# Patient Record
Sex: Female | Born: 1945
Health system: Southern US, Community
[De-identification: ages and names within clinical notes are randomized; demographics above are authoritative.]

## PROBLEM LIST (undated history)

## (undated) DIAGNOSIS — F32A Depression, unspecified: Secondary | ICD-10-CM

## (undated) DIAGNOSIS — H5440 Blindness, one eye, unspecified eye: Secondary | ICD-10-CM

## (undated) DIAGNOSIS — I10 Essential (primary) hypertension: Secondary | ICD-10-CM

## (undated) DIAGNOSIS — D649 Anemia, unspecified: Secondary | ICD-10-CM

## (undated) DIAGNOSIS — I6529 Occlusion and stenosis of unspecified carotid artery: Secondary | ICD-10-CM

## (undated) DIAGNOSIS — H269 Unspecified cataract: Secondary | ICD-10-CM

## (undated) DIAGNOSIS — I639 Cerebral infarction, unspecified: Secondary | ICD-10-CM

## (undated) DIAGNOSIS — I469 Cardiac arrest, cause unspecified: Secondary | ICD-10-CM

## (undated) DIAGNOSIS — E119 Type 2 diabetes mellitus without complications: Secondary | ICD-10-CM

## (undated) DIAGNOSIS — R0902 Hypoxemia: Secondary | ICD-10-CM

## (undated) DIAGNOSIS — E785 Hyperlipidemia, unspecified: Secondary | ICD-10-CM

## (undated) DIAGNOSIS — T7840XA Allergy, unspecified, initial encounter: Secondary | ICD-10-CM

## (undated) HISTORY — DX: Depression, unspecified: F32.A

## (undated) HISTORY — DX: Unspecified cataract: H26.9

## (undated) HISTORY — PX: ABDOMINAL HYSTERECTOMY: SHX81

## (undated) HISTORY — DX: Occlusion and stenosis of unspecified carotid artery: I65.29

## (undated) HISTORY — DX: Allergy, unspecified, initial encounter: T78.40XA

## (undated) HISTORY — DX: Hypoxemia: R09.02

## (undated) HISTORY — DX: Hyperlipidemia, unspecified: E78.5

## (undated) HISTORY — DX: Anemia, unspecified: D64.9

## (undated) NOTE — *Deleted (*Deleted)
Patient alert, oriented, denies pain or discomfort, daughter at bedside,,patient anticipates surgical procedure on 12/02/19, per chart Left carotid Endarectomy

## (undated) NOTE — *Deleted (*Deleted)
Prior to transfer to OR report given to [facility], CRNA

## (undated) NOTE — *Deleted (*Deleted)
Notified by Pricilla Handler, pre update information prior to surgical tech arrival

---

## 2002-03-14 ENCOUNTER — Emergency Department (HOSPITAL_COMMUNITY): Admission: EM | Admit: 2002-03-14 | Discharge: 2002-03-14 | Payer: Self-pay | Admitting: Emergency Medicine

## 2002-03-15 ENCOUNTER — Emergency Department (HOSPITAL_COMMUNITY): Admission: EM | Admit: 2002-03-15 | Discharge: 2002-03-15 | Payer: Self-pay | Admitting: Emergency Medicine

## 2005-05-06 ENCOUNTER — Ambulatory Visit: Payer: Self-pay | Admitting: Cardiovascular Disease

## 2005-06-04 ENCOUNTER — Other Ambulatory Visit: Admission: RE | Admit: 2005-06-04 | Discharge: 2005-06-04 | Payer: Self-pay | Admitting: Family Medicine

## 2005-09-24 ENCOUNTER — Emergency Department (HOSPITAL_COMMUNITY): Admission: EM | Admit: 2005-09-24 | Discharge: 2005-09-24 | Payer: Self-pay | Admitting: Emergency Medicine

## 2008-09-17 ENCOUNTER — Emergency Department (HOSPITAL_COMMUNITY): Admission: EM | Admit: 2008-09-17 | Discharge: 2008-09-17 | Payer: Self-pay | Admitting: Emergency Medicine

## 2010-04-17 LAB — URINALYSIS, ROUTINE W REFLEX MICROSCOPIC
Bilirubin Urine: NEGATIVE
Bilirubin Urine: NEGATIVE
Glucose, UA: 250 mg/dL — AB
Glucose, UA: 250 mg/dL — AB
Hgb urine dipstick: NEGATIVE
Hgb urine dipstick: NEGATIVE
Ketones, ur: NEGATIVE mg/dL
Ketones, ur: NEGATIVE mg/dL
Nitrite: NEGATIVE
Nitrite: NEGATIVE
Protein, ur: NEGATIVE mg/dL
Protein, ur: NEGATIVE mg/dL
Specific Gravity, Urine: 1.01 (ref 1.005–1.030)
Specific Gravity, Urine: 1.012 (ref 1.005–1.030)
Urobilinogen, UA: 0.2 mg/dL (ref 0.0–1.0)
Urobilinogen, UA: 0.2 mg/dL (ref 0.0–1.0)
pH: 5.5 (ref 5.0–8.0)
pH: 6 (ref 5.0–8.0)

## 2010-04-17 LAB — CBC
HCT: 39.8 % (ref 36.0–46.0)
Hemoglobin: 13.8 g/dL (ref 12.0–15.0)
MCHC: 34.8 g/dL (ref 30.0–36.0)
MCV: 95.6 fL (ref 78.0–100.0)
Platelets: 274 10*3/uL (ref 150–400)
RBC: 4.16 MIL/uL (ref 3.87–5.11)
RDW: 12.8 % (ref 11.5–15.5)
WBC: 16.9 10*3/uL — ABNORMAL HIGH (ref 4.0–10.5)

## 2010-04-17 LAB — BASIC METABOLIC PANEL
BUN: 9 mg/dL (ref 6–23)
CO2: 27 mEq/L (ref 19–32)
Calcium: 9.2 mg/dL (ref 8.4–10.5)
Chloride: 105 mEq/L (ref 96–112)
Creatinine, Ser: 0.73 mg/dL (ref 0.4–1.2)
GFR calc Af Amer: >60 mL/min (ref >60)
GFR calc non Af Amer: >60 mL/min (ref >60)
Glucose, Bld: 227 mg/dL — ABNORMAL HIGH (ref 70–99)
Potassium: 4.3 mEq/L (ref 3.5–5.1)
Sodium: 138 mEq/L (ref 135–145)

## 2010-04-17 LAB — URINE CULTURE
Colony Count: NO GROWTH
Culture: NO GROWTH

## 2010-04-17 LAB — URINE MICROSCOPIC-ADD ON

## 2010-04-17 LAB — DIFFERENTIAL
Basophils Absolute: 0 10*3/uL (ref 0.0–0.1)
Basophils Relative: 0 % (ref 0–1)
Eosinophils Absolute: 0.1 10*3/uL (ref 0.0–0.7)
Eosinophils Relative: 1 % (ref 0–5)
Lymphocytes Relative: 13 % (ref 12–46)
Lymphs Abs: 2.1 10*3/uL (ref 0.7–4.0)
Monocytes Absolute: 0.4 10*3/uL (ref 0.1–1.0)
Monocytes Relative: 3 % (ref 3–12)
Neutro Abs: 14.2 10*3/uL — ABNORMAL HIGH (ref 1.7–7.7)
Neutrophils Relative %: 84 % — ABNORMAL HIGH (ref 43–77)

## 2018-08-11 ENCOUNTER — Other Ambulatory Visit: Payer: Self-pay

## 2018-11-13 DIAGNOSIS — M9902 Segmental and somatic dysfunction of thoracic region: Secondary | ICD-10-CM | POA: Diagnosis not present

## 2018-11-13 DIAGNOSIS — M62830 Muscle spasm of back: Secondary | ICD-10-CM | POA: Diagnosis not present

## 2018-11-14 DIAGNOSIS — M9902 Segmental and somatic dysfunction of thoracic region: Secondary | ICD-10-CM | POA: Diagnosis not present

## 2018-11-14 DIAGNOSIS — M62830 Muscle spasm of back: Secondary | ICD-10-CM | POA: Diagnosis not present

## 2018-11-15 DIAGNOSIS — M62830 Muscle spasm of back: Secondary | ICD-10-CM | POA: Diagnosis not present

## 2018-11-15 DIAGNOSIS — M9902 Segmental and somatic dysfunction of thoracic region: Secondary | ICD-10-CM | POA: Diagnosis not present

## 2018-11-16 DIAGNOSIS — M9902 Segmental and somatic dysfunction of thoracic region: Secondary | ICD-10-CM | POA: Diagnosis not present

## 2018-11-16 DIAGNOSIS — M62830 Muscle spasm of back: Secondary | ICD-10-CM | POA: Diagnosis not present

## 2018-11-20 DIAGNOSIS — M62830 Muscle spasm of back: Secondary | ICD-10-CM | POA: Diagnosis not present

## 2018-11-20 DIAGNOSIS — M9902 Segmental and somatic dysfunction of thoracic region: Secondary | ICD-10-CM | POA: Diagnosis not present

## 2018-11-22 DIAGNOSIS — M9902 Segmental and somatic dysfunction of thoracic region: Secondary | ICD-10-CM | POA: Diagnosis not present

## 2018-11-22 DIAGNOSIS — M62830 Muscle spasm of back: Secondary | ICD-10-CM | POA: Diagnosis not present

## 2018-11-23 DIAGNOSIS — M62830 Muscle spasm of back: Secondary | ICD-10-CM | POA: Diagnosis not present

## 2018-11-23 DIAGNOSIS — M9902 Segmental and somatic dysfunction of thoracic region: Secondary | ICD-10-CM | POA: Diagnosis not present

## 2018-11-27 DIAGNOSIS — M9902 Segmental and somatic dysfunction of thoracic region: Secondary | ICD-10-CM | POA: Diagnosis not present

## 2018-11-27 DIAGNOSIS — M62830 Muscle spasm of back: Secondary | ICD-10-CM | POA: Diagnosis not present

## 2018-11-29 DIAGNOSIS — M9902 Segmental and somatic dysfunction of thoracic region: Secondary | ICD-10-CM | POA: Diagnosis not present

## 2018-11-29 DIAGNOSIS — M62830 Muscle spasm of back: Secondary | ICD-10-CM | POA: Diagnosis not present

## 2018-11-30 DIAGNOSIS — M9902 Segmental and somatic dysfunction of thoracic region: Secondary | ICD-10-CM | POA: Diagnosis not present

## 2018-11-30 DIAGNOSIS — M62830 Muscle spasm of back: Secondary | ICD-10-CM | POA: Diagnosis not present

## 2018-12-04 DIAGNOSIS — M9902 Segmental and somatic dysfunction of thoracic region: Secondary | ICD-10-CM | POA: Diagnosis not present

## 2018-12-04 DIAGNOSIS — M62830 Muscle spasm of back: Secondary | ICD-10-CM | POA: Diagnosis not present

## 2018-12-06 DIAGNOSIS — M9902 Segmental and somatic dysfunction of thoracic region: Secondary | ICD-10-CM | POA: Diagnosis not present

## 2018-12-06 DIAGNOSIS — M62830 Muscle spasm of back: Secondary | ICD-10-CM | POA: Diagnosis not present

## 2018-12-11 DIAGNOSIS — M62830 Muscle spasm of back: Secondary | ICD-10-CM | POA: Diagnosis not present

## 2018-12-11 DIAGNOSIS — M9902 Segmental and somatic dysfunction of thoracic region: Secondary | ICD-10-CM | POA: Diagnosis not present

## 2018-12-14 DIAGNOSIS — M62830 Muscle spasm of back: Secondary | ICD-10-CM | POA: Diagnosis not present

## 2018-12-14 DIAGNOSIS — M9902 Segmental and somatic dysfunction of thoracic region: Secondary | ICD-10-CM | POA: Diagnosis not present

## 2018-12-19 DIAGNOSIS — M62830 Muscle spasm of back: Secondary | ICD-10-CM | POA: Diagnosis not present

## 2018-12-19 DIAGNOSIS — M9902 Segmental and somatic dysfunction of thoracic region: Secondary | ICD-10-CM | POA: Diagnosis not present

## 2018-12-25 DIAGNOSIS — M9902 Segmental and somatic dysfunction of thoracic region: Secondary | ICD-10-CM | POA: Diagnosis not present

## 2018-12-25 DIAGNOSIS — M62830 Muscle spasm of back: Secondary | ICD-10-CM | POA: Diagnosis not present

## 2019-01-01 DIAGNOSIS — M9902 Segmental and somatic dysfunction of thoracic region: Secondary | ICD-10-CM | POA: Diagnosis not present

## 2019-01-01 DIAGNOSIS — M62830 Muscle spasm of back: Secondary | ICD-10-CM | POA: Diagnosis not present

## 2019-01-09 DIAGNOSIS — M62830 Muscle spasm of back: Secondary | ICD-10-CM | POA: Diagnosis not present

## 2019-01-09 DIAGNOSIS — M9902 Segmental and somatic dysfunction of thoracic region: Secondary | ICD-10-CM | POA: Diagnosis not present

## 2019-01-16 DIAGNOSIS — M9902 Segmental and somatic dysfunction of thoracic region: Secondary | ICD-10-CM | POA: Diagnosis not present

## 2019-01-16 DIAGNOSIS — M62830 Muscle spasm of back: Secondary | ICD-10-CM | POA: Diagnosis not present

## 2019-01-23 DIAGNOSIS — M9902 Segmental and somatic dysfunction of thoracic region: Secondary | ICD-10-CM | POA: Diagnosis not present

## 2019-01-23 DIAGNOSIS — M62830 Muscle spasm of back: Secondary | ICD-10-CM | POA: Diagnosis not present

## 2019-03-22 DIAGNOSIS — M25561 Pain in right knee: Secondary | ICD-10-CM | POA: Diagnosis not present

## 2019-03-22 DIAGNOSIS — M545 Low back pain: Secondary | ICD-10-CM | POA: Diagnosis not present

## 2019-03-22 DIAGNOSIS — M25551 Pain in right hip: Secondary | ICD-10-CM | POA: Diagnosis not present

## 2019-04-23 DIAGNOSIS — M25561 Pain in right knee: Secondary | ICD-10-CM | POA: Diagnosis not present

## 2019-05-02 ENCOUNTER — Encounter: Payer: Self-pay | Admitting: Physical Therapy

## 2019-05-02 ENCOUNTER — Other Ambulatory Visit: Payer: Self-pay

## 2019-05-02 ENCOUNTER — Ambulatory Visit: Payer: PPO | Attending: Orthopedic Surgery | Admitting: Physical Therapy

## 2019-05-02 DIAGNOSIS — R2681 Unsteadiness on feet: Secondary | ICD-10-CM | POA: Diagnosis not present

## 2019-05-02 DIAGNOSIS — M6281 Muscle weakness (generalized): Secondary | ICD-10-CM

## 2019-05-02 NOTE — Therapy (Signed)
[facility] [place], [place], [address] Phone: [phone]   Fax:  [phone]  Physical Therapy Evaluation  Patient Details  Name: Chelsea [name] MRN: [id number] Date of Birth: Jul 27, 1945 Referring Provider (PT): Tania Ade, MD   Encounter Date: 05/02/2019  PT End of Session - 05/02/19 1159    Visit Number  1    Number of Visits  12    Date for PT Re-Evaluation  06/20/19    PT Start Time  0900    PT Stop Time  0946    PT Time Calculation (min)  46 min    Activity Tolerance  Patient tolerated treatment well    Behavior During Therapy  [facility] for tasks assessed/performed       History reviewed. No pertinent past medical history.  History reviewed. No pertinent surgical history.  There were no vitals filed for this visit.   Subjective Assessment - 05/02/19 1156    Subjective  COVID-19 screening performed upon arrival.Patient arrives to physical therapy with reports of right leg weakness and instability where she feels like she may fall that began in March 2021. Patient reports the ability to perform ADLs independently but takes rest breaks as needed throughout the day. Patient states her knee starts to turn inward when she feels like her leg becomes weak. Patient denies pain. Patient's goals are to improve strength, improve motion, improve ability to perform ADLs and return to driving.    Pertinent History  n/a    Limitations  Standing;Walking;House hold activities    Diagnostic tests  x-ray: normal    Currently in Pain?  No/denies         [facility] PT Assessment - 05/02/19 0001      Assessment   Medical Diagnosis  Right leg weakness    Referring Provider (PT)  Tania Ade, MD    Onset Date/Surgical Date  --   March 2021q   Next MD Visit  none    Prior Therapy  no      Precautions   Precautions  None      Restrictions   Weight Bearing Restrictions  No      Balance Screen   Has the patient fallen in  the past 6 months  No    Has the patient had a decrease in activity level because of a fear of falling?   Yes    Is the patient reluctant to leave their home because of a fear of falling?   Yes      [place] residence    Living Arrangements  Children      Prior Function   Level of Independence  Independent      Observation/Other Assessments   Observations  right knee with dyamic valgus in standing      ROM / Strength   AROM / PROM / Strength  Strength      Strength   Strength Assessment Site  Hip;Knee    Right/Left Hip  Right;Left    Right Hip Flexion  2+/5    Right Hip Extension  2+/5    Right Hip External Rotation   3-/5    Right Hip Internal Rotation  3-/5    Right Hip ABduction  2+/5    Left Hip Flexion  3+/5    Left Hip Extension  3+/5    Left Hip ABduction  3+/5    Right/Left Knee  Right;Left    Right Knee  Flexion  3-/5    Right Knee Extension  3+/5    Left Knee Flexion  4/5    Left Knee Extension  4+/5      Transfers   Comments  able to perform transfers independently and no pain      Ambulation/Gait   Gait Pattern  Step-through pattern;Decreased step length - left;Decreased stance time - right;Decreased arm swing - left;Decreased stride length;Decreased weight shift to right;Lateral hip instability;Trendelenburg;Trunk flexed      Balance   Balance Assessed  Yes      Standardized Balance Assessment   Standardized Balance Assessment  Five Times Sit to Stand    Five times sit to stand comments   17.31 seconds with UE support on thighs                Objective measurements completed on examination: See above findings.              PT Education - 05/02/19 1158    Education Details  clam shells, marching, bridges, standing marching, pacing and control of movement    Person(s) Educated  Patient    Methods  Explanation;Demonstration;Handout    Comprehension  Verbalized understanding;Returned demonstration           PT Long Term Goals - 05/02/19 1221      PT LONG TERM GOAL #1   Title  Patient will be independent with HEP    Time  6    Period  Weeks    Status  New      PT LONG TERM GOAL #2   Title  Patient will demonstrate 4/5 or greater right hip and right knee MMT in all planes to improve stability during functional tasks.    Time  6    Period  Weeks    Status  New      PT LONG TERM GOAL #3   Title  Patient will improve functional LE strength as noted by the ability to perform modified 5x sit to stand in 14 seconds or less.    Time  6    Period  Weeks    Status  New             Plan - 05/02/19 1217    Clinical Impression Statement  Patient is a 74 year old female who presents to physical therapy with her daughter with right LE weakness. Patient's 5x sit to stand with UE support on the thighs of 17.31 seconds categorizes her as a fall risk with decreased functional LE strength. Patient denied any tenderness upon palpation to the right hip or right knee. Patient ambulates with decreased right stance time, decreased right weight shifting, and Trendelenburg gait. Patient and PT discussed plan of care and discussed HEP to address deficits. Patient reported understanding. Patient would benefit from skilled physical therapy to address deficits and address patient's goals.    Personal Factors and Comorbidities  Age;Time since onset of injury/illness/exacerbation    Examination-Activity Limitations  Stand;Transfers;Carry    Examination-Participation Restrictions  Driving    Stability/Clinical Decision Making  Stable/Uncomplicated    Clinical Decision Making  Low    Rehab Potential  Good    PT Frequency  2x / week    PT Duration  6 weeks    PT Treatment/Interventions  ADLs/Self Care Home Management;Moist Heat;Cryotherapy;Gait training;Stair training;Functional mobility training;Therapeutic activities;Therapeutic exercise;Balance training;Neuromuscular re-education;Manual  techniques;Passive range of motion;Patient/family education    PT Next Visit Plan  nustep, bilateral LE strengthening, core stability,  balance activities in sitting and standing.    PT Home Exercise Plan  see patient education section    Consulted and Agree with Plan of Care  Patient       Patient will benefit from skilled therapeutic intervention in order to improve the following deficits and impairments:  Decreased activity tolerance, Difficulty walking, Abnormal gait, Decreased balance, Decreased strength  Visit Diagnosis: Muscle weakness (generalized)  Unsteadiness on feet     Problem List There are no problems to display for this patient.   Guss Bunde, PT, DPT 05/02/2019, 12:25 PM  [facility] [address] [place], [place], [address] Phone: [phone]   Fax:  [phone]  Name: Chelsea [name] MRN: [id number] Date of Birth: August 12, 1945

## 2019-05-04 ENCOUNTER — Other Ambulatory Visit: Payer: Self-pay

## 2019-05-04 ENCOUNTER — Ambulatory Visit: Payer: PPO | Admitting: *Deleted

## 2019-05-04 DIAGNOSIS — M6281 Muscle weakness (generalized): Secondary | ICD-10-CM | POA: Diagnosis not present

## 2019-05-04 DIAGNOSIS — R2681 Unsteadiness on feet: Secondary | ICD-10-CM

## 2019-05-04 NOTE — Therapy (Signed)
[facility] [address] [place], [place], [address] Phone: [phone]   Fax:  [phone]  Physical Therapy Treatment  Patient Details  Name: LAQUILLA DAULT MRN: [id number] Date of Birth: 1945-02-14 Referring Provider (PT): Jones Broom, MD   Encounter Date: 05/04/2019  PT End of Session - 05/04/19 0952    Visit Number  2    Number of Visits  12    Date for PT Re-Evaluation  06/20/19    PT Start Time  0945    PT Stop Time  1035    PT Time Calculation (min)  50 min       No past medical history on file.  No past surgical history on file.  There were no vitals filed for this visit.  Subjective Assessment - 05/04/19 0950    Subjective  COVID-19 screening performed upon arrival. Doing ok today. RT leg is weak    Pertinent History  n/a    Limitations  Standing;Walking;House hold activities    Diagnostic tests  x-ray: normal    Currently in Pain?  No/denies                       [facility] Adult PT Treatment/Exercise - 05/04/19 0001      Exercises   Exercises  Knee/Hip      Knee/Hip Exercises: Aerobic   Nustep  x 15 mins L3 UE/LE       Knee/Hip Exercises: Standing   Terminal Knee Extension  Strengthening;Right;2 sets;10 reps;Theraband   Red cues for control   Rocker Board  3 minutes    Other Standing Knee Exercises  6in step toe taps for balance/wt shift 3x10 with CGA      Knee/Hip Exercises: Seated   Long Arc Quad  Strengthening;Both;2 sets;10 reps    Long Arc Quad Weight  2 lbs.    Sit to Sand  2 sets;10 reps;with UE support   cues for technique and controlled descent                 PT Long Term Goals - 05/02/19 1221      PT LONG TERM GOAL #1   Title  Patient will be independent with HEP    Time  6    Period  Weeks    Status  New      PT LONG TERM GOAL #2   Title  Patient will demonstrate 4/5 or greater right hip and right knee MMT in all planes to improve stability during  functional tasks.    Time  6    Period  Weeks    Status  New      PT LONG TERM GOAL #3   Title  Patient will improve functional LE strength as noted by the ability to perform modified 5x sit to stand in 14 seconds or less.    Time  6    Period  Weeks    Status  New            Plan - 05/04/19 1253    Clinical Impression Statement  Pt arrived today in good spirits and was able to perform LE  strengthening exs in sitting and standing without complaints. She has notable genu-recurvatum on RT knee during some act.'s. She did well with TKR using red tband with verbal and tactile cues.    Personal Factors and Comorbidities  Age;Time since onset of injury/illness/exacerbation    Examination-Activity Limitations  Stand;Transfers;Carry  Examination-Participation Restrictions  Driving    Stability/Clinical Decision Making  Stable/Uncomplicated    Rehab Potential  Good    PT Frequency  2x / week    PT Duration  6 weeks    PT Treatment/Interventions  ADLs/Self Care Home Management;Moist Heat;Cryotherapy;Gait training;Stair training;Functional mobility training;Therapeutic activities;Therapeutic exercise;Balance training;Neuromuscular re-education;Manual techniques;Passive range of motion;Patient/family education    PT Next Visit Plan  nustep, bilateral LE strengthening, core stability, balance activities in sitting and standing.       Patient will benefit from skilled therapeutic intervention in order to improve the following deficits and impairments:  Decreased activity tolerance, Difficulty walking, Abnormal gait, Decreased balance, Decreased strength  Visit Diagnosis: Muscle weakness (generalized)  Unsteadiness on feet     Problem List There are no problems to display for this patient.   Avagrace Botelho,CHRIS, PTA 05/04/2019, 12:58 PM  [facility] [address] [place], [place], [address] Phone: [phone]   Fax:  [phone]  Name:  SUHEY RADFORD MRN: [id number] Date of Birth: [birth year]

## 2019-05-07 ENCOUNTER — Ambulatory Visit: Payer: PPO | Admitting: Physical Therapy

## 2019-05-07 ENCOUNTER — Other Ambulatory Visit: Payer: Self-pay

## 2019-05-07 ENCOUNTER — Encounter: Payer: Self-pay | Admitting: Physical Therapy

## 2019-05-07 DIAGNOSIS — R2681 Unsteadiness on feet: Secondary | ICD-10-CM

## 2019-05-07 DIAGNOSIS — M6281 Muscle weakness (generalized): Secondary | ICD-10-CM | POA: Diagnosis not present

## 2019-05-07 NOTE — Therapy (Signed)
[facility] [address] [place], [place], [address] Phone: [phone]   Fax:  [phone]  Physical Therapy Treatment  Patient Details  Name: Chelsea [name] MRN: [id number] Date of Birth: 1945-06-30 Referring Provider (PT): Jones Broom, MD   Encounter Date: 05/07/2019  PT End of Session - 05/07/19 1212    Visit Number  3    Number of Visits  12    Date for PT Re-Evaluation  06/20/19    PT Start Time  1200    PT Stop Time  1244    PT Time Calculation (min)  44 min    Activity Tolerance  Patient tolerated treatment well    Behavior During Therapy  [facility] for tasks assessed/performed       History reviewed. No pertinent past medical history.  History reviewed. No pertinent surgical history.  There were no vitals filed for this visit.  Subjective Assessment - 05/07/19 1211    Subjective  COVID-19 screening performed upon arrival. Patient reported feeling sore after last session but overall is doing well.    Pertinent History  n/a    Limitations  Standing;Walking;House hold activities    Diagnostic tests  x-ray: normal    Currently in Pain?  No/denies         [facility] PT Assessment - 05/07/19 0001      Assessment   Medical Diagnosis  Right leg weakness    Referring Provider (PT)  Jones Broom, MD    Next MD Visit  none    Prior Therapy  no      Precautions   Precautions  None                   [facility] Adult PT Treatment/Exercise - 05/07/19 0001      Exercises   Exercises  Knee/Hip      Knee/Hip Exercises: Aerobic   Nustep  x 15 mins L3 UE/LE       Knee/Hip Exercises: Standing   Functional Squat  2 sets;10 reps    Rocker Board  3 minutes    Other Standing Knee Exercises  6in step toe taps for balance/wt shift 3x10 with CGA      Knee/Hip Exercises: Seated   Long Arc Quad  Strengthening;Both;2 sets;10 reps    Long Arc Quad Weight  2 lbs.      Knee/Hip Exercises: Supine   Short Arc Quad Sets   Strengthening;Both;2 sets;10 reps    Bridges with [employer]  Strengthening;Both;10 reps    Bridges with Clamshell  Strengthening;1 set;10 reps   yellow theraband   Straight Leg Raises  Both;2 sets;10 reps                  PT Long Term Goals - 05/02/19 1221      PT LONG TERM GOAL #1   Title  Patient will be independent with HEP    Time  6    Period  Weeks    Status  New      PT LONG TERM GOAL #2   Title  Patient will demonstrate 4/5 or greater right hip and right knee MMT in all planes to improve stability during functional tasks.    Time  6    Period  Weeks    Status  New      PT LONG TERM GOAL #3   Title  Patient will improve functional LE strength as noted by the ability to perform modified 5x sit to  stand in 14 seconds or less.    Time  6    Period  Weeks    Status  New            Plan - 05/07/19 1235    Clinical Impression Statement  Patient responded well to therapy session with minimal complaints of pian. Patient required verbal cuing for form at start of exercise with strong carryover for remaining reps. Patient noted with hesitancy with 6" step taps with notable trunk extension during right hip flexion when performing without UE support. Patient reported a fear of falling; no falls reported.    Personal Factors and Comorbidities  Age;Time since onset of injury/illness/exacerbation    Examination-Activity Limitations  Stand;Transfers;Carry    Examination-Participation Restrictions  Driving    Stability/Clinical Decision Making  Stable/Uncomplicated    Clinical Decision Making  Low    PT Next Visit Plan  nustep, bilateral LE strengthening, core stability, balance activities in sitting and standing.    PT Home Exercise Plan  see patient education section    Consulted and Agree with Plan of Care  Patient       Patient will benefit from skilled therapeutic intervention in order to improve the following deficits and impairments:  Decreased activity  tolerance, Difficulty walking, Abnormal gait, Decreased balance, Decreased strength  Visit Diagnosis: Muscle weakness (generalized)  Unsteadiness on feet     Problem List There are no problems to display for this patient.   Gabriela Eves, PT, DPT 05/07/2019, 12:59 PM  [facility] [place], [place], [address] Phone: [phone]   Fax:  [phone]  Name: Chelsea [name] MRN: [id number] Date of Birth: 02-25-1945

## 2019-05-10 ENCOUNTER — Encounter: Payer: PPO | Admitting: Physical Therapy

## 2019-05-14 ENCOUNTER — Other Ambulatory Visit: Payer: Self-pay

## 2019-05-14 ENCOUNTER — Ambulatory Visit: Payer: PPO | Attending: Orthopedic Surgery

## 2019-05-14 DIAGNOSIS — M6281 Muscle weakness (generalized): Secondary | ICD-10-CM | POA: Insufficient documentation

## 2019-05-14 DIAGNOSIS — R2681 Unsteadiness on feet: Secondary | ICD-10-CM | POA: Diagnosis not present

## 2019-05-14 NOTE — Therapy (Signed)
[facility] [place], [place], [address] Phone: [phone]   Fax:  [phone]  Physical Therapy Treatment  Patient Details  Name: LASHE OLIVEIRA MRN: [id number] Date of Birth: [birth year] Referring Provider (PT): Tania Ade, MD   Encounter Date: 05/14/2019  PT End of Session - 05/14/19 1128    Visit Number  4    Number of Visits  12    Date for PT Re-Evaluation  06/20/19    PT Start Time  1124   pt late for apt   PT Stop Time  1210    PT Time Calculation (min)  46 min    Activity Tolerance  Patient tolerated treatment well    Behavior During Therapy  [facility] for tasks assessed/performed       History reviewed. No pertinent past medical history.  History reviewed. No pertinent surgical history.  There were no vitals filed for this visit.  Subjective Assessment - 05/14/19 1126    Subjective  COVID-19 screening performed upon arrival. Pt arrived wiht daughter, reports she was pretty sore following last session.  No reports of pain currently.  Reports she has been compliant with HEP daily.    Currently in Pain?  No/denies                       [facility] Adult PT Treatment/Exercise - 05/14/19 0001      Exercises   Exercises  Knee/Hip      Knee/Hip Exercises: Aerobic   Nustep  8 min L4 UE/LE      Knee/Hip Exercises: Standing   Heel Raises  15 reps    Knee Flexion  Both;10 reps    Knee Flexion Limitations  2#, cueing for form/mechanics    Hip Abduction  Both;10 reps;Knee straight    Abduction Limitations  theraband around knees, cueing to reduce ER    Forward Step Up  Both;10 reps;Hand Hold: 2;Step Height: 6"    Functional Squat  15 reps    Functional Squat Limitations  front of chair for mechanics    Other Standing Knee Exercises  6in step toe taps for balance/wt shift 3x10 with CGA    Other Standing Knee Exercises  sidestep theraband above knees; tandem stance      Knee/Hip Exercises: Seated   Long Arc Quad  Strengthening;Both;2 sets;10 reps    Long Arc Quad Weight  3 lbs.    Sit to Sand  2 sets;10 reps;without UE support   elevated 23 in, eccenctric control                 PT Long Term Goals - 05/02/19 1221      PT LONG TERM GOAL #1   Title  Patient will be independent with HEP    Time  6    Period  Weeks    Status  New      PT LONG TERM GOAL #2   Title  Patient will demonstrate 4/5 or greater right hip and right knee MMT in all planes to improve stability during functional tasks.    Time  6    Period  Weeks    Status  New      PT LONG TERM GOAL #3   Title  Patient will improve functional LE strength as noted by the ability to perform modified 5x sit to stand in 14 seconds or less.    Time  6    Period  Weeks  Status  New            Plan - 05/14/19 1254    Clinical Impression Statement  Pt progressing well towards POC.  Additional exercises added for LE strengthening and balance activities.  Most difficulty with tandem stance required HHA due to LOB in // bars.  Added weight with standing hamstring curls, step ups and balance training with theraband for hip strengthening.  No reports of pain through session, was limited by fatigue.    Personal Factors and Comorbidities  Age;Time since onset of injury/illness/exacerbation    Examination-Activity Limitations  Stand;Transfers;Carry    Examination-Participation Restrictions  Driving    Stability/Clinical Decision Making  Stable/Uncomplicated    Clinical Decision Making  Low    Rehab Potential  Good    PT Frequency  2x / week    PT Duration  6 weeks    PT Treatment/Interventions  ADLs/Self Care Home Management;Moist Heat;Cryotherapy;Gait training;Stair training;Functional mobility training;Therapeutic activities;Therapeutic exercise;Balance training;Neuromuscular re-education;Manual techniques;Passive range of motion;Patient/family education    PT Next Visit Plan  nustep, bilateral LE strengthening,  core stability, balance activities in sitting and standing.    PT Home Exercise Plan  see patient education section       Patient will benefit from skilled therapeutic intervention in order to improve the following deficits and impairments:  Decreased activity tolerance, Difficulty walking, Abnormal gait, Decreased balance, Decreased strength  Visit Diagnosis: Unsteadiness on feet  Muscle weakness (generalized)     Problem List There are no problems to display for this patient. Becky Sax, LPTA/CLT; CBIS [phone]  Juel Burrow 05/14/2019, 3:04 PM  [facility] [address] [place], [place], [address] Phone: [phone]   Fax:  [phone]  Name: IVANIA TEAGARDEN MRN: [id number] Date of Birth: [birth year]

## 2019-05-16 ENCOUNTER — Other Ambulatory Visit: Payer: Self-pay

## 2019-05-16 ENCOUNTER — Ambulatory Visit: Payer: PPO | Admitting: Physical Therapy

## 2019-05-16 ENCOUNTER — Encounter: Payer: Self-pay | Admitting: Physical Therapy

## 2019-05-16 DIAGNOSIS — R2681 Unsteadiness on feet: Secondary | ICD-10-CM | POA: Diagnosis not present

## 2019-05-16 DIAGNOSIS — M6281 Muscle weakness (generalized): Secondary | ICD-10-CM

## 2019-05-16 NOTE — Therapy (Signed)
[facility] [place], [place], [address] Phone: [phone]   Fax:  [phone]  Physical Therapy Treatment  Patient Details  Name: Chelsea [name] MRN: [id number] Date of Birth: 1945-08-14 Referring Provider (PT): Tania Ade, MD   Encounter Date: 05/16/2019  PT End of Session - 05/16/19 0856    Visit Number  5    Number of Visits  12    Date for PT Re-Evaluation  06/20/19    PT Start Time  0815    PT Stop Time  0856    PT Time Calculation (min)  41 min    Activity Tolerance  Patient tolerated treatment well    Behavior During Therapy  [facility] for tasks assessed/performed       History reviewed. No pertinent past medical history.  History reviewed. No pertinent surgical history.  There were no vitals filed for this visit.  Subjective Assessment - 05/16/19 0826    Subjective  COVID-19 screening performed upon arrival. Pt arrived with no new complaints    Pertinent History  n/a    Limitations  Standing;Walking;House hold activities    Diagnostic tests  x-ray: normal    Currently in Pain?  No/denies                       [facility] Adult PT Treatment/Exercise - 05/16/19 0001      Knee/Hip Exercises: Aerobic   Nustep  35min L4 UE/LE      Knee/Hip Exercises: Seated   Long Arc Quad  Strengthening;Both;10 reps;3 sets    [employer] Weight  3 lbs.    Sit to Sand  2 sets;10 reps;without UE support   elevated surface with Right leg slight back     Knee/Hip Exercises: Supine   Short Arc Quad Sets  Strengthening;Right;20 reps;3 sets    Short Arc Quad Sets Limitations  3# with ball squeeze for VMO activation    Bridges with Clamshell  Strengthening;Both;2 sets;10 reps   red t-band   Straight Leg Raise with External Rotation  Strengthening;Right;10 reps;1 set      Knee/Hip Exercises: Sidelying   Hip ABduction  Strengthening;Right;2 sets;10 reps    Hip ABduction Limitations       Clams  x30                   PT Long Term Goals - 05/16/19 5732      PT LONG TERM GOAL #1   Title  Patient will be independent with HEP    Time  6    Period  Weeks    Status  On-going      PT LONG TERM GOAL #2   Title  Patient will demonstrate 4/5 or greater right hip and right knee MMT in all planes to improve stability during functional tasks.    Time  6    Period  Weeks    Status  On-going      PT LONG TERM GOAL #3   Title  Patient will improve functional LE strength as noted by the ability to perform modified 5x sit to stand in 14 seconds or less.    Time  6    Period  Weeks    Status  On-going            Plan - 05/16/19 0857    Clinical Impression Statement  Patient tolerated treatment well today. Patient able to progress with exercises today to improve strength in  right LE. Patient able to perform sidelying hip abd yet very weak and required educational cues for technique. Patient able to perform sit to stand at a good pace yet only at eleveted surface. Patient goals are progressing.    Personal Factors and Comorbidities  Age;Time since onset of injury/illness/exacerbation    Examination-Activity Limitations  Stand;Transfers;Carry    Examination-Participation Restrictions  Driving    Stability/Clinical Decision Making  Stable/Uncomplicated    Rehab Potential  Good    PT Frequency  2x / week    PT Duration  6 weeks    PT Treatment/Interventions  ADLs/Self Care Home Management;Moist Heat;Cryotherapy;Gait training;Stair training;Functional mobility training;Therapeutic activities;Therapeutic exercise;Balance training;Neuromuscular re-education;Manual techniques;Passive range of motion;Patient/family education    PT Next Visit Plan  cont with POC for bilateral LE strengthening, core stability, balance activities in sitting and standing.    Consulted and Agree with Plan of Care  Patient       Patient will benefit from skilled therapeutic intervention in order to improve the  following deficits and impairments:  Decreased activity tolerance, Difficulty walking, Abnormal gait, Decreased balance, Decreased strength  Visit Diagnosis: Unsteadiness on feet  Muscle weakness (generalized)     Problem List There are no problems to display for this patient.   Hermelinda Dellen, PTA 05/16/2019, 9:03 AM  [facility] [address] [place], [place], [address] Phone: [phone]   Fax:  [phone]  Name: JILENE SPOHR MRN: [id number] Date of Birth: November 01, 1945

## 2019-05-21 ENCOUNTER — Other Ambulatory Visit: Payer: Self-pay

## 2019-05-21 ENCOUNTER — Ambulatory Visit: Payer: PPO | Admitting: Physical Therapy

## 2019-05-21 ENCOUNTER — Encounter: Payer: Self-pay | Admitting: Physical Therapy

## 2019-05-21 DIAGNOSIS — R2681 Unsteadiness on feet: Secondary | ICD-10-CM | POA: Diagnosis not present

## 2019-05-21 DIAGNOSIS — M6281 Muscle weakness (generalized): Secondary | ICD-10-CM

## 2019-05-21 NOTE — Therapy (Signed)
[facility] [address] [place], [place], [address] Phone: [phone]   Fax:  [phone]  Physical Therapy Treatment  Patient Details  Name: Chelsea [name] MRN: [id number] Date of Birth: 1945-05-13 Referring Provider (PT): Jones Broom, MD   Encounter Date: 05/21/2019  PT End of Session - 05/21/19 0933    Visit Number  6    Number of Visits  12    Date for PT Re-Evaluation  06/20/19    PT Start Time  0901    PT Stop Time  0943    PT Time Calculation (min)  42 min       History reviewed. No pertinent past medical history.  History reviewed. No pertinent surgical history.  There were no vitals filed for this visit.  Subjective Assessment - 05/21/19 0912    Subjective  COVID-19 screening performed upon arrival. Patient arrived and reported doing well and no complaints after last treatment    Pertinent History  n/a    Limitations  Standing;Walking;House hold activities    Diagnostic tests  x-ray: normal    Currently in Pain?  No/denies         [facility] PT Assessment - 05/21/19 0001      Strength   Strength Assessment Site  Hip;Knee    Right/Left Hip  Right    Right Hip Flexion  3/5    Right Hip External Rotation   4/5    Right Hip Internal Rotation  4-/5    Right Hip ABduction  3-/5    Left Hip Flexion  4/5    Left Hip Extension  4+/5                   [facility] Adult PT Treatment/Exercise - 05/21/19 0001      Knee/Hip Exercises: Aerobic   Nustep  L4 UE/LE      Knee/Hip Exercises: Standing   Hip Abduction  Stengthening;Right;2 sets;10 reps;Knee straight    Abduction Limitations  yellow t-band    Forward Step Up  Both;10 reps;Hand Hold: 2;Step Height: 6";2 sets      Knee/Hip Exercises: Seated   Long Arc Quad  Strengthening;Both;10 reps;3 sets    [employer] Weight  3 lbs.    Hamstring Curl  Strengthening;Right;20 reps;10 reps    Hamstring Limitations  green t band    Sit to Sand  20  reps;without UE support      Knee/Hip Exercises: Supine   Short Arc Quad Sets  Strengthening;Right;20 reps;3 sets    Short Arc Quad Sets Limitations  3# with grey ball squeeze    Bridges with Clamshell  Strengthening;Both;2 sets;10 reps   red t-band   Straight Leg Raise with External Rotation  Strengthening;Right;10 reps;2 sets      Knee/Hip Exercises: Sidelying   Hip ABduction  Strengthening;Right;2 sets;10 reps    Clams  x30                  PT Long Term Goals - 05/21/19 9892      PT LONG TERM GOAL #1   Title  Patient will be independent with HEP    Time  6    Period  Weeks    Status  On-going      PT LONG TERM GOAL #2   Title  Patient will demonstrate 4/5 or greater right hip and right knee MMT in all planes to improve stability during functional tasks.    Time  6  Period  Weeks    Status  On-going      PT LONG TERM GOAL #3   Title  Patient will improve functional LE strength as noted by the ability to perform modified 5x sit to stand in 14 seconds or less.    Time  6    Period  Weeks    Status  On-going            Plan - 05/21/19 0941    Clinical Impression Statement  Patient tolerated treatment well today. Patient able to progress with all exercises with good technique. Patient did require verbal and visual education with hip exercises due to compensation due to weakness. Today patient has reported 75% improvement overall. Patient has improved strength on right LE yet not able to meet any further goals.    Personal Factors and Comorbidities  Age;Time since onset of injury/illness/exacerbation    Examination-Activity Limitations  Stand;Transfers;Carry    Examination-Participation Restrictions  Driving    Stability/Clinical Decision Making  Stable/Uncomplicated    Rehab Potential  Good    PT Frequency  2x / week    PT Duration  6 weeks    PT Treatment/Interventions  ADLs/Self Care Home Management;Moist Heat;Cryotherapy;Gait training;Stair  training;Functional mobility training;Therapeutic activities;Therapeutic exercise;Balance training;Neuromuscular re-education;Manual techniques;Passive range of motion;Patient/family education    PT Next Visit Plan  cont with POC for bilateral LE strengthening, core stability, balance activities in sitting and standing.    Consulted and Agree with Plan of Care  Patient       Patient will benefit from skilled therapeutic intervention in order to improve the following deficits and impairments:  Decreased activity tolerance, Difficulty walking, Abnormal gait, Decreased balance, Decreased strength  Visit Diagnosis: Unsteadiness on feet  Muscle weakness (generalized)     Problem List There are no problems to display for this patient.   Phillips Climes, PTA 05/21/2019, 9:43 AM  [facility] [place], [place], [address] Phone: [phone]   Fax:  [phone]  Name: Chelsea [name] MRN: [id number] Date of Birth: March 14, 1945

## 2019-05-23 ENCOUNTER — Other Ambulatory Visit: Payer: Self-pay

## 2019-05-23 ENCOUNTER — Ambulatory Visit: Payer: PPO | Admitting: Physical Therapy

## 2019-05-23 ENCOUNTER — Encounter: Payer: Self-pay | Admitting: Physical Therapy

## 2019-05-23 DIAGNOSIS — M6281 Muscle weakness (generalized): Secondary | ICD-10-CM

## 2019-05-23 DIAGNOSIS — R2681 Unsteadiness on feet: Secondary | ICD-10-CM | POA: Diagnosis not present

## 2019-05-23 NOTE — Therapy (Signed)
[facility] [place], [place], [address] Phone: [phone]   Fax:  [phone]  Physical Therapy Treatment  Patient Details  Name: Chelsea [name] MRN: [id number] Date of Birth: 04/10/1945 Referring Provider (PT): Tania Ade, MD   Encounter Date: 05/23/2019  PT End of Session - 05/23/19 0840    Visit Number  7    Number of Visits  12    Date for PT Re-Evaluation  06/20/19    PT Start Time  0815    PT Stop Time  0856    PT Time Calculation (min)  41 min    Activity Tolerance  Patient tolerated treatment well    Behavior During Therapy  [facility] for tasks assessed/performed       History reviewed. No pertinent past medical history.  History reviewed. No pertinent surgical history.  There were no vitals filed for this visit.  Subjective Assessment - 05/23/19 0828    Subjective  COVID-19 screening performed upon arrival. Patient arrived and reported no pain today, although a little sore after last visit    Pertinent History  n/a    Limitations  Standing;Walking;House hold activities    Diagnostic tests  x-ray: normal    Currently in Pain?  No/denies                       [facility] Adult PT Treatment/Exercise - 05/23/19 0001      Knee/Hip Exercises: Aerobic   Nustep  26mn L4 UE/LE      Knee/Hip Exercises: Seated   Long Arc Quad  Strengthening;Both;10 reps;3 sets    LIll[employer] Tool WorksWeight  3 lbs.    Other Seated Knee/Hip Exercises  seated IR w yellow band 2x10    Marching  Strengthening;Both;2 sets;10 reps    Marching Weights  3 lbs.    Hamstring Curl  Strengthening;Right;20 reps;10 reps    Hamstring Limitations  red t band    Sit to Sand  20 reps;without UE support      Knee/Hip Exercises: Supine   Short Arc Quad Sets  Strengthening;Right;20 reps;3 sets    Short Arc Quad Sets Limitations  3# w grey ball for VMO activation    Bridges with Clamshell  Strengthening;Both;20 reps   red t-band   Straight Leg Raise with External Rotation  Strengthening;Right;10 reps;2 sets                  PT Long Term Goals - 05/23/19 0851      PT LONG TERM GOAL #1   Title  Patient will be independent with HEP    Time  6    Period  Weeks    Status  On-going      PT LONG TERM GOAL #2   Title  Patient will demonstrate 4/5 or greater right hip and right knee MMT in all planes to improve stability during functional tasks.    Time  6    Period  Weeks    Status  On-going      PT LONG TERM GOAL #3   Title  Patient will improve functional LE strength as noted by the ability to perform modified 5x sit to stand in 14 seconds or less.    Baseline  Met 05/23/19    Time  6    Period  Weeks    Status  Achieved   05/23/19           Plan - 05/23/19 08185  Clinical Impression Statement  Patient tolerated treatment well today. Patient able to progress with Right LE exercises to focus on all muscles. Today avoided any pressure on hips due to soreness with any pressure. Patient Met LTG today and able to perform sit to stands x5 in 13 seconds. Remaining goals ongoing.    Personal Factors and Comorbidities  Age;Time since onset of injury/illness/exacerbation    Examination-Activity Limitations  Stand;Transfers;Carry    Examination-Participation Restrictions  Driving    Stability/Clinical Decision Making  Stable/Uncomplicated    Rehab Potential  Good    PT Frequency  2x / week    PT Duration  6 weeks    PT Treatment/Interventions  ADLs/Self Care Home Management;Moist Heat;Cryotherapy;Gait training;Stair training;Functional mobility training;Therapeutic activities;Therapeutic exercise;Balance training;Neuromuscular re-education;Manual techniques;Passive range of motion;Patient/family education    PT Next Visit Plan  cont with POC for bilateral LE strengthening, core stability, balance activities in sitting and standing. issue HEP progression next week    Consulted and Agree with Plan of Care   Patient       Patient will benefit from skilled therapeutic intervention in order to improve the following deficits and impairments:  Decreased activity tolerance, Difficulty walking, Abnormal gait, Decreased balance, Decreased strength  Visit Diagnosis: Unsteadiness on feet  Muscle weakness (generalized)     Problem List There are no problems to display for this patient.   Phillips Climes, PTA 05/23/2019, 9:04 AM  [facility] [place], [place], [address] Phone: [phone]   Fax:  [phone]  Name: Chelsea [name] MRN: [id number] Date of Birth: 01/20/1945

## 2019-05-29 ENCOUNTER — Other Ambulatory Visit: Payer: Self-pay

## 2019-05-29 ENCOUNTER — Ambulatory Visit: Payer: PPO | Admitting: *Deleted

## 2019-05-29 DIAGNOSIS — R2681 Unsteadiness on feet: Secondary | ICD-10-CM

## 2019-05-29 DIAGNOSIS — M6281 Muscle weakness (generalized): Secondary | ICD-10-CM

## 2019-05-29 NOTE — Therapy (Signed)
[facility] [place], [place], [address] Phone: [phone]   Fax:  [phone]  Physical Therapy Treatment  Patient Details  Name: Chelsea [name] MRN: [id number] Date of Birth: February 20, 1945 Referring Provider (PT): Tania Ade, MD   Encounter Date: 05/29/2019  PT End of Session - 05/29/19 0823    Visit Number  8    Number of Visits  12    Date for PT Re-Evaluation  06/20/19    PT Start Time  0815    PT Stop Time  0900    PT Time Calculation (min)  45 min       No past medical history on file.  No past surgical history on file.  There were no vitals filed for this visit.  Subjective Assessment - 05/29/19 0822    Subjective  COVID-19 screening performed upon arrival. Patient arrived and reported no pain today.    Limitations  Standing;Walking;House hold activities    Diagnostic tests  x-ray: normal    Currently in Pain?  No/denies                        [facility] Adult PT Treatment/Exercise - 05/29/19 0001      Knee/Hip Exercises: Aerobic   Nustep  30mn L4 UE/LE   seat 9      Knee/Hip Exercises: Standing   Forward Step Up  Right;2 sets;Step Height: 6";10 reps    Rocker Board  5 minutes   PF/DF and balance with CGA     Knee/Hip Exercises: Seated   Long Arc Quad  Strengthening;Both;10 reps;3 sets    LIllinois Tool WorksWeight  3 lbs.    Sit to Sand  20 reps;without UE support      Knee/Hip Exercises: Supine   Bridges with BDiona FoleySqueeze  Strengthening;Both;2 sets;10 reps    Bridges with Clamshell  Strengthening;Both;20 reps   red t-band   Straight Leg Raise with External Rotation  Strengthening;Right;10 reps;2 sets                  PT Long Term Goals - 05/23/19 0851      PT LONG TERM GOAL #1   Title  Patient will be independent with HEP    Time  6    Period  Weeks    Status  On-going      PT LONG TERM GOAL #2   Title  Patient will demonstrate 4/5 or greater right hip and right  knee MMT in all planes to improve stability during functional tasks.    Time  6    Period  Weeks    Status  On-going      PT LONG TERM GOAL #3   Title  Patient will improve functional LE strength as noted by the ability to perform modified 5x sit to stand in 14 seconds or less.    Baseline  Met 05/23/19    Time  6    Period  Weeks    Status  Achieved   05/23/19           Plan - 05/29/19 0823    Clinical Impression Statement  Pt arrived today doing fairly well and reports doing better at home with ADL's. She was able to progress with standing act's with decrease use of UE's for assist and improved balance as well. No complaints of hip pain after session.    Personal Factors and Comorbidities  Age;Time since onset of injury/illness/exacerbation  Examination-Activity Limitations  Stand;Transfers;Carry    Stability/Clinical Decision Making  Stable/Uncomplicated    Rehab Potential  Good    PT Frequency  2x / week    PT Duration  6 weeks    PT Treatment/Interventions  ADLs/Self Care Home Management;Moist Heat;Cryotherapy;Gait training;Stair training;Functional mobility training;Therapeutic activities;Therapeutic exercise;Balance training;Neuromuscular re-education;Manual techniques;Passive range of motion;Patient/family education    PT Next Visit Plan  cont with POC for bilateral LE strengthening, core stability, balance activities in sitting and standing. issue HEP progression next week       Patient will benefit from skilled therapeutic intervention in order to improve the following deficits and impairments:  Decreased activity tolerance, Difficulty walking, Abnormal gait, Decreased balance, Decreased strength  Visit Diagnosis: Muscle weakness (generalized)  Unsteadiness on feet     Problem List There are no problems to display for this patient.   RAMSEUR,CHRIS, PTA 05/29/2019, 9:12 AM  [facility] [place], [place], [address] Phone: [phone]   Fax:  [phone]  Name: Chelsea [name] MRN: [id number] Date of Birth: 1945/03/28

## 2019-05-31 ENCOUNTER — Encounter: Payer: Self-pay | Admitting: Physical Therapy

## 2019-05-31 ENCOUNTER — Other Ambulatory Visit: Payer: Self-pay

## 2019-05-31 ENCOUNTER — Ambulatory Visit: Payer: PPO | Admitting: Physical Therapy

## 2019-05-31 DIAGNOSIS — M6281 Muscle weakness (generalized): Secondary | ICD-10-CM

## 2019-05-31 DIAGNOSIS — R2681 Unsteadiness on feet: Secondary | ICD-10-CM | POA: Diagnosis not present

## 2019-05-31 NOTE — Therapy (Signed)
[facility] [place], [place], [address] Phone: [phone]   Fax:  [phone]  Physical Therapy Treatment  Patient Details  Name: Chelsea [name] MRN: [id number] Date of Birth: [birth year] Referring Provider (PT): Tania Ade, MD   Encounter Date: 05/31/2019  PT End of Session - 05/31/19 0819    Visit Number  9    Number of Visits  12    Date for PT Re-Evaluation  06/20/19    PT Start Time  0815    PT Stop Time  0858    PT Time Calculation (min)  43 min    Activity Tolerance  Patient tolerated treatment well    Behavior During Therapy  [facility] for tasks assessed/performed       History reviewed. No pertinent past medical history.  History reviewed. No pertinent surgical history.  There were no vitals filed for this visit.  Subjective Assessment - 05/31/19 0819    Subjective  COVID-19 screening performed upon arrival. Patient arrived and reported no pain today.    Pertinent History  n/a    Limitations  Standing;Walking;House hold activities    Diagnostic tests  x-ray: normal    Currently in Pain?  No/denies                        [facility] Adult PT Treatment/Exercise - 05/31/19 0001      Knee/Hip Exercises: Aerobic   Nustep  10 minutes L4 UE/LE      Knee/Hip Exercises: Standing   Hip ADduction  AROM;Both;15 reps    Forward Step Up  Right;2 sets;Step Height: 6";10 reps    Rocker Board  3 minutes    Other Standing Knee Exercises  tapping on balance pods      Knee/Hip Exercises: Seated   Long Arc Quad  Strengthening;Both;10 reps;3 sets    Long Arc Quad Weight  3 lbs.    Ball Squeeze  x 20 holding 5 seconds    Other Seated Knee/Hip Exercises  clam shells with red theraband x 20 reps    Sit to Sand  20 reps;without UE support      Knee/Hip Exercises: Supine   Bridges with Diona Foley Squeeze  --    Constance Haw with Clamshell  --    Straight Leg Raise with External Rotation  --                   PT Long Term Goals - 05/31/19 0820      PT LONG TERM GOAL #1   Title  Patient will be independent with HEP    Time  6    Period  Weeks    Status  On-going      PT LONG TERM GOAL #2   Title  Patient will demonstrate 4/5 or greater right hip and right knee MMT in all planes to improve stability during functional tasks.    Period  Weeks      PT LONG TERM GOAL #3   Title  Patient will improve functional LE strength as noted by the ability to perform modified 5x sit to stand in 14 seconds or less.    Baseline  Met 05/23/19    Status  Achieved            Plan - 05/31/19 0834    Clinical Impression Statement  Pt tolerating all exericses well today. No pain before or during therapy. Pt still with guarded gait and increased fear of  falling. Pt requiring UE support and supervision for dynamic balance activities. Continue skilled PT to progress toward pt's LTG's.    Personal Factors and Comorbidities  Age;Time since onset of injury/illness/exacerbation    Examination-Activity Limitations  Stand;Transfers;Carry    Examination-Participation Restrictions  Driving    Stability/Clinical Decision Making  Stable/Uncomplicated    Rehab Potential  Good    PT Frequency  2x / week    PT Duration  6 weeks    PT Treatment/Interventions  ADLs/Self Care Home Management;Moist Heat;Cryotherapy;Gait training;Stair training;Functional mobility training;Therapeutic activities;Therapeutic exercise;Balance training;Neuromuscular re-education;Manual techniques;Passive range of motion;Patient/family education    PT Next Visit Plan  cont with POC for bilateral LE strengthening, core stability, balance activities in sitting and standing. issue HEP progression next week    PT Home Exercise Plan  see patient education section    Consulted and Agree with Plan of Care  Patient       Patient will benefit from skilled therapeutic intervention in order to improve the following deficits and  impairments:  Decreased activity tolerance, Difficulty walking, Abnormal gait, Decreased balance, Decreased strength  Visit Diagnosis: Muscle weakness (generalized)  Unsteadiness on feet     Problem List There are no problems to display for this patient.   Oretha Caprice, PT, MPT 05/31/2019, 8:55 AM  [facility] [address] [place], [place], [address] Phone: [phone]   Fax:  [phone]  Name: Chelsea [name] MRN: [id number] Date of Birth: 05-11-1945

## 2019-06-05 ENCOUNTER — Ambulatory Visit: Payer: PPO | Admitting: Physical Therapy

## 2019-06-05 ENCOUNTER — Other Ambulatory Visit: Payer: Self-pay

## 2019-06-05 ENCOUNTER — Encounter: Payer: Self-pay | Admitting: Physical Therapy

## 2019-06-05 DIAGNOSIS — R2681 Unsteadiness on feet: Secondary | ICD-10-CM

## 2019-06-05 DIAGNOSIS — M6281 Muscle weakness (generalized): Secondary | ICD-10-CM

## 2019-06-05 NOTE — Therapy (Signed)
[facility] [place], [place], [address] Phone: [phone]   Fax:  [phone]  Physical Therapy Treatment  Progress Note Reporting Period 05/02/2019 to 06/05/2019  See note below for Objective Data and Assessment of Progress/Goals. Patient has made progress with functional activities at home and feels more confident with LE strength. Gabriela Eves, PT, DPT     Patient Details  Name: Chelsea [name] MRN: [id number] Date of Birth: 20-Aug-1945 Referring Provider (PT): Tania Ade, MD   Encounter Date: 06/05/2019  PT End of Session - 06/05/19 0916    Visit Number  10    Number of Visits  12    Date for PT Re-Evaluation  06/20/19    PT Start Time  0815    PT Stop Time  0902    PT Time Calculation (min)  47 min    Activity Tolerance  Patient tolerated treatment well    Behavior During Therapy  [facility] for tasks assessed/performed       History reviewed. No pertinent past medical history.  History reviewed. No pertinent surgical history.  There were no vitals filed for this visit.  Subjective Assessment - 06/05/19 0846    Subjective  COVID-19 screening performed upon arrival. Patient reported feeling good with no falls and more confidence with her leg strength    Pertinent History  n/a    Limitations  Standing;Walking;House hold activities    Diagnostic tests  x-ray: normal    Currently in Pain?  No/denies         [facility] PT Assessment - 06/05/19 0001      Assessment   Medical Diagnosis  Right leg weakness    Referring Provider (PT)  Tania Ade, MD    Next MD Visit  none    Prior Therapy  no      Precautions   Precautions  None      Strength   Strength Assessment Site  Hip;Knee    Right/Left Hip  Right    Right Hip Flexion  3/5    Right Hip ABduction  4-/5    Left Hip Flexion  3+/5    Left Hip ABduction  4-/5    Right Knee Flexion  4-/5    Right Knee Extension  4-/5    Left Knee Flexion  4+/5     Left Knee Extension  4+/5                    [facility] Adult PT Treatment/Exercise - 06/05/19 0001      Transfers   Five time sit to stand comments   14.9 seconds modified with UEs on thighs      Knee/Hip Exercises: Aerobic   Nustep  10 minutes L3 UE/LE      Knee/Hip Exercises: Standing   Forward Step Up  Right;2 sets;Step Height: 6";10 reps    Rocker Board  3 minutes    Other Standing Knee Exercises  balance beam lateral stepping and tandem walking x3 mins each      Knee/Hip Exercises: Seated   Long Arc Quad  Strengthening;Both;10 reps;3 sets    Long Arc Quad Weight  3 lbs.    Other Seated Knee/Hip Exercises  clam shells with red theraband x 30 reps    Sit to Sand  2 sets;5 reps;with UE support      Knee/Hip Exercises: Supine   Bridges with Clamshell  Strengthening;Both;20 reps   red theraband  PT Long Term Goals - 06/05/19 6734      PT LONG TERM GOAL #1   Title  Patient will be independent with HEP    Time  6    Period  Weeks    Status  Achieved      PT LONG TERM GOAL #2   Title  Patient will demonstrate 4/5 or greater right hip and right knee MMT in all planes to improve stability during functional tasks.    Time  6    Period  Weeks    Status  Partially Met      PT LONG TERM GOAL #3   Title  Patient will improve functional LE strength as noted by the ability to perform modified 5x sit to stand in 14 seconds or less.    Baseline  Met 05/23/19    Time  6    Period  Weeks    Status  Achieved            Plan - 06/05/19 1937    Clinical Impression Statement  Patient responded well to therapy session with no reports of pain. Patient demonstrated good form and technique for all TEs. Strength goal was partially met but has noted significant improvements at home. Patient to be discharged next visit. Please provide with advanced HEP.    Personal Factors and Comorbidities  Age;Time since onset of injury/illness/exacerbation     Examination-Activity Limitations  Stand;Transfers;Carry    Examination-Participation Restrictions  Driving    Stability/Clinical Decision Making  Stable/Uncomplicated    Clinical Decision Making  Low    Rehab Potential  Good    PT Frequency  2x / week    PT Duration  6 weeks    PT Treatment/Interventions  ADLs/Self Care Home Management;Moist Heat;Cryotherapy;Gait training;Stair training;Functional mobility training;Therapeutic activities;Therapeutic exercise;Balance training;Neuromuscular re-education;Manual techniques;Passive range of motion;Patient/family education    PT Next Visit Plan  Advanced HEP for LE strength, DC.    PT Home Exercise Plan  see patient education section    Consulted and Agree with Plan of Care  Patient       Patient will benefit from skilled therapeutic intervention in order to improve the following deficits and impairments:  Decreased activity tolerance, Difficulty walking, Abnormal gait, Decreased balance, Decreased strength  Visit Diagnosis: Muscle weakness (generalized)  Unsteadiness on feet     Problem List There are no problems to display for this patient.   Gabriela Eves, PT, DPT 06/05/2019, 9:59 AM  [facility] [address] [place], [place], [address] Phone: [phone]   Fax:  [phone]  Name: KHARI LETT MRN: [id number] Date of Birth: [birth year]

## 2019-06-07 ENCOUNTER — Ambulatory Visit: Payer: PPO | Admitting: Physical Therapy

## 2019-06-07 ENCOUNTER — Encounter: Payer: Self-pay | Admitting: Physical Therapy

## 2019-06-07 ENCOUNTER — Other Ambulatory Visit: Payer: Self-pay

## 2019-06-07 DIAGNOSIS — R2681 Unsteadiness on feet: Secondary | ICD-10-CM | POA: Diagnosis not present

## 2019-06-07 DIAGNOSIS — M6281 Muscle weakness (generalized): Secondary | ICD-10-CM

## 2019-06-07 NOTE — Patient Instructions (Addendum)
Knee Extension: Resisted (Sitting)    With band looped around right ankle and under other foot, straighten leg with ankle loop. Keep other leg bent to increase resistance. Repeat __10-20__ times per set. Do __2__ sets per session. Do _1-2___ sessions per day.     Knee flexion with band  While sitting place the band around your ankle. Extend your knee until your leg is straight, then bend it to 90 degrees. Make sure the band always has the same resistance.     Seated Hip ABDUCTION with TheraBand   SEATED BILATERAL HIP ABDUCTION WITH THERABAND  Starting in a sitting position with good posture with back upright, away from back of chair. Bring knees and feet together and wrap THERABAND around both knees.   Keeping feet together, slowly move knees out to either side to separate your legs. SLOWLY return knees back to starting position, with knees together.   Complete 10-12 repetitions (increasing to 15 and then 20 reps if exercise is too easy). If exercise is still too easy, use DARKER colored theraband (least resistance-Red>Green>Blue>Black-most resistance).   SEATED MARCHING - ELASTIC BAND  Start by sitting in a chair with an elastic band wrapped around your lower thighs.  Next, move a knee upward, set it back down and then alternate to the other side.   SEATED MARCHING  While seated, tie red band around foot and hold with band,  lift your foot off the ground as your bend your knee.  Lower back down and repeat on the opposite leg. Repeat this alternating movement.       Lower Body: Toe Rise   Standing, place feet apart. Hold arms out for balance or use support. Rise up on toes. Hold _3___ seconds, then lower. Repeat immediately. Repeat __10__ times. Do __2__ sessions per day.     Stand with feet shoulder width apart. Push buttocks backward and lower slowly, sitting in chair lightly and returning to standing position. Complete _2_ sets of 10_ repetitions. Perform __2-3_  sessions per day.

## 2019-06-07 NOTE — Therapy (Signed)
[facility] [place], [place], [address] Phone: [phone]   Fax:  [phone]  Physical Therapy Treatment  PHYSICAL THERAPY DISCHARGE SUMMARY  Visits from Start of Care: 11  Current functional level related to goals / functional outcomes: See below   Remaining deficits: See goals   Education / Equipment: HEP Plan: Patient agrees to discharge.  Patient goals were partially met. Patient is being discharged due to being pleased with the current functional level.  ?????    Gabriela Eves, PT, DPT  Patient Details  Name: Chelsea [name] MRN: [id number] Date of Birth: 1945-11-12 Referring Provider (PT): Tania Ade, MD   Encounter Date: 06/07/2019  PT End of Session - 06/07/19 0829    Visit Number  11    Number of Visits  12    Date for PT Re-Evaluation  06/20/19    PT Start Time  0815    PT Stop Time  0858    PT Time Calculation (min)  43 min    Activity Tolerance  Patient tolerated treatment well    Behavior During Therapy  [facility] for tasks assessed/performed       History reviewed. No pertinent past medical history.  History reviewed. No pertinent surgical history.  There were no vitals filed for this visit.  Subjective Assessment - 06/07/19 0817    Subjective  COVID-19 screening performed upon arrival. Patient reported no new complaints    Pertinent History  n/a    Limitations  Standing;Walking;House hold activities    Diagnostic tests  x-ray: normal    Currently in Pain?  No/denies                        [facility] Adult PT Treatment/Exercise - 06/07/19 0001      Knee/Hip Exercises: Aerobic   Nustep  x12 min L3 UE/LE      Knee/Hip Exercises: Standing   Heel Raises  Both;20 reps   for HEP progression   Forward Step Up  Right;2 sets;Step Height: 6";10 reps    Rocker Board  3 minutes      Knee/Hip Exercises: Seated   Other Seated Knee/Hip Exercises  seated clam x30, hip flexion  x30, hip ext x30, knee flexion x30, knee ext x30 all with red tband and HEP provided for all exercises    Sit to Sand  20 reps;without UE support   for HEP progression     Knee/Hip Exercises: Supine   Bridges with Clamshell  20 reps   red band   Straight Leg Raise with External Rotation  Strengthening;Right;20 reps             PT Education - 06/07/19 0852    Education Details  HEP progression    Person(s) Educated  Patient    Methods  Explanation;Demonstration;Handout    Comprehension  Verbalized understanding;Returned demonstration          PT Long Term Goals - 06/05/19 0921      PT LONG TERM GOAL #1   Title  Patient will be independent with HEP    Time  6    Period  Weeks    Status  Achieved      PT LONG TERM GOAL #2   Title  Patient will demonstrate 4/5 or greater right hip and right knee MMT in all planes to improve stability during functional tasks.    Time  6    Period  Weeks    Status  Partially Met      PT LONG TERM GOAL #3   Title  Patient will improve functional LE strength as noted by the ability to perform modified 5x sit to stand in 14 seconds or less.    Baseline  Met 05/23/19    Time  6    Period  Weeks    Status  Achieved            Plan - 06/07/19 0857    Clinical Impression Statement  Patient tolerated treatment well today. Patient has reported overall improvement, no falls reported and able to perform all ADL's with greater ease. Patient given HEP for progression with all bands to progress when needed. Patient progressed with all goals.    Personal Factors and Comorbidities  Age;Time since onset of injury/illness/exacerbation    Examination-Activity Limitations  Stand;Transfers;Carry    Examination-Participation Restrictions  Driving    Stability/Clinical Decision Making  Stable/Uncomplicated    Rehab Potential  Good    PT Frequency  2x / week    PT Duration  6 weeks    PT Treatment/Interventions  ADLs/Self Care Home Management;Moist  Heat;Cryotherapy;Gait training;Stair training;Functional mobility training;Therapeutic activities;Therapeutic exercise;Balance training;Neuromuscular re-education;Manual techniques;Passive range of motion;Patient/family education    PT Next Visit Plan  DC    Consulted and Agree with Plan of Care  Patient       Patient will benefit from skilled therapeutic intervention in order to improve the following deficits and impairments:  Decreased activity tolerance, Difficulty walking, Abnormal gait, Decreased balance, Decreased strength  Visit Diagnosis: Muscle weakness (generalized)  Unsteadiness on feet     Problem List There are no problems to display for this patient.   Ladean Raya, PTA 06/07/19 9:03 AM  [facility] [address] [place], [place], [address] Phone: [phone]   Fax:  [phone]  Name: Chelsea [name] MRN: [id number] Date of Birth: 1945/09/28

## 2019-10-30 ENCOUNTER — Emergency Department (HOSPITAL_COMMUNITY)
Admission: EM | Admit: 2019-10-30 | Discharge: 2019-10-30 | Disposition: A | Discharge status: Home / Self Care | Payer: PPO | Attending: Emergency Medicine | Admitting: Emergency Medicine

## 2019-10-30 ENCOUNTER — Encounter (HOSPITAL_COMMUNITY): Payer: Self-pay | Admitting: Emergency Medicine

## 2019-10-30 ENCOUNTER — Other Ambulatory Visit: Payer: Self-pay

## 2019-10-30 ENCOUNTER — Emergency Department (HOSPITAL_COMMUNITY): Payer: PPO

## 2019-10-30 DIAGNOSIS — R739 Hyperglycemia, unspecified: Secondary | ICD-10-CM | POA: Diagnosis not present

## 2019-10-30 DIAGNOSIS — I10 Essential (primary) hypertension: Secondary | ICD-10-CM | POA: Diagnosis not present

## 2019-10-30 DIAGNOSIS — I6381 Other cerebral infarction due to occlusion or stenosis of small artery: Secondary | ICD-10-CM | POA: Diagnosis not present

## 2019-10-30 DIAGNOSIS — R930 Abnormal findings on diagnostic imaging of skull and head, not elsewhere classified: Secondary | ICD-10-CM | POA: Insufficient documentation

## 2019-10-30 DIAGNOSIS — G319 Degenerative disease of nervous system, unspecified: Secondary | ICD-10-CM | POA: Diagnosis not present

## 2019-10-30 DIAGNOSIS — I6529 Occlusion and stenosis of unspecified carotid artery: Secondary | ICD-10-CM | POA: Diagnosis not present

## 2019-10-30 DIAGNOSIS — W108XXA Fall (on) (from) other stairs and steps, initial encounter: Secondary | ICD-10-CM | POA: Insufficient documentation

## 2019-10-30 DIAGNOSIS — S0990XA Unspecified injury of head, initial encounter: Secondary | ICD-10-CM | POA: Diagnosis not present

## 2019-10-30 LAB — CBC
HCT: 41.1 % (ref 36.0–46.0)
Hemoglobin: 13.4 g/dL (ref 12.0–15.0)
MCH: 30.7 pg (ref 26.0–34.0)
MCHC: 32.6 g/dL (ref 30.0–36.0)
MCV: 94.3 fL (ref 80.0–100.0)
Platelets: 313 10*3/uL (ref 150–400)
RBC: 4.36 MIL/uL (ref 3.87–5.11)
RDW: 12.2 % (ref 11.5–15.5)
WBC: 13.3 10*3/uL — ABNORMAL HIGH (ref 4.0–10.5)
nRBC: 0 % (ref 0.0–0.2)

## 2019-10-30 LAB — BASIC METABOLIC PANEL
Anion gap: 9 (ref 5–15)
BUN: 17 mg/dL (ref 8–23)
CO2: 28 mmol/L (ref 22–32)
Calcium: 9.3 mg/dL (ref 8.9–10.3)
Chloride: 98 mmol/L (ref 98–111)
Creatinine, Ser: 0.95 mg/dL (ref 0.44–1.00)
GFR, Estimated: 59 mL/min — ABNORMAL LOW (ref >60)
Glucose, Bld: 378 mg/dL — ABNORMAL HIGH (ref 70–99)
Potassium: 4.2 mmol/L (ref 3.5–5.1)
Sodium: 135 mmol/L (ref 135–145)

## 2019-10-30 LAB — URINALYSIS, ROUTINE W REFLEX MICROSCOPIC
Bacteria, UA: NONE SEEN
Bilirubin Urine: NEGATIVE
Glucose, UA: >=500 mg/dL — AB
Ketones, ur: NEGATIVE mg/dL
Nitrite: NEGATIVE
Protein, ur: 100 mg/dL — AB
Specific Gravity, Urine: 1.01 (ref 1.005–1.030)
pH: 5 (ref 5.0–8.0)

## 2019-10-30 LAB — CBG MONITORING, ED: Glucose-Capillary: 190 mg/dL — ABNORMAL HIGH (ref 70–99)

## 2019-10-30 MED ORDER — METFORMIN HCL 500 MG PO TABS: 500.0000 mg | ORAL_TABLET | Freq: Two times a day (BID) | ORAL | 1 refills | Status: DC

## 2019-10-30 MED ORDER — HYDROCHLOROTHIAZIDE 25 MG PO TABS
25.0000 mg | ORAL_TABLET | Freq: Once | ORAL | Status: AC
  Administered 2019-10-30: 25 mg via ORAL
  Filled 2019-10-30: qty 1

## 2019-10-30 MED ORDER — HYDROCHLOROTHIAZIDE 25 MG PO TABS: 25.0000 mg | ORAL_TABLET | Freq: Every day | ORAL | 1 refills | Status: DC

## 2019-10-30 NOTE — ED Provider Notes (Signed)
[facility]EMERGENCY DEPT [facility] Emergency Department Provider Note MRN:  [id number]  Arrival date & time: 10/30/19     Chief Complaint   Fall and Hypertension   History of Present Illness   Chelsea [name] is a 74 y.o. year-old female with no pertinent past medical history presenting to the ED with chief complaint of fall and hypertension.  Patient has not seen a doctor in 20 years.  She has chronic pain to her right leg and sometimes it "gives out".  No changes recently.  Over the years her daughter has checked her blood pressure and it has always been high.  Had a trip and fall today down 2 steps due to the leg issues.  Leg is not weak, leg is not numb, no numbness or weakness to the other extremities.  No headache or vision change, denies any head trauma or loss of consciousness, no chest pain or shortness of breath, no abdominal pain.  Daughter does note that she was confused for about 20 minutes after the fall.  Review of Systems  A complete 10 system review of systems was obtained and all systems are negative except as noted in the HPI and PMH.   Patient's Health History   History reviewed. No pertinent past medical history.  History reviewed. No pertinent surgical history.  History reviewed. No pertinent family history.  Social History   Socioeconomic History  . Marital status: Married    Spouse name: Not on file  . Number of children: Not on file  . Years of education: Not on file  . Highest education level: Not on file  Occupational History  . Not on file  Tobacco Use  . Smoking status: Not on file  Substance and Sexual Activity  . Alcohol use: Not on file  . Drug use: Not on file  . Sexual activity: Not on file  Other Topics Concern  . Not on file  Social History Narrative  . Not on file   Social Determinants of Health   Financial Resource Strain:   . Difficulty of Paying Living Expenses: Not on file  Food Insecurity:   . Worried About Programme researcher, broadcasting/film/video  in the Last Year: Not on file  . Ran Out of Food in the Last Year: Not on file  Transportation Needs:   . Lack of Transportation (Medical): Not on file  . Lack of Transportation (Non-Medical): Not on file  Physical Activity:   . Days of Exercise per Week: Not on file  . Minutes of Exercise per Session: Not on file  Stress:   . Feeling of Stress : Not on file  Social Connections:   . Frequency of Communication with Friends and Family: Not on file  . Frequency of Social Gatherings with Friends and Family: Not on file  . Attends Religious Services: Not on file  . Active Member of Clubs or Organizations: Not on file  . Attends Banker Meetings: Not on file  . Marital Status: Not on file  Intimate Partner Violence:   . Fear of Current or Ex-Partner: Not on file  . Emotionally Abused: Not on file  . Physically Abused: Not on file  . Sexually Abused: Not on file     Physical Exam   Vitals:   10/30/19 2130 10/30/19 2200  BP: (!) 218/95 (!) 207/86  Pulse: 69 72  Resp: 15 14  Temp:    SpO2:      CONSTITUTIONAL: Well-appearing, NAD NEURO:  Alert and  oriented x 3, no focal deficits EYES:  eyes equal and reactive ENT/NECK:  no LAD, no JVD CARDIO: Rate, well-perfused, normal S1 and S2 PULM:  CTAB no wheezing or rhonchi GI/GU:  normal bowel sounds, non-distended, non-tender MSK/SPINE:  No gross deformities, no edema SKIN:  no rash, atraumatic PSYCH:  Appropriate speech and behavior  *Additional and/or pertinent findings included in MDM below  Diagnostic and Interventional Summary    EKG Interpretation  Date/Time:  Tuesday October 30 2019 20:50:17 EDT Ventricular Rate:  67 PR Interval:    QRS Duration: 100 QT Interval:  425 QTC Calculation: 449 R Axis:   -54 Text Interpretation: Sinus rhythm Probable left atrial enlargement Left anterior fascicular block Abnormal R-wave progression, late transition LVH with secondary repolarization abnormality Confirmed by  Kennis Carina [phone]) on 10/30/2019 9:33:04 PM      Labs Reviewed  BASIC METABOLIC PANEL - Abnormal; Notable for the following components:      Result Value   Glucose, Bld 378 (*)    GFR, Estimated 59 (*)    All other components within normal limits  CBC - Abnormal; Notable for the following components:   WBC 13.3 (*)    All other components within normal limits  URINALYSIS, ROUTINE W REFLEX MICROSCOPIC - Abnormal; Notable for the following components:   Color, Urine STRAW (*)    Glucose, UA >=500 (*)    Hgb urine dipstick SMALL (*)    Protein, ur 100 (*)    Leukocytes,Ua TRACE (*)    All other components within normal limits  CBG MONITORING, ED - Abnormal; Notable for the following components:   Glucose-Capillary 190 (*)    All other components within normal limits  HEMOGLOBIN A1C    CT HEAD WO CONTRAST  Final Result      Medications  hydrochlorothiazide (HYDRODIURIL) tablet 25 mg (25 mg Oral Given 10/30/19 2145)     Procedures  /  Critical Care Procedures  ED Course and Medical Decision Making  I have reviewed the triage vital signs, the nursing notes, and pertinent available records from the EMR.  Listed above are laboratory and imaging tests that I personally ordered, reviewed, and interpreted and then considered in my medical decision making (see below for details).  Confusion after ground-level fall today, fall seems mechanical, there was some question of weakness in the nursing documentation however on my exam there is completely normal and symmetric strength, sensation, and a stroke scale of 0.  Patient is significantly hypertensive but this appears to be chronic, labs are reassuring with no signs of endorgan damage.  Given the brief episode of confusion after the fall will obtain screening CT head, otherwise patient is appropriate for discharge with PCP follow-up.     CT is without acute process, some evidence of remote infarcts in the past.  Labs reveal blood  sugar of 378.  Overall I suspect patient has underlying primary hypertension, type 2 diabetes.  She also is a daily cigarette smoker.  She has numerous cardiovascular risk factors and would highly benefit from primary care follow-up.  I explained all of this to the patient with daughter present and they agreed to follow-up with the primary care doctor soon as possible.  Until then will start on HCTZ, Metformin and advise cutting down on cigarette smoking.  Elmer Sow. Pilar Plate, MD [facility] Emergency Medicine [facility] [email or web address]  Final Clinical Impressions(s) / ED Diagnoses     ICD-10-CM   1. Hyperglycemia  R73.9  2. Hypertension, unspecified type  I10     ED Discharge Orders         Ordered    hydrochlorothiazide (HYDRODIURIL) 25 MG tablet  Daily        10/30/19 2247    metFORMIN (GLUCOPHAGE) 500 MG tablet  2 times daily with meals        10/30/19 2247           Discharge Instructions Discussed with and Provided to Patient:     Discharge Instructions     You were evaluated in the Emergency Department and after careful evaluation, we did not find any emergent condition requiring admission or further testing in the hospital.  Your exam/testing today is overall reassuring.  Today we have discovered that you have high blood pressure and you also likely have diabetes.  We found on your CT scan that you likely have had some small strokes in the past but no new strokes today.  It is very important that you follow-up with your primary care doctor to work on these risk factors.  Please take the medications provided as directed.  Please return to the Emergency Department if you experience any worsening of your condition.   Thank you for allowing [place] to be a part of your care.       Sabas Sous, MD 10/30/19 2249

## 2019-10-30 NOTE — ED Triage Notes (Signed)
Patient arrives to ED with complaints of ongoing right leg pain/weakness for the past couple months. Per pt she's been in PTrecently but today she fell down two steps due to the leg weakness. Denies head or neck injury because patient caught herself. Per pt she has not been to a doctor in 15 years and knows she has HTN and diabetes. Pt states that her BP usually runs mid 200s.

## 2019-10-30 NOTE — Discharge Instructions (Addendum)
You were evaluated in the Emergency Department and after careful evaluation, we did not find any emergent condition requiring admission or further testing in the hospital.  Your exam/testing today is overall reassuring.  Today we have discovered that you have high blood pressure and you also likely have diabetes.  We found on your CT scan that you likely have had some small strokes in the past but no new strokes today.  It is very important that you follow-up with your primary care doctor to work on these risk factors.  Please take the medications provided as directed.  Please return to the Emergency Department if you experience any worsening of your condition.   Thank you for allowing [place] to be a part of your care.

## 2019-11-01 DIAGNOSIS — Z7689 Persons encountering health services in other specified circumstances: Secondary | ICD-10-CM | POA: Diagnosis not present

## 2019-11-01 DIAGNOSIS — R0989 Other specified symptoms and signs involving the circulatory and respiratory systems: Secondary | ICD-10-CM | POA: Diagnosis not present

## 2019-11-01 DIAGNOSIS — I639 Cerebral infarction, unspecified: Secondary | ICD-10-CM | POA: Diagnosis not present

## 2019-11-01 DIAGNOSIS — B0229 Other postherpetic nervous system involvement: Secondary | ICD-10-CM | POA: Diagnosis not present

## 2019-11-01 DIAGNOSIS — G629 Polyneuropathy, unspecified: Secondary | ICD-10-CM | POA: Diagnosis not present

## 2019-11-01 DIAGNOSIS — H538 Other visual disturbances: Secondary | ICD-10-CM | POA: Diagnosis not present

## 2019-11-01 DIAGNOSIS — R42 Dizziness and giddiness: Secondary | ICD-10-CM | POA: Diagnosis not present

## 2019-11-01 DIAGNOSIS — E119 Type 2 diabetes mellitus without complications: Secondary | ICD-10-CM | POA: Diagnosis not present

## 2019-11-01 DIAGNOSIS — I10 Essential (primary) hypertension: Secondary | ICD-10-CM | POA: Diagnosis not present

## 2019-11-02 ENCOUNTER — Other Ambulatory Visit: Payer: Self-pay

## 2019-11-02 ENCOUNTER — Ambulatory Visit (HOSPITAL_COMMUNITY)
Admission: RE | Admit: 2019-11-02 | Discharge: 2019-11-02 | Disposition: A | Discharge status: Home / Self Care | Payer: PPO | Source: Ambulatory Visit | Attending: Vascular Surgery | Admitting: Vascular Surgery

## 2019-11-02 DIAGNOSIS — I6529 Occlusion and stenosis of unspecified carotid artery: Secondary | ICD-10-CM | POA: Diagnosis not present

## 2019-11-05 ENCOUNTER — Other Ambulatory Visit: Payer: Self-pay

## 2019-11-05 ENCOUNTER — Encounter: Payer: Self-pay | Admitting: Vascular Surgery

## 2019-11-05 ENCOUNTER — Ambulatory Visit: Payer: PPO | Admitting: Vascular Surgery

## 2019-11-05 VITALS — BP 189/85 | HR 68 | Temp 97.6°F | Resp 16 | Ht 67.0 in | Wt 141.0 lb

## 2019-11-05 DIAGNOSIS — I6529 Occlusion and stenosis of unspecified carotid artery: Secondary | ICD-10-CM | POA: Diagnosis not present

## 2019-11-05 NOTE — Progress Notes (Signed)
[facility] [place]  Patient name: Chelsea [name] MRN: [id number] DOB: 1945/09/02 Sex: female  REASON FOR CONSULT: Evaluation for left carotid stenosis  HPI: Chelsea [name] is a 74 y.o. female, here today with her daughters for evaluation of left carotid stenosis.  She had not seen physicians for approximately 15 years.  She presented to the emergency room with hypertension and hyperglycemia.  History of diabetes on Metformin and glipizide.  She did not take medication.  She presented to the emergency room on 10/30/2019 after high blood pressure and feeling as though her legs were giving out and feeling dizzy.  She denied any focal neurologic issues.  CT of her head at that time showed atrophy and mild chronic small vessel disease.  Small chronic infarcts in the left parietal lobe and right caudate lobe.  She currently denies any focal deficits.  She did have some visual changes in the same ophthalmology for this as well.  She still is having hypertension with blood pressure in the 200-100 range and her medications are being adjusted.  History reviewed. No pertinent past medical history.  History reviewed. No pertinent family history.  SOCIAL HISTORY: Social History   Socioeconomic History  . Marital status: Married    Spouse name: Not on file  . Number of children: Not on file  . Years of education: Not on file  . Highest education level: Not on file  Occupational History  . Not on file  Tobacco Use  . Smoking status: Current Every Day Smoker  . Smokeless tobacco: Never Used  Substance and Sexual Activity  . Alcohol use: Not Currently  . Drug use: Never  . Sexual activity: Not on file  Other Topics Concern  . Not on file  Social History Narrative  . Not on file   Social Determinants of Health   Financial Resource Strain:   . Difficulty of Paying Living Expenses: Not on file  Food Insecurity:   . Worried About Programme researcher, broadcasting/film/video in the Last Year:  Not on file  . Ran Out of Food in the Last Year: Not on file  Transportation Needs:   . Lack of Transportation (Medical): Not on file  . Lack of Transportation (Non-Medical): Not on file  Physical Activity:   . Days of Exercise per Week: Not on file  . Minutes of Exercise per Session: Not on file  Stress:   . Feeling of Stress : Not on file  Social Connections:   . Frequency of Communication with Friends and Family: Not on file  . Frequency of Social Gatherings with Friends and Family: Not on file  . Attends Religious Services: Not on file  . Active Member of Clubs or Organizations: Not on file  . Attends Banker Meetings: Not on file  . Marital Status: Not on file  Intimate Partner Violence:   . Fear of Current or Ex-Partner: Not on file  . Emotionally Abused: Not on file  . Physically Abused: Not on file  . Sexually Abused: Not on file    Allergies  Allergen Reactions  . Codeine Anaphylaxis and Swelling    Throat swells    . Penicillins Anaphylaxis and Swelling    Throat closes    . Sulfa Antibiotics Anaphylaxis, Swelling and Other (See Comments)    Lips and eye swell   . Benzodiazepines Hives  . Lidocaine Hives    Current Outpatient Medications  Medication Sig Dispense Refill  .  CALCIUM PO Take by mouth.    . gabapentin (NEURONTIN) 100 MG capsule Take by mouth.    . hydrochlorothiazide (HYDRODIURIL) 25 MG tablet Take 1 tablet (25 mg total) by mouth daily. 30 tablet 1  . MAGNESIUM PO Take by mouth.    . Multiple Vitamin (MULTI-VITAMIN DAILY PO) Take by mouth.    . Multiple Vitamins-Minerals (ZINC PO) Take by mouth.    . irbesartan (AVAPRO) 75 MG tablet Take 75 mg by mouth daily. (Patient not taking: Reported on 11/05/2019)    . metFORMIN (GLUCOPHAGE) 500 MG tablet Take 1 tablet (500 mg total) by mouth 2 (two) times daily with a meal. (Patient not taking: Reported on 11/05/2019) 60 tablet 1   No current facility-administered medications for this visit.      REVIEW OF SYSTEMS:  [X]  denotes positive finding, [ ]  denotes negative finding Cardiac  Comments:  Chest pain or chest pressure:    Shortness of breath upon exertion:    Short of breath when lying flat:    Irregular heart rhythm:        Vascular    Pain in calf, thigh, or hip brought on by ambulation:    Pain in feet at night that wakes you up from your sleep:     Blood clot in your veins:    Leg swelling:         Pulmonary    Oxygen at home:    Productive cough:     Wheezing:         Neurologic    Sudden weakness in arms or legs:  x   Sudden numbness in arms or legs:     Sudden onset of difficulty speaking or slurred speech:    Temporary loss of vision in one eye:  x   Problems with dizziness:         Gastrointestinal    Blood in stool:     Vomited blood:         Genitourinary    Burning when urinating:     Blood in urine:        Psychiatric    Major depression:         Hematologic    Bleeding problems:    Problems with blood clotting too easily:        Skin    Rashes or ulcers:        Constitutional    Fever or chills:      PHYSICAL EXAM: Vitals:   11/05/19 1521  BP: (!) 189/85  Pulse: 68  Resp: 16  Temp: 97.6 F (36.4 C)  TempSrc: Other (Comment)  SpO2: 98%  Weight: 141 lb (64 kg)  Height: 5\' 7"  (1.702 m)    GENERAL: The patient is a well-nourished female, in no acute distress. The vital signs are documented above. CARDIAC: There is a regular rate and rhythm.  VASCULAR: Palpable radial pulses bilaterally.  She does have a soft left carotid bruit and no bruit on the right. PULMONARY: There is good air exchange bilaterally without wheezing or rales. ABDOMEN: Soft and non-tender with normal pitched bowel sounds.  MUSCULOSKELETAL: There are no major deformities or cyanosis. NEUROLOGIC: No focal weakness or paresthesias are detected. SKIN: There are no ulcers or rashes noted. PSYCHIATRIC: The patient has a normal affect.  DATA:   Carotid  duplex in our [place] office from 11/02/2019 was reviewed.  This shows critical stenosis in the left internal carotid greater than 80%.  Her end-diastolic's are  markedly elevated at 193 cm/s.  She has normal internal carotid distal to the bifurcation Tatian plaque.  She has minimal narrowing on her right carotid  MEDICAL ISSUES:  Had long discussion with the patient and her daughters present.  Is unclear as to whether she has had any symptoms.  She does have old sign of stroke on her CT scan and some vision changes on her left eye.  I have recommended left carotid endarterectomy for reduction of stroke risk.  I explained that even if this is asymptomatic we would still recommend surgery.  I discussed alternates including stenting but would not recommend this in this patient.  I explained the procedure for surgery including the expected recovery.  Also discussed the 1-1 and half percent risk of stroke with surgery and also very slight risk of cranial nerve injury.  I feel is appropriate for her to wait approximately 1 month to get her blood pressure under better control than the 200/100 she is currently running.  We have tentatively scheduled her surgery for the last week in November.  She will notify should she develop any focal deficits.  She will begin one 81 mg aspirin per day   Gretta Began [facility] [phone]

## 2019-11-06 ENCOUNTER — Emergency Department (HOSPITAL_COMMUNITY)
Admission: EM | Admit: 2019-11-06 | Discharge: 2019-11-06 | Disposition: A | Discharge status: Home / Self Care | Payer: PPO | Source: Home / Self Care | Attending: Emergency Medicine | Admitting: Emergency Medicine

## 2019-11-06 ENCOUNTER — Encounter (HOSPITAL_COMMUNITY): Payer: Self-pay | Admitting: Emergency Medicine

## 2019-11-06 DIAGNOSIS — F17200 Nicotine dependence, unspecified, uncomplicated: Secondary | ICD-10-CM | POA: Insufficient documentation

## 2019-11-06 DIAGNOSIS — I10 Essential (primary) hypertension: Secondary | ICD-10-CM | POA: Insufficient documentation

## 2019-11-06 DIAGNOSIS — R112 Nausea with vomiting, unspecified: Secondary | ICD-10-CM | POA: Diagnosis not present

## 2019-11-06 DIAGNOSIS — E1165 Type 2 diabetes mellitus with hyperglycemia: Secondary | ICD-10-CM | POA: Insufficient documentation

## 2019-11-06 DIAGNOSIS — Z7984 Long term (current) use of oral hypoglycemic drugs: Secondary | ICD-10-CM | POA: Insufficient documentation

## 2019-11-06 DIAGNOSIS — R609 Edema, unspecified: Secondary | ICD-10-CM | POA: Diagnosis not present

## 2019-11-06 DIAGNOSIS — Z79899 Other long term (current) drug therapy: Secondary | ICD-10-CM | POA: Insufficient documentation

## 2019-11-06 DIAGNOSIS — R1111 Vomiting without nausea: Secondary | ICD-10-CM | POA: Diagnosis not present

## 2019-11-06 DIAGNOSIS — R52 Pain, unspecified: Secondary | ICD-10-CM | POA: Diagnosis not present

## 2019-11-06 DIAGNOSIS — R739 Hyperglycemia, unspecified: Secondary | ICD-10-CM

## 2019-11-06 HISTORY — DX: Type 2 diabetes mellitus without complications: E11.9

## 2019-11-06 HISTORY — DX: Essential (primary) hypertension: I10

## 2019-11-06 HISTORY — DX: Blindness, one eye, unspecified eye: H54.40

## 2019-11-06 HISTORY — DX: Cerebral infarction, unspecified: I63.9

## 2019-11-06 HISTORY — DX: Cardiac arrest, cause unspecified: I46.9

## 2019-11-06 LAB — URINALYSIS, ROUTINE W REFLEX MICROSCOPIC
Bilirubin Urine: NEGATIVE
Glucose, UA: >=500 mg/dL — AB
Ketones, ur: NEGATIVE mg/dL
Nitrite: NEGATIVE
Protein, ur: 100 mg/dL — AB
Specific Gravity, Urine: 1.012 (ref 1.005–1.030)
WBC, UA: >50 WBC/hpf — ABNORMAL HIGH (ref 0–5)
pH: 7 (ref 5.0–8.0)

## 2019-11-06 LAB — COMPREHENSIVE METABOLIC PANEL
ALT: 13 U/L (ref 0–44)
AST: 14 U/L — ABNORMAL LOW (ref 15–41)
Albumin: 3.4 g/dL — ABNORMAL LOW (ref 3.5–5.0)
Alkaline Phosphatase: 81 U/L (ref 38–126)
Anion gap: 10 (ref 5–15)
BUN: 26 mg/dL — ABNORMAL HIGH (ref 8–23)
CO2: 27 mmol/L (ref 22–32)
Calcium: 8.7 mg/dL — ABNORMAL LOW (ref 8.9–10.3)
Chloride: 98 mmol/L (ref 98–111)
Creatinine, Ser: 0.86 mg/dL (ref 0.44–1.00)
GFR, Estimated: >60 mL/min (ref >60)
Glucose, Bld: 345 mg/dL — ABNORMAL HIGH (ref 70–99)
Potassium: 4.1 mmol/L (ref 3.5–5.1)
Sodium: 135 mmol/L (ref 135–145)
Total Bilirubin: 0.7 mg/dL (ref 0.3–1.2)
Total Protein: 6 g/dL — ABNORMAL LOW (ref 6.5–8.1)

## 2019-11-06 LAB — CBC WITH DIFFERENTIAL/PLATELET
Abs Immature Granulocytes: 0.03 10*3/uL (ref 0.00–0.07)
Basophils Absolute: 0.1 10*3/uL (ref 0.0–0.1)
Basophils Relative: 1 %
Eosinophils Absolute: 0.1 10*3/uL (ref 0.0–0.5)
Eosinophils Relative: 1 %
HCT: 36.7 % (ref 36.0–46.0)
Hemoglobin: 12.4 g/dL (ref 12.0–15.0)
Immature Granulocytes: 0 %
Lymphocytes Relative: 18 %
Lymphs Abs: 1.6 10*3/uL (ref 0.7–4.0)
MCH: 31.3 pg (ref 26.0–34.0)
MCHC: 33.8 g/dL (ref 30.0–36.0)
MCV: 92.7 fL (ref 80.0–100.0)
Monocytes Absolute: 0.4 10*3/uL (ref 0.1–1.0)
Monocytes Relative: 5 %
Neutro Abs: 6.6 10*3/uL (ref 1.7–7.7)
Neutrophils Relative %: 75 %
Platelets: 295 10*3/uL (ref 150–400)
RBC: 3.96 MIL/uL (ref 3.87–5.11)
RDW: 12.2 % (ref 11.5–15.5)
WBC: 8.9 10*3/uL (ref 4.0–10.5)
nRBC: 0 % (ref 0.0–0.2)

## 2019-11-06 LAB — LIPASE, BLOOD: Lipase: 30 U/L (ref 11–51)

## 2019-11-06 MED ORDER — SODIUM CHLORIDE 0.9 % IV BOLUS
500.0000 mL | Freq: Once | INTRAVENOUS | Status: AC
  Administered 2019-11-06: 500 mL via INTRAVENOUS

## 2019-11-06 NOTE — ED Provider Notes (Signed)
[facility] EMERGENCY DEPARTMENT Provider Note   CSN: [id number] Arrival date & time: 11/06/19  0134     History Chief Complaint  Patient presents with  . Emesis    Chelsea [name] is a 74 y.o. female.  HPI     This is a 74 year old female with a history of diabetes, hypertension, shingles who presents with nausea vomiting.  Patient presents by EMS.  Patient reports that she started taking gabapentin on Friday for chronic neuropathic pain related to her shingles.  She states that since starting this medication she has had recurrent nausea and vomiting.   Symptoms started only after starting Neurontin.  She recently established primary care and was to start Neurontin, blood pressure medication, and diabetes medication.  However, she has only started the Neurontin and is due to start her blood pressure medication today.  It sounds as if the plan was to gradually add medications to make sure she tolerates the medications.  Patient denies any abdominal pain or diarrhea.  No change in bowel habits.  Denies chest pain, shortness of breath, cough.  Denies any speech difficulty, weakness, numbness, strokelike symptoms.  I have reviewed her primary care note which does indicate she was provided prescriptions for Metformin, blood pressure medication, and gabapentin.  Past Medical History:  Diagnosis Date  . Blind left eye   . Cardiac arrest (HCC)   . Diabetes mellitus without complication (HCC)   . Hypertension   . Stroke [facility])     There are no problems to display for this patient.   No past surgical history on file.   OB History   No obstetric history on file.     No family history on file.  Social History   Tobacco Use  . Smoking status: Current Every Day Smoker  . Smokeless tobacco: Never Used  Substance Use Topics  . Alcohol use: Not Currently  . Drug use: Never    Home Medications Prior to Admission medications   Medication Sig Start Date End Date Taking?  Authorizing Provider  CALCIUM PO Take by mouth.    [provider]  gabapentin (NEURONTIN) 100 MG capsule Take by mouth. 11/01/19   [provider]  hydrochlorothiazide (HYDRODIURIL) 25 MG tablet Take 1 tablet (25 mg total) by mouth daily. 10/30/19   Sabas Sous, MD  irbesartan (AVAPRO) 75 MG tablet Take 75 mg by mouth daily. Patient not taking: Reported on 11/05/2019 11/01/19   [provider]  MAGNESIUM PO Take by mouth.    [provider]  metFORMIN (GLUCOPHAGE) 500 MG tablet Take 1 tablet (500 mg total) by mouth 2 (two) times daily with a meal. Patient not taking: Reported on 11/05/2019 10/30/19 12/29/19  Sabas Sous, MD  Multiple Vitamin (MULTI-VITAMIN DAILY PO) Take by mouth.    [provider]  Multiple Vitamins-Minerals (ZINC PO) Take by mouth.    [provider]    Allergies    Codeine, Penicillins, Sulfa antibiotics, Benzodiazepines, and Lidocaine  Review of Systems   Review of Systems  Constitutional: Negative for fever.  Respiratory: Negative for cough and shortness of breath.   Cardiovascular: Negative for chest pain.  Gastrointestinal: Positive for nausea and vomiting. Negative for abdominal pain, constipation and diarrhea.  Genitourinary: Negative for dysuria.  Neurological: Negative for dizziness, weakness, light-headedness and numbness.  All other systems reviewed and are negative.   Physical Exam Updated Vital Signs BP (!) 189/74   Pulse 60   Temp 98.1 F (36.7 C) (  Oral)   Resp 14   Ht 1.702 m (5\' 7" )   Wt 64 kg   SpO2 98%   BMI 22.10 kg/m   Physical Exam Vitals and nursing note reviewed.  Constitutional:      Appearance: She is well-developed.     Comments: Elderly, non-ill-appearing  HENT:     Head: Normocephalic and atraumatic.     Nose: Nose normal.     Mouth/Throat:     Mouth: Mucous membranes are dry.  Eyes:     Pupils: Pupils are equal, round, and reactive to light.   Cardiovascular:     Rate and Rhythm: Normal rate and regular rhythm.     Heart sounds: Normal heart sounds.  Pulmonary:     Effort: Pulmonary effort is normal. No respiratory distress.     Breath sounds: No wheezing.  Abdominal:     General: Bowel sounds are normal.     Palpations: Abdomen is soft.     Tenderness: There is no abdominal tenderness. There is no guarding or rebound.  Musculoskeletal:     Cervical back: Neck supple.     Right lower leg: No edema.     Left lower leg: No edema.  Skin:    General: Skin is warm and dry.  Neurological:     Mental Status: She is alert and oriented to person, place, and time.     Comments: Cranial nerves II through XII intact, 5 out of 5 strength in all 4 extremities, no dysmetria to finger-nose-finger, patient ambulates independently with minimal assistance  Psychiatric:        Mood and Affect: Mood normal.     ED Results / Procedures / Treatments   Labs (all labs ordered are listed, but only abnormal results are displayed) Labs Reviewed  COMPREHENSIVE METABOLIC PANEL - Abnormal; Notable for the following components:      Result Value   Glucose, Bld 345 (*)    BUN 26 (*)    Calcium 8.7 (*)    Total Protein 6.0 (*)    Albumin 3.4 (*)    AST 14 (*)    All other components within normal limits  URINALYSIS, ROUTINE W REFLEX MICROSCOPIC - Abnormal; Notable for the following components:   APPearance HAZY (*)    Glucose, UA >=500 (*)    Hgb urine dipstick SMALL (*)    Protein, ur 100 (*)    Leukocytes,Ua LARGE (*)    WBC, UA >50 (*)    Bacteria, UA RARE (*)    Non Squamous Epithelial 0-5 (*)    All other components within normal limits  URINE CULTURE  CBC WITH DIFFERENTIAL/PLATELET  LIPASE, BLOOD    EKG EKG Interpretation  Date/Time:  Tuesday November 06 2019 01:47:18 EDT Ventricular Rate:  61 PR Interval:    QRS Duration: 103 QT Interval:  511 QTC Calculation: 515 R Axis:   -44 Text Interpretation: Sinus rhythm Left  atrial enlargement Abnormal R-wave progression, late transition LVH with secondary repolarization abnormality Prolonged QT interval Baseline wander in lead(s) I II aVR aVF No significant change since last tracing Confirmed by [id number] (Ross Marcus) on 11/06/2019 4:10:37 AM   Radiology No results found.  Procedures Procedures (including critical care time)  Medications Ordered in ED Medications  sodium chloride 0.9 % bolus 500 mL (0 mLs Intravenous Stopped 11/06/19 0356)    ED Course  I have reviewed the triage vital signs and the nursing notes.  Pertinent labs & imaging results that were available  during my care of the patient were reviewed by me and considered in my medical decision making (see chart for details).  Clinical Course as of Nov 06 502  Tue Nov 06, 2019  0501 Patient tolerating p.o.  She states that she feels better and only complains of some right shoulder pain.  Urinalysis reviewed and shows greater than 50 white cells and rare bacteria but it does appear to be a dirty specimen.  Patient is not having any urinary symptoms.  We will send culture but will hold treatment at this time.   [CH]  0501 Patient just establish primary care.  Per the daughter, they are attempting to add medications one at a time to see how the patient tolerates it.  She has not yet started her blood pressure medication and is due to start it today.  She has also not started her Metformin.  Encouraged her to call her primary office for further instructions.  However, would start her blood pressure medication today as planned.  Discontinue gabapentin.   [CH]    Clinical Course User Index [CH] Florence Yeung, Mayer Masker, MD   MDM Rules/Calculators/A&P                          Patient presents with isolated nausea and vomiting.  She does clinically appear dry.  She is nontoxic-appearing.  Vital signs notable for elevated blood pressure 193/66.  She has not started her blood pressure medication.  She  reports that her symptoms correlate in time to taking gabapentin.  She denies any abdominal pain or change in bowel habits to suggest intra-abdominal pathology such as obstruction, reflux, gastritis, cholecystitis.  She denies chest pain or shortness of breath.  EKG shows no significant changes from prior and no evidence of acute ischemia.  There are some changes related to hypertension.  Patient received fluids and Zofran by EMS.  She was given additional fluids IV.  Lab work reviewed.  No significant leukocytosis.  She does have elevated blood sugar at 345 but no anion gap to suggest DKA.  Lipase and empties are reassuring.  Urinalysis with greater than 50 white cells and bacteria; however, patient is otherwise asymptomatic and specimen does not appear clean.  We will culture and hold treatment at this time.  Per the daughter and the patient, she has had longstanding high blood sugars and blood pressure for which she has a plan with her primary physician to address.  She feels much better after fluids and Zofran.  Given correlation of symptoms with gabapentin, would recommend that she discontinue use of gabapentin follow-up with primary physician for alternatives.  Additionally, would start other medications as planned.  Patient is ambulatory and able to tolerate fluids prior to discharge.  After history, exam, and medical workup I feel the patient has been appropriately medically screened and is safe for discharge home. Pertinent diagnoses were discussed with the patient. Patient was given return precautions.    Final Clinical Impression(s) / ED Diagnoses Final diagnoses:  Non-intractable vomiting with nausea, unspecified vomiting type  Hyperglycemia  Essential hypertension    Rx / DC Orders ED Discharge Orders    None       Shon Baton, MD 11/06/19 [phone]

## 2019-11-06 NOTE — Discharge Instructions (Addendum)
You were seen today for nausea and vomiting.  Given that you have worsened symptoms after taking your gabapentin, this may be related.  Would discontinue using gabapentin and follow-up with your primary doctor regarding alternatives for pain management.  Additionally, you should follow-up closely regarding adding your additional meds.  Would start blood pressure medications as planned by your primary.

## 2019-11-06 NOTE — ED Triage Notes (Addendum)
C/o emesis x 4hrs. Pt recently started on Neurontin. Pt given IVF and 4mg  Zofran en route.

## 2019-11-07 ENCOUNTER — Other Ambulatory Visit: Payer: Self-pay

## 2019-11-07 ENCOUNTER — Encounter (HOSPITAL_COMMUNITY): Payer: Self-pay

## 2019-11-07 ENCOUNTER — Observation Stay (HOSPITAL_COMMUNITY): Payer: PPO

## 2019-11-07 ENCOUNTER — Inpatient Hospital Stay (HOSPITAL_COMMUNITY)
Admission: EM | Admit: 2019-11-07 | Discharge: 2019-11-09 | DRG: 065 | Disposition: A | Discharge status: Rehabilitation Facility | Payer: PPO | Attending: Internal Medicine | Admitting: Internal Medicine

## 2019-11-07 ENCOUNTER — Inpatient Hospital Stay (HOSPITAL_COMMUNITY): Payer: PPO

## 2019-11-07 DIAGNOSIS — R55 Syncope and collapse: Secondary | ICD-10-CM

## 2019-11-07 DIAGNOSIS — Z888 Allergy status to other drugs, medicaments and biological substances status: Secondary | ICD-10-CM | POA: Diagnosis not present

## 2019-11-07 DIAGNOSIS — Z7982 Long term (current) use of aspirin: Secondary | ICD-10-CM | POA: Diagnosis not present

## 2019-11-07 DIAGNOSIS — Z885 Allergy status to narcotic agent status: Secondary | ICD-10-CM

## 2019-11-07 DIAGNOSIS — Z20822 Contact with and (suspected) exposure to covid-19: Secondary | ICD-10-CM | POA: Diagnosis not present

## 2019-11-07 DIAGNOSIS — R32 Unspecified urinary incontinence: Secondary | ICD-10-CM | POA: Diagnosis present

## 2019-11-07 DIAGNOSIS — H5462 Unqualified visual loss, left eye, normal vision right eye: Secondary | ICD-10-CM | POA: Diagnosis not present

## 2019-11-07 DIAGNOSIS — H538 Other visual disturbances: Secondary | ICD-10-CM | POA: Diagnosis not present

## 2019-11-07 DIAGNOSIS — I63531 Cerebral infarction due to unspecified occlusion or stenosis of right posterior cerebral artery: Principal | ICD-10-CM | POA: Diagnosis present

## 2019-11-07 DIAGNOSIS — Z8673 Personal history of transient ischemic attack (TIA), and cerebral infarction without residual deficits: Secondary | ICD-10-CM | POA: Diagnosis not present

## 2019-11-07 DIAGNOSIS — Z9181 History of falling: Secondary | ICD-10-CM

## 2019-11-07 DIAGNOSIS — F17210 Nicotine dependence, cigarettes, uncomplicated: Secondary | ICD-10-CM | POA: Diagnosis not present

## 2019-11-07 DIAGNOSIS — I6522 Occlusion and stenosis of left carotid artery: Secondary | ICD-10-CM | POA: Diagnosis present

## 2019-11-07 DIAGNOSIS — Z823 Family history of stroke: Secondary | ICD-10-CM | POA: Diagnosis not present

## 2019-11-07 DIAGNOSIS — R414 Neurologic neglect syndrome: Secondary | ICD-10-CM | POA: Diagnosis not present

## 2019-11-07 DIAGNOSIS — Z880 Allergy status to penicillin: Secondary | ICD-10-CM

## 2019-11-07 DIAGNOSIS — I951 Orthostatic hypotension: Secondary | ICD-10-CM | POA: Diagnosis not present

## 2019-11-07 DIAGNOSIS — Z9114 Patient's other noncompliance with medication regimen: Secondary | ICD-10-CM

## 2019-11-07 DIAGNOSIS — B0229 Other postherpetic nervous system involvement: Secondary | ICD-10-CM | POA: Diagnosis present

## 2019-11-07 DIAGNOSIS — R29818 Other symptoms and signs involving the nervous system: Secondary | ICD-10-CM | POA: Diagnosis not present

## 2019-11-07 DIAGNOSIS — R42 Dizziness and giddiness: Secondary | ICD-10-CM | POA: Diagnosis not present

## 2019-11-07 DIAGNOSIS — I639 Cerebral infarction, unspecified: Secondary | ICD-10-CM | POA: Diagnosis not present

## 2019-11-07 DIAGNOSIS — R1110 Vomiting, unspecified: Secondary | ICD-10-CM | POA: Diagnosis not present

## 2019-11-07 DIAGNOSIS — E785 Hyperlipidemia, unspecified: Secondary | ICD-10-CM | POA: Diagnosis present

## 2019-11-07 DIAGNOSIS — R339 Retention of urine, unspecified: Secondary | ICD-10-CM | POA: Diagnosis present

## 2019-11-07 DIAGNOSIS — R739 Hyperglycemia, unspecified: Secondary | ICD-10-CM

## 2019-11-07 DIAGNOSIS — R296 Repeated falls: Secondary | ICD-10-CM | POA: Diagnosis not present

## 2019-11-07 DIAGNOSIS — F419 Anxiety disorder, unspecified: Secondary | ICD-10-CM | POA: Diagnosis present

## 2019-11-07 DIAGNOSIS — F17200 Nicotine dependence, unspecified, uncomplicated: Secondary | ICD-10-CM | POA: Diagnosis not present

## 2019-11-07 DIAGNOSIS — I6389 Other cerebral infarction: Secondary | ICD-10-CM | POA: Diagnosis not present

## 2019-11-07 DIAGNOSIS — I6523 Occlusion and stenosis of bilateral carotid arteries: Secondary | ICD-10-CM | POA: Diagnosis present

## 2019-11-07 DIAGNOSIS — I10 Essential (primary) hypertension: Secondary | ICD-10-CM

## 2019-11-07 DIAGNOSIS — E1142 Type 2 diabetes mellitus with diabetic polyneuropathy: Secondary | ICD-10-CM | POA: Diagnosis not present

## 2019-11-07 DIAGNOSIS — N390 Urinary tract infection, site not specified: Secondary | ICD-10-CM | POA: Diagnosis not present

## 2019-11-07 DIAGNOSIS — I6381 Other cerebral infarction due to occlusion or stenosis of small artery: Secondary | ICD-10-CM | POA: Diagnosis present

## 2019-11-07 DIAGNOSIS — G253 Myoclonus: Secondary | ICD-10-CM | POA: Diagnosis present

## 2019-11-07 DIAGNOSIS — Z8249 Family history of ischemic heart disease and other diseases of the circulatory system: Secondary | ICD-10-CM

## 2019-11-07 DIAGNOSIS — I160 Hypertensive urgency: Secondary | ICD-10-CM | POA: Diagnosis not present

## 2019-11-07 DIAGNOSIS — R29898 Other symptoms and signs involving the musculoskeletal system: Secondary | ICD-10-CM | POA: Diagnosis not present

## 2019-11-07 DIAGNOSIS — Z9119 Patient's noncompliance with other medical treatment and regimen: Secondary | ICD-10-CM

## 2019-11-07 DIAGNOSIS — R52 Pain, unspecified: Secondary | ICD-10-CM | POA: Diagnosis not present

## 2019-11-07 DIAGNOSIS — G629 Polyneuropathy, unspecified: Secondary | ICD-10-CM | POA: Diagnosis present

## 2019-11-07 DIAGNOSIS — Z882 Allergy status to sulfonamides status: Secondary | ICD-10-CM

## 2019-11-07 DIAGNOSIS — R471 Dysarthria and anarthria: Secondary | ICD-10-CM | POA: Diagnosis present

## 2019-11-07 DIAGNOSIS — Z79899 Other long term (current) drug therapy: Secondary | ICD-10-CM

## 2019-11-07 DIAGNOSIS — B9620 Unspecified Escherichia coli [E. coli] as the cause of diseases classified elsewhere: Secondary | ICD-10-CM | POA: Diagnosis not present

## 2019-11-07 DIAGNOSIS — I6502 Occlusion and stenosis of left vertebral artery: Secondary | ICD-10-CM | POA: Diagnosis present

## 2019-11-07 DIAGNOSIS — E1165 Type 2 diabetes mellitus with hyperglycemia: Secondary | ICD-10-CM | POA: Diagnosis not present

## 2019-11-07 DIAGNOSIS — E860 Dehydration: Secondary | ICD-10-CM | POA: Diagnosis not present

## 2019-11-07 DIAGNOSIS — Z7984 Long term (current) use of oral hypoglycemic drugs: Secondary | ICD-10-CM | POA: Diagnosis not present

## 2019-11-07 DIAGNOSIS — R0689 Other abnormalities of breathing: Secondary | ICD-10-CM | POA: Diagnosis not present

## 2019-11-07 DIAGNOSIS — E1169 Type 2 diabetes mellitus with other specified complication: Secondary | ICD-10-CM | POA: Diagnosis not present

## 2019-11-07 DIAGNOSIS — I672 Cerebral atherosclerosis: Secondary | ICD-10-CM | POA: Diagnosis present

## 2019-11-07 DIAGNOSIS — R112 Nausea with vomiting, unspecified: Secondary | ICD-10-CM

## 2019-11-07 DIAGNOSIS — R338 Other retention of urine: Secondary | ICD-10-CM | POA: Diagnosis not present

## 2019-11-07 DIAGNOSIS — K5900 Constipation, unspecified: Secondary | ICD-10-CM | POA: Diagnosis not present

## 2019-11-07 DIAGNOSIS — E669 Obesity, unspecified: Secondary | ICD-10-CM | POA: Diagnosis not present

## 2019-11-07 DIAGNOSIS — Z466 Encounter for fitting and adjustment of urinary device: Secondary | ICD-10-CM | POA: Diagnosis not present

## 2019-11-07 DIAGNOSIS — I69354 Hemiplegia and hemiparesis following cerebral infarction affecting left non-dominant side: Secondary | ICD-10-CM | POA: Diagnosis not present

## 2019-11-07 LAB — RESPIRATORY PANEL BY RT PCR (FLU A&B, COVID)
Influenza A by PCR: NEGATIVE
Influenza B by PCR: NEGATIVE
SARS Coronavirus 2 by RT PCR: NEGATIVE

## 2019-11-07 LAB — URINALYSIS, ROUTINE W REFLEX MICROSCOPIC
Bacteria, UA: NONE SEEN
Bilirubin Urine: NEGATIVE
Glucose, UA: >=500 mg/dL — AB
Hgb urine dipstick: NEGATIVE
Ketones, ur: NEGATIVE mg/dL
Leukocytes,Ua: NEGATIVE
Nitrite: NEGATIVE
Protein, ur: 100 mg/dL — AB
Specific Gravity, Urine: 1.01 (ref 1.005–1.030)
pH: 7 (ref 5.0–8.0)

## 2019-11-07 LAB — CBC
HCT: 36.3 % (ref 36.0–46.0)
Hemoglobin: 12.2 g/dL (ref 12.0–15.0)
MCH: 31.3 pg (ref 26.0–34.0)
MCHC: 33.6 g/dL (ref 30.0–36.0)
MCV: 93.1 fL (ref 80.0–100.0)
Platelets: 299 10*3/uL (ref 150–400)
RBC: 3.9 MIL/uL (ref 3.87–5.11)
RDW: 12 % (ref 11.5–15.5)
WBC: 11.1 10*3/uL — ABNORMAL HIGH (ref 4.0–10.5)
nRBC: 0 % (ref 0.0–0.2)

## 2019-11-07 LAB — GLUCOSE, CAPILLARY
Glucose-Capillary: 215 mg/dL — ABNORMAL HIGH (ref 70–99)
Glucose-Capillary: 137 mg/dL — ABNORMAL HIGH (ref 70–99)

## 2019-11-07 LAB — COMPREHENSIVE METABOLIC PANEL
ALT: 14 U/L (ref 0–44)
AST: 17 U/L (ref 15–41)
Albumin: 3.3 g/dL — ABNORMAL LOW (ref 3.5–5.0)
Alkaline Phosphatase: 78 U/L (ref 38–126)
Anion gap: 7 (ref 5–15)
BUN: 15 mg/dL (ref 8–23)
CO2: 27 mmol/L (ref 22–32)
Calcium: 8.9 mg/dL (ref 8.9–10.3)
Chloride: 98 mmol/L (ref 98–111)
Creatinine, Ser: 0.82 mg/dL (ref 0.44–1.00)
GFR, Estimated: >60 mL/min (ref >60)
Glucose, Bld: 260 mg/dL — ABNORMAL HIGH (ref 70–99)
Potassium: 3.9 mmol/L (ref 3.5–5.1)
Sodium: 132 mmol/L — ABNORMAL LOW (ref 135–145)
Total Bilirubin: 0.5 mg/dL (ref 0.3–1.2)
Total Protein: 5.8 g/dL — ABNORMAL LOW (ref 6.5–8.1)

## 2019-11-07 LAB — DIFFERENTIAL
Abs Immature Granulocytes: 0.04 10*3/uL (ref 0.00–0.07)
Basophils Absolute: 0.1 10*3/uL (ref 0.0–0.1)
Basophils Relative: 1 %
Eosinophils Absolute: 0.1 10*3/uL (ref 0.0–0.5)
Eosinophils Relative: 1 %
Immature Granulocytes: 0 %
Lymphocytes Relative: 16 %
Lymphs Abs: 1.8 10*3/uL (ref 0.7–4.0)
Monocytes Absolute: 0.6 10*3/uL (ref 0.1–1.0)
Monocytes Relative: 5 %
Neutro Abs: 8.6 10*3/uL — ABNORMAL HIGH (ref 1.7–7.7)
Neutrophils Relative %: 77 %

## 2019-11-07 LAB — CBG MONITORING, ED
Glucose-Capillary: 240 mg/dL — ABNORMAL HIGH (ref 70–99)
Glucose-Capillary: 188 mg/dL — ABNORMAL HIGH (ref 70–99)
Glucose-Capillary: 88 mg/dL (ref 70–99)
Glucose-Capillary: 163 mg/dL — ABNORMAL HIGH (ref 70–99)

## 2019-11-07 LAB — ECHOCARDIOGRAM COMPLETE BUBBLE STUDY
Area-P 1/2: 1.87 cm2
S' Lateral: 3.36 cm

## 2019-11-07 LAB — TROPONIN I (HIGH SENSITIVITY)
Troponin I (High Sensitivity): 8 ng/L (ref <18)
Troponin I (High Sensitivity): 8 ng/L (ref <18)

## 2019-11-07 MED ORDER — DIPHENHYDRAMINE HCL 50 MG/ML IJ SOLN
12.5000 mg | Freq: Three times a day (TID) | INTRAMUSCULAR | Status: DC | PRN
  Administered 2019-11-07: 12.5 mg via INTRAVENOUS
  Filled 2019-11-07 (×2): qty 1

## 2019-11-07 MED ORDER — ONDANSETRON HCL 4 MG PO TABS: 4.0000 mg | ORAL_TABLET | Freq: Four times a day (QID) | ORAL | Status: DC | PRN

## 2019-11-07 MED ORDER — ASPIRIN EC 81 MG PO TBEC
81.0000 mg | DELAYED_RELEASE_TABLET | Freq: Every day | ORAL | Status: DC
  Administered 2019-11-07: 81 mg via ORAL
  Filled 2019-11-07 (×2): qty 1

## 2019-11-07 MED ORDER — ATORVASTATIN CALCIUM 80 MG PO TABS
80.0000 mg | ORAL_TABLET | Freq: Every day | ORAL | Status: DC
  Administered 2019-11-07 – 2019-11-09 (×3): 80 mg via ORAL
  Filled 2019-11-07 (×3): qty 1

## 2019-11-07 MED ORDER — LACTATED RINGERS IV BOLUS
500.0000 mL | Freq: Once | INTRAVENOUS | Status: AC
  Administered 2019-11-07: 500 mL via INTRAVENOUS

## 2019-11-07 MED ORDER — LACTATED RINGERS IV SOLN: INTRAVENOUS | Status: AC

## 2019-11-07 MED ORDER — STROKE: EARLY STAGES OF RECOVERY BOOK
Freq: Once | Status: AC
  Filled 2019-11-07: qty 1

## 2019-11-07 MED ORDER — ONDANSETRON HCL 4 MG/2ML IJ SOLN
4.0000 mg | Freq: Once | INTRAMUSCULAR | Status: AC
  Administered 2019-11-07: 4 mg via INTRAVENOUS
  Filled 2019-11-07: qty 2

## 2019-11-07 MED ORDER — ACETAMINOPHEN 325 MG PO TABS: 650.0000 mg | ORAL_TABLET | Freq: Four times a day (QID) | ORAL | Status: DC | PRN

## 2019-11-07 MED ORDER — INSULIN ASPART 100 UNIT/ML ~~LOC~~ SOLN
0.0000 [IU] | Freq: Three times a day (TID) | SUBCUTANEOUS | Status: DC
  Administered 2019-11-07: 3 [IU] via SUBCUTANEOUS
  Administered 2019-11-07: 5 [IU] via SUBCUTANEOUS
  Administered 2019-11-08: 3 [IU] via SUBCUTANEOUS
  Administered 2019-11-08: 2 [IU] via SUBCUTANEOUS
  Administered 2019-11-09 (×2): 3 [IU] via SUBCUTANEOUS
  Administered 2019-11-09: 2 [IU] via SUBCUTANEOUS

## 2019-11-07 MED ORDER — POLYETHYLENE GLYCOL 3350 17 G PO PACK: 17.0000 g | PACK | Freq: Every day | ORAL | Status: DC | PRN

## 2019-11-07 MED ORDER — ONDANSETRON HCL 4 MG/2ML IJ SOLN
4.0000 mg | Freq: Four times a day (QID) | INTRAMUSCULAR | Status: DC | PRN
  Administered 2019-11-07 – 2019-11-09 (×4): 4 mg via INTRAVENOUS
  Filled 2019-11-07 (×4): qty 2

## 2019-11-07 MED ORDER — PROMETHAZINE HCL 25 MG/ML IJ SOLN
12.5000 mg | Freq: Four times a day (QID) | INTRAMUSCULAR | Status: DC | PRN
  Administered 2019-11-07 – 2019-11-08 (×3): 12.5 mg via INTRAVENOUS
  Filled 2019-11-07 (×3): qty 1

## 2019-11-07 MED ORDER — ENOXAPARIN SODIUM 40 MG/0.4ML ~~LOC~~ SOLN
40.0000 mg | Freq: Every day | SUBCUTANEOUS | Status: DC
  Administered 2019-11-07 – 2019-11-09 (×3): 40 mg via SUBCUTANEOUS
  Filled 2019-11-07 (×3): qty 0.4

## 2019-11-07 MED ORDER — ACETAMINOPHEN 650 MG RE SUPP: 650.0000 mg | Freq: Four times a day (QID) | RECTAL | Status: DC | PRN

## 2019-11-07 MED ORDER — HYDRALAZINE HCL 20 MG/ML IJ SOLN
10.0000 mg | Freq: Four times a day (QID) | INTRAMUSCULAR | Status: DC | PRN
  Administered 2019-11-07 – 2019-11-08 (×3): 10 mg via INTRAVENOUS
  Filled 2019-11-07 (×3): qty 1

## 2019-11-07 MED ORDER — IOHEXOL 350 MG/ML SOLN
75.0000 mL | Freq: Once | INTRAVENOUS | Status: AC | PRN
  Administered 2019-11-07: 75 mL via INTRAVENOUS

## 2019-11-07 NOTE — ED Provider Notes (Signed)
[facility] EMERGENCY DEPARTMENT Provider Note   CSN: [id number] Arrival date & time: 11/07/19  0301     History Chief Complaint  Patient presents with  . Hypertension  . Hyperglycemia    Chelsea [name] is a 74 y.o. female.  The history is provided by the patient and a relative.  Weakness Severity:  Moderate Onset quality:  Gradual Timing:  Intermittent Progression:  Worsening Chronicity:  New Relieved by:  Nothing Worsened by:  Nothing Associated symptoms: loss of consciousness, nausea, syncope and vomiting   Associated symptoms: no chest pain, no fever and no seizures   Patient with history of diabetes, hypertension presents with hyperglycemia and generalized weakness.  Patient reports his symptoms been ongoing for several days and are worsening.  She reports with any movement she feels dizzy and has dry heaving She is also been having headaches.  She also think she has been having brief syncopal episodes.  No seizures.  She has difficulty controlling her blood glucose. No focal arm or leg weakness.    Past Medical History:  Diagnosis Date  . Blind left eye   . Cardiac arrest (HCC)   . Diabetes mellitus without complication (HCC)   . Hypertension   . Stroke [facility])     There are no problems to display for this patient.   History reviewed. No pertinent surgical history.   OB History   No obstetric history on file.     History reviewed. No pertinent family history.  Social History   Tobacco Use  . Smoking status: Current Every Day Smoker  . Smokeless tobacco: Never Used  Substance Use Topics  . Alcohol use: Not Currently  . Drug use: Never    Home Medications Prior to Admission medications   Medication Sig Start Date End Date Taking? Authorizing Provider  ascorbic acid (VITAMIN C) 500 MG tablet Take 500 mg by mouth daily.   Yes [provider]  aspirin EC 81 MG tablet Take 81 mg by mouth daily. Swallow whole.   Yes  [provider]  Calcium-Magnesium-Zinc (CAL-MAG-ZINC PO) Take 1 tablet by mouth daily.   Yes [provider]  Multiple Vitamin (MULTI-VITAMIN DAILY PO) Take by mouth.   Yes [provider]  hydrochlorothiazide (HYDRODIURIL) 25 MG tablet Take 1 tablet (25 mg total) by mouth daily. 10/30/19   Sabas Sous, MD  metFORMIN (GLUCOPHAGE) 500 MG tablet Take 1 tablet (500 mg total) by mouth 2 (two) times daily with a meal. Patient not taking: Reported on 11/05/2019 10/30/19 12/29/19  Sabas Sous, MD    Allergies    Codeine, Gabapentin, Penicillins, Sulfa antibiotics, Benzodiazepines, and Lidocaine  Review of Systems   Review of Systems  Constitutional: Negative for fever.  Cardiovascular: Positive for syncope. Negative for chest pain.  Gastrointestinal: Positive for nausea and vomiting.  Neurological: Positive for loss of consciousness, syncope and weakness. Negative for seizures.  All other systems reviewed and are negative.   Physical Exam Updated Vital Signs BP (!) 216/79 (BP Location: Right Arm)   Pulse (!) 58   Temp 98 F (36.7 C) (Oral)   Resp 14   SpO2 97%   Physical Exam CONSTITUTIONAL: Elderly, ill-appearing, patient actively dry heaving in the room HEAD: Normocephalic/atraumatic EYES: EOMI/PERRL, no nystagmus, no ptosis ENMT: Mucous membranes moist NECK: supple no meningeal signs, no bruits CV: S1/S2 noted, no murmurs/rubs/gallops noted LUNGS: Lungs are clear to auscultation bilaterally, no apparent distress ABDOMEN: soft, nontender, no rebound or guarding GU:no  cva tenderness NEURO:Awake/alert, face symmetric, no arm or leg drift is noted Equal 5/5 strength with shoulder abduction, elbow flex/extension, wrist flex/extension in upper extremities and equal hand grips bilaterally Equal 5/5 strength with hip flexion,knee flex/extension, foot dorsi/plantar flexion Cranial nerves 3/4/5/6/07/19/08/11/12 tested and intact Sensation to light touch  intact in all extremities EXTREMITIES: pulses normal, full ROM SKIN: warm, color normal PSYCH: no abnormalities of mood noted   ED Results / Procedures / Treatments   Labs (all labs ordered are listed, but only abnormal results are displayed) Labs Reviewed  CBC - Abnormal; Notable for the following components:      Result Value   WBC 11.1 (*)    All other components within normal limits  DIFFERENTIAL - Abnormal; Notable for the following components:   Neutro Abs 8.6 (*)    All other components within normal limits  COMPREHENSIVE METABOLIC PANEL - Abnormal; Notable for the following components:   Sodium 132 (*)    Glucose, Bld 260 (*)    Total Protein 5.8 (*)    Albumin 3.3 (*)    All other components within normal limits  CBG MONITORING, ED - Abnormal; Notable for the following components:   Glucose-Capillary 240 (*)    All other components within normal limits  RESPIRATORY PANEL BY RT PCR (FLU A&B, COVID)  URINALYSIS, ROUTINE W REFLEX MICROSCOPIC  TROPONIN I (HIGH SENSITIVITY)  TROPONIN I (HIGH SENSITIVITY)    EKG EKG Interpretation  Date/Time:  Wednesday November 07 2019 03:05:20 EDT Ventricular Rate:  58 PR Interval:    QRS Duration: 102 QT Interval:  490 QTC Calculation: 482 R Axis:   -39 Text Interpretation: Sinus rhythm Left atrial enlargement LVH with secondary repolarization abnormality No significant change since last tracing Confirmed by Zadie Rhine ([id number]) on 11/07/2019 3:10:18 AM   Radiology No results found.  Procedures Procedures   Medications Ordered in ED Medications  ondansetron (ZOFRAN) injection 4 mg (4 mg Intravenous Given 11/07/19 0340)    ED Course  I have reviewed the triage vital signs and the nursing notes.  Pertinent labs  results that were available during my care of the patient were reviewed by me and considered in my medical decision making (see chart for details).    MDM Rules/Calculators/A&P                           4:04 AM Patient presents with frequent episodes of nausea and dry heaving as well as elevated blood pressure and difficulty control glucose.  Patient has had 2 recent ER visits, but reports she is worsening at home.  She was also seen recently as an outpatient by vascular surgery and found to have critical stenosis of the left carotid artery. She is currently awake alert but ill-appearing.  There is no gross neuro deficits to suggest acute stroke.  She does report nausea with any movement Patient had recent CT head was without acute findings Patient also reports she is having frequent syncopal episodes. Patient would likely benefit from a medical admission. 5:23 AM Patient reports improvement with Zofran. However this is patient's third ER visit in the past 8 days.  She is having difficulty managing her symptoms at home.  Discussed with Dr. Leafy Half for admission to the medical service.  Patient does report what sounds to be vertiginous symptoms with nausea.  She may  need an MRI brain as she had a recent negative CT head   Final Clinical Impression(s) /  ED Diagnoses Final diagnoses:  Hyperglycemia  Syncope and collapse  Primary hypertension    Rx / DC Orders ED Discharge Orders    None       Zadie Rhine, MD 11/07/19 [phone]

## 2019-11-07 NOTE — Consult Note (Addendum)
Neurology Consultation  Reason for Consult: Stroke Referring Physician: Jonetta Osgood, MD  CC: Loss of vision in the left peripheral field  History is obtained from: Majority from notes some from patient  HPI: Chelsea [name] is a 74 y.o. female with history of stroke, hypertension, diabetes, cardiac arrest.  Patient initially came to the ED secondary to hypertension hyperglycemia.  As noted in the chart patient and daughter are poor historians.  While in the ED it was found that patient had hypertensive urgency with blood pressures at 216/79.  Talking to daughter and patient it appears that she has not been taking her medications as prescribed however it is confusing as they change their story multiple times.  Patient has been already diagnosed with left carotid stenosis of 80% and Dr. Donnetta Hutching from vascular clinic has already schedule patient for a carotid endarterectomy in late November, given if her blood pressure is under control.  Per patient 6 days ago on 11/01/2019 she felt as though she lost vision in her left eye.  While in the ED patient did have MRI of brain which showed acute or subacute infarction in the right hippocampus without hemorrhage.  CTA confirmed 80% focal noncalcified stenosis in the proximal left internal carotid artery.  The right internal carotid artery was shown to have no significant stenosis.  Neurology was asked see patient secondary to acute/subacute infarct in the right hippocampus.  Currently patient remains hypertensive with blood pressure 216/79.  LKW: 22 tpa given?: no, out of window Premorbid modified Rankin scale (mRS): 1 NIHSS 1a Level of Conscious.: 0 1b LOC Questions: 0 1c LOC Commands: 0 2 Best Gaze: 0 3 Visual: 2 4 Facial Palsy: 0 5a Motor Arm - left: 0 5b Motor Arm - Right: 0 6a Motor Leg - Left: 0 6b Motor Leg - Right: 1 7 Limb Ataxia: 2 8 Sensory: 0 9 Best Language: 0 10 Dysarthria: 0 11 Extinct. and Inatten.: 0 TOTAL: 2  Past  Medical History:  Diagnosis Date  . Blind left eye   . Cardiac arrest ([place])   . Diabetes mellitus without complication ([place])   . Hypertension   . Stroke [facility])     Family History  Problem Relation Age of Onset  . Heart attack Mother   . Stroke Father    Social History:   reports that she has been smoking. She has never used smokeless tobacco. She reports previous alcohol use. She reports that she does not use drugs.  Medications  Current Facility-Administered Medications:  .   stroke: mapping our early stages of recovery book, , Does not apply, Once, Shalhoub, Sherryll Burger, MD .  acetaminophen (TYLENOL) tablet 650 mg, 650 mg, Oral, Q6H PRN **OR** acetaminophen (TYLENOL) suppository 650 mg, 650 mg, Rectal, Q6H PRN, Shalhoub, Sherryll Burger, MD .  aspirin EC tablet 81 mg, 81 mg, Oral, Daily, Shalhoub, Sherryll Burger, MD .  atorvastatin (LIPITOR) tablet 80 mg, 80 mg, Oral, Daily, Ghimire, Shanker M, MD .  enoxaparin (LOVENOX) injection 40 mg, 40 mg, Subcutaneous, Daily, Shalhoub, Sherryll Burger, MD .  hydrALAZINE (APRESOLINE) injection 10 mg, 10 mg, Intravenous, Q6H PRN, Shalhoub, Sherryll Burger, MD .  insulin aspart (novoLOG) injection 0-15 Units, 0-15 Units, Subcutaneous, TID AC & HS, Shalhoub, Sherryll Burger, MD, 3 Units at 11/07/19 1029 .  [COMPLETED] lactated ringers bolus 500 mL, 500 mL, Intravenous, Once, Last Rate: 500 mL/hr at 11/07/19 1027, 500 mL at 11/07/19 1027 **FOLLOWED BY** lactated ringers infusion, , Intravenous, Continuous, Ghimire, Shanker M,  MD, Last Rate: 10 mL/hr at 11/07/19 1030, New Bag at 11/07/19 1030 .  ondansetron (ZOFRAN) tablet 4 mg, 4 mg, Oral, Q6H PRN **OR** ondansetron (ZOFRAN) injection 4 mg, 4 mg, Intravenous, Q6H PRN, Shalhoub, Sherryll Burger, MD .  polyethylene glycol (MIRALAX / GLYCOLAX) packet 17 g, 17 g, Oral, Daily PRN, Shalhoub, Sherryll Burger, MD  Current Outpatient Medications:  .  ascorbic acid (VITAMIN C) 500 MG tablet, Take 500 mg by mouth daily., Disp: , Rfl:  .  aspirin EC 81 MG  tablet, Take 81 mg by mouth daily. Swallow whole., Disp: , Rfl:  .  Calcium-Magnesium-Zinc (CAL-MAG-ZINC PO), Take 1 tablet by mouth daily., Disp: , Rfl:  .  Multiple Vitamin (MULTI-VITAMIN DAILY PO), Take by mouth., Disp: , Rfl:  .  hydrochlorothiazide (HYDRODIURIL) 25 MG tablet, Take 1 tablet (25 mg total) by mouth daily., Disp: 30 tablet, Rfl: 1 .  metFORMIN (GLUCOPHAGE) 500 MG tablet, Take 1 tablet (500 mg total) by mouth 2 (two) times daily with a meal. (Patient not taking: Reported on 11/05/2019), Disp: 60 tablet, Rfl: 1  ROS:    General ROS: negative for - chills, fatigue, fever, night sweats, weight gain or weight loss Ophthalmic ROS: Positive for -  loss of vision ENT ROS: negative for - epistaxis, nasal discharge, oral lesions, sore throat, tinnitus or vertigo Endocrine ROS: negative for - galactorrhea, hair pattern changes, polydipsia/polyuria or temperature intolerance Respiratory ROS: negative for - cough, hemoptysis, shortness of breath or wheezing Cardiovascular ROS: negative for - chest pain, dyspnea on exertion, edema or irregular heartbeat Gastrointestinal ROS: Positive for - nausea/vomiting  Genito-Urinary ROS: negative for - dysuria, hematuria, incontinence or urinary frequency/urgency Musculoskeletal ROS: Positive for - muscular weakness Neurological ROS: as noted in HPI Dermatological ROS: negative for rash and skin lesion changes  Exam: Current vital signs: BP (!) 185/76   Pulse (!) 56   Temp 98 F (36.7 C) (Oral)   Resp 18   SpO2 99%  Vital signs in last 24 hours: Temp:  [98 F (36.7 C)] 98 F (36.7 C) (10/27 0304) Pulse Rate:  [56-61] 56 (10/27 1024) Resp:  [13-22] 18 (10/27 1024) BP: (162-216)/(66-79) 185/76 (10/27 1024) SpO2:  [94 %-99 %] 99 % (10/27 1024)   Constitutional: Appears well-developed and well-nourished.  Eyes: No scleral injection HENT: No OP obstrucion Head: Normocephalic.  Cardiovascular: Palpable Respiratory: Effort normal,  non-labored breathing GI: Soft.  No distension. There is no tenderness.  Skin: WDI  Neuro: Mental Status: Patient is awake, alert, oriented to person, place, month.  Patient shows no dysarthria or aphasia.  Patient is able to follow simple commands. Cranial Nerves: II: Left hemianopsia III,IV, VI: EOMI with saccadic movements but without ptosis or diploplia. Pupils equal, round and reactive to light V: Facial sensation is symmetric to temperature VII: Facial movement is symmetric.  VIII: hearing is intact to voice X: Palat elevates symmetrically XI: Shoulder shrug is symmetric. XII: tongue is midline without atrophy or fasciculations.  Motor: Tone is normal. Bulk is normal. 5/5 strength was present in all four extremities.  Drift-bilateral lower extremities Sensory: Sensation is symmetric to light touch and temperature in the arms and legs. DSS intact Deep Tendon Reflexes: 2+ and symmetric in the biceps and patellae.  Plantars: Right toe downgoing left toe upgoing Cerebellar: FNF with mild tremulousness bilaterally without passpointing.   Labs I have reviewed labs in epic and the results pertinent to this consultation are:  CBC    Component Value Date/Time  WBC 11.1 (H) 11/07/2019 0339   RBC 3.90 11/07/2019 0339   HGB 12.2 11/07/2019 0339   HCT 36.3 11/07/2019 0339   PLT 299 11/07/2019 0339   MCV 93.1 11/07/2019 0339   MCH 31.3 11/07/2019 0339   MCHC 33.6 11/07/2019 0339   RDW 12.0 11/07/2019 0339   LYMPHSABS 1.8 11/07/2019 0339   MONOABS 0.6 11/07/2019 0339   EOSABS 0.1 11/07/2019 0339   BASOSABS 0.1 11/07/2019 0339    CMP     Component Value Date/Time   NA 132 (L) 11/07/2019 0339   K 3.9 11/07/2019 0339   CL 98 11/07/2019 0339   CO2 27 11/07/2019 0339   GLUCOSE 260 (H) 11/07/2019 0339   BUN 15 11/07/2019 0339   CREATININE 0.82 11/07/2019 0339   CALCIUM 8.9 11/07/2019 0339   PROT 5.8 (L) 11/07/2019 0339   ALBUMIN 3.3 (L) 11/07/2019 0339   AST 17  11/07/2019 0339   ALT 14 11/07/2019 0339   ALKPHOS 78 11/07/2019 0339   BILITOT 0.5 11/07/2019 0339   GFRNONAA >60 11/07/2019 0339   GFRAA  09/17/2008 1612    >60        The eGFR has been calculated using the MDRD equation. This calculation has not been validated in all clinical situations. eGFR's persistently <60 mL/min signify possible Chronic Kidney Disease.    Imaging I have reviewed the images obtained:  CTA head and neck-occluded left vertebral artery at origin.  Right vertebral artery widely patent to the basilar.  80% focal noncalcified stenosis of proximal left internal carotid artery.  Right carotid artery without significant stenosis.  Diffuse intracranial atherosclerotic disease most severe in the posterior cerebral arteries bilaterally.  MRI examination of the brain-acute or subacute infarction in the right hippocampus without hemorrhage.  Mild chronic ischemic changes.  Areas of chronic microhemorrhage including left parietal infarct which is small.  Etta Quill PA-C [facility] [phone]  M-F  (9:00 am- 5:00 PM)  11/07/2019, 12:04 PM     Assessment:  This is a 74 year old female with multiple stroke risk factors with right medial temporal infarct.  She is being admited for therapy evaluations and secondary risk factor modification.  Impression: -Right hippocampal acute/subacute infarct  Recommend -Transthoracic Echo  -Start patient on ASA 321m daily  -Start Atorvastatin 80 mg/other high intensity statin -BP goal: Systolic less than 1834-HBAIC and Lipid profile -Telemetry monitoring -Frequent neuro checks -NPO until passes stroke swallow screen -PT/OT # please page stroke NP  Or  PA  Or MD from 8am -4 pm  as this patient from this time will be  followed by the stroke.   You can look them up on [email or web address]  Password [email or web address]   MRoland Rack MD [facility] [phone] If 7pm- 7am, please page neurology [place] call [place]  [place]

## 2019-11-07 NOTE — Progress Notes (Signed)
  Echocardiogram 2D Echocardiogram has been performed.  Chelsea [name] Chelsea [name] 11/07/2019, 11:52 AM

## 2019-11-07 NOTE — Progress Notes (Signed)
Informed by RN-patient again vomiting-patient reporting "whole head" swollen-feels tongue is swollen. Reports numerous allergies in the past-claims she has "flatlined"   On exam: In some discomfort due to vomiting.Speech clear Answering questions appropriately No tongue swelling-or post pharyngeal wall edema  Plan Continue observation-do not see any evidence of angioedema currently Add promethazine for intractable vomiting Will follow

## 2019-11-07 NOTE — Progress Notes (Addendum)
PROGRESS NOTE        PATIENT DETAILS Name: Chelsea [name] Age: 74 y.o. Sex: female Date of Birth: 03-21-1945 Admit Date: 11/07/2019 Admitting Physician Marinda Elk, MD ZOX:WRUEAV, Jovita Gamma, DO  Brief Narrative: Patient is a 74 y.o. female who has not seen a MD for almost 15 years-recently diagnosed with HTN, DM-2, left carotid artery stenosis (apparently scheduled for carotid endarterectomy late November)-presenting with 1 week history of frequent falls, subtle right lower extremity weakness, nausea and vomiting.   Significant events: None  Significant studies: 10/27>> MRI brain: Acute/subacute infarct in the right hippocampus without hemorrhage. 10/27>> CTA head/neck: Left vertebral artery occlusion, 80% stenosis at the left internal carotid artery, diffuse intracranial atherosclerotic disease. 10/27>> x-ray abdomen: Nonobstructive bowel gas pattern  Antimicrobial therapy: None  Microbiology data: None  Procedures : None  Consults: Neurology  DVT Prophylaxis : enoxaparin (LOVENOX) injection 40 mg Start: 11/07/19 1000   Subjective: Continues to have vomiting-dizziness-and intermittent headaches.  Appears comfortable at rest.  Dizziness appears to be positional at times.  Assessment/Plan: Acute CVA: Subtle right leg weakness-but no otherwise evident deficits-continue aspirin-add high intensity statin-await echo/LDL/A1c and rehab services evaluation.  Has significant intracranial-and left carotid artery stenosis on CT imaging-we will await further recommendations from neurology/stroke team.  Uncontrolled HTN: Allow permissive hypertension-recently diagnosed with hypertension and started on antihypertensives which she has not taken due to onset of nausea/vomiting.  Will gradually lower BP-stop IV fluids.  Nausea/Vomiting: not sure if related to CVA/Uncontrolled HTN-belly is benign-continue supportive care with prn anti-emetics-check lipase  with am labs.   DM-2: A1c pending-CBGs relatively stable with SSI.  Recently diagnosed with DM-2-started on Metformin which she has not yet taken due to onset of nausea/vomiting.  Recent Labs    11/07/19 0303 11/07/19 0752  GLUCAP 240* 188*   History of postherpetic neuralgia: Hold Neurontin  Tobacco abuse: Counseled   Diet: Diet Order            Diet clear liquid Room service appropriate? Yes; Fluid consistency: Thin  Diet effective now                  Code Status: Full code   Family Communication: Daughter at bedside  Disposition Plan: Home vs Home with Home health vs SNF when ready for discharge Status is: Observation  The patient will require care spanning > 2 midnights and should be moved to inpatient because: Inpatient level of care appropriate due to severity of illness  Dispo: The patient is from: Home              Anticipated d/c is to: TBD              Anticipated d/c date is: 2 days              Patient currently is not medically stable to d/c.  Barriers to Discharge:  Antimicrobial agents: Anti-infectives (From admission, onward)   None       Time spent: 25-minutes-Greater than 50% of this time was spent in counseling, explanation of diagnosis, planning of further management, and coordination of care.  MEDICATIONS: Scheduled Meds: .  stroke: mapping our early stages of recovery book   Does not apply Once  . aspirin EC  81 mg Oral Daily  . enoxaparin (LOVENOX) injection  40 mg Subcutaneous Daily  . insulin aspart  0-15 Units Subcutaneous TID AC & HS   Continuous Infusions: . lactated ringers     Followed by  . lactated ringers     PRN Meds:.acetaminophen **OR** acetaminophen, hydrALAZINE, ondansetron **OR** ondansetron (ZOFRAN) IV, polyethylene glycol   PHYSICAL EXAM: Vital signs: Vitals:   11/07/19 0304 11/07/19 0445 11/07/19 0530  BP: (!) 216/79 (!) 162/66 (!) 175/68  Pulse: (!) 58 (!) 57 (!) 57  Resp: 14 14 13   Temp: 98 F  (36.7 C)    TempSrc: Oral    SpO2: 97% 94% 97%   There were no vitals filed for this visit. There is no height or weight on file to calculate BMI.   Gen Exam:Alert awake-not in any distress HEENT:atraumatic, normocephalic Chest: B/L clear to auscultation anteriorly CVS:S1S2 regular Abdomen:soft non tender, non distended Extremities:no edema Neurology: Very mild/subtle right leg weakness Skin: no rash  I have personally reviewed following labs and imaging studies  LABORATORY DATA: CBC: Recent Labs  Lab 11/06/19 0204 11/07/19 0339  WBC 8.9 11.1*  NEUTROABS 6.6 8.6*  HGB 12.4 12.2  HCT 36.7 36.3  MCV 92.7 93.1  PLT 295 299    Basic Metabolic Panel: Recent Labs  Lab 11/06/19 0204 11/07/19 0339  NA 135 132*  K 4.1 3.9  CL 98 98  CO2 27 27  GLUCOSE 345* 260*  BUN 26* 15  CREATININE 0.86 0.82  CALCIUM 8.7* 8.9    GFR: Estimated Creatinine Clearance: 57.6 mL/min (by C-G formula based on SCr of 0.82 mg/dL).  Liver Function Tests: Recent Labs  Lab 11/06/19 0204 11/07/19 0339  AST 14* 17  ALT 13 14  ALKPHOS 81 78  BILITOT 0.7 0.5  PROT 6.0* 5.8*  ALBUMIN 3.4* 3.3*   Recent Labs  Lab 11/06/19 0204  LIPASE 30   No results for input(s): AMMONIA in the last 168 hours.  Coagulation Profile: No results for input(s): INR, PROTIME in the last 168 hours.  Cardiac Enzymes: No results for input(s): CKTOTAL, CKMB, CKMBINDEX, TROPONINI in the last 168 hours.  BNP (last 3 results) No results for input(s): PROBNP in the last 8760 hours.  Lipid Profile: No results for input(s): CHOL, HDL, LDLCALC, TRIG, CHOLHDL, LDLDIRECT in the last 72 hours.  Thyroid Function Tests: No results for input(s): TSH, T4TOTAL, FREET4, T3FREE, THYROIDAB in the last 72 hours.  Anemia Panel: No results for input(s): VITAMINB12, FOLATE, FERRITIN, TIBC, IRON, RETICCTPCT in the last 72 hours.  Urine analysis:    Component Value Date/Time   COLORURINE YELLOW 11/06/2019 0356    APPEARANCEUR HAZY (A) 11/06/2019 0356   LABSPEC 1.012 11/06/2019 0356   PHURINE 7.0 11/06/2019 0356   GLUCOSEU >=500 (A) 11/06/2019 0356   HGBUR SMALL (A) 11/06/2019 0356   BILIRUBINUR NEGATIVE 11/06/2019 0356   KETONESUR NEGATIVE 11/06/2019 0356   PROTEINUR 100 (A) 11/06/2019 0356   UROBILINOGEN 0.2 09/17/2008 1929   NITRITE NEGATIVE 11/06/2019 0356   LEUKOCYTESUR LARGE (A) 11/06/2019 0356    Sepsis Labs: Lactic Acid, Venous No results found for: LATICACIDVEN  MICROBIOLOGY: Recent Results (from the past 240 hour(s))  Respiratory Panel by RT PCR (Flu A&B, Covid) - Nasopharyngeal Swab     Status: None   Collection Time: 11/07/19  3:43 AM   Specimen: Nasopharyngeal Swab  Result Value Ref Range Status   SARS Coronavirus 2 by RT PCR NEGATIVE NEGATIVE Final    Comment: (NOTE) SARS-CoV-2 target nucleic acids are NOT DETECTED.  The SARS-CoV-2 RNA is generally detectable in upper respiratoy specimens during the  acute phase of infection. The lowest concentration of SARS-CoV-2 viral copies this assay can detect is 131 copies/mL. A negative result does not preclude SARS-Cov-2 infection and should not be used as the sole basis for treatment or other patient management decisions. A negative result may occur with  improper specimen collection/handling, submission of specimen other than nasopharyngeal swab, presence of viral mutation(s) within the areas targeted by this assay, and inadequate number of viral copies (<131 copies/mL). A negative result must be combined with clinical observations, patient history, and epidemiological information. The expected result is Negative.  Fact Sheet for Patients:  https://www.moore.com/  Fact Sheet for Healthcare Providers:  https://www.young.biz/  This test is no t yet approved or cleared by the [place] FDA and  has been authorized for detection and/or diagnosis of SARS-CoV-2 by FDA under an Emergency  Use Authorization (EUA). This EUA will remain  in effect (meaning this test can be used) for the duration of the COVID-19 declaration under Section 564(b)(1) of the Act, 21 U.S.C. section 360bbb-3(b)(1), unless the authorization is terminated or revoked sooner.     Influenza A by PCR NEGATIVE NEGATIVE Final   Influenza B by PCR NEGATIVE NEGATIVE Final    Comment: (NOTE) The Xpert Xpress SARS-CoV-2/FLU/RSV assay is intended as an aid in  the diagnosis of influenza from Nasopharyngeal swab specimens and  should not be used as a sole basis for treatment. Nasal washings and  aspirates are unacceptable for Xpert Xpress SARS-CoV-2/FLU/RSV  testing.  Fact Sheet for Patients: https://www.moore.com/  Fact Sheet for Healthcare Providers: https://www.young.biz/  This test is not yet approved or cleared by the [place] FDA and  has been authorized for detection and/or diagnosis of SARS-CoV-2 by  FDA under an Emergency Use Authorization (EUA). This EUA will remain  in effect (meaning this test can be used) for the duration of the  Covid-19 declaration under Section 564(b)(1) of the Act, 21  U.S.C. section 360bbb-3(b)(1), unless the authorization is  terminated or revoked. Performed at [facility], [address] [place], [place] [address]     RADIOLOGY STUDIES/RESULTS: CT ANGIO HEAD W OR WO CONTRAST  Addendum Date: 11/07/2019   ADDENDUM REPORT: 11/07/2019 09:10 ADDENDUM: These results were called by telephone at the time of interpretation on 11/07/2019 at 9:10 am to provider Tamara Monteith, who verbally acknowledged these results. Electronically Signed   By: Marlan Palau M.D.   On: 11/07/2019 09:10   Result Date: 11/07/2019 CLINICAL DATA:  Dizziness.  Intractable vomiting.  Hypertension. EXAM: CT ANGIOGRAPHY HEAD AND NECK TECHNIQUE: Multidetector CT imaging of the head and neck was performed using the standard protocol during bolus  administration of intravenous contrast. Multiplanar CT image reconstructions and MIPs were obtained to evaluate the vascular anatomy. Carotid stenosis measurements (when applicable) are obtained utilizing NASCET criteria, using the distal internal carotid diameter as the denominator. CONTRAST:  43mL OMNIPAQUE IOHEXOL 350 MG/ML SOLN COMPARISON:  CT head 10/30/2019 FINDINGS: CT HEAD FINDINGS Brain: Ill-defined hypodensity in the right medial temporal lobe lateral to the temporal horn has progressed since the CT of 10/30/2019. This is most consistent with subacute infarct. Mild atrophy. Negative for hydrocephalus. Patchy will chronic microvascular ischemic change in the white matter. Chronic lacunar infarction right caudate unchanged. Negative for intracranial hemorrhage or mass. Vascular: Negative for hyperdense vessel Skull: Negative Sinuses: Paranasal sinuses clear. Orbits: Negative Review of the MIP images confirms the above findings CTA NECK FINDINGS Aortic arch: Advanced atherosclerotic disease in the aortic arch and proximal great  vessels. 50% diameter stenosis proximal left subclavian artery. Right carotid system: Atherosclerotic disease throughout the right common carotid artery extending to the carotid bifurcation and internal carotid artery. No significant stenosis. Left carotid system: Atherosclerotic disease left carotid bifurcation. Noncalcified focal stenosis proximal left internal carotid artery estimated 80% diameter stenosis. Vertebral arteries: Right vertebral artery is patent to the basilar without significant stenosis. Left vertebral artery is occluded at the origin with reconstitution at the skull base. Skeleton: No acute skeletal abnormality. Other neck: Negative Upper chest: Lung apices clear bilaterally. Review of the MIP images confirms the above findings CTA HEAD FINDINGS Anterior circulation: Atherosclerotic disease throughout the cavernous carotid bilaterally with mild to moderate stenosis.  Anterior and middle cerebral arteries are patent without large vessel occlusion. There is diffuse irregularity and atherosclerotic disease in the anterior and middle cerebral arteries without focal flow limiting stenosis. Posterior circulation: Right vertebral artery supplies the basilar with atherosclerotic irregularity distally but no significant stenosis. There is opacification distal left vertebral artery which is diffusely diseased. This is likely retrograde flow. PICA patent bilaterally. Diffuse atherosclerotic irregularity in the basilar with mild stenosis in the midportion. Superior cerebellar arteries are patent bilaterally. Both posterior cerebral artery patent with diffuse atherosclerotic irregularity and moderate stenosis. Venous sinuses: Normal venous enhancement Anatomic variants: None Review of the MIP images confirms the above findings IMPRESSION: 1. Ill-defined hypodensity right medial temporal lobe has progressed since the recent CT of 10/30/2019 and most consistent with subacute infarct. Recommend MRI head for further evaluation. 2. Occlusion left vertebral artery at the origin. Chronicity of this occlusion is uncertain. Right vertebral artery widely patent to the basilar. 3. 80% focal noncalcified stenosis proximal left internal carotid artery. 4. Atherosclerotic disease throughout the right carotid artery without significant stenosis. 5. Diffuse intracranial atherosclerotic disease most severe in the posterior cerebral arteries bilaterally. Electronically Signed: By: Marlan Palau M.D. On: 11/07/2019 08:27   CT ANGIO NECK W OR WO CONTRAST  Addendum Date: 11/07/2019   ADDENDUM REPORT: 11/07/2019 09:10 ADDENDUM: These results were called by telephone at the time of interpretation on 11/07/2019 at 9:10 am to provider Kyrianna Barletta, who verbally acknowledged these results. Electronically Signed   By: Marlan Palau M.D.   On: 11/07/2019 09:10   Result Date: 11/07/2019 CLINICAL DATA:  Dizziness.   Intractable vomiting.  Hypertension. EXAM: CT ANGIOGRAPHY HEAD AND NECK TECHNIQUE: Multidetector CT imaging of the head and neck was performed using the standard protocol during bolus administration of intravenous contrast. Multiplanar CT image reconstructions and MIPs were obtained to evaluate the vascular anatomy. Carotid stenosis measurements (when applicable) are obtained utilizing NASCET criteria, using the distal internal carotid diameter as the denominator. CONTRAST:  75mL OMNIPAQUE IOHEXOL 350 MG/ML SOLN COMPARISON:  CT head 10/30/2019 FINDINGS: CT HEAD FINDINGS Brain: Ill-defined hypodensity in the right medial temporal lobe lateral to the temporal horn has progressed since the CT of 10/30/2019. This is most consistent with subacute infarct. Mild atrophy. Negative for hydrocephalus. Patchy will chronic microvascular ischemic change in the white matter. Chronic lacunar infarction right caudate unchanged. Negative for intracranial hemorrhage or mass. Vascular: Negative for hyperdense vessel Skull: Negative Sinuses: Paranasal sinuses clear. Orbits: Negative Review of the MIP images confirms the above findings CTA NECK FINDINGS Aortic arch: Advanced atherosclerotic disease in the aortic arch and proximal great vessels. 50% diameter stenosis proximal left subclavian artery. Right carotid system: Atherosclerotic disease throughout the right common carotid artery extending to the carotid bifurcation and internal carotid artery. No significant stenosis. Left carotid system:  Atherosclerotic disease left carotid bifurcation. Noncalcified focal stenosis proximal left internal carotid artery estimated 80% diameter stenosis. Vertebral arteries: Right vertebral artery is patent to the basilar without significant stenosis. Left vertebral artery is occluded at the origin with reconstitution at the skull base. Skeleton: No acute skeletal abnormality. Other neck: Negative Upper chest: Lung apices clear bilaterally. Review of  the MIP images confirms the above findings CTA HEAD FINDINGS Anterior circulation: Atherosclerotic disease throughout the cavernous carotid bilaterally with mild to moderate stenosis. Anterior and middle cerebral arteries are patent without large vessel occlusion. There is diffuse irregularity and atherosclerotic disease in the anterior and middle cerebral arteries without focal flow limiting stenosis. Posterior circulation: Right vertebral artery supplies the basilar with atherosclerotic irregularity distally but no significant stenosis. There is opacification distal left vertebral artery which is diffusely diseased. This is likely retrograde flow. PICA patent bilaterally. Diffuse atherosclerotic irregularity in the basilar with mild stenosis in the midportion. Superior cerebellar arteries are patent bilaterally. Both posterior cerebral artery patent with diffuse atherosclerotic irregularity and moderate stenosis. Venous sinuses: Normal venous enhancement Anatomic variants: None Review of the MIP images confirms the above findings IMPRESSION: 1. Ill-defined hypodensity right medial temporal lobe has progressed since the recent CT of 10/30/2019 and most consistent with subacute infarct. Recommend MRI head for further evaluation. 2. Occlusion left vertebral artery at the origin. Chronicity of this occlusion is uncertain. Right vertebral artery widely patent to the basilar. 3. 80% focal noncalcified stenosis proximal left internal carotid artery. 4. Atherosclerotic disease throughout the right carotid artery without significant stenosis. 5. Diffuse intracranial atherosclerotic disease most severe in the posterior cerebral arteries bilaterally. Electronically Signed: By: Marlan Palau M.D. On: 11/07/2019 08:27   MR BRAIN WO CONTRAST  Addendum Date: 11/07/2019   ADDENDUM REPORT: 11/07/2019 09:10 ADDENDUM: These results were called by telephone at the time of interpretation on 11/07/2019 at 9:09 am to provider  Jakhari Space, who verbally acknowledged these results. Electronically Signed   By: Marlan Palau M.D.   On: 11/07/2019 09:10   Result Date: 11/07/2019 CLINICAL DATA:  Acute neuro deficit. Stroke. Hypertension and diabetes. EXAM: MRI HEAD WITHOUT CONTRAST TECHNIQUE: Multiplanar, multiecho pulse sequences of the brain and surrounding structures were obtained without intravenous contrast. COMPARISON:  CT angio head and neck 11/07/2019 FINDINGS: Brain: Restricted diffusion in the right hippocampus compatible with acute/subacute infarct. This corresponds to the CT hypodensity. No associated hemorrhage. No other areas of restricted diffusion. Patchy white matter chronic ischemic changes are noted. There is facilitated diffusion in the left parietal infarct which may be late subacute or chronic. Ventricle size normal. Cerebral volume normal for age. Negative for mass lesion. Chronic microhemorrhage right cerebellum. Mild chronic hemorrhage in the left parietal infarct. Vascular: Normal arterial flow voids. Skull and upper cervical spine: Negative Sinuses/Orbits: Paranasal sinuses clear.  Negative orbit. Other: None IMPRESSION: Acute or subacute infarct in the right hippocampus without hemorrhage Mild chronic ischemic changes. Areas of chronic microhemorrhage including in the left parietal infarct which is small. Electronically Signed: By: Marlan Palau M.D. On: 11/07/2019 08:52   DG Abd 2 Views  Result Date: 11/07/2019 CLINICAL DATA:  3 day history of nausea and vomiting. EXAM: ABDOMEN - 2 VIEW COMPARISON:  No comparison studies available. FINDINGS: Gas-filled central small bowel loops are nondilated. Air in stool are seen scattered along the length of a nondilated colon. Atelectasis or scarring noted at the right base. Telemetry leads overlie the abdomen. IMPRESSION: 1. Nonobstructive bowel gas pattern. 2. Atelectasis or scarring at  the right lung base. Electronically Signed   By: Kennith CenterEric  Mansell M.D.   On: 11/07/2019  07:35     LOS: 0 days   Jeoffrey MassedShanker Kayveon Lennartz, MD  [facility]    To contact the attending provider between 7A-7P or the covering provider during after hours 7P-7A, please log into the web site [email or web address] and access using universal [place] password for that web site. If you do not have the password, please call the hospital operator.  11/07/2019, 9:47 AM

## 2019-11-07 NOTE — H&P (Signed)
History and Physical    Chelsea [name] SWN:[id number] DOB: Oct 09, 1945 DOA: 11/07/2019  PCP: Mattie Marlin, DO  Patient coming from: Home   Chief Complaint:  Chief Complaint  Patient presents with  . Hypertension  . Hyperglycemia     HPI:    74 year old female with past medical history of recently diagnosed hypertension, diabetes mellitus type 2, medical noncompliance, nicotine dependence who presents to [facility] emergency department with intractable nausea and vomiting.  Patient is somewhat of a poor historian.  History has been obtained from multiple sources including emergency department notes, outpatient clinic notes, patient as well as discussion with the patient's daughter.  In April of 2021 patient had suffered a fall secondary to right lower extremity weakness.  Instead of seeking the attention of a medical provider patient saw an outpatient orthopedist at that time had performed multiple x-rays revealing no evidence of bony injury.  Patient was additionally referred to physical therapy in the weeks that followed with some improvement in her strength.  According to the daughter, patient's right lower extremity weakness persisted.  Patient additionally would exhibit bouts of transient slurred speech, blurry vision of the left eye and other episodes of transient weakness in the following months.  On October 19, the patient was again suffered a fall.  Patient states that her legs "gave out" however the daughter places down on this claim.  Patient presented to [facility] emergency department at that time received imaging the head revealed no acute disease.  During that emergency department evaluation, patient was noted to have substantial uncontrolled blood pressure and hyperglycemia consistent with new diagnoses of hypertension and diabetes.  Patient was discharged home with a new regimen of Metformin and hydrochlorothiazide.  Patient proceeded to not take medications  prescribed to her over the next several days.  Patient did follow-up with a new primary care provider on 10/21 to establish care.  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  During that visit a carotid ultrasound was obtained which revealed left critical internal carotid stenosis of 80%.  The patient was referred to vascular surgery at that time.  Patient followed up with Dr. Arbie Cookey with vascular surgery on 10/25 who recommended surgical intervention via carotid endarterectomy but scheduled it towards the end of November to give time to address the uncontrolled blood pressure the patient was suffering from.  In the past 2 days patient has been suffering from frequent bouts of nausea and vomiting with associated weakness.  Patient initially presented to [facility] emergency department on 10/25.  Patient was managed with intravenous fluids and antiemetics.  It was felt the patient's symptoms may be secondary to gabapentin intolerance.  Patient was instructed to stop taking this medication and was discharged home.  In the 24 hours that followed, patient continued to experience nausea and frequent bouts of nonbilious nonbloody vomiting.  Patient denies any associated abdominal pain.  Patient does complain of intermittent bouts of lightheadedness during and after her episodes of vomiting.  She is also complaining that her right lower extremity weakness seems to be worsening and continues to complain of transient left eye blurry vision.  The daughter continues to state that the patient is also exhibiting bouts of slurred speech.  Patient presents again to [facility] emergency department for evaluation.  On this presentation due to  patient's intractable nausea and vomiting, hypertensive urgency and neurologic changes the hospitalist group has been called to assess the patient for admission the hospital.  Patient additionally Review of Systems:   Review of Systems  Gastrointestinal: Positive for nausea and vomiting.  Musculoskeletal: Positive for back pain and falls.  Neurological: Positive for dizziness, sensory change and weakness.  All other systems reviewed and are negative.   Past Medical History:  Diagnosis Date  . Blind left eye   . Cardiac arrest (HCC)   . Diabetes mellitus without complication (HCC)   . Hypertension   . Stroke [facility])     History reviewed. No pertinent surgical history.   reports that she has been smoking. She has never used smokeless tobacco. She reports previous alcohol use. She reports that she does not use drugs.  Allergies  Allergen Reactions  . Codeine Anaphylaxis and Swelling    Throat swells    . Gabapentin Anaphylaxis  . Penicillins Anaphylaxis and Swelling    Throat closes    . Sulfa Antibiotics Anaphylaxis, Swelling and Other (See Comments)    Lips and eye swell   . Benzodiazepines Hives  . Lidocaine Hives    Family History  Problem Relation Age of Onset  . Heart attack Mother   . Stroke Father      Prior to Admission medications   Medication Sig Start Date End Date Taking? Authorizing Provider  ascorbic acid (VITAMIN C) 500 MG tablet Take 500 mg by mouth daily.   Yes [provider]  aspirin EC 81 MG tablet Take 81 mg by mouth daily. Swallow whole.   Yes [provider]  Calcium-Magnesium-Zinc (CAL-MAG-ZINC PO) Take 1 tablet by mouth daily.   Yes [provider]  Multiple Vitamin (MULTI-VITAMIN DAILY PO) Take by mouth.   Yes [provider]  hydrochlorothiazide (HYDRODIURIL) 25 MG tablet Take 1 tablet (25 mg total) by mouth daily. 10/30/19   Sabas Sous, MD  metFORMIN (GLUCOPHAGE) 500 MG tablet Take 1 tablet (500 mg  total) by mouth 2 (two) times daily with a meal. Patient not taking: Reported on 11/05/2019 10/30/19 12/29/19  Sabas Sous, MD    Physical Exam: Vitals:   11/07/19 0304 11/07/19 0445 11/07/19 0530  BP: (!) 216/79 (!) 162/66 (!) 175/68  Pulse: (!) 58 (!) 57 (!) 57  Resp: Temp: 98 F (36.7 C)    TempSrc: Oral    SpO2: 97% 94% 97%    Constitutional: Acute alert and oriented x3, no associated distress.   Skin: no rashes, no lesions, extremely poor skin turgor noted. Eyes: Pupils are equally reactive to light.  No evidence of scleral icterus or conjunctival pallor.  ENMT: Dry mucous membranes noted.  Posterior pharynx clear of any exudate or lesions.   Neck: normal, supple, no masses, no thyromegaly.  No evidence of jugular venous distension.  Left carotid bruit noted. Respiratory: clear to auscultation bilaterally, no wheezing, no crackles. Normal respiratory effort. No accessory muscle use.  Cardiovascular: Regular rate and rhythm, no murmurs / rubs / gallops. No extremity edema. 2+ pedal pulses. No carotid bruits.  Chest:   Nontender without crepitus or deformity.   Back:   Nontender without crepitus or deformity. Abdomen: Abdomen is soft and nontender.  No evidence of intra-abdominal masses.  Positive bowel sounds noted in all quadrants.   Musculoskeletal: No joint deformity upper and lower extremities. Good ROM,  no contractures. Normal muscle tone.  Neurologic: Abnormal finger-to-nose test of the right arm.  Patient exhibits mild weakness of the right lower extremity in both proximal distal muscle groups.  Extraocular movements intact.  CN 2-12 grossly intact. Sensation intact.  Patient is following all commands.  Patient is responsive to verbal stimuli.   Psychiatric: Patient exhibits normal mood with flat affect.  Patient seems to possess insight as to their current situation.     Labs on Admission: I have personally reviewed following labs and imaging studies -    CBC: Recent Labs  Lab 11/06/19 0204 11/07/19 0339  WBC 8.9 11.1*  NEUTROABS 6.6 8.6*  HGB 12.4 12.2  HCT 36.7 36.3  MCV 92.7 93.1  PLT 295 299   Basic Metabolic Panel: Recent Labs  Lab 11/06/19 0204 11/07/19 0339  NA 135 132*  K 4.1 3.9  CL 98 98  CO2 27 27  GLUCOSE 345* 260*  BUN 26* 15  CREATININE 0.86 0.82  CALCIUM 8.7* 8.9   GFR: Estimated Creatinine Clearance: 57.6 mL/min (by C-G formula based on SCr of 0.82 mg/dL). Liver Function Tests: Recent Labs  Lab 11/06/19 0204 11/07/19 0339  AST 14* 17  ALT 13 14  ALKPHOS 81 78  BILITOT 0.7 0.5  PROT 6.0* 5.8*  ALBUMIN 3.4* 3.3*   Recent Labs  Lab 11/06/19 0204  LIPASE 30   No results for input(s): AMMONIA in the last 168 hours. Coagulation Profile: No results for input(s): INR, PROTIME in the last 168 hours. Cardiac Enzymes: No results for input(s): CKTOTAL, CKMB, CKMBINDEX, TROPONINI in the last 168 hours. BNP (last 3 results) No results for input(s): PROBNP in the last 8760 hours. HbA1C: No results for input(s): HGBA1C in the last 72 hours. CBG: Recent Labs  Lab 11/07/19 0303  GLUCAP 240*   Lipid Profile: No results for input(s): CHOL, HDL, LDLCALC, TRIG, CHOLHDL, LDLDIRECT in the last 72 hours. Thyroid Function Tests: No results for input(s): TSH, T4TOTAL, FREET4, T3FREE, THYROIDAB in the last 72 hours. Anemia Panel: No results for input(s): VITAMINB12, FOLATE, FERRITIN, TIBC, IRON, RETICCTPCT in the last 72 hours. Urine analysis:    Component Value Date/Time   COLORURINE YELLOW 11/06/2019 0356   APPEARANCEUR HAZY (A) 11/06/2019 0356   LABSPEC 1.012 11/06/2019 0356   PHURINE 7.0 11/06/2019 0356   GLUCOSEU >=500 (A) 11/06/2019 0356   HGBUR SMALL (A) 11/06/2019 0356   BILIRUBINUR NEGATIVE 11/06/2019 0356   KETONESUR NEGATIVE 11/06/2019 0356   PROTEINUR 100 (A) 11/06/2019 0356   UROBILINOGEN 0.2 09/17/2008 1929   NITRITE NEGATIVE 11/06/2019 0356   LEUKOCYTESUR LARGE (A) 11/06/2019 0356     Radiological Exams on Admission - Personally Reviewed: No results found.  EKG: Personally reviewed.  Rhythm is sinus bradycardia with heart rate of 58 bpm.  T wave inversions noted in the lateral and inferior leads.  Evidence of LVH.   Assessment/Plan Principal Problem:   Intractable nausea and vomiting   Patient presenting with nearly 48 hours of persisting nausea and vomiting.  Etiology is not completely clear at this time.  Possibly secondary to medication side effect, possibly secondary to urinary tract infection, possibly secondary to intracranial hemorrhage, possibly secondary to ileus or bowel obstruction.  Obtaining noncontrast CT of the head considering markedly elevated blood pressures.  Obtain urinalysis to rule out urinary tract infection.  Obtaining abdominal x-ray to rule out obstruction.  Holding home regimen of gabapentin.  Hydrating patient gently with intravenous fluids  Providing patient with intravenous antiemetics  Clear liquid diet for now, advance as tolerated.  Adjust treatment plan based on results of above diagnostics.  Active Problems:  Right lower extremity weakness   Patient is a poor historian however upon review of notes of previous presentations we will discussion with the family it seems patient has been exhibiting right lower extremity weakness, intermittent blurry vision and intermittent slurred speech since at least April.  Considering presence of critical left carotid stenosis, I am concerned the patient may be suffering from 1 or multiple strokes.  Case discussed with Dr. Thomasena Edisollins who agrees that this is quite possible and recommends full work-up for possible stroke.  Obtaining CT head, MRI brain, CT angiogram of the head and neck, echocardiogram  Monitoring patient on telemetry  Obtaining physical therapy and Occupational Therapy evaluations  Obtaining hemoglobin A1c and lipid panel  Aspirin has been initiated  Permissive  hypertension for now as mentioned below.  Neurology agreed to consult on the patient, the recommendations are appreciated.    Hypertensive urgency   Patient presented with hypertensive urgency and blood pressures as high as 216/79  Case discussed with Dr. Thomasena Edisollins with neurology.  Considering concerns for transient slurred speech, right lower extremity weakness, intermittent blurry vision and multiple falls will allow for permissive hypertension for now until a stroke is ruled out.  We will only provide patient with.  Intravenous antihypertensives are markedly related blood pressures.  Holding all recently prescribed oral antihypertensives which seems the patient has yet to take anyway.  If stroke is ruled out we will begin the process of titrating antihypertensives to achieve 25% blood pressure reduction in the first 24 hours.    Stenosis of left internal carotid artery   Recent diagnosis of critical internal carotid stenosis of 80% several days ago via carotid ultrasound ordered by patient's primary care provider  Patient is already been evaluated by Dr. Arbie CookeyEarly in vascular clinic with the original intention to proceed with carotid endarterectomy in late November to give time to get blood pressure better controlled.  With patient's waxing and waning neurologic symptoms and frequent presentations for medical care, it is felt the patient must be worked up for the possibility of stroke secondary to this critical stenosis.  Working patient up, detailed above.  If patient indeed does have 1 or multiple acute or subacute strokes then will obtain vascular consultation during this hospitalization for consideration of expediting intervention.    Uncontrolled type 2 diabetes mellitus with hyperglycemia, without long-term current use of insulin (HCC)   Patient has yet to start recently prescribed Metformin  Obtaining hemoglobin A1c  Accu-Cheks before every meal and nightly with sliding scale  insulin  Once diet is advanced will likely place patient on basal bolus insulin therapy while hospitalized.    Nicotine dependence, cigarettes, uncomplicated   Counseling patient daily on cessation    HZV (herpes zoster virus) post herpetic neuralgia    Patient suffered from herpes zoster of the left shoulder in April 2020  Patient suffered from postherpetic neuralgia since  Patient has recently been prescribed gabapentin for management of this but it is possible that patient is intolerant to this medication.  Holding gabapentin for now.  Will consider other treatment options at time of discharge.   Code Status:  Full code Family Communication: Daughter is at bedside who has been updated on plan of care.  Status is: Observation  The patient remains OBS appropriate and will d/c before 2 midnights.  Dispo: The patient is from: Home  Anticipated d/c is to: Home              Anticipated d/c date is: 2 days              Patient currently not medically stable for discharge.        Marinda Elk MD [facility] Pager [phone]  If 7PM-7AM, please contact night-coverage [email or web address] Use universal [place] password for that web site. If you do not have the password, please call the hospital operator.  11/07/2019, 7:36 AM

## 2019-11-07 NOTE — Evaluation (Signed)
Occupational Therapy Evaluation Patient Details Name: MAVEN ROSANDER MRN: [id number] DOB: 13-Sep-1945 Today's Date: 11/07/2019    History of Present Illness 74 y/o female presenting with 1 week history of frequent falls, subtle right lower extremity weakness, L visual deficits, nausea and vomiting. MRI shows R hippocampus infarct. PMH includes HTN (uncontrolled, has not seen an MD in 15 years), DM-2, left carotid artery stenosis   Clinical Impression   PTA pt living with daughter, functioning at independent level up until last ~2 weeks. Since then, pt has had frequent falls, visual changes and needed to use RW for mobility. At time of eval, pt able to complete bed mobility with mod A and sit <> stands with min A for steadying support. Pt with nausea (no emesis) and dizziness with position change (see BP below). Noted L visual field deficits with possible inattention component with visual field testing. Pt with mild deficits in awareness of safety and current condition. Given current status, recommend CIR vs OP neuro pending progress. Will continue to follow per POC listed below.   Supine: 231/83 Sitting: 223/91 Supine: 185/76    Follow Up Recommendations  Other (comment) (CIR pending progress, may progress to OP neuro)    Equipment Recommendations  None recommended by OT    Recommendations for Other Services       Precautions / Restrictions Precautions Precautions: Fall Restrictions Weight Bearing Restrictions: No      Mobility Bed Mobility Overal bed mobility: Needs Assistance Bed Mobility: Supine to Sit     Supine to sit: Mod assist     General bed mobility comments: assist to pull up at trunk into upright sitting.    Transfers Overall transfer level: Needs assistance Equipment used: 1 person hand held assist Transfers: Sit to/from Stand Sit to Stand: Min assist         General transfer comment: assist to rise hips off of stretcher and steady self in  standing    Balance Overall balance assessment: Needs assistance Sitting-balance support: Bilateral upper extremity supported;Feet supported Sitting balance-Leahy Scale: Fair     Standing balance support: Bilateral upper extremity supported;During functional activity Standing balance-Leahy Scale: Poor Standing balance comment: reliant on external support                           ADL either performed or assessed with clinical judgement   ADL Overall ADL's : Needs assistance/impaired Eating/Feeding: Set up;Sitting   Grooming: Set up;Sitting   Upper Body Bathing: Minimal assistance;Sitting   Lower Body Bathing: Minimal assistance;Sit to/from stand;Sitting/lateral leans   Upper Body Dressing : Set up;Sitting   Lower Body Dressing: Minimal assistance;Sit to/from stand;Sitting/lateral leans   Toilet Transfer: Minimal assistance;Stand-pivot;BSC;RW   Toileting- Clothing Manipulation and Hygiene: Minimal assistance;Sit to/from stand       Functional mobility during ADLs: Minimal assistance;Moderate assistance;Rolling walker;Cueing for sequencing;Cueing for safety       Vision Baseline Vision/History: No visual deficits Patient Visual Report: Diplopia;Peripheral vision impairment (reports these symptoms as occaisonal) Vision Assessment?: Yes Tracking/Visual Pursuits: Decreased smoothness of eye movement to LEFT inferior field;Decreased smoothness of eye movement to LEFT superior field;Impaired - to be further tested in functional context;Requires cues, head turns, or add eye shifts to track Visual Fields: Left visual field deficit;Impaired-to be further tested in functional context Diplopia Assessment: Other (comment) (not present on assessment, but reports it has been present with L eye) Additional Comments: Pt reports 1 week history of L eye feeling "cut  off" and occainal diplopia (not present on assessment). With visual field testing, pt has notable L visual field  cut but also has very little awareness of this. Suspect possible inattention component as well?     Perception     Praxis      Pertinent Vitals/Pain Pain Assessment: No/denies pain     Hand Dominance     Extremity/Trunk Assessment Upper Extremity Assessment Upper Extremity Assessment: Generalized weakness   Lower Extremity Assessment Lower Extremity Assessment: Defer to PT evaluation       Communication Communication Communication: No difficulties   Cognition Arousal/Alertness: Awake/alert Behavior During Therapy: WFL for tasks assessed/performed Overall Cognitive Status: Impaired/Different from baseline Area of Impairment: Safety/judgement                         Safety/Judgement: Decreased awareness of safety;Decreased awareness of deficits     General Comments: Decreased awareness of current deficits and how it may impact her safety going home. will benefit from further assessment given nature of CVA   General Comments       Exercises     Shoulder Instructions      Home Living Family/patient expects to be discharged to:: Private residence Living Arrangements: Children Available Help at Discharge: Family;Available 24 hours/day Type of Home: House Home Access: Stairs to enter [employer] of Steps: 4 Entrance Stairs-Rails: Can reach both Home Layout: One level     Bathroom Shower/Tub: Chief Strategy Officer: Handicapped height (had hand rails over toilet)     Home Equipment: Emergency planning/management officer - 2 wheels;Other (comment)   Additional Comments: has a friend installing rails throughout bathroom; has adjustable bed; lift chair      Prior Functioning/Environment Level of Independence: Independent with assistive device(s)        Comments: was independent without AD until last week- then began using RW        OT Problem List: Decreased strength;Impaired vision/perception;Decreased knowledge of use of DME or AE;Decreased  knowledge of precautions;Decreased activity tolerance;Decreased cognition;Impaired balance (sitting and/or standing);Decreased safety awareness      OT Treatment/Interventions: Self-care/ADL training;Therapeutic exercise;Patient/family education;Visual/perceptual remediation/compensation;Balance training;Neuromuscular education;Energy conservation;Therapeutic activities;DME and/or AE instruction    OT Goals(Current goals can be found in the care plan section) Acute Rehab OT Goals Patient Stated Goal: return to independence OT Goal Formulation: With patient Time For Goal Achievement: 11/21/19 Potential to Achieve Goals: Good  OT Frequency: Min 2X/week   Barriers to D/C:            Co-evaluation PT/OT/SLP Co-Evaluation/Treatment: Yes Reason for Co-Treatment: To address functional/ADL transfers   OT goals addressed during session: ADL's and self-care;Strengthening/ROM      AM-PAC OT "6 Clicks" Daily Activity     Outcome Measure Help from another person eating meals?: A Little Help from another person taking care of personal grooming?: A Little Help from another person toileting, which includes using toliet, bedpan, or urinal?: A Lot Help from another person bathing (including washing, rinsing, drying)?: A Lot Help from another person to put on and taking off regular upper body clothing?: A Little Help from another person to put on and taking off regular lower body clothing?: A Lot 6 Click Score: 15   End of Session Equipment Utilized During Treatment: Gait belt Nurse Communication: Mobility status  Activity Tolerance: Patient tolerated treatment well Patient left: in bed;with call bell/phone within reach  OT Visit Diagnosis: Unsteadiness on feet (R26.81);Other abnormalities of gait and mobility (R26.89);Low  vision, both eyes (H54.2);Other symptoms and signs involving cognitive function;Muscle weakness (generalized) (M62.81);History of falling (Z91.81)                Time:  1497-0263 OT Time Calculation (min): 22 min Charges:  OT General Charges $OT Visit: 1 Visit OT Evaluation $OT Eval Moderate Complexity: 1 Mod  Dalphine Handing, MSOT, OTR/L Acute Rehabilitation Services [facility] Office Number: [phone] Pager: [phone]  Dalphine Handing 11/07/2019, 10:57 AM

## 2019-11-07 NOTE — Evaluation (Signed)
Physical Therapy Evaluation Patient Details Name: Chelsea [name] MRN: [id number] DOB: 1945-06-22 Today's Date: 11/07/2019   History of Present Illness  74 y/o female presenting with 1 week history of frequent falls, subtle right lower extremity weakness, L visual deficits, nausea and vomiting. MRI shows R hippocampus infarct. PMH includes HTN (uncontrolled, has not seen an MD in 15 years), DM-2, left carotid artery stenosis  Clinical Impression  Pt admitted secondary to problem above with deficits below. Session limited secondary to dizziness. Pt requiring mod A for bed mobility and min A to stand at EOB. BP elevated as well; RN notified. L visual field deficits noted as well. Given limited mobility this session, will need to progress further prior to determining appropriate d/c recommendations. May need to consider CIR if notable deficits noted during mobility progress. Will continue to follow acutely.   Supine: 231/83 Sitting: 223/91 Supine: 185/76    Follow Up Recommendations Other (comment);Supervision/Assistance - 24 hour (TBD pending mobility progression )    Equipment Recommendations  Other (comment) (TBD)    Recommendations for Other Services       Precautions / Restrictions Precautions Precautions: Fall Restrictions Weight Bearing Restrictions: No      Mobility  Bed Mobility Overal bed mobility: Needs Assistance Bed Mobility: Supine to Sit;Sit to Supine     Supine to sit: Mod assist Sit to supine: Supervision   General bed mobility comments: assist to pull up at trunk into upright sitting.    Transfers Overall transfer level: Needs assistance Equipment used: 1 person hand held assist Transfers: Sit to/from Stand Sit to Stand: Min assist         General transfer comment: assist to rise hips off of stretcher and steady self in standing. Further mobility limited secondary to dizziness   Ambulation/Gait                Stairs             Wheelchair Mobility    Modified Rankin (Stroke Patients Only) Modified Rankin (Stroke Patients Only) Pre-Morbid Rankin Score: No significant disability Modified Rankin: Moderately severe disability     Balance Overall balance assessment: Needs assistance Sitting-balance support: Bilateral upper extremity supported;Feet supported Sitting balance-Leahy Scale: Fair     Standing balance support: Bilateral upper extremity supported;During functional activity Standing balance-Leahy Scale: Poor Standing balance comment: reliant on external support                             Pertinent Vitals/Pain Pain Assessment: No/denies pain    Home Living Family/patient expects to be discharged to:: Private residence Living Arrangements: Children Available Help at Discharge: Family;Available 24 hours/day Type of Home: House Home Access: Stairs to enter Entrance Stairs-Rails: Can reach both Entrance Stairs-Number of Steps: 4 Home Layout: One level Home Equipment: Emergency planning/management officer - 2 wheels;Other (comment) Additional Comments: has a friend installing rails throughout bathroom; has adjustable bed; lift chair    Prior Function Level of Independence: Independent with assistive device(s)         Comments: was independent without AD until last week- then began using RW     Hand Dominance        Extremity/Trunk Assessment   Upper Extremity Assessment Upper Extremity Assessment: Defer to OT evaluation    Lower Extremity Assessment Lower Extremity Assessment: RLE deficits/detail RLE Deficits / Details: hip flexors at 3/5. was not able to hold against resistance. other muscle groups at grossly 4/5  throughout     Cervical / Trunk Assessment Cervical / Trunk Assessment: Normal  Communication   Communication: No difficulties  Cognition Arousal/Alertness: Awake/alert Behavior During Therapy: WFL for tasks assessed/performed Overall Cognitive Status: Impaired/Different  from baseline Area of Impairment: Safety/judgement                         Safety/Judgement: Decreased awareness of safety;Decreased awareness of deficits     General Comments: Decreased awareness of current deficits and how it may impact her safety going home. will benefit from further assessment given nature of CVA      General Comments      Exercises     Assessment/Plan    PT Assessment Patient needs continued PT services  PT Problem List Decreased strength;Decreased balance;Decreased mobility;Decreased knowledge of use of DME;Decreased knowledge of precautions       PT Treatment Interventions Gait training;DME instruction;Functional mobility training;Stair training;Therapeutic activities;Therapeutic exercise;Balance training;Patient/family education    PT Goals (Current goals can be found in the Care Plan section)  Acute Rehab PT Goals Patient Stated Goal: return to independence PT Goal Formulation: With patient Time For Goal Achievement: 11/21/19 Potential to Achieve Goals: Good    Frequency Min 4X/week   Barriers to discharge        Co-evaluation PT/OT/SLP Co-Evaluation/Treatment: Yes Reason for Co-Treatment: To address functional/ADL transfers PT goals addressed during session: Mobility/safety with mobility         AM-PAC PT "6 Clicks" Mobility  Outcome Measure Help needed turning from your back to your side while in a flat bed without using bedrails?: A Little Help needed moving from lying on your back to sitting on the side of a flat bed without using bedrails?: A Lot Help needed moving to and from a bed to a chair (including a wheelchair)?: A Little Help needed standing up from a chair using your arms (e.g., wheelchair or bedside chair)?: A Little Help needed to walk in hospital room?: A Lot Help needed climbing 3-5 steps with a railing? : A Lot 6 Click Score: 15    End of Session Equipment Utilized During Treatment: Gait belt Activity  Tolerance: Treatment limited secondary to medical complications (Comment) (dizziness) Patient left: in bed;with call bell/phone within reach (on stretcher in ED ) Nurse Communication: Mobility status PT Visit Diagnosis: Unsteadiness on feet (R26.81);Muscle weakness (generalized) (M62.81)    Time: 8099-8338 PT Time Calculation (min) (ACUTE ONLY): 27 min   Charges:   PT Evaluation $PT Eval Moderate Complexity: 1 Mod          Farley Ly, PT, DPT  Acute Rehabilitation Services  Pager: [phone] Office: [phone]   Lehman Prom 11/07/2019, 4:28 PM

## 2019-11-07 NOTE — ED Triage Notes (Signed)
EMS arrival from home co HTN and hyperglycemia. Per EMS pt seen 11/05/19 att [facility] for similar complaint. 312 CBG, 229/94 in route. 20 G L AC NS.

## 2019-11-08 LAB — COMPREHENSIVE METABOLIC PANEL
ALT: 14 U/L (ref 0–44)
AST: 17 U/L (ref 15–41)
Albumin: 3.3 g/dL — ABNORMAL LOW (ref 3.5–5.0)
Alkaline Phosphatase: 73 U/L (ref 38–126)
Anion gap: 9 (ref 5–15)
BUN: 14 mg/dL (ref 8–23)
CO2: 25 mmol/L (ref 22–32)
Calcium: 9.2 mg/dL (ref 8.9–10.3)
Chloride: 103 mmol/L (ref 98–111)
Creatinine, Ser: 1.04 mg/dL — ABNORMAL HIGH (ref 0.44–1.00)
GFR, Estimated: 56 mL/min — ABNORMAL LOW (ref >60)
Glucose, Bld: 165 mg/dL — ABNORMAL HIGH (ref 70–99)
Potassium: 3.5 mmol/L (ref 3.5–5.1)
Sodium: 137 mmol/L (ref 135–145)
Total Bilirubin: 0.8 mg/dL (ref 0.3–1.2)
Total Protein: 5.8 g/dL — ABNORMAL LOW (ref 6.5–8.1)

## 2019-11-08 LAB — CBC WITH DIFFERENTIAL/PLATELET
Abs Immature Granulocytes: 0.03 10*3/uL (ref 0.00–0.07)
Basophils Absolute: 0.1 10*3/uL (ref 0.0–0.1)
Basophils Relative: 1 %
Eosinophils Absolute: 0.1 10*3/uL (ref 0.0–0.5)
Eosinophils Relative: 0 %
HCT: 38.6 % (ref 36.0–46.0)
Hemoglobin: 13 g/dL (ref 12.0–15.0)
Immature Granulocytes: 0 %
Lymphocytes Relative: 23 %
Lymphs Abs: 3 10*3/uL (ref 0.7–4.0)
MCH: 31.2 pg (ref 26.0–34.0)
MCHC: 33.7 g/dL (ref 30.0–36.0)
MCV: 92.6 fL (ref 80.0–100.0)
Monocytes Absolute: 0.8 10*3/uL (ref 0.1–1.0)
Monocytes Relative: 6 %
Neutro Abs: 8.9 10*3/uL — ABNORMAL HIGH (ref 1.7–7.7)
Neutrophils Relative %: 70 %
Platelets: 311 10*3/uL (ref 150–400)
RBC: 4.17 MIL/uL (ref 3.87–5.11)
RDW: 12.3 % (ref 11.5–15.5)
WBC: 12.7 10*3/uL — ABNORMAL HIGH (ref 4.0–10.5)
nRBC: 0 % (ref 0.0–0.2)

## 2019-11-08 LAB — LIPID PANEL
Cholesterol: 173 mg/dL (ref 0–200)
HDL: 36 mg/dL — ABNORMAL LOW (ref >40)
LDL Cholesterol: 121 mg/dL — ABNORMAL HIGH (ref 0–99)
Total CHOL/HDL Ratio: 4.8 RATIO
Triglycerides: 82 mg/dL (ref <150)
VLDL: 16 mg/dL (ref 0–40)

## 2019-11-08 LAB — GLUCOSE, CAPILLARY
Glucose-Capillary: 124 mg/dL — ABNORMAL HIGH (ref 70–99)
Glucose-Capillary: 158 mg/dL — ABNORMAL HIGH (ref 70–99)
Glucose-Capillary: 157 mg/dL — ABNORMAL HIGH (ref 70–99)
Glucose-Capillary: 132 mg/dL — ABNORMAL HIGH (ref 70–99)

## 2019-11-08 LAB — HEMOGLOBIN A1C
Hgb A1c MFr Bld: 10.1 % — ABNORMAL HIGH (ref 4.8–5.6)
Mean Plasma Glucose: 243 mg/dL

## 2019-11-08 LAB — URINE CULTURE

## 2019-11-08 LAB — APTT: aPTT: 32 seconds (ref 24–36)

## 2019-11-08 LAB — PROTIME-INR
INR: 1 (ref 0.8–1.2)
Prothrombin Time: 13.1 seconds (ref 11.4–15.2)

## 2019-11-08 MED ORDER — ASPIRIN EC 325 MG PO TBEC
325.0000 mg | DELAYED_RELEASE_TABLET | Freq: Every day | ORAL | Status: DC
  Administered 2019-11-08: 325 mg via ORAL
  Filled 2019-11-08: qty 1

## 2019-11-08 MED ORDER — LACTATED RINGERS IV BOLUS
500.0000 mL | Freq: Once | INTRAVENOUS | Status: AC
  Administered 2019-11-08: 500 mL via INTRAVENOUS

## 2019-11-08 MED ORDER — LACTATED RINGERS IV SOLN: INTRAVENOUS | Status: DC

## 2019-11-08 MED ORDER — ASPIRIN EC 81 MG PO TBEC
81.0000 mg | DELAYED_RELEASE_TABLET | Freq: Every day | ORAL | Status: DC
  Administered 2019-11-09: 81 mg via ORAL
  Filled 2019-11-08: qty 1

## 2019-11-08 MED ORDER — PREGABALIN 25 MG PO CAPS
25.0000 mg | ORAL_CAPSULE | Freq: Every evening | ORAL | Status: DC
  Administered 2019-11-08 – 2019-11-09 (×2): 25 mg via ORAL
  Filled 2019-11-08 (×2): qty 1

## 2019-11-08 MED ORDER — CARVEDILOL 6.25 MG PO TABS
6.2500 mg | ORAL_TABLET | Freq: Two times a day (BID) | ORAL | Status: DC
  Administered 2019-11-08 (×2): 6.25 mg via ORAL
  Filled 2019-11-08 (×2): qty 1

## 2019-11-08 MED ORDER — CLOPIDOGREL BISULFATE 75 MG PO TABS
75.0000 mg | ORAL_TABLET | Freq: Every day | ORAL | Status: DC
  Administered 2019-11-08 – 2019-11-09 (×2): 75 mg via ORAL
  Filled 2019-11-08 (×2): qty 1

## 2019-11-08 MED ORDER — DIPHENHYDRAMINE HCL 50 MG/ML IJ SOLN
25.0000 mg | Freq: Once | INTRAMUSCULAR | Status: AC
  Administered 2019-11-08: 25 mg via INTRAVENOUS

## 2019-11-08 NOTE — Progress Notes (Signed)
Occupational Therapy Treatment Patient Details Name: Chelsea [name] MRN: [id number] DOB: [birth year] Today's Date: 11/08/2019    History of present illness 74 y/o female presenting with 1 week history of frequent falls, subtle right lower extremity weakness, L visual deficits, nausea and vomiting. MRI shows R hippocampus infarct. PMH includes HTN (uncontrolled, has not seen an MD in 15 years), DM-2, left carotid artery stenosis   OT comments  Pt progressing towards acute OT goals. Session limited by lethargy/fatigue, headache, and onset of n/v sitting EOB. Pt sat EOB several minutes at close min guard level. L visual field cut and possible L side neglect noted. Right downward gaze preference sitting EOB. Pt with good family support and independent at baseline. Feel once her n/v is more under control her activity tolerance will improve. Updated d/c recommendation to CIR.    Follow Up Recommendations  CIR    Equipment Recommendations  None recommended by OT    Recommendations for Other Services      Precautions / Restrictions Precautions Precautions: Fall Restrictions Weight Bearing Restrictions: No       Mobility Bed Mobility Overal bed mobility: Needs Assistance Bed Mobility: Sit to Supine;Rolling;Sidelying to Sit Rolling: Mod assist Sidelying to sit: Mod assist   Sit to supine: Mod assist   General bed mobility comments: assist to advance BLE off EOB and powerup trunk. Assist to control descent and advance BLE back onto bed at end of session  Transfers                 General transfer comment: unable to attempt 2/2 n/v and fatigue/lethargy    Balance Overall balance assessment: Needs assistance Sitting-balance support: Bilateral upper extremity supported;Feet supported Sitting balance-Leahy Scale: Fair Sitting balance - Comments: close min guard to sit EOB several minutes                                   ADL either performed or assessed with  clinical judgement   ADL Overall ADL's : Needs assistance/impaired                                       General ADL Comments: Bed mobility and sat EOB several minutes close min guard level-light steadying A. Onset of n/v sitting EOB.      Vision   Additional Comments: L visual field cut and possible L side neglext as well. Right downward gaze preference sitting EOB   Perception     Praxis      Cognition Arousal/Alertness: Lethargic Behavior During Therapy: Anxious;WFL for tasks assessed/performed Overall Cognitive Status: Impaired/Different from baseline Area of Impairment: Safety/judgement                         Safety/Judgement: Decreased awareness of safety;Decreased awareness of deficits     General Comments: more alert sitting EOB        Exercises     Shoulder Instructions       General Comments Daughter present throughout session.    Pertinent Vitals/ Pain       Pain Assessment: Faces Faces Pain Scale: Hurts little more Pain Location: headache Pain Descriptors / Indicators: Aching Pain Intervention(s): Monitored during session  Home Living  Prior Functioning/Environment              Frequency  Min 2X/week        Progress Toward Goals  OT Goals(current goals can now be found in the care plan section)  Progress towards OT goals: Progressing toward goals  Acute Rehab OT Goals Patient Stated Goal: return to independence OT Goal Formulation: With patient Time For Goal Achievement: 11/21/19 Potential to Achieve Goals: Good ADL Goals Pt Will Perform Grooming: with min guard assist;standing Pt Will Perform Lower Body Bathing: sit to/from stand;sitting/lateral leans;with min guard assist Pt Will Perform Lower Body Dressing: with min guard assist;sitting/lateral leans;sit to/from stand Pt Will Transfer to Toilet: with min guard assist;ambulating;regular height  toilet;grab bars Additional ADL Goal #1: Pt will identify 3/5 ADL items in L visual field with min VC's Additional ADL Goal #2: Pt will safely navigate environment implementing visual field cut compensatory strategies at supervision level with min VC's  Plan Discharge plan needs to be updated    Co-evaluation    PT/OT/SLP Co-Evaluation/Treatment: Yes Reason for Co-Treatment: For patient/therapist safety;To address functional/ADL transfers   OT goals addressed during session: ADL's and self-care      AM-PAC OT "6 Clicks" Daily Activity     Outcome Measure   Help from another person eating meals?: A Little Help from another person taking care of personal grooming?: A Little Help from another person toileting, which includes using toliet, bedpan, or urinal?: A Lot Help from another person bathing (including washing, rinsing, drying)?: A Lot Help from another person to put on and taking off regular upper body clothing?: A Little Help from another person to put on and taking off regular lower body clothing?: A Lot 6 Click Score: 15    End of Session    OT Visit Diagnosis: Unsteadiness on feet (R26.81);Other abnormalities of gait and mobility (R26.89);Low vision, both eyes (H54.2);Other symptoms and signs involving cognitive function;Muscle weakness (generalized) (M62.81);History of falling (Z91.81)   Activity Tolerance Patient limited by lethargy;Other (comment) (n/v, headache)   Patient Left in bed;with call bell/phone within reach;with bed alarm set;with family/visitor present   Nurse Communication          Time: 2951-8841 OT Time Calculation (min): 33 min  Charges: OT General Charges $OT Visit: 1 Visit OT Treatments $Self Care/Home Management : 8-22 mins  Chelsea [name], OT Acute Rehabilitation Services Pager: [phone] Office: [phone]    Chelsea [name] 11/08/2019, 1:55 PM

## 2019-11-08 NOTE — Progress Notes (Signed)
Pt with no significant urine output >8hrs. Bladder scan reveals . Paged Dr. Rachael Darby who orders straight cath. Did discuss the daughter's wishes of the pt to have a foley, however myself and Dr Rachael Darby agreed that for infection risk sake the pt should follow protocol and receive an in and out this time. Daughter also voices concerns while I am bladder scanning the patient that "she [the patient] isn't drinking anything and is aspirating. She coughed when I gave her a drink earlier, you were in here, you heard it." The patient did cough at that time, however the daughter had been assisting the pt to drink while the patient was lying almost completely flat in bed. Did provide education at the time this occurred to sit the patient up to 45-60 degrees for safe swallowing, and did reinforce this again with the daughters concerned statement about aspiration. Also let the daughter know to please call us on the call light to assist with this as needed, for added safety. Did mention this to the MD as well, as the patient has had very poor PO fluid intake. "It's hard to justify replacing fluids on someone who is apparently retaining them, but I will order an LR bolus and maybe some continuous fluids." Appreciate Dr. Lezlie Octave assistance at this time.

## 2019-11-08 NOTE — Progress Notes (Signed)
Physical Therapy Treatment Patient Details Name: Chelsea [name] MRN: [id number] DOB: 1945/03/22 Today's Date: 11/08/2019    History of Present Illness 74 y/o female presenting with 1 week history of frequent falls, subtle right lower extremity weakness, L visual deficits, nausea and vomiting. MRI shows R hippocampus infarct. PMH includes HTN (uncontrolled, has not seen an MD in 15 years), DM-2, left carotid artery stenosis    PT Comments    Pt drowsy upon arrival to room, wakes with PT/OT entrance. Pt requiring mod-max +2 assist today for bed mobility tasks, at light assist to maintain upright sitting balance. Pt demonstrating L inattention with L lateral visual field cut, attends to L with significant cuing and head positional adjustment as pt holding head in L lateral flexion/R rotation. Pt with n/v at EOB, returned to supine for cessation of symptoms and BP stable with positional changes. PT recommending CIR to address mobility deficits, will continue to follow acutely.    Follow Up Recommendations  CIR     Equipment Recommendations  Other (comment) (TBD)    Recommendations for Other Services       Precautions / Restrictions Precautions Precautions: Fall Restrictions Weight Bearing Restrictions: No    Mobility  Bed Mobility Overal bed mobility: Needs Assistance Bed Mobility: Rolling;Sidelying to Sit;Sit to Supine Rolling: Mod assist Sidelying to sit: Mod assist;+2 for safety/equipment   Sit to supine: Max assist;+2 for physical assistance;+2 for safety/equipment;HOB elevated   General bed mobility comments: rolling bilaterally requiring mod assist for trunk and LE translation, increased time to perform secondary to pt dizziness. Mod-max+2 for supine<>sit for trunk and LE management, scooting to and from EOB, and boost up in bed with use of bed pads. Step-by-step sequencing required.  Transfers                 General transfer comment: unable to attempt 2/2 n/v  and fatigue/lethargy  Ambulation/Gait                 Stairs             Wheelchair Mobility    Modified Rankin (Stroke Patients Only) Modified Rankin (Stroke Patients Only) Pre-Morbid Rankin Score: Slight disability Modified Rankin: Moderately severe disability     Balance Overall balance assessment: Needs assistance Sitting-balance support: Bilateral upper extremity supported;Feet supported Sitting balance-Leahy Scale: Fair Sitting balance - Comments: fair to poor, with intermittent min assist required to maintain upright sitting. Head held in L lateral flexion and R rotation, minimally improved with cuing. EOB sitting x10 minutes                                    Cognition Arousal/Alertness: Lethargic Behavior During Therapy: Anxious;WFL for tasks assessed/performed Overall Cognitive Status: Impaired/Different from baseline Area of Impairment: Safety/judgement                         Safety/Judgement: Decreased awareness of safety;Decreased awareness of deficits     General Comments: more alert sitting EOB, initially drowsy and stating "I feel too weak (to get up)". Pt with L inattention sitting EOB, able to track towards L when cued.      Exercises      General Comments General comments (skin integrity, edema, etc.): nausea and dry heaving at EOB, L lateral visual field cut. Supine BP 166/66, sitting BP 166/72      Pertinent Vitals/Pain Pain  Assessment: Faces Faces Pain Scale: Hurts little more Pain Location: head, bilat shoulders during mobility Pain Descriptors / Indicators: Aching;Sore Pain Intervention(s): Limited activity within patient's tolerance;Monitored during session;Repositioned    Home Living                      Prior Function            PT Goals (current goals can now be found in the care plan section) Acute Rehab PT Goals Patient Stated Goal: return to independence PT Goal Formulation: With  patient Time For Goal Achievement: 11/21/19 Potential to Achieve Goals: Good Progress towards PT goals: Progressing toward goals    Frequency    Min 4X/week      PT Plan Current plan remains appropriate    Co-evaluation PT/OT/SLP Co-Evaluation/Treatment: Yes Reason for Co-Treatment: For patient/therapist safety;To address functional/ADL transfers   OT goals addressed during session: ADL's and self-care      AM-PAC PT "6 Clicks" Mobility   Outcome Measure  Help needed turning from your back to your side while in a flat bed without using bedrails?: A Little Help needed moving from lying on your back to sitting on the side of a flat bed without using bedrails?: A Lot Help needed moving to and from a bed to a chair (including a wheelchair)?: A Lot Help needed standing up from a chair using your arms (e.g., wheelchair or bedside chair)?: A Lot Help needed to walk in hospital room?: A Lot Help needed climbing 3-5 steps with a railing? : A Lot 6 Click Score: 13    End of Session Equipment Utilized During Treatment: Gait belt Activity Tolerance: Treatment limited secondary to medical complications (Comment) (dizziness, N/V) Patient left: in bed;with call bell/phone within reach;with family/visitor present;with bed alarm set (on stretcher in ED ) Nurse Communication: Mobility status PT Visit Diagnosis: Unsteadiness on feet (R26.81);Muscle weakness (generalized) (M62.81)     Time: 9622-2979 PT Time Calculation (min) (ACUTE ONLY): 28 min  Charges:  $Therapeutic Activity: 8-22 mins                     Chelsea [name] E, PT Acute Rehabilitation Services Pager [phone]  Office [phone]    Chelsea [name] 11/08/2019, 3:14 PM

## 2019-11-08 NOTE — Evaluation (Signed)
Clinical/Bedside Swallow Evaluation Patient Details  Name: Chelsea [name] MRN: [id number] Date of Birth: [birth year]  Today's Date: 11/08/2019 Time: SLP Start Time (ACUTE ONLY): 0902 SLP Stop Time (ACUTE ONLY): 0918 SLP Time Calculation (min) (ACUTE ONLY): 16 min  Past Medical History:  Past Medical History:  Diagnosis Date  . Blind left eye   . Cardiac arrest (HCC)   . Diabetes mellitus without complication (HCC)   . Hypertension   . Stroke [facility])    Past Surgical History: History reviewed. No pertinent surgical history. HPI:  74 y.o. female who has not seen a MD for almost 66 years-recently diagnosed with HTN, DM-2, left carotid artery stenosis (apparently scheduled for carotid endarterectomy late November)-presenting with 1 week history of frequent falls, subtle right lower extremity weakness, nausea and vomiting. MRI brain: Acute/subacute infarct in the right hippocampus without hemorrhage   Assessment / Plan / Recommendation Clinical Impression  Pts swallow appeared functional without overt s/sx of aspiration with any PO. 3 oz water challenge unremarkable,  vocal quality remained clear throughout PO trials. Mild generalized weakness noted with oral musculature however pt able to efficiently masticate solids with extended time. Recommend regular thin liquid diet as tolerated. No further ST needs identified.  SLP Visit Diagnosis: Dysphagia, unspecified (R13.10)    Aspiration Risk  Mild aspiration risk    Diet Recommendation   Regular, thin liquids   Medication Administration: Whole meds with liquid    Other  Recommendations Oral Care Recommendations: Oral care BID   Follow up Recommendations 24 hour supervision/assistance      Frequency and Duration            Prognosis        Swallow Study   General Date of Onset: 11/07/19 HPI: 74 y.o. female who has not seen a MD for almost 38 years-recently diagnosed with HTN, DM-2, left carotid artery stenosis (apparently  scheduled for carotid endarterectomy late November)-presenting with 1 week history of frequent falls, subtle right lower extremity weakness, nausea and vomiting. MRI brain: Acute/subacute infarct in the right hippocampus without hemorrhage Type of Study: Bedside Swallow Evaluation Previous Swallow Assessment: none on file Diet Prior to this Study: Thin liquids (clear liquids) Temperature Spikes Noted: No Respiratory Status: Room air History of Recent Intubation: No Behavior/Cognition: Alert;Cooperative Oral Cavity Assessment: Within Functional Limits Oral Care Completed by SLP: No Oral Cavity - Dentition: Adequate natural dentition Vision: Functional for self-feeding Self-Feeding Abilities: Needs set up Patient Positioning: Upright in bed Baseline Vocal Quality: Normal Volitional Cough: Strong Volitional Swallow: Able to elicit    Oral/Motor/Sensory Function Overall Oral Motor/Sensory Function: Generalized oral weakness   Ice Chips Ice chips: Impaired Presentation: Spoon Oral Phase Functional Implications: Prolonged oral transit Pharyngeal Phase Impairments: Suspected delayed Swallow;Multiple swallows   Thin Liquid Thin Liquid: Impaired Presentation: Cup;Straw Pharyngeal  Phase Impairments: Suspected delayed Swallow;Multiple swallows    Nectar Thick Nectar Thick Liquid: Not tested   Honey Thick Honey Thick Liquid: Not tested Presentation: Cup;Straw   Puree Puree: Within functional limits   Solid     Solid: Impaired Presentation: Self Fed Oral Phase Functional Implications: Prolonged oral transit Pharyngeal Phase Impairments: Suspected delayed Swallow;Cough - Delayed      Sarabi Sockwell E Javanna Patin MA, CCC-SLP  Acute Rehabilitation Services  11/08/2019,9:29 AM

## 2019-11-08 NOTE — Progress Notes (Signed)
STROKE TEAM PROGRESS NOTE   INTERVAL HISTORY Her daughter is at the bedside.  She presented with 1 week history of left-sided peripheral vision dysfunction did not seek immediate help for evaluation.  She denies any prior history of left hemispheric stroke but MRI shows what looks like a subacute left MCA branch infarct with CT angios showing 80% extracranial left carotid stenosis and left vertebral artery occlusion.  MRI also shows right PCA infarct.  Echocardiogram is unremarkable.  LDL cholesterol 121 mg percent.  Hemoglobin A1c is 10.1. Vitals:   11/07/19 2333 11/08/19 0025 11/08/19 0334 11/08/19 0744  BP: (!) 222/90 (!) 158/88 (!) 150/60 (!) 183/67  Pulse: 79  76 71  Resp: 19  18 18   Temp: 98.6 F (37 C)  98.1 F (36.7 C) 98.9 F (37.2 C)  TempSrc: Oral  Oral Oral  SpO2: 97%  94% 96%   CBC:  Recent Labs  Lab 11/07/19 0339 11/08/19 0406  WBC 11.1* 12.7*  NEUTROABS 8.6* 8.9*  HGB 12.2 13.0  HCT 36.3 38.6  MCV 93.1 92.6  PLT 299 311   Basic Metabolic Panel:  Recent Labs  Lab 11/07/19 0339 11/08/19 0406  NA 132* 137  K 3.9 3.5  CL 98 103  CO2 27 25  GLUCOSE 260* 165*  BUN 15 14  CREATININE 0.82 1.04*  CALCIUM 8.9 9.2   Lipid Panel:  Recent Labs  Lab 11/08/19 0406  CHOL 173  TRIG 82  HDL 36*  CHOLHDL 4.8  VLDL 16  LDLCALC 11/10/19*   HgbA1c:  Recent Labs  Lab 11/07/19 0339  HGBA1C 10.1*   Urine Drug Screen: No results for input(s): LABOPIA, COCAINSCRNUR, LABBENZ, AMPHETMU, THCU, LABBARB in the last 168 hours.  Alcohol Level No results for input(s): ETH in the last 168 hours.  IMAGING past 24 hours No results found.  PHYSICAL EXAM Pleasant elderly lady not in distress. . Afebrile. Head is nontraumatic. Neck is supple without bruit.    Cardiac exam no murmur or gallop. Lungs are clear to auscultation. Distal pulses are well felt. Neurological Exam :  Awake alert oriented to time place and person.  Extraocular movements are full range except slight saccadic  dysmetria to the left.  Left partial homonymous hemianopsia.  Mild left lower facial asymmetry.  Tongue midline.  Motor system exam no upper or lower extremity drift but diminished fine finger movements on the left and orbits right over left upper extremity.  Mild left grip weakness.  Slight left finger-to-nose and knee to heel incoordination.  Sensation slightly diminished on the left compared to the right.  Gait not tested. ASSESSMENT/PLAN [name] is a 74 y.o. female with history of of stroke, hypertension, diabetes, cardiac arrest presenting with hypertensive urgency and hyperglycemia. Found to have L eye field cut. Known L ICA stenosis w/ planned CEA late Nov once BP under control..    Stroke:   R PCA infarct in setting of left vertebral artery occlusion along with severe L ICA stenosis with previously silent subacute left parietal infarct secondary to large vessel disease  CT head No acute abnormality. Small vessel disease. Atrophy. Small chronic L parietal and R caudate infarcts.   CTA head & neck R medial temporal lobe hypodensity. L VA origin occlusion. Proximal L ICA 80% stenosis. R ICA atherosclerosis. B PCA diffuse intracranial atherosclerosis.  MRI  R hippocampus infarct. Mild small vessel disease. L parietal chronic microhemorrhages.  Carotid Doppler  11/02/2019 L ICA 80-99% stenosis   2D Echo  bubble  EF 60-65%. No source of embolus. LA mildly dilated. Neg bubble  LDL 121  HgbA1c 10.1  VTE prophylaxis - Lovenox 40 mg sq daily   aspirin 81 mg daily prior to admission, now on aspirin 81 mg and Plavix 75 daily into 3 months.  Followed by Plavix alone  Therapy recommendations:  CIR  Disposition:  pending   Carotid Stenosis   Carotid Doppler  11/02/2019 L ICA 80-99% stenosis   CTA neck Proximal L ICA 80% stenosis.   Planned L CEA by Early late Nov once BP controlled  Hypertensive Urgency  BP as high as 222/90 on admission  . Permissive hypertension (OK if <  220/120) but gradually normalize in 5-7 days . SBP goal 130-150 given ICA stenosis   Hyperlipidemia  Home meds:  No statin  LDL 121, goal < 70  Now on lipitor 80  Continue statin at discharge  Diabetes type II Uncontrolled  HgbA1c 10.1, goal < 7.0  CBGs Recent Labs    11/07/19 1656 11/07/19 2125 11/08/19 0643  GLUCAP 215* 137* 124*      SSI  Other Stroke Risk Factors  Advanced age  Cigarette smoker, advised to stop smoking  Hx stroke/TIA - etiology unclear. Resultant R hemiparesis  Family hx stroke (father)  Other Active Problems  Hx cardiac arrest per pt   Nausea/vomiting, ongoing heaves w/ position change  Hx postherpetic neuralgia on neurontin  anxiety  Hospital day # 1  She presented with 1 week history of left-sided vision difficulties secondary to right PCA infarct as well as MRI also shows subacute left MCA infarct likely symptomatic from proximal high-grade carotid stenosis for which she may need interval revascularization after recovering from the stroke.  Recommend aspirin 81 mg and Plavix 75 daily into 3 months.  Followed by Plavix alone.  Aggressive risk factor modification.  Elective left carotid revascularization after recovering from the stroke.  Patient advised not to drive till peripheral vision improves.  Greater than 50% time during this 35-minute visit was spent on counseling and coordination of care about her stroke and vision loss and discussion about stroke risk from carotid stenosis and need for carotid revascularization and answered questions.  Discussed with Dr.Ghimire Delia Heady, MD To contact Stroke Continuity provider, please refer to [email or web address]. After hours, contact General Neurology

## 2019-11-08 NOTE — Progress Notes (Signed)
Discussed no void this shift with attending MD.  Patient is nauseous and isn't eating or drinking.  She wasn't able to get OOB to chair today due to Nausea.  MD recommended to wait another hour and bladder scan.  Will likely need to have prn order for I/O cath.  She is not able to get up to [facility] to attempt void.  Discussed with oncoming RN.  MD is aware that patient's last void was via I/O cath at 0500 on this date and a liter of urine was retrieved.  Highest bladder scan at this time remains at .  Will continue to monitor.

## 2019-11-08 NOTE — Progress Notes (Signed)
PROGRESS NOTE        PATIENT DETAILS Name: Chelsea JanskySandra M [name] Age: 74 y.o. Sex: female Date of Birth: [birth year] Admit Date: 11/07/2019 Admitting Physician Dewayne ShorterShanker Levora DredgeM Markeshia Giebel, MD ZOX:[id number]:Lazoff, Jovita GammaShawn P, DO  Brief Narrative: Patient is a 74 y.o. female who has not seen a MD for almost 15 years-recently diagnosed with HTN, DM-2, left carotid artery stenosis (apparently scheduled for carotid endarterectomy late November)-presenting with 1 week history of frequent falls, subtle right lower extremity weakness, nausea and vomiting.   Significant events: None  Significant studies: 10/27>> MRI brain: Acute/subacute infarct in the right hippocampus without hemorrhage. 10/27>> CTA head/neck: Left vertebral artery occlusion, 80% stenosis at the left internal carotid artery, diffuse intracranial atherosclerotic disease. 10/27>> x-ray abdomen: Nonobstructive bowel gas pattern 10/27>> A1c: 10.1 10/28>> LDL 121 10/28>> Echo: EF 60-65%, grade 1 diastolic dysfunction.  Negative bubble study.  Antimicrobial therapy: None  Microbiology data: None  Procedures : None  Consults: Neurology  DVT Prophylaxis : enoxaparin (LOVENOX) injection 40 mg Start: 11/07/19 1000  Subjective: Continues to have significant anxiety issues-apparently did not sleep well.  Apparently has a history of postherpetic neuralgia involving the posterior aspect of her shoulder-and has neuropathy that bother her and prevented her from sleeping.  Also complains of occasional myoclonic jerks.  No nausea vomiting since yesterday.  Assessment/Plan: Acute CVA: No change in focal neurological deficits-slight right leg weakness-speech clear-work-up as above.  Continue aspirin/statin-await further recommendations from neurology.   Uncontrolled HTN: Initially some amount of permissive hypertension was allowed-we will start Coreg and slowly start decreasing BP.  Restarted on IV fluids last night-we will go ahead  and stop today.  Follow and optimize.   Nausea/Vomiting: not sure if related to CVA/Uncontrolled HTN-or has developed gastroparesis due to longstanding diabetes (A1c 10.1)-abdominal exam is benign-apparently no further vomiting since yesterday afternoon-continue with antiemetics-and other supportive care.  Lipase levels were normal.  Advance diet as tolerated.  DM-2: CBG stable-continue with SSI.  Recent Labs    11/07/19 1656 11/07/19 2125 11/08/19 0643  GLUCAP 215* 137* 124*   History of postherpetic neuralgia: Apparently takes Neurontin (not listed on the home medication list)-however this is listed as an allergy.  Have spoken with pharmacy-they will clarify with patient  Anxiety: Significant anxiety component-patient scared of side effect of medications (has had numerous anaphylaxis in the past per history)-daughter at bedside is also very anxious.  Have provided reassurance-follow-may need as needed benzos if this much of anxiety continues.  ?  Myoclonic jerks: Apparently started developing yesterday-did not witness any while I was in the room-we will await neurology eval.  Not sure if related to her not taking Neurontin.  Electrolytes/renal function within normal limits.  Acute urinary retention: Developed last night-required in and out cath x1-follow-up today with frequent bladder scans.  Encourage ambulation-out of bed to chair.  Tobacco abuse: Counseled   Diet: Diet Order            Diet clear liquid Room service appropriate? Yes; Fluid consistency: Thin  Diet effective now                  Code Status: Full code   Family Communication: Daughter at bedside  Disposition Plan: Status is: Inpatient  The patient will require care spanning > 2 midnights and should be moved to inpatient because: Inpatient level of care appropriate due  to severity of illness  Dispo: The patient is from: Home              Anticipated d/c is to: TBD              Anticipated d/c date is:  2 days              Patient currently is not medically stable to d/c.  Barriers to Discharge: Stroke work-up ongoing-uncontrolled hypertension-nausea/vomiting-diet being advanced.  May need CIR on discharge.  Antimicrobial agents: Anti-infectives (From admission, onward)   None       Time spent: 25-minutes-Greater than 50% of this time was spent in counseling, explanation of diagnosis, planning of further management, and coordination of care.  MEDICATIONS: Scheduled Meds: .  stroke: mapping our early stages of recovery book   Does not apply Once  . aspirin EC  325 mg Oral Daily  . atorvastatin  80 mg Oral Daily  . carvedilol  6.25 mg Oral BID WC  . enoxaparin (LOVENOX) injection  40 mg Subcutaneous Daily  . insulin aspart  0-15 Units Subcutaneous TID AC & HS   Continuous Infusions: . lactated ringers 75 mL/hr at 11/08/19 0621   PRN Meds:.acetaminophen **OR** acetaminophen, diphenhydrAMINE, hydrALAZINE, ondansetron **OR** ondansetron (ZOFRAN) IV, polyethylene glycol, promethazine   PHYSICAL EXAM: Vital signs: Vitals:   11/07/19 2333 11/08/19 0025 11/08/19 0334 11/08/19 0744  BP: (!) 222/90 (!) 158/88 (!) 150/60 (!) 183/67  Pulse: 79  76 71  Resp: 19  18 18   Temp: 98.6 F (37 C)  98.1 F (36.7 C) 98.9 F (37.2 C)  TempSrc: Oral  Oral Oral  SpO2: 97%  94% 96%   There were no vitals filed for this visit. There is no height or weight on file to calculate BMI.   Gen Exam:Alert awake-not in any distress HEENT:atraumatic, normocephalic Chest: B/L clear to auscultation anteriorly CVS:S1S2 regular Abdomen:soft non tender, non distended Extremities:no edema Neurology: Minimal/mild right leg weakness. Skin: no rash  I have personally reviewed following labs and imaging studies  LABORATORY DATA: CBC: Recent Labs  Lab 11/06/19 0204 11/07/19 0339 11/08/19 0406  WBC 8.9 11.1* 12.7*  NEUTROABS 6.6 8.6* 8.9*  HGB 12.4 12.2 13.0  HCT 36.7 36.3 38.6  MCV 92.7 93.1  92.6  PLT 295 299 311    Basic Metabolic Panel: Recent Labs  Lab 11/06/19 0204 11/07/19 0339 11/08/19 0406  NA 135 132* 137  K 4.1 3.9 3.5  CL 98 98 103  CO2 27 27 25   GLUCOSE 345* 260* 165*  BUN 26* 15 14  CREATININE 0.86 0.82 1.04*  CALCIUM 8.7* 8.9 9.2    GFR: Estimated Creatinine Clearance: 45.5 mL/min (A) (by C-G formula based on SCr of 1.04 mg/dL (H)).  Liver Function Tests: Recent Labs  Lab 11/06/19 0204 11/07/19 0339 11/08/19 0406  AST 14* 17 17  ALT 13 14 14   ALKPHOS 81 78 73  BILITOT 0.7 0.5 0.8  PROT 6.0* 5.8* 5.8*  ALBUMIN 3.4* 3.3* 3.3*   Recent Labs  Lab 11/06/19 0204  LIPASE 30   No results for input(s): AMMONIA in the last 168 hours.  Coagulation Profile: Recent Labs  Lab 11/08/19 0406  INR 1.0    Cardiac Enzymes: No results for input(s): CKTOTAL, CKMB, CKMBINDEX, TROPONINI in the last 168 hours.  BNP (last 3 results) No results for input(s): PROBNP in the last 8760 hours.  Lipid Profile: Recent Labs    11/08/19 0406  CHOL 173  HDL 36*  LDLCALC 121*  TRIG 82  CHOLHDL 4.8    Thyroid Function Tests: No results for input(s): TSH, T4TOTAL, FREET4, T3FREE, THYROIDAB in the last 72 hours.  Anemia Panel: No results for input(s): VITAMINB12, FOLATE, FERRITIN, TIBC, IRON, RETICCTPCT in the last 72 hours.  Urine analysis:    Component Value Date/Time   COLORURINE STRAW (A) 11/07/2019 1019   APPEARANCEUR CLEAR 11/07/2019 1019   LABSPEC 1.010 11/07/2019 1019   PHURINE 7.0 11/07/2019 1019   GLUCOSEU >=500 (A) 11/07/2019 1019   HGBUR NEGATIVE 11/07/2019 1019   BILIRUBINUR NEGATIVE 11/07/2019 1019   KETONESUR NEGATIVE 11/07/2019 1019   PROTEINUR 100 (A) 11/07/2019 1019   UROBILINOGEN 0.2 09/17/2008 1929   NITRITE NEGATIVE 11/07/2019 1019   LEUKOCYTESUR NEGATIVE 11/07/2019 1019    Sepsis Labs: Lactic Acid, Venous No results found for: LATICACIDVEN  MICROBIOLOGY: Recent Results (from the past 240 hour(s))  Urine culture      Status: Abnormal   Collection Time: 11/06/19  4:55 AM   Specimen: Urine, Clean Catch  Result Value Ref Range Status   Specimen Description   Final    URINE, CLEAN CATCH Performed at [facility], [address], [place], [place] [address]    Special Requests   Final    NONE Performed at [facility], [address] [place], [place] [address]    Culture MULTIPLE SPECIES PRESENT, SUGGEST RECOLLECTION (A)  Final   Report Status 11/08/2019 FINAL  Final  Respiratory Panel by RT PCR (Flu A&B, Covid) - Nasopharyngeal Swab     Status: None   Collection Time: 11/07/19  3:43 AM   Specimen: Nasopharyngeal Swab  Result Value Ref Range Status   SARS Coronavirus 2 by RT PCR NEGATIVE NEGATIVE Final    Comment: (NOTE) SARS-CoV-2 target nucleic acids are NOT DETECTED.  The SARS-CoV-2 RNA is generally detectable in upper respiratoy specimens during the acute phase of infection. The lowest concentration of SARS-CoV-2 viral copies this assay can detect is 131 copies/mL. A negative result does not preclude SARS-Cov-2 infection and should not be used as the sole basis for treatment or other patient management decisions. A negative result may occur with  improper specimen collection/handling, submission of specimen other than nasopharyngeal swab, presence of viral mutation(s) within the areas targeted by this assay, and inadequate number of viral copies (<131 copies/mL). A negative result must be combined with clinical observations, patient history, and epidemiological information. The expected result is Negative.  Fact Sheet for Patients:  https://www.moore.com/  Fact Sheet for Healthcare Providers:  https://www.young.biz/  This test is no t yet approved or cleared by the [place] FDA and  has been authorized for detection and/or diagnosis of SARS-CoV-2 by FDA under an Emergency Use Authorization (EUA). This EUA will remain  in effect (meaning this  test can be used) for the duration of the COVID-19 declaration under Section 564(b)(1) of the Act, 21 U.S.C. section 360bbb-3(b)(1), unless the authorization is terminated or revoked sooner.     Influenza A by PCR NEGATIVE NEGATIVE Final   Influenza B by PCR NEGATIVE NEGATIVE Final    Comment: (NOTE) The Xpert Xpress SARS-CoV-2/FLU/RSV assay is intended as an aid in  the diagnosis of influenza from Nasopharyngeal swab specimens and  should not be used as a sole basis for treatment. Nasal washings and  aspirates are unacceptable for Xpert Xpress SARS-CoV-2/FLU/RSV  testing.  Fact Sheet for Patients: https://www.moore.com/  Fact Sheet for Healthcare Providers: https://www.young.biz/  This test is not yet approved or cleared by  the [employer] and  has been authorized for detection and/or diagnosis of SARS-CoV-2 by  FDA under an Emergency Use Authorization (EUA). This EUA will remain  in effect (meaning this test can be used) for the duration of the  Covid-19 declaration under Section 564(b)(1) of the Act, 21  U.S.C. section 360bbb-3(b)(1), unless the authorization is  terminated or revoked. Performed at [facility], [address], [place], [place] [address]     RADIOLOGY STUDIES/RESULTS: CT ANGIO HEAD W OR WO CONTRAST  Addendum Date: 11/07/2019   ADDENDUM REPORT: 11/07/2019 09:10 ADDENDUM: These results were called by telephone at the time of interpretation on 11/07/2019 at 9:10 am to provider Hennessy Bartel, who verbally acknowledged these results. Electronically Signed   By: Marlan Palau M.D.   On: 11/07/2019 09:10   Result Date: 11/07/2019 CLINICAL DATA:  Dizziness.  Intractable vomiting.  Hypertension. EXAM: CT ANGIOGRAPHY HEAD AND NECK TECHNIQUE: Multidetector CT imaging of the head and neck was performed using the standard protocol during bolus administration of intravenous contrast. Multiplanar CT image reconstructions and  MIPs were obtained to evaluate the vascular anatomy. Carotid stenosis measurements (when applicable) are obtained utilizing NASCET criteria, using the distal internal carotid diameter as the denominator. CONTRAST:  40mL OMNIPAQUE IOHEXOL 350 MG/ML SOLN COMPARISON:  CT head 10/30/2019 FINDINGS: CT HEAD FINDINGS Brain: Ill-defined hypodensity in the right medial temporal lobe lateral to the temporal horn has progressed since the CT of 10/30/2019. This is most consistent with subacute infarct. Mild atrophy. Negative for hydrocephalus. Patchy will chronic microvascular ischemic change in the white matter. Chronic lacunar infarction right caudate unchanged. Negative for intracranial hemorrhage or mass. Vascular: Negative for hyperdense vessel Skull: Negative Sinuses: Paranasal sinuses clear. Orbits: Negative Review of the MIP images confirms the above findings CTA NECK FINDINGS Aortic arch: Advanced atherosclerotic disease in the aortic arch and proximal great vessels. 50% diameter stenosis proximal left subclavian artery. Right carotid system: Atherosclerotic disease throughout the right common carotid artery extending to the carotid bifurcation and internal carotid artery. No significant stenosis. Left carotid system: Atherosclerotic disease left carotid bifurcation. Noncalcified focal stenosis proximal left internal carotid artery estimated 80% diameter stenosis. Vertebral arteries: Right vertebral artery is patent to the basilar without significant stenosis. Left vertebral artery is occluded at the origin with reconstitution at the skull base. Skeleton: No acute skeletal abnormality. Other neck: Negative Upper chest: Lung apices clear bilaterally. Review of the MIP images confirms the above findings CTA HEAD FINDINGS Anterior circulation: Atherosclerotic disease throughout the cavernous carotid bilaterally with mild to moderate stenosis. Anterior and middle cerebral arteries are patent without large vessel occlusion.  There is diffuse irregularity and atherosclerotic disease in the anterior and middle cerebral arteries without focal flow limiting stenosis. Posterior circulation: Right vertebral artery supplies the basilar with atherosclerotic irregularity distally but no significant stenosis. There is opacification distal left vertebral artery which is diffusely diseased. This is likely retrograde flow. PICA patent bilaterally. Diffuse atherosclerotic irregularity in the basilar with mild stenosis in the midportion. Superior cerebellar arteries are patent bilaterally. Both posterior cerebral artery patent with diffuse atherosclerotic irregularity and moderate stenosis. Venous sinuses: Normal venous enhancement Anatomic variants: None Review of the MIP images confirms the above findings IMPRESSION: 1. Ill-defined hypodensity right medial temporal lobe has progressed since the recent CT of 10/30/2019 and most consistent with subacute infarct. Recommend MRI head for further evaluation. 2. Occlusion left vertebral artery at the origin. Chronicity of this occlusion is uncertain. Right vertebral artery widely patent  to the basilar. 3. 80% focal noncalcified stenosis proximal left internal carotid artery. 4. Atherosclerotic disease throughout the right carotid artery without significant stenosis. 5. Diffuse intracranial atherosclerotic disease most severe in the posterior cerebral arteries bilaterally. Electronically Signed: By: Marlan Palau M.D. On: 11/07/2019 08:27   CT ANGIO NECK W OR WO CONTRAST  Addendum Date: 11/07/2019   ADDENDUM REPORT: 11/07/2019 09:10 ADDENDUM: These results were called by telephone at the time of interpretation on 11/07/2019 at 9:10 am to provider Reiner Loewen, who verbally acknowledged these results. Electronically Signed   By: Marlan Palau M.D.   On: 11/07/2019 09:10   Result Date: 11/07/2019 CLINICAL DATA:  Dizziness.  Intractable vomiting.  Hypertension. EXAM: CT ANGIOGRAPHY HEAD AND NECK  TECHNIQUE: Multidetector CT imaging of the head and neck was performed using the standard protocol during bolus administration of intravenous contrast. Multiplanar CT image reconstructions and MIPs were obtained to evaluate the vascular anatomy. Carotid stenosis measurements (when applicable) are obtained utilizing NASCET criteria, using the distal internal carotid diameter as the denominator. CONTRAST:  69mL OMNIPAQUE IOHEXOL 350 MG/ML SOLN COMPARISON:  CT head 10/30/2019 FINDINGS: CT HEAD FINDINGS Brain: Ill-defined hypodensity in the right medial temporal lobe lateral to the temporal horn has progressed since the CT of 10/30/2019. This is most consistent with subacute infarct. Mild atrophy. Negative for hydrocephalus. Patchy will chronic microvascular ischemic change in the white matter. Chronic lacunar infarction right caudate unchanged. Negative for intracranial hemorrhage or mass. Vascular: Negative for hyperdense vessel Skull: Negative Sinuses: Paranasal sinuses clear. Orbits: Negative Review of the MIP images confirms the above findings CTA NECK FINDINGS Aortic arch: Advanced atherosclerotic disease in the aortic arch and proximal great vessels. 50% diameter stenosis proximal left subclavian artery. Right carotid system: Atherosclerotic disease throughout the right common carotid artery extending to the carotid bifurcation and internal carotid artery. No significant stenosis. Left carotid system: Atherosclerotic disease left carotid bifurcation. Noncalcified focal stenosis proximal left internal carotid artery estimated 80% diameter stenosis. Vertebral arteries: Right vertebral artery is patent to the basilar without significant stenosis. Left vertebral artery is occluded at the origin with reconstitution at the skull base. Skeleton: No acute skeletal abnormality. Other neck: Negative Upper chest: Lung apices clear bilaterally. Review of the MIP images confirms the above findings CTA HEAD FINDINGS Anterior  circulation: Atherosclerotic disease throughout the cavernous carotid bilaterally with mild to moderate stenosis. Anterior and middle cerebral arteries are patent without large vessel occlusion. There is diffuse irregularity and atherosclerotic disease in the anterior and middle cerebral arteries without focal flow limiting stenosis. Posterior circulation: Right vertebral artery supplies the basilar with atherosclerotic irregularity distally but no significant stenosis. There is opacification distal left vertebral artery which is diffusely diseased. This is likely retrograde flow. PICA patent bilaterally. Diffuse atherosclerotic irregularity in the basilar with mild stenosis in the midportion. Superior cerebellar arteries are patent bilaterally. Both posterior cerebral artery patent with diffuse atherosclerotic irregularity and moderate stenosis. Venous sinuses: Normal venous enhancement Anatomic variants: None Review of the MIP images confirms the above findings IMPRESSION: 1. Ill-defined hypodensity right medial temporal lobe has progressed since the recent CT of 10/30/2019 and most consistent with subacute infarct. Recommend MRI head for further evaluation. 2. Occlusion left vertebral artery at the origin. Chronicity of this occlusion is uncertain. Right vertebral artery widely patent to the basilar. 3. 80% focal noncalcified stenosis proximal left internal carotid artery. 4. Atherosclerotic disease throughout the right carotid artery without significant stenosis. 5. Diffuse intracranial atherosclerotic disease most severe in the posterior  cerebral arteries bilaterally. Electronically Signed: By: Marlan Palau M.D. On: 11/07/2019 08:27   MR BRAIN WO CONTRAST  Addendum Date: 11/07/2019   ADDENDUM REPORT: 11/07/2019 09:10 ADDENDUM: These results were called by telephone at the time of interpretation on 11/07/2019 at 9:09 am to provider Netta Fodge, who verbally acknowledged these results. Electronically Signed    By: Marlan Palau M.D.   On: 11/07/2019 09:10   Result Date: 11/07/2019 CLINICAL DATA:  Acute neuro deficit. Stroke. Hypertension and diabetes. EXAM: MRI HEAD WITHOUT CONTRAST TECHNIQUE: Multiplanar, multiecho pulse sequences of the brain and surrounding structures were obtained without intravenous contrast. COMPARISON:  CT angio head and neck 11/07/2019 FINDINGS: Brain: Restricted diffusion in the right hippocampus compatible with acute/subacute infarct. This corresponds to the CT hypodensity. No associated hemorrhage. No other areas of restricted diffusion. Patchy white matter chronic ischemic changes are noted. There is facilitated diffusion in the left parietal infarct which may be late subacute or chronic. Ventricle size normal. Cerebral volume normal for age. Negative for mass lesion. Chronic microhemorrhage right cerebellum. Mild chronic hemorrhage in the left parietal infarct. Vascular: Normal arterial flow voids. Skull and upper cervical spine: Negative Sinuses/Orbits: Paranasal sinuses clear.  Negative orbit. Other: None IMPRESSION: Acute or subacute infarct in the right hippocampus without hemorrhage Mild chronic ischemic changes. Areas of chronic microhemorrhage including in the left parietal infarct which is small. Electronically Signed: By: Marlan Palau M.D. On: 11/07/2019 08:52   DG Abd 2 Views  Result Date: 11/07/2019 CLINICAL DATA:  3 day history of nausea and vomiting. EXAM: ABDOMEN - 2 VIEW COMPARISON:  No comparison studies available. FINDINGS: Gas-filled central small bowel loops are nondilated. Air in stool are seen scattered along the length of a nondilated colon. Atelectasis or scarring noted at the right base. Telemetry leads overlie the abdomen. IMPRESSION: 1. Nonobstructive bowel gas pattern. 2. Atelectasis or scarring at the right lung base. Electronically Signed   By: Kennith Center M.D.   On: 11/07/2019 07:35   ECHOCARDIOGRAM COMPLETE BUBBLE STUDY  Result Date:  11/07/2019    ECHOCARDIOGRAM REPORT   Patient Name:   SHALISA MCQUADE Symonette Date of Exam: 11/07/2019 Medical Rec #:  [id number]      Height:       67.0 in Accession #:    [id number]     Weight:       141.1 lb Date of Birth:  [birth year]      BSA:          1.744 m Patient Age:    74 years       BP:           199/78 mmHg Patient Gender: F              HR:           59 bpm. Exam Location:  Inpatient Procedure: 2D Echo, Cardiac Doppler, Color Doppler and Saline Contrast Bubble            Study Indications:    Stroke 434.91 / I163.9  History:        Patient has no prior history of Echocardiogram examinations.                 Risk Factors:Hypertension and Diabetes. Cardiac Arrest.  Sonographer:    Tiffany Dance Referring Phys: [id number] Deno Lunger SHALHOUB IMPRESSIONS  1. Left ventricular ejection fraction, by estimation, is 60 to 65%. The left ventricle has normal function. The left ventricle has no regional wall motion abnormalities. There  is mild asymmetric left ventricular hypertrophy of the basal-septal segment. Left ventricular diastolic parameters are consistent with Grade I diastolic dysfunction (impaired relaxation). Elevated left ventricular end-diastolic pressure.  2. Right ventricular systolic function is normal. The right ventricular size is normal.  3. Left atrial size was moderately dilated.  4. The mitral valve is normal in structure. Trivial mitral valve regurgitation. No evidence of mitral stenosis. Moderate mitral annular calcification.  5. The aortic valve is tricuspid. There is mild calcification of the aortic valve. There is mild thickening of the aortic valve. Aortic valve regurgitation is not visualized. Mild aortic valve sclerosis is present, with no evidence of aortic valve stenosis.  6. The inferior vena cava is normal in size with greater than 50% respiratory variability, suggesting right atrial pressure of 3 mmHg.  7. Agitated saline contrast bubble study was negative, with no evidence of any interatrial  shunt. Comparison(s): No prior Echocardiogram. Conclusion(s)/Recommendation(s): Normal biventricular function without evidence of hemodynamically significant valvular heart disease. No intracardiac source of embolism detected on this transthoracic study. A transesophageal echocardiogram is recommended to exclude cardiac source of embolism if clinically indicated. FINDINGS  Left Ventricle: Left ventricular ejection fraction, by estimation, is 60 to 65%. The left ventricle has normal function. The left ventricle has no regional wall motion abnormalities. The left ventricular internal cavity size was normal in size. There is  mild asymmetric left ventricular hypertrophy of the basal-septal segment. Left ventricular diastolic parameters are consistent with Grade I diastolic dysfunction (impaired relaxation). Elevated left ventricular end-diastolic pressure. Right Ventricle: The right ventricular size is normal. No increase in right ventricular wall thickness. Right ventricular systolic function is normal. Left Atrium: Left atrial size was moderately dilated. Right Atrium: Right atrial size was normal in size. Pericardium: There is no evidence of pericardial effusion. Mitral Valve: The mitral valve is normal in structure. Moderate mitral annular calcification. Trivial mitral valve regurgitation. No evidence of mitral valve stenosis. Tricuspid Valve: The tricuspid valve is normal in structure. Tricuspid valve regurgitation is trivial. No evidence of tricuspid stenosis. Aortic Valve: The aortic valve is tricuspid. There is mild calcification of the aortic valve. There is mild thickening of the aortic valve. Aortic valve regurgitation is not visualized. Mild aortic valve sclerosis is present, with no evidence of aortic valve stenosis. Pulmonic Valve: The pulmonic valve was not well visualized. Pulmonic valve regurgitation is mild. No evidence of pulmonic stenosis. Aorta: The aortic root, ascending aorta and aortic arch are  all structurally normal, with no evidence of dilitation or obstruction. Venous: The inferior vena cava is normal in size with greater than 50% respiratory variability, suggesting right atrial pressure of 3 mmHg. IAS/Shunts: No atrial level shunt detected by color flow Doppler. Agitated saline contrast was given intravenously to evaluate for intracardiac shunting. Agitated saline contrast bubble study was negative, with no evidence of any interatrial shunt.  LEFT VENTRICLE PLAX 2D LVIDd:         4.49 cm  Diastology LVIDs:         3.36 cm  LV e' medial:    3.92 cm/s LV PW:         0.97 cm  LV E/e' medial:  17.0 LV IVS:        1.41 cm  LV e' lateral:   4.90 cm/s LVOT diam:     2.30 cm  LV E/e' lateral: 13.6 LV SV:         107 LV SV Index:   61 LVOT Area:  4.15 cm  RIGHT VENTRICLE             IVC RV Basal diam:  2.89 cm     IVC diam: 1.60 cm RV S prime:     12.50 cm/s TAPSE (M-mode): 2.6 cm LEFT ATRIUM             Index       RIGHT ATRIUM           Index LA diam:        3.90 cm 2.24 cm/m  RA Area:     14.80 cm LA Vol (A2C):   81.5 ml 46.74 ml/m RA Volume:   38.20 ml  21.91 ml/m LA Vol (A4C):   60.2 ml 34.53 ml/m LA Biplane Vol: 70.3 ml 40.32 ml/m  AORTIC VALVE LVOT Vmax:   94.45 cm/s LVOT Vmean:  64.600 cm/s LVOT VTI:    0.258 m  AORTA Ao Root diam: 3.70 cm Ao Asc diam:  3.40 cm MITRAL VALVE MV Area (PHT): 1.87 cm     SHUNTS MV Decel Time: 405 msec     Systemic VTI:  0.26 m MV E velocity: 66.50 cm/s   Systemic Diam: 2.30 cm MV A velocity: 114.00 cm/s MV E/A ratio:  0.58 Jodelle Red MD Electronically signed by Jodelle Red MD Signature Date/Time: 11/07/2019/3:25:37 PM    Final      LOS: 1 day   Jeoffrey Massed, MD  [facility]    To contact the attending provider between 7A-7P or the covering provider during after hours 7P-7A, please log into the web site [email or web address] and access using universal [place] password for that web site. If you do not have the password, please  call the hospital operator.  11/08/2019, 9:07 AM

## 2019-11-08 NOTE — Progress Notes (Signed)
Rehab Admissions Coordinator Note:  Patient was screened by Clois Dupes for appropriateness for an Inpatient Acute Rehab Consult per therapy recs. .  At this time, we are recommending Inpatient Rehab consult. I will place order per protocol.  Clois Dupes RN MSN 11/08/2019, 4:25 PM  I can be reached at [phone].

## 2019-11-08 NOTE — Progress Notes (Signed)
Pt daughter complaining of "jerking" in pts arms - pt appears to shiver or have some sort of muscle twitch in R arm very periodically. Grips are even, sensation present, NIH unchanged from previous, pt remains talking and alert throughout the intermittent episodes. Vital signs are stable now after receiving hydralazine IV for SBP 222. Paged on call MD and relayed all of the above, MD agreeable to put in Benedryl IV as he believes this is likely an anxiety response. "If she is talking throughout the event, we can rest assured that she is not seizing. Hopefully she can get some rest and that will help this issue. Call back if further concerns."

## 2019-11-09 ENCOUNTER — Other Ambulatory Visit: Payer: Self-pay

## 2019-11-09 ENCOUNTER — Encounter (HOSPITAL_COMMUNITY): Payer: Self-pay | Admitting: Physical Medicine & Rehabilitation

## 2019-11-09 ENCOUNTER — Inpatient Hospital Stay (HOSPITAL_COMMUNITY)
Admission: RE | Admit: 2019-11-09 | Discharge: 2019-12-03 | DRG: 057 | Disposition: A | Discharge status: Other Acute Inpatient Hospital | Payer: PPO | Source: Intra-hospital | Attending: Physical Medicine & Rehabilitation | Admitting: Physical Medicine & Rehabilitation

## 2019-11-09 DIAGNOSIS — I10 Essential (primary) hypertension: Secondary | ICD-10-CM

## 2019-11-09 DIAGNOSIS — I6522 Occlusion and stenosis of left carotid artery: Secondary | ICD-10-CM | POA: Diagnosis not present

## 2019-11-09 DIAGNOSIS — K5900 Constipation, unspecified: Secondary | ICD-10-CM | POA: Diagnosis present

## 2019-11-09 DIAGNOSIS — E860 Dehydration: Secondary | ICD-10-CM | POA: Diagnosis present

## 2019-11-09 DIAGNOSIS — I69354 Hemiplegia and hemiparesis following cerebral infarction affecting left non-dominant side: Secondary | ICD-10-CM | POA: Diagnosis not present

## 2019-11-09 DIAGNOSIS — R414 Neurologic neglect syndrome: Secondary | ICD-10-CM | POA: Diagnosis not present

## 2019-11-09 DIAGNOSIS — E785 Hyperlipidemia, unspecified: Secondary | ICD-10-CM | POA: Diagnosis present

## 2019-11-09 DIAGNOSIS — B9620 Unspecified Escherichia coli [E. coli] as the cause of diseases classified elsewhere: Secondary | ICD-10-CM | POA: Diagnosis present

## 2019-11-09 DIAGNOSIS — F17210 Nicotine dependence, cigarettes, uncomplicated: Secondary | ICD-10-CM

## 2019-11-09 DIAGNOSIS — B0229 Other postherpetic nervous system involvement: Secondary | ICD-10-CM

## 2019-11-09 DIAGNOSIS — F17200 Nicotine dependence, unspecified, uncomplicated: Secondary | ICD-10-CM | POA: Diagnosis not present

## 2019-11-09 DIAGNOSIS — N390 Urinary tract infection, site not specified: Secondary | ICD-10-CM | POA: Diagnosis present

## 2019-11-09 DIAGNOSIS — E1169 Type 2 diabetes mellitus with other specified complication: Secondary | ICD-10-CM | POA: Diagnosis not present

## 2019-11-09 DIAGNOSIS — Z7984 Long term (current) use of oral hypoglycemic drugs: Secondary | ICD-10-CM

## 2019-11-09 DIAGNOSIS — E1142 Type 2 diabetes mellitus with diabetic polyneuropathy: Secondary | ICD-10-CM | POA: Diagnosis present

## 2019-11-09 DIAGNOSIS — E1165 Type 2 diabetes mellitus with hyperglycemia: Secondary | ICD-10-CM

## 2019-11-09 DIAGNOSIS — R112 Nausea with vomiting, unspecified: Secondary | ICD-10-CM

## 2019-11-09 DIAGNOSIS — Z7982 Long term (current) use of aspirin: Secondary | ICD-10-CM | POA: Diagnosis not present

## 2019-11-09 DIAGNOSIS — G629 Polyneuropathy, unspecified: Secondary | ICD-10-CM | POA: Diagnosis not present

## 2019-11-09 DIAGNOSIS — Z9119 Patient's noncompliance with other medical treatment and regimen: Secondary | ICD-10-CM

## 2019-11-09 DIAGNOSIS — R52 Pain, unspecified: Secondary | ICD-10-CM | POA: Diagnosis not present

## 2019-11-09 DIAGNOSIS — I639 Cerebral infarction, unspecified: Secondary | ICD-10-CM | POA: Diagnosis present

## 2019-11-09 DIAGNOSIS — I951 Orthostatic hypotension: Secondary | ICD-10-CM | POA: Diagnosis not present

## 2019-11-09 DIAGNOSIS — I63531 Cerebral infarction due to unspecified occlusion or stenosis of right posterior cerebral artery: Secondary | ICD-10-CM | POA: Diagnosis present

## 2019-11-09 DIAGNOSIS — M25512 Pain in left shoulder: Secondary | ICD-10-CM | POA: Diagnosis not present

## 2019-11-09 DIAGNOSIS — E669 Obesity, unspecified: Secondary | ICD-10-CM | POA: Diagnosis not present

## 2019-11-09 LAB — GLUCOSE, CAPILLARY
Glucose-Capillary: 169 mg/dL — ABNORMAL HIGH (ref 70–99)
Glucose-Capillary: 162 mg/dL — ABNORMAL HIGH (ref 70–99)
Glucose-Capillary: 138 mg/dL — ABNORMAL HIGH (ref 70–99)
Glucose-Capillary: 226 mg/dL — ABNORMAL HIGH (ref 70–99)

## 2019-11-09 MED ORDER — ENOXAPARIN SODIUM 40 MG/0.4ML ~~LOC~~ SOLN
40.0000 mg | Freq: Every day | SUBCUTANEOUS | Status: AC
  Administered 2019-11-10 – 2019-12-02 (×23): 40 mg via SUBCUTANEOUS
  Filled 2019-11-09 (×23): qty 0.4

## 2019-11-09 MED ORDER — PREGABALIN 25 MG PO CAPS
25.0000 mg | ORAL_CAPSULE | Freq: Every evening | ORAL | Status: DC
Start: 2019-11-09 — End: 2019-12-03

## 2019-11-09 MED ORDER — ATORVASTATIN CALCIUM 80 MG PO TABS
80.0000 mg | ORAL_TABLET | Freq: Every day | ORAL | Status: DC
  Administered 2019-11-10 – 2019-12-02 (×23): 80 mg via ORAL
  Filled 2019-11-09 (×23): qty 1

## 2019-11-09 MED ORDER — ONDANSETRON HCL 4 MG/2ML IJ SOLN
4.0000 mg | Freq: Four times a day (QID) | INTRAMUSCULAR | Status: DC | PRN
  Administered 2019-11-10: 4 mg via INTRAVENOUS
  Filled 2019-11-09 (×2): qty 2

## 2019-11-09 MED ORDER — CARVEDILOL 6.25 MG PO TABS
6.2500 mg | ORAL_TABLET | Freq: Two times a day (BID) | ORAL | Status: DC
  Administered 2019-11-09: 6.25 mg via ORAL
  Filled 2019-11-09: qty 1

## 2019-11-09 MED ORDER — CHLORHEXIDINE GLUCONATE CLOTH 2 % EX PADS
6.0000 | MEDICATED_PAD | Freq: Every day | CUTANEOUS | Status: DC
  Administered 2019-11-09: 6 via TOPICAL

## 2019-11-09 MED ORDER — CLOPIDOGREL BISULFATE 75 MG PO TABS
75.0000 mg | ORAL_TABLET | Freq: Every day | ORAL | Status: DC
  Administered 2019-11-10 – 2019-12-01 (×22): 75 mg via ORAL
  Filled 2019-11-09 (×23): qty 1

## 2019-11-09 MED ORDER — INSULIN ASPART 100 UNIT/ML ~~LOC~~ SOLN
0.0000 [IU] | Freq: Three times a day (TID) | SUBCUTANEOUS | Status: DC
  Administered 2019-11-09: 5 [IU] via SUBCUTANEOUS
  Administered 2019-11-10: 3 [IU] via SUBCUTANEOUS
  Administered 2019-11-10: 5 [IU] via SUBCUTANEOUS
  Administered 2019-11-10: 2 [IU] via SUBCUTANEOUS
  Administered 2019-11-10: 3 [IU] via SUBCUTANEOUS
  Administered 2019-11-11 (×2): 2 [IU] via SUBCUTANEOUS
  Administered 2019-11-11 (×2): 3 [IU] via SUBCUTANEOUS
  Administered 2019-11-12: 5 [IU] via SUBCUTANEOUS
  Administered 2019-11-12: 2 [IU] via SUBCUTANEOUS
  Administered 2019-11-12: 3 [IU] via SUBCUTANEOUS
  Administered 2019-11-13: 1 [IU] via SUBCUTANEOUS
  Administered 2019-11-13 (×4): 3 [IU] via SUBCUTANEOUS
  Administered 2019-11-14: 5 [IU] via SUBCUTANEOUS
  Administered 2019-11-14: 8 [IU] via SUBCUTANEOUS
  Administered 2019-11-14: 3 [IU] via SUBCUTANEOUS
  Administered 2019-11-14: 2 [IU] via SUBCUTANEOUS
  Administered 2019-11-15: 5 [IU] via SUBCUTANEOUS
  Administered 2019-11-15: 2 [IU] via SUBCUTANEOUS
  Administered 2019-11-15: 5 [IU] via SUBCUTANEOUS
  Administered 2019-11-15 – 2019-11-16 (×4): 3 [IU] via SUBCUTANEOUS
  Administered 2019-11-16: 5 [IU] via SUBCUTANEOUS
  Administered 2019-11-17: 2 [IU] via SUBCUTANEOUS
  Administered 2019-11-17 (×2): 5 [IU] via SUBCUTANEOUS
  Administered 2019-11-17: 3 [IU] via SUBCUTANEOUS
  Administered 2019-11-18 (×3): 2 [IU] via SUBCUTANEOUS
  Administered 2019-11-19 (×2): 3 [IU] via SUBCUTANEOUS
  Administered 2019-11-19 – 2019-11-20 (×3): 2 [IU] via SUBCUTANEOUS
  Administered 2019-11-21 (×2): 3 [IU] via SUBCUTANEOUS
  Administered 2019-11-22: 2 [IU] via SUBCUTANEOUS
  Administered 2019-11-22 (×3): 3 [IU] via SUBCUTANEOUS
  Administered 2019-11-23 (×4): 2 [IU] via SUBCUTANEOUS
  Administered 2019-11-24: 3 [IU] via SUBCUTANEOUS
  Administered 2019-11-24: 2 [IU] via SUBCUTANEOUS
  Administered 2019-11-24: 5 [IU] via SUBCUTANEOUS
  Administered 2019-11-25: 2 [IU] via SUBCUTANEOUS
  Administered 2019-11-25: 5 [IU] via SUBCUTANEOUS
  Administered 2019-11-25 – 2019-11-26 (×4): 2 [IU] via SUBCUTANEOUS
  Administered 2019-11-26: 3 [IU] via SUBCUTANEOUS
  Administered 2019-11-27 (×2): 2 [IU] via SUBCUTANEOUS
  Administered 2019-11-27: 3 [IU] via SUBCUTANEOUS
  Administered 2019-11-28: 5 [IU] via SUBCUTANEOUS
  Administered 2019-11-28 – 2019-11-29 (×2): 2 [IU] via SUBCUTANEOUS
  Administered 2019-11-29: 3 [IU] via SUBCUTANEOUS
  Administered 2019-11-29 (×2): 2 [IU] via SUBCUTANEOUS
  Administered 2019-12-01 (×2): 3 [IU] via SUBCUTANEOUS
  Administered 2019-12-01: 2 [IU] via SUBCUTANEOUS
  Administered 2019-12-01: 3 [IU] via SUBCUTANEOUS
  Administered 2019-12-02 (×2): 2 [IU] via SUBCUTANEOUS

## 2019-11-09 MED ORDER — ACETAMINOPHEN 650 MG RE SUPP: 650.0000 mg | Freq: Four times a day (QID) | RECTAL | Status: DC | PRN

## 2019-11-09 MED ORDER — POLYETHYLENE GLYCOL 3350 17 G PO PACK
17.0000 g | PACK | Freq: Every day | ORAL | Status: DC | PRN
  Administered 2019-11-10 – 2019-11-18 (×3): 17 g via ORAL
  Filled 2019-11-09 (×3): qty 1

## 2019-11-09 MED ORDER — LIDOCAINE HCL (PF) 1 % IJ SOLN
INTRAMUSCULAR | Status: AC
  Filled 2019-11-09: qty 5

## 2019-11-09 MED ORDER — INSULIN ASPART 100 UNIT/ML ~~LOC~~ SOLN
SUBCUTANEOUS | 11 refills | Status: DC
Start: 2019-11-09 — End: 2019-12-05

## 2019-11-09 MED ORDER — PREGABALIN 25 MG PO CAPS
25.0000 mg | ORAL_CAPSULE | Freq: Every evening | ORAL | Status: DC
  Administered 2019-11-10 – 2019-11-13 (×4): 25 mg via ORAL
  Filled 2019-11-09 (×4): qty 1

## 2019-11-09 MED ORDER — ATORVASTATIN CALCIUM 80 MG PO TABS
80.0000 mg | ORAL_TABLET | Freq: Every day | ORAL | Status: DC
Start: 2019-11-10 — End: 2019-12-05

## 2019-11-09 MED ORDER — ENOXAPARIN SODIUM 40 MG/0.4ML ~~LOC~~ SOLN: 40.0000 mg | SUBCUTANEOUS | Status: DC

## 2019-11-09 MED ORDER — ONDANSETRON HCL 4 MG PO TABS
4.0000 mg | ORAL_TABLET | Freq: Four times a day (QID) | ORAL | Status: DC | PRN
  Administered 2019-11-11 – 2019-11-13 (×4): 4 mg via ORAL
  Filled 2019-11-09 (×4): qty 1

## 2019-11-09 MED ORDER — CARVEDILOL 12.5 MG PO TABS
12.5000 mg | ORAL_TABLET | Freq: Two times a day (BID) | ORAL | Status: DC
  Administered 2019-11-09: 12.5 mg via ORAL
  Filled 2019-11-09: qty 1

## 2019-11-09 MED ORDER — CLOPIDOGREL BISULFATE 75 MG PO TABS
75.0000 mg | ORAL_TABLET | Freq: Every day | ORAL | Status: DC
Start: 2019-11-10 — End: 2019-12-05

## 2019-11-09 MED ORDER — CARVEDILOL 6.25 MG PO TABS
6.2500 mg | ORAL_TABLET | Freq: Two times a day (BID) | ORAL | Status: DC
Start: 2019-11-10 — End: 2019-12-03

## 2019-11-09 MED ORDER — ASPIRIN EC 81 MG PO TBEC
81.0000 mg | DELAYED_RELEASE_TABLET | Freq: Every day | ORAL | Status: DC
  Administered 2019-11-10 – 2019-12-02 (×23): 81 mg via ORAL
  Filled 2019-11-09 (×23): qty 1

## 2019-11-09 MED ORDER — ACETAMINOPHEN 325 MG PO TABS
650.0000 mg | ORAL_TABLET | Freq: Four times a day (QID) | ORAL | Status: DC | PRN
  Administered 2019-11-13 – 2019-11-29 (×18): 650 mg via ORAL
  Filled 2019-11-09 (×21): qty 2

## 2019-11-09 MED ORDER — ENSURE ENLIVE PO LIQD: 237.0000 mL | Freq: Two times a day (BID) | ORAL | Status: DC

## 2019-11-09 NOTE — Progress Notes (Signed)
STROKE TEAM PROGRESS NOTE   INTERVAL HISTORY Her left-sided peripheral vision loss persists.  No new complaints.  Neurological exam is unchanged.  She has elective left carotid endarterectomy scheduled with vascular surgery at the end of November.  Vital signs are stable.  Vitals:   11/08/19 2100 11/09/19 0013 11/09/19 0430 11/09/19 0734  BP: (!) 157/57 (!) 187/63 (!) 179/60 (!) 135/53  Pulse: 64 65 69 73  Resp: 18 20 20 18   Temp: 98.5 F (36.9 C) 98.5 F (36.9 C) 98.3 F (36.8 C) 98.4 F (36.9 C)  TempSrc: Oral Oral Oral Oral  SpO2: 96%   94%   CBC:  Recent Labs  Lab 11/07/19 0339 11/08/19 0406  WBC 11.1* 12.7*  NEUTROABS 8.6* 8.9*  HGB 12.2 13.0  HCT 36.3 38.6  MCV 93.1 92.6  PLT 299 311   Basic Metabolic Panel:  Recent Labs  Lab 11/07/19 0339 11/08/19 0406  NA 132* 137  K 3.9 3.5  CL 98 103  CO2 27 25  GLUCOSE 260* 165*  BUN 15 14  CREATININE 0.82 1.04*  CALCIUM 8.9 9.2   Lipid Panel:  Recent Labs  Lab 11/08/19 0406  CHOL 173  TRIG 82  HDL 36*  CHOLHDL 4.8  VLDL 16  LDLCALC 11/10/19*   HgbA1c:  Recent Labs  Lab 11/07/19 0339  HGBA1C 10.1*   Urine Drug Screen: No results for input(s): LABOPIA, COCAINSCRNUR, LABBENZ, AMPHETMU, THCU, LABBARB in the last 168 hours.  Alcohol Level No results for input(s): ETH in the last 168 hours.  IMAGING past 24 hours No results found.  PHYSICAL EXAM   Pleasant elderly lady not in distress. . Afebrile. Head is nontraumatic. Neck is supple without bruit.    Cardiac exam no murmur or gallop. Lungs are clear to auscultation. Distal pulses are well felt. Neurological Exam :  Awake alert oriented to time place and person.  Extraocular movements are full range except slight saccadic dysmetria to the left.  Left partial homonymous hemianopsia.  Mild left lower facial asymmetry.  Tongue midline.  Motor system exam no upper or lower extremity drift but diminished fine finger movements on the left and orbits right over left  upper extremity.  Mild left grip weakness.  Slight left finger-to-nose and knee to heel incoordination.  Sensation slightly diminished on the left compared to the right.  Gait not tested.   ASSESSMENT/PLAN [name] is a 74 y.o. female with history of of stroke, hypertension, diabetes, cardiac arrest presenting with hypertensive urgency and hyperglycemia. Found to have L eye field cut. Known L ICA stenosis w/ planned CEA late Nov once BP under control..   Stroke:   R PCA infarct in setting of left vertebral artery occlusion along with severe L ICA stenosis with previously silent subacute left parietal infarct secondary to large vessel disease    CT head No acute abnormality. Small vessel disease. Atrophy. Small chronic L parietal and R caudate infarcts.   CTA head & neck R medial temporal lobe hypodensity. L VA origin occlusion. Proximal L ICA 80% stenosis. R ICA atherosclerosis. B PCA diffuse intracranial atherosclerosis.  MRI  R hippocampus infarct. Mild small vessel disease. L parietal chronic microhemorrhages.  Carotid Doppler  11/02/2019 L ICA 80-99% stenosis   2D Echo bubble  EF 60-65%. No source of embolus. LA mildly dilated. Neg bubble  LDL 121  HgbA1c 10.1  VTE prophylaxis - Lovenox 40 mg sq daily   aspirin 81 mg daily prior to admission, now  on aspirin 81 mg and Plavix 75 daily into 3 months.  Followed by Plavix alone  Therapy recommendations:  CIR  Disposition:  pending   Patient advised she cannot drive  Carotid Stenosis   Carotid Doppler  11/02/2019 L ICA 80-99% stenosis   CTA neck Proximal L ICA 80% stenosis.   Planned L CEA by Early late Nov once BP controlled  Ok to continue surgery time frame  Hypertensive Urgency  BP as high as 222/90 on admission  . Permissive hypertension (OK if < 220/120) but gradually normalize in 5-7 days . SBP goal 130-150 given ICA stenosis   Hyperlipidemia  Home meds:  No statin  LDL 121, goal < 70  Now on  lipitor 80  Continue statin at discharge  Diabetes type II Uncontrolled  HgbA1c 10.1, goal < 7.0  CBGs Recent Labs    11/08/19 1633 11/08/19 2112 11/09/19 0713  GLUCAP 157* 132* 169*      SSI  Other Stroke Risk Factors  Advanced age  Cigarette smoker, advised to stop smoking  Hx stroke/TIA - etiology unclear. Resultant R hemiparesis  Family hx stroke (father)  Other Active Problems  Hx cardiac arrest per pt   Nausea/vomiting, ongoing heaves w/ position change  Hx postherpetic neuralgia on neurontin  Anxiety  Acute Urinary retention. Foley placed by GU 10/29.   Hospital day # 2 Recommend aspirin and Plavix for 3 months followed by Plavix alone.  Elective left carotid endarterectomy at the end of November as previously scheduled with vascular surgery.  Aggressive risk factor modification.  Long discussion with patient and family member at the bedside and answered questions.  Greater than 50% time during this 25-minute visit was spent on counseling and coordination of care and answered questions.  Discussed with Dr.Ghimire. Stroke team will sign off. Delia Heady, MD  To contact Stroke Continuity provider, please refer to [email or web address]. After hours, contact General Neurology

## 2019-11-09 NOTE — H&P (Signed)
Physical Medicine and Rehabilitation Admission H&P    Chief Complaint  Patient presents with  . Hypertension  . Hyperglycemia  : HPI: Chelsea [name]. Deal is a 74 year old right-handed female with history of hypertension, diabetes mellitus, left carotid stenosis 80% was scheduled for carotid enterectomy in late November with Dr. Arbie Cookey, tobacco abuse,  medical noncompliance.  Per chart review independent with assistive device prior to admission.  1 level home 4 steps to entry.  She lives with her children and assistance as needed.  Presented 11/07/2019 with intractable nausea and vomiting as well as loss of vision left peripheral field.  Noted blood pressure 216/79.  Patient did have a CT of the head 10/30/2019 after a fall showing no evidence of acute changes.  MRI showed acute versus subacute infarct in the right hyper campus without hemorrhage.  Mild chronic ischemic changes.  CT angiogram of the head and neck ill-defined hypodensity right medial temporal lobe progressed since recent CT of 10/30/2019 most consistent with subacute infarction.  Occlusion left vertebral artery at the origin 80% focal noncalcified stenosis proximal left ICA artery.  Admission chemistries unremarkable except glucose 345 BUN 26 urine culture multiple species troponin negative hemoglobin A1c 10.1.  Echocardiogram with ejection fraction of 60 to 65% no wall motion abnormalities grade 1 diastolic dysfunction.  Maintained on aspirin and Plavix for CVA prophylaxis x3 months then Plavix alone.  Subcutaneous Lovenox for DVT prophylaxis.  Hospital course urinary retention urology consulted Foley catheter tube was placed 11/09/2019 with plans for a voiding trial as patient's mobility improved.  Tolerating a regular diet.  Therapy evaluations completed and patient was admitted for a comprehensive rehab program. Please see preadmission and consult from earlier today as well.    Review of Systems  Constitutional: Positive for  malaise/fatigue. Negative for chills and fever.  HENT: Negative for hearing loss.   Eyes: Positive for blurred vision.  Respiratory: Negative for cough and shortness of breath.   Cardiovascular: Negative for chest pain, palpitations and leg swelling.  Gastrointestinal: Positive for nausea and vomiting.  Genitourinary: Negative for dysuria, flank pain and hematuria.  Musculoskeletal: Positive for falls and myalgias.  Skin: Negative for rash.  Neurological: Positive for focal weakness and weakness.  All other systems reviewed and are negative.  Past Medical History:  Diagnosis Date  . Blind left eye   . Cardiac arrest (HCC)   . Diabetes mellitus without complication (HCC)   . Hypertension   . Stroke [facility])    History reviewed. No pertinent surgical history. Family History  Problem Relation Age of Onset  . Heart attack Mother   . Stroke Father    Social History:  reports that she has been smoking. She has never used smokeless tobacco. She reports previous alcohol use. She reports that she does not use drugs. Allergies:  Allergies  Allergen Reactions  . Codeine Anaphylaxis and Swelling    Throat swells    . Penicillins Anaphylaxis and Swelling    Throat closes    . Sulfa Antibiotics Anaphylaxis, Swelling and Other (See Comments)    Lips and eye swell   . Benzodiazepines Hives  . Lidocaine Hives  . Gabapentin Nausea And Vomiting   Medications Prior to Admission  Medication Sig Dispense Refill  . ascorbic acid (VITAMIN C) 500 MG tablet Take 500 mg by mouth daily.    Marland Kitchen aspirin EC 81 MG tablet Take 81 mg by mouth daily. Swallow whole.    . Calcium-Magnesium-Zinc (CAL-MAG-ZINC PO) Take 1 tablet  by mouth daily.    . Multiple Vitamin (MULTI-VITAMIN DAILY PO) Take by mouth.    . hydrochlorothiazide (HYDRODIURIL) 25 MG tablet Take 1 tablet (25 mg total) by mouth daily. 30 tablet 1  . metFORMIN (GLUCOPHAGE) 500 MG tablet Take 1 tablet (500 mg total) by mouth 2 (two) times daily  with a meal. (Patient not taking: Reported on 11/05/2019) 60 tablet 1    Drug Regimen Review Drug regimen was reviewed and remains appropriate with no significant issues identified  Home: Home Living Family/patient expects to be discharged to:: Private residence Living Arrangements: Children Available Help at Discharge: Family, Available 24 hours/day Type of Home: House Home Access: Stairs to enter Entergy CorporationEntrance Stairs-Number of Steps: 4 Entrance Stairs-Rails: Can reach both Home Layout: One level Bathroom Shower/Tub: Engineer, manufacturing systemsTub/shower unit Bathroom Toilet: Handicapped height Home Equipment: Information systems managerhower seat, Environmental consultantWalker - 2 wheels, Other (comment) Additional Comments: has a friend installing rails throughout bathroom; has adjustable bed; lift chair   Functional History: Prior Function Level of Independence: Independent with assistive device(s) Comments: was independent without AD until last week- then began using RW  Functional Status:  Mobility: Bed Mobility Overal bed mobility: Needs Assistance Bed Mobility: Rolling, Sidelying to Sit, Sit to Supine Rolling: Mod assist Sidelying to sit: Mod assist, +2 for safety/equipment Supine to sit: Mod assist Sit to supine: Max assist, +2 for physical assistance, +2 for safety/equipment, HOB elevated General bed mobility comments: rolling bilaterally requiring mod assist for trunk and LE translation, increased time to perform secondary to pt dizziness. Mod-max+2 for supine<>sit for trunk and LE management, scooting to and from EOB, and boost up in bed with use of bed pads. Step-by-step sequencing required. Transfers Overall transfer level: Needs assistance Equipment used: 1 person hand held assist Transfers: Sit to/from Stand Sit to Stand: Min assist General transfer comment: unable to attempt 2/2 n/v and fatigue/lethargy      ADL: ADL Overall ADL's : Needs assistance/impaired Eating/Feeding: Set up, Sitting Grooming: Set up, Sitting Upper Body  Bathing: Minimal assistance, Sitting Lower Body Bathing: Minimal assistance, Sit to/from stand, Sitting/lateral leans Upper Body Dressing : Set up, Sitting Lower Body Dressing: Minimal assistance, Sit to/from stand, Sitting/lateral leans Toilet Transfer: Minimal assistance, Stand-pivot, BSC, RW Toileting- Clothing Manipulation and Hygiene: Minimal assistance, Sit to/from stand Functional mobility during ADLs: Minimal assistance, Moderate assistance, Rolling walker, Cueing for sequencing, Cueing for safety General ADL Comments: Bed mobility and sat EOB several minutes close min guard level-light steadying A. Onset of n/v sitting EOB.   Cognition: Cognition Overall Cognitive Status: Impaired/Different from baseline Orientation Level: Oriented X4 Cognition Arousal/Alertness: Lethargic Behavior During Therapy: Anxious, WFL for tasks assessed/performed Overall Cognitive Status: Impaired/Different from baseline Area of Impairment: Safety/judgement Safety/Judgement: Decreased awareness of safety, Decreased awareness of deficits General Comments: more alert sitting EOB, initially drowsy and stating "I feel too weak (to get up)". Pt with L inattention sitting EOB, able to track towards L when cued.  Physical Exam: Blood pressure (!) 135/53, pulse 73, temperature 98.4 F (36.9 C), temperature source Oral, resp. rate 18, SpO2 94 %. Constitutional:      General: She is not in acute distress.    Appearance: She is normal weight.  HENT:     Head: Normocephalic and atraumatic.     Right Ear: External ear normal.     Left Ear: External ear normal.     Nose: Nose normal.  Eyes:     General:        Right eye: No discharge.  Left eye: No discharge.     Extraocular Movements: Extraocular movements intact.  Cardiovascular:     Rate and Rhythm: Normal rate and regular rhythm.  Pulmonary:     Effort: Pulmonary effort is normal. No respiratory distress.     Breath sounds: No stridor.    Abdominal:     General: Abdomen is flat. Bowel sounds are normal. There is no distension.  Musculoskeletal:     Cervical back: Normal range of motion and neck supple.     Comments: No edema or tenderness in extremities  Skin:    General: Skin is warm and dry.  Neurological:     Mental Status: She is alert.     Comments: Alert Provides name and age.   Follows simple commands. Sensation diminished light touch bilateral feet Hyperalgesia left shoulder Motor: Bilateral upper extremities: 5/5 proximal distal Bilateral lower extremities: Hip flexion 4 -/5, knee extension 4/5, ankle dorsiflexion 5/5 (right weaker than left) Left neglect  Psychiatric:        Mood and Affect: Affect is blunt.        Speech: Speech is delayed.   Results for orders placed or performed during the hospital encounter of 11/07/19 (from the past 48 hour(s))  CBG monitoring, ED     Status: None   Collection Time: 11/07/19 12:36 PM  Result Value Ref Range   Glucose-Capillary 88 70 - 99 mg/dL    Comment: Glucose reference range applies only to samples taken after fasting for at least 8 hours.  CBG monitoring, ED     Status: Abnormal   Collection Time: 11/07/19  3:28 PM  Result Value Ref Range   Glucose-Capillary 163 (H) 70 - 99 mg/dL    Comment: Glucose reference range applies only to samples taken after fasting for at least 8 hours.  Glucose, capillary     Status: Abnormal   Collection Time: 11/07/19  4:56 PM  Result Value Ref Range   Glucose-Capillary 215 (H) 70 - 99 mg/dL    Comment: Glucose reference range applies only to samples taken after fasting for at least 8 hours.  Troponin I (High Sensitivity)     Status: None   Collection Time: 11/07/19  5:23 PM  Result Value Ref Range   Troponin I (High Sensitivity) 8 <18 ng/L    Comment: (NOTE) Elevated high sensitivity troponin I (hsTnI) values and significant  changes across serial measurements may suggest ACS but many other  chronic and acute conditions  are known to elevate hsTnI results.  Refer to the "Links" section for chest pain algorithms and additional  guidance. Performed at [facility], [address] [place], [place] [address]   Glucose, capillary     Status: Abnormal   Collection Time: 11/07/19  9:25 PM  Result Value Ref Range   Glucose-Capillary 137 (H) 70 - 99 mg/dL    Comment: Glucose reference range applies only to samples taken after fasting for at least 8 hours.   Comment 1 Notify RN    Comment 2 Document in Chart   Comprehensive metabolic panel     Status: Abnormal   Collection Time: 11/08/19  4:06 AM  Result Value Ref Range   Sodium 137 135 - 145 mmol/L   Potassium 3.5 3.5 - 5.1 mmol/L   Chloride 103 98 - 111 mmol/L   CO2 25 22 - 32 mmol/L   Glucose, Bld 165 (H) 70 - 99 mg/dL    Comment: Glucose reference range applies only to  samples taken after fasting for at least 8 hours.   BUN 14 8 - 23 mg/dL   Creatinine, Ser 4.16 (H) 0.44 - 1.00 mg/dL   Calcium 9.2 8.9 - 60.6 mg/dL   Total Protein 5.8 (L) 6.5 - 8.1 g/dL   Albumin 3.3 (L) 3.5 - 5.0 g/dL   AST 17 15 - 41 U/L   ALT 14 0 - 44 U/L   Alkaline Phosphatase 73 38 - 126 U/L   Total Bilirubin 0.8 0.3 - 1.2 mg/dL   GFR, Estimated 56 (L) >60 mL/min    Comment: (NOTE) Calculated using the CKD-EPI Creatinine Equation (2021)    Anion gap 9 5 - 15    Comment: Performed at [facility], [address], [place], [place] [address]  Protime-INR     Status: None   Collection Time: 11/08/19  4:06 AM  Result Value Ref Range   Prothrombin Time 13.1 11.4 - 15.2 seconds   INR 1.0 0.8 - 1.2    Comment: (NOTE) INR goal varies based on device and disease states. Performed at [facility], [address] [place], [place] [address]   APTT     Status: None   Collection Time: 11/08/19  4:06 AM  Result Value Ref Range   aPTT 32 24 - 36 seconds    Comment: Performed at [facility], [address], [place], [place] [address]  CBC WITH DIFFERENTIAL      Status: Abnormal   Collection Time: 11/08/19  4:06 AM  Result Value Ref Range   WBC 12.7 (H) 4.0 - 10.5 K/uL   RBC 4.17 3.87 - 5.11 MIL/uL   Hemoglobin 13.0 12.0 - 15.0 g/dL   HCT 22.0 36 - 46 %   MCV 92.6 80.0 - 100.0 fL   MCH 31.2 26.0 - 34.0 pg   MCHC 33.7 30.0 - 36.0 g/dL   RDW 25.4 27.0 - 62.3 %   Platelets 311 150 - 400 K/uL   nRBC 0.0 0.0 - 0.2 %   Neutrophils Relative % 70 %   Neutro Abs 8.9 (H) 1.7 - 7.7 K/uL   Lymphocytes Relative 23 %   Lymphs Abs 3.0 0.7 - 4.0 K/uL   Monocytes Relative 6 %   Monocytes Absolute 0.8 0.1 - 1.0 K/uL   Eosinophils Relative 0 %   Eosinophils Absolute 0.1 0.0 - 0.5 K/uL   Basophils Relative 1 %   Basophils Absolute 0.1 0.0 - 0.1 K/uL   Immature Granulocytes 0 %   Abs Immature Granulocytes 0.03 0.00 - 0.07 K/uL    Comment: Performed at [facility], [address], [place], [place] [address]  Lipid panel     Status: Abnormal   Collection Time: 11/08/19  4:06 AM  Result Value Ref Range   Cholesterol 173 0 - 200 mg/dL   Triglycerides 82 <151 mg/dL   HDL 36 (L) >76 mg/dL   Total CHOL/HDL Ratio 4.8 RATIO   VLDL 16 0 - 40 mg/dL   LDL Cholesterol 160 (H) 0 - 99 mg/dL    Comment:        Total Cholesterol/HDL:CHD Risk Coronary Heart Disease Risk Table                     Men   Women  1/2 Average Risk   3.4   3.3  Average Risk       5.0   4.4  2 X Average Risk  9.6   7.1  3 X Average Risk  23.4   11.0        Use the calculated Patient Ratio above and the CHD Risk Table to determine the patient's CHD Risk.        ATP III CLASSIFICATION (LDL):  <100     mg/dL   Optimal  350-093  mg/dL   Near or Above                    Optimal  130-159  mg/dL   Borderline  818-299  mg/dL   High  >371     mg/dL   Very High Performed at [facility], [address] [place], [place] [address]   Glucose, capillary     Status: Abnormal   Collection Time: 11/08/19  6:43 AM  Result Value Ref Range   Glucose-Capillary 124 (H) 70 - 99  mg/dL    Comment: Glucose reference range applies only to samples taken after fasting for at least 8 hours.   Comment 1 Notify RN    Comment 2 Document in Chart   Glucose, capillary     Status: Abnormal   Collection Time: 11/08/19 12:39 PM  Result Value Ref Range   Glucose-Capillary 158 (H) 70 - 99 mg/dL    Comment: Glucose reference range applies only to samples taken after fasting for at least 8 hours.  Glucose, capillary     Status: Abnormal   Collection Time: 11/08/19  4:33 PM  Result Value Ref Range   Glucose-Capillary 157 (H) 70 - 99 mg/dL    Comment: Glucose reference range applies only to samples taken after fasting for at least 8 hours.  Glucose, capillary     Status: Abnormal   Collection Time: 11/08/19  9:12 PM  Result Value Ref Range   Glucose-Capillary 132 (H) 70 - 99 mg/dL    Comment: Glucose reference range applies only to samples taken after fasting for at least 8 hours.  Glucose, capillary     Status: Abnormal   Collection Time: 11/09/19  7:13 AM  Result Value Ref Range   Glucose-Capillary 169 (H) 70 - 99 mg/dL    Comment: Glucose reference range applies only to samples taken after fasting for at least 8 hours.   ECHOCARDIOGRAM COMPLETE BUBBLE STUDY  Result Date: 11/07/2019    ECHOCARDIOGRAM REPORT   Patient Name:   Chelsea [name] Antillon Date of Exam: 11/07/2019 Medical Rec #:  [id number]      Height:       67.0 in Accession #:    [id number]     Weight:       141.1 lb Date of Birth:  1945-03-15      BSA:          1.744 m Patient Age:    74 years       BP:           199/78 mmHg Patient Gender: F              HR:           59 bpm. Exam Location:  Inpatient Procedure: 2D Echo, Cardiac Doppler, Color Doppler and Saline Contrast Bubble            Study Indications:    Stroke 434.91 / I163.9  History:        Patient has no prior history of Echocardiogram examinations.  Risk Factors:Hypertension and Diabetes. Cardiac Arrest.  Sonographer:    Tiffany Dance Referring Phys:  [id number] Deno Lunger [place] IMPRESSIONS  1. Left ventricular ejection fraction, by estimation, is 60 to 65%. The left ventricle has normal function. The left ventricle has no regional wall motion abnormalities. There is mild asymmetric left ventricular hypertrophy of the basal-septal segment. Left ventricular diastolic parameters are consistent with Grade I diastolic dysfunction (impaired relaxation). Elevated left ventricular end-diastolic pressure.  2. Right ventricular systolic function is normal. The right ventricular size is normal.  3. Left atrial size was moderately dilated.  4. The mitral valve is normal in structure. Trivial mitral valve regurgitation. No evidence of mitral stenosis. Moderate mitral annular calcification.  5. The aortic valve is tricuspid. There is mild calcification of the aortic valve. There is mild thickening of the aortic valve. Aortic valve regurgitation is not visualized. Mild aortic valve sclerosis is present, with no evidence of aortic valve stenosis.  6. The inferior vena cava is normal in size with greater than 50% respiratory variability, suggesting right atrial pressure of 3 mmHg.  7. Agitated saline contrast bubble study was negative, with no evidence of any interatrial shunt. Comparison(s): No prior Echocardiogram. Conclusion(s)/Recommendation(s): Normal biventricular function without evidence of hemodynamically significant valvular heart disease. No intracardiac source of embolism detected on this transthoracic study. A transesophageal echocardiogram is recommended to exclude cardiac source of embolism if clinically indicated. FINDINGS  Left Ventricle: Left ventricular ejection fraction, by estimation, is 60 to 65%. The left ventricle has normal function. The left ventricle has no regional wall motion abnormalities. The left ventricular internal cavity size was normal in size. There is  mild asymmetric left ventricular hypertrophy of the basal-septal segment. Left ventricular  diastolic parameters are consistent with Grade I diastolic dysfunction (impaired relaxation). Elevated left ventricular end-diastolic pressure. Right Ventricle: The right ventricular size is normal. No increase in right ventricular wall thickness. Right ventricular systolic function is normal. Left Atrium: Left atrial size was moderately dilated. Right Atrium: Right atrial size was normal in size. Pericardium: There is no evidence of pericardial effusion. Mitral Valve: The mitral valve is normal in structure. Moderate mitral annular calcification. Trivial mitral valve regurgitation. No evidence of mitral valve stenosis. Tricuspid Valve: The tricuspid valve is normal in structure. Tricuspid valve regurgitation is trivial. No evidence of tricuspid stenosis. Aortic Valve: The aortic valve is tricuspid. There is mild calcification of the aortic valve. There is mild thickening of the aortic valve. Aortic valve regurgitation is not visualized. Mild aortic valve sclerosis is present, with no evidence of aortic valve stenosis. Pulmonic Valve: The pulmonic valve was not well visualized. Pulmonic valve regurgitation is mild. No evidence of pulmonic stenosis. Aorta: The aortic root, ascending aorta and aortic arch are all structurally normal, with no evidence of dilitation or obstruction. Venous: The inferior vena cava is normal in size with greater than 50% respiratory variability, suggesting right atrial pressure of 3 mmHg. IAS/Shunts: No atrial level shunt detected by color flow Doppler. Agitated saline contrast was given intravenously to evaluate for intracardiac shunting. Agitated saline contrast bubble study was negative, with no evidence of any interatrial shunt.  LEFT VENTRICLE PLAX 2D LVIDd:         4.49 cm  Diastology LVIDs:         3.36 cm  LV e' medial:    3.92 cm/s LV PW:         0.97 cm  LV E/e' medial:  17.0 LV IVS:  1.41 cm  LV e' lateral:   4.90 cm/s LVOT diam:     2.30 cm  LV E/e' lateral: 13.6 LV SV:          107 LV SV Index:   61 LVOT Area:     4.15 cm  RIGHT VENTRICLE             IVC RV Basal diam:  2.89 cm     IVC diam: 1.60 cm RV S prime:     12.50 cm/s TAPSE (M-mode): 2.6 cm LEFT ATRIUM             Index       RIGHT ATRIUM           Index LA diam:        3.90 cm 2.24 cm/m  RA Area:     14.80 cm LA Vol (A2C):   81.5 ml 46.74 ml/m RA Volume:   38.20 ml  21.91 ml/m LA Vol (A4C):   60.2 ml 34.53 ml/m LA Biplane Vol: 70.3 ml 40.32 ml/m  AORTIC VALVE LVOT Vmax:   94.45 cm/s LVOT Vmean:  64.600 cm/s LVOT VTI:    0.258 m  AORTA Ao Root diam: 3.70 cm Ao Asc diam:  3.40 cm MITRAL VALVE MV Area (PHT): 1.87 cm     SHUNTS MV Decel Time: 405 msec     Systemic VTI:  0.26 m MV E velocity: 66.50 cm/s   Systemic Diam: 2.30 cm MV A velocity: 114.00 cm/s MV E/A ratio:  0.58 Jodelle Red MD Electronically signed by Jodelle Red MD Signature Date/Time: 11/07/2019/3:25:37 PM    Final        Medical Problem List and Plan: 1.  Decreased functional mobility with falls loss of vision left peripheral field secondary to right PCA infarction in the setting of left vertebral artery occlusion along with severe left ICA stenosis with previously silent subacute left parietal infarct secondary to large vessel disease.  -patient may shower  -ELOS/Goals: 12-16 days/Min/Mod A  Admit to CIR 2.  Antithrombotics: -DVT/anticoagulation: Lovenox  -antiplatelet therapy: Aspirin 81 mg daily and Plavix 25 mg daily x3 months then Plavix alone 3. Pain Management: Lyrica 25 mg daily 4. Mood: Provide emotional support  -antipsychotic agents: N/A 5. Neuropsych: This patient is capable of making decisions on her own behalf. 6. Skin/Wound Care: Routine skin checks 7. Fluids/Electrolytes/Nutrition: Routine in and outs. CMP ordered. 8.  Hypertension.  Coreg 12.5 mg twice daily.  Monitor with increased mobility.  9.  Diabetes mellitus peripheral neuropathy.  Hemoglobin A1c 10.1.  SSI.  Patient on Glucophage 500 mg twice  daily prior to admission.  Resume as needed  Monitor with increased mobulity 10.  Hyperlipidemia.  Lipitor 12.  Tobacco abuse.  Counseling 13.  Medical noncompliance.  Cousnel  Mcarthur Rossetti Angiulli, PA-C 11/09/2019  I have personally performed a face to face diagnostic evaluation, including, but not limited to relevant history and physical exam findings, of this patient and developed relevant assessment and plan.  Additionally, I have reviewed and concur with the physician assistant's documentation above.  Maryla Morrow, MD, ABPMR  The patient's status has not changed. Any changes from the pre-admission screening or documentation from the acute chart are noted above.   Maryla Morrow, MD, ABPMR

## 2019-11-09 NOTE — Discharge Summary (Signed)
PATIENT DETAILS Name: Chelsea [name] Age: 74 y.o. Sex: female Date of Birth: 03/18/1945 MRN: [id number]. Admitting Physician: Jonetta Osgood, MD ERD:[id number], Waldemar Dickens, DO  Admit Date: 11/07/2019 Discharge date: 11/09/2019  Recommendations for Outpatient Follow-up:  1. Follow up with PCP in 1-2 weeks 2. Please obtain CMP/CBC in one week 3. Please perform voiding trial early next week,if not may require urology referral 4. Please ensure follow up with neurology once discharged from CIR 5. ASA/Plavix for 3 months followed by Plavix alone  Admitted From:  Home  Disposition: [place]: No  Equipment/Devices:  None  Discharge Condition: Stable  CODE STATUS: FULL CODE  Diet recommendation:  Diet Order            Diet - low sodium heart healthy           Diet Carb Modified           Diet regular Room service appropriate? Yes; Fluid consistency: Thin  Diet effective now                  Brief Hospital Course: Acute CVA: No new focal deficits-has stabilized over the past few days-evaluated by neurology with recommendations to continue aspirin/Plavix for 3 months-followed by Plavix alone.  Remains on high intensity statin.  Evaluated by rehab services-recommendations are for CIR.  Needs outpatient follow up at the stroke clinic  Uncontrolled HTN: Initially some amount of permissive hypertension was allowed-BP still fluctuating on the higher side-but better controlled-continue Coreg-follow and optimize  Nausea/Vomiting: Probably related to CVA and uncontrolled hypertension-could have gastroparesis due to longstanding diabetes (A1c 10.1)-abdominal exam remains benign-this morning vomiting seems to have resolved-some mild nausea continues-continue supportive care and as needed antiepileptics. Tolerating diet well.  Known left carotid artery stenosis: Has been evaluated by vascular surgery in the outpatient setting with tentative plans for carotid  endarterectomy later this month.  Currently on antiplatelet/statin-plan is to resume follow-up with vascular surgery as previous post discharge.  DM-2: CBG stable-continue with SSI.Resume metformin-in a few days once nausea improves further  History of postherpetic neuralgia: Apparently takes Neurontin (not listed on the home medication list)-however this is listed as an allergy (as she started having nausea/vomiting-but was probably from CVA/uncontrolled hypertension rather than Neurontin).  After discussion with pharmacy-and with patient-we have started Lyrica which she seems to have tolerated well.  Continue to follow and adjust medication dosage accordingly.  Anxiety: Significant anxiety component-patient scared of side effect of medications (has had numerous anaphylaxis in the past per history)-daughter at bedside is also very anxious.  Have provided reassurance-follow-may need as needed benzos if this much of anxiety continues.  However anxiety seems to be better controlled today given improvement in symptoms.  ?  Myoclonic jerks: Apparently this occurred a few days back-none overnight-could be related to CVA-if it involves the thalamic area.  Supportive care continues-electrolytes within normal limits.  Acute urinary retention: Required Foley catheter insertion on 10/28-evaluated by urology-with recommendations to continue Foley catheter and proceed with a voiding trial next week.  Per patient-she has had incontinence for the past several months-reports that she just cannot hold it-and starts dribbling. Please perform a voiding trial at CIR-if unsuccessful-please reconsult Urology  Tobacco abuse: Counseled  Discharge Diagnoses:  Principal Problem:   Intractable nausea and vomiting Active Problems:   Uncontrolled type 2 diabetes mellitus with hyperglycemia, without long-term current use of insulin (HCC)   Hypertensive urgency   Stenosis of left internal carotid artery  Nicotine  dependence, cigarettes, uncomplicated   HZV (herpes zoster virus) post herpetic neuralgia   Weakness of right lower extremity   CVA (cerebral vascular accident) ([facility])   Primary hypertension   Controlled type 2 diabetes mellitus with hyperglycemia ([place])   Postherpetic neuralgia   Discharge Instructions:  Activity:  As tolerated with Full fall precautions use walker/cane & assistance as needed Discharge Instructions    Ambulatory referral to Neurology   Complete by: As directed    An appointment is requested in approximately: 8 weeks   Diet - low sodium heart healthy   Complete by: As directed    Diet Carb Modified   Complete by: As directed    Increase activity slowly   Complete by: As directed      Allergies as of 11/09/2019      Reactions   Codeine Anaphylaxis, Swelling   Throat swells    Penicillins Anaphylaxis, Swelling   Throat closes    Sulfa Antibiotics Anaphylaxis, Swelling, Other (See Comments)   Lips and eye swell   Benzodiazepines Hives   Lidocaine Hives   Gabapentin Nausea And Vomiting      Medication List    STOP taking these medications   hydrochlorothiazide 25 MG tablet Commonly known as: HYDRODIURIL   metFORMIN 500 MG tablet Commonly known as: GLUCOPHAGE     TAKE these medications   ascorbic acid 500 MG tablet Commonly known as: VITAMIN C Take 500 mg by mouth daily.   aspirin EC 81 MG tablet Take 81 mg by mouth daily. Swallow whole.   atorvastatin 80 MG tablet Commonly known as: LIPITOR Take 1 tablet (80 mg total) by mouth daily. Start taking on: November 10, 2019   CAL-MAG-ZINC PO Take 1 tablet by mouth daily.   carvedilol 6.25 MG tablet Commonly known as: COREG Take 1 tablet (6.25 mg total) by mouth 2 (two) times daily with a meal. Start taking on: November 10, 2019   clopidogrel 75 MG tablet Commonly known as: PLAVIX Take 1 tablet (75 mg total) by mouth daily. Start taking on: November 10, 2019   insulin aspart 100 UNIT/ML  injection Commonly known as: novoLOG 0-15 Units, Subcutaneous, 3 times daily before meals & bedtime CBG < 70: Implement Hypoglycemia Standing Orders and refer to Hypoglycemia Standing Orders sidebar report CBG 70 - 120: 0 units CBG 121 - 150: 2 units CBG 151 - 200: 3 units CBG 201 - 250: 5 units CBG 251 - 300: 8 units CBG 301 - 350: 11 units CBG 351 - 400: 15 units CBG > 400: call MD   MULTI-VITAMIN DAILY PO Take by mouth.   pregabalin 25 MG capsule Commonly known as: LYRICA Take 1 capsule (25 mg total) by mouth every evening.       Follow-up Information    Lazoff, Shawn P, DO. Schedule an appointment as soon as possible for a visit in 1 week(s).   Specialty: Family Medicine Contact information: [address] [place] [address] [phone]        Garvin Fila, MD. Schedule an appointment as soon as possible for a visit in 1 month(s).   Specialties: Neurology, Radiology Contact information: [address] [place] [address] [phone]              Allergies  Allergen Reactions  . Codeine Anaphylaxis and Swelling    Throat swells    . Penicillins Anaphylaxis and Swelling    Throat closes    . Sulfa  Antibiotics Anaphylaxis, Swelling and Other (See Comments)    Lips and eye swell   . Benzodiazepines Hives  . Lidocaine Hives  . Gabapentin Nausea And Vomiting     Other Procedures/Studies: CT ANGIO HEAD W OR WO CONTRAST  Addendum Date: 11/07/2019   ADDENDUM REPORT: 11/07/2019 09:10 ADDENDUM: These results were called by telephone at the time of interpretation on 11/07/2019 at 9:10 am to provider , who verbally acknowledged these results. Electronically Signed   By: Franchot Gallo M.D.   On: 11/07/2019 09:10   Result Date: 11/07/2019 CLINICAL DATA:  Dizziness.  Intractable vomiting.  Hypertension. EXAM: CT ANGIOGRAPHY HEAD AND NECK TECHNIQUE: Multidetector CT imaging of the head and neck was performed using the standard  protocol during bolus administration of intravenous contrast. Multiplanar CT image reconstructions and MIPs were obtained to evaluate the vascular anatomy. Carotid stenosis measurements (when applicable) are obtained utilizing NASCET criteria, using the distal internal carotid diameter as the denominator. CONTRAST:  106m OMNIPAQUE IOHEXOL 350 MG/ML SOLN COMPARISON:  CT head 10/30/2019 FINDINGS: CT HEAD FINDINGS Brain: Ill-defined hypodensity in the right medial temporal lobe lateral to the temporal horn has progressed since the CT of 10/30/2019. This is most consistent with subacute infarct. Mild atrophy. Negative for hydrocephalus. Patchy will chronic microvascular ischemic change in the white matter. Chronic lacunar infarction right caudate unchanged. Negative for intracranial hemorrhage or mass. Vascular: Negative for hyperdense vessel Skull: Negative Sinuses: Paranasal sinuses clear. Orbits: Negative Review of the MIP images confirms the above findings CTA NECK FINDINGS Aortic arch: Advanced atherosclerotic disease in the aortic arch and proximal great vessels. 50% diameter stenosis proximal left subclavian artery. Right carotid system: Atherosclerotic disease throughout the right common carotid artery extending to the carotid bifurcation and internal carotid artery. No significant stenosis. Left carotid system: Atherosclerotic disease left carotid bifurcation. Noncalcified focal stenosis proximal left internal carotid artery estimated 80% diameter stenosis. Vertebral arteries: Right vertebral artery is patent to the basilar without significant stenosis. Left vertebral artery is occluded at the origin with reconstitution at the skull base. Skeleton: No acute skeletal abnormality. Other neck: Negative Upper chest: Lung apices clear bilaterally. Review of the MIP images confirms the above findings CTA HEAD FINDINGS Anterior circulation: Atherosclerotic disease throughout the cavernous carotid bilaterally with mild  to moderate stenosis. Anterior and middle cerebral arteries are patent without large vessel occlusion. There is diffuse irregularity and atherosclerotic disease in the anterior and middle cerebral arteries without focal flow limiting stenosis. Posterior circulation: Right vertebral artery supplies the basilar with atherosclerotic irregularity distally but no significant stenosis. There is opacification distal left vertebral artery which is diffusely diseased. This is likely retrograde flow. PICA patent bilaterally. Diffuse atherosclerotic irregularity in the basilar with mild stenosis in the midportion. Superior cerebellar arteries are patent bilaterally. Both posterior cerebral artery patent with diffuse atherosclerotic irregularity and moderate stenosis. Venous sinuses: Normal venous enhancement Anatomic variants: None Review of the MIP images confirms the above findings IMPRESSION: 1. Ill-defined hypodensity right medial temporal lobe has progressed since the recent CT of 10/30/2019 and most consistent with subacute infarct. Recommend MRI head for further evaluation. 2. Occlusion left vertebral artery at the origin. Chronicity of this occlusion is uncertain. Right vertebral artery widely patent to the basilar. 3. 80% focal noncalcified stenosis proximal left internal carotid artery. 4. Atherosclerotic disease throughout the right carotid artery without significant stenosis. 5. Diffuse intracranial atherosclerotic disease most severe in the posterior cerebral arteries bilaterally. Electronically Signed: By: CFranchot GalloM.D. On: 11/07/2019 08:27  CT HEAD WO CONTRAST  Result Date: 10/30/2019 CLINICAL DATA:  Head trauma EXAM: CT HEAD WITHOUT CONTRAST TECHNIQUE: Contiguous axial images were obtained from the base of the skull through the vertex without intravenous contrast. COMPARISON:  None. FINDINGS: Brain: No acute territorial infarction, hemorrhage or intracranial mass. Mild atrophy. Mild hypodensity in  the white matter consistent with chronic small vessel ischemic change. Small chronic left parietal infarct. Chronic appearing lacunar infarct in the right caudate. Nonenlarged ventricles. Vascular: No hyperdense vessels.  Carotid vascular calcification Skull: Normal. Negative for fracture or focal lesion. Sinuses/Orbits: No acute finding. Other: None IMPRESSION: 1. No CT evidence for acute intracranial abnormality. 2. Atrophy and mild chronic small vessel ischemic changes of the white matter. Small chronic infarcts involving left parietal lobe and right caudate. Electronically Signed   By: Donavan Foil M.D.   On: 10/30/2019 22:27   CT ANGIO NECK W OR WO CONTRAST  Addendum Date: 11/07/2019   ADDENDUM REPORT: 11/07/2019 09:10 ADDENDUM: These results were called by telephone at the time of interpretation on 11/07/2019 at 9:10 am to provider , who verbally acknowledged these results. Electronically Signed   By: Franchot Gallo M.D.   On: 11/07/2019 09:10   Result Date: 11/07/2019 CLINICAL DATA:  Dizziness.  Intractable vomiting.  Hypertension. EXAM: CT ANGIOGRAPHY HEAD AND NECK TECHNIQUE: Multidetector CT imaging of the head and neck was performed using the standard protocol during bolus administration of intravenous contrast. Multiplanar CT image reconstructions and MIPs were obtained to evaluate the vascular anatomy. Carotid stenosis measurements (when applicable) are obtained utilizing NASCET criteria, using the distal internal carotid diameter as the denominator. CONTRAST:  55m OMNIPAQUE IOHEXOL 350 MG/ML SOLN COMPARISON:  CT head 10/30/2019 FINDINGS: CT HEAD FINDINGS Brain: Ill-defined hypodensity in the right medial temporal lobe lateral to the temporal horn has progressed since the CT of 10/30/2019. This is most consistent with subacute infarct. Mild atrophy. Negative for hydrocephalus. Patchy will chronic microvascular ischemic change in the white matter. Chronic lacunar infarction right caudate  unchanged. Negative for intracranial hemorrhage or mass. Vascular: Negative for hyperdense vessel Skull: Negative Sinuses: Paranasal sinuses clear. Orbits: Negative Review of the MIP images confirms the above findings CTA NECK FINDINGS Aortic arch: Advanced atherosclerotic disease in the aortic arch and proximal great vessels. 50% diameter stenosis proximal left subclavian artery. Right carotid system: Atherosclerotic disease throughout the right common carotid artery extending to the carotid bifurcation and internal carotid artery. No significant stenosis. Left carotid system: Atherosclerotic disease left carotid bifurcation. Noncalcified focal stenosis proximal left internal carotid artery estimated 80% diameter stenosis. Vertebral arteries: Right vertebral artery is patent to the basilar without significant stenosis. Left vertebral artery is occluded at the origin with reconstitution at the skull base. Skeleton: No acute skeletal abnormality. Other neck: Negative Upper chest: Lung apices clear bilaterally. Review of the MIP images confirms the above findings CTA HEAD FINDINGS Anterior circulation: Atherosclerotic disease throughout the cavernous carotid bilaterally with mild to moderate stenosis. Anterior and middle cerebral arteries are patent without large vessel occlusion. There is diffuse irregularity and atherosclerotic disease in the anterior and middle cerebral arteries without focal flow limiting stenosis. Posterior circulation: Right vertebral artery supplies the basilar with atherosclerotic irregularity distally but no significant stenosis. There is opacification distal left vertebral artery which is diffusely diseased. This is likely retrograde flow. PICA patent bilaterally. Diffuse atherosclerotic irregularity in the basilar with mild stenosis in the midportion. Superior cerebellar arteries are patent bilaterally. Both posterior cerebral artery patent with diffuse atherosclerotic irregularity and  moderate stenosis. Venous sinuses: Normal venous enhancement Anatomic variants: None Review of the MIP images confirms the above findings IMPRESSION: 1. Ill-defined hypodensity right medial temporal lobe has progressed since the recent CT of 10/30/2019 and most consistent with subacute infarct. Recommend MRI head for further evaluation. 2. Occlusion left vertebral artery at the origin. Chronicity of this occlusion is uncertain. Right vertebral artery widely patent to the basilar. 3. 80% focal noncalcified stenosis proximal left internal carotid artery. 4. Atherosclerotic disease throughout the right carotid artery without significant stenosis. 5. Diffuse intracranial atherosclerotic disease most severe in the posterior cerebral arteries bilaterally. Electronically Signed: By: Franchot Gallo M.D. On: 11/07/2019 08:27   MR BRAIN WO CONTRAST  Addendum Date: 11/07/2019   ADDENDUM REPORT: 11/07/2019 09:10 ADDENDUM: These results were called by telephone at the time of interpretation on 11/07/2019 at 9:09 am to provider Gissella Niblack, who verbally acknowledged these results. Electronically Signed   By: Franchot Gallo M.D.   On: 11/07/2019 09:10   Result Date: 11/07/2019 CLINICAL DATA:  Acute neuro deficit. Stroke. Hypertension and diabetes. EXAM: MRI HEAD WITHOUT CONTRAST TECHNIQUE: Multiplanar, multiecho pulse sequences of the brain and surrounding structures were obtained without intravenous contrast. COMPARISON:  CT angio head and neck 11/07/2019 FINDINGS: Brain: Restricted diffusion in the right hippocampus compatible with acute/subacute infarct. This corresponds to the CT hypodensity. No associated hemorrhage. No other areas of restricted diffusion. Patchy white matter chronic ischemic changes are noted. There is facilitated diffusion in the left parietal infarct which may be late subacute or chronic. Ventricle size normal. Cerebral volume normal for age. Negative for mass lesion. Chronic microhemorrhage right  cerebellum. Mild chronic hemorrhage in the left parietal infarct. Vascular: Normal arterial flow voids. Skull and upper cervical spine: Negative Sinuses/Orbits: Paranasal sinuses clear.  Negative orbit. Other: None IMPRESSION: Acute or subacute infarct in the right hippocampus without hemorrhage Mild chronic ischemic changes. Areas of chronic microhemorrhage including in the left parietal infarct which is small. Electronically Signed: By: Franchot Gallo M.D. On: 11/07/2019 08:52   DG Abd 2 Views  Result Date: 11/07/2019 CLINICAL DATA:  3 day history of nausea and vomiting. EXAM: ABDOMEN - 2 VIEW COMPARISON:  No comparison studies available. FINDINGS: Gas-filled central small bowel loops are nondilated. Air in stool are seen scattered along the length of a nondilated colon. Atelectasis or scarring noted at the right base. Telemetry leads overlie the abdomen. IMPRESSION: 1. Nonobstructive bowel gas pattern. 2. Atelectasis or scarring at the right lung base. Electronically Signed   By: [name] M.D.   On: 11/07/2019 07:35   ECHOCARDIOGRAM COMPLETE BUBBLE STUDY  Result Date: 11/07/2019    ECHOCARDIOGRAM REPORT   Patient Name:   RANETTE LUCKADOO Mccullars Date of Exam: 11/07/2019 Medical Rec #:  [id number]      Height:       67.0 in Accession #:    [id number]     Weight:       141.1 lb Date of Birth:  30-Sep-1945      BSA:          1.744 m Patient Age:    13 years       BP:           199/78 mmHg Patient Gender: F              HR:           59 bpm. Exam Location:  Inpatient Procedure: 2D Echo, Cardiac Doppler, Color Doppler and Saline Contrast Bubble  Study Indications:    Stroke 434.91 / I163.9  History:        Patient has no prior history of Echocardiogram examinations.                 Risk Factors:Hypertension and Diabetes. Cardiac Arrest.  Sonographer:    Tiffany Dance Referring Phys: [id number] [place]  1. Left ventricular ejection fraction, by estimation, is 60 to 65%. The left  ventricle has normal function. The left ventricle has no regional wall motion abnormalities. There is mild asymmetric left ventricular hypertrophy of the basal-septal segment. Left ventricular diastolic parameters are consistent with Grade I diastolic dysfunction (impaired relaxation). Elevated left ventricular end-diastolic pressure.  2. Right ventricular systolic function is normal. The right ventricular size is normal.  3. Left atrial size was moderately dilated.  4. The mitral valve is normal in structure. Trivial mitral valve regurgitation. No evidence of mitral stenosis. Moderate mitral annular calcification.  5. The aortic valve is tricuspid. There is mild calcification of the aortic valve. There is mild thickening of the aortic valve. Aortic valve regurgitation is not visualized. Mild aortic valve sclerosis is present, with no evidence of aortic valve stenosis.  6. The inferior vena cava is normal in size with greater than 50% respiratory variability, suggesting right atrial pressure of 3 mmHg.  7. Agitated saline contrast bubble study was negative, with no evidence of any interatrial shunt. Comparison(s): No prior Echocardiogram. Conclusion(s)/Recommendation(s): Normal biventricular function without evidence of hemodynamically significant valvular heart disease. No intracardiac source of embolism detected on this transthoracic study. A transesophageal echocardiogram is recommended to exclude cardiac source of embolism if clinically indicated. FINDINGS  Left Ventricle: Left ventricular ejection fraction, by estimation, is 60 to 65%. The left ventricle has normal function. The left ventricle has no regional wall motion abnormalities. The left ventricular internal cavity size was normal in size. There is  mild asymmetric left ventricular hypertrophy of the basal-septal segment. Left ventricular diastolic parameters are consistent with Grade I diastolic dysfunction (impaired relaxation). Elevated left  ventricular end-diastolic pressure. Right Ventricle: The right ventricular size is normal. No increase in right ventricular wall thickness. Right ventricular systolic function is normal. Left Atrium: Left atrial size was moderately dilated. Right Atrium: Right atrial size was normal in size. Pericardium: There is no evidence of pericardial effusion. Mitral Valve: The mitral valve is normal in structure. Moderate mitral annular calcification. Trivial mitral valve regurgitation. No evidence of mitral valve stenosis. Tricuspid Valve: The tricuspid valve is normal in structure. Tricuspid valve regurgitation is trivial. No evidence of tricuspid stenosis. Aortic Valve: The aortic valve is tricuspid. There is mild calcification of the aortic valve. There is mild thickening of the aortic valve. Aortic valve regurgitation is not visualized. Mild aortic valve sclerosis is present, with no evidence of aortic valve stenosis. Pulmonic Valve: The pulmonic valve was not well visualized. Pulmonic valve regurgitation is mild. No evidence of pulmonic stenosis. Aorta: The aortic root, ascending aorta and aortic arch are all structurally normal, with no evidence of dilitation or obstruction. Venous: The inferior vena cava is normal in size with greater than 50% respiratory variability, suggesting right atrial pressure of 3 mmHg. IAS/Shunts: No atrial level shunt detected by color flow Doppler. Agitated saline contrast was given intravenously to evaluate for intracardiac shunting. Agitated saline contrast bubble study was negative, with no evidence of any interatrial shunt.  LEFT VENTRICLE PLAX 2D LVIDd:         4.49 cm  Diastology LVIDs:  3.36 cm  LV e' medial:    3.92 cm/s LV PW:         0.97 cm  LV E/e' medial:  17.0 LV IVS:        1.41 cm  LV e' lateral:   4.90 cm/s LVOT diam:     2.30 cm  LV E/e' lateral: 13.6 LV SV:         107 LV SV Index:   61 LVOT Area:     4.15 cm  RIGHT VENTRICLE             IVC RV Basal diam:  2.89  cm     IVC diam: 1.60 cm RV S prime:     12.50 cm/s TAPSE (M-mode): 2.6 cm LEFT ATRIUM             Index       RIGHT ATRIUM           Index LA diam:        3.90 cm 2.24 cm/m  RA Area:     14.80 cm LA Vol (A2C):   81.5 ml 46.74 ml/m RA Volume:   38.20 ml  21.91 ml/m LA Vol (A4C):   60.2 ml 34.53 ml/m LA Biplane Vol: 70.3 ml 40.32 ml/m  AORTIC VALVE LVOT Vmax:   94.45 cm/s LVOT Vmean:  64.600 cm/s LVOT VTI:    0.258 m  AORTA Ao Root diam: 3.70 cm Ao Asc diam:  3.40 cm MITRAL VALVE MV Area (PHT): 1.87 cm     SHUNTS MV Decel Time: 405 msec     Systemic VTI:  0.26 m MV E velocity: 66.50 cm/s   Systemic Diam: 2.30 cm MV A velocity: 114.00 cm/s MV E/A ratio:  0.58 Buford Dresser MD Electronically signed by Buford Dresser MD Signature Date/Time: 11/07/2019/3:25:37 PM    Final    VAS US CAROTID  Result Date: 11/05/2019 Carotid Arterial Duplex Study Indications:                             Left bruit and Carotid artery disease. Pre-Surgical Evaluation & Surgical       Stenosis at left ICA only. ICA is not Correlation                              normal past stenosis. Anatomy on the                                          left is within normal limits.Left                                          bifurcation is located near the Hyoid                                          Notch. Performing Technologist: Ralene Cork RVT  Examination Guidelines: A complete evaluation includes B-mode imaging, spectral Doppler, color Doppler, and power Doppler as needed of all accessible portions of each vessel. Bilateral testing is considered an integral part of a complete examination. Limited examinations for  reoccurring indications may be performed as noted.  Right Carotid Findings: +----------+--------+--------+--------+-------------------------+--------+           PSV cm/sEDV cm/sStenosisPlaque Description       Comments +----------+--------+--------+--------+-------------------------+--------+ CCA Prox   134     14              heterogenous                      +----------+--------+--------+--------+-------------------------+--------+ CCA Mid   64      11              heterogenous                      +----------+--------+--------+--------+-------------------------+--------+ CCA Distal63      14              heterogenous                      +----------+--------+--------+--------+-------------------------+--------+ ICA Prox  56      15      1-39%   heterogenous and calcific         +----------+--------+--------+--------+-------------------------+--------+ ICA Mid   82      25                                                +----------+--------+--------+--------+-------------------------+--------+ ICA Distal75      27                                                +----------+--------+--------+--------+-------------------------+--------+ ECA       105     13              heterogenous                      +----------+--------+--------+--------+-------------------------+--------+ +----------+--------+-------+----------------+-------------------+           PSV cm/sEDV cmsDescribe        Arm Pressure (mmHG) +----------+--------+-------+----------------+-------------------+ Subclavian129            Multiphasic, WNL                    +----------+--------+-------+----------------+-------------------+ +---------+--------+---+--------+--+---------+ VertebralPSV cm/s114EDV cm/s31Antegrade +---------+--------+---+--------+--+---------+  Left Carotid Findings: +----------+--------+--------+--------+------------------+--------+           PSV cm/sEDV cm/sStenosisPlaque DescriptionComments +----------+--------+--------+--------+------------------+--------+ CCA Prox  67      12              heterogenous               +----------+--------+--------+--------+------------------+--------+ CCA Mid   80      15              heterogenous                +----------+--------+--------+--------+------------------+--------+ CCA Distal72      18              heterogenous               +----------+--------+--------+--------+------------------+--------+ ICA Prox  573     193             heterogenous               +----------+--------+--------+--------+------------------+--------+  ICA Mid   217     44                                         +----------+--------+--------+--------+------------------+--------+ ICA Distal61      25                                         +----------+--------+--------+--------+------------------+--------+ ECA       147                                                +----------+--------+--------+--------+------------------+--------+ +----------+--------+--------+----------------+-------------------+           PSV cm/sEDV cm/sDescribe        Arm Pressure (mmHG) +----------+--------+--------+----------------+-------------------+ [id number]             Multiphasic, WNL                    +----------+--------+--------+----------------+-------------------+ +---------+--------+--+--------+---------+ VertebralPSV cm/s43EDV cm/sAntegrade +---------+--------+--+--------+---------+   Summary: Right Carotid: Velocities in the right ICA are consistent with a 1-39% stenosis. Left Carotid: Velocities in the left ICA are consistent with a 80-99% stenosis. Vertebrals:  Bilateral vertebral arteries demonstrate antegrade flow. Subclavians: Normal flow hemodynamics were seen in bilateral subclavian              arteries. *See table(s) above for measurements and observations.  Electronically signed by Harold Barban MD on 11/05/2019 at 4:56:58 PM.    Final      TODAY-DAY OF DISCHARGE:  Subjective:   Monica Becton today has no headache,no chest abdominal pain,no new weakness tingling or numbness, feels much better   Objective:   Blood pressure (!) 159/66, pulse (!) 57, temperature 98.8 F (37.1 C),  temperature source Oral, resp. rate 16, SpO2 96 %.  Intake/Output Summary (Last 24 hours) at 11/09/2019 1648 Last data filed at 11/09/2019 6979 Gross per 24 hour  Intake 550.54 ml  Output 1200 ml  Net -649.46 ml   There were no vitals filed for this visit.  Exam: Awake Alert, Oriented *3, No new F.N deficits, Normal affect [place].AT,PERRAL Supple Neck,No JVD, No cervical lymphadenopathy appriciated.  Symmetrical Chest wall movement, Good air movement bilaterally, CTAB RRR,No Gallops,Rubs or new Murmurs, No Parasternal Heave +ve B.Sounds, Abd Soft, Non tender, No organomegaly appriciated, No rebound -guarding or rigidity. No Cyanosis, Clubbing or edema, No new Rash or bruise   PERTINENT RADIOLOGIC STUDIES: No results found.   PERTINENT LAB RESULTS: CBC: Recent Labs    11/07/19 0339 11/08/19 0406  WBC 11.1* 12.7*  HGB 12.2 13.0  HCT 36.3 38.6  PLT 299 311   CMET CMP     Component Value Date/Time   NA 137 11/08/2019 0406   K 3.5 11/08/2019 0406   CL 103 11/08/2019 0406   CO2 25 11/08/2019 0406   GLUCOSE 165 (H) 11/08/2019 0406   BUN 14 11/08/2019 0406   CREATININE 1.04 (H) 11/08/2019 0406   CALCIUM 9.2 11/08/2019 0406   PROT 5.8 (L) 11/08/2019 0406   ALBUMIN 3.3 (L) 11/08/2019 0406   AST 17 11/08/2019 0406   ALT 14 11/08/2019 0406   ALKPHOS 73 11/08/2019 0406   BILITOT 0.8 11/08/2019  0406   GFRNONAA 56 (L) 11/08/2019 0406   GFRAA  09/17/2008 1612    >60        The eGFR has been calculated using the MDRD equation. This calculation has not been validated in all clinical situations. eGFR's persistently <60 mL/min signify possible Chronic Kidney Disease.    GFR Estimated Creatinine Clearance: 45.5 mL/min (A) (by C-G formula based on SCr of 1.04 mg/dL (H)). No results for input(s): LIPASE, AMYLASE in the last 72 hours. No results for input(s): CKTOTAL, CKMB, CKMBINDEX, TROPONINI in the last 72 hours. Invalid input(s): POCBNP No results for input(s): DDIMER  in the last 72 hours. Recent Labs    11/07/19 0339  HGBA1C 10.1*   Recent Labs    11/08/19 0406  CHOL 173  HDL 36*  LDLCALC 121*  TRIG 82  CHOLHDL 4.8   No results for input(s): TSH, T4TOTAL, T3FREE, THYROIDAB in the last 72 hours.  Invalid input(s): FREET3 No results for input(s): VITAMINB12, FOLATE, FERRITIN, TIBC, IRON, RETICCTPCT in the last 72 hours. Coags: Recent Labs    11/08/19 0406  INR 1.0   Microbiology: Recent Results (from the past 240 hour(s))  Urine culture     Status: Abnormal   Collection Time: 11/06/19  4:55 AM   Specimen: Urine, Clean Catch  Result Value Ref Range Status   Specimen Description   Final    URINE, CLEAN CATCH Performed at [facility], [address] [place], [place] [address]    Special Requests   Final    NONE Performed at [facility], [address], [place], [place] [address]    Culture MULTIPLE SPECIES PRESENT, SUGGEST RECOLLECTION (A)  Final   Report Status 11/08/2019 FINAL  Final  Respiratory Panel by RT PCR (Flu A&B, Covid) - Nasopharyngeal Swab     Status: None   Collection Time: 11/07/19  3:43 AM   Specimen: Nasopharyngeal Swab  Result Value Ref Range Status   SARS Coronavirus 2 by RT PCR NEGATIVE NEGATIVE Final    Comment: (NOTE) SARS-CoV-2 target nucleic acids are NOT DETECTED.  The SARS-CoV-2 RNA is generally detectable in upper respiratoy specimens during the acute phase of infection. The lowest concentration of SARS-CoV-2 viral copies this assay can detect is 131 copies/mL. A negative result does not preclude SARS-Cov-2 infection and should not be used as the sole basis for treatment or other patient management decisions. A negative result may occur with  improper specimen collection/handling, submission of specimen other than nasopharyngeal swab, presence of viral mutation(s) within the areas targeted by this assay, and inadequate number of viral copies (<131 copies/mL). A negative result must be combined with  clinical observations, patient history, and epidemiological information. The expected result is Negative.  Fact Sheet for Patients:  PinkCheek.be  Fact Sheet for Healthcare Providers:  GravelBags.it  This test is no t yet approved or cleared by the [place] FDA and  has been authorized for detection and/or diagnosis of SARS-CoV-2 by FDA under an Emergency Use Authorization (EUA). This EUA will remain  in effect (meaning this test can be used) for the duration of the COVID-19 declaration under Section 564(b)(1) of the Act, 21 U.S.C. section 360bbb-3(b)(1), unless the authorization is terminated or revoked sooner.     Influenza A by PCR NEGATIVE NEGATIVE Final   Influenza B by PCR NEGATIVE NEGATIVE Final    Comment: (NOTE) The Xpert Xpress SARS-CoV-2/FLU/RSV assay is intended as an aid in  the diagnosis of influenza from Nasopharyngeal swab specimens and  should not be used as a sole basis for treatment. Nasal washings and  aspirates are unacceptable for Xpert Xpress SARS-CoV-2/FLU/RSV  testing.  Fact Sheet for Patients: PinkCheek.be  Fact Sheet for Healthcare Providers: GravelBags.it  This test is not yet approved or cleared by the [place] FDA and  has been authorized for detection and/or diagnosis of SARS-CoV-2 by  FDA under an Emergency Use Authorization (EUA). This EUA will remain  in effect (meaning this test can be used) for the duration of the  Covid-19 declaration under Section 564(b)(1) of the Act, 21  U.S.C. section 360bbb-3(b)(1), unless the authorization is  terminated or revoked. Performed at [facility], [place] [address], [place], [place] [address]     FURTHER DISCHARGE INSTRUCTIONS:  Get Medicines reviewed and adjusted: Please take all your medications with you for your next visit with your Primary MD  Laboratory/radiological  data: Please request your Primary MD to go over all hospital tests and procedure/radiological results at the follow up, please ask your Primary MD to get all Hospital records sent to his/her office.  In some cases, they will be blood work, cultures and biopsy results pending at the time of your discharge. Please request that your primary care M.D. goes through all the records of your hospital data and follows up on these results.  Also Note the following: If you experience worsening of your admission symptoms, develop shortness of breath, life threatening emergency, suicidal or homicidal thoughts you must seek medical attention immediately by calling 911 or calling your MD immediately  if symptoms less severe.  You must read complete instructions/literature along with all the possible adverse reactions/side effects for all the Medicines you take and that have been prescribed to you. Take any new Medicines after you have completely understood and accpet all the possible adverse reactions/side effects.   Do not drive when taking Pain medications or sleeping medications (Benzodaizepines)  Do not take more than prescribed Pain, Sleep and Anxiety Medications. It is not advisable to combine anxiety,sleep and pain medications without talking with your primary care practitioner  Special Instructions: If you have smoked or chewed Tobacco  in the last 2 yrs please stop smoking, stop any regular Alcohol  and or any Recreational drug use.  Wear Seat belts while driving.  Please note: You were cared for by a hospitalist during your hospital stay. Once you are discharged, your primary care physician will handle any further medical issues. Please note that NO REFILLS for any discharge medications will be authorized once you are discharged, as it is imperative that you return to your primary care physician (or establish a relationship with a primary care physician if you do not have one) for your post hospital  discharge needs so that they can reassess your need for medications and monitor your lab values.  Total Time spent coordinating discharge including counseling, education and face to face time equals 35 minutes.  SignedOren Binet 11/09/2019 4:48 PM

## 2019-11-09 NOTE — Progress Notes (Signed)
Inpatient Rehabilitation-Admissions Coordinator   I have received insurance approval for admit to CIR today. I received medical clearance from Dr. Jerral Ralph for DC to CIR today. Notified pt and family of insurance approval and bed offer; they have accepted. Reviewed all paperwork and consent forms. All questions answered. RN and TOC aware of plan.   Please call if questions.   Cheri Rous, OTR/L  Rehab Admissions Coordinator  [phone] 11/09/2019 5:08 PM

## 2019-11-09 NOTE — Progress Notes (Signed)
Marcello Fennel, MD  Physician  Physical Medicine and Rehabilitation  Consult Note      Signed  Date of Service:  11/09/2019  5:25 AM      Related encounter: ED to Hosp-Admission (Current) from 11/07/2019 in [place] [facility]      Signed      Expand All Collapse All           Physical Medicine and Rehabilitation Consult Reason for Consult: Decreased functional mobility with frequent falls Referring Physician: [facility]     HPI: Chelsea [name] is a 74 y.o. right-handed female with history of hypertension, diabetes mellitus, left carotid stenosis 80% and was scheduled for carotid enterectomy in late November with Dr. Arbie Cookey tobacco abuse, blindness left eye, medical noncompliance.  History taken from chart review, daughters, and patient due to cognition.  Prior to admission, patient required assistance with all ADLs, which was provided by her daughter.  1 level home 4 steps to entry.  She lives with her children and assistance is needed.  She presented on 11/07/2019 with nausea/vomiting as well as loss of vision left peripheral field.  Noted blood pressure 216/79. Patient did have a CT scan of the head 10/30/2019 after a fall unremarkable for acute intracranial process.  MRI showed acute or subacute infarct in the right hippocampus without hemorrhage.  Mild chronic ischemic changes.  CT angiogram of head and neck ill-defined hypodensity right medial temporal lobe progressed since recent CT of 10/30/2019 most consistent with subacute infarct.  Occlusion left vertebral artery at the origin.  80% focal noncalcified stenosis proximal left ICA artery.  Admission chemistries unremarkable aside glucose 245, BUN 26, urine culture multiple species, troponin negative, hemoglobin A1c 10.1.  Echocardiogram with ejection fraction of 60 to 65%, no wall motion abnormalities grade 1 diastolic dysfunction.  Currently maintained on aspirin and Plavix for CVA prophylaxis x3 months then Plavix alone.   Subcutaneous Lovenox for DVT prophylaxis.  Hospital course further complicated by urinary retention, urology consulted Foley catheter tube was placed 11/09/2019 with plan for voiding trial.  Tolerating a regular diet.  Therapy evaluations completed with recommendations of physical medicine rehab consult.   Review of Systems  Constitutional: Positive for malaise/fatigue. Negative for chills and fever.  HENT: Negative for hearing loss.   Eyes: Positive for blurred vision.  Respiratory: Negative for cough and shortness of breath.   Cardiovascular: Negative for chest pain, palpitations and leg swelling.  Gastrointestinal: Positive for nausea and vomiting. Negative for abdominal pain and constipation.  Genitourinary: Negative for dysuria, flank pain and hematuria.  Musculoskeletal: Positive for falls, joint pain and myalgias.  Skin: Negative for rash.  Neurological: Positive for focal weakness and weakness. Negative for sensory change.  All other systems reviewed and are negative.   Past Medical History:  Diagnosis Date  . Blind left eye    . Cardiac arrest (HCC)    . Diabetes mellitus without complication (HCC)    . Hypertension    . Stroke [facility])      History reviewed. No pertinent surgical history.      Family History  Problem Relation Age of Onset  . Heart attack Mother    . Stroke Father      Social History:  reports that she has been smoking. She has never used smokeless tobacco. She reports previous alcohol use. She reports that she does not use drugs. Allergies:       Allergies  Allergen Reactions  . Codeine Anaphylaxis and Swelling  Throat swells     . Penicillins Anaphylaxis and Swelling      Throat closes     . Sulfa Antibiotics Anaphylaxis, Swelling and Other (See Comments)      Lips and eye swell    . Benzodiazepines Hives  . Lidocaine Hives  . Gabapentin Nausea And Vomiting    Medications Prior to Admission  Medication Sig Dispense Refill  . ascorbic  acid (VITAMIN C) 500 MG tablet Take 500 mg by mouth daily.      Marland Kitchen aspirin EC 81 MG tablet Take 81 mg by mouth daily. Swallow whole.      . Calcium-Magnesium-Zinc (CAL-MAG-ZINC PO) Take 1 tablet by mouth daily.      . Multiple Vitamin (MULTI-VITAMIN DAILY PO) Take by mouth.      . hydrochlorothiazide (HYDRODIURIL) 25 MG tablet Take 1 tablet (25 mg total) by mouth daily. 30 tablet 1  . metFORMIN (GLUCOPHAGE) 500 MG tablet Take 1 tablet (500 mg total) by mouth 2 (two) times daily with a meal. (Patient not taking: Reported on 11/05/2019) 60 tablet 1      Home: Home Living Family/patient expects to be discharged to:: Private residence Living Arrangements: Children Available Help at Discharge: Family, Available 24 hours/day Type of Home: House Home Access: Stairs to enter [employer] of Steps: 4 Entrance Stairs-Rails: Can reach both Home Layout: One level Bathroom Shower/Tub: Engineer, manufacturing systems: Handicapped height Home Equipment: Information systems manager, Environmental consultant - 2 wheels, Other (comment) Additional Comments: has a friend installing rails throughout bathroom; has adjustable bed; lift chair  Functional History: Prior Function Level of Independence: Independent with assistive device(s) Comments: was independent without AD until last week- then began using RW Functional Status:  Mobility: Bed Mobility Overal bed mobility: Needs Assistance Bed Mobility: Rolling, Sidelying to Sit, Sit to Supine Rolling: Mod assist Sidelying to sit: Mod assist, +2 for safety/equipment Supine to sit: Mod assist Sit to supine: Max assist, +2 for physical assistance, +2 for safety/equipment, HOB elevated General bed mobility comments: rolling bilaterally requiring mod assist for trunk and LE translation, increased time to perform secondary to pt dizziness. Mod-max+2 for supine<>sit for trunk and LE management, scooting to and from EOB, and boost up in bed with use of bed pads. Step-by-step sequencing  required. Transfers Overall transfer level: Needs assistance Equipment used: 1 person hand held assist Transfers: Sit to/from Stand Sit to Stand: Min assist General transfer comment: unable to attempt 2/2 n/v and fatigue/lethargy   ADL: ADL Overall ADL's : Needs assistance/impaired Eating/Feeding: Set up, Sitting Grooming: Set up, Sitting Upper Body Bathing: Minimal assistance, Sitting Lower Body Bathing: Minimal assistance, Sit to/from stand, Sitting/lateral leans Upper Body Dressing : Set up, Sitting Lower Body Dressing: Minimal assistance, Sit to/from stand, Sitting/lateral leans Toilet Transfer: Minimal assistance, Stand-pivot, BSC, RW Toileting- Clothing Manipulation and Hygiene: Minimal assistance, Sit to/from stand Functional mobility during ADLs: Minimal assistance, Moderate assistance, Rolling walker, Cueing for sequencing, Cueing for safety General ADL Comments: Bed mobility and sat EOB several minutes close min guard level-light steadying A. Onset of n/v sitting EOB.    Cognition: Cognition Overall Cognitive Status: Impaired/Different from baseline Orientation Level: Oriented to person, Oriented to place Cognition Arousal/Alertness: Lethargic Behavior During Therapy: Anxious, WFL for tasks assessed/performed Overall Cognitive Status: Impaired/Different from baseline Area of Impairment: Safety/judgement Safety/Judgement: Decreased awareness of safety, Decreased awareness of deficits General Comments: more alert sitting EOB, initially drowsy and stating "I feel too weak (to get up)". Pt with L inattention sitting EOB, able  to track towards L when cued.   Blood pressure (!) 187/63, pulse 65, temperature 98.5 F (36.9 C), temperature source Oral, resp. rate 20, SpO2 96 %. Physical Exam Vitals reviewed.  Constitutional:      General: She is not in acute distress.    Appearance: She is normal weight.  HENT:     Head: Normocephalic and atraumatic.     Right Ear:  External ear normal.     Left Ear: External ear normal.     Nose: Nose normal.  Eyes:     General:        Right eye: No discharge.        Left eye: No discharge.     Extraocular Movements: Extraocular movements intact.  Cardiovascular:     Rate and Rhythm: Normal rate and regular rhythm.  Pulmonary:     Effort: Pulmonary effort is normal. No respiratory distress.     Breath sounds: No stridor.  Abdominal:     General: Abdomen is flat. Bowel sounds are normal. There is no distension.  Musculoskeletal:     Cervical back: Normal range of motion and neck supple.     Comments: No edema or tenderness in extremities  Skin:    General: Skin is warm and dry.  Neurological:     Mental Status: She is alert.     Comments: Alert Provides name and age.   Follows simple commands. Sensation diminished light touch bilateral feet Hyperalgesia left shoulder Motor: Bilateral upper extremities: 5/5 proximal distal Bilateral lower extremities: Hip flexion 4 -/5, knee extension 4/5, ankle dorsiflexion 5/5 (right weaker than left) Left neglect  Psychiatric:        Mood and Affect: Affect is blunt.        Speech: Speech is delayed.        Lab Results Last 24 Hours       Results for orders placed or performed during the hospital encounter of 11/07/19 (from the past 24 hour(s))  Glucose, capillary     Status: Abnormal    Collection Time: 11/08/19  6:43 AM  Result Value Ref Range    Glucose-Capillary 124 (H) 70 - 99 mg/dL    Comment 1 Notify RN      Comment 2 Document in Chart    Glucose, capillary     Status: Abnormal    Collection Time: 11/08/19 12:39 PM  Result Value Ref Range    Glucose-Capillary 158 (H) 70 - 99 mg/dL  Glucose, capillary     Status: Abnormal    Collection Time: 11/08/19  4:33 PM  Result Value Ref Range    Glucose-Capillary 157 (H) 70 - 99 mg/dL  Glucose, capillary     Status: Abnormal    Collection Time: 11/08/19  9:12 PM  Result Value Ref Range    Glucose-Capillary  132 (H) 70 - 99 mg/dL       Imaging Results (Last 48 hours)  CT ANGIO HEAD W OR WO CONTRAST   Addendum Date: 11/07/2019   ADDENDUM REPORT: 11/07/2019 09:10 ADDENDUM: These results were called by telephone at the time of interpretation on 11/07/2019 at 9:10 am to provider Shanker, who verbally acknowledged these results. Electronically Signed   By: Marlan Palau M.D.   On: 11/07/2019 09:10    Result Date: 11/07/2019 CLINICAL DATA:  Dizziness.  Intractable vomiting.  Hypertension. EXAM: CT ANGIOGRAPHY HEAD AND NECK TECHNIQUE: Multidetector CT imaging of the head and neck was performed using the standard protocol during bolus administration  of intravenous contrast. Multiplanar CT image reconstructions and MIPs were obtained to evaluate the vascular anatomy. Carotid stenosis measurements (when applicable) are obtained utilizing NASCET criteria, using the distal internal carotid diameter as the denominator. CONTRAST:  59mL OMNIPAQUE IOHEXOL 350 MG/ML SOLN COMPARISON:  CT head 10/30/2019 FINDINGS: CT HEAD FINDINGS Brain: Ill-defined hypodensity in the right medial temporal lobe lateral to the temporal horn has progressed since the CT of 10/30/2019. This is most consistent with subacute infarct. Mild atrophy. Negative for hydrocephalus. Patchy will chronic microvascular ischemic change in the white matter. Chronic lacunar infarction right caudate unchanged. Negative for intracranial hemorrhage or mass. Vascular: Negative for hyperdense vessel Skull: Negative Sinuses: Paranasal sinuses clear. Orbits: Negative Review of the MIP images confirms the above findings CTA NECK FINDINGS Aortic arch: Advanced atherosclerotic disease in the aortic arch and proximal great vessels. 50% diameter stenosis proximal left subclavian artery. Right carotid system: Atherosclerotic disease throughout the right common carotid artery extending to the carotid bifurcation and internal carotid artery. No significant stenosis. Left  carotid system: Atherosclerotic disease left carotid bifurcation. Noncalcified focal stenosis proximal left internal carotid artery estimated 80% diameter stenosis. Vertebral arteries: Right vertebral artery is patent to the basilar without significant stenosis. Left vertebral artery is occluded at the origin with reconstitution at the skull base. Skeleton: No acute skeletal abnormality. Other neck: Negative Upper chest: Lung apices clear bilaterally. Review of the MIP images confirms the above findings CTA HEAD FINDINGS Anterior circulation: Atherosclerotic disease throughout the cavernous carotid bilaterally with mild to moderate stenosis. Anterior and middle cerebral arteries are patent without large vessel occlusion. There is diffuse irregularity and atherosclerotic disease in the anterior and middle cerebral arteries without focal flow limiting stenosis. Posterior circulation: Right vertebral artery supplies the basilar with atherosclerotic irregularity distally but no significant stenosis. There is opacification distal left vertebral artery which is diffusely diseased. This is likely retrograde flow. PICA patent bilaterally. Diffuse atherosclerotic irregularity in the basilar with mild stenosis in the midportion. Superior cerebellar arteries are patent bilaterally. Both posterior cerebral artery patent with diffuse atherosclerotic irregularity and moderate stenosis. Venous sinuses: Normal venous enhancement Anatomic variants: None Review of the MIP images confirms the above findings IMPRESSION: 1. Ill-defined hypodensity right medial temporal lobe has progressed since the recent CT of 10/30/2019 and most consistent with subacute infarct. Recommend MRI head for further evaluation. 2. Occlusion left vertebral artery at the origin. Chronicity of this occlusion is uncertain. Right vertebral artery widely patent to the basilar. 3. 80% focal noncalcified stenosis proximal left internal carotid artery. 4.  Atherosclerotic disease throughout the right carotid artery without significant stenosis. 5. Diffuse intracranial atherosclerotic disease most severe in the posterior cerebral arteries bilaterally. Electronically Signed: By: Marlan Palau M.D. On: 11/07/2019 08:27    CT ANGIO NECK W OR WO CONTRAST   Addendum Date: 11/07/2019   ADDENDUM REPORT: 11/07/2019 09:10 ADDENDUM: These results were called by telephone at the time of interpretation on 11/07/2019 at 9:10 am to provider Shanker, who verbally acknowledged these results. Electronically Signed   By: Marlan Palau M.D.   On: 11/07/2019 09:10    Result Date: 11/07/2019 CLINICAL DATA:  Dizziness.  Intractable vomiting.  Hypertension. EXAM: CT ANGIOGRAPHY HEAD AND NECK TECHNIQUE: Multidetector CT imaging of the head and neck was performed using the standard protocol during bolus administration of intravenous contrast. Multiplanar CT image reconstructions and MIPs were obtained to evaluate the vascular anatomy. Carotid stenosis measurements (when applicable) are obtained utilizing NASCET criteria, using the distal internal carotid  diameter as the denominator. CONTRAST:  66mL OMNIPAQUE IOHEXOL 350 MG/ML SOLN COMPARISON:  CT head 10/30/2019 FINDINGS: CT HEAD FINDINGS Brain: Ill-defined hypodensity in the right medial temporal lobe lateral to the temporal horn has progressed since the CT of 10/30/2019. This is most consistent with subacute infarct. Mild atrophy. Negative for hydrocephalus. Patchy will chronic microvascular ischemic change in the white matter. Chronic lacunar infarction right caudate unchanged. Negative for intracranial hemorrhage or mass. Vascular: Negative for hyperdense vessel Skull: Negative Sinuses: Paranasal sinuses clear. Orbits: Negative Review of the MIP images confirms the above findings CTA NECK FINDINGS Aortic arch: Advanced atherosclerotic disease in the aortic arch and proximal great vessels. 50% diameter stenosis proximal left  subclavian artery. Right carotid system: Atherosclerotic disease throughout the right common carotid artery extending to the carotid bifurcation and internal carotid artery. No significant stenosis. Left carotid system: Atherosclerotic disease left carotid bifurcation. Noncalcified focal stenosis proximal left internal carotid artery estimated 80% diameter stenosis. Vertebral arteries: Right vertebral artery is patent to the basilar without significant stenosis. Left vertebral artery is occluded at the origin with reconstitution at the skull base. Skeleton: No acute skeletal abnormality. Other neck: Negative Upper chest: Lung apices clear bilaterally. Review of the MIP images confirms the above findings CTA HEAD FINDINGS Anterior circulation: Atherosclerotic disease throughout the cavernous carotid bilaterally with mild to moderate stenosis. Anterior and middle cerebral arteries are patent without large vessel occlusion. There is diffuse irregularity and atherosclerotic disease in the anterior and middle cerebral arteries without focal flow limiting stenosis. Posterior circulation: Right vertebral artery supplies the basilar with atherosclerotic irregularity distally but no significant stenosis. There is opacification distal left vertebral artery which is diffusely diseased. This is likely retrograde flow. PICA patent bilaterally. Diffuse atherosclerotic irregularity in the basilar with mild stenosis in the midportion. Superior cerebellar arteries are patent bilaterally. Both posterior cerebral artery patent with diffuse atherosclerotic irregularity and moderate stenosis. Venous sinuses: Normal venous enhancement Anatomic variants: None Review of the MIP images confirms the above findings IMPRESSION: 1. Ill-defined hypodensity right medial temporal lobe has progressed since the recent CT of 10/30/2019 and most consistent with subacute infarct. Recommend MRI head for further evaluation. 2. Occlusion left vertebral  artery at the origin. Chronicity of this occlusion is uncertain. Right vertebral artery widely patent to the basilar. 3. 80% focal noncalcified stenosis proximal left internal carotid artery. 4. Atherosclerotic disease throughout the right carotid artery without significant stenosis. 5. Diffuse intracranial atherosclerotic disease most severe in the posterior cerebral arteries bilaterally. Electronically Signed: By: Marlan Palau M.D. On: 11/07/2019 08:27    MR BRAIN WO CONTRAST   Addendum Date: 11/07/2019   ADDENDUM REPORT: 11/07/2019 09:10 ADDENDUM: These results were called by telephone at the time of interpretation on 11/07/2019 at 9:09 am to provider Shanker, who verbally acknowledged these results. Electronically Signed   By: Marlan Palau M.D.   On: 11/07/2019 09:10    Result Date: 11/07/2019 CLINICAL DATA:  Acute neuro deficit. Stroke. Hypertension and diabetes. EXAM: MRI HEAD WITHOUT CONTRAST TECHNIQUE: Multiplanar, multiecho pulse sequences of the brain and surrounding structures were obtained without intravenous contrast. COMPARISON:  CT angio head and neck 11/07/2019 FINDINGS: Brain: Restricted diffusion in the right hippocampus compatible with acute/subacute infarct. This corresponds to the CT hypodensity. No associated hemorrhage. No other areas of restricted diffusion. Patchy white matter chronic ischemic changes are noted. There is facilitated diffusion in the left parietal infarct which may be late subacute or chronic. Ventricle size normal. Cerebral volume normal for age.  Negative for mass lesion. Chronic microhemorrhage right cerebellum. Mild chronic hemorrhage in the left parietal infarct. Vascular: Normal arterial flow voids. Skull and upper cervical spine: Negative Sinuses/Orbits: Paranasal sinuses clear.  Negative orbit. Other: None IMPRESSION: Acute or subacute infarct in the right hippocampus without hemorrhage Mild chronic ischemic changes. Areas of chronic microhemorrhage  including in the left parietal infarct which is small. Electronically Signed: By: Marlan Palau M.D. On: 11/07/2019 08:52    DG Abd 2 Views   Result Date: 11/07/2019 CLINICAL DATA:  3 day history of nausea and vomiting. EXAM: ABDOMEN - 2 VIEW COMPARISON:  No comparison studies available. FINDINGS: Gas-filled central small bowel loops are nondilated. Air in stool are seen scattered along the length of a nondilated colon. Atelectasis or scarring noted at the right base. Telemetry leads overlie the abdomen. IMPRESSION: 1. Nonobstructive bowel gas pattern. 2. Atelectasis or scarring at the right lung base. Electronically Signed   By: Kennith Center M.D.   On: 11/07/2019 07:35    ECHOCARDIOGRAM COMPLETE BUBBLE STUDY   Result Date: 11/07/2019    ECHOCARDIOGRAM REPORT   Patient Name:   ALEENE SWANNER [place] Date of Exam: 11/07/2019 Medical Rec #:  [id number]      Height:       67.0 in Accession #:    [id number]     Weight:       141.1 lb Date of Birth:  1945-07-06      BSA:          1.744 m Patient Age:    74 years       BP:           199/78 mmHg Patient Gender: F              HR:           59 bpm. Exam Location:  Inpatient Procedure: 2D Echo, Cardiac Doppler, Color Doppler and Saline Contrast Bubble            Study Indications:    Stroke 434.91 / I163.9  History:        Patient has no prior history of Echocardiogram examinations.                 Risk Factors:Hypertension and Diabetes. Cardiac Arrest.  Sonographer:    Tiffany Dance Referring Phys: [id number] Deno Lunger [place] IMPRESSIONS  1. Left ventricular ejection fraction, by estimation, is 60 to 65%. The left ventricle has normal function. The left ventricle has no regional wall motion abnormalities. There is mild asymmetric left ventricular hypertrophy of the basal-septal segment. Left ventricular diastolic parameters are consistent with Grade I diastolic dysfunction (impaired relaxation). Elevated left ventricular end-diastolic pressure.  2. Right ventricular  systolic function is normal. The right ventricular size is normal.  3. Left atrial size was moderately dilated.  4. The mitral valve is normal in structure. Trivial mitral valve regurgitation. No evidence of mitral stenosis. Moderate mitral annular calcification.  5. The aortic valve is tricuspid. There is mild calcification of the aortic valve. There is mild thickening of the aortic valve. Aortic valve regurgitation is not visualized. Mild aortic valve sclerosis is present, with no evidence of aortic valve stenosis.  6. The inferior vena cava is normal in size with greater than 50% respiratory variability, suggesting right atrial pressure of 3 mmHg.  7. Agitated saline contrast bubble study was negative, with no evidence of any interatrial shunt. Comparison(s): No prior Echocardiogram. Conclusion(s)/Recommendation(s): Normal biventricular function without evidence of hemodynamically significant valvular  heart disease. No intracardiac source of embolism detected on this transthoracic study. A transesophageal echocardiogram is recommended to exclude cardiac source of embolism if clinically indicated. FINDINGS  Left Ventricle: Left ventricular ejection fraction, by estimation, is 60 to 65%. The left ventricle has normal function. The left ventricle has no regional wall motion abnormalities. The left ventricular internal cavity size was normal in size. There is  mild asymmetric left ventricular hypertrophy of the basal-septal segment. Left ventricular diastolic parameters are consistent with Grade I diastolic dysfunction (impaired relaxation). Elevated left ventricular end-diastolic pressure. Right Ventricle: The right ventricular size is normal. No increase in right ventricular wall thickness. Right ventricular systolic function is normal. Left Atrium: Left atrial size was moderately dilated. Right Atrium: Right atrial size was normal in size. Pericardium: There is no evidence of pericardial effusion. Mitral Valve:  The mitral valve is normal in structure. Moderate mitral annular calcification. Trivial mitral valve regurgitation. No evidence of mitral valve stenosis. Tricuspid Valve: The tricuspid valve is normal in structure. Tricuspid valve regurgitation is trivial. No evidence of tricuspid stenosis. Aortic Valve: The aortic valve is tricuspid. There is mild calcification of the aortic valve. There is mild thickening of the aortic valve. Aortic valve regurgitation is not visualized. Mild aortic valve sclerosis is present, with no evidence of aortic valve stenosis. Pulmonic Valve: The pulmonic valve was not well visualized. Pulmonic valve regurgitation is mild. No evidence of pulmonic stenosis. Aorta: The aortic root, ascending aorta and aortic arch are all structurally normal, with no evidence of dilitation or obstruction. Venous: The inferior vena cava is normal in size with greater than 50% respiratory variability, suggesting right atrial pressure of 3 mmHg. IAS/Shunts: No atrial level shunt detected by color flow Doppler. Agitated saline contrast was given intravenously to evaluate for intracardiac shunting. Agitated saline contrast bubble study was negative, with no evidence of any interatrial shunt.  LEFT VENTRICLE PLAX 2D LVIDd:         4.49 cm  Diastology LVIDs:         3.36 cm  LV e' medial:    3.92 cm/s LV PW:         0.97 cm  LV E/e' medial:  17.0 LV IVS:        1.41 cm  LV e' lateral:   4.90 cm/s LVOT diam:     2.30 cm  LV E/e' lateral: 13.6 LV SV:         107 LV SV Index:   61 LVOT Area:     4.15 cm  RIGHT VENTRICLE             IVC RV Basal diam:  2.89 cm     IVC diam: 1.60 cm RV S prime:     12.50 cm/s TAPSE (M-mode): 2.6 cm LEFT ATRIUM             Index       RIGHT ATRIUM           Index LA diam:        3.90 cm 2.24 cm/m  RA Area:     14.80 cm LA Vol (A2C):   81.5 ml 46.74 ml/m RA Volume:   38.20 ml  21.91 ml/m LA Vol (A4C):   60.2 ml 34.53 ml/m LA Biplane Vol: 70.3 ml 40.32 ml/m  AORTIC VALVE LVOT Vmax:    94.45 cm/s LVOT Vmean:  64.600 cm/s LVOT VTI:    0.258 m  AORTA Ao Root diam: 3.70 cm Ao Asc diam:  3.40 cm MITRAL VALVE MV Area (PHT): 1.87 cm     SHUNTS MV Decel Time: 405 msec     Systemic VTI:  0.26 m MV E velocity: 66.50 cm/s   Systemic Diam: 2.30 cm MV A velocity: 114.00 cm/s MV E/A ratio:  0.58 Jodelle RedBridgette Christopher MD Electronically signed by Jodelle RedBridgette Christopher MD Signature Date/Time: 11/07/2019/3:25:37 PM    Final        Assessment/Plan: Diagnosis: Right hippocampus infarct Stroke: Continue secondary stroke prophylaxis and Risk Factor Modification listed below:   Antiplatelet therapy:   Blood Pressure Management:  Continue current medication with prn's with permisive HTN per primary team Statin Agent:   Diabetes management:   Tobacco abuse:   PT/OT for mobility, ADL training  Motor recovery: Zoloft Labs independently reviewed.  Records reviewed and summated above.   1. Does the need for close, 24 hr/day medical supervision in concert with the patient's rehab needs make it unreasonable for this patient to be served in a less intensive setting? Yes  2. Co-Morbidities requiring supervision/potential complications: HTN (monitor and provide prns in accordance with increased physical exertion and pain), DM with hyperglycemia (Monitor in accordance with exercise and adjust meds as necessary), left carotid stenosis (was scheduled for carotid enterectomy),  tobacco abuse (counsel), blindness left eye, medical noncompliance, leukocytosis (repeat labs, cont to monitor for signs and symptoms of infection, further workup if indicated), AKI (avoid nephrotoxic meds.  Encourage fluids, repeat labs), postherpetic neuralgia (optimize neuropathic pain medications).   3. Due to safety, disease management, medication administration and patient education, does the patient require 24 hr/day rehab nursing? Yes 4. Does the patient require coordinated care of a physician, rehab nurse, therapy disciplines of  PT/OT to address physical and functional deficits in the context of the above medical diagnosis(es)? Yes Addressing deficits in the following areas: balance, endurance, locomotion, strength, transferring, bathing, dressing, toileting and psychosocial support 5. Can the patient actively participate in an intensive therapy program of at least 3 hrs of therapy per day at least 5 days per week? Yes 6. The potential for patient to make measurable gains while on inpatient rehab is excellent 7. Anticipated functional outcomes upon discharge from inpatient rehab are min assist and mod assist  with PT, min assist and mod assist with OT, n/a with SLP. 8. Estimated rehab length of stay to reach the above functional goals is: 10-14 days. 9. Anticipated discharge destination: Home 10. Overall Rehab/Functional Prognosis: good and fair   RECOMMENDATIONS: This patient's condition is appropriate for continued rehabilitative care in the following setting: CIR Patient has agreed to participate in recommended program. Yes Note that insurance prior authorization may be required for reimbursement for recommended care.   Comment: Rehab Admissions Coordinator to follow up.   I have personally performed a face to face diagnostic evaluation, including, but not limited to relevant history and physical exam findings, of this patient and developed relevant assessment and plan.  Additionally, I have reviewed and concur with the physician assistant's documentation above.    Maryla MorrowAnkit Patel, MD, ABPMR Charlton Amoraniel J Angiulli, PA-C 11/09/2019        Revision History                     Routing History           Note Details  Author Allena KatzPatel, Maryln GottronAnkit Anil, MD File Time 11/09/2019 12:03 PM  Author Type Physician Status Signed  Last Editor Marcello FennelPatel, Ankit Anil, MD Service Physical Medicine and Rehabilitation

## 2019-11-09 NOTE — Progress Notes (Signed)
Pt admitted to unit from 3w via bed and escorted by RN x2. She is alert and oriented to person and place and time and situation. She does not appear in distress. No complaints of pain. VSS stable at this time. Pt daughter bedside.

## 2019-11-09 NOTE — TOC Transition Note (Signed)
Transition of Care [facility]) - CM/SW Discharge Note   Patient Details  Name: Chelsea [name] MRN: [id number] Date of Birth: 14-Apr-1945  [facility] of [facility] CM/SW Contact:  Kermit Balo, RN Phone Number: 11/09/2019, 4:31 PM   Clinical Narrative:    Pt is discharging to CIR today. CM signing off.   Final next level of care: IP Rehab Facility Barriers to Discharge: No Barriers Identified   Patient Goals and CMS Choice        Discharge Placement                       Discharge Plan and Services                                     Social Determinants of Health (SDOH) Interventions     Readmission Risk Interventions No flowsheet data found.

## 2019-11-09 NOTE — Progress Notes (Signed)
Inpatient Rehabilitation-Admissions Coordinator   Met with pt bedside to discuss recommended rehab program (please see PM&R consult note completed by Dr. Posey Pronto for further details). Pt and her daughter expressed interest in our program and would like to proceed. I will begin insurance auth process for possible admit. Will update once there has been a determination.   Please call if questions.   Raechel Ache, OTR/L  Rehab Admissions Coordinator  [phone] 11/09/2019 2:00 PM

## 2019-11-09 NOTE — Consult Note (Addendum)
CONSULT NOTE Referring MD: Dr Rachael Darbyhotiner Subjective: Patient reports patient who was admitted to the neurology floor with weakness.  Inability to void yesterday required in and out catheterization.  Patient distended and unable to void tonight and nurses could not place Foley asked me to assist in Foley catheter placement.  Objective: Vital signs in last 24 hours: Temp:  [98.1 F (36.7 C)-98.9 F (37.2 C)] 98.5 F (36.9 C) (10/29 0013) Pulse Rate:  [64-76] 65 (10/29 0013) Resp:  [16-20] 20 (10/29 0013) BP: (150-213)/(57-84) 187/63 (10/29 0013) SpO2:  [94 %-96 %] 96 % (10/28 2100)  Intake/Output from previous day: 10/28 0701 - 10/29 0700 In: 550.5 [P.O.:240; I.V.:310.5] Out: -  Intake/Output this shift: No intake/output data recorded.  Physical Exam:  General:cooperative, appears stated age, mild distress and moderate distress GI: not done Female genitalia: not done exam limited by vaginismus  Lab Results: Recent Labs    11/06/19 0204 11/07/19 0339 11/08/19 0406  HGB 12.4 12.2 13.0  HCT 36.7 36.3 38.6   BMET Recent Labs    11/07/19 0339 11/08/19 0406  NA 132* 137  K 3.9 3.5  CL 98 103  CO2 27 25  GLUCOSE 260* 165*  BUN 15 14  CREATININE 0.82 1.04*  CALCIUM 8.9 9.2   Recent Labs    11/08/19 0406  INR 1.0   No results for input(s): LABURIN in the last 72 hours. Results for orders placed or performed during the hospital encounter of 11/07/19  Respiratory Panel by RT PCR (Flu A&B, Covid) - Nasopharyngeal Swab     Status: None   Collection Time: 11/07/19  3:43 AM   Specimen: Nasopharyngeal Swab  Result Value Ref Range Status   SARS Coronavirus 2 by RT PCR NEGATIVE NEGATIVE Final    Comment: (NOTE) SARS-CoV-2 target nucleic acids are NOT DETECTED.  The SARS-CoV-2 RNA is generally detectable in upper respiratoy specimens during the acute phase of infection. The lowest concentration of SARS-CoV-2 viral copies this assay can detect is 131 copies/mL. A  negative result does not preclude SARS-Cov-2 infection and should not be used as the sole basis for treatment or other patient management decisions. A negative result may occur with  improper specimen collection/handling, submission of specimen other than nasopharyngeal swab, presence of viral mutation(s) within the areas targeted by this assay, and inadequate number of viral copies (<131 copies/mL). A negative result must be combined with clinical observations, patient history, and epidemiological information. The expected result is Negative.  Fact Sheet for Patients:  https://www.moore.com/https://www.fda.gov/media/142436/download  Fact Sheet for Healthcare Providers:  https://www.young.biz/https://www.fda.gov/media/142435/download  This test is no t yet approved or cleared by the [place] FDA and  has been authorized for detection and/or diagnosis of SARS-CoV-2 by FDA under an Emergency Use Authorization (EUA). This EUA will remain  in effect (meaning this test can be used) for the duration of the COVID-19 declaration under Section 564(b)(1) of the Act, 21 U.S.C. section 360bbb-3(b)(1), unless the authorization is terminated or revoked sooner.     Influenza A by PCR NEGATIVE NEGATIVE Final   Influenza B by PCR NEGATIVE NEGATIVE Final    Comment: (NOTE) The Xpert Xpress SARS-CoV-2/FLU/RSV assay is intended as an aid in  the diagnosis of influenza from Nasopharyngeal swab specimens and  should not be used as a sole basis for treatment. Nasal washings and  aspirates are unacceptable for Xpert Xpress SARS-CoV-2/FLU/RSV  testing.  Fact Sheet for Patients: https://www.moore.com/https://www.fda.gov/media/142436/download  Fact Sheet for Healthcare Providers: https://www.young.biz/https://www.fda.gov/media/142435/download  This test is not yet  approved or cleared by the [place] and  has been authorized for detection and/or diagnosis of SARS-CoV-2 by  FDA under an Emergency Use Authorization (EUA). This EUA will remain  in effect (meaning this test can  be used) for the duration of the  Covid-19 declaration under Section 564(b)(1) of the Act, 21  U.S.C. section 360bbb-3(b)(1), unless the authorization is  terminated or revoked. Performed at [facility], [address] [place], [place] [address]     Studies/Results: CT ANGIO HEAD W OR WO CONTRAST  Addendum Date: 11/07/2019   ADDENDUM REPORT: 11/07/2019 09:10 ADDENDUM: These results were called by telephone at the time of interpretation on 11/07/2019 at 9:10 am to provider Shanker, who verbally acknowledged these results. Electronically Signed   By: Marlan Palau M.D.   On: 11/07/2019 09:10   Result Date: 11/07/2019 CLINICAL DATA:  Dizziness.  Intractable vomiting.  Hypertension. EXAM: CT ANGIOGRAPHY HEAD AND NECK TECHNIQUE: Multidetector CT imaging of the head and neck was performed using the standard protocol during bolus administration of intravenous contrast. Multiplanar CT image reconstructions and MIPs were obtained to evaluate the vascular anatomy. Carotid stenosis measurements (when applicable) are obtained utilizing NASCET criteria, using the distal internal carotid diameter as the denominator. CONTRAST:  75mL OMNIPAQUE IOHEXOL 350 MG/ML SOLN COMPARISON:  CT head 10/30/2019 FINDINGS: CT HEAD FINDINGS Brain: Ill-defined hypodensity in the right medial temporal lobe lateral to the temporal horn has progressed since the CT of 10/30/2019. This is most consistent with subacute infarct. Mild atrophy. Negative for hydrocephalus. Patchy will chronic microvascular ischemic change in the white matter. Chronic lacunar infarction right caudate unchanged. Negative for intracranial hemorrhage or mass. Vascular: Negative for hyperdense vessel Skull: Negative Sinuses: Paranasal sinuses clear. Orbits: Negative Review of the MIP images confirms the above findings CTA NECK FINDINGS Aortic arch: Advanced atherosclerotic disease in the aortic arch and proximal great vessels. 50% diameter stenosis  proximal left subclavian artery. Right carotid system: Atherosclerotic disease throughout the right common carotid artery extending to the carotid bifurcation and internal carotid artery. No significant stenosis. Left carotid system: Atherosclerotic disease left carotid bifurcation. Noncalcified focal stenosis proximal left internal carotid artery estimated 80% diameter stenosis. Vertebral arteries: Right vertebral artery is patent to the basilar without significant stenosis. Left vertebral artery is occluded at the origin with reconstitution at the skull base. Skeleton: No acute skeletal abnormality. Other neck: Negative Upper chest: Lung apices clear bilaterally. Review of the MIP images confirms the above findings CTA HEAD FINDINGS Anterior circulation: Atherosclerotic disease throughout the cavernous carotid bilaterally with mild to moderate stenosis. Anterior and middle cerebral arteries are patent without large vessel occlusion. There is diffuse irregularity and atherosclerotic disease in the anterior and middle cerebral arteries without focal flow limiting stenosis. Posterior circulation: Right vertebral artery supplies the basilar with atherosclerotic irregularity distally but no significant stenosis. There is opacification distal left vertebral artery which is diffusely diseased. This is likely retrograde flow. PICA patent bilaterally. Diffuse atherosclerotic irregularity in the basilar with mild stenosis in the midportion. Superior cerebellar arteries are patent bilaterally. Both posterior cerebral artery patent with diffuse atherosclerotic irregularity and moderate stenosis. Venous sinuses: Normal venous enhancement Anatomic variants: None Review of the MIP images confirms the above findings IMPRESSION: 1. Ill-defined hypodensity right medial temporal lobe has progressed since the recent CT of 10/30/2019 and most consistent with subacute infarct. Recommend MRI head for further evaluation. 2. Occlusion  left vertebral artery at the origin. Chronicity of this occlusion is uncertain. Right  vertebral artery widely patent to the basilar. 3. 80% focal noncalcified stenosis proximal left internal carotid artery. 4. Atherosclerotic disease throughout the right carotid artery without significant stenosis. 5. Diffuse intracranial atherosclerotic disease most severe in the posterior cerebral arteries bilaterally. Electronically Signed: By: Marlan Palau M.D. On: 11/07/2019 08:27   CT ANGIO NECK W OR WO CONTRAST  Addendum Date: 11/07/2019   ADDENDUM REPORT: 11/07/2019 09:10 ADDENDUM: These results were called by telephone at the time of interpretation on 11/07/2019 at 9:10 am to provider Shanker, who verbally acknowledged these results. Electronically Signed   By: Marlan Palau M.D.   On: 11/07/2019 09:10   Result Date: 11/07/2019 CLINICAL DATA:  Dizziness.  Intractable vomiting.  Hypertension. EXAM: CT ANGIOGRAPHY HEAD AND NECK TECHNIQUE: Multidetector CT imaging of the head and neck was performed using the standard protocol during bolus administration of intravenous contrast. Multiplanar CT image reconstructions and MIPs were obtained to evaluate the vascular anatomy. Carotid stenosis measurements (when applicable) are obtained utilizing NASCET criteria, using the distal internal carotid diameter as the denominator. CONTRAST:  74mL OMNIPAQUE IOHEXOL 350 MG/ML SOLN COMPARISON:  CT head 10/30/2019 FINDINGS: CT HEAD FINDINGS Brain: Ill-defined hypodensity in the right medial temporal lobe lateral to the temporal horn has progressed since the CT of 10/30/2019. This is most consistent with subacute infarct. Mild atrophy. Negative for hydrocephalus. Patchy will chronic microvascular ischemic change in the white matter. Chronic lacunar infarction right caudate unchanged. Negative for intracranial hemorrhage or mass. Vascular: Negative for hyperdense vessel Skull: Negative Sinuses: Paranasal sinuses clear. Orbits:  Negative Review of the MIP images confirms the above findings CTA NECK FINDINGS Aortic arch: Advanced atherosclerotic disease in the aortic arch and proximal great vessels. 50% diameter stenosis proximal left subclavian artery. Right carotid system: Atherosclerotic disease throughout the right common carotid artery extending to the carotid bifurcation and internal carotid artery. No significant stenosis. Left carotid system: Atherosclerotic disease left carotid bifurcation. Noncalcified focal stenosis proximal left internal carotid artery estimated 80% diameter stenosis. Vertebral arteries: Right vertebral artery is patent to the basilar without significant stenosis. Left vertebral artery is occluded at the origin with reconstitution at the skull base. Skeleton: No acute skeletal abnormality. Other neck: Negative Upper chest: Lung apices clear bilaterally. Review of the MIP images confirms the above findings CTA HEAD FINDINGS Anterior circulation: Atherosclerotic disease throughout the cavernous carotid bilaterally with mild to moderate stenosis. Anterior and middle cerebral arteries are patent without large vessel occlusion. There is diffuse irregularity and atherosclerotic disease in the anterior and middle cerebral arteries without focal flow limiting stenosis. Posterior circulation: Right vertebral artery supplies the basilar with atherosclerotic irregularity distally but no significant stenosis. There is opacification distal left vertebral artery which is diffusely diseased. This is likely retrograde flow. PICA patent bilaterally. Diffuse atherosclerotic irregularity in the basilar with mild stenosis in the midportion. Superior cerebellar arteries are patent bilaterally. Both posterior cerebral artery patent with diffuse atherosclerotic irregularity and moderate stenosis. Venous sinuses: Normal venous enhancement Anatomic variants: None Review of the MIP images confirms the above findings IMPRESSION: 1.  Ill-defined hypodensity right medial temporal lobe has progressed since the recent CT of 10/30/2019 and most consistent with subacute infarct. Recommend MRI head for further evaluation. 2. Occlusion left vertebral artery at the origin. Chronicity of this occlusion is uncertain. Right vertebral artery widely patent to the basilar. 3. 80% focal noncalcified stenosis proximal left internal carotid artery. 4. Atherosclerotic disease throughout the right carotid artery without significant stenosis. 5. Diffuse intracranial atherosclerotic disease most  severe in the posterior cerebral arteries bilaterally. Electronically Signed: By: Marlan Palau M.D. On: 11/07/2019 08:27   MR BRAIN WO CONTRAST  Addendum Date: 11/07/2019   ADDENDUM REPORT: 11/07/2019 09:10 ADDENDUM: These results were called by telephone at the time of interpretation on 11/07/2019 at 9:09 am to provider Shanker, who verbally acknowledged these results. Electronically Signed   By: Marlan Palau M.D.   On: 11/07/2019 09:10   Result Date: 11/07/2019 CLINICAL DATA:  Acute neuro deficit. Stroke. Hypertension and diabetes. EXAM: MRI HEAD WITHOUT CONTRAST TECHNIQUE: Multiplanar, multiecho pulse sequences of the brain and surrounding structures were obtained without intravenous contrast. COMPARISON:  CT angio head and neck 11/07/2019 FINDINGS: Brain: Restricted diffusion in the right hippocampus compatible with acute/subacute infarct. This corresponds to the CT hypodensity. No associated hemorrhage. No other areas of restricted diffusion. Patchy white matter chronic ischemic changes are noted. There is facilitated diffusion in the left parietal infarct which may be late subacute or chronic. Ventricle size normal. Cerebral volume normal for age. Negative for mass lesion. Chronic microhemorrhage right cerebellum. Mild chronic hemorrhage in the left parietal infarct. Vascular: Normal arterial flow voids. Skull and upper cervical spine: Negative  Sinuses/Orbits: Paranasal sinuses clear.  Negative orbit. Other: None IMPRESSION: Acute or subacute infarct in the right hippocampus without hemorrhage Mild chronic ischemic changes. Areas of chronic microhemorrhage including in the left parietal infarct which is small. Electronically Signed: By: Marlan Palau M.D. On: 11/07/2019 08:52   DG Abd 2 Views  Result Date: 11/07/2019 CLINICAL DATA:  3 day history of nausea and vomiting. EXAM: ABDOMEN - 2 VIEW COMPARISON:  No comparison studies available. FINDINGS: Gas-filled central small bowel loops are nondilated. Air in stool are seen scattered along the length of a nondilated colon. Atelectasis or scarring noted at the right base. Telemetry leads overlie the abdomen. IMPRESSION: 1. Nonobstructive bowel gas pattern. 2. Atelectasis or scarring at the right lung base. Electronically Signed   By: Kennith Center M.D.   On: 11/07/2019 07:35   ECHOCARDIOGRAM COMPLETE BUBBLE STUDY  Result Date: 11/07/2019    ECHOCARDIOGRAM REPORT   Patient Name:   Chelsea [name] Date of Exam: 11/07/2019 Medical Rec #:  [id number]      Height:       67.0 in Accession #:    [id number]     Weight:       141.1 lb Date of Birth:  01-29-1945      BSA:          1.744 m Patient Age:    74 years       BP:           199/78 mmHg Patient Gender: F              HR:           59 bpm. Exam Location:  Inpatient Procedure: 2D Echo, Cardiac Doppler, Color Doppler and Saline Contrast Bubble            Study Indications:    Stroke 434.91 / I163.9  History:        Patient has no prior history of Echocardiogram examinations.                 Risk Factors:Hypertension and Diabetes. Cardiac Arrest.  Sonographer:    Tiffany Dance Referring Phys: [id number] Deno Lunger [place] IMPRESSIONS  1. Left ventricular ejection fraction, by estimation, is 60 to 65%. The left ventricle has normal function. The left ventricle has no regional wall  motion abnormalities. There is mild asymmetric left ventricular hypertrophy of the  basal-septal segment. Left ventricular diastolic parameters are consistent with Grade I diastolic dysfunction (impaired relaxation). Elevated left ventricular end-diastolic pressure.  2. Right ventricular systolic function is normal. The right ventricular size is normal.  3. Left atrial size was moderately dilated.  4. The mitral valve is normal in structure. Trivial mitral valve regurgitation. No evidence of mitral stenosis. Moderate mitral annular calcification.  5. The aortic valve is tricuspid. There is mild calcification of the aortic valve. There is mild thickening of the aortic valve. Aortic valve regurgitation is not visualized. Mild aortic valve sclerosis is present, with no evidence of aortic valve stenosis.  6. The inferior vena cava is normal in size with greater than 50% respiratory variability, suggesting right atrial pressure of 3 mmHg.  7. Agitated saline contrast bubble study was negative, with no evidence of any interatrial shunt. Comparison(s): No prior Echocardiogram. Conclusion(s)/Recommendation(s): Normal biventricular function without evidence of hemodynamically significant valvular heart disease. No intracardiac source of embolism detected on this transthoracic study. A transesophageal echocardiogram is recommended to exclude cardiac source of embolism if clinically indicated. FINDINGS  Left Ventricle: Left ventricular ejection fraction, by estimation, is 60 to 65%. The left ventricle has normal function. The left ventricle has no regional wall motion abnormalities. The left ventricular internal cavity size was normal in size. There is  mild asymmetric left ventricular hypertrophy of the basal-septal segment. Left ventricular diastolic parameters are consistent with Grade I diastolic dysfunction (impaired relaxation). Elevated left ventricular end-diastolic pressure. Right Ventricle: The right ventricular size is normal. No increase in right ventricular wall thickness. Right ventricular  systolic function is normal. Left Atrium: Left atrial size was moderately dilated. Right Atrium: Right atrial size was normal in size. Pericardium: There is no evidence of pericardial effusion. Mitral Valve: The mitral valve is normal in structure. Moderate mitral annular calcification. Trivial mitral valve regurgitation. No evidence of mitral valve stenosis. Tricuspid Valve: The tricuspid valve is normal in structure. Tricuspid valve regurgitation is trivial. No evidence of tricuspid stenosis. Aortic Valve: The aortic valve is tricuspid. There is mild calcification of the aortic valve. There is mild thickening of the aortic valve. Aortic valve regurgitation is not visualized. Mild aortic valve sclerosis is present, with no evidence of aortic valve stenosis. Pulmonic Valve: The pulmonic valve was not well visualized. Pulmonic valve regurgitation is mild. No evidence of pulmonic stenosis. Aorta: The aortic root, ascending aorta and aortic arch are all structurally normal, with no evidence of dilitation or obstruction. Venous: The inferior vena cava is normal in size with greater than 50% respiratory variability, suggesting right atrial pressure of 3 mmHg. IAS/Shunts: No atrial level shunt detected by color flow Doppler. Agitated saline contrast was given intravenously to evaluate for intracardiac shunting. Agitated saline contrast bubble study was negative, with no evidence of any interatrial shunt.  LEFT VENTRICLE PLAX 2D LVIDd:         4.49 cm  Diastology LVIDs:         3.36 cm  LV e' medial:    3.92 cm/s LV PW:         0.97 cm  LV E/e' medial:  17.0 LV IVS:        1.41 cm  LV e' lateral:   4.90 cm/s LVOT diam:     2.30 cm  LV E/e' lateral: 13.6 LV SV:         107 LV SV Index:   61 LVOT Area:  4.15 cm  RIGHT VENTRICLE             IVC RV Basal diam:  2.89 cm     IVC diam: 1.60 cm RV S prime:     12.50 cm/s TAPSE (M-mode): 2.6 cm LEFT ATRIUM             Index       RIGHT ATRIUM           Index LA diam:         3.90 cm 2.24 cm/m  RA Area:     14.80 cm LA Vol (A2C):   81.5 ml 46.74 ml/m RA Volume:   38.20 ml  21.91 ml/m LA Vol (A4C):   60.2 ml 34.53 ml/m LA Biplane Vol: 70.3 ml 40.32 ml/m  AORTIC VALVE LVOT Vmax:   94.45 cm/s LVOT Vmean:  64.600 cm/s LVOT VTI:    0.258 m  AORTA Ao Root diam: 3.70 cm Ao Asc diam:  3.40 cm MITRAL VALVE MV Area (PHT): 1.87 cm     SHUNTS MV Decel Time: 405 msec     Systemic VTI:  0.26 m MV E velocity: 66.50 cm/s   Systemic Diam: 2.30 cm MV A velocity: 114.00 cm/s MV E/A ratio:  0.58 Jodelle Red MD Electronically signed by Jodelle Red MD Signature Date/Time: 11/07/2019/3:25:37 PM    Final   Procedure: 28 French Foley catheter was placed without difficulty, no resistance noted.  Proximate 600 cc clear urine obtained  Assessment/Plan: Urinary retention inability void-successful Foley placement  Recommendation: Would leave the catheter through the weekend and then give trial of voiding early Monday morning.   LOS: 2 days   Belva Agee 11/09/2019, 12:26 AM

## 2019-11-09 NOTE — Progress Notes (Signed)
Inpatient Rehabilitation Medication Review by a Pharmacist  A complete drug regimen review was completed for this patient to identify any potential clinically significant medication issues.  Clinically significant medication issues were identified:  yes   Type of Medication Issue Identified Description of Issue Urgent (address now) Non-Urgent (address on AM team rounds) Plan Plan Accepted by Provider? (Yes / No / Pending AM Rounds)  Drug Interaction(s) (clinically significant)       Duplicate Therapy       Allergy       No Medication Administration End Date       Incorrect Dose       Additional Drug Therapy Needed   Coreg has not been resumed.  Non-urgent   (received dose this PM prior to CIR admission. Ok to address 10/30 AM. Please address:  consider resume Coreg or indicate plan.    Other         Name of provider notified for urgent issues identified:  Dr. Riley Kill  Provider Method of Notification:  [facility] Secure Chat message sent to Dr. Riley Kill for tomorrow morning.   Pharmacist comments:   Dr. Jerral Ralph indicated on discharge summary to continue Coreg 6.25 mg BID.   Coreg was not rdered on Inpatient Rehab admission.   Consider resuming Coreg or indicate if plan is to hold.  Time spent performing this drug regimen review (minutes): 10   Noah Delaine, [place] Clinical Pharmacist 11/09/2019 8:01 PM

## 2019-11-09 NOTE — Consult Note (Signed)
Physical Medicine and Rehabilitation Consult Reason for Consult: Decreased functional mobility with frequent falls Referring Physician: [facility]   HPI: Chelsea [name] is a 74 y.o. right-handed female with history of hypertension, diabetes mellitus, left carotid stenosis 80% and was scheduled for carotid enterectomy in late November with Dr. Arbie Cookey tobacco abuse, blindness left eye, medical noncompliance.  History taken from chart review, daughters, and patient due to cognition.  Prior to admission, patient required assistance with all ADLs, which was provided by her daughter.  1 level home 4 steps to entry.  She lives with her children and assistance is needed.  She presented on 11/07/2019 with nausea/vomiting as well as loss of vision left peripheral field.  Noted blood pressure 216/79. Patient did have a CT scan of the head 10/30/2019 after a fall unremarkable for acute intracranial process.  MRI showed acute or subacute infarct in the right hippocampus without hemorrhage.  Mild chronic ischemic changes.  CT angiogram of head and neck ill-defined hypodensity right medial temporal lobe progressed since recent CT of 10/30/2019 most consistent with subacute infarct.  Occlusion left vertebral artery at the origin.  80% focal noncalcified stenosis proximal left ICA artery.  Admission chemistries unremarkable aside glucose 245, BUN 26, urine culture multiple species, troponin negative, hemoglobin A1c 10.1.  Echocardiogram with ejection fraction of 60 to 65%, no wall motion abnormalities grade 1 diastolic dysfunction.  Currently maintained on aspirin and Plavix for CVA prophylaxis x3 months then Plavix alone.  Subcutaneous Lovenox for DVT prophylaxis.  Hospital course further complicated by urinary retention, urology consulted Foley catheter tube was placed 11/09/2019 with plan for voiding trial.  Tolerating a regular diet.  Therapy evaluations completed with recommendations of physical medicine rehab  consult.  Review of Systems  Constitutional: Positive for malaise/fatigue. Negative for chills and fever.  HENT: Negative for hearing loss.   Eyes: Positive for blurred vision.  Respiratory: Negative for cough and shortness of breath.   Cardiovascular: Negative for chest pain, palpitations and leg swelling.  Gastrointestinal: Positive for nausea and vomiting. Negative for abdominal pain and constipation.  Genitourinary: Negative for dysuria, flank pain and hematuria.  Musculoskeletal: Positive for falls, joint pain and myalgias.  Skin: Negative for rash.  Neurological: Positive for focal weakness and weakness. Negative for sensory change.  All other systems reviewed and are negative.  Past Medical History:  Diagnosis Date  . Blind left eye   . Cardiac arrest (HCC)   . Diabetes mellitus without complication (HCC)   . Hypertension   . Stroke [facility])    History reviewed. No pertinent surgical history. Family History  Problem Relation Age of Onset  . Heart attack Mother   . Stroke Father    Social History:  reports that she has been smoking. She has never used smokeless tobacco. She reports previous alcohol use. She reports that she does not use drugs. Allergies:  Allergies  Allergen Reactions  . Codeine Anaphylaxis and Swelling    Throat swells    . Penicillins Anaphylaxis and Swelling    Throat closes    . Sulfa Antibiotics Anaphylaxis, Swelling and Other (See Comments)    Lips and eye swell   . Benzodiazepines Hives  . Lidocaine Hives  . Gabapentin Nausea And Vomiting   Medications Prior to Admission  Medication Sig Dispense Refill  . ascorbic acid (VITAMIN C) 500 MG tablet Take 500 mg by mouth daily.    Marland Kitchen aspirin EC 81 MG tablet Take 81 mg by mouth  daily. Swallow whole.    . Calcium-Magnesium-Zinc (CAL-MAG-ZINC PO) Take 1 tablet by mouth daily.    . Multiple Vitamin (MULTI-VITAMIN DAILY PO) Take by mouth.    . hydrochlorothiazide (HYDRODIURIL) 25 MG tablet Take 1  tablet (25 mg total) by mouth daily. 30 tablet 1  . metFORMIN (GLUCOPHAGE) 500 MG tablet Take 1 tablet (500 mg total) by mouth 2 (two) times daily with a meal. (Patient not taking: Reported on 11/05/2019) 60 tablet 1    Home: Home Living Family/patient expects to be discharged to:: Private residence Living Arrangements: Children Available Help at Discharge: Family, Available 24 hours/day Type of Home: House Home Access: Stairs to enter [employer] of Steps: 4 Entrance Stairs-Rails: Can reach both Home Layout: One level Bathroom Shower/Tub: Engineer, manufacturing systems: Handicapped height Home Equipment: Information systems manager, Environmental consultant - 2 wheels, Other (comment) Additional Comments: has a friend installing rails throughout bathroom; has adjustable bed; lift chair  Functional History: Prior Function Level of Independence: Independent with assistive device(s) Comments: was independent without AD until last week- then began using RW Functional Status:  Mobility: Bed Mobility Overal bed mobility: Needs Assistance Bed Mobility: Rolling, Sidelying to Sit, Sit to Supine Rolling: Mod assist Sidelying to sit: Mod assist, +2 for safety/equipment Supine to sit: Mod assist Sit to supine: Max assist, +2 for physical assistance, +2 for safety/equipment, HOB elevated General bed mobility comments: rolling bilaterally requiring mod assist for trunk and LE translation, increased time to perform secondary to pt dizziness. Mod-max+2 for supine<>sit for trunk and LE management, scooting to and from EOB, and boost up in bed with use of bed pads. Step-by-step sequencing required. Transfers Overall transfer level: Needs assistance Equipment used: 1 person hand held assist Transfers: Sit to/from Stand Sit to Stand: Min assist General transfer comment: unable to attempt 2/2 n/v and fatigue/lethargy      ADL: ADL Overall ADL's : Needs assistance/impaired Eating/Feeding: Set up, Sitting Grooming:  Set up, Sitting Upper Body Bathing: Minimal assistance, Sitting Lower Body Bathing: Minimal assistance, Sit to/from stand, Sitting/lateral leans Upper Body Dressing : Set up, Sitting Lower Body Dressing: Minimal assistance, Sit to/from stand, Sitting/lateral leans Toilet Transfer: Minimal assistance, Stand-pivot, BSC, RW Toileting- Clothing Manipulation and Hygiene: Minimal assistance, Sit to/from stand Functional mobility during ADLs: Minimal assistance, Moderate assistance, Rolling walker, Cueing for sequencing, Cueing for safety General ADL Comments: Bed mobility and sat EOB several minutes close min guard level-light steadying A. Onset of n/v sitting EOB.   Cognition: Cognition Overall Cognitive Status: Impaired/Different from baseline Orientation Level: Oriented to person, Oriented to place Cognition Arousal/Alertness: Lethargic Behavior During Therapy: Anxious, WFL for tasks assessed/performed Overall Cognitive Status: Impaired/Different from baseline Area of Impairment: Safety/judgement Safety/Judgement: Decreased awareness of safety, Decreased awareness of deficits General Comments: more alert sitting EOB, initially drowsy and stating "I feel too weak (to get up)". Pt with L inattention sitting EOB, able to track towards L when cued.  Blood pressure (!) 187/63, pulse 65, temperature 98.5 F (36.9 C), temperature source Oral, resp. rate 20, SpO2 96 %. Physical Exam Vitals reviewed.  Constitutional:      General: She is not in acute distress.    Appearance: She is normal weight.  HENT:     Head: Normocephalic and atraumatic.     Right Ear: External ear normal.     Left Ear: External ear normal.     Nose: Nose normal.  Eyes:     General:        Right eye: No  discharge.        Left eye: No discharge.     Extraocular Movements: Extraocular movements intact.  Cardiovascular:     Rate and Rhythm: Normal rate and regular rhythm.  Pulmonary:     Effort: Pulmonary effort is  normal. No respiratory distress.     Breath sounds: No stridor.  Abdominal:     General: Abdomen is flat. Bowel sounds are normal. There is no distension.  Musculoskeletal:     Cervical back: Normal range of motion and neck supple.     Comments: No edema or tenderness in extremities  Skin:    General: Skin is warm and dry.  Neurological:     Mental Status: She is alert.     Comments: Alert Provides name and age.   Follows simple commands. Sensation diminished light touch bilateral feet Hyperalgesia left shoulder Motor: Bilateral upper extremities: 5/5 proximal distal Bilateral lower extremities: Hip flexion 4 -/5, knee extension 4/5, ankle dorsiflexion 5/5 (right weaker than left) Left neglect  Psychiatric:        Mood and Affect: Affect is blunt.        Speech: Speech is delayed.     Results for orders placed or performed during the hospital encounter of 11/07/19 (from the past 24 hour(s))  Glucose, capillary     Status: Abnormal   Collection Time: 11/08/19  6:43 AM  Result Value Ref Range   Glucose-Capillary 124 (H) 70 - 99 mg/dL   Comment 1 Notify RN    Comment 2 Document in Chart   Glucose, capillary     Status: Abnormal   Collection Time: 11/08/19 12:39 PM  Result Value Ref Range   Glucose-Capillary 158 (H) 70 - 99 mg/dL  Glucose, capillary     Status: Abnormal   Collection Time: 11/08/19  4:33 PM  Result Value Ref Range   Glucose-Capillary 157 (H) 70 - 99 mg/dL  Glucose, capillary     Status: Abnormal   Collection Time: 11/08/19  9:12 PM  Result Value Ref Range   Glucose-Capillary 132 (H) 70 - 99 mg/dL   CT ANGIO HEAD W OR WO CONTRAST  Addendum Date: 11/07/2019   ADDENDUM REPORT: 11/07/2019 09:10 ADDENDUM: These results were called by telephone at the time of interpretation on 11/07/2019 at 9:10 am to provider Shanker, who verbally acknowledged these results. Electronically Signed   By: Marlan Palau M.D.   On: 11/07/2019 09:10   Result Date:  11/07/2019 CLINICAL DATA:  Dizziness.  Intractable vomiting.  Hypertension. EXAM: CT ANGIOGRAPHY HEAD AND NECK TECHNIQUE: Multidetector CT imaging of the head and neck was performed using the standard protocol during bolus administration of intravenous contrast. Multiplanar CT image reconstructions and MIPs were obtained to evaluate the vascular anatomy. Carotid stenosis measurements (when applicable) are obtained utilizing NASCET criteria, using the distal internal carotid diameter as the denominator. CONTRAST:  17mL OMNIPAQUE IOHEXOL 350 MG/ML SOLN COMPARISON:  CT head 10/30/2019 FINDINGS: CT HEAD FINDINGS Brain: Ill-defined hypodensity in the right medial temporal lobe lateral to the temporal horn has progressed since the CT of 10/30/2019. This is most consistent with subacute infarct. Mild atrophy. Negative for hydrocephalus. Patchy will chronic microvascular ischemic change in the white matter. Chronic lacunar infarction right caudate unchanged. Negative for intracranial hemorrhage or mass. Vascular: Negative for hyperdense vessel Skull: Negative Sinuses: Paranasal sinuses clear. Orbits: Negative Review of the MIP images confirms the above findings CTA NECK FINDINGS Aortic arch: Advanced atherosclerotic disease in the aortic arch and proximal great  vessels. 50% diameter stenosis proximal left subclavian artery. Right carotid system: Atherosclerotic disease throughout the right common carotid artery extending to the carotid bifurcation and internal carotid artery. No significant stenosis. Left carotid system: Atherosclerotic disease left carotid bifurcation. Noncalcified focal stenosis proximal left internal carotid artery estimated 80% diameter stenosis. Vertebral arteries: Right vertebral artery is patent to the basilar without significant stenosis. Left vertebral artery is occluded at the origin with reconstitution at the skull base. Skeleton: No acute skeletal abnormality. Other neck: Negative Upper chest:  Lung apices clear bilaterally. Review of the MIP images confirms the above findings CTA HEAD FINDINGS Anterior circulation: Atherosclerotic disease throughout the cavernous carotid bilaterally with mild to moderate stenosis. Anterior and middle cerebral arteries are patent without large vessel occlusion. There is diffuse irregularity and atherosclerotic disease in the anterior and middle cerebral arteries without focal flow limiting stenosis. Posterior circulation: Right vertebral artery supplies the basilar with atherosclerotic irregularity distally but no significant stenosis. There is opacification distal left vertebral artery which is diffusely diseased. This is likely retrograde flow. PICA patent bilaterally. Diffuse atherosclerotic irregularity in the basilar with mild stenosis in the midportion. Superior cerebellar arteries are patent bilaterally. Both posterior cerebral artery patent with diffuse atherosclerotic irregularity and moderate stenosis. Venous sinuses: Normal venous enhancement Anatomic variants: None Review of the MIP images confirms the above findings IMPRESSION: 1. Ill-defined hypodensity right medial temporal lobe has progressed since the recent CT of 10/30/2019 and most consistent with subacute infarct. Recommend MRI head for further evaluation. 2. Occlusion left vertebral artery at the origin. Chronicity of this occlusion is uncertain. Right vertebral artery widely patent to the basilar. 3. 80% focal noncalcified stenosis proximal left internal carotid artery. 4. Atherosclerotic disease throughout the right carotid artery without significant stenosis. 5. Diffuse intracranial atherosclerotic disease most severe in the posterior cerebral arteries bilaterally. Electronically Signed: By: Marlan Palau M.D. On: 11/07/2019 08:27   CT ANGIO NECK W OR WO CONTRAST  Addendum Date: 11/07/2019   ADDENDUM REPORT: 11/07/2019 09:10 ADDENDUM: These results were called by telephone at the time of  interpretation on 11/07/2019 at 9:10 am to provider Shanker, who verbally acknowledged these results. Electronically Signed   By: Marlan Palau M.D.   On: 11/07/2019 09:10   Result Date: 11/07/2019 CLINICAL DATA:  Dizziness.  Intractable vomiting.  Hypertension. EXAM: CT ANGIOGRAPHY HEAD AND NECK TECHNIQUE: Multidetector CT imaging of the head and neck was performed using the standard protocol during bolus administration of intravenous contrast. Multiplanar CT image reconstructions and MIPs were obtained to evaluate the vascular anatomy. Carotid stenosis measurements (when applicable) are obtained utilizing NASCET criteria, using the distal internal carotid diameter as the denominator. CONTRAST:  75mL OMNIPAQUE IOHEXOL 350 MG/ML SOLN COMPARISON:  CT head 10/30/2019 FINDINGS: CT HEAD FINDINGS Brain: Ill-defined hypodensity in the right medial temporal lobe lateral to the temporal horn has progressed since the CT of 10/30/2019. This is most consistent with subacute infarct. Mild atrophy. Negative for hydrocephalus. Patchy will chronic microvascular ischemic change in the white matter. Chronic lacunar infarction right caudate unchanged. Negative for intracranial hemorrhage or mass. Vascular: Negative for hyperdense vessel Skull: Negative Sinuses: Paranasal sinuses clear. Orbits: Negative Review of the MIP images confirms the above findings CTA NECK FINDINGS Aortic arch: Advanced atherosclerotic disease in the aortic arch and proximal great vessels. 50% diameter stenosis proximal left subclavian artery. Right carotid system: Atherosclerotic disease throughout the right common carotid artery extending to the carotid bifurcation and internal carotid artery. No significant stenosis. Left carotid system:  Atherosclerotic disease left carotid bifurcation. Noncalcified focal stenosis proximal left internal carotid artery estimated 80% diameter stenosis. Vertebral arteries: Right vertebral artery is patent to the basilar  without significant stenosis. Left vertebral artery is occluded at the origin with reconstitution at the skull base. Skeleton: No acute skeletal abnormality. Other neck: Negative Upper chest: Lung apices clear bilaterally. Review of the MIP images confirms the above findings CTA HEAD FINDINGS Anterior circulation: Atherosclerotic disease throughout the cavernous carotid bilaterally with mild to moderate stenosis. Anterior and middle cerebral arteries are patent without large vessel occlusion. There is diffuse irregularity and atherosclerotic disease in the anterior and middle cerebral arteries without focal flow limiting stenosis. Posterior circulation: Right vertebral artery supplies the basilar with atherosclerotic irregularity distally but no significant stenosis. There is opacification distal left vertebral artery which is diffusely diseased. This is likely retrograde flow. PICA patent bilaterally. Diffuse atherosclerotic irregularity in the basilar with mild stenosis in the midportion. Superior cerebellar arteries are patent bilaterally. Both posterior cerebral artery patent with diffuse atherosclerotic irregularity and moderate stenosis. Venous sinuses: Normal venous enhancement Anatomic variants: None Review of the MIP images confirms the above findings IMPRESSION: 1. Ill-defined hypodensity right medial temporal lobe has progressed since the recent CT of 10/30/2019 and most consistent with subacute infarct. Recommend MRI head for further evaluation. 2. Occlusion left vertebral artery at the origin. Chronicity of this occlusion is uncertain. Right vertebral artery widely patent to the basilar. 3. 80% focal noncalcified stenosis proximal left internal carotid artery. 4. Atherosclerotic disease throughout the right carotid artery without significant stenosis. 5. Diffuse intracranial atherosclerotic disease most severe in the posterior cerebral arteries bilaterally. Electronically Signed: By: Marlan Palau M.D.  On: 11/07/2019 08:27   MR BRAIN WO CONTRAST  Addendum Date: 11/07/2019   ADDENDUM REPORT: 11/07/2019 09:10 ADDENDUM: These results were called by telephone at the time of interpretation on 11/07/2019 at 9:09 am to provider Shanker, who verbally acknowledged these results. Electronically Signed   By: Marlan Palau M.D.   On: 11/07/2019 09:10   Result Date: 11/07/2019 CLINICAL DATA:  Acute neuro deficit. Stroke. Hypertension and diabetes. EXAM: MRI HEAD WITHOUT CONTRAST TECHNIQUE: Multiplanar, multiecho pulse sequences of the brain and surrounding structures were obtained without intravenous contrast. COMPARISON:  CT angio head and neck 11/07/2019 FINDINGS: Brain: Restricted diffusion in the right hippocampus compatible with acute/subacute infarct. This corresponds to the CT hypodensity. No associated hemorrhage. No other areas of restricted diffusion. Patchy white matter chronic ischemic changes are noted. There is facilitated diffusion in the left parietal infarct which may be late subacute or chronic. Ventricle size normal. Cerebral volume normal for age. Negative for mass lesion. Chronic microhemorrhage right cerebellum. Mild chronic hemorrhage in the left parietal infarct. Vascular: Normal arterial flow voids. Skull and upper cervical spine: Negative Sinuses/Orbits: Paranasal sinuses clear.  Negative orbit. Other: None IMPRESSION: Acute or subacute infarct in the right hippocampus without hemorrhage Mild chronic ischemic changes. Areas of chronic microhemorrhage including in the left parietal infarct which is small. Electronically Signed: By: Marlan Palau M.D. On: 11/07/2019 08:52   DG Abd 2 Views  Result Date: 11/07/2019 CLINICAL DATA:  3 day history of nausea and vomiting. EXAM: ABDOMEN - 2 VIEW COMPARISON:  No comparison studies available. FINDINGS: Gas-filled central small bowel loops are nondilated. Air in stool are seen scattered along the length of a nondilated colon. Atelectasis or  scarring noted at the right base. Telemetry leads overlie the abdomen. IMPRESSION: 1. Nonobstructive bowel gas pattern. 2. Atelectasis or scarring  at the right lung base. Electronically Signed   By: Kennith Center M.D.   On: 11/07/2019 07:35   ECHOCARDIOGRAM COMPLETE BUBBLE STUDY  Result Date: 11/07/2019    ECHOCARDIOGRAM REPORT   Patient Name:   Chelsea [name] Date of Exam: 11/07/2019 Medical Rec #:  [id number]      Height:       67.0 in Accession #:    [id number]     Weight:       141.1 lb Date of Birth:  07-12-1945      BSA:          1.744 m Patient Age:    74 years       BP:           199/78 mmHg Patient Gender: F              HR:           59 bpm. Exam Location:  Inpatient Procedure: 2D Echo, Cardiac Doppler, Color Doppler and Saline Contrast Bubble            Study Indications:    Stroke 434.91 / I163.9  History:        Patient has no prior history of Echocardiogram examinations.                 Risk Factors:Hypertension and Diabetes. Cardiac Arrest.  Sonographer:    Tiffany Dance Referring Phys: [id number] Deno Lunger [place] IMPRESSIONS  1. Left ventricular ejection fraction, by estimation, is 60 to 65%. The left ventricle has normal function. The left ventricle has no regional wall motion abnormalities. There is mild asymmetric left ventricular hypertrophy of the basal-septal segment. Left ventricular diastolic parameters are consistent with Grade I diastolic dysfunction (impaired relaxation). Elevated left ventricular end-diastolic pressure.  2. Right ventricular systolic function is normal. The right ventricular size is normal.  3. Left atrial size was moderately dilated.  4. The mitral valve is normal in structure. Trivial mitral valve regurgitation. No evidence of mitral stenosis. Moderate mitral annular calcification.  5. The aortic valve is tricuspid. There is mild calcification of the aortic valve. There is mild thickening of the aortic valve. Aortic valve regurgitation is not visualized. Mild aortic  valve sclerosis is present, with no evidence of aortic valve stenosis.  6. The inferior vena cava is normal in size with greater than 50% respiratory variability, suggesting right atrial pressure of 3 mmHg.  7. Agitated saline contrast bubble study was negative, with no evidence of any interatrial shunt. Comparison(s): No prior Echocardiogram. Conclusion(s)/Recommendation(s): Normal biventricular function without evidence of hemodynamically significant valvular heart disease. No intracardiac source of embolism detected on this transthoracic study. A transesophageal echocardiogram is recommended to exclude cardiac source of embolism if clinically indicated. FINDINGS  Left Ventricle: Left ventricular ejection fraction, by estimation, is 60 to 65%. The left ventricle has normal function. The left ventricle has no regional wall motion abnormalities. The left ventricular internal cavity size was normal in size. There is  mild asymmetric left ventricular hypertrophy of the basal-septal segment. Left ventricular diastolic parameters are consistent with Grade I diastolic dysfunction (impaired relaxation). Elevated left ventricular end-diastolic pressure. Right Ventricle: The right ventricular size is normal. No increase in right ventricular wall thickness. Right ventricular systolic function is normal. Left Atrium: Left atrial size was moderately dilated. Right Atrium: Right atrial size was normal in size. Pericardium: There is no evidence of pericardial effusion. Mitral Valve: The mitral valve is normal in structure. Moderate mitral annular calcification.  Trivial mitral valve regurgitation. No evidence of mitral valve stenosis. Tricuspid Valve: The tricuspid valve is normal in structure. Tricuspid valve regurgitation is trivial. No evidence of tricuspid stenosis. Aortic Valve: The aortic valve is tricuspid. There is mild calcification of the aortic valve. There is mild thickening of the aortic valve. Aortic valve  regurgitation is not visualized. Mild aortic valve sclerosis is present, with no evidence of aortic valve stenosis. Pulmonic Valve: The pulmonic valve was not well visualized. Pulmonic valve regurgitation is mild. No evidence of pulmonic stenosis. Aorta: The aortic root, ascending aorta and aortic arch are all structurally normal, with no evidence of dilitation or obstruction. Venous: The inferior vena cava is normal in size with greater than 50% respiratory variability, suggesting right atrial pressure of 3 mmHg. IAS/Shunts: No atrial level shunt detected by color flow Doppler. Agitated saline contrast was given intravenously to evaluate for intracardiac shunting. Agitated saline contrast bubble study was negative, with no evidence of any interatrial shunt.  LEFT VENTRICLE PLAX 2D LVIDd:         4.49 cm  Diastology LVIDs:         3.36 cm  LV e' medial:    3.92 cm/s LV PW:         0.97 cm  LV E/e' medial:  17.0 LV IVS:        1.41 cm  LV e' lateral:   4.90 cm/s LVOT diam:     2.30 cm  LV E/e' lateral: 13.6 LV SV:         107 LV SV Index:   61 LVOT Area:     4.15 cm  RIGHT VENTRICLE             IVC RV Basal diam:  2.89 cm     IVC diam: 1.60 cm RV S prime:     12.50 cm/s TAPSE (M-mode): 2.6 cm LEFT ATRIUM             Index       RIGHT ATRIUM           Index LA diam:        3.90 cm 2.24 cm/m  RA Area:     14.80 cm LA Vol (A2C):   81.5 ml 46.74 ml/m RA Volume:   38.20 ml  21.91 ml/m LA Vol (A4C):   60.2 ml 34.53 ml/m LA Biplane Vol: 70.3 ml 40.32 ml/m  AORTIC VALVE LVOT Vmax:   94.45 cm/s LVOT Vmean:  64.600 cm/s LVOT VTI:    0.258 m  AORTA Ao Root diam: 3.70 cm Ao Asc diam:  3.40 cm MITRAL VALVE MV Area (PHT): 1.87 cm     SHUNTS MV Decel Time: 405 msec     Systemic VTI:  0.26 m MV E velocity: 66.50 cm/s   Systemic Diam: 2.30 cm MV A velocity: 114.00 cm/s MV E/A ratio:  0.58 Jodelle RedBridgette Christopher MD Electronically signed by Jodelle RedBridgette Christopher MD Signature Date/Time: 11/07/2019/3:25:37 PM    Final      Assessment/Plan: Diagnosis: Right hippocampus infarct Stroke: Continue secondary stroke prophylaxis and Risk Factor Modification listed below:   Antiplatelet therapy:   Blood Pressure Management:  Continue current medication with prn's with permisive HTN per primary team Statin Agent:   Diabetes management:   Tobacco abuse:   PT/OT for mobility, ADL training  Motor recovery: Zoloft Labs independently reviewed.  Records reviewed and summated above.  1. Does the need for close, 24 hr/day medical supervision in concert with the patient's  rehab needs make it unreasonable for this patient to be served in a less intensive setting? Yes  2. Co-Morbidities requiring supervision/potential complications: HTN (monitor and provide prns in accordance with increased physical exertion and pain), DM with hyperglycemia (Monitor in accordance with exercise and adjust meds as necessary), left carotid stenosis (was scheduled for carotid enterectomy),  tobacco abuse (counsel), blindness left eye, medical noncompliance, leukocytosis (repeat labs, cont to monitor for signs and symptoms of infection, further workup if indicated), AKI (avoid nephrotoxic meds.  Encourage fluids, repeat labs), postherpetic neuralgia (optimize neuropathic pain medications).   3. Due to safety, disease management, medication administration and patient education, does the patient require 24 hr/day rehab nursing? Yes 4. Does the patient require coordinated care of a physician, rehab nurse, therapy disciplines of PT/OT to address physical and functional deficits in the context of the above medical diagnosis(es)? Yes Addressing deficits in the following areas: balance, endurance, locomotion, strength, transferring, bathing, dressing, toileting and psychosocial support 5. Can the patient actively participate in an intensive therapy program of at least 3 hrs of therapy per day at least 5 days per week? Yes 6. The potential for patient to make  measurable gains while on inpatient rehab is excellent 7. Anticipated functional outcomes upon discharge from inpatient rehab are min assist and mod assist  with PT, min assist and mod assist with OT, n/a with SLP. 8. Estimated rehab length of stay to reach the above functional goals is: 10-14 days. 9. Anticipated discharge destination: Home 10. Overall Rehab/Functional Prognosis: good and fair  RECOMMENDATIONS: This patient's condition is appropriate for continued rehabilitative care in the following setting: CIR Patient has agreed to participate in recommended program. Yes Note that insurance prior authorization may be required for reimbursement for recommended care.  Comment: Rehab Admissions Coordinator to follow up.  I have personally performed a face to face diagnostic evaluation, including, but not limited to relevant history and physical exam findings, of this patient and developed relevant assessment and plan.  Additionally, I have reviewed and concur with the physician assistant's documentation above.   Maryla Morrow, MD, ABPMR Mcarthur Rossetti Angiulli, PA-C 11/09/2019

## 2019-11-09 NOTE — Progress Notes (Signed)
Physical Therapy Treatment Patient Details Name: Chelsea [name] MRN: [id number] DOB: 1945-12-08 Today's Date: 11/09/2019    History of Present Illness 74 y/o female presenting with 1 week history of frequent falls, subtle right lower extremity weakness, L visual deficits, nausea and vomiting. MRI shows R hippocampus infarct. PMH includes HTN (uncontrolled, has not seen an MD in 15 years), DM-2, left carotid artery stenosis    PT Comments    Pt motivated to participate in PT. Pt overall requiring min-mod +2 assist this day, most notably during gait as pt with posterior LOB and R knee buckling intermittently. Pt limited in gait distance by nausea and dry heaving, no vomit observed. PT encouraged pt to be OOB in recliner ~2 hours to promote BP regulation with positional changes (BP 120s/60s while sitting EOB), to increase activity tolerance/waking hours, and for pulmonary health, pt agreeable. Will continue to follow acutely.     Follow Up Recommendations  CIR     Equipment Recommendations  Other (comment) (TBD)    Recommendations for Other Services       Precautions / Restrictions Precautions Precautions: Fall Restrictions Weight Bearing Restrictions: No    Mobility  Bed Mobility Overal bed mobility: Needs Assistance Bed Mobility: Supine to Sit     Supine to sit: Min guard     General bed mobility comments: Min guard for safety, no physical assist needed.  Transfers Overall transfer level: Needs assistance Equipment used: Rolling walker (2 wheeled) Transfers: Sit to/from Stand Sit to Stand: Min assist;+2 physical assistance         General transfer comment: min +2 for power up, correcting posterior bias.  Ambulation/Gait Ambulation/Gait assistance: Mod assist;+2 safety/equipment Gait Distance (Feet): 10 Feet Assistive device: Rolling walker (2 wheeled) Gait Pattern/deviations: Step-through pattern;Decreased stride length;Decreased weight shift to right;Decreased  stance time - right;Trunk flexed Gait velocity: decr   General Gait Details: Mod assist to steady, navigate RW, correct posterior LOB x1. Very increased time, + R knee buckling observed.   Stairs             Wheelchair Mobility    Modified Rankin (Stroke Patients Only) Modified Rankin (Stroke Patients Only) Pre-Morbid Rankin Score: Slight disability Modified Rankin: Moderately severe disability     Balance Overall balance assessment: Needs assistance Sitting-balance support: Bilateral upper extremity supported;Feet supported Sitting balance-Leahy Scale: Fair       Standing balance-Leahy Scale: Poor Standing balance comment: reliant on external support                            Cognition Arousal/Alertness: Awake/alert Behavior During Therapy: Anxious Overall Cognitive Status: Impaired/Different from baseline Area of Impairment: Following commands;Safety/judgement;Problem solving                       Following Commands: Follows one step commands with increased time Safety/Judgement: Decreased awareness of safety;Decreased awareness of deficits   Problem Solving: Decreased initiation;Difficulty sequencing;Requires tactile cues;Requires verbal cues General Comments: Difficulty following commands during vision assessment, requires repeated cuing for "eyes on my nose" during field cut testing. Continued L inattention, frequent cues for attending to L. Requires max verbal and tactile cuing during mobility.      Exercises      General Comments General comments (skin integrity, edema, etc.): L lateral visual field cut, L inattention, nausea and dry heaving post-gait      Pertinent Vitals/Pain Pain Assessment: Faces Faces Pain Scale: Hurts little more  Pain Location: shoulders, abdomen Pain Descriptors / Indicators: Sore;Discomfort;Other (Comment) (nausea) Pain Intervention(s): Limited activity within patient's tolerance;Monitored during  session;Repositioned;Premedicated before session (requested pre-med with anti-nausea med)    Home Living                      Prior Function            PT Goals (current goals can now be found in the care plan section) Acute Rehab PT Goals Patient Stated Goal: return to independence PT Goal Formulation: With patient Time For Goal Achievement: 11/21/19 Potential to Achieve Goals: Good Progress towards PT goals: Progressing toward goals    Frequency    Min 4X/week      PT Plan Current plan remains appropriate    Co-evaluation              AM-PAC PT "6 Clicks" Mobility   Outcome Measure  Help needed turning from your back to your side while in a flat bed without using bedrails?: A Little Help needed moving from lying on your back to sitting on the side of a flat bed without using bedrails?: A Little Help needed moving to and from a bed to a chair (including a wheelchair)?: A Lot Help needed standing up from a chair using your arms (e.g., wheelchair or bedside chair)?: A Lot Help needed to walk in hospital room?: A Lot Help needed climbing 3-5 steps with a railing? : A Lot 6 Click Score: 14    End of Session Equipment Utilized During Treatment: Gait belt Activity Tolerance: Patient limited by fatigue;Treatment limited secondary to medical complications (Comment) (dizziness, N/V) Patient left: with call bell/phone within reach;with family/visitor present;in chair;with chair alarm set;with nursing/sitter in room (on stretcher in ED ) Nurse Communication: Mobility status PT Visit Diagnosis: Unsteadiness on feet (R26.81);Muscle weakness (generalized) (M62.81)     Time: 6270-3500 PT Time Calculation (min) (ACUTE ONLY): 18 min  Charges:  $Therapeutic Activity: 8-22 mins                     Hunner Garcon E, PT Acute Rehabilitation Services Pager [phone]  Office [phone]    Tyrone Apple D Despina Hidden 11/09/2019, 4:25 PM

## 2019-11-09 NOTE — Progress Notes (Signed)
Called by RN. Pt with lower abdominal pressure and distension. Bladder scan shows over 600 cc in bladder.  RN attempted to place foley or in and out cath. Unable to pass catheter. Multiple attempts by different nurses attempted but not successful.  Urology called, Dr. Benancio Deeds, who is coming in to assess patient and place foley.

## 2019-11-09 NOTE — Progress Notes (Signed)
Raechel Ache, OT  Rehab Admission Coordinator  Physical Medicine and Rehabilitation  PMR Pre-admission      Signed  Date of Service:  11/09/2019  2:12 PM      Related encounter: ED to Hosp-Admission (Current) from 11/07/2019 in [facility]      Signed       PMR Admission Coordinator Pre-Admission Assessment   Patient: Chelsea [name] is an 74 y.o., female MRN: [id number] DOB: 15-Feb-1945 Height:   Weight:                                                                                                                                                    Insurance Information HMO:     PPO: yes     PCP:      IPA:      80/20:      OTHER: PRIMARY: HTA    Policy#: [id number]      Subscriber: patient CM Name: Lynelle Smoke      Phone#: [phone]     Fax#: [phone] Pre-Cert#: [id number]      Employer:  [employer]auf[employer] provided by Lynelle Smoke for admit to CIR. Pt is approved for 7 days. Auth [id number]. (Has epic access).  Benefits:  Phone #: [phone]     Name: [name]. Date: 01/12/2016-01/11/2020     Deduct: no deductible ($0)      Out of Pocket Max: $3,400 ($255 met)      Life Max: NA  CIR: $295/day co-pay for days 1-6, $0/day co-pay for days 7-90      SNF: $0/day co-pay for days 1-20; $178/day co-pay for days 21-100 Outpatient: $15/visit co-pay; limited by medical necessity    Home Health: 100% coverage, 0% co-insurance, $0 co-pay; limited by medical necessity       DME: 80% coverage,      Co-Pay: 20% co-insurance Providers:  SECONDARY: NA      Policy#:       Phone#:    Development worker, community:       Phone#:    The "Data Collection Information Summary" for patients in Inpatient Rehabilitation Facilities with attached "Privacy Act [place] Records" was provided and verbally reviewed with: Patient and Family   Emergency Contact Information         Contact Information     Name Relation Home Work Mobile    [facility] Daughter [phone]        Lear Ng  Daughter     [phone]       Current Medical History  Patient Admitting Diagnosis: Right hippocampus infarct   History of Present Illness: Diamonds Lippard. Dillow is a 74 year old right-handed female with history of hypertension, diabetes mellitus, left carotid stenosis 80% was scheduled for carotid enterectomy in late November with Dr. Donnetta Hutching, tobacco abuse,  medical noncompliance.  Per chart review independent with  assistive device prior to admission.  1 level home 4 steps to entry.  She lives with her children and assistance as needed.  Presented 11/07/2019 with intractable nausea and vomiting as well as loss of vision left peripheral field.  Noted blood pressure 216/79.  Patient did have a CT of the head 10/30/2019 after a fall showing no evidence of acute changes.  MRI showed acute versus subacute infarct in the right hyper campus without hemorrhage.  Mild chronic ischemic changes.  CT angiogram of the head and neck ill-defined hypodensity right medial temporal lobe progressed since recent CT of 10/30/2019 most consistent with subacute infarction.  Occlusion left vertebral artery at the origin 80% focal noncalcified stenosis proximal left ICA artery.  Admission chemistries unremarkable except glucose 345 BUN 26 urine culture multiple species troponin negative hemoglobin A1c 10.1.  Echocardiogram with ejection fraction of 60 to 65% no wall motion abnormalities grade 1 diastolic dysfunction.  Maintained on aspirin and Plavix for CVA prophylaxis x3 months then Plavix alone.  Subcutaneous Lovenox for DVT prophylaxis.  Hospital course urinary retention urology consulted Foley catheter tube was placed 11/09/2019 with plans for a voiding trial as patient's mobility improved.  Tolerating a regular diet.  Therapy evaluations completed and patient is to be admitted for a comprehensive rehab program on 11/09/19.   Complete NIHSS TOTAL: 0 Glasgow Coma Scale Score: 15   Past Medical History      Past Medical History:   Diagnosis Date  . Blind left eye    . Cardiac arrest ([place])    . Diabetes mellitus without complication ([place])    . Hypertension    . Stroke [facility])        Family History  family history includes Heart attack in her mother; Stroke in her father.   Prior Rehab/Hospitalizations:  Has the patient had prior rehab or hospitalizations prior to admission? Yes   Has the patient had major surgery during 100 days prior to admission? No   Current Medications    Current Facility-Administered Medications:  .  acetaminophen (TYLENOL) tablet 650 mg, 650 mg, Oral, Q6H PRN **OR** acetaminophen (TYLENOL) suppository 650 mg, 650 mg, Rectal, Q6H PRN, Shalhoub, Sherryll Burger, MD .  aspirin EC tablet 81 mg, 81 mg, Oral, Daily, Garvin Fila, MD, 81 mg at 11/09/19 0809 .  atorvastatin (LIPITOR) tablet 80 mg, 80 mg, Oral, Daily, Ghimire, Henreitta Leber, MD, 80 mg at 11/09/19 0810 .  carvedilol (COREG) tablet 6.25 mg, 6.25 mg, Oral, BID WC, Ghimire, Henreitta Leber, MD, 6.25 mg at 11/09/19 1617 .  Chlorhexidine Gluconate Cloth 2 % PADS 6 each, 6 each, Topical, Daily, Chotiner, Yevonne Aline, MD, 6 each at 11/09/19 0810 .  clopidogrel (PLAVIX) tablet 75 mg, 75 mg, Oral, Daily, Garvin Fila, MD, 75 mg at 11/09/19 0809 .  diphenhydrAMINE (BENADRYL) injection 12.5 mg, 12.5 mg, Intravenous, Q8H PRN, Jonetta Osgood, MD, 12.5 mg at 11/07/19 1630 .  enoxaparin (LOVENOX) injection 40 mg, 40 mg, Subcutaneous, Daily, Shalhoub, Sherryll Burger, MD, 40 mg at 11/09/19 0808 .  hydrALAZINE (APRESOLINE) injection 10 mg, 10 mg, Intravenous, Q6H PRN, Shalhoub, Sherryll Burger, MD, 10 mg at 11/08/19 1641 .  insulin aspart (novoLOG) injection 0-15 Units, 0-15 Units, Subcutaneous, TID AC & HS, Shalhoub, Sherryll Burger, MD, 2 Units at 11/09/19 1611 .  lactated ringers infusion, , Intravenous, Continuous, Ghimire, Henreitta Leber, MD, Stopped at 11/08/19 1230 .  ondansetron (ZOFRAN) tablet 4 mg, 4 mg, Oral, Q6H PRN **OR** ondansetron (ZOFRAN) injection 4 mg,  4 mg,  Intravenous, Q6H PRN, Shalhoub, Sherryll Burger, MD, 4 mg at 11/09/19 1137 .  polyethylene glycol (MIRALAX / GLYCOLAX) packet 17 g, 17 g, Oral, Daily PRN, Shalhoub, Sherryll Burger, MD .  pregabalin (LYRICA) capsule 25 mg, 25 mg, Oral, QPM, Ghimire, Henreitta Leber, MD, 25 mg at 11/08/19 1827 .  promethazine (PHENERGAN) injection 12.5 mg, 12.5 mg, Intravenous, Q6H PRN, Ghimire, Henreitta Leber, MD, 12.5 mg at 11/08/19 1604   Patients Current Diet:     Diet Order                      Diet - low sodium heart healthy              Diet Carb Modified              Diet regular Room service appropriate? Yes; Fluid consistency: Thin  Diet effective now                      Precautions / Restrictions Precautions Precautions: Fall Restrictions Weight Bearing Restrictions: No    Has the patient had 2 or more falls or a fall with injury in the past year?Yes   Prior Activity Level Household: stayed in mostly due to instability from RLE pain and COVID pandemic   Prior Functional Level Prior Function Level of Independence: Independent with assistive device(s) Comments: was independent without AD until last week- then began using RW   Self Care: Did the patient need help bathing, dressing, using the toilet or eating?  Needed some help   Indoor Mobility: Did the patient need assistance with walking from room to room (with or without device)? Needed some help   Stairs: Did the patient need assistance with internal or external stairs (with or without device)? Needed some help   Functional Cognition: Did the patient need help planning regular tasks such as shopping or remembering to take medications? Needed some help   Home Assistive Devices / Equipment Home Assistive Devices/Equipment: None Home Equipment: Shower seat, [place] 2 wheels, Other (comment)   Prior Device Use: Indicate devices/aids used by the patient prior to current illness, exacerbation or injury? Walker   Current Functional Level Cognition    Overall Cognitive Status: Impaired/Different from baseline Orientation Level: Oriented X4 Following Commands: Follows one step commands with increased time Safety/Judgement: Decreased awareness of safety, Decreased awareness of deficits General Comments: Difficulty following commands during vision assessment, requires repeated cuing for "eyes on my nose" during field cut testing. Continued L inattention, frequent cues for attending to L. Requires max verbal and tactile cuing during mobility.    Extremity Assessment (includes Sensation/Coordination)   Upper Extremity Assessment: Generalized weakness, LUE deficits/detail LUE Deficits / Details: baseline L posterior shoulder pain  LUE: Unable to fully assess due to pain  Lower Extremity Assessment: Defer to PT evaluation RLE Deficits / Details: hip flexors at 3/5. was not able to hold against resistance. other muscle groups at grossly 4/5 throughout      ADLs   Overall ADL's : Needs assistance/impaired Eating/Feeding: Set up, Sitting Grooming: Set up, Sitting Upper Body Bathing: Minimal assistance, Sitting Lower Body Bathing: Minimal assistance, Sit to/from stand, Sitting/lateral leans Upper Body Dressing : Set up, Sitting Lower Body Dressing: Minimal assistance, Sit to/from stand, Sitting/lateral leans Toilet Transfer: Minimal assistance, Stand-pivot, BSC, RW Toileting- Clothing Manipulation and Hygiene: Minimal assistance, Sit to/from stand Functional mobility during ADLs: Minimal assistance, Moderate assistance, Rolling walker, Cueing for sequencing, Cueing for  safety General ADL Comments: Bed mobility and sat EOB several minutes close min guard level-light steadying A. Onset of n/v sitting EOB.      Mobility   Overal bed mobility: Needs Assistance Bed Mobility: Supine to Sit Rolling: Mod assist Sidelying to sit: Mod assist, +2 for safety/equipment Supine to sit: Min guard Sit to supine: Max assist, +2 for physical assistance, +2  for safety/equipment, HOB elevated General bed mobility comments: Min guard for safety, no physical assist needed.     Transfers   Overall transfer level: Needs assistance Equipment used: Rolling walker (2 wheeled) Transfers: Sit to/from Stand Sit to Stand: Min assist, +2 physical assistance General transfer comment: min +2 for power up, correcting posterior bias.     Ambulation / Gait / Stairs / Wheelchair Mobility   Ambulation/Gait Ambulation/Gait assistance: Mod assist, +2 safety/equipment Gait Distance (Feet): 10 Feet Assistive device: Rolling walker (2 wheeled) Gait Pattern/deviations: Step-through pattern, Decreased stride length, Decreased weight shift to right, Decreased stance time - right, Trunk flexed General Gait Details: Mod assist to steady, navigate RW, correct posterior LOB x1. Very increased time, + R knee buckling observed. Gait velocity: decr     Posture / Balance Dynamic Sitting Balance Sitting balance - Comments: fair to poor, with intermittent min assist required to maintain upright sitting. Head held in L lateral flexion and R rotation, minimally improved with cuing. EOB sitting x10 minutes Balance Overall balance assessment: Needs assistance Sitting-balance support: Bilateral upper extremity supported, Feet supported Sitting balance-Leahy Scale: Fair Sitting balance - Comments: fair to poor, with intermittent min assist required to maintain upright sitting. Head held in L lateral flexion and R rotation, minimally improved with cuing. EOB sitting x10 minutes Standing balance support: Bilateral upper extremity supported, During functional activity Standing balance-Leahy Scale: Poor Standing balance comment: reliant on external support     Special needs/care consideration Continuous Drip IV  lactated ringers infusion   Diabetic management : new DM diagnosis according to the patient.        Previous Home Environment (from acute therapy documentation) Living  Arrangements: Children Available Help at Discharge: Family, Available 24 hours/day Type of Home: House Home Layout: One level Home Access: Stairs to enter Entrance Stairs-Rails: Can reach both Entrance Stairs-Number of Steps: 4 Bathroom Shower/Tub: Chiropodist: Handicapped height Home Care Services: No Additional Comments: has a friend installing rails throughout bathroom; has adjustable bed; lift chair   Discharge Living Setting Plans for Discharge Living Setting: Patient's home, Lives with (comment) (lives with daughter Bing Quarry ) Type of Home at Discharge: House Discharge Home Layout: One level Discharge Home Access: Stairs to enter (open to ramp placement) Entrance Stairs-Rails: None Entrance Stairs-Number of Steps: 4 Discharge Bathroom Shower/Tub: Tub/shower unit Discharge Bathroom Toilet: Handicapped height Discharge Bathroom Accessibility: Yes How Accessible: Accessible via walker Does the patient have any problems obtaining your medications?: No   Social/Family/Support Systems Patient Roles: Other (Comment) (daughter lives with her and helps care for her) Contact Information: daughter: Koren Bound: [phone] Anticipated Caregiver: Koren Bound + other family members as needed Anticipated Caregiver's Contact Information: see above Ability/Limitations of Caregiver: Max A per Koren Bound Caregiver Availability: 24/7 Discharge Plan Discussed with Primary Caregiver: Yes Is Caregiver In Agreement with Plan?: Yes Does Caregiver/Family have Issues with Lodging/Transportation while Pt is in Rehab?: No     Goals Patient/Family Goal for Rehab: PT/OT: Min/Mod A; SLP: NA Expected length of stay: 10-14 days Cultural Considerations: NA Pt/Family Agrees to Admission and willing to participate: Yes Program Orientation  Provided & Reviewed with Pt/Caregiver Including Roles  & Responsibilities: Yes (with pt and her daughter )  Barriers to Discharge: Home environment  access/layout, New diabetic  Barriers to Discharge Comments: stairs to enter; will need education on new DM diagnosis     Decrease burden of Care through IP rehab admission: NA     Possible need for SNF placement upon discharge:Not anticipated. Pt has great social support from her daughter who lives with her and can provide for her 24/7.      Patient Condition: This patient's condition remains as documented in the consult dated 11/09/19, in which the Rehabilitation Physician determined and documented that the patient's condition is appropriate for intensive rehabilitative care in an inpatient rehabilitation facility. Will admit to inpatient rehab today.   Preadmission Screen Completed By:  Raechel Ache, OT, 11/09/2019 4:54 PM ______________________________________________________________________   Discussed status with Dr. Posey Pronto on 11/09/19 at 4:54PM and received approval for admission today.   Admission Coordinator:  Raechel Ache, time 4:54PM/Date 11/09/19             Cosigned by: Jamse Arn, MD at 11/09/2019  4:59 PM  Revision History                Note Details  Author Raechel Ache, OT File Time 11/09/2019  4:54 PM  Author Type Rehab Admission Coordinator Status Signed  Last Editor Raechel Ache, OT Service Physical Medicine and Rehabilitation

## 2019-11-09 NOTE — Progress Notes (Signed)
10/28 2300 - paged Dr. Rachael Darby due to pt unable to void "all day". Bladder scan shows >657ml. Dr Rachael Darby agreeable to start foley due to frequent I/O's and continuous urinary retention since admission.  10/29 0000 - Myself and several other RN's attempted foley 5 different times, also attempted I/O with no success. Paged Dr. Rachael Darby who is agreeable to speak with urology for further advice.  10/29 0010 - Dr. Rachael Darby calls back, "Dr. Benancio Deeds is on the way to assess the patient and attempt the foley.  Please have supplies ready."  10/29 0025 - Dr Benancio Deeds successfully placed 10fr standard foley. Pt tolerating well. >1231ml urine returned initially. Pt reports relief and bladder is now soft, nontender and non distended.

## 2019-11-09 NOTE — PMR Pre-admission (Signed)
PMR Admission Coordinator Pre-Admission Assessment  Patient: Chelsea [name] is an 74 y.o., female MRN: [id number] DOB: [birth year] Height:   Weight:                Insurance Information HMO:     PPO: yes     PCP:      IPA:      80/20:      OTHER: PRIMARY: HTA    Policy#: [id number]      Subscriber: patient CM Name: Chelsea [name]      Phone#: [phone]     Fax#: [phone] Pre-Cert#: [id number]      Employer:  Chelsea [name] provided by Chelsea [name] for admit to CIR. Pt is approved for 7 days. Auth [id number]. (Has epic access).  Benefits:  Phone #: [phone]     Name: Chelsea [name]. Date: 01/12/2016-01/11/2020     Deduct: no deductible ($0)      Out of Pocket Max: $3,400 ($255 met)      Life Max: NA  CIR: $295/day co-pay for days 1-6, $0/day co-pay for days 7-90      SNF: $0/day co-pay for days 1-20; $178/day co-pay for days 21-100 Outpatient: $15/visit co-pay; limited by medical necessity    Home Health: 100% coverage, 0% co-insurance, $0 co-pay; limited by medical necessity       DME: 80% coverage,      Co-Pay: 20% co-insurance Providers:  SECONDARY: NA      Policy#:       Phone#:   Development worker, community:       Phone#:   [facility] [employer] for patients in Inpatient Rehabilitation Facilities with attached Privacy Act [place] Records was provided and verbally reviewed with: Patient and Family  Emergency Contact Information Contact Information    Name Relation Home Work Mobile   Chelsea [name] Daughter [phone]     Chelsea [name] Daughter   [phone]     Current Medical History  Patient Admitting Diagnosis: Right hippocampus infarct  History of Present Illness: Chelsea [name]. [name] is a 74 year old right-handed female with history of hypertension, diabetes mellitus, left carotid stenosis 80% was scheduled for carotid enterectomy in late November with Dr. Donnetta Hutching, tobacco abuse,  medical noncompliance.  Per chart review independent with assistive device prior to  admission.  1 level home 4 steps to entry.  She lives with her children and assistance as needed.  Presented 11/07/2019 with intractable nausea and vomiting as well as loss of vision left peripheral field.  Noted blood pressure 216/79.  Patient did have a CT of the head 10/30/2019 after a fall showing no evidence of acute changes.  MRI showed acute versus subacute infarct in the right hyper campus without hemorrhage.  Mild chronic ischemic changes.  CT angiogram of the head and neck ill-defined hypodensity right medial temporal lobe progressed since recent CT of 10/30/2019 most consistent with subacute infarction.  Occlusion left vertebral artery at the origin 80% focal noncalcified stenosis proximal left ICA artery.  Admission chemistries unremarkable except glucose 345 BUN 26 urine culture multiple species troponin negative hemoglobin A1c 10.1.  Echocardiogram with ejection fraction of 60 to 65% no wall motion abnormalities grade 1 diastolic dysfunction.  Maintained on aspirin and Plavix for CVA prophylaxis x3 months then Plavix alone.  Subcutaneous Lovenox for DVT prophylaxis.  Hospital course urinary retention urology consulted Foley catheter tube was placed 11/09/2019 with plans for a voiding trial as patient's mobility improved.  Tolerating a regular diet.  Therapy evaluations completed and patient is to  be admitted for a comprehensive rehab program on 11/09/19.  Complete NIHSS TOTAL: 0 Glasgow Coma Scale Score: 15  Past Medical History  Past Medical History:  Diagnosis Date   Blind left eye    Cardiac arrest ([place])    Diabetes mellitus without complication ([place])    Hypertension    Stroke ([place])     Family History  family history includes Heart attack in her mother; Stroke in her father.  Prior Rehab/Hospitalizations:  Has the patient had prior rehab or hospitalizations prior to admission? Yes  Has the patient had major surgery during 100 days prior to admission? No  Current  Medications   Current Facility-Administered Medications:    acetaminophen (TYLENOL) tablet 650 mg, 650 mg, Oral, Q6H PRN **OR** acetaminophen (TYLENOL) suppository 650 mg, 650 mg, Rectal, Q6H PRN, Shalhoub, Sherryll Burger, MD   aspirin EC tablet 81 mg, 81 mg, Oral, Daily, [name], Pramod S, MD, 81 mg at 11/09/19 0809   atorvastatin (LIPITOR) tablet 80 mg, 80 mg, Oral, Daily, Ghimire, Shanker M, MD, 80 mg at 11/09/19 0810   carvedilol (COREG) tablet 6.25 mg, 6.25 mg, Oral, BID WC, Ghimire, Shanker M, MD, 6.25 mg at 11/09/19 1617   Chlorhexidine Gluconate Cloth 2 % PADS 6 each, 6 each, Topical, Daily, Chotiner, Yevonne Aline, MD, 6 each at 11/09/19 0810   clopidogrel (PLAVIX) tablet 75 mg, 75 mg, Oral, Daily, [name], Pramod S, MD, 75 mg at 11/09/19 0809   diphenhydrAMINE (BENADRYL) injection 12.5 mg, 12.5 mg, Intravenous, Q8H PRN, Ghimire, Shanker M, MD, 12.5 mg at 11/07/19 1630   enoxaparin (LOVENOX) injection 40 mg, 40 mg, Subcutaneous, Daily, Shalhoub, Sherryll Burger, MD, 40 mg at 11/09/19 8921   hydrALAZINE (APRESOLINE) injection 10 mg, 10 mg, Intravenous, Q6H PRN, Shalhoub, Sherryll Burger, MD, 10 mg at 11/08/19 1641   insulin aspart (novoLOG) injection 0-15 Units, 0-15 Units, Subcutaneous, TID AC & HS, Shalhoub, Sherryll Burger, MD, 2 Units at 11/09/19 1611   lactated ringers infusion, , Intravenous, Continuous, Ghimire, Henreitta Leber, MD, Stopped at 11/08/19 1230   ondansetron (ZOFRAN) tablet 4 mg, 4 mg, Oral, Q6H PRN **OR** ondansetron (ZOFRAN) injection 4 mg, 4 mg, Intravenous, Q6H PRN, Shalhoub, Sherryll Burger, MD, 4 mg at 11/09/19 1137   polyethylene glycol (MIRALAX / GLYCOLAX) packet 17 g, 17 g, Oral, Daily PRN, Shalhoub, Sherryll Burger, MD   pregabalin (LYRICA) capsule 25 mg, 25 mg, Oral, QPM, Ghimire, Shanker M, MD, 25 mg at 11/08/19 1827   promethazine (PHENERGAN) injection 12.5 mg, 12.5 mg, Intravenous, Q6H PRN, Jonetta Osgood, MD, 12.5 mg at 11/08/19 1604  Patients Current Diet:  Diet Order            Diet -  low sodium heart healthy           Diet Carb Modified           Diet regular Room service appropriate? Yes; Fluid consistency: Thin  Diet effective now                 Precautions / Restrictions Precautions Precautions: Fall Restrictions Weight Bearing Restrictions: No   Has the patient had 2 or more falls or a fall with injury in the past year?Yes  Prior Activity Level Household: stayed in mostly due to instability from RLE pain and COVID pandemic  Prior Functional Level Prior Function Level of Independence: Independent with assistive device(s) Comments: was independent without AD until last week- then began using RW  Self Care: Did the patient need help bathing,  dressing, using the toilet or eating?  Needed some help  Indoor Mobility: Did the patient need assistance with walking from room to room (with or without device)? Needed some help  Stairs: Did the patient need assistance with internal or external stairs (with or without device)? Needed some help  Functional Cognition: Did the patient need help planning regular tasks such as shopping or remembering to take medications? Needed some help  Home Assistive Devices / Equipment Home Assistive Devices/Equipment: None Home Equipment: Shower seat, [place] 2 wheels, Other (comment)  Prior Device Use: Indicate devices/aids used by the patient prior to current illness, exacerbation or injury? Walker  Current Functional Level Cognition  Overall Cognitive Status: Impaired/Different from baseline Orientation Level: Oriented X4 Following Commands: Follows one step commands with increased time Safety/Judgement: Decreased awareness of safety, Decreased awareness of deficits General Comments: Difficulty following commands during vision assessment, requires repeated cuing for "eyes on my nose" during field cut testing. Continued L inattention, frequent cues for attending to L. Requires max verbal and tactile cuing during  mobility.    Extremity Assessment (includes Sensation/Coordination)  Upper Extremity Assessment: Generalized weakness, LUE deficits/detail LUE Deficits / Details: baseline L posterior shoulder pain  LUE: Unable to fully assess due to pain  Lower Extremity Assessment: Defer to PT evaluation RLE Deficits / Details: hip flexors at 3/5. was not able to hold against resistance. other muscle groups at grossly 4/5 throughout     ADLs  Overall ADL's : Needs assistance/impaired Eating/Feeding: Set up, Sitting Grooming: Set up, Sitting Upper Body Bathing: Minimal assistance, Sitting Lower Body Bathing: Minimal assistance, Sit to/from stand, Sitting/lateral leans Upper Body Dressing : Set up, Sitting Lower Body Dressing: Minimal assistance, Sit to/from stand, Sitting/lateral leans Toilet Transfer: Minimal assistance, Stand-pivot, BSC, RW Toileting- Clothing Manipulation and Hygiene: Minimal assistance, Sit to/from stand Functional mobility during ADLs: Minimal assistance, Moderate assistance, Rolling walker, Cueing for sequencing, Cueing for safety General ADL Comments: Bed mobility and sat EOB several minutes close min guard level-light steadying A. Onset of n/v sitting EOB.     Mobility  Overal bed mobility: Needs Assistance Bed Mobility: Supine to Sit Rolling: Mod assist Sidelying to sit: Mod assist, +2 for safety/equipment Supine to sit: Min guard Sit to supine: Max assist, +2 for physical assistance, +2 for safety/equipment, HOB elevated General bed mobility comments: Min guard for safety, no physical assist needed.    Transfers  Overall transfer level: Needs assistance Equipment used: Rolling walker (2 wheeled) Transfers: Sit to/from Stand Sit to Stand: Min assist, +2 physical assistance General transfer comment: min +2 for power up, correcting posterior bias.    Ambulation / Gait / Stairs / Wheelchair Mobility  Ambulation/Gait Ambulation/Gait assistance: Mod assist, +2  safety/equipment Gait Distance (Feet): 10 Feet Assistive device: Rolling walker (2 wheeled) Gait Pattern/deviations: Step-through pattern, Decreased stride length, Decreased weight shift to right, Decreased stance time - right, Trunk flexed General Gait Details: Mod assist to steady, navigate RW, correct posterior LOB x1. Very increased time, + R knee buckling observed. Gait velocity: decr    Posture / Balance Dynamic Sitting Balance Sitting balance - Comments: fair to poor, with intermittent min assist required to maintain upright sitting. Head held in L lateral flexion and R rotation, minimally improved with cuing. EOB sitting x10 minutes Balance Overall balance assessment: Needs assistance Sitting-balance support: Bilateral upper extremity supported, Feet supported Sitting balance-Leahy Scale: Fair Sitting balance - Comments: fair to poor, with intermittent min assist required to maintain upright sitting. Head held  in L lateral flexion and R rotation, minimally improved with cuing. EOB sitting x10 minutes Standing balance support: Bilateral upper extremity supported, During functional activity Standing balance-Leahy Scale: Poor Standing balance comment: reliant on external support    Special needs/care consideration Continuous Drip IV  lactated ringers infusion  Diabetic management : new DM diagnosis according to the patient.     Previous Home Environment (from acute therapy documentation) Living Arrangements: Children Available Help at Discharge: Family, Available 24 hours/day Type of Home: House Home Layout: One level Home Access: Stairs to enter Entrance Stairs-Rails: Can reach both Entrance Stairs-Number of Steps: 4 Bathroom Shower/Tub: Chiropodist: Handicapped height Home Care Services: No Additional Comments: has a friend installing rails throughout bathroom; has adjustable bed; lift chair  Discharge Living Setting Plans for Discharge Living Setting:  Patient's home, Lives with (comment) (lives with daughter Bing Quarry ) Type of Home at Discharge: House Discharge Home Layout: One level Discharge Home Access: Stairs to enter (open to ramp placement) Entrance Stairs-Rails: None Entrance Stairs-Number of Steps: 4 Discharge Bathroom Shower/Tub: Tub/shower unit Discharge Bathroom Toilet: Handicapped height Discharge Bathroom Accessibility: Yes How Accessible: Accessible via walker Does the patient have any problems obtaining your medications?: No  Social/Family/Support Systems Patient Roles: Other (Comment) (daughter lives with her and helps care for her) Contact Information: daughter: Koren Bound: [phone] Anticipated Caregiver: Koren Bound + other family members as needed Anticipated Caregiver's Contact Information: see above Ability/Limitations of Caregiver: Max A per Koren Bound Caregiver Availability: 24/7 Discharge Plan Discussed with Primary Caregiver: Yes Is Caregiver In Agreement with Plan?: Yes Does Caregiver/Family have Issues with Lodging/Transportation while Pt is in Rehab?: No   Goals Patient/Family Goal for Rehab: PT/OT: Min/Mod A; SLP: NA Expected length of stay: 10-14 days Cultural Considerations: NA Pt/Family Agrees to Admission and willing to participate: Yes Program Orientation Provided & Reviewed with Pt/Caregiver Including Roles  & Responsibilities: Yes (with pt and her daughter )  Barriers to Discharge: Home environment access/layout, New diabetic  Barriers to Discharge Comments: stairs to enter; will need education on new DM diagnosis   Decrease burden of Care through IP rehab admission: NA   Possible need for SNF placement upon discharge:Not anticipated. Pt has great social support from her daughter who lives with her and can provide for her 24/7.    Patient Condition: This patient's condition remains as documented in the consult dated 11/09/19, in which the Rehabilitation Physician determined and documented  that the patient's condition is appropriate for intensive rehabilitative care in an inpatient rehabilitation facility. Will admit to inpatient rehab today.  Preadmission Screen Completed By:  Raechel Ache, OT, 11/09/2019 4:54 PM ______________________________________________________________________   Discussed status with Dr. Posey Pronto on 11/09/19 at 4:54PM and received approval for admission today.  Admission Coordinator:  Raechel Ache, time 4:54PM/Date 11/09/19

## 2019-11-09 NOTE — H&P (Signed)
Physical Medicine and Rehabilitation Admission H&P    Chief Complaint  Patient presents with  . Hypertension  . Hyperglycemia  : HPI: Chelsea [name] is a 74 year old right-handed female with history of hypertension, diabetes mellitus, left carotid stenosis 80% was scheduled for carotid enterectomy in late November with Dr. Arbie Cookey, tobacco abuse,  medical noncompliance.  Per chart review independent with assistive device prior to admission.  1 level home 4 steps to entry.  She lives with her children and assistance as needed.  Presented 11/07/2019 with intractable nausea and vomiting as well as loss of vision left peripheral field.  Noted blood pressure 216/79.  Patient did have a CT of the head 10/30/2019 after a fall showing no evidence of acute changes.  MRI showed acute versus subacute infarct in the right hyper campus without hemorrhage.  Mild chronic ischemic changes.  CT angiogram of the head and neck ill-defined hypodensity right medial temporal lobe progressed since recent CT of 10/30/2019 most consistent with subacute infarction.  Occlusion left vertebral artery at the origin 80% focal noncalcified stenosis proximal left ICA artery.  Admission chemistries unremarkable except glucose 345 BUN 26 urine culture multiple species troponin negative hemoglobin A1c 10.1.  Echocardiogram with ejection fraction of 60 to 65% no wall motion abnormalities grade 1 diastolic dysfunction.  Maintained on aspirin and Plavix for CVA prophylaxis x3 months then Plavix alone.  Subcutaneous Lovenox for DVT prophylaxis.  Hospital course urinary retention urology consulted Foley catheter tube was placed 11/09/2019 with plans for a voiding trial as patient's mobility improved.  Tolerating a regular diet.  Therapy evaluations completed and patient was admitted for a comprehensive rehab program. Please see preadmission and consult from earlier today as well.    Review of Systems  Constitutional: Positive for  malaise/fatigue. Negative for chills and fever.  HENT: Negative for hearing loss.   Eyes: Positive for blurred vision.  Respiratory: Negative for cough and shortness of breath.   Cardiovascular: Negative for chest pain, palpitations and leg swelling.  Gastrointestinal: Positive for nausea and vomiting.  Genitourinary: Negative for dysuria, flank pain and hematuria.  Musculoskeletal: Positive for falls and myalgias.  Skin: Negative for rash.  Neurological: Positive for focal weakness and weakness.  All other systems reviewed and are negative.  Past Medical History:  Diagnosis Date  . Blind left eye   . Cardiac arrest (HCC)   . Diabetes mellitus without complication (HCC)   . Hypertension   . Stroke [facility])    History reviewed. No pertinent surgical history. Family History  Problem Relation Age of Onset  . Heart attack Mother   . Stroke Father    Social History:  reports that she has been smoking. She has never used smokeless tobacco. She reports previous alcohol use. She reports that she does not use drugs. Allergies:  Allergies  Allergen Reactions  . Codeine Anaphylaxis and Swelling    Throat swells    . Penicillins Anaphylaxis and Swelling    Throat closes    . Sulfa Antibiotics Anaphylaxis, Swelling and Other (See Comments)    Lips and eye swell   . Benzodiazepines Hives  . Lidocaine Hives  . Gabapentin Nausea And Vomiting   Medications Prior to Admission  Medication Sig Dispense Refill  . ascorbic acid (VITAMIN C) 500 MG tablet Take 500 mg by mouth daily.    Marland Kitchen aspirin EC 81 MG tablet Take 81 mg by mouth daily. Swallow whole.    . Calcium-Magnesium-Zinc (CAL-MAG-ZINC PO) Take 1 tablet  by mouth daily.    . Multiple Vitamin (MULTI-VITAMIN DAILY PO) Take by mouth.    . hydrochlorothiazide (HYDRODIURIL) 25 MG tablet Take 1 tablet (25 mg total) by mouth daily. 30 tablet 1  . metFORMIN (GLUCOPHAGE) 500 MG tablet Take 1 tablet (500 mg total) by mouth 2 (two) times daily  with a meal. (Patient not taking: Reported on 11/05/2019) 60 tablet 1    Drug Regimen Review Drug regimen was reviewed and remains appropriate with no significant issues identified  Home: Home Living Family/patient expects to be discharged to:: Private residence Living Arrangements: Children Available Help at Discharge: Family, Available 24 hours/day Type of Home: House Home Access: Stairs to enter Entergy CorporationEntrance Stairs-Number of Steps: 4 Entrance Stairs-Rails: Can reach both Home Layout: One level Bathroom Shower/Tub: Engineer, manufacturing systemsTub/shower unit Bathroom Toilet: Handicapped height Home Equipment: Information systems managerhower seat, Environmental consultantWalker - 2 wheels, Other (comment) Additional Comments: has a friend installing rails throughout bathroom; has adjustable bed; lift chair   Functional History: Prior Function Level of Independence: Independent with assistive device(s) Comments: was independent without AD until last week- then began using RW  Functional Status:  Mobility: Bed Mobility Overal bed mobility: Needs Assistance Bed Mobility: Rolling, Sidelying to Sit, Sit to Supine Rolling: Mod assist Sidelying to sit: Mod assist, +2 for safety/equipment Supine to sit: Mod assist Sit to supine: Max assist, +2 for physical assistance, +2 for safety/equipment, HOB elevated General bed mobility comments: rolling bilaterally requiring mod assist for trunk and LE translation, increased time to perform secondary to pt dizziness. Mod-max+2 for supine<>sit for trunk and LE management, scooting to and from EOB, and boost up in bed with use of bed pads. Step-by-step sequencing required. Transfers Overall transfer level: Needs assistance Equipment used: 1 person hand held assist Transfers: Sit to/from Stand Sit to Stand: Min assist General transfer comment: unable to attempt 2/2 n/v and fatigue/lethargy      ADL: ADL Overall ADL's : Needs assistance/impaired Eating/Feeding: Set up, Sitting Grooming: Set up, Sitting Upper Body  Bathing: Minimal assistance, Sitting Lower Body Bathing: Minimal assistance, Sit to/from stand, Sitting/lateral leans Upper Body Dressing : Set up, Sitting Lower Body Dressing: Minimal assistance, Sit to/from stand, Sitting/lateral leans Toilet Transfer: Minimal assistance, Stand-pivot, BSC, RW Toileting- Clothing Manipulation and Hygiene: Minimal assistance, Sit to/from stand Functional mobility during ADLs: Minimal assistance, Moderate assistance, Rolling walker, Cueing for sequencing, Cueing for safety General ADL Comments: Bed mobility and sat EOB several minutes close min guard level-light steadying A. Onset of n/v sitting EOB.   Cognition: Cognition Overall Cognitive Status: Impaired/Different from baseline Orientation Level: Oriented X4 Cognition Arousal/Alertness: Lethargic Behavior During Therapy: Anxious, WFL for tasks assessed/performed Overall Cognitive Status: Impaired/Different from baseline Area of Impairment: Safety/judgement Safety/Judgement: Decreased awareness of safety, Decreased awareness of deficits General Comments: more alert sitting EOB, initially drowsy and stating "I feel too weak (to get up)". Pt with L inattention sitting EOB, able to track towards L when cued.  Physical Exam: Blood pressure (!) 135/53, pulse 73, temperature 98.4 F (36.9 C), temperature source Oral, resp. rate 18, SpO2 94 %. Constitutional:      General: She is not in acute distress.    Appearance: She is normal weight.  HENT:     Head: Normocephalic and atraumatic.     Right Ear: External ear normal.     Left Ear: External ear normal.     Nose: Nose normal.  Eyes:     General:        Right eye: No discharge.  Left eye: No discharge.     Extraocular Movements: Extraocular movements intact.  Cardiovascular:     Rate and Rhythm: Normal rate and regular rhythm.  Pulmonary:     Effort: Pulmonary effort is normal. No respiratory distress.     Breath sounds: No stridor.    Abdominal:     General: Abdomen is flat. Bowel sounds are normal. There is no distension.  Musculoskeletal:     Cervical back: Normal range of motion and neck supple.     Comments: No edema or tenderness in extremities  Skin:    General: Skin is warm and dry.  Neurological:     Mental Status: She is alert.     Comments: Alert Provides name and age.   Follows simple commands. Sensation diminished light touch bilateral feet Hyperalgesia left shoulder Motor: Bilateral upper extremities: 5/5 proximal distal Bilateral lower extremities: Hip flexion 4 -/5, knee extension 4/5, ankle dorsiflexion 5/5 (right weaker than left) Left neglect  Psychiatric:        Mood and Affect: Affect is blunt.        Speech: Speech is delayed.   Results for orders placed or performed during the hospital encounter of 11/07/19 (from the past 48 hour(s))  CBG monitoring, ED     Status: None   Collection Time: 11/07/19 12:36 PM  Result Value Ref Range   Glucose-Capillary 88 70 - 99 mg/dL    Comment: Glucose reference range applies only to samples taken after fasting for at least 8 hours.  CBG monitoring, ED     Status: Abnormal   Collection Time: 11/07/19  3:28 PM  Result Value Ref Range   Glucose-Capillary 163 (H) 70 - 99 mg/dL    Comment: Glucose reference range applies only to samples taken after fasting for at least 8 hours.  Glucose, capillary     Status: Abnormal   Collection Time: 11/07/19  4:56 PM  Result Value Ref Range   Glucose-Capillary 215 (H) 70 - 99 mg/dL    Comment: Glucose reference range applies only to samples taken after fasting for at least 8 hours.  Troponin I (High Sensitivity)     Status: None   Collection Time: 11/07/19  5:23 PM  Result Value Ref Range   Troponin I (High Sensitivity) 8 <18 ng/L    Comment: (NOTE) Elevated high sensitivity troponin I (hsTnI) values and significant  changes across serial measurements may suggest ACS but many other  chronic and acute conditions  are known to elevate hsTnI results.  Refer to the "Links" section for chest pain algorithms and additional  guidance. Performed at [facility], [address] [place], [place] [address]   Glucose, capillary     Status: Abnormal   Collection Time: 11/07/19  9:25 PM  Result Value Ref Range   Glucose-Capillary 137 (H) 70 - 99 mg/dL    Comment: Glucose reference range applies only to samples taken after fasting for at least 8 hours.   Comment 1 Notify RN    Comment 2 Document in Chart   Comprehensive metabolic panel     Status: Abnormal   Collection Time: 11/08/19  4:06 AM  Result Value Ref Range   Sodium 137 135 - 145 mmol/L   Potassium 3.5 3.5 - 5.1 mmol/L   Chloride 103 98 - 111 mmol/L   CO2 25 22 - 32 mmol/L   Glucose, Bld 165 (H) 70 - 99 mg/dL    Comment: Glucose reference range applies only to  samples taken after fasting for at least 8 hours.   BUN 14 8 - 23 mg/dL   Creatinine, Ser 4.16 (H) 0.44 - 1.00 mg/dL   Calcium 9.2 8.9 - 60.6 mg/dL   Total Protein 5.8 (L) 6.5 - 8.1 g/dL   Albumin 3.3 (L) 3.5 - 5.0 g/dL   AST 17 15 - 41 U/L   ALT 14 0 - 44 U/L   Alkaline Phosphatase 73 38 - 126 U/L   Total Bilirubin 0.8 0.3 - 1.2 mg/dL   GFR, Estimated 56 (L) >60 mL/min    Comment: (NOTE) Calculated using the CKD-EPI Creatinine Equation (2021)    Anion gap 9 5 - 15    Comment: Performed at [facility], [address], [place], [place] [address]  Protime-INR     Status: None   Collection Time: 11/08/19  4:06 AM  Result Value Ref Range   Prothrombin Time 13.1 11.4 - 15.2 seconds   INR 1.0 0.8 - 1.2    Comment: (NOTE) INR goal varies based on device and disease states. Performed at [facility], [address] [place], [place] [address]   APTT     Status: None   Collection Time: 11/08/19  4:06 AM  Result Value Ref Range   aPTT 32 24 - 36 seconds    Comment: Performed at [facility], [address], [place], [place] [address]  CBC WITH DIFFERENTIAL      Status: Abnormal   Collection Time: 11/08/19  4:06 AM  Result Value Ref Range   WBC 12.7 (H) 4.0 - 10.5 K/uL   RBC 4.17 3.87 - 5.11 MIL/uL   Hemoglobin 13.0 12.0 - 15.0 g/dL   HCT 22.0 36 - 46 %   MCV 92.6 80.0 - 100.0 fL   MCH 31.2 26.0 - 34.0 pg   MCHC 33.7 30.0 - 36.0 g/dL   RDW 25.4 27.0 - 62.3 %   Platelets 311 150 - 400 K/uL   nRBC 0.0 0.0 - 0.2 %   Neutrophils Relative % 70 %   Neutro Abs 8.9 (H) 1.7 - 7.7 K/uL   Lymphocytes Relative 23 %   Lymphs Abs 3.0 0.7 - 4.0 K/uL   Monocytes Relative 6 %   Monocytes Absolute 0.8 0.1 - 1.0 K/uL   Eosinophils Relative 0 %   Eosinophils Absolute 0.1 0.0 - 0.5 K/uL   Basophils Relative 1 %   Basophils Absolute 0.1 0.0 - 0.1 K/uL   Immature Granulocytes 0 %   Abs Immature Granulocytes 0.03 0.00 - 0.07 K/uL    Comment: Performed at [facility], [address], [place], [place] [address]  Lipid panel     Status: Abnormal   Collection Time: 11/08/19  4:06 AM  Result Value Ref Range   Cholesterol 173 0 - 200 mg/dL   Triglycerides 82 <151 mg/dL   HDL 36 (L) >76 mg/dL   Total CHOL/HDL Ratio 4.8 RATIO   VLDL 16 0 - 40 mg/dL   LDL Cholesterol 160 (H) 0 - 99 mg/dL    Comment:        Total Cholesterol/HDL:CHD Risk Coronary Heart Disease Risk Table                     Men   Women  1/2 Average Risk   3.4   3.3  Average Risk       5.0   4.4  2 X Average Risk  9.6   7.1  3 X Average Risk  23.4   11.0        Use the calculated Patient Ratio above and the CHD Risk Table to determine the patient's CHD Risk.        ATP III CLASSIFICATION (LDL):  <100     mg/dL   Optimal  350-093  mg/dL   Near or Above                    Optimal  130-159  mg/dL   Borderline  818-299  mg/dL   High  >371     mg/dL   Very High Performed at [facility], [address] [place], [place] [address]   Glucose, capillary     Status: Abnormal   Collection Time: 11/08/19  6:43 AM  Result Value Ref Range   Glucose-Capillary 124 (H) 70 - 99  mg/dL    Comment: Glucose reference range applies only to samples taken after fasting for at least 8 hours.   Comment 1 Notify RN    Comment 2 Document in Chart   Glucose, capillary     Status: Abnormal   Collection Time: 11/08/19 12:39 PM  Result Value Ref Range   Glucose-Capillary 158 (H) 70 - 99 mg/dL    Comment: Glucose reference range applies only to samples taken after fasting for at least 8 hours.  Glucose, capillary     Status: Abnormal   Collection Time: 11/08/19  4:33 PM  Result Value Ref Range   Glucose-Capillary 157 (H) 70 - 99 mg/dL    Comment: Glucose reference range applies only to samples taken after fasting for at least 8 hours.  Glucose, capillary     Status: Abnormal   Collection Time: 11/08/19  9:12 PM  Result Value Ref Range   Glucose-Capillary 132 (H) 70 - 99 mg/dL    Comment: Glucose reference range applies only to samples taken after fasting for at least 8 hours.  Glucose, capillary     Status: Abnormal   Collection Time: 11/09/19  7:13 AM  Result Value Ref Range   Glucose-Capillary 169 (H) 70 - 99 mg/dL    Comment: Glucose reference range applies only to samples taken after fasting for at least 8 hours.   ECHOCARDIOGRAM COMPLETE BUBBLE STUDY  Result Date: 11/07/2019    ECHOCARDIOGRAM REPORT   Patient Name:   Chelsea [name] Date of Exam: 11/07/2019 Medical Rec #:  [id number]      Height:       67.0 in Accession #:    [id number]     Weight:       141.1 lb Date of Birth:  1945-03-15      BSA:          1.744 m Patient Age:    74 years       BP:           199/78 mmHg Patient Gender: F              HR:           59 bpm. Exam Location:  Inpatient Procedure: 2D Echo, Cardiac Doppler, Color Doppler and Saline Contrast Bubble            Study Indications:    Stroke 434.91 / I163.9  History:        Patient has no prior history of Echocardiogram examinations.  Risk Factors:Hypertension and Diabetes. Cardiac Arrest.  Sonographer:    Tiffany Dance Referring Phys:  [id number] Deno Lunger [place] IMPRESSIONS  1. Left ventricular ejection fraction, by estimation, is 60 to 65%. The left ventricle has normal function. The left ventricle has no regional wall motion abnormalities. There is mild asymmetric left ventricular hypertrophy of the basal-septal segment. Left ventricular diastolic parameters are consistent with Grade I diastolic dysfunction (impaired relaxation). Elevated left ventricular end-diastolic pressure.  2. Right ventricular systolic function is normal. The right ventricular size is normal.  3. Left atrial size was moderately dilated.  4. The mitral valve is normal in structure. Trivial mitral valve regurgitation. No evidence of mitral stenosis. Moderate mitral annular calcification.  5. The aortic valve is tricuspid. There is mild calcification of the aortic valve. There is mild thickening of the aortic valve. Aortic valve regurgitation is not visualized. Mild aortic valve sclerosis is present, with no evidence of aortic valve stenosis.  6. The inferior vena cava is normal in size with greater than 50% respiratory variability, suggesting right atrial pressure of 3 mmHg.  7. Agitated saline contrast bubble study was negative, with no evidence of any interatrial shunt. Comparison(s): No prior Echocardiogram. Conclusion(s)/Recommendation(s): Normal biventricular function without evidence of hemodynamically significant valvular heart disease. No intracardiac source of embolism detected on this transthoracic study. A transesophageal echocardiogram is recommended to exclude cardiac source of embolism if clinically indicated. FINDINGS  Left Ventricle: Left ventricular ejection fraction, by estimation, is 60 to 65%. The left ventricle has normal function. The left ventricle has no regional wall motion abnormalities. The left ventricular internal cavity size was normal in size. There is  mild asymmetric left ventricular hypertrophy of the basal-septal segment. Left ventricular  diastolic parameters are consistent with Grade I diastolic dysfunction (impaired relaxation). Elevated left ventricular end-diastolic pressure. Right Ventricle: The right ventricular size is normal. No increase in right ventricular wall thickness. Right ventricular systolic function is normal. Left Atrium: Left atrial size was moderately dilated. Right Atrium: Right atrial size was normal in size. Pericardium: There is no evidence of pericardial effusion. Mitral Valve: The mitral valve is normal in structure. Moderate mitral annular calcification. Trivial mitral valve regurgitation. No evidence of mitral valve stenosis. Tricuspid Valve: The tricuspid valve is normal in structure. Tricuspid valve regurgitation is trivial. No evidence of tricuspid stenosis. Aortic Valve: The aortic valve is tricuspid. There is mild calcification of the aortic valve. There is mild thickening of the aortic valve. Aortic valve regurgitation is not visualized. Mild aortic valve sclerosis is present, with no evidence of aortic valve stenosis. Pulmonic Valve: The pulmonic valve was not well visualized. Pulmonic valve regurgitation is mild. No evidence of pulmonic stenosis. Aorta: The aortic root, ascending aorta and aortic arch are all structurally normal, with no evidence of dilitation or obstruction. Venous: The inferior vena cava is normal in size with greater than 50% respiratory variability, suggesting right atrial pressure of 3 mmHg. IAS/Shunts: No atrial level shunt detected by color flow Doppler. Agitated saline contrast was given intravenously to evaluate for intracardiac shunting. Agitated saline contrast bubble study was negative, with no evidence of any interatrial shunt.  LEFT VENTRICLE PLAX 2D LVIDd:         4.49 cm  Diastology LVIDs:         3.36 cm  LV e' medial:    3.92 cm/s LV PW:         0.97 cm  LV E/e' medial:  17.0 LV IVS:  1.41 cm  LV e' lateral:   4.90 cm/s LVOT diam:     2.30 cm  LV E/e' lateral: 13.6 LV SV:          107 LV SV Index:   61 LVOT Area:     4.15 cm  RIGHT VENTRICLE             IVC RV Basal diam:  2.89 cm     IVC diam: 1.60 cm RV S prime:     12.50 cm/s TAPSE (M-mode): 2.6 cm LEFT ATRIUM             Index       RIGHT ATRIUM           Index LA diam:        3.90 cm 2.24 cm/m  RA Area:     14.80 cm LA Vol (A2C):   81.5 ml 46.74 ml/m RA Volume:   38.20 ml  21.91 ml/m LA Vol (A4C):   60.2 ml 34.53 ml/m LA Biplane Vol: 70.3 ml 40.32 ml/m  AORTIC VALVE LVOT Vmax:   94.45 cm/s LVOT Vmean:  64.600 cm/s LVOT VTI:    0.258 m  AORTA Ao Root diam: 3.70 cm Ao Asc diam:  3.40 cm MITRAL VALVE MV Area (PHT): 1.87 cm     SHUNTS MV Decel Time: 405 msec     Systemic VTI:  0.26 m MV E velocity: 66.50 cm/s   Systemic Diam: 2.30 cm MV A velocity: 114.00 cm/s MV E/A ratio:  0.58 Jodelle Red MD Electronically signed by Jodelle Red MD Signature Date/Time: 11/07/2019/3:25:37 PM    Final        Medical Problem List and Plan: 1.  Decreased functional mobility with falls loss of vision left peripheral field secondary to right PCA infarction in the setting of left vertebral artery occlusion along with severe left ICA stenosis with previously silent subacute left parietal infarct secondary to large vessel disease.  -patient may shower  -ELOS/Goals: 12-16 days/Min/Mod A  Admit to CIR 2.  Antithrombotics: -DVT/anticoagulation: Lovenox  -antiplatelet therapy: Aspirin 81 mg daily and Plavix 25 mg daily x3 months then Plavix alone 3. Pain Management: Lyrica 25 mg daily 4. Mood: Provide emotional support  -antipsychotic agents: N/A 5. Neuropsych: This patient is capable of making decisions on her own behalf. 6. Skin/Wound Care: Routine skin checks 7. Fluids/Electrolytes/Nutrition: Routine in and outs. CMP ordered. 8.  Hypertension.  Coreg 12.5 mg twice daily.  Monitor with increased mobility.  9.  Diabetes mellitus peripheral neuropathy.  Hemoglobin A1c 10.1.  SSI.  Patient on Glucophage 500 mg twice  daily prior to admission.  Resume as needed  Monitor with increased mobulity 10.  Hyperlipidemia.  Lipitor 12.  Tobacco abuse.  Counseling 13.  Medical noncompliance.  Cousnel  Mcarthur Rossetti Angiulli, PA-C 11/09/2019  I have personally performed a face to face diagnostic evaluation, including, but not limited to relevant history and physical exam findings, of this patient and developed relevant assessment and plan.  Additionally, I have reviewed and concur with the physician assistant's documentation above.  Maryla Morrow, MD, ABPMR

## 2019-11-09 NOTE — Progress Notes (Addendum)
PROGRESS NOTE        PATIENT DETAILS Name: Chelsea [name] Age: 74 y.o. Sex: female Date of Birth: 05-09-1945 Admit Date: 11/07/2019 Admitting Physician Dewayne Shorter Levora Dredge, MD AVW:[id number], Jovita Gamma, DO  Brief Narrative: Patient is a 74 y.o. female who has not seen a MD for almost 15 years-recently diagnosed with HTN, DM-2, left carotid artery stenosis (apparently scheduled for carotid endarterectomy late November)-presenting with 1 week history of frequent falls, subtle right lower extremity weakness, nausea and vomiting.   Significant events: 10/29>> acute urinary retention-difficult Foley insertion-subsequently inserted by urology  Significant studies: 10/27>> MRI brain: Acute/subacute infarct in the right hippocampus without hemorrhage. 10/27>> CTA head/neck: Left vertebral artery occlusion, 80% stenosis at the left internal carotid artery, diffuse intracranial atherosclerotic disease. 10/27>> x-ray abdomen: Nonobstructive bowel gas pattern 10/27>> A1c: 10.1 10/28>> LDL 121 10/28>> Echo: EF 60-65%, grade 1 diastolic dysfunction.  Negative bubble study.  Antimicrobial therapy: None  Microbiology data: None  Procedures : None  Consults: Neurology, urology  DVT Prophylaxis : enoxaparin (LOVENOX) injection 40 mg Start: 11/07/19 1000  Subjective: Feels better-denies headache-vertigo.  No vomiting since yesterday-some mild nausea but claims that she feels much better today compared to the past few days.  Assessment/Plan: Acute CVA: No new focal deficits-has stabilized over the past few days-evaluated by neurology with recommendations to continue aspirin/Plavix for 3 months-followed by Plavix alone.  Remains on high intensity statin.  Evaluated by rehab services-recommendations are for CIR.    Uncontrolled HTN: Initially some amount of permissive hypertension was allowed-BP still fluctuating on the higher side-we will increase Coreg to 12.5 mg twice  daily.  Continue to follow and optimize.    Nausea/Vomiting: Probably related to CVA and uncontrolled hypertension-could have gastroparesis due to longstanding diabetes (A1c 10.1)-abdominal exam remains benign-the vomiting seems to have improved-continue supportive care and as needed antiepileptics.   Known left carotid artery stenosis: Has been evaluated by vascular surgery in the outpatient setting with tentative plans for carotid endarterectomy later this month.  Currently on antiplatelet/statin-plan is to resume follow-up with vascular surgery as previous post discharge.  DM-2: CBG stable-continue with SSI.  Recent Labs    11/08/19 1633 11/08/19 2112 11/09/19 0713  GLUCAP 157* 132* 169*   History of postherpetic neuralgia: Apparently takes Neurontin (not listed on the home medication list)-however this is listed as an allergy (as she started having nausea/vomiting-but was probably from CVA/uncontrolled hypertension rather than Neurontin).  After discussion with pharmacy-and with patient-we have started Lyrica which she seems to have tolerated well.  Continue to follow and adjust medication dosage accordingly.  Anxiety: Significant anxiety component-patient scared of side effect of medications (has had numerous anaphylaxis in the past per history)-daughter at bedside is also very anxious.  Have provided reassurance-follow-may need as needed benzos if this much of anxiety continues.  However anxiety seems to be better controlled today given improvement in symptoms.  ?  Myoclonic jerks: Apparently this occurred a few days back-none overnight-could be related to CVA-if it involves the thalamic area.  Supportive care continues-electrolytes within normal limits.  Acute urinary retention: Required Foley catheter insertion on 10/28-evaluated by urology-with recommendations to continue Foley catheter and proceed with a voiding trial next week.  Per patient-she has had incontinence for the past  several months-reports that she just cannot hold it-and starts dribbling.  Tobacco abuse: Counseled  Diet: Diet Order            Diet regular Room service appropriate? Yes; Fluid consistency: Thin  Diet effective now                  Code Status: Full code   Family Communication: Daughter at bedside  Disposition Plan: Status is: Inpatient  The patient will require care spanning > 2 midnights and should be moved to inpatient because: Inpatient level of care appropriate due to severity of illness  Dispo: The patient is from: Home              Anticipated d/c is to: CIR vs SNF              Anticipated d/c date is: 2 days              Patient currently is not medically stable to d/c.  Barriers to Discharge: Resolving nausea/vomiting-improving uncontrolled hypertension-awaiting CIR evaluation.  Antimicrobial agents: Anti-infectives (From admission, onward)   None       Time spent: 25-minutes-Greater than 50% of this time was spent in counseling, explanation of diagnosis, planning of further management, and coordination of care.  MEDICATIONS: Scheduled Meds: . aspirin EC  81 mg Oral Daily  . atorvastatin  80 mg Oral Daily  . carvedilol  12.5 mg Oral BID WC  . Chlorhexidine Gluconate Cloth  6 each Topical Daily  . clopidogrel  75 mg Oral Daily  . enoxaparin (LOVENOX) injection  40 mg Subcutaneous Daily  . insulin aspart  0-15 Units Subcutaneous TID AC & HS  . pregabalin  25 mg Oral QPM   Continuous Infusions: . lactated ringers Stopped (11/08/19 1230)   PRN Meds:.acetaminophen **OR** acetaminophen, diphenhydrAMINE, hydrALAZINE, ondansetron **OR** ondansetron (ZOFRAN) IV, polyethylene glycol, promethazine   PHYSICAL EXAM: Vital signs: Vitals:   11/08/19 2100 11/09/19 0013 11/09/19 0430 11/09/19 0734  BP: (!) 157/57 (!) 187/63 (!) 179/60 (!) 135/53  Pulse: 64 65 69 73  Resp: 18 20 20 18   Temp: 98.5 F (36.9 C) 98.5 F (36.9 C) 98.3 F (36.8 C) 98.4 F  (36.9 C)  TempSrc: Oral Oral Oral Oral  SpO2: 96%   94%   There were no vitals filed for this visit. There is no height or weight on file to calculate BMI.   Gen Exam:Alert awake-not in any distress HEENT:atraumatic, normocephalic Chest: B/L clear to auscultation anteriorly CVS:S1S2 regular Abdomen:soft non tender, non distended Extremities:no edema Neurology: Non focal Skin: no rash  I have personally reviewed following labs and imaging studies  LABORATORY DATA: CBC: Recent Labs  Lab 11/06/19 0204 11/07/19 0339 11/08/19 0406  WBC 8.9 11.1* 12.7*  NEUTROABS 6.6 8.6* 8.9*  HGB 12.4 12.2 13.0  HCT 36.7 36.3 38.6  MCV 92.7 93.1 92.6  PLT 295 299 311    Basic Metabolic Panel: Recent Labs  Lab 11/06/19 0204 11/07/19 0339 11/08/19 0406  NA 135 132* 137  K 4.1 3.9 3.5  CL 98 98 103  CO2 27 27 25   GLUCOSE 345* 260* 165*  BUN 26* 15 14  CREATININE 0.86 0.82 1.04*  CALCIUM 8.7* 8.9 9.2    GFR: Estimated Creatinine Clearance: 45.5 mL/min (A) (by C-G formula based on SCr of 1.04 mg/dL (H)).  Liver Function Tests: Recent Labs  Lab 11/06/19 0204 11/07/19 0339 11/08/19 0406  AST 14* 17 17  ALT 13 14 14   ALKPHOS 81 78 73  BILITOT 0.7 0.5 0.8  PROT 6.0* 5.8* 5.8*  ALBUMIN  3.4* 3.3* 3.3*   Recent Labs  Lab 11/06/19 0204  LIPASE 30   No results for input(s): AMMONIA in the last 168 hours.  Coagulation Profile: Recent Labs  Lab 11/08/19 0406  INR 1.0    Cardiac Enzymes: No results for input(s): CKTOTAL, CKMB, CKMBINDEX, TROPONINI in the last 168 hours.  BNP (last 3 results) No results for input(s): PROBNP in the last 8760 hours.  Lipid Profile: Recent Labs    11/08/19 0406  CHOL 173  HDL 36*  LDLCALC 121*  TRIG 82  CHOLHDL 4.8    Thyroid Function Tests: No results for input(s): TSH, T4TOTAL, FREET4, T3FREE, THYROIDAB in the last 72 hours.  Anemia Panel: No results for input(s): VITAMINB12, FOLATE, FERRITIN, TIBC, IRON, RETICCTPCT in the  last 72 hours.  Urine analysis:    Component Value Date/Time   COLORURINE STRAW (A) 11/07/2019 1019   APPEARANCEUR CLEAR 11/07/2019 1019   LABSPEC 1.010 11/07/2019 1019   PHURINE 7.0 11/07/2019 1019   GLUCOSEU >=500 (A) 11/07/2019 1019   HGBUR NEGATIVE 11/07/2019 1019   BILIRUBINUR NEGATIVE 11/07/2019 1019   KETONESUR NEGATIVE 11/07/2019 1019   PROTEINUR 100 (A) 11/07/2019 1019   UROBILINOGEN 0.2 09/17/2008 1929   NITRITE NEGATIVE 11/07/2019 1019   LEUKOCYTESUR NEGATIVE 11/07/2019 1019    Sepsis Labs: Lactic Acid, Venous No results found for: LATICACIDVEN  MICROBIOLOGY: Recent Results (from the past 240 hour(s))  Urine culture     Status: Abnormal   Collection Time: 11/06/19  4:55 AM   Specimen: Urine, Clean Catch  Result Value Ref Range Status   Specimen Description   Final    URINE, CLEAN CATCH Performed at [facility], [address], [place], [place] [address]    Special Requests   Final    NONE Performed at [facility], [address] [place], [place] [address]    Culture MULTIPLE SPECIES PRESENT, SUGGEST RECOLLECTION (A)  Final   Report Status 11/08/2019 FINAL  Final  Respiratory Panel by RT PCR (Flu A&B, Covid) - Nasopharyngeal Swab     Status: None   Collection Time: 11/07/19  3:43 AM   Specimen: Nasopharyngeal Swab  Result Value Ref Range Status   SARS Coronavirus 2 by RT PCR NEGATIVE NEGATIVE Final    Comment: (NOTE) SARS-CoV-2 target nucleic acids are NOT DETECTED.  The SARS-CoV-2 RNA is generally detectable in upper respiratoy specimens during the acute phase of infection. The lowest concentration of SARS-CoV-2 viral copies this assay can detect is 131 copies/mL. A negative result does not preclude SARS-Cov-2 infection and should not be used as the sole basis for treatment or other patient management decisions. A negative result may occur with  improper specimen collection/handling, submission of specimen other than nasopharyngeal swab, presence of  viral mutation(s) within the areas targeted by this assay, and inadequate number of viral copies (<131 copies/mL). A negative result must be combined with clinical observations, patient history, and epidemiological information. The expected result is Negative.  Fact Sheet for Patients:  https://www.moore.com/  Fact Sheet for Healthcare Providers:  https://www.young.biz/  This test is no t yet approved or cleared by the [place] FDA and  has been authorized for detection and/or diagnosis of SARS-CoV-2 by FDA under an Emergency Use Authorization (EUA). This EUA will remain  in effect (meaning this test can be used) for the duration of the COVID-19 declaration under Section 564(b)(1) of the Act, 21 U.S.C. section 360bbb-3(b)(1), unless the authorization is terminated or revoked sooner.     Influenza  A by PCR NEGATIVE NEGATIVE Final   Influenza B by PCR NEGATIVE NEGATIVE Final    Comment: (NOTE) The Xpert Xpress SARS-CoV-2/FLU/RSV assay is intended as an aid in  the diagnosis of influenza from Nasopharyngeal swab specimens and  should not be used as a sole basis for treatment. Nasal washings and  aspirates are unacceptable for Xpert Xpress SARS-CoV-2/FLU/RSV  testing.  Fact Sheet for Patients: https://www.moore.com/  Fact Sheet for Healthcare Providers: https://www.young.biz/  This test is not yet approved or cleared by the [place] FDA and  has been authorized for detection and/or diagnosis of SARS-CoV-2 by  FDA under an Emergency Use Authorization (EUA). This EUA will remain  in effect (meaning this test can be used) for the duration of the  Covid-19 declaration under Section 564(b)(1) of the Act, 21  U.S.C. section 360bbb-3(b)(1), unless the authorization is  terminated or revoked. Performed at [facility], [address] [place], [place] [address]     RADIOLOGY  STUDIES/RESULTS: ECHOCARDIOGRAM COMPLETE BUBBLE STUDY  Result Date: 11/07/2019    ECHOCARDIOGRAM REPORT   Patient Name:   Chelsea [name] Date of Exam: 11/07/2019 Medical Rec #:  [id number]      Height:       67.0 in Accession #:    [id number]     Weight:       141.1 lb Date of Birth:  September 07, 1945      BSA:          1.744 m Patient Age:    74 years       BP:           199/78 mmHg Patient Gender: F              HR:           59 bpm. Exam Location:  Inpatient Procedure: 2D Echo, Cardiac Doppler, Color Doppler and Saline Contrast Bubble            Study Indications:    Stroke 434.91 / I163.9  History:        Patient has no prior history of Echocardiogram examinations.                 Risk Factors:Hypertension and Diabetes. Cardiac Arrest.  Sonographer:    Tiffany Dance Referring Phys: [id number] Deno Lunger [place]AL[place] IMPRESSIONS  1. Left ventricular ejection fraction, by estimation, is 60 to 65%. The left ventricle has normal function. The left ventricle has no regional wall motion abnormalities. There is mild asymmetric left ventricular hypertrophy of the basal-septal segment. Left ventricular diastolic parameters are consistent with Grade I diastolic dysfunction (impaired relaxation). Elevated left ventricular end-diastolic pressure.  2. Right ventricular systolic function is normal. The right ventricular size is normal.  3. Left atrial size was moderately dilated.  4. The mitral valve is normal in structure. Trivial mitral valve regurgitation. No evidence of mitral stenosis. Moderate mitral annular calcification.  5. The aortic valve is tricuspid. There is mild calcification of the aortic valve. There is mild thickening of the aortic valve. Aortic valve regurgitation is not visualized. Mild aortic valve sclerosis is present, with no evidence of aortic valve stenosis.  6. The inferior vena cava is normal in size with greater than 50% respiratory variability, suggesting right atrial pressure of 3 mmHg.  7. Agitated saline  contrast bubble study was negative, with no evidence of any interatrial shunt. Comparison(s): No prior Echocardiogram. Conclusion(s)/Recommendation(s): Normal biventricular function without evidence of hemodynamically significant valvular heart disease. No  intracardiac source of embolism detected on this transthoracic study. A transesophageal echocardiogram is recommended to exclude cardiac source of embolism if clinically indicated. FINDINGS  Left Ventricle: Left ventricular ejection fraction, by estimation, is 60 to 65%. The left ventricle has normal function. The left ventricle has no regional wall motion abnormalities. The left ventricular internal cavity size was normal in size. There is  mild asymmetric left ventricular hypertrophy of the basal-septal segment. Left ventricular diastolic parameters are consistent with Grade I diastolic dysfunction (impaired relaxation). Elevated left ventricular end-diastolic pressure. Right Ventricle: The right ventricular size is normal. No increase in right ventricular wall thickness. Right ventricular systolic function is normal. Left Atrium: Left atrial size was moderately dilated. Right Atrium: Right atrial size was normal in size. Pericardium: There is no evidence of pericardial effusion. Mitral Valve: The mitral valve is normal in structure. Moderate mitral annular calcification. Trivial mitral valve regurgitation. No evidence of mitral valve stenosis. Tricuspid Valve: The tricuspid valve is normal in structure. Tricuspid valve regurgitation is trivial. No evidence of tricuspid stenosis. Aortic Valve: The aortic valve is tricuspid. There is mild calcification of the aortic valve. There is mild thickening of the aortic valve. Aortic valve regurgitation is not visualized. Mild aortic valve sclerosis is present, with no evidence of aortic valve stenosis. Pulmonic Valve: The pulmonic valve was not well visualized. Pulmonic valve regurgitation is mild. No evidence of  pulmonic stenosis. Aorta: The aortic root, ascending aorta and aortic arch are all structurally normal, with no evidence of dilitation or obstruction. Venous: The inferior vena cava is normal in size with greater than 50% respiratory variability, suggesting right atrial pressure of 3 mmHg. IAS/Shunts: No atrial level shunt detected by color flow Doppler. Agitated saline contrast was given intravenously to evaluate for intracardiac shunting. Agitated saline contrast bubble study was negative, with no evidence of any interatrial shunt.  LEFT VENTRICLE PLAX 2D LVIDd:         4.49 cm  Diastology LVIDs:         3.36 cm  LV e' medial:    3.92 cm/s LV PW:         0.97 cm  LV E/e' medial:  17.0 LV IVS:        1.41 cm  LV e' lateral:   4.90 cm/s LVOT diam:     2.30 cm  LV E/e' lateral: 13.6 LV SV:         107 LV SV Index:   61 LVOT Area:     4.15 cm  RIGHT VENTRICLE             IVC RV Basal diam:  2.89 cm     IVC diam: 1.60 cm RV S prime:     12.50 cm/s TAPSE (M-mode): 2.6 cm LEFT ATRIUM             Index       RIGHT ATRIUM           Index LA diam:        3.90 cm 2.24 cm/m  RA Area:     14.80 cm LA Vol (A2C):   81.5 ml 46.74 ml/m RA Volume:   38.20 ml  21.91 ml/m LA Vol (A4C):   60.2 ml 34.53 ml/m LA Biplane Vol: 70.3 ml 40.32 ml/m  AORTIC VALVE LVOT Vmax:   94.45 cm/s LVOT Vmean:  64.600 cm/s LVOT VTI:    0.258 m  AORTA Ao Root diam: 3.70 cm Ao Asc diam:  3.40 cm MITRAL  VALVE MV Area (PHT): 1.87 cm     SHUNTS MV Decel Time: 405 msec     Systemic VTI:  0.26 m MV E velocity: 66.50 cm/s   Systemic Diam: 2.30 cm MV A velocity: 114.00 cm/s MV E/A ratio:  0.58 Jodelle Red MD Electronically signed by Jodelle Red MD Signature Date/Time: 11/07/2019/3:25:37 PM    Final      LOS: 2 days   Jeoffrey Massed, MD  [facility]    To contact the attending provider between 7A-7P or the covering provider during after hours 7P-7A, please log into the web site [email or web address] and access using universal  [place] password for that web site. If you do not have the password, please call the hospital operator.  11/09/2019, 10:05 AM

## 2019-11-10 ENCOUNTER — Inpatient Hospital Stay (HOSPITAL_COMMUNITY): Payer: PPO

## 2019-11-10 ENCOUNTER — Inpatient Hospital Stay (HOSPITAL_COMMUNITY): Payer: PPO | Admitting: Speech Pathology

## 2019-11-10 ENCOUNTER — Inpatient Hospital Stay (HOSPITAL_COMMUNITY): Payer: PPO | Admitting: Physical Therapy

## 2019-11-10 DIAGNOSIS — E669 Obesity, unspecified: Secondary | ICD-10-CM

## 2019-11-10 DIAGNOSIS — E1169 Type 2 diabetes mellitus with other specified complication: Secondary | ICD-10-CM

## 2019-11-10 DIAGNOSIS — I63531 Cerebral infarction due to unspecified occlusion or stenosis of right posterior cerebral artery: Secondary | ICD-10-CM

## 2019-11-10 DIAGNOSIS — I10 Essential (primary) hypertension: Secondary | ICD-10-CM

## 2019-11-10 LAB — CREATININE, SERUM
Creatinine, Ser: 1.1 mg/dL — ABNORMAL HIGH (ref 0.44–1.00)
GFR, Estimated: 52 mL/min — ABNORMAL LOW

## 2019-11-10 LAB — CBC
HCT: 37.4 % (ref 36.0–46.0)
Hemoglobin: 12.6 g/dL (ref 12.0–15.0)
MCH: 31.5 pg (ref 26.0–34.0)
MCHC: 33.7 g/dL (ref 30.0–36.0)
MCV: 93.5 fL (ref 80.0–100.0)
Platelets: 301 10*3/uL (ref 150–400)
RBC: 4 MIL/uL (ref 3.87–5.11)
RDW: 12.2 % (ref 11.5–15.5)
WBC: 11 10*3/uL — ABNORMAL HIGH (ref 4.0–10.5)
nRBC: 0 % (ref 0.0–0.2)

## 2019-11-10 LAB — GLUCOSE, CAPILLARY
Glucose-Capillary: 141 mg/dL — ABNORMAL HIGH (ref 70–99)
Glucose-Capillary: 191 mg/dL — ABNORMAL HIGH (ref 70–99)
Glucose-Capillary: 178 mg/dL — ABNORMAL HIGH (ref 70–99)
Glucose-Capillary: 217 mg/dL — ABNORMAL HIGH (ref 70–99)

## 2019-11-10 MED ORDER — LIVING WELL WITH DIABETES BOOK
Freq: Once | Status: AC
  Administered 2019-11-10: 1
  Filled 2019-11-10: qty 1

## 2019-11-10 MED ORDER — ADULT MULTIVITAMIN W/MINERALS CH
1.0000 | ORAL_TABLET | Freq: Every day | ORAL | Status: DC
  Administered 2019-11-10 – 2019-12-02 (×23): 1 via ORAL
  Filled 2019-11-10 (×23): qty 1

## 2019-11-10 MED ORDER — CHLORHEXIDINE GLUCONATE CLOTH 2 % EX PADS
6.0000 | MEDICATED_PAD | Freq: Two times a day (BID) | CUTANEOUS | Status: DC
  Administered 2019-11-11 – 2019-11-21 (×19): 6 via TOPICAL

## 2019-11-10 MED ORDER — SCOPOLAMINE 1 MG/3DAYS TD PT72
1.0000 | MEDICATED_PATCH | TRANSDERMAL | Status: DC
  Administered 2019-11-10 – 2019-12-01 (×8): 1.5 mg via TRANSDERMAL
  Filled 2019-11-10 (×8): qty 1

## 2019-11-10 MED ORDER — BOOST / RESOURCE BREEZE PO LIQD CUSTOM
1.0000 | Freq: Three times a day (TID) | ORAL | Status: DC
  Administered 2019-11-10 – 2019-11-24 (×32): 1 via ORAL

## 2019-11-10 MED ORDER — CARVEDILOL 12.5 MG PO TABS
12.5000 mg | ORAL_TABLET | Freq: Two times a day (BID) | ORAL | Status: DC
  Administered 2019-11-10 – 2019-11-18 (×17): 12.5 mg via ORAL
  Filled 2019-11-10 (×17): qty 1

## 2019-11-10 MED ORDER — PROCHLORPERAZINE MALEATE 5 MG PO TABS
5.0000 mg | ORAL_TABLET | Freq: Two times a day (BID) | ORAL | Status: DC
  Administered 2019-11-10 – 2019-11-13 (×6): 5 mg via ORAL
  Filled 2019-11-10 (×6): qty 1

## 2019-11-10 MED ORDER — PROSOURCE PLUS PO LIQD
30.0000 mL | Freq: Two times a day (BID) | ORAL | Status: DC
  Administered 2019-11-10 – 2019-11-13 (×5): 30 mL via ORAL
  Filled 2019-11-10 (×6): qty 30

## 2019-11-10 NOTE — Progress Notes (Signed)
Initial Nutrition Assessment  RD working remotely.  DOCUMENTATION CODES:   Not applicable  INTERVENTION:   -D/c Ensure Enlive po BID, each supplement provides 350 kcal and 20 grams of protein -Boost Breeze po TID, each supplement provides 250 kcal and 9 grams of protein -MVI with minerals daily -30 ml Prosource Plus BID, each supplement provides 100 kcals and 15 grams protein  NUTRITION DIAGNOSIS:   Inadequate oral intake related to nausea, vomiting as evidenced by meal completion < 50%.  GOAL:   Patient will meet greater than or equal to 90% of their needs  MONITOR:   PO intake, Supplement acceptance, Labs, Weight trends, Skin, I & O's  REASON FOR ASSESSMENT:   Malnutrition Screening Tool    ASSESSMENT:   Chelsea [name]. Cavallero is a 74 year old right-handed female with history of hypertension, diabetes mellitus, left carotid stenosis 80% was scheduled for carotid enterectomy in late November with Dr. Arbie Cookey, tobacco abuse,  medical noncompliance.  Per chart review independent with assistive device prior to admission.  Pt admitted with decreased functional mobility with falls loss of vision left peripheral field secondary to right PCA infarction in the setting of left vertebral artery occlusion along with severe left ICA stenosis with previously silent subacute left parietal infarct secondary to large vessel disease.   Reviewed I/O's: -400 ml x 24 hours  UOP: 400 ml x 24 hours   Attempted to speak with pt via call to hospital room phone, however, unable to reach.   Per MD notes, pt with ongoing nausea and vomiting this morning. Noted meal completion 50%.   Reviewed wt hx; pt has experienced a 8.8% wt loss over the past 10 days, which is significant for time frame.   Lab Results  Component Value Date   HGBA1C 10.1 (H) 11/07/2019   PTA DM medications are 0-15 units insulin aspart TID with meals.   Labs reviewed: CBGS: 141-226 (inpatient orders for glycemic control are 0-15  units insulin aspart TID with meals and at bedtime).   Diet Order:   Diet Order            Diet regular Room service appropriate? Yes; Fluid consistency: Thin  Diet effective now                 EDUCATION NEEDS:   No education needs have been identified at this time  Skin:  Skin Assessment: Reviewed RN Assessment  Last BM:  11/07/19  Height:   Ht Readings from Last 1 Encounters:  11/06/19 5\' 7"  (1.702 m)    Weight:   Wt Readings from Last 1 Encounters:  11/09/19 62 kg    Ideal Body Weight:  61.4 kg  BMI:  Body mass index is 21.41 kg/m.  Estimated Nutritional Needs:   Kcal:  1850-2050  Protein:  95-110 grams  Fluid:  > 1.8 L    11/11/19, RD, LDN, CDCES Registered Dietitian II Certified Diabetes Care and Education Specialist Please refer to [facility] for RD and/or RD on-call/weekend/after hours pager

## 2019-11-10 NOTE — Evaluation (Addendum)
Physical Therapy Assessment and Plan  Patient Details  Name: Chelsea [name] MRN: [id number] Date of Birth: June 11, 1945  PT Diagnosis: Abnormal posture, Abnormality of gait, Cognitive deficits, Coordination disorder, Difficulty walking, Dizziness and giddiness, Hemiplegia non-dominant, Impaired sensation, Muscle spasms and Muscle weakness Rehab Potential: Fair ELOS: 3 weeks   Today's Date: 11/10/2019 PT Individual Time: 5400-8676 PT Individual Time Calculation (min): 70 min    Hospital Problem: Active Problems:   Acute ischemic right posterior cerebral artery (PCA) stroke [facility])   Past Medical History:  Past Medical History:  Diagnosis Date  . Blind left eye   . Cardiac arrest ([place])   . Diabetes mellitus without complication ([place])   . Hypertension   . Stroke [facility])    Past Surgical History: History reviewed. No pertinent surgical history.  Assessment & Plan Clinical Impression: Patient is a 74 y.o. year old female history of hypertension, diabetes mellitus, left carotid stenosis 80% was scheduled for carotid enterectomy in late November with Dr. Donnetta Hutching, tobacco abuse,  medical noncompliance.  Per chart review independent with assistive device prior to admission.  1 level home 4 steps to entry.  She lives with her children and assistance as needed.  Presented 11/07/2019 with intractable nausea and vomiting as well as loss of vision left peripheral field.  Noted blood pressure 216/79.  Patient did have a CT of the head 10/30/2019 after a fall showing no evidence of acute changes.  MRI showed acute versus subacute infarct in the right hyper campus without hemorrhage.  Mild chronic ischemic changes.  CT angiogram of the head and neck ill-defined hypodensity right medial temporal lobe progressed since recent CT of 10/30/2019 most consistent with subacute infarction.  Occlusion left vertebral artery at the origin 80% focal noncalcified stenosis proximal left ICA artery.  Admission chemistries  unremarkable except glucose 345 BUN 26 urine culture multiple species troponin negative hemoglobin A1c 10.1.  Echocardiogram with ejection fraction of 60 to 65% no wall motion abnormalities grade 1 diastolic dysfunction.  Maintained on aspirin and Plavix for CVA prophylaxis x3 months then Plavix alone.  Subcutaneous Lovenox for DVT prophylaxis.  Hospital course urinary retention urology consulted Foley catheter tube was placed 11/09/2019 with plans for a voiding trial as patient's mobility improved.  Tolerating a regular diet.  Therapy evaluations completed and patient was admitted for a comprehensive rehab program. Please see preadmission and consult from earlier today as well.   Patient transferred to CIR on 11/09/2019 .   Patient currently requires max with mobility secondary to muscle weakness, decreased cardiorespiratoy endurance, impaired timing and sequencing, unbalanced muscle activation, motor apraxia, decreased coordination and decreased motor planning, decreased visual perceptual skills and field cut, decreased midline orientation, decreased attention to left, left side neglect and decreased motor planning, and decreased sitting balance, decreased standing balance, decreased postural control, hemiplegia and decreased balance strategies.  Prior to hospitalization, patient was min with mobility and lived with Family in a House home.  Home access is 4Stairs to enter.  Patient will benefit from skilled PT intervention to maximize safe functional mobility, minimize fall risk and decrease caregiver burden for planned discharge home with 24 hour assist.  Anticipate patient will benefit from follow up [facility] at discharge.  PT - End of Session Activity Tolerance: Tolerates 30+ min activity with multiple rests Endurance Deficit: Yes PT Assessment Rehab Potential (ACUTE/IP ONLY): Fair PT Barriers to Discharge: [place] home environment;Home environment access/layout;Incontinence;Medication  compliance;Behavior PT Barriers to Discharge Comments: pt has 4 steps to enter however family  plans to install a ramp PT Patient demonstrates impairments in the following area(s): Balance;Behavior;Endurance;Motor;Sensory;Safety;Perception;Pain;Skin Integrity;Other (comment) (visual) PT Transfers Functional Problem(s): Bed Mobility;Bed to Chair;Car PT Locomotion Functional Problem(s): Ambulation;Wheelchair Mobility;Stairs PT Plan PT Intensity: Minimum of 1-2 x/day ,45 to 90 minutes PT Frequency: 5 out of 7 days PT Duration Estimated Length of Stay: 3 weeks PT Treatment/Interventions: Ambulation/gait training;Cognitive remediation/compensation;Balance/vestibular training;Community reintegration;Discharge planning;Disease management/prevention;DME/adaptive equipment instruction;Functional electrical stimulation;Neuromuscular re-education;Patient/family education;Functional mobility training;Pain management;Psychosocial support;Skin care/wound management;Stair training;Splinting/orthotics;Therapeutic Activities;Therapeutic Exercise;UE/LE Coordination activities;UE/LE Strength taining/ROM;Wheelchair propulsion/positioning;Visual/perceptual remediation/compensation PT Transfers Anticipated Outcome(s): CGA PT Locomotion Anticipated Outcome(s): min A PT Recommendation Recommendations for Other Services: Neuropsych consult;Vestibular eval;Therapeutic Recreation consult Therapeutic Recreation Interventions: Stress management Follow Up Recommendations: Home health PT;24 hour supervision/assistance Patient destination: Home Equipment Recommended: To be determined   PT Evaluation Precautions/Restrictions Precautions Precautions: Fall Restrictions Weight Bearing Restrictions: No General   Vital Signs Pain Pain Assessment Pain Scale: 0-10 Pain Score: 0-No pain Home Living/Prior Functioning Home Living Available Help at Discharge: Family;Available 24 hours/day Type of Home: House Home Access:  Stairs to enter [employer] of Steps: 4 Entrance Stairs-Rails: Can reach both Home Layout: One level Bathroom Shower/Tub: Chiropodist: Handicapped height Additional Comments: has a friend installing rails throughout bathroom; has adjustable bed; lift chair  Lives With: Family Prior Function Level of Independence: Needs assistance with gait;Needs assistance with tranfers;Needs assistance with ADLs;Needs assistance with homemaking  Able to Take Stairs?: Yes Driving: No Comments: pt's daughter reports pt requires assistance for all ADLs and was walking at home while family worked during day however had many falls and usually stayed [place] during the days Vision/Perception  Vision - Assessment Ocular Range of Motion: Within Functional Limits Alignment/Gaze Preference: Gaze right Tracking/Visual Pursuits: Decreased smoothness of eye movement to LEFT inferior field;Decreased smoothness of eye movement to LEFT superior field;Impaired - to be further tested in functional context;Requires cues, head turns, or add eye shifts to track Diplopia Assessment: Disappears with one eye closed (per pt) Additional Comments: L visual field cut and possible L side neglext as well. Right downward gaze preference sitting EOB Perception Perception: Impaired Praxis Praxis: Impaired  Cognition Overall Cognitive Status: Impaired/Different from baseline Arousal/Alertness: Lethargic Orientation Level: Oriented X4 Awareness: Appears intact Problem Solving: Impaired Problem Solving Impairment: Verbal complex;Verbal basic Executive Function: Sequencing;Decision Making;Initiating;Reasoning Reasoning: Impaired Sequencing: Impaired Decision Making: Impaired Initiating: Impaired Safety/Judgment: Impaired Sensation Sensation Light Touch: Impaired by gross assessment Light Touch Impaired Details: Impaired RLE;Impaired LLE (pt unable 5 attempts in upper leg, lower leg, feet in  BLE) Proprioception: Impaired by gross assessment Stereognosis: Impaired by gross assessment Coordination Gross Motor Movements are Fluid and Coordinated: No Fine Motor Movements are Fluid and Coordinated: No Motor  Motor Motor: Hemiplegia (L LE)   Trunk/Postural Assessment  Cervical Assessment Cervical Assessment: Exceptions to [facility] Thoracic Assessment Thoracic Assessment: Exceptions to [facility] Lumbar Assessment Lumbar Assessment: Exceptions to [facility] Postural Control Postural Control: Deficits on evaluation (forward head carriage, kyphotic, posterior pelvic tilt)  Balance Balance Balance Assessed: Yes Dynamic Sitting Balance Sitting balance - Comments: pt sat EOB for 10 mins, limited by nausea and double vision, mod A, noted R gaze pref, trunk flexion, head held to L with R rotation, mod A to attempt to correct with cues however unable to maintain, lateral lean to R. Extremity Assessment      RLE Assessment RLE Assessment: Exceptions to [facility] Passive Range of Motion (PROM) Comments: WFL assessed in supine position Active Range of Motion (AROM) Comments: limited 2/2 weakness however unable to  achieve full ROM actively with verbal cueing General Strength Comments: pt very limited during eval with nausea and demonstrated decreased ability to participate fully RLE Strength Right Hip Flexion: 3-/5 Right Hip ABduction: 2+/5 Right Hip ADduction: 3-/5 Right Knee Extension: 3-/5 Right Ankle Dorsiflexion: 2+/5 Right Ankle Plantar Flexion: 2+/5 LLE Assessment LLE Assessment: Exceptions to [facility] Passive Range of Motion (PROM) Comments: WFL assessed in supine Active Range of Motion (AROM) Comments: limited 2/2 nausea and decreased participate with severe nausea LLE Strength Left Hip Flexion: 2/5 Left Hip ABduction: 2/5 Left Hip ADduction: 2/5 Left Knee Extension: 2/5 Left Ankle Dorsiflexion: 1/5 Left Ankle Plantar Flexion: 1/5  Care Tool Care Tool Bed Mobility Roll left and right  activity   Roll left and right assist level: Maximal Assistance - Patient 25 - 49%    Sit to lying activity   Sit to lying assist level: Maximal Assistance - Patient 25 - 49%    Lying to sitting edge of bed activity   Lying to sitting edge of bed assist level: Maximal Assistance - Patient 25 - 49%     Care Tool Transfers Sit to stand transfer   Sit to stand assist level: 2 Helpers    Chair/bed transfer Chair/bed transfer activity did not occur: Safety/medical concerns       Materials engineer transfer activity did not occur: Safety/medical concerns      Scientist, product/process development transfer activity did not occur: Safety/medical concerns        Care Tool Locomotion Ambulation Ambulation activity did not occur: Safety/medical concerns        Walk 10 feet activity Walk 10 feet activity did not occur: Safety/medical concerns       Walk 50 feet with 2 turns activity Walk 50 feet with 2 turns activity did not occur: Safety/medical concerns      Walk 150 feet activity Walk 150 feet activity did not occur: Safety/medical concerns      Walk 10 feet on uneven surfaces activity Walk 10 feet on uneven surfaces activity did not occur: Safety/medical concerns      Stairs Stair activity did not occur: Safety/medical concerns        Walk up/down 1 step activity Walk up/down 1 step or curb (drop down) activity did not occur: Safety/medical concerns     Walk up/down 4 steps activity did not occuR: Safety/medical concerns  Walk up/down 4 steps activity      Walk up/down 12 steps activity Walk up/down 12 steps activity did not occur: Safety/medical concerns      Pick up small objects from floor Pick up small object from the floor (from standing position) activity did not occur: Safety/medical concerns      Wheelchair Will patient use wheelchair at discharge?: Yes Type of Wheelchair: Manual Wheelchair activity did not occur: Safety/medical concerns (pt unable to participate in [facility]  assessment this date 2/2 severe nausea and vomiting and unable to transfer OOB at this time.)      Wheel 50 feet with 2 turns activity Wheelchair 50 feet with 2 turns activity did not occur: Safety/medical concerns Assist Level: Maximal Assistance - Patient 25 - 49%  Wheel 150 feet activity Wheelchair 150 feet activity did not occur: Safety/medical concerns      Refer to Care Plan for Long Term Goals  SHORT TERM GOAL WEEK 1 PT Short Term Goal 1 (Week 1): pt to tolerate OOB upright position for 2 hours PT Short Term Goal 2 (Week 1): pt to demonstrate  supine<>sit mod A PT Short Term Goal 3 (Week 1): pt to demonstrate sitting balance CGA PT Short Term Goal 4 (Week 1): pt to demonstrate sit<>stand transfers with LRAD at min A PT Short Term Goal 5 (Week 1): initiate gait training  Recommendations for other services: Neuropsych and Therapeutic Recreation  Stress management  Skilled Therapeutic Intervention  Evaluation completed (see details above and below) with education on PT POC and goals and individual treatment initiated with focus on bed mobility, sitting and standing balance, transfer training, gait training, WC mobility training, stair training, strengthening. pt received in bed and agreeable to therapy with daughter present however reported being exteremly nauseous this AM. Pt also reported double vision, reported improvement when PT covered one eye at a time. Pt agreeable to attempt sit>supine to EOB however with attempting to mobilize, noted decreased awareness of left upper extremity with pt asking where her left arm was, with max VC and tactile stimulation pt able to mobilize left upper extremity with min A; Left lower extremity similar process noted. On right upper and lower extremity noted poor motor control with alien arm noted and paired pattern movement with Right lower extremity as well. Pt also unable to feel painful stimuli on Bilateral lower extremity during examination. Pt  directed in rolling to R, max A, sit>supine max A and sat EOB for at least 10 mins mod A grossly with verbal cues for improved posture, midline position. Pt required manual facilitation for trunk extension, midline position, weight bearing on left side, noted R lean and gaze preference with limited improved with cues. Attempted to direct pt in Sit to stand however pt unable to clear bed and overall dependent for this. Pt requested to return to bed with increased dizziness and nausea with prolonged sitting EOB. Pt directed in sit>supine max A for trunk and Bilateral lower extremity management. Pt left in supine, daughter present, alarm set, All needs in reach and in good condition. Call light in hand.   Pt and daughter educated on increasing stimulation to pt's left side and PT POC.    Mobility Bed Mobility Bed Mobility: Rolling Right;Rolling Left;Sit to Supine;Supine to Sit;Scooting to [facility];Sitting - Scoot to Edge of Bed Rolling Right: Maximal Assistance - Patient 25-49% Rolling Left: Maximal Assistance - Patient 25-49% Supine to Sit: Maximal Assistance - Patient - Patient 25-49% Sitting - Scoot to Edge of Bed: Maximal Assistance - Patient 25-49% Sit to Supine: Maximal Assistance - Patient 25-49% Scooting to [facility]: Dependent - Patient equal 0% Transfers Transfers: Sit to Stand;Lateral/Scoot Transfers Sit to Stand: Dependent - Patient 0%;2 Helpers Lateral/Scoot Transfers: Dependent - Patient 0% Transfer (Assistive device): 1 person hand held assist Locomotion  Gait Ambulation: No Gait Gait: No Stairs / Additional Locomotion Stairs: No Wheelchair Mobility Wheelchair Mobility: No (WC mobility would be beneficial for pt however limited 2/2 nasuea)   Discharge Criteria: Patient will be discharged from PT if patient refuses treatment 3 consecutive times without medical reason, if treatment goals not met, if there is a change in medical status, if patient makes no progress towards goals or if  patient is discharged from hospital.  The above assessment, treatment plan, treatment alternatives and goals were discussed and mutually agreed upon: by patient and by family  Junie Panning 11/10/2019, 9:02 AM

## 2019-11-10 NOTE — Evaluation (Signed)
Occupational Therapy Assessment and Plan  Patient Details  Name: Chelsea [name] MRN: [id number] Date of Birth: 02-28-1945  OT Diagnosis: abnormal posture, acute pain, cognitive deficits, disturbance of vision, hemiplegia affecting dominant side, muscle weakness (generalized) and pain in joint Rehab Potential:   ELOS: 3-4 weeks   Today's Date: 11/10/2019 OT Individual Time: 8144-8185 OT Individual Time Calculation (min): 70 min     Hospital Problem: Active Problems:   Acute ischemic right posterior cerebral artery (PCA) stroke [facility])   Past Medical History:  Past Medical History:  Diagnosis Date  . Blind left eye   . Cardiac arrest ([place])   . Diabetes mellitus without complication ([place])   . Hypertension   . Stroke [facility])    Past Surgical History: History reviewed. No pertinent surgical history.  Assessment & Plan Clinical Impression: 74 y/o female presenting with 1 week history of frequent falls, subtle right lower extremity weakness, L visual deficits, nausea and vomiting. MRI shows R hippocampus infarct. PMH includes HTN (uncontrolled, has not seen an MD in 15 years), DM-2, left carotid artery stenosis  Patient currently requires total with basic self-care skills secondary to muscle weakness, decreased cardiorespiratoy endurance, impaired timing and sequencing, unbalanced muscle activation, motor apraxia, decreased coordination and decreased motor planning, decreased visual acuity and decreased visual perceptual skills, decreased motor planning, decreased attention, decreased awareness, decreased problem solving, decreased safety awareness, decreased memory and delayed processing and decreased sitting balance, decreased standing balance, decreased postural control, hemiplegia and decreased balance strategies.  Prior to hospitalization, patient could complete BADL with mod.  Patient will benefit from skilled intervention to decrease level of assist with basic self-care skills and  increase independence with basic self-care skills prior to discharge home with care partner.  Anticipate patient will require 24 hour supervision and moderate physical assestance and follow up home health.  OT - End of Session Endurance Deficit: Yes OT Assessment OT Barriers to Discharge: Incontinence OT Patient demonstrates impairments in the following area(s): Balance;Cognition;Endurance;Motor;Nutrition;Pain;Perception;Safety;Sensory;Vision OT Basic ADL's Functional Problem(s): Grooming;Bathing;Dressing;Toileting;Eating OT Transfers Functional Problem(s): Toilet;Tub/Shower OT Additional Impairment(s): Fuctional Use of Upper Extremity OT Plan OT Intensity: Minimum of 1-2 x/day, 45 to 90 minutes OT Frequency: 5 out of 7 days OT Duration/Estimated Length of Stay: 3-4 weeks OT Treatment/Interventions: Balance/vestibular training;Discharge planning;Pain management;Self Care/advanced ADL retraining;Therapeutic Activities;UE/LE Coordination activities;Visual/perceptual remediation/compensation;Therapeutic Exercise;Skin care/wound managment;Patient/family education;Functional mobility training;Disease mangement/prevention;Cognitive remediation/compensation;Community reintegration;DME/adaptive equipment instruction;Neuromuscular re-education;Psychosocial support;Splinting/orthotics;UE/LE Strength taining/ROM;Wheelchair propulsion/positioning OT Self Feeding Anticipated Outcome(s): MIN A OT Basic Self-Care Anticipated Outcome(s): MOD A bathing/dressing OT Toileting Anticipated Outcome(s): MAX A toileting; MOD A shower OT Bathroom Transfers Anticipated Outcome(s): MIN A OT Recommendation Recommendations for Other Services: Neuropsych consult Patient destination: Home Follow Up Recommendations: Home health OT Equipment Recommended: Tub/shower bench;3 in 1 bedside comode;To be determined Equipment Details: Drop arm commode   OT Evaluation Precautions/Restrictions  Precautions Precautions:  Fall Restrictions Weight Bearing Restrictions: No General Chart Reviewed: Yes Family/Caregiver Present: Yes Vital Signs  Pain Pain Assessment Pain Scale: 0-10 Pain Score: 0-No pain Home Living/Prior Functioning Home Living Family/patient expects to be discharged to:: Private residence Living Arrangements: Children Available Help at Discharge: Family, Available 24 hours/day Type of Home: House Home Access: Stairs to enter [employer] of Steps: 4 Entrance Stairs-Rails: Can reach both Home Layout: One level Bathroom Shower/Tub: Tub/shower unit, Architectural technologist: Handicapped height Additional Comments: has a friend installing rails throughout bathroom; has adjustable bed; lift chair  Lives With: Family Prior Function Level of Independence: Needs assistance with gait, Needs assistance with  tranfers, Needs assistance with ADLs, Needs assistance with homemaking  Able to Take Stairs?: Yes Driving: No Comments: pt's daughter reports pt requires assistance for all ADLs and was walking at home while family worked during day however had many falls and usually stayed [place] during the days Vision Baseline Vision/History: No visual deficits Patient Visual Report: Diplopia;Peripheral vision impairment Vision Assessment?: Yes Ocular Range of Motion: Within Functional Limits Alignment/Gaze Preference: Gaze right Tracking/Visual Pursuits: Decreased smoothness of eye movement to LEFT inferior field;Decreased smoothness of eye movement to LEFT superior field;Impaired - to be further tested in functional context;Requires cues, head turns, or add eye shifts to track Visual Fields: Left visual field deficit;Impaired-to be further tested in functional context Diplopia Assessment: Disappears with one eye closed (per pt) Additional Comments: L visual field cut and possible L side neglext as well. Right downward gaze preference sitting EOB Perception  Perception:  Impaired Praxis Praxis: Impaired Cognition Overall Cognitive Status: Impaired/Different from baseline Arousal/Alertness: Lethargic Orientation Level: Person;Place;Situation Person: Oriented Place: Oriented Situation: Oriented Year: 2021 Month: October Day of Week: Incorrect Memory: Impaired Immediate Memory Recall: Sock;Blue;Bed Memory Recall Sock: Not able to recall Memory Recall Blue: Not able to recall Memory Recall Bed: Not able to recall Awareness: Appears intact Problem Solving: Impaired Problem Solving Impairment: Verbal complex;Verbal basic Executive Function: Sequencing;Decision Making;Initiating;Reasoning Reasoning: Impaired Sequencing: Impaired Decision Making: Impaired Initiating: Impaired Safety/Judgment: Impaired Sensation Sensation Light Touch: Impaired by gross assessment Light Touch Impaired Details: Impaired RLE;Impaired LLE (pt unable 5 attempts in upper leg, lower leg, feet in BLE) Proprioception: Impaired by gross assessment Stereognosis: Impaired by gross assessment Coordination Gross Motor Movements are Fluid and Coordinated: No Fine Motor Movements are Fluid and Coordinated: No Motor  Motor Motor: Hemiplegia (L LE)  Trunk/Postural Assessment  Cervical Assessment Cervical Assessment: Exceptions to [facility] Thoracic Assessment Thoracic Assessment: Exceptions to [facility] Lumbar Assessment Lumbar Assessment: Exceptions to [facility] Postural Control Postural Control: Deficits on evaluation (forward head carriage, kyphotic, posterior pelvic tilt)  Balance Balance Balance Assessed: Yes Dynamic Sitting Balance Sitting balance - Comments: pt sat EOB for 10 mins, limited by nausea and double vision, mod A, noted R gaze pref, trunk flexion, head held to L with R rotation, mod A to attempt to correct with cues however unable to maintain, lateral lean to R. Extremity/Trunk Assessment RUE Assessment RUE Assessment: Exceptions to [facility] General Strength Comments:  generalized weakness, dysmetria/decreased coordiantion LUE Assessment LUE Assessment: Exceptions to [facility] General Strength Comments: generalized weakness/discoordination/dysmetria LUE Body System: Neuro Brunstrum levels for arm and hand: Arm;Hand Brunstrum level for arm: Stage IV Movement is deviating from synergy Brunstrum level for hand: Stage V Independence from basic synergies  Care Tool Care Tool Self Care Eating        Oral Care    Oral Care Assist Level: Dependent - Patient 0%) (per daughter)    Bathing   Body parts bathed by patient: Chest;Abdomen;Face Body parts bathed by helper: Right arm;Left arm;Front perineal area;Right upper leg;Buttocks;Left upper leg;Right lower leg;Left lower leg   Assist Level: 2 Helpers    Upper Body Dressing(including orthotics)       Assist Level: Total Assistance - Patient < 25%    Lower Body Dressing (excluding footwear)   What is the patient wearing?: Incontinence brief Assist for lower body dressing: 2 Helpers    Putting on/Taking off footwear   What is the patient wearing?: Non-skid slipper socks Assist for footwear: Dependent - Patient 0%       Care Tool Toileting Toileting  activity         Care Tool Bed Mobility Roll left and right activity   Roll left and right assist level: Maximal Assistance - Patient 25 - 49%    Sit to lying activity   Sit to lying assist level: Maximal Assistance - Patient 25 - 49%    Lying to sitting edge of bed activity   Lying to sitting edge of bed assist level: Maximal Assistance - Patient 25 - 49%     Care Tool Transfers Sit to stand transfer   Sit to stand assist level: 2 Helpers    Chair/bed transfer Chair/bed transfer activity did not occur: Safety/medical concerns       Toilet transfer Toilet transfer activity did not occur: Safety/medical concerns       Care Tool Cognition Expression of Ideas and Wants Expression of Ideas and Wants: Some difficulty - exhibits some difficulty  with expressing needs and ideas (e.g, some words or finishing thoughts) or speech is not clear   Understanding Verbal and Non-Verbal Content Understanding Verbal and Non-Verbal Content: Usually understands - understands most conversations, but misses some part/intent of message. Requires cues at times to understand   Memory/Recall Ability *first 3 days only      Refer to Care Plan for Long Term Goals  SHORT TERM GOAL WEEK 1 OT Short Term Goal 1 (Week 1): Pt will sit EOB/EOM for 5 min with MOD A for sitting balance OT Short Term Goal 2 (Week 1): Pt will transfer to w/c in prep for BSC/toilet transfer wiht MAX A of 1 OT Short Term Goal 3 (Week 1): Pt will complete 1/4 steps of UB dressing OT Short Term Goal 4 (Week 1): Pt will groom seated at sink with MOD A  Recommendations for other services: None    Skilled Therapeutic Intervention 1:1. Pt and daughter presnt for evaluation. OT edu re OT role/purpose, CIR, ELOS, POC and pt/caregiver centered goal setting. Pt participates in BADL at bed level washing in supported chiar position and donning new hopsital gown with significantly increased time and 1 episode of dry heaves. Pt limited significantly by nausea which increases when eyes are open/people walking around the room. OT spends great amount of time discussing with daughter PLOF, home set up, DME and DC plan. Daughter is a retired Scientist, physiological and [employer] transfers. Goal setting completed with pt and family for realistic expectations for home since steady decline since April. Provided wide DAC for toileting when appropriate. Exited session with tp seated in bed, exit alarmon and call light tin reach  ADL ADL Grooming: Dependent Where Assessed-Grooming: Bed level Upper Body Bathing: Maximal assistance Where Assessed-Upper Body Bathing: Bed level Lower Body Bathing: Dependent Where Assessed-Lower Body Bathing: Bed level Upper Body Dressing: Dependent Where  Assessed-Upper Body Dressing: Bed level Lower Body Dressing: Dependent Where Assessed-Lower Body Dressing: Bed level Toileting: Unable to assess Toilet Transfer: Unable to assess Mobility  Bed Mobility Bed Mobility: Rolling Right;Rolling Left;Sit to Supine;Supine to Sit;Scooting to [facility];Sitting - Scoot to Edge of Bed Rolling Right: Maximal Assistance - Patient 25-49% Rolling Left: Maximal Assistance - Patient 25-49% Supine to Sit: Maximal Assistance - Patient - Patient 25-49% Sitting - Scoot to Edge of Bed: Maximal Assistance - Patient 25-49% Sit to Supine: Maximal Assistance - Patient 25-49% Scooting to [facility]: Dependent - Patient equal 0% Transfers Sit to Stand: Dependent - Patient 0%;2 Helpers   Discharge Criteria: Patient will be discharged from OT if patient refuses  treatment 3 consecutive times without medical reason, if treatment goals not met, if there is a change in medical status, if patient makes no progress towards goals or if patient is discharged from hospital.  The above assessment, treatment plan, treatment alternatives and goals were discussed and mutually agreed upon: by patient  Tonny Branch 11/10/2019, 11:00 AM

## 2019-11-10 NOTE — Plan of Care (Signed)
°  Problem: Consults Goal: RH STROKE PATIENT EDUCATION Description: See Patient Education module for education specifics  Outcome: Progressing Goal: Nutrition Consult-if indicated Outcome: Progressing Goal: Diabetes Guidelines if Diabetic/Glucose > 140 Description: If diabetic or lab glucose is > 140 mg/dl - Initiate Diabetes/Hyperglycemia Guidelines & Document Interventions  Outcome: Progressing   Problem: RH BOWEL ELIMINATION Goal: RH STG MANAGE BOWEL WITH ASSISTANCE Description: STG Manage Bowel with Assistance. Outcome: Progressing   Problem: RH BLADDER ELIMINATION Goal: RH STG MANAGE BLADDER WITH ASSISTANCE Description: STG Manage Bladder With Assistance Outcome: Progressing   Problem: RH SKIN INTEGRITY Goal: RH STG SKIN FREE OF INFECTION/BREAKDOWN Outcome: Progressing Goal: RH STG MAINTAIN SKIN INTEGRITY WITH ASSISTANCE Description: STG Maintain Skin Integrity With min Assistance. Outcome: Progressing   Problem: RH SAFETY Goal: RH STG ADHERE TO SAFETY PRECAUTIONS W/ASSISTANCE/DEVICE Description: STG Adhere to Safety Precautions With Assistance/Device. Outcome: Progressing   Problem: RH PAIN MANAGEMENT Goal: RH STG PAIN MANAGED AT OR BELOW PT'S PAIN GOAL Outcome: Progressing   Problem: RH KNOWLEDGE DEFICIT Goal: RH STG INCREASE KNOWLEDGE OF DIABETES Outcome: Progressing Goal: RH STG INCREASE KNOWLEDGE OF HYPERTENSION Outcome: Progressing Goal: RH STG INCREASE KNOWLEGDE OF HYPERLIPIDEMIA Outcome: Progressing Goal: RH STG INCREASE KNOWLEDGE OF STROKE PROPHYLAXIS Outcome: Progressing

## 2019-11-10 NOTE — Progress Notes (Signed)
[facility] PHYSICAL MEDICINE & REHABILITATION PROGRESS NOTE   Subjective/Complaints: Pt with ongoing nausea/vomiting even with minimal movement. Daughter at bedside  ROS: Limited due to cognitive/behavioral   Objective:   No results found. Recent Labs    11/08/19 0406 11/10/19 0500  WBC 12.7* 11.0*  HGB 13.0 12.6  HCT 38.6 37.4  PLT 311 301   Recent Labs    11/08/19 0406 11/10/19 0500  NA 137  --   K 3.5  --   CL 103  --   CO2 25  --   GLUCOSE 165*  --   BUN 14  --   CREATININE 1.04* 1.10*  CALCIUM 9.2  --     Intake/Output Summary (Last 24 hours) at 11/10/2019 1057 Last data filed at 11/10/2019 0900 Gross per 24 hour  Intake 120 ml  Output 400 ml  Net -280 ml        Physical Exam: Vital Signs Blood pressure (!) 158/52, pulse (!) 59, temperature 98.4 F (36.9 C), temperature source Oral, resp. rate 18, weight 62 kg, SpO2 95 %. Gen: no distress, normal appearing HEENT: oral mucosa pink and moist, NCAT Cardio: Reg rate Chest: normal effort, normal rate of breathing Abd: soft, non-distended Ext: no edema Skin: intact Neuro: pt in bed with eyes closed. Does follow commands. Poor visual acuity/depth perception when eyes are open. Moved all 4 limbs inconsistently. Senses pain in all 4's.  Musculoskeletal: Psych: flat, subdued    Assessment/Plan: 1. Functional deficits secondary to right PICA infarct which require 3+ hours per day of interdisciplinary therapy in a comprehensive inpatient rehab setting.  Physiatrist is providing close team supervision and 24 hour management of active medical problems listed below.  Physiatrist and rehab team continue to assess barriers to discharge/monitor patient progress toward functional and medical goals  Care Tool:  Bathing              Bathing assist       Upper Body Dressing/Undressing Upper body dressing        Upper body assist      Lower Body Dressing/Undressing Lower body dressing             Lower body assist       Toileting Toileting    Toileting assist       Transfers Chair/bed transfer  Transfers assist  Chair/bed transfer activity did not occur: Safety/medical concerns        Locomotion Ambulation   Ambulation assist   Ambulation activity did not occur: Safety/medical concerns          Walk 10 feet activity   Assist  Walk 10 feet activity did not occur: Safety/medical concerns        Walk 50 feet activity   Assist Walk 50 feet with 2 turns activity did not occur: Safety/medical concerns         Walk 150 feet activity   Assist Walk 150 feet activity did not occur: Safety/medical concerns         Walk 10 feet on uneven surface  activity   Assist Walk 10 feet on uneven surfaces activity did not occur: Safety/medical concerns         Wheelchair     Assist Will patient use wheelchair at discharge?: Yes Type of Wheelchair: Manual Wheelchair activity did not occur: Safety/medical concerns (pt unable to participate in [facility] assessment this date 2/2 severe nausea and vomiting and unable to transfer OOB at this time.)  Wheelchair 50 feet with 2 turns activity    Assist    Wheelchair 50 feet with 2 turns activity did not occur: Safety/medical concerns   Assist Level: Maximal Assistance - Patient 25 - 49%   Wheelchair 150 feet activity     Assist  Wheelchair 150 feet activity did not occur: Safety/medical concerns       Blood pressure (!) 158/52, pulse (!) 59, temperature 98.4 F (36.9 C), temperature source Oral, resp. rate 18, weight 62 kg, SpO2 95 %.  Medical Problem List and Plan: 1.  Decreased functional mobility with falls loss of vision left peripheral field secondary to right PCA infarction in the setting of left vertebral artery occlusion along with severe left ICA stenosis with previously silent subacute left parietal infarct secondary to large vessel disease.             -patient may  shower             -ELOS/Goals: 12-16 days/Min/Mod A             beginning therapies today 2.  Antithrombotics: -DVT/anticoagulation: Lovenox             -antiplatelet therapy: Aspirin 81 mg daily and Plavix 25 mg daily x3 months then Plavix alone 3. Pain Management: Lyrica 25 mg daily 4. Mood: Provide emotional support, family supportive             -antipsychotic agents: N/A 5. Neuropsych: This patient is capable of making decisions on her own behalf. 6. Skin/Wound Care: Routine skin checks 7. Fluids/Electrolytes/Nutrition: Routine in and outs. CMP ordered. 8.  Hypertension.  Coreg 12.5 mg twice daily.  Monitor with increased mobility.   -controlled 10/30 9.  Diabetes mellitus peripheral neuropathy.  Hemoglobin A1c 10.1.  SSI.  Patient on Glucophage 500 mg twice daily prior to admission.  Resume as needed             labile readings so far.   10/30 hesitate to add glucophage given nausea levels currently  -cover with SSI for now 10.  Hyperlipidemia.  Lipitor 12.  Tobacco abuse.  Counseling 13.  Vestibular sx: add scopolamine patch.   -scheduled compazine  -prn zofran  -acclimation, OOB  -spoke to daughter about this    LOS: 1 days A FACE TO FACE EVALUATION WAS PERFORMED  Ranelle Oyster 11/10/2019, 10:57 AM

## 2019-11-10 NOTE — Evaluation (Addendum)
Speech Language Pathology Assessment and Plan  Patient Details  Name: Chelsea [name] MRN: [id number] Date of Birth: [birth year]  SLP Diagnosis: Cognitive Impairments  Rehab Potential: Fair ELOS: 21-28 days    Today's Date: 11/10/2019 SLP Individual Time: 1430-1510 SLP Individual Time Calculation (min): 40 min   Hospital Problem: Active Problems:   Acute ischemic right posterior cerebral artery (PCA) stroke [facility])  Past Medical History:  Past Medical History:  Diagnosis Date  . Blind left eye   . Cardiac arrest ([place])   . Diabetes mellitus without complication ([place])   . Hypertension   . Stroke [facility])    Past Surgical History: History reviewed. No pertinent surgical history.  Assessment / Plan / Recommendation Clinical Impression   Chelsea [name]. [name] is a 74 year old right-handed female with history of hypertension, diabetes mellitus, left carotid stenosis 80% was scheduled for carotid enterectomy in late November with Dr. Donnetta Hutching, tobacco abuse,  medical noncompliance.  Per chart review independent with assistive device prior to admission.  1 level home 4 steps to entry.  She lives with her children and assistance as needed.  Presented 11/07/2019 with intractable nausea and vomiting as well as loss of vision left peripheral field.  Noted blood pressure 216/79.  Patient did have a CT of the head 10/30/2019 after a fall showing no evidence of acute changes.  MRI showed acute versus subacute infarct in the right hyper campus without hemorrhage.  Mild chronic ischemic changes.  CT angiogram of the head and neck ill-defined hypodensity right medial temporal lobe progressed since recent CT of 10/30/2019 most consistent with subacute infarction.  Occlusion left vertebral artery at the origin 80% focal noncalcified stenosis proximal left ICA artery.  Admission chemistries unremarkable except glucose 345 BUN 26 urine culture multiple species troponin negative hemoglobin A1c 10.1.  Echocardiogram with  ejection fraction of 60 to 65% no wall motion abnormalities grade 1 diastolic dysfunction.  Maintained on aspirin and Plavix for CVA prophylaxis x3 months then Plavix alone.  Subcutaneous Lovenox for DVT prophylaxis.  Hospital course urinary retention urology consulted Foley catheter tube was placed 11/09/2019 with plans for a voiding trial as patient's mobility improved.  Tolerating a regular diet.  Therapy evaluations completed and patient was admitted for a comprehensive rehab program. Please see preadmission and consult from earlier today as well.  SLP evaluation was completed on 11/10/2019 with results as follows: Today's evaluation was limited secondary to nausea, vomiting, and sensitivity to light and motion which resulted in pt needing to keep her eyes closed for the duration of today's appointment.  Pt's daughter mentioned noticing changes in memory and pt was noted to repeat herself during conversations and forgot that her nurse had left the room briefly to get her medication.  Pt also appeared to have decreased sustained attention to conversations and tasks; however, both of these deficits could also be attributed to pt feeling poorly secondary to significant vestibular versus visual versus other symptoms.  As a result, pt would benefit from skilled ST while inpatient for ongoing diagnostic treatment of cognition in order to maximize functional independence and reduce burden of care prior to discharge.  Anticipate that pt will need 24/7 supervision at discharge in addition to [place] follow up at next level of care.      Skilled Therapeutic Interventions          Cognitive-linguistic evaluation completed with results and recommendations reviewed with patient and family.  SLP Assessment  Patient will need skilled [facility] during  CIR admission    Recommendations  Patient destination: Home Follow up Recommendations: 24 hour supervision/assistance;Home Health SLP Equipment  Recommended: None recommended by SLP    SLP Frequency 3 to 5 out of 7 days   SLP Duration  SLP Intensity  SLP Treatment/Interventions 21-28 days  Minumum of 1-2 x/day, 30 to 90 minutes  Cognitive remediation/compensation;Cueing hierarchy;Internal/external aids;Patient/family education;Functional tasks    Pain Pain Assessment Pain Scale: 0-10 Pain Score: 0-No pain  Prior Functioning Type of Home: House  Lives With: Family Available Help at Discharge: Family;Available 24 hours/day  SLP Evaluation Cognition Overall Cognitive Status: Impaired/Different from baseline Arousal/Alertness: Awake/alert Orientation Level: Oriented X4 Attention: Sustained Sustained Attention: Impaired Sustained Attention Impairment: Functional basic;Verbal basic Memory: Impaired Memory Impairment: Retrieval deficit;Decreased recall of new information Awareness: Appears intact  Comprehension Auditory Comprehension Overall Auditory Comprehension: Appears within functional limits for tasks assessed Expression Expression Primary Mode of Expression: Verbal Verbal Expression Overall Verbal Expression: Appears within functional limits for tasks assessed Oral Motor Oral Motor/Sensory Function Overall Oral Motor/Sensory Function: Within functional limits Motor Speech Overall Motor Speech: Appears within functional limits for tasks assessed  Care Tool Care Tool Cognition Expression of Ideas and Wants Expression of Ideas and Wants: Some difficulty - exhibits some difficulty with expressing needs and ideas (e.g, some words or finishing thoughts) or speech is not clear   Understanding Verbal and Non-Verbal Content Understanding Verbal and Non-Verbal Content: Usually understands - understands most conversations, but misses some part/intent of message. Requires cues at times to understand   Memory/Recall Ability *first 3 days only Memory/Recall Ability *first 3 days only: That he or she is in a  hospital/hospital unit    Short Term Goals: Week 1: SLP Short Term Goal 1 (Week 1): Pt will utilize external aids to facilitate recall of daily information with min assist multimodal cues. SLP Short Term Goal 2 (Week 1): Pt will sustain her attention to basic, famiiar tasks for ~5 minute intervals with min assist verbal cues for redirection to task.  Refer to Care Plan for Long Term Goals  Recommendations for other services: None   Discharge Criteria: Patient will be discharged from SLP if patient refuses treatment 3 consecutive times without medical reason, if treatment goals not met, if there is a change in medical status, if patient makes no progress towards goals or if patient is discharged from hospital.  The above assessment, treatment plan, treatment alternatives and goals were discussed and mutually agreed upon: by patient and by family  Dilara Navarrete, Selinda Orion 11/10/2019, 3:22 PM

## 2019-11-11 LAB — GLUCOSE, CAPILLARY
Glucose-Capillary: 159 mg/dL — ABNORMAL HIGH (ref 70–99)
Glucose-Capillary: 159 mg/dL — ABNORMAL HIGH (ref 70–99)
Glucose-Capillary: 132 mg/dL — ABNORMAL HIGH (ref 70–99)
Glucose-Capillary: 147 mg/dL — ABNORMAL HIGH (ref 70–99)

## 2019-11-11 MED ORDER — LIVING WELL WITH DIABETES BOOK
Freq: Once | Status: AC
  Filled 2019-11-11: qty 1

## 2019-11-11 MED ORDER — BISACODYL 10 MG RE SUPP
10.0000 mg | Freq: Every day | RECTAL | Status: DC | PRN
  Administered 2019-11-11 – 2019-11-28 (×4): 10 mg via RECTAL
  Filled 2019-11-11 (×4): qty 1

## 2019-11-11 MED ORDER — FLEET ENEMA 7-19 GM/118ML RE ENEM: 1.0000 | ENEMA | Freq: Every day | RECTAL | Status: DC | PRN

## 2019-11-11 MED ORDER — BLOOD PRESSURE CONTROL BOOK
Freq: Once | Status: AC
  Filled 2019-11-11: qty 1

## 2019-11-11 NOTE — Progress Notes (Signed)
Pt reported feeling uncomfortable and bloated. Dr. Riley Kill made aware and order obtained for dulcolax suppository. Assessed for impaction at 1335 and administered suppository. Pt then required disimpaction at 1415 with moderate amount of hard lumps of stool. Assisted pt up to [facility] using stedy+2 at 1520 and able to move hard lumps of stool again. Pt was anxious and c/o of nausea and dizziness throughout. Scheduled compazine and PRN Zofran given. Pt states feeling better at this time and resting in bed. Daughter is at bedside comforting pt. Continue plan of care.   Marylu Lund, RN

## 2019-11-11 NOTE — Progress Notes (Signed)
[facility] PHYSICAL MEDICINE & REHABILITATION PROGRESS NOTE   Subjective/Complaints: Still a lot of dizziness and nausea but feeling better today. Does better in supine when head's turned sl to left.   ROS: Patient denies fever, rash, sore throat,  diarrhea, cough, shortness of breath or chest pain, joint or back pain, headache, or mood change.    Objective:   No results found. Recent Labs    11/10/19 0500  WBC 11.0*  HGB 12.6  HCT 37.4  PLT 301   Recent Labs    11/10/19 0500  CREATININE 1.10*    Intake/Output Summary (Last 24 hours) at 11/11/2019 1005 Last data filed at 11/10/2019 1859 Gross per 24 hour  Intake 100 ml  Output --  Net 100 ml        Physical Exam: Vital Signs Blood pressure 134/62, pulse (!) 58, temperature 98.1 F (36.7 C), resp. rate 18, weight 63.7 kg, SpO2 94 %. Constitutional: appears more comfortable today. Vital signs reviewed. HEENT: EOMI, oral membranes moist Neck: supple Cardiovascular: RRR without murmur. No JVD    Respiratory/Chest: CTA Bilaterally without wheezes or rales. Normal effort    GI/Abdomen: BS +, non-tender, non-distended Ext: no clubbing, cyanosis, or edema Psych: flat but much more engaging Neuro: eyes opened. Follows commands. Initiates conversation and demonstrates fair insight and awareness. Poor visual acuity/depth perception when eyes are open. Moved all 4 limbs inconsistently. Senses pain in all 4's.  Musculoskeletal:     Assessment/Plan: 1. Functional deficits secondary to right PICA infarct which require 3+ hours per day of interdisciplinary therapy in a comprehensive inpatient rehab setting.  Physiatrist is providing close team supervision and 24 hour management of active medical problems listed below.  Physiatrist and rehab team continue to assess barriers to discharge/monitor patient progress toward functional and medical goals  Care Tool:  Bathing    Body parts bathed by patient: Chest, Abdomen,  Face   Body parts bathed by helper: Right arm, Left arm, Front perineal area, Right upper leg, Buttocks, Left upper leg, Right lower leg, Left lower leg     Bathing assist Assist Level: 2 Helpers     Upper Body Dressing/Undressing Upper body dressing        Upper body assist Assist Level: Total Assistance - Patient < 25%    Lower Body Dressing/Undressing Lower body dressing      What is the patient wearing?: Incontinence brief     Lower body assist Assist for lower body dressing: 2 Helpers     Toileting Toileting    Toileting assist       Transfers Chair/bed transfer  Transfers assist  Chair/bed transfer activity did not occur: Safety/medical concerns        Locomotion Ambulation   Ambulation assist   Ambulation activity did not occur: Safety/medical concerns          Walk 10 feet activity   Assist  Walk 10 feet activity did not occur: Safety/medical concerns        Walk 50 feet activity   Assist Walk 50 feet with 2 turns activity did not occur: Safety/medical concerns         Walk 150 feet activity   Assist Walk 150 feet activity did not occur: Safety/medical concerns         Walk 10 feet on uneven surface  activity   Assist Walk 10 feet on uneven surfaces activity did not occur: Safety/medical concerns         Wheelchair  Assist Will patient use wheelchair at discharge?: Yes Type of Wheelchair: Manual Wheelchair activity did not occur: Safety/medical concerns (pt unable to participate in [facility] assessment this date 2/2 severe nausea and vomiting and unable to transfer OOB at this time.)         Wheelchair 50 feet with 2 turns activity    Assist    Wheelchair 50 feet with 2 turns activity did not occur: Safety/medical concerns   Assist Level: Maximal Assistance - Patient 25 - 49%   Wheelchair 150 feet activity     Assist  Wheelchair 150 feet activity did not occur: Safety/medical concerns        Blood pressure 134/62, pulse (!) 58, temperature 98.1 F (36.7 C), resp. rate 18, weight 63.7 kg, SpO2 94 %.  Medical Problem List and Plan: 1.  Decreased functional mobility with falls loss of vision left peripheral field secondary to right PCA infarction in the setting of left vertebral artery occlusion along with severe left ICA stenosis with previously silent subacute left parietal infarct secondary to large vessel disease.             -patient may shower             -ELOS/Goals: 12-16 days/Min/Mod A            -Continue CIR therapies including PT, OT, and SLP  2.  Antithrombotics: -DVT/anticoagulation: Lovenox             -antiplatelet therapy: Aspirin 81 mg daily and Plavix 25 mg daily x3 months then Plavix alone 3. Pain Management: Lyrica 25 mg daily 4. Mood: Provide emotional support, family supportive             -antipsychotic agents: N/A 5. Neuropsych: This patient is capable of making decisions on her own behalf. 6. Skin/Wound Care: Routine skin checks 7. Fluids/Electrolytes/Nutrition: intake poor to inconsistent d/t nausea  -encouraged her to push po as much as she can. 8.  Hypertension.  Coreg 12.5 mg twice daily.  Monitor with increased mobility.   -controlled 10/31 9.  Diabetes mellitus peripheral neuropathy.  Hemoglobin A1c 10.1.  SSI.  Patient on Glucophage 500 mg twice daily prior to admission.  Resume as needed             labile readings so far.   10/30 won't add glucophage given nausea levels currently  10/31 continue to cover with SSI for now until PO intake more consistent 10.  Hyperlipidemia.  Lipitor 12.  Tobacco abuse.  Counseling 13.  Vestibular sx: added scopolamine patch with some improvement.   -scheduled compazine  -prn zofran  -acclimation, OOB  -10/31 spoke to pt/daughter. Encouraged her to work through symptoms and acclimate as much as she can.    LOS: 2 days A FACE TO FACE EVALUATION WAS PERFORMED  Ranelle Oyster 11/11/2019, 10:05 AM

## 2019-11-12 ENCOUNTER — Inpatient Hospital Stay (HOSPITAL_COMMUNITY): Payer: PPO

## 2019-11-12 ENCOUNTER — Inpatient Hospital Stay (HOSPITAL_COMMUNITY): Payer: PPO | Admitting: Physical Therapy

## 2019-11-12 LAB — CBC WITH DIFFERENTIAL/PLATELET
Abs Immature Granulocytes: 0.04 10*3/uL (ref 0.00–0.07)
Basophils Absolute: 0.1 10*3/uL (ref 0.0–0.1)
Basophils Relative: 1 %
Eosinophils Absolute: 0.2 10*3/uL (ref 0.0–0.5)
Eosinophils Relative: 1 %
HCT: 36.8 % (ref 36.0–46.0)
Hemoglobin: 12.3 g/dL (ref 12.0–15.0)
Immature Granulocytes: 0 %
Lymphocytes Relative: 23 %
Lymphs Abs: 2.4 10*3/uL (ref 0.7–4.0)
MCH: 31.7 pg (ref 26.0–34.0)
MCHC: 33.4 g/dL (ref 30.0–36.0)
MCV: 94.8 fL (ref 80.0–100.0)
Monocytes Absolute: 0.6 10*3/uL (ref 0.1–1.0)
Monocytes Relative: 6 %
Neutro Abs: 7.2 10*3/uL (ref 1.7–7.7)
Neutrophils Relative %: 69 %
Platelets: 322 10*3/uL (ref 150–400)
RBC: 3.88 MIL/uL (ref 3.87–5.11)
RDW: 12.2 % (ref 11.5–15.5)
WBC: 10.5 10*3/uL (ref 4.0–10.5)
nRBC: 0 % (ref 0.0–0.2)

## 2019-11-12 LAB — GLUCOSE, CAPILLARY
Glucose-Capillary: 136 mg/dL — ABNORMAL HIGH (ref 70–99)
Glucose-Capillary: 214 mg/dL — ABNORMAL HIGH (ref 70–99)
Glucose-Capillary: 193 mg/dL — ABNORMAL HIGH (ref 70–99)
Glucose-Capillary: 180 mg/dL — ABNORMAL HIGH (ref 70–99)

## 2019-11-12 LAB — COMPREHENSIVE METABOLIC PANEL
ALT: 38 U/L (ref 0–44)
AST: 37 U/L (ref 15–41)
Albumin: 2.9 g/dL — ABNORMAL LOW (ref 3.5–5.0)
Alkaline Phosphatase: 80 U/L (ref 38–126)
Anion gap: 11 (ref 5–15)
BUN: 19 mg/dL (ref 8–23)
CO2: 26 mmol/L (ref 22–32)
Calcium: 8.9 mg/dL (ref 8.9–10.3)
Chloride: 98 mmol/L (ref 98–111)
Creatinine, Ser: 1.11 mg/dL — ABNORMAL HIGH (ref 0.44–1.00)
GFR, Estimated: 52 mL/min — ABNORMAL LOW (ref >60)
Glucose, Bld: 225 mg/dL — ABNORMAL HIGH (ref 70–99)
Potassium: 4.2 mmol/L (ref 3.5–5.1)
Sodium: 135 mmol/L (ref 135–145)
Total Bilirubin: 0.7 mg/dL (ref 0.3–1.2)
Total Protein: 5.1 g/dL — ABNORMAL LOW (ref 6.5–8.1)

## 2019-11-12 NOTE — Progress Notes (Signed)
Patient ID: Chelsea [name], female   DOB: 1945/06/21, 74 y.o.   MRN: [id number] Met with the patient and her daughter to review the role of the nurse CM and discuss collaboration with the SW to address educational needs and prep for discharge. Reviewed risk factor includind DM and HTN and given handouts on dyslipidemia to go along with the handouts and educational materials provided on living well with DM, HTN and CMM diet. Reviewed questions and discussed concerns. Daughter noted 4 step entry with need for a ramp and given handouts for ramp building and resources for a ramp installation for discharge. No other questions or concerns noted at present. Continue to follow along to discharge for educational needs. Margarito Liner

## 2019-11-12 NOTE — Discharge Instructions (Signed)
Inpatient Rehab Discharge Instructions  Chelsea [name] Discharge date and time: No discharge date for patient encounter.   Activities/Precautions/ Functional Status: Activity: activity as tolerated Diet: regular diet Wound Care: Routine skin checks Functional status:  ___ No restrictions     ___ Walk up steps independently ___ 24/7 supervision/assistance   ___ Walk up steps with assistance ___ Intermittent supervision/assistance  ___ Bathe/dress independently ___ Walk with walker     _x__ Bathe/dress with assistance ___ Walk Independently    ___ Shower independently ___ Walk with assistance    ___ Shower with assistance ___ No alcohol     ___ Return to work/school ________  Special Instructions: No driving smoking or alcohol  Continue aspirin 81 mg daily and Plavix 75 mg daily x3 months then Plavix alone STROKE/TIA DISCHARGE INSTRUCTIONS SMOKING Cigarette smoking nearly doubles your risk of having a stroke & is the single most alterable risk factor  If you smoke or have smoked in the last 12 months, you are advised to quit smoking for your health.  Most of the excess cardiovascular risk related to smoking disappears within a year of stopping.  Ask you doctor about anti-smoking medications  [place] Quit Line: 1-800-QUIT NOW  Free Smoking Cessation Classes [phone]  CHOLESTEROL Know your levels; limit fat & cholesterol in your diet  Lipid Panel     Component Value Date/Time   CHOL 173 11/08/2019 0406   TRIG 82 11/08/2019 0406   HDL 36 (L) 11/08/2019 0406   CHOLHDL 4.8 11/08/2019 0406   VLDL 16 11/08/2019 0406   LDLCALC 121 (H) 11/08/2019 0406      Many patients benefit from treatment even if their cholesterol is at goal.  Goal: Total Cholesterol (CHOL) less than 160  Goal:  Triglycerides (TRIG) less than 150  Goal:  HDL greater than 40  Goal:  LDL (LDLCALC) less than 100   BLOOD PRESSURE American Stroke Association blood pressure target is less that 120/80 mm/Hg   Your discharge blood pressure is:  BP: (!) 130/55  Monitor your blood pressure  Limit your salt and alcohol intake  Many individuals will require more than one medication for high blood pressure  DIABETES (A1c is a blood sugar average for last 3 months) Goal HGBA1c is under 7% (HBGA1c is blood sugar average for last 3 months)  Diabetes: No known diagnosis of diabetes    Lab Results  Component Value Date   HGBA1C 10.1 (H) 11/07/2019     Your HGBA1c can be lowered with medications, healthy diet, and exercise.  Check your blood sugar as directed by your physician  Call your physician if you experience unexplained or low blood sugars.  PHYSICAL ACTIVITY/REHABILITATION Goal is 30 minutes at least 4 days per week  Activity: Increase activity slowly, Therapies: Physical Therapy: Home Health Return to work:   Activity decreases your risk of heart attack and stroke and makes your heart stronger.  It helps control your weight and blood pressure; helps you relax and can improve your mood.  Participate in a regular exercise program.  Talk with your doctor about the best form of exercise for you (dancing, walking, swimming, cycling).  DIET/WEIGHT Goal is to maintain a healthy weight  Your discharge diet is:  Diet Order            Diet regular Room service appropriate? Yes; Fluid consistency: Thin  Diet effective now                 liquids  Your height is:    Your current weight is: Weight: 63.7 kg Your Body Mass Index (BMI) is:  BMI (Calculated): 21.99  Following the type of diet specifically designed for you will help prevent another stroke.  Your goal weight range is:    Your goal Body Mass Index (BMI) is 19-24.  Healthy food habits can help reduce 3 risk factors for stroke:  High cholesterol, hypertension, and excess weight.  RESOURCES Stroke/Support Group:  Call [phone]   STROKE EDUCATION PROVIDED/REVIEWED AND GIVEN TO PATIENT Stroke warning signs and symptoms How to  activate emergency medical system (call 911). Medications prescribed at discharge. Need for follow-up after discharge. Personal risk factors for stroke. Pneumonia vaccine given:  Flu vaccine given:  My questions have been answered, the writing is legible, and I understand these instructions.  I will adhere to these goals & educational materials that have been provided to me after my discharge from the hospital.      My questions have been answered and I understand these instructions. I will adhere to these goals and the provided educational materials after my discharge from the hospital.  Patient/Caregiver Signature _______________________________ Date __________  Clinician Signature _______________________________________ Date __________  Please bring this form and your medication list with you to all your follow-up doctor's appointments.

## 2019-11-12 NOTE — Progress Notes (Signed)
Occupational Therapy Session Note  Patient Details  Name: Chelsea [name] MRN: [id number] Date of Birth: 08-20-1945  Today's Date: 11/12/2019 OT Individual Time: 1000-1054 OT Individual Time Calculation (min): 54 min    Short Term Goals: Week 1:  OT Short Term Goal 1 (Week 1): Pt will sit EOB/EOM for 5 min with MOD A for sitting balance OT Short Term Goal 2 (Week 1): Pt will transfer to w/c in prep for BSC/toilet transfer wiht MAX A of 1 OT Short Term Goal 3 (Week 1): Pt will complete 1/4 steps of UB dressing OT Short Term Goal 4 (Week 1): Pt will groom seated at sink with MOD A  Skilled Therapeutic Interventions/Progress Updates:    Pt received supine with her daughter present and involved throughout session. Supine BP 161/53. Pt c/o nausea, stating she thinks it is related to visual deficits in the L eye and some diplopia. An eye patch was provided and edu was provided re use. Pt was instructed in bed mobility strategies and was able to transition into sidelying L and then to sitting EOB with mod A. EOB her BP reduced to 157/ 62. Pt sat EOB with no support needed to maintain static sitting balance- good improvement from Saturday's evaluation. Pt completed UB bathing with min A for thoroughness. She was able to don nightgown with mod A. She requested to return back to supine 2/2 fatigue and increased dizziness/nausea. Pt scooted toward [facility] with CGA- two great scoots with considerable success. Pt required mod A to transition back into supine- it is very painful for her to lay on her L shoulder 2/2 past shingles causing neuropathy. From supine pt rolled R with min A for brief change. Small incontinent BM present, daughter providing peri hygiene. Pt completed oral care with cueing required for scanning/head turn to the L. Throughout session pt requiring cueing for L attention but was able to reach midline with only min cueing. Pt was left supine with all needs met, positioned to promote midline  orientation with the help of her daughter. Bed alarm set.   Once back supine after activity it rose 163/65.   Therapy Documentation Precautions:  Precautions Precautions: Fall Restrictions Weight Bearing Restrictions: No  Therapy/Group: Individual Therapy  LAURICE IGLESIA 11/12/2019, 6:52 AM

## 2019-11-12 NOTE — Progress Notes (Signed)
Inpatient Rehabilitation  Patient information reviewed and entered into eRehab system by Dayanis Bergquist M. Osmond Steckman, M.A., CCC/SLP, PPS Coordinator.  Information including medical coding, functional ability and quality indicators will be reviewed and updated through discharge.    

## 2019-11-12 NOTE — Progress Notes (Signed)
Patient Details  Name: Chelsea [name] MRN: [id number] Date of Birth: 1945/08/23  Today's Date: 11/12/2019  Hospital Problems: Active Problems:   Acute ischemic right posterior cerebral artery (PCA) stroke [facility])  Past Medical History:  Past Medical History:  Diagnosis Date  . Blind left eye   . Cardiac arrest (HCC)   . Diabetes mellitus without complication (HCC)   . Hypertension   . Stroke [facility])    Past Surgical History: History reviewed. No pertinent surgical history. Social History:  reports that she has been smoking. She has never used smokeless tobacco. She reports previous alcohol use. She reports that she does not use drugs.  Family / Support Systems Marital Status: Widow/Widower Patient Roles: Parent Children: [name]-daughter [phone]  Lisa-daughter [phone] Other Supports: Church members Anticipated Caregiver: Smith Robert and other fmaily members to assist also Ability/Limitations of Caregiver: Smith Robert has been providing mod/max level of assist prior to admission Caregiver Availability: 24/7 Family Dynamics: Close knit with children and other family members, pt has been declining for years. Once she had the CVA she had right leg issues and upper extremities with her nerve damage.  Social History Preferred language: English Religion: Baptist Cultural Background: No issues Education: HS Read: Yes Write: Yes Employment Status: Retired Marine scientist Issues: No issues Guardian/Conservator: None-according to MD pt is capable of making her own decisions while here. Pt does want her daughter-[name] who is her main caregiver, involved while here   Abuse/Neglect Abuse/Neglect Assessment Can Be Completed: Yes Physical Abuse: Denies Verbal Abuse: Denies Sexual Abuse: Denies Exploitation of patient/patient's resources: Denies Self-Neglect: Denies  Emotional Status Pt's affect, behavior and adjustment status: Pt has been nauseous since her CVA  and has a mulitidude of other health issues. She required 24 hr care prior to admission from her daughter. She tries to be positive but this is difficult with her issues. Recent Psychosocial Issues: other health issues-nerve damage with left shoulder and right hemiplegia of her leg from past CVA. Now with her brain stem CVA she is nauseous all of the time Psychiatric History: No history but would benefit from seeing neuro-psych while here for coping due to her multiple health issues Substance Abuse History: No issues  Patient / Family Perceptions, Expectations & Goals Pt/Family understanding of illness & functional limitations: Pt and her daughter's who are present in her room are able to explain her issues and have spoken with the MD and feel they have a good understanding of her treatment plan going forward.  Debbora Dus plans to be here daily to provide support to Mom Premorbid pt/family roles/activities: Mom, grandmother, retiree, church member, aunt, etc Anticipated changes in roles/activities/participation: resume Pt/family expectations/goals: Pt states: " I wish the doctor could stop my nausea and help me sleep it feels like I havn't slept for weeks."  [name] states: " I hope she can feel better she has to be miserable."  [employer]: None Premorbid Home Care/DME Agencies: Other (Comment) (has lift chair, adjustable bed, rw, tub seat, wheelchair) Transportation available at discharge: Family members Resource referrals recommended: Neuropsychology  Discharge Planning Living Arrangements: Children Support Systems: Children, Other relatives, Church/faith community Type of Residence: Private residence Insurance Resources: Media planner (specify) ([employer]) Financial Resources: Social Security, Family Support Financial Screen Referred: No Living Expenses: Own Money Management: Family Does the patient have any problems obtaining your medications?:  No Home Management: Daughter's do the home management Patient/Family Preliminary Plans: Return home with daughter's assisting with her care like they were  prior to admission. Smith Robert was providing 24/7 physical care prior to admission due to other health issues. Sabreena plans to be here daily and be involved in her care. Care Coordinator Barriers to Discharge: Home environment access/layout, Other (comments) Care Coordinator Barriers to Discharge Comments: Aware needs a ramp at DC. Pt's ability to participate in therapies due to her nausea Care Coordinator Anticipated Follow Up Needs: HH/OP  Clinical Impression Distressed pt who is nauseous and not able to get comfortable in bed. Two daughter's are present and very supportive and involved. Smith Robert has been providing 24/7 care prior to admission and plans to continue this. Hopefully MD can assist with her nausea and pt will be able to participate in therapies and power through. They are aware she will need a ramp upon discharge due to four steps into home. Will ask neuro-psych to see while here for coping due to pt dealing with a lot of medical issues.  Lucy Chris 11/12/2019, 10:14 AM

## 2019-11-12 NOTE — Progress Notes (Signed)
Inpatient Rehabilitation Center Individual Statement of Services  Patient Name:  Chelsea [name]  Date:  11/12/2019  Welcome to the Inpatient Rehabilitation Center.  Our goal is to provide you with an individualized program based on your diagnosis and situation, designed to meet your specific needs.  With this comprehensive rehabilitation program, you will be expected to participate in at least 3 hours of rehabilitation therapies Monday-Friday, with modified therapy programming on the weekends.  Your rehabilitation program will include the following services:  Physical Therapy (PT), Occupational Therapy (OT), Speech Therapy (ST), 24 hour per day rehabilitation nursing, Neuropsychology, Care Coordinator, Rehabilitation Medicine, Nutrition Services and Pharmacy Services  Weekly team conferences will be held on Wedneday to discuss your progress.  Your Inpatient Rehabilitation Care Coordinator will talk with you frequently to get your input and to update you on team discussions.  Team conferences with you and your family in attendance may also be held.  Expected length of stay: 21-28 days  Overall anticipated outcome: min assist overall level  Depending on your progress and recovery, your program may change. Your Inpatient Rehabilitation Care Coordinator will coordinate services and will keep you informed of any changes. Your Inpatient Rehabilitation Care Coordinator's name and contact numbers are listed  below.  The following services may also be recommended but are not provided by the Inpatient Rehabilitation Center:    Home Health Rehabiltiation Services  Outpatient Rehabilitation Services    Arrangements will be made to provide these services after discharge if needed.  Arrangements include referral to agencies that provide these services.  Your insurance has been verified to be:  [employer]vantage Your primary doctor is:  Adelene Amas  Pertinent information will be shared with your  doctor and your insurance company.  Inpatient Rehabilitation Care Coordinator:  Lavera Guise, [place] [phone] or [phone]  Information discussed with and copy given to patient by: Lucy Chris, 11/12/2019, 9:57 AM

## 2019-11-12 NOTE — Plan of Care (Signed)
  Problem: Consults Goal: RH STROKE PATIENT EDUCATION Description: See Patient Education module for education specifics  Outcome: Progressing Goal: Nutrition Consult-if indicated Outcome: Progressing Goal: Diabetes Guidelines if Diabetic/Glucose > 140 Description: If diabetic or lab glucose is > 140 mg/dl - Initiate Diabetes/Hyperglycemia Guidelines & Document Interventions  Outcome: Progressing   Problem: RH BOWEL ELIMINATION Goal: RH STG MANAGE BOWEL WITH ASSISTANCE Description: STG Manage Bowel with mod I Assistance. Outcome: Progressing   Problem: RH BLADDER ELIMINATION Goal: RH STG MANAGE BLADDER WITH ASSISTANCE Description: STG Manage Bladder With min Assistance Outcome: Progressing   Problem: RH SKIN INTEGRITY Goal: RH STG MAINTAIN SKIN INTEGRITY WITH ASSISTANCE Description: STG Maintain Skin Integrity With mod I Assistance. Outcome: Progressing   Problem: RH SAFETY Goal: RH STG ADHERE TO SAFETY PRECAUTIONS W/ASSISTANCE/DEVICE Description: STG Adhere to Safety Precautions With mod I Assistance/Device. Outcome: Progressing   Problem: RH PAIN MANAGEMENT Goal: RH STG PAIN MANAGED AT OR BELOW PT'S PAIN GOAL Description: Pain level less than 3 on scale of 0-10 Outcome: Progressing   Problem: RH KNOWLEDGE DEFICIT Goal: RH STG INCREASE KNOWLEDGE OF DIABETES Description: Pt will be able to demonstrate understanding of medication regimen, dietary and lifestyle modifications to better control blood glucose levels using handouts and booklet provided with mod I assist.  Outcome: Progressing Goal: RH STG INCREASE KNOWLEDGE OF HYPERTENSION Description: Pt will be able to demonstrate understanding of medication regimen, dietary and lifestyle modifications to better control blood pressure and prevent stroke using handouts and booklet provided with mod I assist.  Outcome: Progressing Goal: RH STG INCREASE KNOWLEGDE OF HYPERLIPIDEMIA Description: Pt will be able to demonstrate  understanding of medication regimen, dietary and lifestyle modifications to better control cholesterol levels using handouts and booklet provided with mod I assist.  Outcome: Progressing Goal: RH STG INCREASE KNOWLEDGE OF STROKE PROPHYLAXIS Description: Pt will be able to demonstrate understanding of medication regimen, dietary and lifestyle modifications to better control blood pressure and prevent stroke using handouts and booklet provided with mod I assist.  Outcome: Progressing

## 2019-11-12 NOTE — Progress Notes (Signed)
[place] PHYSICAL MEDICINE & REHABILITATION PROGRESS NOTE   Subjective/Complaints:  2 daughters at bedside with questions regarding CVA.  Pt c/o nausea but no vomiting , ate a small breakfast this am  One therapist suggested eye patch according to daughter Pt awake but then doses off  ROS: Patient denies  SOB,negative abd pain, +bowel incont  Objective:   No results found. Recent Labs    11/10/19 0500  WBC 11.0*  HGB 12.6  HCT 37.4  PLT 301   Recent Labs    11/10/19 0500  CREATININE 1.10*    Intake/Output Summary (Last 24 hours) at 11/12/2019 0914 Last data filed at 11/12/2019 0500 Gross per 24 hour  Intake --  Output 1050 ml  Net -1050 ml        Physical Exam: Vital Signs Blood pressure (!) 157/77, pulse (!) 55, temperature 97.8 F (36.6 C), resp. rate 18, weight 63.7 kg, SpO2 96 %. Constitutional: appears more comfortable today. Vital signs reviewed. HEENT: EOMI, oral membranes moist Neck: supple Cardiovascular: RRR without murmur. No JVD    Respiratory/Chest: CTA Bilaterally without wheezes or rales. Normal effort    GI/Abdomen: BS +, non-tender, non-distended Ext: no clubbing, cyanosis, or edema Psych: flat but much more engaging Neuro: eyes opened. Follows commands. Initiates conversation and demonstrates fair insight and awareness. Poor visual acuity/depth perception when eyes are open. Moved all 4 limbs 4- strength Senses LT in all 4's.  Musculoskeletal:     Assessment/Plan: 1. Functional deficits secondary to right PCA infarct which require 3+ hours per day of interdisciplinary therapy in a comprehensive inpatient rehab setting.  Physiatrist is providing close team supervision and 24 hour management of active medical problems listed below.  Physiatrist and rehab team continue to assess barriers to discharge/monitor patient progress toward functional and medical goals  Care Tool:  Bathing    Body parts bathed by patient: Chest, Abdomen, Face    Body parts bathed by helper: Right arm, Left arm, Front perineal area, Right upper leg, Buttocks, Left upper leg, Right lower leg, Left lower leg     Bathing assist Assist Level: 2 Helpers     Upper Body Dressing/Undressing Upper body dressing        Upper body assist Assist Level: Total Assistance - Patient < 25%    Lower Body Dressing/Undressing Lower body dressing      What is the patient wearing?: Incontinence brief     Lower body assist Assist for lower body dressing: Moderate Assistance - Patient 50 - 74%     Toileting Toileting    Toileting assist Assist for toileting: 2 Helpers     Transfers Chair/bed transfer  Transfers assist  Chair/bed transfer activity did not occur: Safety/medical concerns  Chair/bed transfer assist level: Maximal Assistance - Patient 25 - 49%     Locomotion Ambulation   Ambulation assist   Ambulation activity did not occur: Safety/medical concerns          Walk 10 feet activity   Assist  Walk 10 feet activity did not occur: Safety/medical concerns        Walk 50 feet activity   Assist Walk 50 feet with 2 turns activity did not occur: Safety/medical concerns         Walk 150 feet activity   Assist Walk 150 feet activity did not occur: Safety/medical concerns         Walk 10 feet on uneven surface  activity   Assist Walk 10 feet on uneven surfaces  activity did not occur: Safety/medical concerns         Wheelchair     Assist Will patient use wheelchair at discharge?: Yes Type of Wheelchair: Manual Wheelchair activity did not occur: Safety/medical concerns (pt unable to participate in [facility] assessment this date 2/2 severe nausea and vomiting and unable to transfer OOB at this time.)         Wheelchair 50 feet with 2 turns activity    Assist    Wheelchair 50 feet with 2 turns activity did not occur: Safety/medical concerns       Wheelchair 150 feet activity     Assist   Wheelchair 150 feet activity did not occur: Safety/medical concerns       Blood pressure (!) 157/77, pulse (!) 55, temperature 97.8 F (36.6 C), resp. rate 18, weight 63.7 kg, SpO2 96 %.  Medical Problem List and Plan: 1.  Decreased functional mobility with falls loss of vision left peripheral field secondary to right PCA infarction in the setting of left vertebral artery occlusion along with severe left ICA stenosis with previously silent subacute left parietal infarct secondary to large vessel disease.             -patient may shower             -ELOS/Goals: 12-16 days/Min/Mod A            -Continue CIR therapies including PT, OT, and SLP  2.  Antithrombotics: -DVT/anticoagulation: Lovenox             -antiplatelet therapy: Aspirin 81 mg daily and Plavix 25 mg daily x3 months then Plavix alone 3. Pain Management: Lyrica 25 mg daily 4. Mood: Provide emotional support, family supportive             -antipsychotic agents: N/A 5. Neuropsych: This patient is capable of making decisions on her own behalf. 6. Skin/Wound Care: Routine skin checks 7. Fluids/Electrolytes/Nutrition: intake poor to inconsistent d/t nausea  -encouraged her to push po as much as she can. 8.  Hypertension.  Coreg 12.5 mg twice daily.  Monitor with increased mobility.    Vitals:   11/11/19 2021 11/12/19 0536  BP: (!) 130/55 (!) 157/77  Pulse: (!) 55 (!) 55  Resp: 19 18  Temp: 97.8 F (36.6 C) 97.8 F (36.6 C)  SpO2: 95% 96%  elevated systolic this am , monitor , HR on low side would not increase coreg  9.  Diabetes mellitus peripheral neuropathy.  Hemoglobin A1c 10.1.  SSI.  Patient on Glucophage 500 mg twice daily prior to admission.  Resume as needed             labile readings so far.   10/30 won't add glucophage given nausea levels currently   CBG (last 3)  Recent Labs    11/11/19 1656 11/11/19 2118 11/12/19 0620  GLUCAP 132* 147* 136*  well controlled 11/1 10.  Hyperlipidemia.  Lipitor 12.   Tobacco abuse.  Counseling 13.  Vestibular sx: added scopolamine patch with some improvement.   -scheduled compazine  -prn zofran  -acclimation, OOB  -11/1 spoke to pt/daughters.discussed favorable prognosis for improvement of vestibular sx    LOS: 3 days A FACE TO FACE EVALUATION WAS PERFORMED  Erick Colace 11/12/2019, 9:14 AM

## 2019-11-12 NOTE — Progress Notes (Signed)
Physical Therapy Session Note  Patient Details  Name: Chelsea [name] MRN: [id number] Date of Birth: [birth year]  Today's Date: 11/12/2019 PT Individual Time: 8242-3536; 1443-1540 PT Individual Time Calculation (min): 55 min and 30 min PT Missed Time: 45 min Missed Time Reason: patient illness (nausea/vomiting)  Short Term Goals: Week 1:  PT Short Term Goal 1 (Week 1): pt to tolerate OOB upright position for 2 hours PT Short Term Goal 2 (Week 1): pt to demonstrate supine<>sit mod A PT Short Term Goal 3 (Week 1): pt to demonstrate sitting balance CGA PT Short Term Goal 4 (Week 1): pt to demonstrate sit<>stand transfers with LRAD at min A PT Short Term Goal 5 (Week 1): initiate gait training  Skilled Therapeutic Interventions/Progress Updates:    Session 1: Pt received in R sidelying in bed, agreeable to PT session. Pt reports ongoing nausea that increases with mobility, premedicated prior to start of therapy session. Pt also reports improvement in double vision this date. No complaints of pain. Sidelying to sitting on EOB with mod A for trunk elevation, increased time needed to complete transfer. Pt requires min A for static sitting balance EOB with R lateral lean that increases with onset of fatigue. Squat pivot transfer to w/c to the L with mod A. Pt requires increased time and cues to complete transfer safely. Pt has increase in symptoms of nausea and dizziness while seated in w/c, requests to return to bed. Squat pivot transfer to the L back to bed with max A. Sit to supine mod A for BLE management. Pt left in L sidelying in bed with needs in reach, bed alarm in place, daughter present at end of session.  Session 2: Attempted to see patient for scheduled PM session. Pt reports she just had bout of emesis, nursing aware and provided pt with anti-nausea medication. Pt given 15 min to recover from bout of emesis before this therapist returned. Upon therapist return pt agreeable to attempt  participation in session. Supine to sitting EOB with mod A for trunk control with increased time needed due to nausea. Once seated EOB pt requires close SBA to min A for sitting balance with onset of R lateral lean with onset of fatigue. Seated alt UE reaching for targets outside BOS and across midline with cues to scan to find targets in L visual field. Pt then able to scoot towards [facility] with min A and cues for sequencing. Pt requests to lay back down due to ongoing nausea. Pt returned to supine with mod A for BLE management. Pt is setup A to perform oral hygiene while seated in bed. Pt left semi-reclined in bed with needs in reach, bed alarm in place, daughter present at end of session. Pt missed a total of 45 min of scheduled therapy session due to nausea limiting ability to functionally participate.  Therapy Documentation Precautions:  Precautions Precautions: Fall Restrictions Weight Bearing Restrictions: No   Therapy/Group: Individual Therapy   Peter Congo, PT, DPT  11/12/2019, 8:59 AM

## 2019-11-12 NOTE — IPOC Note (Signed)
Overall Plan of Care [facility]) Patient Details Name: Chelsea [name] MRN: [id number] DOB: 1945/12/22  Admitting Diagnosis: <principal problem not specified>  Hospital Problems: Active Problems:   Acute ischemic right posterior cerebral artery (PCA) stroke (HCC)     Functional Problem List: Nursing Bladder, Endurance, Medication Management, Motor, Nutrition, Perception, Safety, Sensory, Skin Integrity  PT Balance, Behavior, Endurance, Motor, Sensory, Safety, Perception, Pain, Skin Integrity, Other (comment) (visual)  OT Balance, Cognition, Endurance, Motor, Nutrition, Pain, Perception, Safety, Sensory, Vision  SLP Cognition  TR         Basic ADL's: OT Grooming, Bathing, Dressing, Toileting, Eating     Advanced  ADL's: OT       Transfers: PT Bed Mobility, Bed to Chair, Customer service manager, Tub/Shower     Locomotion: PT Ambulation, Psychologist, prison and probation services, Stairs     Additional Impairments: OT Fuctional Use of Upper Extremity  SLP Social Cognition   Memory, Attention  TR      Anticipated Outcomes Item Anticipated Outcome  Self Feeding MIN A  Swallowing      Basic self-care  MOD A bathing/dressing  Toileting  MAX A toileting; MOD A shower   Bathroom Transfers MIN A  Bowel/Bladder  minimal assist  Transfers  CGA  Locomotion  min A  Communication     Cognition  supervision  Pain  Pain less than or equal to 0  Safety/Judgment  min assist   Therapy Plan: PT Intensity: Minimum of 1-2 x/day ,45 to 90 minutes PT Frequency: 5 out of 7 days PT Duration Estimated Length of Stay: 3 weeks OT Intensity: Minimum of 1-2 x/day, 45 to 90 minutes OT Frequency: 5 out of 7 days OT Duration/Estimated Length of Stay: 3-4 weeks SLP Intensity: Minumum of 1-2 x/day, 30 to 90 minutes SLP Frequency: 3 to 5 out of 7 days SLP Duration/Estimated Length of Stay: 21-28 days   Due to the current state of emergency, patients may not be receiving their 3-hours of Medicare-mandated  therapy.   Team Interventions: Nursing Interventions Patient/Family Education, Bladder Management, Disease Management/Prevention, Medication Management, Discharge Planning, Cognitive Remediation/Compensation, Skin Care/Wound Management  PT interventions Ambulation/gait training, Cognitive remediation/compensation, Warden/ranger, Community reintegration, Discharge planning, Disease management/prevention, DME/adaptive equipment instruction, Functional electrical stimulation, Neuromuscular re-education, Patient/family education, Functional mobility training, Pain management, Psychosocial support, Skin care/wound management, Stair training, Splinting/orthotics, Therapeutic Activities, Therapeutic Exercise, UE/LE Coordination activities, UE/LE Strength taining/ROM, Wheelchair propulsion/positioning, Visual/perceptual remediation/compensation  OT Interventions Balance/vestibular training, Discharge planning, Pain management, Self Care/advanced ADL retraining, Therapeutic Activities, UE/LE Coordination activities, Visual/perceptual remediation/compensation, Therapeutic Exercise, Skin care/wound managment, Patient/family education, Functional mobility training, Disease mangement/prevention, Cognitive remediation/compensation, Community reintegration, Fish farm manager, Neuromuscular re-education, Psychosocial support, Splinting/orthotics, UE/LE Strength taining/ROM, Wheelchair propulsion/positioning  SLP Interventions Cognitive remediation/compensation, Financial trader, Internal/external aids, Patient/family education, Functional tasks  TR Interventions    SW/CM Interventions Discharge Planning, Psychosocial Support, Patient/Family Education   Barriers to Discharge MD  Medical stability and vestibular dysfunction, central   Nursing      PT Inaccessible home environment, Home environment access/layout, Incontinence, Medication compliance, Behavior pt has 4 steps to enter however  family plans to install a ramp  OT Incontinence    SLP Other (comments) previous level of function, very limited due to multiple medical issues  SW       Team Discharge Planning: Destination: PT-Home ,OT- Home , SLP-Home Projected Follow-up: PT-Home health PT, 24 hour supervision/assistance, OT-  Home health OT, SLP-24 hour supervision/assistance, Home Health SLP Projected Equipment Needs: PT-To be determined,  OT- Tub/shower bench, 3 in 1 bedside comode, To be determined, SLP-None recommended by SLP Equipment Details: PT- , OT-Drop arm commode Patient/family involved in discharge planning: PT- Family member/caregiver, Patient,  OT-Patient, Family member/caregiver, SLP-Patient, Family member/caregiver  MD ELOS: 18-21d Medical Rehab Prognosis:  Good Assessment:   74 year old right-handed female with history of hypertension, diabetes mellitus, left carotid stenosis 80% was scheduled for carotid enterectomy in late November with Dr. Arbie Cookey, tobacco abuse,  medical noncompliance.  Per chart review independent with assistive device prior to admission.  1 level home 4 steps to entry.  She lives with her children and assistance as needed.  Presented 11/07/2019 with intractable nausea and vomiting as well as loss of vision left peripheral field.  Noted blood pressure 216/79.  Patient did have a CT of the head 10/30/2019 after a fall showing no evidence of acute changes.  MRI showed acute versus subacute infarct in the right hyper campus without hemorrhage.  Mild chronic ischemic changes.  CT angiogram of the head and neck ill-defined hypodensity right medial temporal lobe progressed since recent CT of 10/30/2019 most consistent with subacute infarction.  Occlusion left vertebral artery at the origin 80% focal noncalcified stenosis proximal left ICA artery.  Admission chemistries unremarkable except glucose 345 BUN 26 urine culture multiple species troponin negative hemoglobin A1c 10.1.  Echocardiogram with  ejection fraction of 60 to 65% no wall motion abnormalities grade 1 diastolic dysfunction.  Maintained on aspirin and Plavix for CVA prophylaxis x3 months then Plavix alone.  Subcutaneous Lovenox for DVT prophylaxis.  Hospital course urinary retention urology consulted Foley catheter tube was placed 11/09/2019 with plans for a voiding trial as patient's mobility improved.  Tolerating a regular diet.  Therapy evaluations completed and patient was admitted for a comprehensive rehab program.   See Team Conference Notes for weekly updates to the plan of care * Now requiring 24/7 Rehab RN,MD, as well as CIR level PT, OT and SLP.  Treatment team will focus on ADLs and mobility with goals set at min/modA level

## 2019-11-13 ENCOUNTER — Inpatient Hospital Stay (HOSPITAL_COMMUNITY): Payer: PPO | Admitting: Physical Therapy

## 2019-11-13 ENCOUNTER — Inpatient Hospital Stay (HOSPITAL_COMMUNITY): Payer: PPO | Admitting: Occupational Therapy

## 2019-11-13 LAB — GLUCOSE, CAPILLARY
Glucose-Capillary: 155 mg/dL — ABNORMAL HIGH (ref 70–99)
Glucose-Capillary: 224 mg/dL — ABNORMAL HIGH (ref 70–99)
Glucose-Capillary: 199 mg/dL — ABNORMAL HIGH (ref 70–99)
Glucose-Capillary: 128 mg/dL — ABNORMAL HIGH (ref 70–99)
Glucose-Capillary: 168 mg/dL — ABNORMAL HIGH (ref 70–99)

## 2019-11-13 MED ORDER — PROMETHAZINE HCL 12.5 MG RE SUPP
12.5000 mg | Freq: Four times a day (QID) | RECTAL | Status: DC | PRN
  Filled 2019-11-13: qty 1

## 2019-11-13 MED ORDER — ONDANSETRON HCL 4 MG PO TABS
4.0000 mg | ORAL_TABLET | Freq: Two times a day (BID) | ORAL | Status: DC
  Administered 2019-11-13 – 2019-12-02 (×39): 4 mg via ORAL
  Filled 2019-11-13 (×40): qty 1

## 2019-11-13 MED ORDER — METFORMIN HCL 500 MG PO TABS
500.0000 mg | ORAL_TABLET | Freq: Every day | ORAL | Status: DC
  Administered 2019-11-13 – 2019-11-15 (×3): 500 mg via ORAL
  Filled 2019-11-13 (×3): qty 1

## 2019-11-13 MED ORDER — ONDANSETRON HCL 4 MG/2ML IJ SOLN
4.0000 mg | Freq: Two times a day (BID) | INTRAMUSCULAR | Status: DC
  Filled 2019-11-13 (×11): qty 2

## 2019-11-13 MED ORDER — PROMETHAZINE HCL 12.5 MG PO TABS
12.5000 mg | ORAL_TABLET | Freq: Four times a day (QID) | ORAL | Status: DC | PRN
  Administered 2019-11-13 – 2019-12-02 (×6): 12.5 mg via ORAL
  Filled 2019-11-13 (×9): qty 1

## 2019-11-13 NOTE — Progress Notes (Signed)
Physical Therapy Session Note  Patient Details  Name: Chelsea [name] MRN: [id number] Date of Birth: 1945-12-06  Today's Date: 11/13/2019 PT Individual Time: 0805-0900 and 1400-1440 PT Individual Time Calculation (min): 55 min and 40 min  Short Term Goals: Week 1:  PT Short Term Goal 1 (Week 1): pt to tolerate OOB upright position for 2 hours PT Short Term Goal 2 (Week 1): pt to demonstrate supine<>sit mod A PT Short Term Goal 3 (Week 1): pt to demonstrate sitting balance CGA PT Short Term Goal 4 (Week 1): pt to demonstrate sit<>stand transfers with LRAD at min A PT Short Term Goal 5 (Week 1): initiate gait training  Skilled Therapeutic Interventions/Progress Updates: Pt presented in bed with dgt [name] present agreeable to therapy. Pt denies pain at rest and indicates 9/10 level nausea with pt recently receiving anti-nausea meds. PTA provided education on progressing OOB tolerance and importance of participating in therapy. With significantly increased time pt performed rolling to L with minA and max cues to locate object and fix eyes on it for stabilization. Pt then transitioned to sitting with minA and was able to tolerate sitting EOB approx 10 min. Pt noted to have R lateral lean initially requiring minA then progressing to [place] with fatigue. Performed STS with RW and modA with PTA blocking R knee due to history of knee buckling. With verbal cues pt was able to achieve erect posture prior to taking lateral steps to [facility]. Pt took x 4 lateral steps to [facility] with modA and PTA providing assistance for RW management. Pt noted to have poor eccentric control when returning to sitting with pt indicating no significant increase in fatigue. Pt transferred back to supine via sidelying and PTA providing verbal cues for gaze stabilization. With transitional movement. Pt was able to scoot to [facility] via bridge and verbal cues only. Once positioned bed place din chair position with PTA provided edu to dgt and pt  regarding using chair position for increasing upright tolerance. Pt left in bed at end of session with bed alarm on, call bell within reach and needs met.   Tx2: Pt presented in bed sleeping but easily aroused. Pt required some motivation to participate in therapy due to nausea with pt beginning to dry heaving briefly at one point. Per pt and dgt received anti-nausea meds previously. Pt agreeable to attempt gaze stabilization activities with PTA providing HEP handout as noted below. Pt noted to be low in bed therefore PTA lowered bed and with verbal cues pt was able to boost self to [facility] with minA. Pt noted to reposition self to R bias, PTA inquired and pt indicated had become habitual after shingles due to L shoulder pain. Pt was able to reposition self supine with PTA elevating head. Once repositioned pt attempted to participate in gaze stabilization vertical and horizontal.Pt was able to perform x 10 horizontal requiring the same cues for maintaining eyes on card however was able to tolerate x 10 without significant increase in nausea per pt. Pt able to perform x 5 vertical slowly with mod cues for technique as pt would close eyes or move card from line of sight. Pt then with increase in nausea requiring break with eyes closed. Pt then requesting to terminate session due to fatigue and nausea. Pt left with bed alarm on, call bell within reach and needs met.     Access Code: [email or web address] URL: [email or web address] Date: 11/13/2019 Prepared by: Karlyne Greenspan Loetta Connelley  Exercises Seated Gaze Stabilization with Head Nod -  1 x daily - 7 x weekly - 3 sets - 10 reps Seated Gaze Stabilization with Head Rotation - 1 x daily - 7 x weekly - 3 sets - 10 reps      Therapy Documentation Precautions:  Precautions Precautions: Fall Restrictions Weight Bearing Restrictions: No General: PT Amount of Missed Time (min): 35 Minutes PT Missed Treatment Reason: Patient fatigue;Patient ill (Comment) Vital  Signs: Therapy Vitals Temp: 98.2 F (36.8 C) Pulse Rate: (!) 52 Resp: 18 BP: (!) 155/58 Patient Position (if appropriate): Lying Oxygen Therapy SpO2: 98 % O2 Device: Room Air   Therapy/Group: Individual Therapy  Ernesto Zukowski  Gracelee Stemmler, PTA  11/13/2019, 4:00 PM

## 2019-11-13 NOTE — Progress Notes (Signed)
[place] PHYSICAL MEDICINE & REHABILITATION PROGRESS NOTE   Subjective/Complaints:    ROS: Patient denies  SOB,negative abd pain, +bowel incont  Objective:   No results found. Recent Labs    11/12/19 1009  WBC 10.5  HGB 12.3  HCT 36.8  PLT 322   Recent Labs    11/12/19 1009  NA 135  K 4.2  CL 98  CO2 26  GLUCOSE 225*  BUN 19  CREATININE 1.11*  CALCIUM 8.9    Intake/Output Summary (Last 24 hours) at 11/13/2019 0851 Last data filed at 11/12/2019 1500 Gross per 24 hour  Intake --  Output 525 ml  Net -525 ml        Physical Exam: Vital Signs Blood pressure (!) 138/50, pulse (!) 52, temperature 97.8 F (36.6 C), temperature source Oral, resp. rate 16, weight 63.7 kg, SpO2 96 %. Constitutional: appears more comfortable today. Vital signs reviewed. HEENT: EOMI, oral membranes moist Neck: supple Cardiovascular: RRR without murmur. No JVD    Respiratory/Chest: CTA Bilaterally without wheezes or rales. Normal effort    GI/Abdomen: BS +, non-tender, non-distended Ext: no clubbing, cyanosis, or edema Psych: flat but much more engaging Neuro: eyes opened. Follows commands. Initiates conversation and demonstrates fair insight and awareness. Poor visual acuity/depth perception when eyes are open. Moved all 4 limbs 4- strength Senses LT in all 4's.  Musculoskeletal:     Assessment/Plan: 1. Functional deficits secondary to right PCA infarct which require 3+ hours per day of interdisciplinary therapy in a comprehensive inpatient rehab setting.  Physiatrist is providing close team supervision and 24 hour management of active medical problems listed below.  Physiatrist and rehab team continue to assess barriers to discharge/monitor patient progress toward functional and medical goals  Care Tool:  Bathing    Body parts bathed by patient: Chest, Abdomen, Face   Body parts bathed by helper: Right arm, Left arm, Front perineal area, Right upper leg, Buttocks, Left  upper leg, Right lower leg, Left lower leg     Bathing assist Assist Level: 2 Helpers     Upper Body Dressing/Undressing Upper body dressing        Upper body assist Assist Level: Total Assistance - Patient < 25%    Lower Body Dressing/Undressing Lower body dressing      What is the patient wearing?: Incontinence brief     Lower body assist Assist for lower body dressing: Moderate Assistance - Patient 50 - 74%     Toileting Toileting    Toileting assist Assist for toileting: 2 Helpers     Transfers Chair/bed transfer  Transfers assist  Chair/bed transfer activity did not occur: Safety/medical concerns  Chair/bed transfer assist level: Maximal Assistance - Patient 25 - 49%     Locomotion Ambulation   Ambulation assist   Ambulation activity did not occur: Safety/medical concerns          Walk 10 feet activity   Assist  Walk 10 feet activity did not occur: Safety/medical concerns        Walk 50 feet activity   Assist Walk 50 feet with 2 turns activity did not occur: Safety/medical concerns         Walk 150 feet activity   Assist Walk 150 feet activity did not occur: Safety/medical concerns         Walk 10 feet on uneven surface  activity   Assist Walk 10 feet on uneven surfaces activity did not occur: Safety/medical concerns  Wheelchair     Assist Will patient use wheelchair at discharge?: Yes Type of Wheelchair: Manual Wheelchair activity did not occur: Safety/medical concerns (pt unable to participate in [facility] assessment this date 2/2 severe nausea and vomiting and unable to transfer OOB at this time.)         Wheelchair 50 feet with 2 turns activity    Assist    Wheelchair 50 feet with 2 turns activity did not occur: Safety/medical concerns       Wheelchair 150 feet activity     Assist  Wheelchair 150 feet activity did not occur: Safety/medical concerns       Blood pressure (!) 138/50, pulse  (!) 52, temperature 97.8 F (36.6 C), temperature source Oral, resp. rate 16, weight 63.7 kg, SpO2 96 %.  Medical Problem List and Plan: 1.  Decreased functional mobility with falls loss of vision left peripheral field secondary to right PCA infarction in the setting of left vertebral artery occlusion along with severe left ICA stenosis with previously silent subacute left parietal infarct secondary to large vessel disease.             -patient may shower             -ELOS/Goals: 12-16 days/Min/Mod A            -Continue CIR therapies including PT, OT, and SLP  2.  Antithrombotics: -DVT/anticoagulation: Lovenox             -antiplatelet therapy: Aspirin 81 mg daily and Plavix 25 mg daily x3 months then Plavix alone 3. Pain Management: Lyrica 25 mg daily 4. Mood: Provide emotional support, family supportive             -antipsychotic agents: N/A 5. Neuropsych: This patient is capable of making decisions on her own behalf. 6. Skin/Wound Care: Routine skin checks 7. Fluids/Electrolytes/Nutrition: intake poor to inconsistent d/t nausea  -encouraged her to push po as much as she can. 8.  Hypertension.  Coreg 12.5 mg twice daily.  Monitor with increased mobility.    Vitals:   11/12/19 1955 11/13/19 0545  BP: (!) 142/53 (!) 138/50  Pulse: (!) 52 (!) 52  Resp: 18 16  Temp: 98.1 F (36.7 C) 97.8 F (36.6 C)  SpO2: 99% 96%  generally normalizing , monitor , HR on low side would not increase coreg  9.  Diabetes mellitus peripheral neuropathy.  Hemoglobin A1c 10.1.  SSI.  Patient on Glucophage 500 mg twice daily prior to admission.  Resume as needed             CBG (last 3)  Recent Labs    11/12/19 1636 11/12/19 2054 11/13/19 0542  GLUCAP 193* 180* 155*  pm elevation , no nausea, start am metformin 10.  Hyperlipidemia.  Lipitor 12.  Tobacco abuse.  Counseling 13.  Vestibular sx: added scopolamine patch with some improvement.   -scheduled zofran   -acclimation, OOB  -11/2 spoke to  pt/daughter.discussed favorable prognosis for improvement of vestibular sx but that it may take several weeks    LOS: 4 days A FACE TO FACE EVALUATION WAS PERFORMED  Erick Colace 11/13/2019, 8:51 AM

## 2019-11-13 NOTE — Progress Notes (Signed)
Occupational Therapy Session Note  Patient Details  Name: Chelsea [name] MRN: [id number] Date of Birth: 06-05-1945  Today's Date: 11/13/2019 OT Individual Time: 1470-9295 OT Individual Time Calculation (min): 46 min    Short Term Goals: Week 1:  OT Short Term Goal 1 (Week 1): Pt will sit EOB/EOM for 5 min with MOD A for sitting balance OT Short Term Goal 2 (Week 1): Pt will transfer to w/c in prep for BSC/toilet transfer wiht MAX A of 1 OT Short Term Goal 3 (Week 1): Pt will complete 1/4 steps of UB dressing OT Short Term Goal 4 (Week 1): Pt will groom seated at sink with MOD A  Skilled Therapeutic Interventions/Progress Updates:  Patient met lying supine in bed in agreement with OT treatment session. Patient reporting nausea after meds given by RN. Cold compress applied to neck for management of nausea. Daughter present at bedside. Despite nausea, patient in agreement with bathing task at bed level. Patient able to wash chest, arms, and abdomen in chair position in bed. Min to Mod A for transfer to EOB in prep for UB dressing. Patient able to down gown seated EOB with Min to Mod A to maintain sitting balance. Squat-pivot transfer EOB <> wc with Max A and cues for hand/foot placement 2/2 generalized weakness and R lateral lean. With transition to wc, patient c/o increased nausea but in agreement with completion of grooming tasks seated at sink level prior to returning to bed. Wc mobility from EOB <> sink for completion of 1/3 grooming tasks. Education on safety with locking/unlocking wc breaks with patient able to return demonstrate. Return to supine with Mod A. Session concluded with bed in chair position, patient in semi side-lying with call bell within reach, bed alarm activated, and all needs met.   Therapy Documentation Precautions:  Precautions Precautions: Fall Restrictions Weight Bearing Restrictions: No General: General OT Amount of Missed Time: 29 Minutes  Therapy/Group:  Individual Therapy  Ezma Rehm R Howerton-Davis 11/13/2019, 10:23 AM

## 2019-11-14 ENCOUNTER — Inpatient Hospital Stay (HOSPITAL_COMMUNITY): Payer: PPO | Admitting: Physical Therapy

## 2019-11-14 ENCOUNTER — Inpatient Hospital Stay (HOSPITAL_COMMUNITY): Payer: PPO | Admitting: Speech Pathology

## 2019-11-14 ENCOUNTER — Inpatient Hospital Stay (HOSPITAL_COMMUNITY): Payer: PPO

## 2019-11-14 LAB — GLUCOSE, CAPILLARY
Glucose-Capillary: 133 mg/dL — ABNORMAL HIGH (ref 70–99)
Glucose-Capillary: 251 mg/dL — ABNORMAL HIGH (ref 70–99)
Glucose-Capillary: 156 mg/dL — ABNORMAL HIGH (ref 70–99)
Glucose-Capillary: 234 mg/dL — ABNORMAL HIGH (ref 70–99)

## 2019-11-14 MED ORDER — PREGABALIN 50 MG PO CAPS
50.0000 mg | ORAL_CAPSULE | Freq: Every evening | ORAL | Status: DC
  Administered 2019-11-14 – 2019-11-17 (×4): 50 mg via ORAL
  Filled 2019-11-14 (×4): qty 1

## 2019-11-14 MED ORDER — ARTIFICIAL TEARS OPHTHALMIC OINT
TOPICAL_OINTMENT | Freq: Every evening | OPHTHALMIC | Status: DC | PRN
  Filled 2019-11-14: qty 3.5

## 2019-11-14 MED ORDER — HYPROMELLOSE (GONIOSCOPIC) 2.5 % OP SOLN
1.0000 [drp] | Freq: Three times a day (TID) | OPHTHALMIC | Status: DC
  Administered 2019-11-14 – 2019-12-02 (×55): 1 [drp] via OPHTHALMIC
  Filled 2019-11-14: qty 15

## 2019-11-14 NOTE — Progress Notes (Signed)
Physical Therapy Session Note  Patient Details  Name: Chelsea [name] MRN: [id number] Date of Birth: [birth year]  Today's Date: 11/14/2019 PT Individual Time: 1125-1205 PT Individual Time Calculation (min): 40 min   Short Term Goals: Week 1:  PT Short Term Goal 1 (Week 1): pt to tolerate OOB upright position for 2 hours PT Short Term Goal 2 (Week 1): pt to demonstrate supine<>sit mod A PT Short Term Goal 3 (Week 1): pt to demonstrate sitting balance CGA PT Short Term Goal 4 (Week 1): pt to demonstrate sit<>stand transfers with LRAD at min A PT Short Term Goal 5 (Week 1): initiate gait training  Skilled Therapeutic Interventions/Progress Updates: Pt presented in bed sleeping with dgt Lattie Haw present and agreeable to therapy. Discussed with Lattie Haw pt's PLOF for clarification. Per pt and dgt until approx 3 weeks ago pt was overall supervision in home and able to perform short distance ambulation with RW with family aware of R knee buckling. As pt's functional status declined pt required increased assistance up to maxA for all functional mobility. Information pass on to treatment team to clarify questions. Pt then performed supine to sit with minA and use of bed features with verbal cues. Pt initially requiring minA for sitting balance due to R lateral lean however was able to progress with CGA. Pt indicated nausea/dizziness 6/10. Pt then performed STS with RW and modA with significant R lateral lean noted. PTA providing mod multimodal cues to correct and was able to maintain briefly then pt placed R knee into flexion and maxA needed from PTA to maintain standing. PTA returned pt to sitting maxA at EOB and performed second stand. Pt again able to stand with modA and was able to take initial step to R with RLE then when cued to step with LLE took step to LEFT with LLE with PTA providing maxA for standing balance and assisted pt to EOB. Pt then performed squat pivot to w/c to R with modA and was able to  scoot/reposition in w/c. Pt agreeable to stay in w/c and attempt to eat some lunch. PTA obtained milk per pt request and upon return pt presented with lunch tray. Pt then with increased nausea when smelling food and tray removed. PTA discussed with nsg obtaining something more bland for pt's tolerance. Pt left in w/c with belt alarm on, call bell within reach and needs met.      Therapy Documentation Precautions:  Precautions Precautions: Fall Restrictions Weight Bearing Restrictions: No General: PT Amount of Missed Time (min): 36 Minutes PT Missed Treatment Reason: Patient fatigue;Patient ill (Comment) Vital Signs: Therapy Vitals Temp: 98.3 F (36.8 C) Temp Source: Oral Pulse Rate: (!) 52 Resp: 18 BP: (!) 173/50 Patient Position (if appropriate): Sitting Oxygen Therapy SpO2: 100 % O2 Device: Room Air   Therapy/Group: Individual Therapy  Janeth Terry  Suleika Donavan, PTA  11/14/2019, 4:01 PM

## 2019-11-14 NOTE — Progress Notes (Addendum)
[facility] PHYSICAL MEDICINE & REHABILITATION PROGRESS NOTE   Subjective/Complaints:  Daughters reportedly provided assist at home +constipation, RN planning Miralax  ROS: Patient denies  SOB,negative abd pain, +bowel incont  Objective:   No results found. Recent Labs    11/12/19 1009  WBC 10.5  HGB 12.3  HCT 36.8  PLT 322   Recent Labs    11/12/19 1009  NA 135  K 4.2  CL 98  CO2 26  GLUCOSE 225*  BUN 19  CREATININE 1.11*  CALCIUM 8.9    Intake/Output Summary (Last 24 hours) at 11/14/2019 1007 Last data filed at 11/14/2019 1914 Gross per 24 hour  Intake 920 ml  Output 1900 ml  Net -980 ml        Physical Exam: Vital Signs Blood pressure (!) 151/58, pulse (!) 54, temperature 98.5 F (36.9 C), resp. rate 16, height _0  (1.702 m), weight 63.7 kg, SpO2 98 %.  General: No acute distress Mood and affect are appropriate Heart: Regular rate and rhythm no rubs murmurs or extra sounds Lungs: Clear to auscultation, breathing unlabored, no rales or wheezes Abdomen: Positive bowel sounds, soft nontender to palpation, nondistended Extremities: No clubbing, cyanosis, or edema Skin: No evidence of breakdown, no evidence of rash   Neuro: eyes opened. Follows commands. Initiates conversation and demonstrates fair insight and awareness. Poor visual acuity/depth perception when eyes are open. 4/5 RUE, LUE, LLE, RLE  Senses LT in all 4's.  Musculoskeletal:mild shoulder pain mainly at night    Assessment/Plan: 1. Functional deficits secondary to right PCA infarct which require 3+ hours per day of interdisciplinary therapy in a comprehensive inpatient rehab setting.  Physiatrist is providing close team supervision and 24 hour management of active medical problems listed below.  Physiatrist and rehab team continue to assess barriers to discharge/monitor patient progress toward functional and medical goals  Care Tool:  Bathing    Body parts bathed by patient: Chest,  Abdomen, Face   Body parts bathed by helper: Right arm, Left arm, Front perineal area, Right upper leg, Buttocks, Left upper leg, Right lower leg, Left lower leg     Bathing assist Assist Level: 2 Helpers     Upper Body Dressing/Undressing Upper body dressing        Upper body assist Assist Level: Total Assistance - Patient < 25%    Lower Body Dressing/Undressing Lower body dressing      What is the patient wearing?: Incontinence brief     Lower body assist Assist for lower body dressing: Moderate Assistance - Patient 50 - 74%     Toileting Toileting    Toileting assist Assist for toileting: 2 Helpers     Transfers Chair/bed transfer  Transfers assist  Chair/bed transfer activity did not occur: Safety/medical concerns  Chair/bed transfer assist level: Maximal Assistance - Patient 25 - 49%     Locomotion Ambulation   Ambulation assist   Ambulation activity did not occur: Safety/medical concerns          Walk 10 feet activity   Assist  Walk 10 feet activity did not occur: Safety/medical concerns        Walk 50 feet activity   Assist Walk 50 feet with 2 turns activity did not occur: Safety/medical concerns         Walk 150 feet activity   Assist Walk 150 feet activity did not occur: Safety/medical concerns         Walk 10 feet on uneven surface  activity  Assist Walk 10 feet on uneven surfaces activity did not occur: Safety/medical concerns         Wheelchair     Assist Will patient use wheelchair at discharge?: Yes Type of Wheelchair: Manual Wheelchair activity did not occur: Safety/medical concerns (pt unable to participate in [facility] assessment this date 2/2 severe nausea and vomiting and unable to transfer OOB at this time.)         Wheelchair 50 feet with 2 turns activity    Assist    Wheelchair 50 feet with 2 turns activity did not occur: Safety/medical concerns       Wheelchair 150 feet activity      Assist  Wheelchair 150 feet activity did not occur: Safety/medical concerns       Blood pressure (!) 151/58, pulse (!) 54, temperature 98.5 F (36.9 C), resp. rate 16, height _0  (1.702 m), weight 63.7 kg, SpO2 98 %.  Medical Problem List and Plan: 1.  Decreased functional mobility with falls loss of vision left peripheral field secondary to right PCA infarction in the setting of left vertebral artery occlusion along with severe left ICA stenosis with previously silent subacute left parietal infarct secondary to large vessel disease.             -patient may shower             -ELOS/Goals: 12-16 days/Min/Mod A            -Team conference today please see physician documentation under team conference tab, met with team  to discuss problems,progress, and goals. Formulized individual treatment plan based on medical history, underlying problem and comorbidities.  2.  Antithrombotics: -DVT/anticoagulation: Lovenox             -antiplatelet therapy: Aspirin 81 mg daily and Plavix 25 mg daily x3 months then Plavix alone 3. Pain Management: Lyrica 25 mg daily, Hx post herpetic neuralgia, allergy to lidocaine, increase lyrica to 63m 4. Mood: Provide emotional support, family supportive             -antipsychotic agents: N/A 5. Neuropsych: This patient is capable of making decisions on her own behalf. 6. Skin/Wound Care: Routine skin checks 7. Fluids/Electrolytes/Nutrition: intake poor to inconsistent d/t nausea  -encouraged her to push po as much as she can. 8.  Hypertension.  Coreg 12.5 mg twice daily.  Monitor with increased mobility.    Vitals:   11/13/19 1955 11/14/19 0453  BP: (!) 130/47 (!) 151/58  Pulse: (!) 51 (!) 54  Resp: 16 16  Temp: 98.3 F (36.8 C) 98.5 F (36.9 C)  SpO2: 96% 98%  generally normalizing , monitor , HR on low side would not increase coreg  9.  Diabetes mellitus peripheral neuropathy.  Hemoglobin A1c 10.1.  SSI.  Patient on Glucophage 500 mg twice  daily prior to admission.  Resume as needed             CBG (last 3)  Recent Labs    11/13/19 1620 11/13/19 2056 11/14/19 0533  GLUCAP 128* 168* 133*  pm elevation ,start am metformin 10.  Hyperlipidemia.  Lipitor 12.  Tobacco abuse.  Counseling 13.  Vestibular sx: added scopolamine patch with some improvement. Still limiting PT participation  -scheduled zofran   -acclimation, OOB  -11/2 spoke to pt/daughter.discussed favorable prognosis for improvement of vestibular sx but that it may take several weeks    LOS: 5 days A FACE TO FACE EVALUATION WAS PERFORMED  ACharlett Blake11/03/2019, 10:07  AM

## 2019-11-14 NOTE — Progress Notes (Signed)
Patient ID: Chelsea [name], female   DOB: 03/18/1945, 74 y.o.   MRN: [id number] Team Conference Report to Patient/Family  Team Conference discussion was reviewed with the patient and caregiver, including goals, any changes in plan of care and target discharge date.  Patient and caregiver express understanding and are in agreement.  The patient has a target discharge date of  (reconference next week).  Andria Rhein 11/14/2019, 1:47 PM

## 2019-11-14 NOTE — Progress Notes (Signed)
Nutrition Follow-up  DOCUMENTATION CODES:   Not applicable  INTERVENTION:   - Please obtain updated weight  - Continue Boost Breeze po TID, each supplement provides 250 kcal and 9 grams of protein  - Continue MVI with minerals daily  - Encourage adequate PO intake  - d/c ProSource Plus due to pt preference  NUTRITION DIAGNOSIS:   Inadequate oral intake related to nausea, vomiting as evidenced by meal completion < 50%.  Progressing  GOAL:   Patient will meet greater than or equal to 90% of their needs  Progressing  MONITOR:   PO intake, Supplement acceptance, Labs, Weight trends, Skin, I & O's  REASON FOR ASSESSMENT:   Malnutrition Screening Tool    ASSESSMENT:   Chelsea [name] is a 74 year old right-handed female with history of hypertension, diabetes mellitus, left carotid stenosis 80% was scheduled for carotid enterectomy in late November with Dr. Arbie Cookey, tobacco abuse,  medical noncompliance.  Per chart review independent with assistive device prior to admission.  Spoke with pt and daughter at bedside. Pt reports appetite is improving and pt's daughter agrees. However, pt now having some nausea but no emesis. RN in room providing nursing care. Pt taking sorbitol due to no BM since 10/31.  Pt ate ~95% of lunch meal today and had consumed 1 Boost Breeze. RN had just provided pt with a second Boost Breeze. Pt's daughter reports that pt tolerates these well and typically consumes at least half of each supplement.  Discussed importance of adequate PO intake in maintaining lean muscle mass and promoting healing.  No new weights available since 11/11/19.  Meal Completion: 0-100%  Medications reviewed and include: ProSource Plus BID, Boost Breeze TID, SSI, metformin, MVI with minerals, Zofran q 12 hours, scopolamine patch, PRN phenergan  Labs reviewed. CBG's: 152-251 x 24 hours  NUTRITION - FOCUSED PHYSICAL EXAM:    Most Recent Value  Orbital Region Mild  depletion  Upper Arm Region No depletion  Thoracic and Lumbar Region No depletion  Buccal Region No depletion  Temple Region Mild depletion  Clavicle Bone Region Mild depletion  Clavicle and Acromion Bone Region Mild depletion  Scapular Bone Region No depletion  Dorsal Hand Mild depletion  Patellar Region Mild depletion  Anterior Thigh Region Moderate depletion  Posterior Calf Region Moderate depletion  Edema (RD Assessment) None  Hair Reviewed  Eyes Reviewed  Mouth Reviewed  Skin Reviewed  Nails Reviewed       Diet Order:   Diet Order            Diet regular Room service appropriate? Yes; Fluid consistency: Thin  Diet effective now                 EDUCATION NEEDS:   No education needs have been identified at this time  Skin:  Skin Assessment: Reviewed RN Assessment  Last BM:  11/11/19  Height:   Ht Readings from Last 1 Encounters:  11/13/19 5\' 7"  (1.702 m)    Weight:   Wt Readings from Last 1 Encounters:  11/11/19 63.7 kg    Ideal Body Weight:  61.4 kg  BMI:  Body mass index is 21.99 kg/m.  Estimated Nutritional Needs:   Kcal:  1850-2050  Protein:  95-110 grams  Fluid:  > 1.8 L    11/13/19, MS, RD, LDN Inpatient Clinical Dietitian Please see [facility] for contact information.

## 2019-11-14 NOTE — Progress Notes (Signed)
Physical Therapy Session Note  Patient Details  Name: Chelsea [name] MRN: [id number] Date of Birth: 05/10/1945  Today's Date: 11/14/2019 PT Individual Time: 8677-3736 PT Individual Time Calculation (min): 38 min   Short Term Goals: Week 1:  PT Short Term Goal 1 (Week 1): pt to tolerate OOB upright position for 2 hours PT Short Term Goal 2 (Week 1): pt to demonstrate supine<>sit mod A PT Short Term Goal 3 (Week 1): pt to demonstrate sitting balance CGA PT Short Term Goal 4 (Week 1): pt to demonstrate sit<>stand transfers with LRAD at min A PT Short Term Goal 5 (Week 1): initiate gait training  Skilled Therapeutic Interventions/Progress Updates:   Pt received sitting in WC and requesting to return to bed due to fatigue and severe nausea 8/10. Stand pivot transfer to bed with min-mod assist with UE support on bed rail. Supine>sit with min assist and increased time as pt noted to be very guarded with transition into sidelying due to nausea. Once in supine, pt reports decreased nausea s/s to 5/10. PT instructed pt in BLE NMR for SLR, hip adduction/abduction, heel slides, lumbar rotation, ankle DF/PF, bridges; each completed 8-10 with cues for decreased speed and full ROM to improve neuromotor recruitment and coordination. Pt left supine in bed with call bell in reach and all needs met.           Therapy Documentation Precautions:  Precautions Precautions: Fall Restrictions Weight Bearing Restrictions: No General: PT Amount of Missed Time (min): 36 Minutes PT Missed Treatment Reason: Patient fatigue;Patient ill (Comment) Vital Signs: Therapy Vitals Temp: 98.3 F (36.8 C) Temp Source: Oral Pulse Rate: (!) 52 Resp: 18 BP: (!) 173/50 Patient Position (if appropriate): Sitting Oxygen Therapy SpO2: 100 % O2 Device: Room Air Pain:   denies    Therapy/Group: Individual Therapy  Lorie Phenix 11/14/2019, 3:00 PM

## 2019-11-14 NOTE — Progress Notes (Signed)
Speech Language Pathology Daily Session Note  Patient Details  Name: Chelsea [name] MRN: [id number] Date of Birth: [birth year]  Today's Date: 11/14/2019 SLP Individual Time: 0930-1000 SLP Individual Time Calculation (min): 30 min  Short Term Goals: Week 1: SLP Short Term Goal 1 (Week 1): Pt will utilize external aids to facilitate recall of daily information with min assist multimodal cues. SLP Short Term Goal 2 (Week 1): Pt will sustain her attention to basic, famiiar tasks for ~5 minute intervals with min assist verbal cues for redirection to task.  Skilled Therapeutic Interventions: Pt was seen for skilled ST targeting continued diagnostic treatment of cognition. SLP administered MOCA Blind, which pt scored 16/22, indicative of mild deficits in recall and abstract thinking. Pt's daughter present at bedside. Pt and daughter report that pt is at baseline level of cognitive-linguistic function now and have noticed some improvements/return to baseline over last couple of days. Pt also has help from daughter with all ADLs and consistent 24/7 supervision. She sustained her attention in 7-10 minute intervals with Supervision cues for redirection. Given that pt is at baseline level of functioning and has extensive care/assistance with all basic tasks and beyond, no further skilled ST is indicated.        Pain Pain Assessment Pain Scale: 0-10 Pain Score: 0-No pain  Therapy/Group: Individual Therapy  Little Ishikawa 11/14/2019, 10:09 AM

## 2019-11-14 NOTE — Progress Notes (Signed)
Occupational Therapy Session Note  Patient Details  Name: Chelsea [name] MRN: [id number] Date of Birth: 07-08-1945  Today's Date: 11/14/2019 OT Individual Time: 0800-0900 OT Individual Time Calculation (min): 60 min    Short Term Goals: Week 1:  OT Short Term Goal 1 (Week 1): Pt will sit EOB/EOM for 5 min with MOD A for sitting balance OT Short Term Goal 2 (Week 1): Pt will transfer to w/c in prep for BSC/toilet transfer wiht MAX A of 1 OT Short Term Goal 3 (Week 1): Pt will complete 1/4 steps of UB dressing OT Short Term Goal 4 (Week 1): Pt will groom seated at sink with MOD A  Skilled Therapeutic Interventions/Progress Updates:    Pt received supine with c/o pain in her L shoulder, unrated, but discussed with MD. Pt agreeable to EOB ADLs. Pt completed bed mobility to EOB with min A- big improvement! While EOB pt required frequent cueing and min-mod A to maintain midline orientation, with frequent R LOB. Pt completed sit > stand with the RW from EOB with mod A, requiring mod-max A to maintain standing balance d/t heavy R lean. Pt then completed sit > stand in the stedy with mod A, requiring max A to position correctly initially. Pt was then able to maintain midline orientation with only min A! Pt completed UB bathing with min A perched seated in stedy. Pt stood for total A peri hygiene and brief change. Pt returned to supine in bed with mod A. Pt was left supine with all needs met, daughter present, bed alarm set.   Therapy Documentation Precautions:  Precautions Precautions: Fall Restrictions Weight Bearing Restrictions: No   Therapy/Group: Individual Therapy  Chelsea [name] 11/14/2019, 10:33 AM

## 2019-11-14 NOTE — Patient Care Conference (Signed)
Inpatient RehabilitationTeam Conference and Plan of Care Update Date: 11/14/2019   Time: 10:05 AM    Patient Name: Chelsea [name]      Medical Record Number: [id number]  Date of Birth: Apr 19, 1945 Sex: Female         Room/Bed: 4W12C/4W12C-01 Payor Info: Payor: [employer]na [employer]AGE / Plan: [employer] PPO / Product Type: *No Product type* /    Admit Date/Time:  11/09/2019  7:29 PM  Primary Diagnosis:  <principal problem not specified>  Hospital Problems: Active Problems:   Acute ischemic right posterior cerebral artery (PCA) stroke [facility])    Expected Discharge Date: Expected Discharge Date:  (reconference next week)  Team Members Present: Physician leading conference: Dr. Claudette Laws Care Coodinator Present: Chana Bode, RN, BSN, CRRN;Christina Vita Barley, BSW Nurse Present: Other (comment) Roseanne Reno, RN) PT Present: Grier Rocher, PT OT Present: Jake Shark, OT SLP Present: Suzzette Righter, CF-SLP PPS Coordinator present : Edson Snowball, Park Breed, SLP     Current Status/Progress Goal Weekly Team Focus  Bowel/Bladder   Pt has foley for urinary retention. Continent/incontient of bowel. LBM-10/31  To promote continence for bowel and eventually get to a point where pt does not need foley.  Assess tolieting needs often, measure urine output from foley, do CHG's/foley care to prevent infections.   Swallow/Nutrition/ Hydration             ADL's   Min A UB bathing/dressing at bed level, Max A ADL transfers via squat-pivot, Max A LB ADLs. Limited participation 2/2 severe nausea.  Min to Mod A  Functional cognition, activity tolerance, ADL transfers, UB/LB bathing dressing progressing to sink level   Mobility   min A bed mobility with increased time, R lateral lean with sitting balance, modA STS, modA steps with RW with intermittent R knee instability, limited OOB tolerance due to nausea/dizziness  CGA transfers and bed mobility; min A gait; CGA WC mobility   OOB tolerance, transfers, gait, sitting balance, d/c planning   Communication             Safety/Cognition/ Behavioral Observations  Supervision-Min (at baseline per pt and daughter)  Supervision  D/C from ST 11/3   Pain   Pain in left shoulder, given prn tylenol.  To decrease pain level below 3/10.  Assess pain q shift or prn.   Skin   Skin is intact.  Prevent skin breakdown from occurring.  Assess skin q shift or prn.     Discharge Planning:  Discharging back home with daughters Smith Robert primary-previously assisted MOD-MAX) has lift chair, RW, Shower seat, WC and adjustable bed   Team Discussion: Progress impaired by nausea and dizziness and family doing more for the patient than needed. Labile BP with bradycardia and post herpetic neuralgia in right shoulder; MD ordered lidocaine patch for shoulder pain. Minor issues with dry eye and constipation addressed.  Patient on target to meet rehab goals: Yes; min-mod assist goals set  *See Care Plan and progress notes for long and short-term goals.   Revisions to Treatment Plan:  SLP noted baseline cognitive status and discharged from their service Focus on midline orientation, max cues for activities  Teaching Needs: Transfers, toileting, medications, etc.   Current Barriers to Discharge: Home enviroment access/layout (4 step entry); daughter working on a ramp Has good support and had several pieces of DME PTA  Possible Resolutions to Barriers:  Family education    Medical Summary Current Status: Neuropathic pain postherpetic neuralgia left shoulder, vestibular issues, dry eye related  to anticholinergic medications  Barriers to Discharge: Medical stability;Other (comments)  Barriers to Discharge Comments: Neuropathic pain secondary to postherpetic neuralgia, vestibular issues [place] to CVA Possible Resolutions to [employer] Focus: Medication management for pain as well as vestibular issues, treatment of dry  eye   Continued Need for Acute Rehabilitation Level of Care: The patient requires daily medical management by a physician with specialized training in physical medicine and rehabilitation for the following reasons: Direction of a multidisciplinary physical rehabilitation program to maximize functional independence : Yes Medical management of patient stability for increased activity during participation in an intensive rehabilitation regime.: Yes Analysis of laboratory values and/or radiology reports with any subsequent need for medication adjustment and/or medical intervention. : Yes   I attest that I was present, lead the team conference, and concur with the assessment and plan of the team.   Chana Bode B 11/14/2019, 1:08 PM

## 2019-11-14 NOTE — Plan of Care (Signed)
  Problem: Consults Goal: RH STROKE PATIENT EDUCATION Description: See Patient Education module for education specifics  Outcome: Progressing Goal: Nutrition Consult-if indicated Outcome: Progressing Goal: Diabetes Guidelines if Diabetic/Glucose > 140 Description: If diabetic or lab glucose is > 140 mg/dl - Initiate Diabetes/Hyperglycemia Guidelines & Document Interventions  Outcome: Progressing   Problem: RH BOWEL ELIMINATION Goal: RH STG MANAGE BOWEL WITH ASSISTANCE Description: STG Manage Bowel with mod I Assistance. Outcome: Progressing   Problem: RH BLADDER ELIMINATION Goal: RH STG MANAGE BLADDER WITH ASSISTANCE Description: STG Manage Bladder With min Assistance Outcome: Progressing   Problem: RH SKIN INTEGRITY Goal: RH STG MAINTAIN SKIN INTEGRITY WITH ASSISTANCE Description: STG Maintain Skin Integrity With mod I Assistance. Outcome: Progressing   Problem: RH SAFETY Goal: RH STG ADHERE TO SAFETY PRECAUTIONS W/ASSISTANCE/DEVICE Description: STG Adhere to Safety Precautions With mod I Assistance/Device. Outcome: Progressing   Problem: RH PAIN MANAGEMENT Goal: RH STG PAIN MANAGED AT OR BELOW PT'S PAIN GOAL Description: Pain level less than 3 on scale of 0-10 Outcome: Progressing   Problem: RH KNOWLEDGE DEFICIT Goal: RH STG INCREASE KNOWLEDGE OF DIABETES Description: Pt will be able to demonstrate understanding of medication regimen, dietary and lifestyle modifications to better control blood glucose levels using handouts and booklet provided with mod I assist.  Outcome: Progressing Goal: RH STG INCREASE KNOWLEDGE OF HYPERTENSION Description: Pt will be able to demonstrate understanding of medication regimen, dietary and lifestyle modifications to better control blood pressure and prevent stroke using handouts and booklet provided with mod I assist.  Outcome: Progressing Goal: RH STG INCREASE KNOWLEGDE OF HYPERLIPIDEMIA Description: Pt will be able to demonstrate  understanding of medication regimen, dietary and lifestyle modifications to better control cholesterol levels using handouts and booklet provided with mod I assist.  Outcome: Progressing Goal: RH STG INCREASE KNOWLEDGE OF STROKE PROPHYLAXIS Description: Pt will be able to demonstrate understanding of medication regimen, dietary and lifestyle modifications to better control blood pressure and prevent stroke using handouts and booklet provided with mod I assist.  Outcome: Progressing   

## 2019-11-14 NOTE — Progress Notes (Signed)
Speech Language Pathology Discharge Summary  Patient Details  Name: Chelsea [name] MRN: [id number] Date of Birth: Nov 15, 1945  Patient has met 2 of 2 long term goals.  Patient to discharge at overall Supervision;Min level.  Reasons goals not met: n/a   Clinical Impression/Discharge Summary:   Pt made functional gains and met 2 out of 2 long term goals this admission and is at baseline level of cognitive-linguistic functioning, per daughter and pt report. Pt currently requires Supervision-Min assist for basic tasks due to mild deficits in recall, problem solving and attention and will require 24/7 supervision at discharge. Pt has demonstrated improved ability to attend to functional tasks and recall new information. Given that she has 24/7 supervision and assistance with all basic tasks/ADLs (and beyond), and the fact that she is back to baseline, no further skilled ST is indicated at this time. Pt and daughter are in agreement with this plan/recommendation. Pt and family education is complete at this time.    Care Partner:  Caregiver Able to Provide Assistance: Yes  Type of Caregiver Assistance: Cognitive;Physical  Recommendation:  24 hour supervision/assistance;Home Health SLP  Rationale for SLP Follow Up: Other (comment) (none)   Equipment: none   Reasons for discharge: Treatment goals met;Other (comment) (pt at baseline function)   Patient/Family Agrees with Progress Made and Goals Achieved: Yes    Arbutus Leas 11/14/2019, 10:12 AM

## 2019-11-15 ENCOUNTER — Inpatient Hospital Stay (HOSPITAL_COMMUNITY): Payer: PPO | Admitting: *Deleted

## 2019-11-15 ENCOUNTER — Inpatient Hospital Stay (HOSPITAL_COMMUNITY): Payer: PPO

## 2019-11-15 ENCOUNTER — Inpatient Hospital Stay (HOSPITAL_COMMUNITY): Payer: PPO | Admitting: Physical Therapy

## 2019-11-15 ENCOUNTER — Inpatient Hospital Stay (HOSPITAL_COMMUNITY): Payer: PPO | Admitting: Occupational Therapy

## 2019-11-15 LAB — GLUCOSE, CAPILLARY
Glucose-Capillary: 152 mg/dL — ABNORMAL HIGH (ref 70–99)
Glucose-Capillary: 216 mg/dL — ABNORMAL HIGH (ref 70–99)
Glucose-Capillary: 136 mg/dL — ABNORMAL HIGH (ref 70–99)
Glucose-Capillary: 245 mg/dL — ABNORMAL HIGH (ref 70–99)

## 2019-11-15 MED ORDER — SORBITOL 70 % SOLN
15.0000 mL | Freq: Once | Status: AC
  Administered 2019-11-15: 15 mL via ORAL
  Filled 2019-11-15: qty 30

## 2019-11-15 MED ORDER — METFORMIN HCL 500 MG PO TABS
500.0000 mg | ORAL_TABLET | Freq: Two times a day (BID) | ORAL | Status: DC
  Administered 2019-11-15 – 2019-12-02 (×35): 500 mg via ORAL
  Filled 2019-11-15 (×35): qty 1

## 2019-11-15 NOTE — Evaluation (Signed)
Recreational Therapy Assessment and Plan  Patient Details  Name: Chelsea [name] MRN: [id number] Date of Birth: 11/17/1945 Today's Date: 11/15/2019  Rehab Potential:  Good ELOS:   2 weeks  Assessment Hospital Problem: Active Problems:   Acute ischemic right posterior cerebral artery (PCA) stroke (HCC)   Past Medical History:      Past Medical History:  Diagnosis Date   Blind left eye    Cardiac arrest ([place])    Diabetes mellitus without complication ([place])    Hypertension    Stroke [facility])    Past Surgical History: History reviewed. No pertinent surgical history.  Assessment & Plan Clinical Impression: Patient is a 74 y.o. year old female history of hypertension, diabetes mellitus, left carotid stenosis 80% was scheduled for carotid enterectomy in late November with Dr. Donnetta Hutching, tobacco abuse, medical noncompliance. Per chart review independent with assistive device prior to admission. 1 level home 4 steps to entry. She lives with her children and assistance as needed. Presented 11/07/2019 with intractable nausea and vomiting as well as loss of vision left peripheral field. Noted blood pressure 216/79. Patient did have a CT of the head 10/30/2019 after a fall showing no evidence of acute changes. MRI showed acute versus subacute infarct in the right hyper campus without hemorrhage. Mild chronic ischemic changes. CT angiogram of the head and neck ill-defined hypodensity right medial temporal lobe progressed since recent CT of 10/30/2019 most consistent with subacute infarction. Occlusion left vertebral artery at the origin 80% focal noncalcified stenosis proximal left ICA artery. Admission chemistries unremarkable except glucose 345 BUN 26 urine culture multiple species troponin negative hemoglobin A1c 10.1. Echocardiogram with ejection fraction of 60 to 65% no wall motion abnormalities grade 1 diastolic dysfunction. Maintained on aspirin and Plavix for CVA prophylaxis  x3 months then Plavix alone. Subcutaneous Lovenox for DVT prophylaxis. Hospital course urinary retention urology consulted Foley catheter tube was placed 11/09/2019 with plans for a voiding trial as patient's mobility improved. Tolerating a regular diet. Therapy evaluations completed and patient was admitted for a comprehensive rehab program. Please see preadmission and consult from earlier today as well.   Patient transferred to CIR on 11/09/2019.    Met with pt and daughter today to discuss TR services, leisure interests, activity analysis/modificaitons, and coping strategies.  Pt reports being very active crafting and cooking PTA. Pt presents with decreased activity tolerance, decreased functional mobility, decreased balance,  decreased coordination, decreased vision, left inattention Limiting pt's independence with leisure/community pursuits.  Recommendations for other services: None   Discharge Criteria: Patient will be discharged from TR if patient refuses treatment 3 consecutive times without medical reason.  If treatment goals not met, if there is a change in medical status, if patient makes no progress towards goals or if patient is discharged from hospital.  The above assessment, treatment plan, treatment alternatives and goals were discussed and mutually agreed upon: by patient  [place] 11/15/2019, 4:04 PM

## 2019-11-15 NOTE — Progress Notes (Signed)
Patient has been more verbally and physically engaged in participating while during her care. She is in better spirits and communicates during this shift. Verbalizes pain to left shoulder and  medicated x2 with Tylenol 650 mg po (2023 and 0528), tolerated well with no nausea this entire shift, Still consuming small amount of liquids at a time, but liquids are not causing nausea episodes. Repositioned and turned, skin care provided, foley patent and draining well. Daughter [name] remain at bedside, and participated as needed. Monitor and assisted, continue current medical regime. Call bell within reach, bed alarm on,and safety measures continue.

## 2019-11-15 NOTE — Progress Notes (Signed)
Physical Therapy Session Note  Patient Details  Name: Chelsea [name] MRN: [id number] Date of Birth: 05/24/1945  Today's Date: 11/15/2019 PT Individual Time: 0802-0905 AND 1530-1600 PT Individual Time Calculation (min): 63 min and 30   Short Term Goals: Week 1:  PT Short Term Goal 1 (Week 1): pt to tolerate OOB upright position for 2 hours PT Short Term Goal 2 (Week 1): pt to demonstrate supine<>sit mod A PT Short Term Goal 3 (Week 1): pt to demonstrate sitting balance CGA PT Short Term Goal 4 (Week 1): pt to demonstrate sit<>stand transfers with LRAD at min A PT Short Term Goal 5 (Week 1): initiate gait training  Skilled Therapeutic Interventions/Progress Updates:  Session 1  Pt received supine in bed and agreeable to PT. RN and MD present at start of session for pt assessment and to administer medications. Supine>sit transfer with min assist and cues for midline orientation to prevent lateral lean to the R.   Stand pivot transfer to WC with mod assist from PT and no AD. Pt noted to stand with severely flexed posture and pt unable to correct regardless of cues from PT.   Pt transported to rehab gym in WC. Gait training in parallel bars x 8 ft with min-mod assist and close WC follow. Pt no overt LOB throughout but nild R lateral lean noted. Pt then performed gait training in  bariatric RW for added stability x 20ft with mod assist to maintain neutral midline.   Dynamic standing balance to perform forward and lateral L reach to remove and place playing cards on velcro board x 6 to force L sided weight shift and improve visual scanning to the L.   Pt returned to room and performed stand pivot transfer to bed with mod A with RW and max cues for motor planning. Sit>supine completed with min assist on the RLE and left supine in bed with call bell in reach and all needs met.   Session 2  Pt received supine in bed and agreeable to PT. Supine>sit transfer with min assist due to increased R  lateral lean this PM. Pt performed ambulatory transfer to and from toilet with mod assist and moderate cues for midline and AD management.  Pt attempted to void bowels without success. PT assisted pt to don clean brief and house coats with mod assist and in RW. Pt returned to room and performed ambulatory transfer to bed with mod assist as listed above. Sit>supine completed with supevision assist, and left supine in bed with call bell in reach and all needs met.         Therapy Documentation Precautions:  Precautions Precautions: Fall Restrictions Weight Bearing Restrictions: No Vital Signs: Therapy Vitals Temp: 97.6 F (36.4 C) Temp Source: Oral Pulse Rate: (!) 52 Resp: 18 BP: (!) 165/56 Patient Position (if appropriate): Sitting Oxygen Therapy SpO2: (!) 88 % O2 Device: Room Air Pain: denies Therapy/Group: Individual Therapy   E  11/15/2019, 2:29 PM  

## 2019-11-15 NOTE — Progress Notes (Signed)
Physical Therapy Session Note  Patient Details  Name: Chelsea [name] MRN: [id number] Date of Birth: 02-Dec-1945  Today's Date: 11/15/2019 PT Individual Time: 1105-1200 PT Individual Time Calculation (min): 55 min   Short Term Goals: Week 1:  PT Short Term Goal 1 (Week 1): pt to tolerate OOB upright position for 2 hours PT Short Term Goal 2 (Week 1): pt to demonstrate supine<>sit mod A PT Short Term Goal 3 (Week 1): pt to demonstrate sitting balance CGA PT Short Term Goal 4 (Week 1): pt to demonstrate sit<>stand transfers with LRAD at min A PT Short Term Goal 5 (Week 1): initiate gait training  Skilled Therapeutic Interventions/Progress Updates:     Patient in bed with her daughter present upon PT arrival. Patient alert and agreeable to PT session. Patient denied pain during session.  Therapeutic Activity: Bed Mobility: Patient performed R side-lying to sit with min A and increased time for motor planning. Provided verbal cues and facilitation for initiation to bring her legs off the bed, setting her R elbow, then pushing to sitting. Transfers: Patient performed sit to/from stand x3 with min A and increased time. Patient recalled cues for hand placement to push up from previous session. Provided verbal cues for forward and L weight shift and reaching back to sit for safety.  Gait Training:  Patient ambulated 62 feet and 90 feet with min A and w/c follow due to decreased activity tolerance using bariatric RW for increased stability with weight of the walker due to R lean and patient's fear of falling. Ambulated with step-to gait pattern self selecting to lead with her R foot, progressed to step-through gait pattern with cues and manual facilitation for weight shifting L>R, increased knee flexion in stance on R, able to extend knee with cues, but unable to maintain with gait. Provided additional verbal cues for erect posture and management of RW throughout.  Neuromuscular Re-ed: Patient  performed the following sitting and standing balance activities: -sat EOB with min A for strong R lean, able to maintain with CGA and progress to midline with visual target of touching therapist's shoulder on her L with her L shoulder, patient able to self-correct with cues and maintain sitting balance with supervision >1 min, cued to read signs on the wall looking to the L while maintaining balance -progressed to dynamic sitting balance during dressing sitting EOB, donned pants with total A focused on maintaining midline in sitting with dynamic leg movement and donned a shirt with set-up assist demonstrating technique learned in previous session without cues, provided cues to maintain midline throughout -standing balance >1 min x1 with CGA, focused on elongation of R lower extremity and trunk and L weight shift with visual target (same as in sitting), facilitated L weight shift and provided tactile cues for R trunk elongation  Patient in w/c with her daughter in the room at end of session with breaks locked, seat belt alarm set, and all needs within reach.    Therapy Documentation Precautions:  Precautions Precautions: Fall Restrictions Weight Bearing Restrictions: No   Therapy/Group: Individual Therapy  Cederick Broadnax L Chestine Belknap PT, DPT  11/15/2019, 4:52 PM

## 2019-11-15 NOTE — Progress Notes (Signed)
[place] PHYSICAL MEDICINE & REHABILITATION PROGRESS NOTE   Subjective/Complaints: This morning she perseverates on finding her pillow, voices no other specific complaints. Nursing reports no BM since 31st- sorbitol ordered.  As per nursing patient's engagement is increasing  ROS: Patient denies SOB,negative abd pain, +bowel incont  Objective:   No results found. No results for input(s): WBC, HGB, HCT, PLT in the last 72 hours. No results for input(s): NA, K, CL, CO2, GLUCOSE, BUN, CREATININE, CALCIUM in the last 72 hours.  Intake/Output Summary (Last 24 hours) at 11/15/2019 1036 Last data filed at 11/15/2019 0900 Gross per 24 hour  Intake 1140 ml  Output 225 ml  Net 915 ml        Physical Exam: Vital Signs Blood pressure (!) 144/58, pulse (!) 52, temperature 98.6 F (37 C), temperature source Oral, resp. rate 16, height 5\' 7"  (1.702 m), weight 63.7 kg, SpO2 96 %. General: Alert and oriented x 3, No apparent distress HEENT: Head is normocephalic, atraumatic, PERRLA, EOMI, sclera anicteric, oral mucosa pink and moist, dentition intact, ext ear canals clear,  Neck: Supple without JVD or lymphadenopathy Heart: Reg rate and rhythm. No murmurs rubs or gallops Chest: CTA bilaterally without wheezes, rales, or rhonchi; no distress Abdomen: Soft, non-tender, non-distended, bowel sounds positive. Extremities: No clubbing, cyanosis, or edema. Pulses are 2+ Skin: Clean and intact without signs of breakdown  Neuro: eyes opened. Follows commands. Initiates conversation and demonstrates fair insight and awareness. Poor visual acuity/depth perception when eyes are open. 4/5 RUE, LUE, LLE, RLE  Senses LT in all 4's.  Musculoskeletal:mild shoulder pain mainly at night    Assessment/Plan: 1. Functional deficits secondary to right PCA infarct which require 3+ hours per day of interdisciplinary therapy in a comprehensive inpatient rehab setting.  Physiatrist is providing close team  supervision and 24 hour management of active medical problems listed below.  Physiatrist and rehab team continue to assess barriers to discharge/monitor patient progress toward functional and medical goals  Care Tool:  Bathing    Body parts bathed by patient: Chest, Abdomen, Face   Body parts bathed by helper: Right arm, Left arm, Front perineal area, Right upper leg, Buttocks, Left upper leg, Right lower leg, Left lower leg     Bathing assist Assist Level: 2 Helpers     Upper Body Dressing/Undressing Upper body dressing        Upper body assist Assist Level: Total Assistance - Patient < 25%    Lower Body Dressing/Undressing Lower body dressing      What is the patient wearing?: Incontinence brief     Lower body assist Assist for lower body dressing: Moderate Assistance - Patient 50 - 74%     Toileting Toileting    Toileting assist Assist for toileting: 2 Helpers     Transfers Chair/bed transfer  Transfers assist  Chair/bed transfer activity did not occur: Safety/medical concerns  Chair/bed transfer assist level: Maximal Assistance - Patient 25 - 49%     Locomotion Ambulation   Ambulation assist   Ambulation activity did not occur: Safety/medical concerns          Walk 10 feet activity   Assist  Walk 10 feet activity did not occur: Safety/medical concerns        Walk 50 feet activity   Assist Walk 50 feet with 2 turns activity did not occur: Safety/medical concerns         Walk 150 feet activity   Assist Walk 150 feet activity did not  occur: Safety/medical concerns         Walk 10 feet on uneven surface  activity   Assist Walk 10 feet on uneven surfaces activity did not occur: Safety/medical concerns         Wheelchair     Assist Will patient use wheelchair at discharge?: Yes Type of Wheelchair: Manual Wheelchair activity did not occur: Safety/medical concerns (pt unable to participate in [facility] assessment this date  2/2 severe nausea and vomiting and unable to transfer OOB at this time.)         Wheelchair 50 feet with 2 turns activity    Assist    Wheelchair 50 feet with 2 turns activity did not occur: Safety/medical concerns       Wheelchair 150 feet activity     Assist  Wheelchair 150 feet activity did not occur: Safety/medical concerns       Blood pressure (!) 144/58, pulse (!) 52, temperature 98.6 F (37 C), temperature source Oral, resp. rate 16, height 5\' 7"  (1.702 m), weight 63.7 kg, SpO2 96 %.  Medical Problem List and Plan: 1.  Decreased functional mobility with falls loss of vision left peripheral field secondary to right PCA infarction in the setting of left vertebral artery occlusion along with severe left ICA stenosis with previously silent subacute left parietal infarct secondary to large vessel disease.             -patient may shower             -ELOS/Goals: 12-16 days/Min/Mod A         -Continue CIR 2.  Antithrombotics: -DVT/anticoagulation: Lovenox             -antiplatelet therapy: Aspirin 81 mg daily and Plavix 25 mg daily x3 months then Plavix alone 3. Pain Management: Lyrica 25 mg daily, Hx post herpetic neuralgia, allergy to lidocaine, increase lyrica to 50mg . Currently pain appears to be well controlled.  4. Mood: Provide emotional support, family supportive             -antipsychotic agents: N/A 5. Neuropsych: This patient is capable of making decisions on her own behalf. 6. Skin/Wound Care: Routine skin checks 7. Fluids/Electrolytes/Nutrition: intake poor to inconsistent d/t nausea  -encouraged her to push po as much as she can. 8.  Hypertension.  Coreg 12.5 mg twice daily.  Monitor with increased mobility.    Vitals:   11/14/19 2002 11/15/19 0515  BP: (!) 124/48 (!) 144/58  Pulse: (!) 56 (!) 52  Resp: 20 16  Temp: 97.8 F (36.6 C) 98.6 F (37 C)  SpO2:  96%  generally normalizing , monitor , HR on low side would not increase coreg   11/4:  Adequate control 9.  Diabetes mellitus peripheral neuropathy.  Hemoglobin A1c 10.1.  SSI.  Patient on Glucophage 500 mg twice daily prior to admission.  Resume as needed             CBG (last 3)  Recent Labs    11/14/19 1648 11/14/19 2108 11/15/19 0609  GLUCAP 156* 234* 152*  pm elevation ,start am metformin  11/4: CBG elevated to 234 last night. Increase Metformin to BID 10.  Hyperlipidemia.  Lipitor 12.  Tobacco abuse.  Counseling 13.  Vestibular sx: added scopolamine patch with some improvement. Still limiting PT participation  -scheduled zofran   -acclimation, OOB  -11/2 spoke to pt/daughter.discussed favorable prognosis for improvement of vestibular sx but that it may take several weeks    LOS: 6  days A FACE TO FACE EVALUATION WAS PERFORMED  Drema Pry Libby Goehring 11/15/2019, 10:36 AM

## 2019-11-15 NOTE — Progress Notes (Signed)
Occupational Therapy Session Note  Patient Details  Name: Chelsea [name] MRN: [id number] Date of Birth: 1945-03-29  Today's Date: 11/15/2019 OT Individual Time: 4163-8453 OT Individual Time Calculation (min): 40 min    Short Term Goals: Week 1:  OT Short Term Goal 1 (Week 1): Pt will sit EOB/EOM for 5 min with MOD A for sitting balance OT Short Term Goal 2 (Week 1): Pt will transfer to w/c in prep for BSC/toilet transfer wiht MAX A of 1 OT Short Term Goal 3 (Week 1): Pt will complete 1/4 steps of UB dressing OT Short Term Goal 4 (Week 1): Pt will groom seated at sink with MOD A   Skilled Therapeutic Interventions/Progress Updates:    Pt greeted at time of session sitting up in wheelchair agreeable to OT session with encouragement. C/o Nausea but also that she had been premedicated prior to session. Agreeable to attempt toilet transfer for practice. Daughters present throughout session. Brought to bathroom via wheelchair and baratric drop arm placed over toilet, Squat pivot w/c > BSC over toilet with Max A toward R side with pt very fearful of falling and this was limiting. Once regaining composure, pt with R lateral lean began to subside for upright sitting with only mild R lean, cues to correct and pt able to follow. Cues for hand/foot placement throughout transfers. When rested, squat pivot back to wheelchair Mod/Max with L hand placement on wheelchair armrest. Hand hygiene seated with Supervision, oral hygiene seated Min A. Requesting to rest before next session and lay down, squat pivot w/c > bed with Max A toward L side. Sit to supine Mod A. Alarm on, call bell in reach, daughters present.   Therapy Documentation Precautions:  Precautions Precautions: Fall Restrictions Weight Bearing Restrictions: No     Therapy/Group: Individual Therapy  Erasmo Score 11/15/2019, 5:02 PM

## 2019-11-16 ENCOUNTER — Inpatient Hospital Stay (HOSPITAL_COMMUNITY): Payer: PPO | Admitting: Physical Therapy

## 2019-11-16 ENCOUNTER — Inpatient Hospital Stay (HOSPITAL_COMMUNITY): Payer: PPO | Admitting: Occupational Therapy

## 2019-11-16 LAB — GLUCOSE, CAPILLARY
Glucose-Capillary: 158 mg/dL — ABNORMAL HIGH (ref 70–99)
Glucose-Capillary: 215 mg/dL — ABNORMAL HIGH (ref 70–99)
Glucose-Capillary: 165 mg/dL — ABNORMAL HIGH (ref 70–99)
Glucose-Capillary: 177 mg/dL — ABNORMAL HIGH (ref 70–99)

## 2019-11-16 LAB — CREATININE, SERUM
Creatinine, Ser: 1.09 mg/dL — ABNORMAL HIGH (ref 0.44–1.00)
GFR, Estimated: 53 mL/min — ABNORMAL LOW (ref >60)

## 2019-11-16 NOTE — Progress Notes (Signed)
Occupational Therapy Weekly Progress Note  Patient Details  Name: Chelsea [name] MRN: [id number] Date of Birth: August 22, 1945  Beginning of progress report period: November 10, 2019 End of progress report period: October 16, 2019  Today's Date: 11/16/2019 OT Individual Time: 6606-3016 OT Individual Time Calculation (min): 70 min    Patient has met 4 of 4 short term goals. Patient is making steady progress toward goals demonstrating ability to maintain static sitting balance at EOB for >15 minutes with initial Mod A progressing to close supervision A with visual feedback from standing mirror. Patient also demonstrates squat pivot transfers with Min A at best and Max A at most with fatigue/nausea. Upon initial evaluation, patient was requiring Mod to Max A for UB bathing/dressing. Patient currently able to don UB clothing with Min A. Patient demonstrates increased tolerance for OOB activity with ability to complete 3/3 grooming tasks seated at sink level in wc with set-up assist.   Patient continues to demonstrate the following deficits: muscle weakness, decreased cardiorespiratoy endurance, impaired timing and sequencing, unbalanced muscle activation, motor apraxia, decreased coordination and decreased motor planning, decreased visual acuity and decreased visual perceptual skills, decreased motor planning, decreased attention, decreased awareness, decreased problem solving, decreased safety awareness, decreased memory and delayed processing and decreased sitting balance, decreased standing balance, decreased postural control, hemiplegia and decreased balance strategiesand therefore will continue to benefit from skilled OT intervention to enhance overall performance with BADL and Reduce care partner burden.  Patient progressing toward long term goals..  Continue plan of care.  OT Short Term Goals Week 1:  OT Short Term Goal 1 (Week 1): Pt will sit EOB/EOM for 5 min with MOD A for sitting balance OT  Short Term Goal 1 - Progress (Week 1): Met OT Short Term Goal 2 (Week 1): Pt will transfer to w/c in prep for BSC/toilet transfer wiht MAX A of 1 OT Short Term Goal 2 - Progress (Week 1): Met OT Short Term Goal 3 (Week 1): Pt will complete 1/4 steps of UB dressing OT Short Term Goal 3 - Progress (Week 1): Met OT Short Term Goal 4 (Week 1): Pt will groom seated at sink with MOD A OT Short Term Goal 4 - Progress (Week 1): Met Week 2:  OT Short Term Goal 1 (Week 2): Patient will complete squat-pivot transfers with Mod A. OT Short Term Goal 2 (Week 2): Patient will thread BLE through LB clothing seated EOB. OT Short Term Goal 3 (Week 2): Patient will tolerate OOB activity for 1hr in prep for return to PLOF. OT Short Term Goal 4 (Week 2): Patient will demonstrate dynamic sitting balance with Min A in prep for LB BADLs.  Skilled Therapeutic Interventions/Progress Updates:  Patient met in R side-lying asleep but easily awaken. Daughter present at bedside. 0/10 pain at rest and with activity. Side-lying to EOB with Mod A to bring trunk upright. Patient with initial severe R lateral lean. Static sitting balance with visual feedback from standing mirror with progress from requiring Mod A to close supervision A with occasional cues. Max A to don footwear. Seated EOB, patient consumed snack of peanut butter, crackers and grape juice with ability to open food packages after set-up. Patient then able to reach outside of base of support to throw trash into trash can. Sit to stand in [place] with Mod to Min A to boost with additional cueing for upright posture. Stand to sit in wc with Min A for controlled descent. Seated at sink level, patient  performed 3/3 grooming tasks with set-up assist. Patient in agreement with sitting in wc upon OT's exit from room. Session concluded with belt alarm activated, call bell within reach, and daughter present at bedside with instruction for pressing call bell when patient is ready to  return to supine.   Therapy Documentation Precautions:  Precautions Precautions: Fall Restrictions Weight Bearing Restrictions: No General:    Therapy/Group: Individual Therapy  Lamanda Rudder R Howerton-Davis 11/16/2019, 7:32 AM

## 2019-11-16 NOTE — Progress Notes (Signed)
This patient had a large BM after receiving po prune juice and encourage liquids,, states she is begriming to feel a lot better after episodes. Made as comfortable as possible, VS monitor, denies pain, resting comfortably states patient and daughter

## 2019-11-16 NOTE — Progress Notes (Signed)
Patient noted not to have had a BM since 11/11/19, states she is uncomfortable and "need some help since the medication given to her earlier didn't seem to work". Abdomen assessed,some distended,taut with tenderness noted,Dulcolax suppository administered noted moderate hard round stool present upon insertion, she was repositioned and able to insert suppository.,po liquids encouraged.Daughter at bedside.

## 2019-11-16 NOTE — Progress Notes (Signed)
Physical Therapy Weekly Progress Note  Patient Details  Name: Chelsea [name] MRN: [id number] Date of Birth: [birth year]  Beginning of progress report period: November 10, 2019 End of progress report period: November 16, 2019  Today's Date: 11/16/2019 PT Individual Time: 0800-0900 AND 1435-1515 PT Individual Time Calculation (min): 60 min and 40 min   Patient has met 4 of 5 short term goals. Pt is making slow progress towards LTG. Over the past week pt has struggled with severe nausea, pushers syndrome, shoulder pain and generalized fatigue limiting participation in PT. Pt has progressed to mod assist stand pivot transfer and has been able to ambulate up to 27f with mod assist, but inconsistent midline orientation limits increased safety with tranfers or gait.   Patient continues to demonstrate the following deficits muscle weakness and muscle joint tightness, decreased cardiorespiratoy endurance, unbalanced muscle activation, motor apraxia, ataxia, decreased coordination and decreased motor planning, decreased visual acuity, decreased visual perceptual skills, decreased visual motor skills, field cut and hemianopsia, decreased midline orientation, decreased attention to left and decreased motor planning, decreased initiation, decreased attention, decreased awareness, decreased problem solving, decreased safety awareness, decreased memory and delayed processing, central origin and decreased sitting balance, decreased standing balance, decreased postural control, decreased balance strategies and difficulty maintaining precautions and therefore will continue to benefit from skilled PT intervention to increase functional independence with mobility.  Patient progressing toward long term goals..  Continue plan of care.  PT Short Term Goals Week 1:  PT Short Term Goal 1 (Week 1): pt to tolerate OOB upright position for 2 hours PT Short Term Goal 1 - Progress (Week 1): Met PT Short Term Goal 2 (Week 1): pt  to demonstrate supine<>sit mod A PT Short Term Goal 2 - Progress (Week 1): Met PT Short Term Goal 3 (Week 1): pt to demonstrate sitting balance CGA PT Short Term Goal 3 - Progress (Week 1): Met PT Short Term Goal 4 (Week 1): pt to demonstrate sit<>stand transfers with LRAD at min A PT Short Term Goal 4 - Progress (Week 1): Not met PT Short Term Goal 5 (Week 1): initiate gait training PT Short Term Goal 5 - Progress (Week 1): Met Week 2:  PT Short Term Goal 1 (Week 2): Pt will perform bed mobility with min assist consistently PT Short Term Goal 2 (Week 2): Pt will perform bed<>WC trasnfer with min assist and LRAD PT Short Term Goal 3 (Week 2): Pt will Ambulate 582fwith mod assist and LRAD  Skilled Therapeutic Interventions/Progress Updates:  Session 1  Pt received supine in bed and agreeable to PT. Supine>sit transfer with min assist increased to due to internal and external distraction and increased R lateral deviation from midline on this day. Sitting balance EOB x 10 minutes while RN present to administer  Medications. Sit<>stand from EOB x 2  with mod assist and max cues for midline on this day. Pt performed stand [facility] and max cues for midline orientation; as [facility] have incontin[facility] bowel movement. Pt then perform stand pivot to [facility] and BUE support on PT for support and blocking the RLE in stance to prevent buckle and improve midline.  Pt able to have small additional bowel movement while sitting on BSC. Sit<>stand with mod assist for pericare with moderate cues for pelvic positioning to reduce pushers syndrome.   Gait training in room 2 x 2074fith mod assist and close WC follow.  Pt required increased cues for sequencing, posture, adequate weight shifting, and midline to prevent lateral LOB to the R compared to previous sessions   Pt returned to room and performed stand pivot transfer to bed with RUE support on bed rail  and min assist from PT. Sit>supine completed with supervision assist and increased time, and left supine in bed with call bell in reach and all needs met.    Session 2.  Pt received supine in bed and agreeable to PT with significant encouragement. Pt complaining of severe shoulder pain, but did not indicate number. PT requested K[facility] from [place] for pain management. Supine>sit transfer with mod assist and max cues  For use of  Use of RUE. Sitting balance EOB with CGA progressing to min assist with max cues initially to use visual feedback to find midline orientation, rather than keep eyes closed. Forward/lateral reach to the L to force improved weight shift over the L hip and visual scanning to L x 10 with moderate cues for attention to task and awareness L and superior visual field. Side step at EOB with mod assist to the R. Sit>supine completed with min assist at the RLE, and left supine in bed with call bell in reach and all needs met.        Therapy Documentation Precautions:  Precautions Precautions: Fall Restrictions Weight Bearing Restrictions: No    Vital Signs: Therapy Vitals Pulse Rate: 62 BP: (!) 166/56 Patient Position (if appropriate): Lying Pain:   see above   Therapy/Group: Individual Therapy  Lorie Phenix 11/16/2019, 11:05 AM

## 2019-11-16 NOTE — Progress Notes (Signed)
[facility] PHYSICAL MEDICINE & REHABILITATION PROGRESS NOTE   Subjective/Complaints: Pt slept poorly due to freq BMs after laxatives Still having neuropathic pain left shoulder (hx post herpetic neuralgia) ROS: Patient denies SOB,negative abd pain, +bowel incont  Objective:   No results found. No results for input(s): WBC, HGB, HCT, PLT in the last 72 hours. Recent Labs    11/16/19 0428  CREATININE 1.09*    Intake/Output Summary (Last 24 hours) at 11/16/2019 1041 Last data filed at 11/16/2019 0630 Gross per 24 hour  Intake 537 ml  Output 925 ml  Net -388 ml        Physical Exam: Vital Signs Blood pressure (!) 166/56, pulse 62, temperature 99 F (37.2 C), temperature source Oral, resp. rate 18, height 5\' 7"  (1.702 m), weight 63.7 kg, SpO2 98 %.  General: No acute distress Mood and affect are appropriate Heart: Regular rate and rhythm no rubs murmurs or extra sounds Lungs: Clear to auscultation, breathing unlabored, no rales or wheezes Abdomen: Positive bowel sounds, soft nontender to palpation, nondistended Extremities: No clubbing, cyanosis, or edema Skin: No evidence of breakdown, no evidence of rash   Neuro: eyes opened. Follows commands. Initiates conversation and demonstrates fair insight and awareness. Poor visual acuity/depth perception when eyes are open. 4/5 RUE, LUE, LLE, RLE  Senses LT in all 4's.  Musculoskeletal:mild shoulder pain mainly at night    Assessment/Plan: 1. Functional deficits secondary to right PCA infarct which require 3+ hours per day of interdisciplinary therapy in a comprehensive inpatient rehab setting.  Physiatrist is providing close team supervision and 24 hour management of active medical problems listed below.  Physiatrist and rehab team continue to assess barriers to discharge/monitor patient progress toward functional and medical goals  Care Tool:  Bathing    Body parts bathed by patient: Chest, Abdomen, Face   Body parts  bathed by helper: Right arm, Left arm, Front perineal area, Right upper leg, Buttocks, Left upper leg, Right lower leg, Left lower leg     Bathing assist Assist Level: 2 Helpers     Upper Body Dressing/Undressing Upper body dressing        Upper body assist Assist Level: Total Assistance - Patient < 25%    Lower Body Dressing/Undressing Lower body dressing      What is the patient wearing?: Incontinence brief     Lower body assist Assist for lower body dressing: Moderate Assistance - Patient 50 - 74%     Toileting Toileting    Toileting assist Assist for toileting: 2 Helpers     Transfers Chair/bed transfer  Transfers assist  Chair/bed transfer activity did not occur: Safety/medical concerns  Chair/bed transfer assist level: Minimal Assistance - Patient > 75%     Locomotion Ambulation   Ambulation assist   Ambulation activity did not occur: Safety/medical concerns  Assist level: Minimal Assistance - Patient > 75% Assistive device: Walker-rolling Max distance: 90 ft   Walk 10 feet activity   Assist  Walk 10 feet activity did not occur: Safety/medical concerns  Assist level: Minimal Assistance - Patient > 75% Assistive device: Walker-rolling   Walk 50 feet activity   Assist Walk 50 feet with 2 turns activity did not occur: Safety/medical concerns  Assist level: Minimal Assistance - Patient > 75% Assistive device: Walker-rolling    Walk 150 feet activity   Assist Walk 150 feet activity did not occur: Safety/medical concerns         Walk 10 feet on uneven surface  activity  Assist Walk 10 feet on uneven surfaces activity did not occur: Safety/medical concerns         Wheelchair     Assist Will patient use wheelchair at discharge?: Yes Type of Wheelchair: Manual Wheelchair activity did not occur: Safety/medical concerns (pt unable to participate in [facility] assessment this date 2/2 severe nausea and vomiting and unable to transfer  OOB at this time.)         Wheelchair 50 feet with 2 turns activity    Assist    Wheelchair 50 feet with 2 turns activity did not occur: Safety/medical concerns       Wheelchair 150 feet activity     Assist  Wheelchair 150 feet activity did not occur: Safety/medical concerns       Blood pressure (!) 166/56, pulse 62, temperature 99 F (37.2 C), temperature source Oral, resp. rate 18, height 5\' 7"  (1.702 m), weight 63.7 kg, SpO2 98 %.  Medical Problem List and Plan: 1.  Decreased functional mobility with falls loss of vision left peripheral field secondary to right PCA infarction in the setting of left vertebral artery occlusion along with severe left ICA stenosis with previously silent subacute left parietal infarct secondary to large vessel disease.             -patient may shower             -ELOS/Goals: 12-16 days/Min/Mod A         -Continue CIR 2.  Antithrombotics: -DVT/anticoagulation: Lovenox             -antiplatelet therapy: Aspirin 81 mg daily and Plavix 25 mg daily x3 months then Plavix alone 3. Pain Management: Lyrica 25 mg daily, Hx post herpetic neuralgia, allergy to lidocaine, increase lyrica to 75mg . .  4. Mood: Provide emotional support, family supportive             -antipsychotic agents: N/A 5. Neuropsych: This patient is capable of making decisions on her own behalf. 6. Skin/Wound Care: Routine skin checks 7. Fluids/Electrolytes/Nutrition: intake poor to inconsistent d/t nausea  -encouraged her to push po as much as she can. 8.  Hypertension.  Coreg 12.5 mg twice daily.  Monitor with increased mobility.    Vitals:   11/16/19 0351 11/16/19 0813  BP: 94/63 (!) 166/56  Pulse: (!) 58 62  Resp:    Temp: 99 F (37.2 C)   SpO2: 98%   labile systolic BP, will check orthostatic vitals HR on low side would not increase coreg   11/4: Adequate control 9.  Diabetes mellitus peripheral neuropathy.  Hemoglobin A1c 10.1.  SSI.  Patient on Glucophage 500 mg  twice daily prior to admission.  Resume as needed             CBG (last 3)  Recent Labs    11/15/19 1610 11/15/19 2009 11/16/19 0602  GLUCAP 136* 245* 158*  pm elevation ,start am metformin  11/4: CBG elevated to 234 last night. Increase Metformin to BID-cont to monitor on this dose 10.  Hyperlipidemia.  Lipitor 12.  Tobacco abuse.  Counseling 13.  Vestibular sx: added scopolamine patch with some improvement. Still limiting PT participation  -scheduled zofran   -acclimation, OOB  -11/2 spoke to pt/daughter.discussed favorable prognosis for improvement of vestibular sx but that it may take several weeks    LOS: 7 days A FACE TO FACE EVALUATION WAS PERFORMED  13/4 11/16/2019, 10:41 AM

## 2019-11-17 ENCOUNTER — Inpatient Hospital Stay (HOSPITAL_COMMUNITY): Payer: PPO

## 2019-11-17 LAB — GLUCOSE, CAPILLARY
Glucose-Capillary: 152 mg/dL — ABNORMAL HIGH (ref 70–99)
Glucose-Capillary: 204 mg/dL — ABNORMAL HIGH (ref 70–99)
Glucose-Capillary: 222 mg/dL — ABNORMAL HIGH (ref 70–99)
Glucose-Capillary: 132 mg/dL — ABNORMAL HIGH (ref 70–99)

## 2019-11-17 MED ORDER — PREGABALIN 25 MG PO CAPS
25.0000 mg | ORAL_CAPSULE | Freq: Once | ORAL | Status: AC
  Administered 2019-11-17: 25 mg via ORAL
  Filled 2019-11-17: qty 1

## 2019-11-17 MED ORDER — PREGABALIN 75 MG PO CAPS
75.0000 mg | ORAL_CAPSULE | Freq: Every evening | ORAL | Status: DC
  Administered 2019-11-18 – 2019-11-19 (×2): 75 mg via ORAL
  Filled 2019-11-17 (×2): qty 1

## 2019-11-17 MED ORDER — LIDOCAINE 5 % EX PTCH
1.0000 | MEDICATED_PATCH | CUTANEOUS | Status: DC
  Administered 2019-11-17 – 2019-11-18 (×2): 1 via TRANSDERMAL
  Filled 2019-11-17 (×3): qty 1

## 2019-11-17 NOTE — Progress Notes (Signed)
Notified Dr. Dalene Carrow regarding pt increased in left shoulder pain. New orders received.  Mylo Red LPN

## 2019-11-17 NOTE — Progress Notes (Signed)
Requesting order per Dr. Larna Daughters progress note for Lyrica to be increased to 75 mg

## 2019-11-17 NOTE — Progress Notes (Signed)
[facility] PHYSICAL MEDICINE & REHABILITATION PROGRESS NOTE   Subjective/Complaints: She fell yesterday and hit her head. She did not hit her shoulder but was pulled up by her left shoulder and her pain is aggravated here. XR obtained and appears to be normal, awaiting report.  CBGs still elevated to 200s  ROS: Patient denies SOB,negative abd pain, +bowel incont  Objective:   No results found. No results for input(s): WBC, HGB, HCT, PLT in the last 72 hours. Recent Labs    11/16/19 0428  CREATININE 1.09*    Intake/Output Summary (Last 24 hours) at 11/17/2019 1200 Last data filed at 11/17/2019 0603 Gross per 24 hour  Intake 440 ml  Output 1600 ml  Net -1160 ml        Physical Exam: Vital Signs Blood pressure (!) 145/54, pulse (!) 51, temperature 98.2 F (36.8 C), temperature source Oral, resp. rate 19, height 5\' 7"  (1.702 m), weight 64.4 kg, SpO2 98 %. General: Alert and oriented x 3, No apparent distress HEENT: Head is normocephalic, atraumatic, PERRLA, EOMI, sclera anicteric, oral mucosa pink and moist, dentition intact, ext ear canals clear,  Neck: Supple without JVD or lymphadenopathy Heart: Bradycardia. No murmurs rubs or gallops Chest: CTA bilaterally without wheezes, rales, or rhonchi; no distress Abdomen: Soft, non-tender, non-distended, bowel sounds positive. Extremities: No clubbing, cyanosis, or edema. Pulses are 2+  Neuro: eyes opened. Follows commands. Initiates conversation and demonstrates fair insight and awareness. Poor visual acuity/depth perception when eyes are open. 4/5 RUE, LUE, LLE, RLE  Senses LT in all 4's.  Musculoskeletal:mild shoulder pain mainly at night    Assessment/Plan: 1. Functional deficits secondary to right PCA infarct which require 3+ hours per day of interdisciplinary therapy in a comprehensive inpatient rehab setting.  Physiatrist is providing close team supervision and 24 hour management of active medical problems listed  below.  Physiatrist and rehab team continue to assess barriers to discharge/monitor patient progress toward functional and medical goals  Care Tool:  Bathing    Body parts bathed by patient: Chest, Abdomen, Face   Body parts bathed by helper: Right arm, Left arm, Front perineal area, Right upper leg, Buttocks, Left upper leg, Right lower leg, Left lower leg     Bathing assist Assist Level: 2 Helpers     Upper Body Dressing/Undressing Upper body dressing        Upper body assist Assist Level: Total Assistance - Patient < 25%    Lower Body Dressing/Undressing Lower body dressing      What is the patient wearing?: Incontinence brief     Lower body assist Assist for lower body dressing: Moderate Assistance - Patient 50 - 74%     Toileting Toileting    Toileting assist Assist for toileting: 2 Helpers     Transfers Chair/bed transfer  Transfers assist  Chair/bed transfer activity did not occur: Safety/medical concerns  Chair/bed transfer assist level: Minimal Assistance - Patient > 75%     Locomotion Ambulation   Ambulation assist   Ambulation activity did not occur: Safety/medical concerns  Assist level: Minimal Assistance - Patient > 75% Assistive device: Walker-rolling Max distance: 90 ft   Walk 10 feet activity   Assist  Walk 10 feet activity did not occur: Safety/medical concerns  Assist level: Minimal Assistance - Patient > 75% Assistive device: Walker-rolling   Walk 50 feet activity   Assist Walk 50 feet with 2 turns activity did not occur: Safety/medical concerns  Assist level: Minimal Assistance - Patient > 75%  Assistive device: Walker-rolling    Walk 150 feet activity   Assist Walk 150 feet activity did not occur: Safety/medical concerns         Walk 10 feet on uneven surface  activity   Assist Walk 10 feet on uneven surfaces activity did not occur: Safety/medical concerns         Wheelchair     Assist Will  patient use wheelchair at discharge?: Yes Type of Wheelchair: Manual Wheelchair activity did not occur: Safety/medical concerns (pt unable to participate in [facility] assessment this date 2/2 severe nausea and vomiting and unable to transfer OOB at this time.)         Wheelchair 50 feet with 2 turns activity    Assist    Wheelchair 50 feet with 2 turns activity did not occur: Safety/medical concerns       Wheelchair 150 feet activity     Assist  Wheelchair 150 feet activity did not occur: Safety/medical concerns       Blood pressure (!) 145/54, pulse (!) 51, temperature 98.2 F (36.8 C), temperature source Oral, resp. rate 19, height 5\' 7"  (1.702 m), weight 64.4 kg, SpO2 98 %.  Medical Problem List and Plan: 1.  Decreased functional mobility with falls loss of vision left peripheral field secondary to right PCA infarction in the setting of left vertebral artery occlusion along with severe left ICA stenosis with previously silent subacute left parietal infarct secondary to large vessel disease.             -patient may shower             -ELOS/Goals: 12-16 days/Min/Mod A         -Continue CIR 2.  Antithrombotics: -DVT/anticoagulation: Lovenox             -antiplatelet therapy: Aspirin 81 mg daily and Plavix 25 mg daily x3 months then Plavix alone 3. Pain Management: Lyrica 25 mg daily, Hx post herpetic neuralgia, patient is willing to try lidocaine patch again as she is not sure she had hives allergy, increase lyrica to 75mg . .  4. Mood: Provide emotional support, family supportive             -antipsychotic agents: N/A 5. Neuropsych: This patient is capable of making decisions on her own behalf. 6. Skin/Wound Care: Routine skin checks 7. Fluids/Electrolytes/Nutrition: intake poor to inconsistent d/t nausea  -encouraged her to push po as much as she can. 8.  Hypertension.  Coreg 12.5 mg twice daily.  Monitor with increased mobility.    Vitals:   11/17/19 0554 11/17/19 0824   BP: (!) 160/69 (!) 145/54  Pulse: (!) 56 (!) 51  Resp: 19   Temp: 98.2 F (36.8 C)   SpO2: 95% 98%  labile systolic BP, will check orthostatic vitals HR on low side would not increase coreg   11/6: BP labile 9.  Diabetes mellitus peripheral neuropathy.  Hemoglobin A1c 10.1.  SSI.  Patient on Glucophage 500 mg twice daily prior to admission.  Resume as needed             CBG (last 3)  Recent Labs    11/16/19 2109 11/17/19 0554 11/17/19 1118  GLUCAP 177* 152* 204*  pm elevation ,start am metformin  11/4: CBG elevated to 234 last night. Increase Metformin to BID-cont to monitor on this dose  11/6: provided dietary education 10.  Hyperlipidemia.  Lipitor 12.  Tobacco abuse.  Counseling 13.  Vestibular sx: added scopolamine patch with some  improvement. Still limiting PT participation  -scheduled zofran   -acclimation, OOB  -11/2 spoke to pt/daughter.discussed favorable prognosis for improvement of vestibular sx but that it may take several weeks    LOS: 8 days A FACE TO FACE EVALUATION WAS PERFORMED  Emmitt Matthews P Kaleea Penner 11/17/2019, 12:00 PM

## 2019-11-18 ENCOUNTER — Inpatient Hospital Stay (HOSPITAL_COMMUNITY): Payer: PPO | Admitting: Physical Therapy

## 2019-11-18 LAB — GLUCOSE, CAPILLARY
Glucose-Capillary: 139 mg/dL — ABNORMAL HIGH (ref 70–99)
Glucose-Capillary: 108 mg/dL — ABNORMAL HIGH (ref 70–99)
Glucose-Capillary: 139 mg/dL — ABNORMAL HIGH (ref 70–99)
Glucose-Capillary: 130 mg/dL — ABNORMAL HIGH (ref 70–99)

## 2019-11-18 MED ORDER — CARVEDILOL 6.25 MG PO TABS
6.2500 mg | ORAL_TABLET | Freq: Two times a day (BID) | ORAL | Status: DC
  Administered 2019-11-18 – 2019-11-20 (×4): 6.25 mg via ORAL
  Filled 2019-11-18 (×4): qty 1

## 2019-11-18 NOTE — Progress Notes (Signed)
Physical Therapy Session Note  Patient Details  Name: Chelsea [name] MRN: [id number] Date of Birth: 08-Apr-1945  Today's Date: 11/18/2019 PT Individual Time: 0807-0903 PT Individual Time Calculation (min): 56 min   Short Term Goals: Week 2:  PT Short Term Goal 1 (Week 2): Pt will perform bed mobility with min assist consistently PT Short Term Goal 2 (Week 2): Pt will perform bed<>WC trasnfer with min assist and LRAD PT Short Term Goal 3 (Week 2): Pt will Ambulate 4f with mod assist and LRAD  Skilled Therapeutic Interventions/Progress Updates: Pt presented in bed sleeping but easily aroused and agreeable to therapy, dgt LLattie Hawpresent throughout session. Pt states mild pain in L shoulder but no intervention required and indicates that Lidocaine patch as been helping. PTA disucssed with pt increasing OOB time and recommended trial with recliner for 1-2 hours to which pt was agreeable. Pt performed supine to sit with supervision with increased time and heavy use of bed features. Pt noted to achieve and maintain midline orientation once at EOB. Pt then performed stand pivot transfer to recliner with modA with use of bari walker. Pt performed well with verbal step by step cues for sequencing and to engage R quads to minimize knee buckling. Pt then performed toe taps to 2in step x5 then x 10 with modA for first set and minA with improved midline orientation on second set. Pt then performed alternating toe taps x 10 with minA with decreased R lean. Pt noted to become more distracted towards end of session both internally and externally with pt speaking constantly to dgt. Pt was repositioned to comfort in recliner at end of session and left with seat alarm on, call bell within reach and needs met.      Therapy Documentation Precautions:  Precautions Precautions: Fall Restrictions Weight Bearing Restrictions: No    Therapy/Group: Individual Therapy  Loanne Emery  Anelis Hrivnak,  PTA  11/18/2019, 12:58 PM

## 2019-11-18 NOTE — Progress Notes (Signed)
[facility] PHYSICAL MEDICINE & REHABILITATION PROGRESS NOTE   Subjective/Complaints: Patient is sleeping currently, daughter [name] is at her bedside. She says she did well with therapy today. She sleeps with her at night as patient gets nervous at night Lidocaine patch and Lyrica helped with pain, discussed aspiration of calcification as outpatient  ROS: Patient denies SOB,negative abd pain, +bowel incont  Objective:   DG Shoulder Left  Result Date: 11/17/2019 CLINICAL DATA:  Left shoulder neuropathic pain.  No reported injury. EXAM: LEFT SHOULDER - 2+ VIEW COMPARISON:  None. FINDINGS: No fracture. No glenohumeral dislocation. No evidence of acromioclavicular separation. No suspicious focal osseous lesions. No significant arthropathy. Tiny soft tissue calcifications adjacent to the posterolateral margin of the greater tuberosity. No radiopaque foreign bodies. IMPRESSION: Tiny soft tissue calcifications adjacent to the posterolateral margin of the greater tuberosity, suggesting calcific tendinopathy/bursitis. No acute osseous abnormality. Electronically Signed   By: Delbert Phenix M.D.   On: 11/17/2019 17:07   No results for input(s): WBC, HGB, HCT, PLT in the last 72 hours. Recent Labs    11/16/19 0428  CREATININE 1.09*    Intake/Output Summary (Last 24 hours) at 11/18/2019 1123 Last data filed at 11/18/2019 0735 Gross per 24 hour  Intake 365 ml  Output 1275 ml  Net -910 ml        Physical Exam: Vital Signs Blood pressure (!) 162/53, pulse (!) 59, temperature 98.3 F (36.8 C), temperature source Oral, resp. rate 16, height 5\' 7"  (1.702 m), weight 64.6 kg, SpO2 96 %.  General: Alert and oriented x 3, No apparent distress HEENT: Head is normocephalic, atraumatic, PERRLA, EOMI, sclera anicteric, oral mucosa pink and moist, dentition intact, ext ear canals clear,  Neck: Supple without JVD or lymphadenopathy Heart: Reg rate and rhythm. No murmurs rubs or gallops Chest: CTA bilaterally  without wheezes, rales, or rhonchi; no distress Abdomen: Soft, non-tender, non-distended, bowel sounds positive. Extremities: No clubbing, cyanosis, or edema. Pulses are 2+ Skin: Clean and intact without signs of breakdown  Neuro: eyes opened. Follows commands. Initiates conversation and demonstrates fair insight and awareness. Poor visual acuity/depth perception when eyes are open. 4/5 RUE, LUE, LLE, RLE  Senses LT in all 4's.  Musculoskeletal:mild shoulder pain mainly at night    Assessment/Plan: 1. Functional deficits secondary to right PCA infarct which require 3+ hours per day of interdisciplinary therapy in a comprehensive inpatient rehab setting.  Physiatrist is providing close team supervision and 24 hour management of active medical problems listed below.  Physiatrist and rehab team continue to assess barriers to discharge/monitor patient progress toward functional and medical goals  Care Tool:  Bathing    Body parts bathed by patient: Chest, Abdomen, Face   Body parts bathed by helper: Right arm, Left arm, Front perineal area, Right upper leg, Buttocks, Left upper leg, Right lower leg, Left lower leg     Bathing assist Assist Level: 2 Helpers     Upper Body Dressing/Undressing Upper body dressing        Upper body assist Assist Level: Total Assistance - Patient < 25%    Lower Body Dressing/Undressing Lower body dressing      What is the patient wearing?: Incontinence brief     Lower body assist Assist for lower body dressing: Moderate Assistance - Patient 50 - 74%     Toileting Toileting    Toileting assist Assist for toileting: 2 Helpers     Transfers Chair/bed transfer  Transfers assist  Chair/bed transfer activity did not  occur: Safety/medical concerns  Chair/bed transfer assist level: Minimal Assistance - Patient > 75%     Locomotion Ambulation   Ambulation assist   Ambulation activity did not occur: Safety/medical concerns  Assist  level: Minimal Assistance - Patient > 75% Assistive device: Walker-rolling Max distance: 90 ft   Walk 10 feet activity   Assist  Walk 10 feet activity did not occur: Safety/medical concerns  Assist level: Minimal Assistance - Patient > 75% Assistive device: Walker-rolling   Walk 50 feet activity   Assist Walk 50 feet with 2 turns activity did not occur: Safety/medical concerns  Assist level: Minimal Assistance - Patient > 75% Assistive device: Walker-rolling    Walk 150 feet activity   Assist Walk 150 feet activity did not occur: Safety/medical concerns         Walk 10 feet on uneven surface  activity   Assist Walk 10 feet on uneven surfaces activity did not occur: Safety/medical concerns         Wheelchair     Assist Will patient use wheelchair at discharge?: Yes Type of Wheelchair: Manual Wheelchair activity did not occur: Safety/medical concerns (pt unable to participate in [facility] assessment this date 2/2 severe nausea and vomiting and unable to transfer OOB at this time.)         Wheelchair 50 feet with 2 turns activity    Assist    Wheelchair 50 feet with 2 turns activity did not occur: Safety/medical concerns       Wheelchair 150 feet activity     Assist  Wheelchair 150 feet activity did not occur: Safety/medical concerns       Blood pressure (!) 162/53, pulse (!) 59, temperature 98.3 F (36.8 C), temperature source Oral, resp. rate 16, height 5\' 7"  (1.702 m), weight 64.6 kg, SpO2 96 %.  Medical Problem List and Plan: 1.  Decreased functional mobility with falls loss of vision left peripheral field secondary to right PCA infarction in the setting of left vertebral artery occlusion along with severe left ICA stenosis with previously silent subacute left parietal infarct secondary to large vessel disease.             -patient may shower             -ELOS/Goals: 12-16 days/Min/Mod A         -Continue CIR 2.   Antithrombotics: -DVT/anticoagulation: Lovenox             -antiplatelet therapy: Aspirin 81 mg daily and Plavix 25 mg daily x3 months then Plavix alone 3. Pain Management: Lyrica 25 mg daily, Hx post herpetic neuralgia, patient is willing to try lidocaine patch again as she is not sure she had hives allergy, increase lyrica to 75mg .   11/7: discussed calcific tendinopathy XR results- Lyrica and patch helping- can consider saline hydrodissection and calcification aspiration as outpatient. (patient prefers less medication) 4. Mood: Provide emotional support, family supportive             -antipsychotic agents: N/A 5. Neuropsych: This patient is capable of making decisions on her own behalf. 6. Skin/Wound Care: Routine skin checks 7. Fluids/Electrolytes/Nutrition: intake poor to inconsistent d/t nausea  -encouraged her to push po as much as she can. 8.  Hypertension.  Coreg 12.5 mg twice daily.  Monitor with increased mobility.    Vitals:   11/17/19 2022 11/18/19 0429  BP: (!) 153/54 (!) 162/53  Pulse: (!) 58 (!) 59  Resp: 18 16  Temp: 99 F (37.2 C)  98.3 F (36.8 C)  SpO2: 97% 96%  labile systolic BP, will check orthostatic vitals HR on low side would not increase coreg   11/7: BP still a little high. Add amlodipine 5mg . Decrease Coreg to 6.25mg  BID as HR is low.   9.  Diabetes mellitus peripheral neuropathy.  Hemoglobin A1c 10.1.  SSI.  Patient on Glucophage 500 mg twice daily prior to admission.  Resume as needed             CBG (last 3)  Recent Labs    11/17/19 1653 11/17/19 2130 11/18/19 0619  GLUCAP 222* 132* 139*  pm elevation ,start am metformin  11/4: CBG elevated to 234 last night. Increase Metformin to BID-cont to monitor on this dose  11/7: provided dietary education. CBG elevated to 222 last night 10.  Hyperlipidemia.  Lipitor 12.  Tobacco abuse.  Counseling 13.  Vestibular sx: added scopolamine patch with some improvement. Still limiting PT  participation  -scheduled zofran   -acclimation, OOB  -11/2 spoke to pt/daughter.discussed favorable prognosis for improvement of vestibular sx but that it may take several weeks    LOS: 9 days A FACE TO FACE EVALUATION WAS PERFORMED  Dontavius Keim P Nataliyah Packham 11/18/2019, 11:23 AM

## 2019-11-19 ENCOUNTER — Inpatient Hospital Stay (HOSPITAL_COMMUNITY): Payer: PPO | Admitting: Occupational Therapy

## 2019-11-19 ENCOUNTER — Inpatient Hospital Stay (HOSPITAL_COMMUNITY): Payer: PPO | Admitting: Physical Therapy

## 2019-11-19 ENCOUNTER — Encounter (HOSPITAL_COMMUNITY): Payer: PPO | Admitting: Psychology

## 2019-11-19 ENCOUNTER — Inpatient Hospital Stay (HOSPITAL_COMMUNITY): Payer: PPO

## 2019-11-19 DIAGNOSIS — R52 Pain, unspecified: Secondary | ICD-10-CM

## 2019-11-19 LAB — GLUCOSE, CAPILLARY
Glucose-Capillary: 97 mg/dL (ref 70–99)
Glucose-Capillary: 154 mg/dL — ABNORMAL HIGH (ref 70–99)
Glucose-Capillary: 140 mg/dL — ABNORMAL HIGH (ref 70–99)
Glucose-Capillary: 169 mg/dL — ABNORMAL HIGH (ref 70–99)

## 2019-11-19 NOTE — Progress Notes (Signed)
Patient ID: Chelsea [name], female   DOB: [birth year], 74 y.o.   MRN: [id number]   Drop arm bedside commode ordered through [facility].  [place], [place] [phone]

## 2019-11-19 NOTE — Consult Note (Signed)
Neuropsychological Consultation   Patient:   Chelsea [name]   DOB:   01-13-1945  MR Number:  [id number]  Location:  [facility] [address] CENTER A [address] [id number] [place] [address] Dept: [phone] Loc: [phone]           Date of Service:   11/19/2019  Start Time:   9 AM End Time:   10 AM  Provider/Observer:  Arley Phenix, Psy.D.       Clinical Neuropsychologist       Billing Code/Service: [phone]  Chief Complaint:    Chelsea [name] is a 74 year old female with history of hypertension, diabetes, left carotid stenosis, tobacco abuse and medication noncompliance.  The patient has had allergic reactions to medications in the past and is very apprehensive about various medications.  Patient lives with her daughter and has assistance as needed.  Patient presented on 11/07/2019 with intractable nausea and vomiting as well as loss of vision left peripheral field.  Elevated blood pressure was noted.  Patient had CT of head on 10/30/2019 after fall showing no evidence of acute changes.  MRI showed acute versus subacute infarct in the right hippocampus without hemorrhage.  Mild chronic ischemic changes were noted in the parietal lobe.  Reason for Service:  Patient was referred for neuropsychological consultation due to adjustment particular around residual effects of persistent nausea and dizziness and vision changes.  Below is the HPI for the current admission.  HPI: Chelsea [name]. Boghosian is a 74 year old right-handed female with history of hypertension, diabetes mellitus, left carotid stenosis 80% was scheduled for carotid enterectomy in late November with Dr. Arbie Cookey, tobacco abuse,  medical noncompliance.  Per chart review independent with assistive device prior to admission.  1 level home 4 steps to entry.  She lives with her children and assistance as needed.  Presented 11/07/2019 with intractable nausea and vomiting as well as  loss of vision left peripheral field.  Noted blood pressure 216/79.  Patient did have a CT of the head 10/30/2019 after a fall showing no evidence of acute changes.  MRI showed acute versus subacute infarct in the right hyper campus without hemorrhage.  Mild chronic ischemic changes.  CT angiogram of the head and neck ill-defined hypodensity right medial temporal lobe progressed since recent CT of 10/30/2019 most consistent with subacute infarction.  Occlusion left vertebral artery at the origin 80% focal noncalcified stenosis proximal left ICA artery.  Admission chemistries unremarkable except glucose 345 BUN 26 urine culture multiple species troponin negative hemoglobin A1c 10.1.  Echocardiogram with ejection fraction of 60 to 65% no wall motion abnormalities grade 1 diastolic dysfunction.  Maintained on aspirin and Plavix for CVA prophylaxis x3 months then Plavix alone.  Subcutaneous Lovenox for DVT prophylaxis.  Hospital course urinary retention urology consulted Foley catheter tube was placed 11/09/2019 with plans for a voiding trial as patient's mobility improved.  Tolerating a regular diet.  Therapy evaluations completed and patient was admitted for a comprehensive rehab program. Please see preadmission and consult from earlier today as well.  Current Status:  Patient was alert and oriented although somewhat drowsy during visit.  She was oriented x4 with good mental status for the most part.  Patient has some anxiety around various medications and has had significant allergic reactions to medications with near death allergic reaction in the past.  Patient has a daughter that patient lives with that provides a great deal of support and care for her.  Behavioral Observation: JAKAI ONOFRE  presents as a 74 y.o.-year-old Right Caucasian Female who appeared her stated age. her dress was Appropriate and she was Well Groomed and her manners were Appropriate to the situation.  her participation was indicative  of Appropriate and Redirectable behaviors.  There were any physical disabilities noted.  she displayed an appropriate level of cooperation and motivation.     Interactions:    Active Appropriate and Redirectable  Attention:   abnormal and attention span appeared shorter than expected for age  Memory:   within normal limits; recent and remote memory intact  Visuo-spatial:  abnormal the patient with peripheral vision loss.  Speech (Volume):  low  Speech:   normal; normal  Thought Process:  Coherent and Relevant  Though Content:  WNL; not suicidal and not homicidal  Orientation:   person, place, time/date and situation  Judgment:   Good  Planning:   Good  Affect:    Appropriate  Mood:    Anxious  Insight:   Good  Intelligence:   normal  Medical History:   Past Medical History:  Diagnosis Date  . Blind left eye   . Cardiac arrest (HCC)   . Diabetes mellitus without complication (HCC)   . Hypertension   . Stroke [facility])    Psychiatric History:  No prior psychiatric history  Family Med/Psych History:  Family History  Problem Relation Age of Onset  . Heart attack Mother   . Stroke Father    Impression/DX:  Chelsea [name] is a 74 year old female with history of hypertension, diabetes, left carotid stenosis, tobacco abuse and medication noncompliance.  The patient has had allergic reactions to medications in the past and is very apprehensive about various medications.  Patient lives with her daughter and has assistance as needed.  Patient presented on 11/07/2019 with intractable nausea and vomiting as well as loss of vision left peripheral field.  Elevated blood pressure was noted.  Patient had CT of head on 10/30/2019 after fall showing no evidence of acute changes.  MRI showed acute versus subacute infarct in the right hippocampus without hemorrhage.  Mild chronic ischemic changes were noted in the parietal lobe.  Patient was alert and oriented although somewhat drowsy during  visit.  She was oriented x4 with good mental status for the most part.  Patient has some anxiety around various medications and has had significant allergic reactions to medications with near death allergic reaction in the past.  Patient has a daughter that patient lives with that provides a great deal of support and care for her.   Disposition/Plan:  Worked on issues related to coping and adjustment following her right PCA stroke with both acute stroke events  (hippocampal involvement) as well as potential subacute/chronic parietal lobe involvement.  Diagnosis:    Acute ischemic right posterior cerebral artery (PCA) stroke (HCC) - Plan: Ambulatory referral to Neurology  Pain - Plan: DG Shoulder Left, DG Shoulder Left         Electronically Signed   _______________________ Arley Phenix, Psy.D.

## 2019-11-19 NOTE — Progress Notes (Signed)
Physical Therapy Session Note  Patient Details  Name: Chelsea [name] MRN: [id number] Date of Birth: 1945/06/26  Today's Date: 11/19/2019 PT Individual Time: 1300-1326 PT Individual Time Calculation (min): 26 min   Short Term Goals: Week 2:  PT Short Term Goal 1 (Week 2): Pt will perform bed mobility with min assist consistently PT Short Term Goal 2 (Week 2): Pt will perform bed<>WC trasnfer with min assist and LRAD PT Short Term Goal 3 (Week 2): Pt will Ambulate 81ft with mod assist and LRAD  Skilled Therapeutic Interventions/Progress Updates:     Patient in bed with her daughter upon PT arrival. Patient alert and agreeable to PT session. Patient denied pain during session, however reported intermittent nausea today.  Therapeutic Activity: Bed Mobility: Patient performed supine to/from sit with min A. Provided verbal cues for sequencing. Transfers: Patient performed sit to/from stand x1 with min A using RW. Provided verbal cues for scooting forward with max A for bringing L hip forward, HOH assist for hand placement, and facilitation for forward weight shift.  Gait Training:  Patient ambulated 24 feet performing 2 90 degree turns using RW with min-mod A until on the last turn she stopped had increased R lean and decreased motor planning to complete the turn requiring mod-max A and max cues for sequencing. Once in sitting she reported severe nausea, without emesis, emesis bag provided. Ambulated with decreased R foot clearance, decreased R stance time, and mild crouched gait with increased R knee and hip flexion compared to L. Provided verbal cues for erect posture, increased R step height, and sequencing on turns.  Educated patient and her daughter on central dizziness with symptoms of nausea from stroke. Provided strategies for reducing nausea and provided patient with crackers and diet ginger ale at end of session. RN made aware.   Patient in bed with her daughter in the room at end of  session with breaks locked, bed alarm set, and all needs within reach.    Therapy Documentation Precautions:  Precautions Precautions: Fall Restrictions Weight Bearing Restrictions: No   Therapy/Group: Individual Therapy  Hortence Charter L Brean Carberry PT, DPT  11/19/2019, 4:29 PM

## 2019-11-19 NOTE — Progress Notes (Signed)
Occupational Therapy Session Note  Patient Details  Name: Chelsea [name] MRN: [id number] Date of Birth: 1945/03/30  Today's Date: 11/19/2019 OT Individual Time: 0931-1216 OT Individual Time Calculation (min): 60 min    Short Term Goals: Week 2:  OT Short Term Goal 1 (Week 2): Patient will complete squat-pivot transfers with Mod A. OT Short Term Goal 2 (Week 2): Patient will thread BLE through LB clothing seated EOB. OT Short Term Goal 3 (Week 2): Patient will tolerate OOB activity for 1hr in prep for return to PLOF. OT Short Term Goal 4 (Week 2): Patient will demonstrate dynamic sitting balance with Min A in prep for LB BADLs.  Skilled Therapeutic Interventions/Progress Updates:  Patient met lying supine in bed in agreement with OT treatment session. 0/10 pain at rest and with activity. Supine to EOB with Min A at trunk. Sit to stand from EOB with cues for hand placement, foot placement, and walker management. Patient able to don footwear seated EOB with visual feedback from mirror and occasional Min A for to maintain dynamic sitting balance. Functional mobility to bathroom with use of RW in prep for bathing/dressing at shower level. Walk-in shower transfer with Mod A for walker management and attention to R. Patient able to doff UB clothing seated on BSC in shower with assist to doff thigh hight TED hose. UB bathing with set-up assist and LB bathing with Min A in sitting. Patient then able to don gown with good bimanual George to button. Functional mobility to sink in prep for grooming tasks in same manner as noted above. 1/3 grooming tasks in sitting with encouraged independence as patient often looks to daughter to complete tasks for her. Functional mobility to EOB and Min A for return to supine to assist RLE from EOB to bed surface. Session concluded with call bell within reach, bed alarm activated, and daughter present at bedside.   Therapy Documentation Precautions:  Precautions Precautions:  Fall Restrictions Weight Bearing Restrictions: No General:    Therapy/Group: Individual Therapy  Kaida Games R Howerton-Davis 11/19/2019, 7:27 AM

## 2019-11-19 NOTE — Progress Notes (Signed)
Occupational Therapy Session Note  Patient Details  Name: Chelsea [name] MRN: [id number] Date of Birth: 1945-04-14  Today's Date: 11/19/2019 OT Individual Time: 9417-4081 OT Individual Time Calculation (min): 45 min    Short Term Goals: Week 2:  OT Short Term Goal 1 (Week 2): Patient will complete squat-pivot transfers with Mod A. OT Short Term Goal 2 (Week 2): Patient will thread BLE through LB clothing seated EOB. OT Short Term Goal 3 (Week 2): Patient will tolerate OOB activity for 1hr in prep for return to PLOF. OT Short Term Goal 4 (Week 2): Patient will demonstrate dynamic sitting balance with Min A in prep for LB BADLs.  Skilled Therapeutic Interventions/Progress Updates:    Treatment session with focus on sitting balance, Rt attention, visual scanning and attention, and overall activity tolerance.  Pt received supine in bed reporting "worn out" from previous therapy sessions, however with increased time and encouragement pt agreeable to activity seated at EOB.  Pt completed bed mobility with CGA to come to sitting EOB.  Pt's daughter present during session, reporting pt with inconsistencies when engaging in self-feeding.  Engaged in visual scanning for items on table top to assess double vision and visual attention.  Pt demonstrating decreased peripheral visual attention and often reaching for items with RUE when challenged to only visually scan.  Pt reports no double vision at this time.  Discussed use of patch only when double vision present and reiterated switching patched eye every 2 hours.  Engaged in simulated feeding task with scooping beads with spoon and placing in cups in various places to challenge vision.  Pt with overshooting to Rt ~20% of time. Pt also overfilling spoon despite cues to fill less full.  Pt demonstrating some increased impulsivity with scooping and decreased attention to task.  Recommended increased upright posture, lights on, and decreased stimulus during  self-feeding to increase success.  Pt returned to supine in bed with mod assist due to increased anxiety over getting sick.  Pt reports nausea, but no vomiting.    Therapy Documentation Precautions:  Precautions Precautions: Fall Restrictions Weight Bearing Restrictions: No General:   Vital Signs: Therapy Vitals Temp: 97.6 F (36.4 C) Pulse Rate: (!) 54 Resp: 17 BP: (!) 151/70 Patient Position (if appropriate): Sitting Oxygen Therapy SpO2: 100 % O2 Device: Room Air Pain:  Pt with no c/o pain   Therapy/Group: Individual Therapy  Rosalio Loud 11/19/2019, 3:23 PM

## 2019-11-19 NOTE — Progress Notes (Signed)
Physical Therapy Session Note  Patient Details  Name: Chelsea [name] MRN: [id number] Date of Birth: Jan 29, 1945  Today's Date: 11/19/2019 PT Individual Time: 0803-0900 PT Individual Time Calculation (min): 57 min   Short Term Goals: Week 2:  PT Short Term Goal 1 (Week 2): Pt will perform bed mobility with min assist consistently PT Short Term Goal 2 (Week 2): Pt will perform bed<>WC trasnfer with min assist and LRAD PT Short Term Goal 3 (Week 2): Pt will Ambulate 89f with mod assist and LRAD  Skilled Therapeutic Interventions/Progress Updates: Pt presented in bed with nsg present agreeable to therapy. Pt received am meds bed level from nsg. Pt states decreased pain in L shoulder not sure if due to patch or Kpack. Pt's dgt, SGabriel Cirripresent and had questions regarding DME to which PTA answered queries to dgt's satisfaction. Pt then performed supine to sit with CGA and increased time with heavy sure of bed features. Pt noted to be able to correct midline without cues at that time. Pt then performed stand pivot to RW with minA and mod verbal cues for sequencing and step length as well as management with RW. SGabriel Cirrithen brushed pt's hair and expressed desire for pt's "leg to get stronger". PTA provided education to dgt that pt's has functional strength in RLE however is more limited in attention and awareness. PTA provided education to allow pt to do as much as she can I.e. repositioning in bed to allow pt's to improve vs doing it for her. Dgt hesitant but verbalized understanding. Pt then transported to rehab gym and pt performed stand pivot transfer to mat with modA due to pt rushing transfer as well as not following PTA's commands. Pt then participated in sitting and reaching task placing horseshoes on basketball net with RUE. Pt was able to reach to L, forward, and low with moderate challenges, and to R with minimal challenges however pt was able to return to midline without cues. Pt then performed same  activity in standing with pt requring minA nearing CGA to stand from EOM and was able to maintain midline without cues while placing horseshoes on raised hoop. Pt then performed stand pivot transfer to w/c with minA overall and transported back to room. Upon arrival in room pt performed another stand pivot to recliner with minA overall. Pt left in recliner at end of session with seat alarm on, call bell within reach and current needs met.      Therapy Documentation Precautions:  Precautions Precautions: Fall Restrictions Weight Bearing Restrictions: No   Therapy/Group: Individual Therapy  Travez Stancil  Kassondra Geil, PTA  11/19/2019, 12:22 PM

## 2019-11-19 NOTE — Progress Notes (Signed)
[facility] PHYSICAL MEDICINE & REHABILITATION PROGRESS NOTE   Subjective/Complaints:  Left shoulder , skin itchy, Lidoderm started over the weekend , had itching  ROS: Patient denies SOB,negative abd pain, +bowel incont  Objective:   DG Shoulder Left  Result Date: 11/17/2019 CLINICAL DATA:  Left shoulder neuropathic pain.  No reported injury. EXAM: LEFT SHOULDER - 2+ VIEW COMPARISON:  None. FINDINGS: No fracture. No glenohumeral dislocation. No evidence of acromioclavicular separation. No suspicious focal osseous lesions. No significant arthropathy. Tiny soft tissue calcifications adjacent to the posterolateral margin of the greater tuberosity. No radiopaque foreign bodies. IMPRESSION: Tiny soft tissue calcifications adjacent to the posterolateral margin of the greater tuberosity, suggesting calcific tendinopathy/bursitis. No acute osseous abnormality. Electronically Signed   By: Delbert Phenix M.D.   On: 11/17/2019 17:07   No results for input(s): WBC, HGB, HCT, PLT in the last 72 hours. No results for input(s): NA, K, CL, CO2, GLUCOSE, BUN, CREATININE, CALCIUM in the last 72 hours.  Intake/Output Summary (Last 24 hours) at 11/19/2019 0942 Last data filed at 11/19/2019 0745 Gross per 24 hour  Intake 300 ml  Output 2000 ml  Net -1700 ml        Physical Exam: Vital Signs Blood pressure (!) 164/66, pulse (!) 59, temperature 98.3 F (36.8 C), temperature source Oral, resp. rate 18, height 5\' 7"  (1.702 m), weight 64.8 kg, SpO2 95 %.   General: No acute distress Mood and affect are appropriate Heart: Regular rate and rhythm no rubs murmurs or extra sounds Lungs: Clear to auscultation, breathing unlabored, no rales or wheezes Abdomen: Positive bowel sounds, soft nontender to palpation, nondistended Extremities: No clubbing, cyanosis, or edema Skin: No evidence of breakdown, no evidence of rash  Neuro: eyes opened. Follows commands. Initiates conversation and demonstrates fair insight  and awareness. Poor visual acuity/depth perception when eyes are open. 4/5 RUE, LUE, LLE, RLE  Senses LT in all 4's.  Musculoskeletal:mild shoulder pain mainly at night    Assessment/Plan: 1. Functional deficits secondary to right PCA infarct which require 3+ hours per day of interdisciplinary therapy in a comprehensive inpatient rehab setting.  Physiatrist is providing close team supervision and 24 hour management of active medical problems listed below.  Physiatrist and rehab team continue to assess barriers to discharge/monitor patient progress toward functional and medical goals  Care Tool:  Bathing    Body parts bathed by patient: Chest, Abdomen, Face   Body parts bathed by helper: Right arm, Left arm, Front perineal area, Right upper leg, Buttocks, Left upper leg, Right lower leg, Left lower leg     Bathing assist Assist Level: 2 Helpers     Upper Body Dressing/Undressing Upper body dressing        Upper body assist Assist Level: Total Assistance - Patient < 25%    Lower Body Dressing/Undressing Lower body dressing      What is the patient wearing?: Incontinence brief     Lower body assist Assist for lower body dressing: Moderate Assistance - Patient 50 - 74%     Toileting Toileting    Toileting assist Assist for toileting: 2 Helpers     Transfers Chair/bed transfer  Transfers assist  Chair/bed transfer activity did not occur: Safety/medical concerns  Chair/bed transfer assist level: Minimal Assistance - Patient > 75%     Locomotion Ambulation   Ambulation assist   Ambulation activity did not occur: Safety/medical concerns  Assist level: Minimal Assistance - Patient > 75% Assistive device: Walker-rolling Max distance: 90 ft  Walk 10 feet activity   Assist  Walk 10 feet activity did not occur: Safety/medical concerns  Assist level: Minimal Assistance - Patient > 75% Assistive device: Walker-rolling   Walk 50 feet activity   Assist  Walk 50 feet with 2 turns activity did not occur: Safety/medical concerns  Assist level: Minimal Assistance - Patient > 75% Assistive device: Walker-rolling    Walk 150 feet activity   Assist Walk 150 feet activity did not occur: Safety/medical concerns         Walk 10 feet on uneven surface  activity   Assist Walk 10 feet on uneven surfaces activity did not occur: Safety/medical concerns         Wheelchair     Assist Will patient use wheelchair at discharge?: Yes Type of Wheelchair: Manual Wheelchair activity did not occur: Safety/medical concerns (pt unable to participate in [facility] assessment this date 2/2 severe nausea and vomiting and unable to transfer OOB at this time.)         Wheelchair 50 feet with 2 turns activity    Assist    Wheelchair 50 feet with 2 turns activity did not occur: Safety/medical concerns       Wheelchair 150 feet activity     Assist  Wheelchair 150 feet activity did not occur: Safety/medical concerns       Blood pressure (!) 164/66, pulse (!) 59, temperature 98.3 F (36.8 C), temperature source Oral, resp. rate 18, height 5\' 7"  (1.702 m), weight 64.8 kg, SpO2 95 %.  Medical Problem List and Plan: 1.  Decreased functional mobility with falls loss of vision left peripheral field secondary to right PCA infarction in the setting of left vertebral artery occlusion along with severe left ICA stenosis with previously silent subacute left parietal infarct secondary to large vessel disease.             -patient may shower             -ELOS/Goals: 12-16 days/Min/Mod A         -Continue CIR 2.  Antithrombotics: -DVT/anticoagulation: Lovenox             -antiplatelet therapy: Aspirin 81 mg daily and Plavix 25 mg daily x3 months then Plavix alone 3. Pain Management: Lyrica 25 mg daily, Hx post herpetic neuralgia, skin rxn to  lidocaine patch again will d/c, increase lyrica to 100 mg.    discussed calcific tendinopathy XR results with  pt and daughte r- pt without movement related pain so xray finding are not cause of symptoms 4. Mood: Provide emotional support, family supportive             -antipsychotic agents: N/A 5. Neuropsych: This patient is capable of making decisions on her own behalf. 6. Skin/Wound Care: Routine skin checks 7. Fluids/Electrolytes/Nutrition: intake poor to inconsistent d/t nausea  -encouraged her to push po as much as she can. 8.  Hypertension.  Coreg 12.5 mg twice daily.  Monitor with increased mobility.    Vitals:   11/18/19 1953 11/19/19 0525  BP: (!) 161/59 (!) 164/66  Pulse: (!) 59 (!) 59  Resp: 16 18  Temp: 98.5 F (36.9 C) 98.3 F (36.8 C)  SpO2: 97% 95%  labile systolic BP, will check orthostatic vitals HR on low side would not increase coreg   11/7: BP still a little high. Add amlodipine 5mg . Decrease Coreg to 6.25mg  BID as HR is low.   9.  Diabetes mellitus peripheral neuropathy.  Hemoglobin A1c 10.1.  SSI.  Patient on Glucophage 500 mg twice daily prior to admission.  Resume as needed             CBG (last 3)  Recent Labs    11/18/19 1633 11/18/19 2048 11/19/19 0554  GLUCAP 139* 130* 97  pm elevation ,start am metformin  11/4: CBG elevated to 234 last night. Increase Metformin to BID-cont to monitor on this dose  CBGs improving  10.  Hyperlipidemia.  Lipitor 12.  Tobacco abuse.  Counseling 13.  Vestibular sx: added scopolamine patch with some improvement. Less limited  -scheduled zofran   -acclimation, OOB  -11/2 spoke to pt/daughter.discussed favorable prognosis for improvement of vestibular sx but that it may take several weeks   14.  Urinary retention still +2 total xfer will do voiding trial when trasnfers improve  LOS: 10 days A FACE TO FACE EVALUATION WAS PERFORMED  Erick Colace 11/19/2019, 9:42 AM

## 2019-11-20 ENCOUNTER — Inpatient Hospital Stay (HOSPITAL_COMMUNITY): Payer: PPO | Admitting: Physical Therapy

## 2019-11-20 ENCOUNTER — Inpatient Hospital Stay (HOSPITAL_COMMUNITY): Payer: PPO | Admitting: Occupational Therapy

## 2019-11-20 LAB — GLUCOSE, CAPILLARY
Glucose-Capillary: 122 mg/dL — ABNORMAL HIGH (ref 70–99)
Glucose-Capillary: 131 mg/dL — ABNORMAL HIGH (ref 70–99)
Glucose-Capillary: 105 mg/dL — ABNORMAL HIGH (ref 70–99)
Glucose-Capillary: 92 mg/dL (ref 70–99)

## 2019-11-20 MED ORDER — AMLODIPINE BESYLATE 5 MG PO TABS
5.0000 mg | ORAL_TABLET | Freq: Every day | ORAL | Status: DC
  Administered 2019-11-20 – 2019-11-22 (×3): 5 mg via ORAL
  Filled 2019-11-20 (×3): qty 1

## 2019-11-20 MED ORDER — PREGABALIN 50 MG PO CAPS
100.0000 mg | ORAL_CAPSULE | Freq: Every evening | ORAL | Status: DC
  Administered 2019-11-20 – 2019-12-02 (×13): 100 mg via ORAL
  Filled 2019-11-20 (×13): qty 2

## 2019-11-20 MED ORDER — CARVEDILOL 3.125 MG PO TABS
3.1250 mg | ORAL_TABLET | Freq: Two times a day (BID) | ORAL | Status: DC
  Administered 2019-11-20 – 2019-11-22 (×4): 3.125 mg via ORAL
  Filled 2019-11-20 (×4): qty 1

## 2019-11-20 NOTE — Progress Notes (Signed)
Patient ID: Chelsea [name], female   DOB: 1945-09-23, 74 y.o.   MRN: [id number] Notified of patient's admission.  Complicated issues. I saw the patient in outpatient consultation on 11/05/2019. She was found to have severe left carotid stenosis by duplex. She did not have any focal weakness but did have some changes in her left eye. She had extreme hypertension in the 220/120 range and was recommended that she work on better control prior to endarterectomy. She was then admitted with severe nausea and vomiting and hyperglycemia. Further evaluation revealed potential left visual field changes and also difficulty with her gait. MRI revealed right posterior circulation stroke. She was discharged from inpatient treatment and was admitted to [facility] rehab.  I visited with her today and her daughter is present as well. I again explained the need for eventual left carotid endarterectomy and explained that the optimal timing of this is hard to determine. Assuming she continues to make progress, I do feel that it would be safe to proceed with left carotid endarterectomy prior to discharge to home when she has reached maximal benefit from inpatient rehab. We will follow along peripherally as she continues to improve

## 2019-11-20 NOTE — Progress Notes (Addendum)
[facility] PHYSICAL MEDICINE & REHABILITATION PROGRESS NOTE   Subjective/Complaints:  Patient is without new issues overnight.  Daughter at bedside.  No significant pain complaints other than left shoulder.  We discussed the neuropathic pain and types of medications we use for this.  ROS: Patient denies SOB,negative abd pain, +bowel incont, last BM 11/5  Objective:   No results found. No results for input(s): WBC, HGB, HCT, PLT in the last 72 hours. No results for input(s): NA, K, CL, CO2, GLUCOSE, BUN, CREATININE, CALCIUM in the last 72 hours.  Intake/Output Summary (Last 24 hours) at 11/20/2019 0839 Last data filed at 11/20/2019 0757 Gross per 24 hour  Intake 900 ml  Output 2100 ml  Net -1200 ml        Physical Exam: Vital Signs Blood pressure (!) 151/59, pulse (!) 55, temperature 98 F (36.7 C), temperature source Oral, resp. rate 18, height 5\' 7"  (1.702 m), weight 64.8 kg, SpO2 94 %.   General: No acute distress Mood and affect are appropriate Heart: Regular rate and rhythm no rubs murmurs or extra sounds Lungs: Clear to auscultation, breathing unlabored, no rales or wheezes Abdomen: Positive bowel sounds, soft nontender to palpation, nondistended Extremities: No clubbing, cyanosis, or edema Skin: No evidence of breakdown, no evidence of rash  Neuro: eyes opened. Follows commands. Initiates conversation and demonstrates fair insight and awareness. Poor visual acuity/depth perception when eyes are open. 4/5 RUE, LUE, LLE, RLE  Senses LT in all 4's.  Musculoskeletal:mild shoulder pain mainly at night    Assessment/Plan: 1. Functional deficits secondary to right PCA infarct which require 3+ hours per day of interdisciplinary therapy in a comprehensive inpatient rehab setting.  Physiatrist is providing close team supervision and 24 hour management of active medical problems listed below.  Physiatrist and rehab team continue to assess barriers to discharge/monitor  patient progress toward functional and medical goals  Care Tool:  Bathing    Body parts bathed by patient: Right arm, Left arm, Chest, Abdomen, Front perineal area, Buttocks, Right upper leg, Left upper leg, Right lower leg, Left lower leg, Face   Body parts bathed by helper: Right arm, Left arm, Front perineal area, Right upper leg, Buttocks, Left upper leg, Right lower leg, Left lower leg     Bathing assist Assist Level: Minimal Assistance - Patient > 75%     Upper Body Dressing/Undressing Upper body dressing   What is the patient wearing?: Button up shirt    Upper body assist Assist Level: Minimal Assistance - Patient > 75%    Lower Body Dressing/Undressing Lower body dressing      What is the patient wearing?: Incontinence brief     Lower body assist Assist for lower body dressing: Moderate Assistance - Patient 50 - 74%     Toileting Toileting    Toileting assist Assist for toileting: Moderate Assistance - Patient 50 - 74%     Transfers Chair/bed transfer  Transfers assist  Chair/bed transfer activity did not occur: Safety/medical concerns  Chair/bed transfer assist level: Minimal Assistance - Patient > 75%     Locomotion Ambulation   Ambulation assist   Ambulation activity did not occur: Safety/medical concerns  Assist level: Minimal Assistance - Patient > 75% Assistive device: Walker-rolling Max distance: 90 ft   Walk 10 feet activity   Assist  Walk 10 feet activity did not occur: Safety/medical concerns  Assist level: Minimal Assistance - Patient > 75% Assistive device: Walker-rolling   Walk 50 feet activity  Assist Walk 50 feet with 2 turns activity did not occur: Safety/medical concerns  Assist level: Minimal Assistance - Patient > 75% Assistive device: Walker-rolling    Walk 150 feet activity   Assist Walk 150 feet activity did not occur: Safety/medical concerns         Walk 10 feet on uneven surface  activity   Assist  Walk 10 feet on uneven surfaces activity did not occur: Safety/medical concerns         Wheelchair     Assist Will patient use wheelchair at discharge?: Yes Type of Wheelchair: Manual Wheelchair activity did not occur: Safety/medical concerns (pt unable to participate in [facility] assessment this date 2/2 severe nausea and vomiting and unable to transfer OOB at this time.)         Wheelchair 50 feet with 2 turns activity    Assist    Wheelchair 50 feet with 2 turns activity did not occur: Safety/medical concerns       Wheelchair 150 feet activity     Assist  Wheelchair 150 feet activity did not occur: Safety/medical concerns       Blood pressure (!) 151/59, pulse (!) 55, temperature 98 F (36.7 C), temperature source Oral, resp. rate 18, height 5\' 7"  (1.702 m), weight 64.8 kg, SpO2 94 %.  Medical Problem List and Plan: 1.  Decreased functional mobility with falls loss of vision left peripheral field secondary to right PCA infarction in the setting of left vertebral artery occlusion along with severe left ICA stenosis with previously silent subacute left parietal infarct secondary to large vessel disease.             -patient may shower             -ELOS/Goals: 12-16 days/Min/Mod A         -Team conference in a.m. 2.  Antithrombotics: -DVT/anticoagulation: Lovenox             -antiplatelet therapy: Aspirin 81 mg daily and Plavix 25 mg daily x3 months then Plavix alone 3. Pain Management: Lyrica 25 mg daily, Hx post herpetic neuralgia, skin rxn to  lidocaine patch again will d/c, increase lyrica to 100 mg.    discussed calcific tendinopathy XR results with pt and daughte r- pt without movement related pain so xray finding are not cause of symptoms 4. Mood: Provide emotional support, family supportive             -antipsychotic agents: N/A 5. Neuropsych: This patient is capable of making decisions on her own behalf. 6. Skin/Wound Care: Routine skin checks 7.  Fluids/Electrolytes/Nutrition: intake poor to inconsistent d/t nausea  -encouraged her to push po as much as she can. 8.  Hypertension.  Coreg 12.5 mg twice daily.  Monitor with increased mobility.    Vitals:   11/19/19 2000 11/20/19 0611  BP: (!) 159/62 (!) 151/59  Pulse: (!) 55 (!) 55  Resp: 18 18  Temp: 98 F (36.7 C) 98 F (36.7 C)  SpO2: 96% 94%  labile systolic BP, will check orthostatic vitals HR on low side would not increase coreg   Start  amlodipine 5mg . Decrease Coreg to 3.125mg  BID as HR is low.   9.  Diabetes mellitus peripheral neuropathy.  Hemoglobin A1c 10.1.  SSI.  Patient on Glucophage 500 mg twice daily prior to admission.  Resume as needed             CBG (last 3)  Recent Labs    11/19/19 1648 11/19/19  2048 11/20/19 0611  GLUCAP 140* 169* 122*  pm elevation ,start am metformin  11/4: CBG elevated to 234 last night. Increase Metformin to BID-cont to monitor on this dose  CBGs improving  10.  Hyperlipidemia.  Lipitor 12.  Tobacco abuse.  Counseling 13.  Vestibular sx: added scopolamine patch with some improvement. Less limited  -scheduled zofran   -acclimation, OOB  -11/2 spoke to pt/daughter.discussed favorable prognosis for improvement of vestibular sx but that it may take several weeks   14.  Urinary retention still +2 total xfer will do voiding trial when transfers improve  LOS: 11 days A FACE TO FACE EVALUATION WAS PERFORMED  Erick Colace 11/20/2019, 8:39 AM

## 2019-11-20 NOTE — Progress Notes (Signed)
Physical Therapy Session Note  Patient Details  Name: Chelsea [name] MRN: [id number] Date of Birth: [birth year]  Today's Date: 11/20/2019 PT Individual Time: [phone] and  1350-1500 PT Individual Time Calculation (min):  60 min and 70 min   Short Term Goals: Week 2:  PT Short Term Goal 1 (Week 2): Pt will perform bed mobility with min assist consistently PT Short Term Goal 2 (Week 2): Pt will perform bed<>WC trasnfer with min assist and LRAD PT Short Term Goal 3 (Week 2): Pt will Ambulate 67f with mod assist and LRAD  Skilled Therapeutic Interventions/Progress Updates: Tx1: Pt presented in bed sleeping but easily aroused and dgt [name] present in room. Pt indicated increased pain in L shoulder due to pt have reaction to Lidocaine patch and unable to use it any further. Rest breaks provided as needed for pain management. Pt performed supine to sit at EOB with CGA and increased time with use of bed features. Pt was able to scoot to EOB with verbal cues only. Pt sat at EOB x 4 min  And dgt donned additional gown and brushed hair. Pt then performed stand pivot transfer to R with minA and mod verbal cues for pacing and sequencing. Pt then transported to day room and performed stand pivot transfer to mat with minA. Pt then participated in clothespin activity for forced use of RUE and static balance as well as standing tolerance. Pt required mod verbal cues to stabilize feet as pt kept on attempting to step towards tray vs reaching with UE as well as becoming more distracted. During activity pt had to be redirected to task as well as cued for safety. When pt followed cues pt was able to maintain good static balance and fair stability of RLE. When distracted increased knee instability noted. Pt required extended seated rest due to fatigue after activity. PTA noted that pt's brief was loose when sat back down therefore performed additional STS and pt was able to maintain fair static balance as PTA adjusted  brief. Pt with brief seated rest then performed stand pivot back to w/c with minA. Pt then participated in w/c mobility ~1247fback to room with supervision and intermittent verbal cues for direction. Once back in room pt performed stand pivot transfer to L in same manner as prior. Pt required minA for sit to supine and was able to reposition to comfort with verbal cues only. Pt left in bed at end of session with bed alarm on, call bell within reach and needs met.   Tx2: Pt presented in bed sleeping but easily aroused and agreeable to therapy, pt's dgt LiLattie Hawresent in room. Pt denies pain at rest and did not indicate any L shoulder pain during session. Pt performed bed mobility with CGA and use of bed features. Pt noted to have R lean initially upon sitting however was able to correct without cues. Pt indicated episode of emesis after OT session and did not want to eat lunch (lunch tray was untouched on tray). Pt performed stand pivot transfer from bed with minA and verbal cues to gain balance prior to initiating pivot. Pt then performed stand pivot transfer with minA and verbal cues for sequencing. Pt transported to rehab gym and performed stand pivot to mat to R in same manner as prior. Pt then participated in gait training 2092f 2 with regular RW and minA nearing CGA. Pt required verbal cues to maintain close proximity to RW as well as maintaining knee extension in stance  phase with RLE. Pt also noted to have increased R lateral lean from trunk when advancing LLE. When pt returned to mat pt participated in ball toss in sitting for dynamics sitting balance then performed STS with CGA and performed ball taps with RUE for forced use and standing tolerance x 30. Pt noted to maintain midline and perform adequate reaching crossing midline and reaching to R without increased R lateral lean nor demonstrating R knee instability. Pt also participated in toe taps to 4in step with pt demonstrating decreased R lateral lean  x 10 ea bilaterally then x 10 alternating. Participated in additional gait training with RW x 68 ft with pt demonstrating improved posture and close proximity to RW. Pt initially demonstrating good stride length and decreased R lateral lean however more noted with fatigue. Pt transported back to room at end of session and agreeable to remain seated in w/c and attempt to eat some Jello. Pt left in w/c with dgt Lattie Haw present, call bell within reach and needs met.      Therapy Documentation Precautions:  Precautions Precautions: Fall Restrictions Weight Bearing Restrictions: No General:   Vital Signs: Therapy Vitals Temp: 98 F (36.7 C) Pulse Rate: (!) 55 Resp: 16 BP: (!) 172/59 Patient Position (if appropriate): Lying Oxygen Therapy SpO2: 98 % O2 Device: Room Air    Therapy/Group: Individual Therapy  Aseel Truxillo  Veanna Dower, PTA  11/20/2019, 3:27 PM

## 2019-11-20 NOTE — Progress Notes (Signed)
Occupational Therapy Session Note  Patient Details  Name: Chelsea [name] MRN: [id number] Date of Birth: [birth year]  Today's Date: 11/20/2019 OT Individual Time: 1102-1202 OT Individual Time Calculation (min): 60 min    Short Term Goals: Week 2:  OT Short Term Goal 1 (Week 2): Patient will complete squat-pivot transfers with Mod A. OT Short Term Goal 2 (Week 2): Patient will thread BLE through LB clothing seated EOB. OT Short Term Goal 3 (Week 2): Patient will tolerate OOB activity for 1hr in prep for return to PLOF. OT Short Term Goal 4 (Week 2): Patient will demonstrate dynamic sitting balance with Min A in prep for LB BADLs.  Skilled Therapeutic Interventions/Progress Updates:    Treatment session with focus on dynamic sitting balance, standing balance, transfers, and visual attention. Pt received supine in bed reporting fatigue but agreeable to therapy session.  Pt completed bed mobility with CGA and min cues for sequencing and trunk control when seated EOB.  Completed stand pivot transfer to w/c with RW with mod assist due to heavy lean to Rt, utilized daughter in front for visual feedback of midline. Engaged in [employer] in sitting with focus on visual scanning and sustained attention to task.  Pt initially demonstrated decreased sustained attention to task requiring frequent redirection to visually scan board for lights.  Pt demonstrating difficulty sustaining attention >20-30 seconds at a time, initially.  Pt demonstrating reaction time 5.71 seconds during first trial, 5.0 seconds on second trial, and improved to 3.43 seconds on 3rd trial.  Pt demonstrating decreased trunk stability during visual scanning.  Pt with no c/o dizziness during session.  Pt returned to room and requested to transfer back to bed due to fatigue.  Completed stand pivot transfer to bed with min assist.  Pt able to scoot along edge of bed with min assist and cues for weight shifting.  Pt returned to semi-reclined and  left with all needs in reach.    Therapy Documentation Precautions:  Precautions Precautions: Fall Restrictions Weight Bearing Restrictions: No General:   Vital Signs: Therapy Vitals Temp: 98 F (36.7 C) Pulse Rate: (!) 55 Resp: 16 BP: (!) 172/59 Patient Position (if appropriate): Lying Oxygen Therapy SpO2: 98 % O2 Device: Room Air Pain:  Pt with no c/o pain   Therapy/Group: Individual Therapy  Rosalio Loud 11/20/2019, 3:22 PM

## 2019-11-21 ENCOUNTER — Inpatient Hospital Stay (HOSPITAL_COMMUNITY): Payer: PPO | Admitting: Physical Therapy

## 2019-11-21 ENCOUNTER — Inpatient Hospital Stay (HOSPITAL_COMMUNITY): Payer: PPO | Admitting: Occupational Therapy

## 2019-11-21 DIAGNOSIS — I639 Cerebral infarction, unspecified: Secondary | ICD-10-CM | POA: Diagnosis present

## 2019-11-21 LAB — GLUCOSE, CAPILLARY
Glucose-Capillary: 104 mg/dL — ABNORMAL HIGH (ref 70–99)
Glucose-Capillary: 183 mg/dL — ABNORMAL HIGH (ref 70–99)
Glucose-Capillary: 80 mg/dL (ref 70–99)
Glucose-Capillary: 175 mg/dL — ABNORMAL HIGH (ref 70–99)

## 2019-11-21 MED ORDER — POLYETHYLENE GLYCOL 3350 17 G PO PACK
17.0000 g | PACK | Freq: Every day | ORAL | Status: DC
  Administered 2019-11-22 – 2019-12-01 (×10): 17 g via ORAL
  Filled 2019-11-21 (×11): qty 1

## 2019-11-21 MED ORDER — SORBITOL 70 % SOLN
30.0000 mL | Freq: Once | Status: AC
  Administered 2019-11-22: 30 mL via ORAL
  Filled 2019-11-21: qty 30

## 2019-11-21 NOTE — Progress Notes (Signed)
Nutrition Follow-up  RD working remotely.  DOCUMENTATION CODES:   Not applicable  INTERVENTION:   - Continue Boost Breeze po TID, each supplement provides 250 kcal and 9 grams of protein  - Continue MVI with minerals daily  - Encourage adequate PO intake  NUTRITION DIAGNOSIS:   Inadequate oral intake related to nausea, vomiting as evidenced by meal completion < 50%.  Progressing  GOAL:   Patient will meet greater than or equal to 90% of their needs  Progressing  MONITOR:   PO intake, Supplement acceptance, Labs, Weight trends, Skin, I & O's  REASON FOR ASSESSMENT:   Malnutrition Screening Tool    ASSESSMENT:   Chelsea [name] is a 74 year old right-handed female with history of hypertension, diabetes mellitus, left carotid stenosis 80% was scheduled for carotid enterectomy in late November with Dr. Arbie Cookey, tobacco abuse,  medical noncompliance.  Per chart review independent with assistive device prior to admission.  Noted target d/c date of 10/23.  RD attempted to speak with pt via phone call to room; however, no answer.  PO intake has remained variable, likely related to nausea per RN documentation. Pt's weight is stable. Pt accepting most Boost Breeze supplements. Will continue with current supplement regimen.  CIR admit weight: 62 kg Current weight: 64 kg  Meal Completion: 0-100% (variable, averaging 72%)  Medications reviewed and include: Boost Breeze TID, SSI, metformin, MVI with minerals, zofran 4 mg q 12 hours, scopolamine patch  Labs reviewed. CBG's: 92-183 x 24 hours  UOP: 1650 ml x 24 hours I/O's: -7.7 L since admit  Diet Order:   Diet Order            Diet regular Room service appropriate? Yes; Fluid consistency: Thin  Diet effective now                 EDUCATION NEEDS:   No education needs have been identified at this time  Skin:  Skin Assessment: Reviewed RN Assessment  Last BM:  11/16/19  Height:   Ht Readings from Last 1  Encounters:  11/13/19 5\' 7"  (1.702 m)    Weight:   Wt Readings from Last 1 Encounters:  11/21/19 64 kg    Ideal Body Weight:  61.4 kg  BMI:  Body mass index is 22.1 kg/m.  Estimated Nutritional Needs:   Kcal:  1850-2050  Protein:  95-110 grams  Fluid:  > 1.8 L    13/10/21, MS, RD, LDN Inpatient Clinical Dietitian Please see [facility] for contact information.

## 2019-11-21 NOTE — Progress Notes (Signed)
[place] PHYSICAL MEDICINE & REHABILITATION PROGRESS NOTE   Subjective/Complaints: Seen in gym, discussed with PT, reaching activities to Left side Appreciate VVS note, planning L CEA immediately after rehab  ROS: Patient denies SOB,negative abd pain, +bowel incont, last BM 11/9  Objective:   No results found. No results for input(s): WBC, HGB, HCT, PLT in the last 72 hours. No results for input(s): NA, K, CL, CO2, GLUCOSE, BUN, CREATININE, CALCIUM in the last 72 hours.  Intake/Output Summary (Last 24 hours) at 11/21/2019 0901 Last data filed at 11/21/2019 0752 Gross per 24 hour  Intake 560 ml  Output 1650 ml  Net -1090 ml        Physical Exam: Vital Signs Blood pressure (!) 144/62, pulse (!) 57, temperature 98.5 F (36.9 C), temperature source Oral, resp. rate 16, height 5' 7"  (1.702 m), weight 64 kg, SpO2 92 %.   General: No acute distress Mood and affect are appropriate Heart: Regular rate and rhythm no rubs murmurs or extra sounds Lungs: Clear to auscultation, breathing unlabored, no rales or wheezes Abdomen: Positive bowel sounds, soft nontender to palpation, nondistended Extremities: No clubbing, cyanosis, or edema Skin: No evidence of breakdown, no evidence of rash  Neuro: eyes opened. Follows commands. Initiates conversation and demonstrates fair insight and awareness. Poor visual acuity/depth perception when eyes are open. 4/5 RUE, LUE, LLE, RLE  Senses LT in all 4's.  Musculoskeletal:mild shoulder pain mainly at night    Assessment/Plan: 1. Functional deficits secondary to right PCA infarct which require 3+ hours per day of interdisciplinary therapy in a comprehensive inpatient rehab setting. Physiatrist is providing close team supervision and 24 hour management of active medical problems listed below. Physiatrist and rehab team continue to assess barriers to discharge/monitor patient progress toward functional and medical goals  Care Tool:  Bathing     Body parts bathed by patient: Right arm, Left arm, Chest, Abdomen, Front perineal area, Buttocks, Right upper leg, Left upper leg, Right lower leg, Left lower leg, Face   Body parts bathed by helper: Right arm, Left arm, Front perineal area, Right upper leg, Buttocks, Left upper leg, Right lower leg, Left lower leg     Bathing assist Assist Level: Minimal Assistance - Patient > 75%     Upper Body Dressing/Undressing Upper body dressing   What is the patient wearing?: Button up shirt    Upper body assist Assist Level: Minimal Assistance - Patient > 75%    Lower Body Dressing/Undressing Lower body dressing      What is the patient wearing?: Incontinence brief     Lower body assist Assist for lower body dressing: Moderate Assistance - Patient 50 - 74%     Toileting Toileting    Toileting assist Assist for toileting: Moderate Assistance - Patient 50 - 74%     Transfers Chair/bed transfer  Transfers assist  Chair/bed transfer activity did not occur: Safety/medical concerns  Chair/bed transfer assist level: Minimal Assistance - Patient > 75%     Locomotion Ambulation   Ambulation assist   Ambulation activity did not occur: Safety/medical concerns  Assist level: Minimal Assistance - Patient > 75% Assistive device: Walker-rolling Max distance: 90 ft   Walk 10 feet activity   Assist  Walk 10 feet activity did not occur: Safety/medical concerns  Assist level: Minimal Assistance - Patient > 75% Assistive device: Walker-rolling   Walk 50 feet activity   Assist Walk 50 feet with 2 turns activity did not occur: Safety/medical concerns  Assist level:  Minimal Assistance - Patient > 75% Assistive device: Walker-rolling    Walk 150 feet activity   Assist Walk 150 feet activity did not occur: Safety/medical concerns         Walk 10 feet on uneven surface  activity   Assist Walk 10 feet on uneven surfaces activity did not occur: Safety/medical  concerns         Wheelchair     Assist Will patient use wheelchair at discharge?: Yes Type of Wheelchair: Manual Wheelchair activity did not occur: Safety/medical concerns (pt unable to participate in [facility] assessment this date 2/2 severe nausea and vomiting and unable to transfer OOB at this time.)         Wheelchair 50 feet with 2 turns activity    Assist    Wheelchair 50 feet with 2 turns activity did not occur: Safety/medical concerns       Wheelchair 150 feet activity     Assist  Wheelchair 150 feet activity did not occur: Safety/medical concerns       Blood pressure (!) 144/62, pulse (!) 57, temperature 98.5 F (36.9 C), temperature source Oral, resp. rate 16, height 5' 7"  (1.702 m), weight 64 kg, SpO2 92 %.  Medical Problem List and Plan: 1.  Decreased functional mobility with falls loss of vision left peripheral field secondary to right PCA infarction in the setting of left vertebral artery occlusion along with severe left ICA stenosis with previously silent subacute left parietal infarct secondary to large vessel disease.             -patient may shower             -ELOS/Goals: 12-16 days/Min/Mod A     Team conference today please see physician documentation under team conference tab, met with team  to discuss problems,progress, and goals. Formulized individual treatment plan based on medical history, underlying problem and comorbidities.  2.  Antithrombotics: -DVT/anticoagulation: Lovenox             -antiplatelet therapy: Aspirin 81 mg daily and Plavix 25 mg daily x3 months then Plavix alone 3. Pain Management: Lyrica 25 mg daily, Hx post herpetic neuralgia, skin rxn to  lidocaine patch again will d/c, increase lyrica to 100 mg. Improved sleep last noc   discussed calcific tendinopathy XR results with pt and daughte r- pt without movement related pain so xray finding are not cause of symptoms 4. Mood: Provide emotional support, family supportive              -antipsychotic agents: N/A 5. Neuropsych: This patient is capable of making decisions on her own behalf. 6. Skin/Wound Care: Routine skin checks 7. Fluids/Electrolytes/Nutrition: intake poor to inconsistent d/t nausea  -encouraged her to push po as much as she can. 8.  Hypertension.  Coreg 12.5 mg twice daily.  Monitor with increased mobility.    Vitals:   11/21/19 0542 11/21/19 0821  BP: (!) 144/69 (!) 144/62  Pulse: (!) 57 (!) 57  Resp: 16   Temp: 98.5 F (36.9 C)   SpO2: 92%   BPs stabilizing  Start  amlodipine 47m. Decrease Coreg to 3.1226mBID as HR is low.   9.  Diabetes mellitus peripheral neuropathy.  Hemoglobin A1c 10.1.  SSI.  Patient on Glucophage 500 mg twice daily prior to admission.  Resume as needed             CBG (last 3)  Recent Labs    11/20/19 1626 11/20/19 2118 11/21/19 0611  GLUCAP  105* 92 104*  Cont metformin  CBGs improving  10.  Hyperlipidemia.  Lipitor 12.  Tobacco abuse.  Counseling 13.  Vestibular sx: added scopolamine patch with some improvement. Less limited  -scheduled zofran   -acclimation, OOB  -11/2 spoke to pt/daughter.discussed favorable prognosis for improvement of vestibular sx but that it may take several weeks   14.  Urinary retention still +2 total xfer will do voiding trial when transfers improve  LOS: 12 days A FACE TO [place] E Verlon Pischke 11/21/2019, 9:01 AM

## 2019-11-21 NOTE — Progress Notes (Signed)
Occupational Therapy Session Note  Patient Details  Name: Chelsea [name] MRN: [id number] Date of Birth: [birth year]  Today's Date: 11/21/2019 OT Individual Time: 1349-1458 OT Individual Time Calculation (min): 69 min    Short Term Goals: Week 2:  OT Short Term Goal 1 (Week 2): Patient will complete squat-pivot transfers with Mod A. OT Short Term Goal 2 (Week 2): Patient will thread BLE through LB clothing seated EOB. OT Short Term Goal 3 (Week 2): Patient will tolerate OOB activity for 1hr in prep for return to PLOF. OT Short Term Goal 4 (Week 2): Patient will demonstrate dynamic sitting balance with Min A in prep for LB BADLs.  Skilled Therapeutic Interventions/Progress Updates:    Treatment session with focus on self-care retraining, midline orientation, dynamic standing balance, transfers, and overall participation in treatment session. Pt received in recliner in slumped position, requiring increased cues and assistance to reposition in to more upright sitting posture.  Pt reporting frustration with nausea at beginning of session, however no c/o nausea or dizziness during session. Max encouragement to participate in treatment session.  Pt ultimately agreeable to bathing at sink side. Pt completed stand pivot transfer recliner to w/c with mod assist due to increased Rt lean.  BP taken prior to transfer 162/55 and 132/70 after transfer, but no increase in dizziness.  Engaged in bathing at sit > stand level at sink with use of mirror for visual feedback for midline orientation and posture in sitting and standing due to tendency to lean Rt.  Pt completed bathing with min assist for sit > stand, standing balance, and to wash buttocks.  Pt donned pull over gown with setup assist and assisted with donning hospital incontinence brief at sit > stand level. Pt completed oral care in sitting with setup assist.  Pt requested to return to bed at end of session.  Completed stand pivot transfer min assist to Rt  and able to lift BLE in to bed with increased time.  Pt remained semi-reclined in bed with all needs in reach.  Therapy Documentation Precautions:  Precautions Precautions: Fall Restrictions Weight Bearing Restrictions: No General:   Vital Signs: Therapy Vitals Temp: 98.1 F (36.7 C) Pulse Rate: (!) 56 Resp: (!) 24 BP: (!) 158/62 Patient Position (if appropriate): Lying Oxygen Therapy SpO2: 99 % O2 Device: Room Air Pain:  Pt with no c/o pain   Therapy/Group: Individual Therapy  Rosalio Loud 11/21/2019, 4:14 PM

## 2019-11-21 NOTE — Progress Notes (Signed)
Patient ID: Chelsea [name], female   DOB: 1945-07-20, 74 y.o.   MRN: [id number] Team Conference Report to Patient/Family  Team Conference discussion was reviewed with the patient and caregiver, including goals, any changes in plan of care and target discharge date.  Patient and caregiver express understanding and are in agreement.  The patient has a target discharge date of 12/04/19 (Pending OR schedule).  Andria Rhein 11/21/2019, 12:48 PM

## 2019-11-21 NOTE — Progress Notes (Signed)
Physical Therapy Session Note  Patient Details  Name: Chelsea [name] MRN: [id number] Date of Birth: January 16, 1945  Today's Date: 11/21/2019 PT Individual Time: 0900-1000 AND 1130-1200 [id number] PT Individual Time Calculation (min): 60 min 30 min and 27 min   Short Term Goals: Week 2:  PT Short Term Goal 1 (Week 2): Pt will perform bed mobility with min assist consistently PT Short Term Goal 2 (Week 2): Pt will perform bed<>WC trasnfer with min assist and LRAD PT Short Term Goal 3 (Week 2): Pt will Ambulate 61f with mod assist and LRAD  Skilled Therapeutic Interventions/Progress Updates:   Session 1  Pt received supine in bed and agreeable to PT. Supine>sit transfer with supervision assist on the L side of the bed.  Sitting EOB with supervision assist and min cues for midline orientation.   [facility] and max cues for improve LE positioning and decreased R lateral lean. Pt transported to rehab gym.   Sit<>stand in parallel bars x 4 with min assist and moderate cues for UE placement and LE positioning. Dynamic gait training in parallel bars forward/reverse x 81feach with min-mod assist and increasing cues for use of visual feedback of mirror to improve midline orientation.   Gait training with RW over floor x 3088fith mod assist and close WC follow due to increasing R lateral lean with fatigue. Pt reporting increased dizziness and lightheadedness. PT assessed orthostatic VS.  BP 166/62, HR 57. BP 115/59, HR 60. MD made aware.   Patient returned to room and performed stand pivot to recliner with RW and heavy mod assist due to poor sequencing. Pt left sitting in recliner with call bell in reach and all needs met.     Session 2.   Pt received sitting in recliner and agreeable to PT. Pt performed sit<>stand from WC x 4 with min-mod assist throughout session with cues for attention to task and improved midline awareness.   Reciprocal marching in stance 2 x  10 BLE with mod assist on first bout and min assist on second with moderate cues for attention to task from PT. Pt noted to have significant improvement in ability to maintain midline and perform lateral weight shift to the L when not talking while performing balance activity.   Seated therex. Hip abduction x 10 BLE, LAQ x 10 cues for decreased speed of eccentric movement throughout.   Pt left sitting in recliner with call bell in reach and all needs met.    Session 3.   Pt received supine in bed and agreeable to PT. Supine>sit transfer with supervision assist with use of bed rails. Sit<>stand from EOB with min assist for anterior weight shift. Noted to have improved midline orientation compared to AM sessions. Lateral reaches x 10 BUE with cues for visual scanning to the L. Forward/reverse gait 2 x 10 ft with RW and min assist forAD management and cues for sequencing.  Sit>supine completed with min assist, and left supine in bed with call bell in reach and all needs met.           Therapy Documentation Precautions:  Precautions Precautions: Fall Restrictions Weight Bearing Restrictions: No    Vital Signs: Therapy Vitals Temp: 98.2 F (36.8 C) Temp Source: Oral Pulse Rate: (!) 56 Resp: 14 BP: (!) 150/60 Patient Position (if appropriate): Lying Oxygen Therapy SpO2: 96 % O2 Device: Room Air Pain: denies  Therapy/Group: Individual Therapy  AusLorie [name]/10/2019, 10:29 AM

## 2019-11-21 NOTE — Patient Care Conference (Signed)
Inpatient RehabilitationTeam Conference and Plan of Care Update Date: 11/21/2019   Time: 10:10 AM    Patient Name: Chelsea [name]      Medical Record Number: [id number]  Date of Birth: 12/16/1945 Sex: Female         Room/Bed: 4W12C/4W12C-01 Payor Info: Payor: [employer]na [employer]ANTAGE / Plan: [employer] PPO / Product Type: *No Product type* /    Admit Date/Time:  11/09/2019  7:29 PM  Primary Diagnosis:  <principal problem not specified>  Hospital Problems: Active Problems:   Acute ischemic right posterior cerebral artery (PCA) stroke (HCC)   Pain   Stroke [facility])    Expected Discharge Date: Expected Discharge Date: 12/04/19 (Pending OR schedule)  Team Members Present: Physician leading conference: Dr. Claudette Laws Care Coodinator Present: Chana Bode, RN, BSN, CRRN;Christina Vita Barley, BSW Nurse Present: Other (comment) Joycelyn Das, RN) PT Present: Grier Rocher, PT OT Present: Lina Sayre, OT SLP Present: Suzzette Righter, CF-SLP PPS Coordinator present : Fae Pippin, [place], PT     Current Status/Progress Goal Weekly Team Focus  Bowel/Bladder   Pt has foley for urinary retention. Cont/incont of BM- LBM 11/16/19 ( per report), PRN miralax given  To promote continence for bowel and eventually get to a point where pt does not need foley.  q2h toileting/ PRN   Swallow/Nutrition/ Hydration             ADL's   S to Min guard for balance UB bathing/dressing seated EOB, Min to Mod A LB bathing/dressing, Min to Mod A stand-pivot transfers. Improved participation over last several days.  Min to Mod A  Activity tolerance, sitting/standing balance, ADL transfers, family ed.   Mobility   CGA supine to sit, minA sit to supine, minA nearing CGA STS, minA stand pivot, supervision w/c mobilty, gait up to 34ft with RW and minA.  CGA transfers and bed mobility; min A gait; CGA WC mobility  R NMR, balance, standing tolerance, BLE strengthening    Communication             Safety/Cognition/ Behavioral Observations  At baseline, no further ST indicated  Supervision  d/c from ST 11/3   Pain   Pt c/o L shoulder pain, PRN tylenol and k pad  To decrease pain level below 3/10.  Assess pain qshift/prn   Skin   Skin intact.  Prevent skin breakdown from occurring.  Assess skin qshift/prn     Discharge Planning:  Discharging home with daughter Smith Robert, 24/7) Has: shower seat and RW) 1 Level, 4 steps to enter   Team Discussion: Dense hemi post ischemic right PCA stroke; pending OR. Post herpetic neuralgia controlled. Blood pressure is stable with bradycardia. Plan to discontinue foley and start voiding trial per MD. Note light-headedness with activity and confirmed orthostasis; staff requested abdominal binder. Staff confirmed one person assistance level for transfers to the [facility] even with sitting balance issues. Patient on target to meet rehab goals: yes, min assist goals. Currently min - mod assist and making good progress.   *See Care Plan and progress notes for long and short-term goals.   Revisions to Treatment Plan:  Encourage participation in therapy and self care activities.  Teaching Needs: Transfers, toileting, medications, etc.   Current Barriers to Discharge: Decreased caregiver support, Home enviroment access/layout and Behavior  Possible Resolutions to Barriers: Ramp for entry of home with 4 steps Family  Education with daughter     Medical Summary Current Status: planning L CEA post discharge, has foley,  Barriers  to Discharge: Medical stability;Incontinence;Other (comments)  Barriers to Discharge Comments: urinary retention, bowel incont Possible Resolutions to Barriers/Weekly Focus: adjusting pain meds, goal is voiding trial for bladder retention   Continued Need for Acute Rehabilitation Level of Care: The patient requires daily medical management by a physician with specialized training in physical medicine  and rehabilitation for the following reasons: Direction of a multidisciplinary physical rehabilitation program to maximize functional independence : Yes Medical management of patient stability for increased activity during participation in an intensive rehabilitation regime.: Yes Analysis of laboratory values and/or radiology reports with any subsequent need for medication adjustment and/or medical intervention. : Yes   I attest that I was present, lead the team conference, and concur with the assessment and plan of the team.   Chana Bode B 11/21/2019, 2:21 PM

## 2019-11-22 ENCOUNTER — Inpatient Hospital Stay (HOSPITAL_COMMUNITY): Payer: PPO | Admitting: Occupational Therapy

## 2019-11-22 ENCOUNTER — Inpatient Hospital Stay (HOSPITAL_COMMUNITY): Payer: PPO | Admitting: Physical Therapy

## 2019-11-22 LAB — GLUCOSE, CAPILLARY
Glucose-Capillary: 125 mg/dL — ABNORMAL HIGH (ref 70–99)
Glucose-Capillary: 156 mg/dL — ABNORMAL HIGH (ref 70–99)
Glucose-Capillary: 155 mg/dL — ABNORMAL HIGH (ref 70–99)
Glucose-Capillary: 182 mg/dL — ABNORMAL HIGH (ref 70–99)

## 2019-11-22 MED ORDER — CARVEDILOL 3.125 MG PO TABS
3.1250 mg | ORAL_TABLET | Freq: Every day | ORAL | Status: DC
  Administered 2019-11-23 – 2019-12-02 (×10): 3.125 mg via ORAL
  Filled 2019-11-22 (×10): qty 1

## 2019-11-22 MED ORDER — AMLODIPINE BESYLATE 10 MG PO TABS
10.0000 mg | ORAL_TABLET | Freq: Every day | ORAL | Status: DC
  Administered 2019-11-23 – 2019-12-02 (×10): 10 mg via ORAL
  Filled 2019-11-22 (×10): qty 1

## 2019-11-22 NOTE — Progress Notes (Signed)
Orthopedic Tech Progress Note Patient Details:  Chelsea [name] 01-Jul-1945 [id number] RN applied Binder Ortho Devices Type of Ortho Device: Abdominal binder Ortho Device/Splint Interventions: Ordered   Post Interventions Patient Tolerated: Well   [name] [name] 11/22/2019, 4:54 PM

## 2019-11-22 NOTE — Progress Notes (Signed)
[place] PHYSICAL MEDICINE & REHABILITATION PROGRESS NOTE   Subjective/Complaints: No complaints this morning L shoulder pain is better controlled Needs to use the commode Daughter visiting  ROS: Patient denies SOB,negative abd pain, +bowel incont, last BM 11/9  Objective:   No results found. No results for input(s): WBC, HGB, HCT, PLT in the last 72 hours. No results for input(s): NA, K, CL, CO2, GLUCOSE, BUN, CREATININE, CALCIUM in the last 72 hours.  Intake/Output Summary (Last 24 hours) at 11/22/2019 0946 Last data filed at 11/22/2019 0751 Gross per 24 hour  Intake 360 ml  Output 1300 ml  Net -940 ml   Physical Exam: Vital Signs Blood pressure (!) 159/58, pulse 62, temperature 98.7 F (37.1 C), temperature source Oral, resp. rate 16, height 5\' 7"  (1.702 m), weight 64 kg, SpO2 91 %.  General: Alert, No apparent distress HEENT: Head is normocephalic, atraumatic, PERRLA, EOMI, sclera anicteric, oral mucosa pink and moist, dentition intact, ext ear canals clear,  Neck: Supple without JVD or lymphadenopathy Heart: Reg rate and rhythm. No murmurs rubs or gallops Chest: CTA bilaterally without wheezes, rales, or rhonchi; no distress Abdomen: Soft, non-tender, non-distended, bowel sounds positive. Extremities: No clubbing, cyanosis, or edema. Pulses are 2+ Skin: Clean and intact without signs of breakdown  Neuro: eyes opened. Follows commands. Initiates conversation and demonstrates fair insight and awareness. Poor visual acuity/depth perception when eyes are open. 4/5 RUE, LUE, LLE, RLE  Senses LT in all 4's.  Musculoskeletal:mild shoulder pain mainly at night    Assessment/Plan: 1. Functional deficits secondary to right PCA infarct which require 3+ hours per day of interdisciplinary therapy in a comprehensive inpatient rehab setting.  Physiatrist is providing close team supervision and 24 hour management of active medical problems listed below.  Physiatrist and rehab  team continue to assess barriers to discharge/monitor patient progress toward functional and medical goals  Care Tool:  Bathing    Body parts bathed by patient: Right arm, Left arm, Chest, Abdomen, Front perineal area, Right upper leg, Left upper leg, Face   Body parts bathed by helper: Buttocks     Bathing assist Assist Level: Minimal Assistance - Patient > 75%     Upper Body Dressing/Undressing Upper body dressing   What is the patient wearing?: Pull over shirt    Upper body assist Assist Level: Supervision/Verbal cueing    Lower Body Dressing/Undressing Lower body dressing      What is the patient wearing?: Incontinence brief     Lower body assist Assist for lower body dressing: Moderate Assistance - Patient 50 - 74%     Toileting Toileting    Toileting assist Assist for toileting: Moderate Assistance - Patient 50 - 74%     Transfers Chair/bed transfer  Transfers assist  Chair/bed transfer activity did not occur: Safety/medical concerns  Chair/bed transfer assist level: Moderate Assistance - Patient 50 - 74%     Locomotion Ambulation   Ambulation assist   Ambulation activity did not occur: Safety/medical concerns  Assist level: Minimal Assistance - Patient > 75% Assistive device: Walker-rolling Max distance: 90 ft   Walk 10 feet activity   Assist  Walk 10 feet activity did not occur: Safety/medical concerns  Assist level: Minimal Assistance - Patient > 75% Assistive device: Walker-rolling   Walk 50 feet activity   Assist Walk 50 feet with 2 turns activity did not occur: Safety/medical concerns  Assist level: Minimal Assistance - Patient > 75% Assistive device: Walker-rolling    Walk 150 feet  activity   Assist Walk 150 feet activity did not occur: Safety/medical concerns         Walk 10 feet on uneven surface  activity   Assist Walk 10 feet on uneven surfaces activity did not occur: Safety/medical concerns          Wheelchair     Assist Will patient use wheelchair at discharge?: Yes Type of Wheelchair: Manual Wheelchair activity did not occur: Safety/medical concerns (pt unable to participate in [facility] assessment this date 2/2 severe nausea and vomiting and unable to transfer OOB at this time.)         Wheelchair 50 feet with 2 turns activity    Assist    Wheelchair 50 feet with 2 turns activity did not occur: Safety/medical concerns       Wheelchair 150 feet activity     Assist  Wheelchair 150 feet activity did not occur: Safety/medical concerns       Blood pressure (!) 159/58, pulse 62, temperature 98.7 F (37.1 C), temperature source Oral, resp. rate 16, height 5\' 7"  (1.702 m), weight 64 kg, SpO2 91 %.  Medical Problem List and Plan: 1.  Decreased functional mobility with falls loss of vision left peripheral field secondary to right PCA infarction in the setting of left vertebral artery occlusion along with severe left ICA stenosis with previously silent subacute left parietal infarct secondary to large vessel disease.             -patient may shower             -ELOS/Goals: 12-16 days/Min/Mod A      Continue CIR 2.  Antithrombotics: -DVT/anticoagulation: Lovenox             -antiplatelet therapy: Aspirin 81 mg daily and Plavix 25 mg daily x3 months then Plavix alone 3. Pain Management: Lyrica 25 mg daily, Hx post herpetic neuralgia, skin rxn to  lidocaine patch again will d/c, increase lyrica to 100 mg. Improved sleep last noc. Pain is currently well controlled   discussed calcific tendinopathy XR results with pt and daughte r- pt without movement related pain so xray finding are not cause of symptoms 4. Mood: Provide emotional support, family supportive             -antipsychotic agents: N/A 5. Neuropsych: This patient is capable of making decisions on her own behalf. 6. Skin/Wound Care: Routine skin checks 7. Fluids/Electrolytes/Nutrition: intake poor to inconsistent  d/t nausea  -encouraged her to push po as much as she can. 8.  Hypertension.  Coreg 12.5 mg twice daily.  Monitor with increased mobility.    Vitals:   11/21/19 1938 11/22/19 0507  BP: (!) 152/56 (!) 159/58  Pulse: (!) 59 62  Resp: 17 16  Temp: 98.6 F (37 C) 98.7 F (37.1 C)  SpO2: 96% 91%  BPs stabilizing  Increase amlodipine to 10mg  and decrease Coreg to daily.   9.  Diabetes mellitus peripheral neuropathy.  Hemoglobin A1c 10.1.  SSI.  Patient on Glucophage 500 mg twice daily prior to admission.  Resume as needed             CBG (last 3)  Recent Labs    11/21/19 1655 11/21/19 2115 11/22/19 0619  GLUCAP 80 175* 125*  Cont metformin  CBGs elevated to 175 11/10: continue to monitor 10.  Hyperlipidemia.  Lipitor 12.  Tobacco abuse.  Counseling 13.  Vestibular sx: added scopolamine patch with some improvement. Less limited  -scheduled zofran   -  acclimation, OOB  -11/2 spoke to pt/daughter.discussed favorable prognosis for improvement of vestibular sx but that it may take several weeks 14.  Urinary retention still +2 total xfer will do voiding trial when transfers improve  LOS: 13 days A FACE TO FACE EVALUATION WAS PERFORMED  Drema Pry Josselyne Onofrio 11/22/2019, 9:46 AM

## 2019-11-22 NOTE — Progress Notes (Signed)
Physical Therapy Session Note  Patient Details  Name: Chelsea [name] MRN: [id number] Date of Birth: [birth year]  Today's Date: 11/22/2019 PT Individual [phone] and 1600-1639   60 min and 39 min   Short Term Goals: Week 2:  PT Short Term Goal 1 (Week 2): Pt will perform bed mobility with min assist consistently PT Short Term Goal 2 (Week 2): Pt will perform bed<>WC trasnfer with min assist and LRAD PT Short Term Goal 3 (Week 2): Pt will Ambulate 65f with mod assist and LRAD  Skilled Therapeutic Interventions/Progress Updates:  Session 1 Pt received supine in bed and agreeable to PT. Supine>sit transfer with min  assist for UE placement as awareness of bed features on the L.   Once in sitting, pt noted to have incontinent bowel movement. Ambulatory transfer to toilet with min-mod assist and manual facilitation to improve weight shift L.   Sit<>[facility] and pericare with assist from daughter. Pt performed ambulatory transfer back EOB with RW and min assist overall with facilitation as listed above. Sit>supine with min assist for LLE management. Once in supine, pt pt noted to have additional incontinent bowel movement. Pt returned to toilet with ambulatory transfer as listed above. Pt left sitting on BSC over toilet with daughter present and RN aware of pt position.   Session 2.  Pt received supine in bed and agreeable to PT at bed level . Supine NMR instructed by PT: SAQ, SLR, hip abduction, ankle DF/PF, heel slides, bridges, clam shells. Each completed 2 x 10 BLE with level 2 tband. Cues for decreased speed of eccentric movement. 2-3 sec hold at end range, and decreased compensations from trunk. Pt left supine in bed with call bell in reach and all needs met.     Therapy Documentation Precautions:  Precautions Precautions: Fall Restrictions Weight Bearing Restrictions: No General:   Vital Signs: Therapy Vitals Temp: 98.1 F  (36.7 C) Pulse Rate: 67 Resp: 18 BP: (!) 156/63 Patient Position (if appropriate): Lying Oxygen Therapy SpO2: 97 % O2 Device: Room Air Pain: Denies at rest in first session. 6/10 L shoulder second session. RN aware and pain med provided prior to treatment. See MAR   Therapy/Group: Individual Therapy  ALorie Phenix11/11/2019, 3:52 PM

## 2019-11-22 NOTE — Progress Notes (Signed)
Occupational Therapy Session Note  Patient Details  Name: Chelsea [name] MRN: [id number] Date of Birth: [birth year]  Today's Date: 11/22/2019 OT Individual Time: 1302-1401 OT Individual Time Calculation (min): 59 min    Short Term Goals: Week 2:  OT Short Term Goal 1 (Week 2): Patient will complete squat-pivot transfers with Mod A. OT Short Term Goal 2 (Week 2): Patient will thread BLE through LB clothing seated EOB. OT Short Term Goal 3 (Week 2): Patient will tolerate OOB activity for 1hr in prep for return to PLOF. OT Short Term Goal 4 (Week 2): Patient will demonstrate dynamic sitting balance with Min A in prep for LB BADLs.   Skilled Therapeutic Interventions/Progress Updates:    Pt greeted at time of session laying on R side on bed pan, daughter present and remained throughout session, pt stating she had been having loose bowels all day. When finished using bed pan, rolled to R side Min-Mod A, total assist with hygiene. Pt in gown and wanted to remain that way, did not want to get dressed, use toilet, or wash up/bathe today. Pt agreeable to bed level activity only, performing BUE exercises with 2# dowel for chest press, FWD circles, and IR/ER with dowel with cues for maintaining midline with dowel and head/neck for orientation and pacing for coordinated movements. Dynamic activity semireclined in bed with 2# dowel hitting beach ball back and forth to improve reaction speed, coordination, midline orientation, and attention to tasks as she had to perform long term recall with each hit. Pt c/o L shoulder discomfort (long term from shingles) after activity and attempted elevating for comfort but did not help, no number given for pain, RN aware. Alarm on, call bell in reach.   Therapy Documentation Precautions:  Precautions Precautions: Fall Restrictions Weight Bearing Restrictions: No     Therapy/Group: Individual Therapy  Erasmo Score 11/22/2019, 5:20 PM

## 2019-11-22 NOTE — Progress Notes (Signed)
Recreational Therapy Session Note  Patient Details  Name: Chelsea [name] MRN: [id number] Date of Birth: [birth year] Today's Date: 11/22/2019  Pain: c/o Left shoulder pain ("shingles pain) at end of the session, offered repositioning, pt requesting use of heating pad/applied, RN made aware pt requesting pain med Skilled Therapeutic Interventions/Progress Updates: Pt on the bedpan upon arrival and daughter present.  OT assisted pt toileting and then UE exercise bed level.  Pts daughter discussing d/c plans with LRT, her concerns about home health agencies and their effectiveness in assisting in continued recovery after discharge from CIR.  Daughter states familiarity with multiple HH agencies through her work as a Lawyer and feels therapists do not challenge pt.  LRT discussed the importance of advocating for her mom and the services she needed regardless of the provider. Daughter stated understanding.  Also discussed pts leisure interests and activity modification that daughter had provided PTA.  Daughter well aware of the importance of leisure (cooking, crafting) and how to incorporate those into routines once home.  Therapy/Group: Co-Treatment  Shasta Chinn 11/22/2019, 3:26 PM

## 2019-11-23 ENCOUNTER — Inpatient Hospital Stay (HOSPITAL_COMMUNITY): Payer: PPO | Admitting: Physical Therapy

## 2019-11-23 ENCOUNTER — Inpatient Hospital Stay (HOSPITAL_COMMUNITY): Payer: PPO | Admitting: Occupational Therapy

## 2019-11-23 LAB — CREATININE, SERUM
Creatinine, Ser: 0.89 mg/dL (ref 0.44–1.00)
GFR, Estimated: >60 mL/min (ref >60)

## 2019-11-23 LAB — GLUCOSE, CAPILLARY
Glucose-Capillary: 127 mg/dL — ABNORMAL HIGH (ref 70–99)
Glucose-Capillary: 130 mg/dL — ABNORMAL HIGH (ref 70–99)
Glucose-Capillary: 132 mg/dL — ABNORMAL HIGH (ref 70–99)
Glucose-Capillary: 134 mg/dL — ABNORMAL HIGH (ref 70–99)

## 2019-11-23 MED ORDER — DOCUSATE SODIUM 100 MG PO CAPS
100.0000 mg | ORAL_CAPSULE | Freq: Three times a day (TID) | ORAL | Status: DC
  Administered 2019-11-23 – 2019-11-27 (×14): 100 mg via ORAL
  Filled 2019-11-23 (×14): qty 1

## 2019-11-23 MED ORDER — LISINOPRIL 2.5 MG PO TABS
2.5000 mg | ORAL_TABLET | Freq: Every day | ORAL | Status: DC
  Administered 2019-11-23 – 2019-12-02 (×10): 2.5 mg via ORAL
  Filled 2019-11-23 (×10): qty 1

## 2019-11-23 NOTE — Progress Notes (Signed)
Occupational Therapy Session Note  Patient Details  Name: Chelsea [name] MRN: [id number] Date of Birth: 27-Feb-1945  Today's Date: 11/23/2019 OT Individual Time: 8550-1586 OT Individual Time Calculation (min): 44 min    Short Term Goals: Week 2:  OT Short Term Goal 1 (Week 2): Patient will complete squat-pivot transfers with Mod A. OT Short Term Goal 2 (Week 2): Patient will thread BLE through LB clothing seated EOB. OT Short Term Goal 3 (Week 2): Patient will tolerate OOB activity for 1hr in prep for return to PLOF. OT Short Term Goal 4 (Week 2): Patient will demonstrate dynamic sitting balance with Min A in prep for LB BADLs.  Skilled Therapeutic Interventions/Progress Updates:  Patient met lying supine in bed in agreement with OT treatment session. Patient c/o mild chronic pain in LUE. K pad in place upon arrival. Supine to EOB with patient able to advance BLE toward EOB and heavy Min A to bring trunk upright. Initial assist to correct R posteriolateral lean but advanced to close supervision A. Sit to stand from EOB to RW with Mod A and R lateroposterior bias corrected with multimodal cues. Noted knee flexion with alternating hyperextension in RLE making mobility a hardship. Patient unable to maintain balance enough for safe functional mobility. Stand to sit in wc with cues for hand placement. At sink level, patient bathed/dressed UB with set-up assist. Patient also able to wash front perineal area and buttocks in standing with multimodal cues for orientation to midline and neutral R knee. Seated in wc, patient able to thread BLE through LB clothing with assist to maintain balance and hike pants over hips. Session concluded with patient seated in recliner with call bell within reach, belt alarm activated, and daughter present at bedside.   Therapy Documentation Precautions:  Precautions Precautions: Fall Restrictions Weight Bearing Restrictions: No General:    Therapy/Group: Individual  Therapy  [name] 11/23/2019, 7:23 AM

## 2019-11-23 NOTE — Progress Notes (Signed)
Physical Therapy Session Note  Patient Details  Name: Chelsea [name] MRN: [id number] Date of Birth: 29-May-1945  Today's Date: 11/23/2019 PT Individual Time: 1110-1205 AND 1400-1443 and [phone] PT Individual Time Calculation (min): 55 min and 43 min and 23 min   Short Term Goals: Week 1:  PT Short Term Goal 1 (Week 1): pt to tolerate OOB upright position for 2 hours PT Short Term Goal 1 - Progress (Week 1): Met PT Short Term Goal 2 (Week 1): pt to demonstrate supine<>sit mod A PT Short Term Goal 2 - Progress (Week 1): Met PT Short Term Goal 3 (Week 1): pt to demonstrate sitting balance CGA PT Short Term Goal 3 - Progress (Week 1): Met PT Short Term Goal 4 (Week 1): pt to demonstrate sit<>stand transfers with LRAD at min A PT Short Term Goal 4 - Progress (Week 1): Not met PT Short Term Goal 5 (Week 1): initiate gait training PT Short Term Goal 5 - Progress (Week 1): Met Week 2:  PT Short Term Goal 1 (Week 2): Pt will perform bed mobility with min assist consistently PT Short Term Goal 2 (Week 2): Pt will perform bed<>WC trasnfer with min assist and LRAD PT Short Term Goal 3 (Week 2): Pt will Ambulate 79f with mod assist and LRAD  Skilled Therapeutic Interventions/Progress Updates:  Session 1   Pt received supine in bed and agreeable to PT. Supine>sit transfer with supervision assist and increased time. Sit<>stand fro mEOB with mod assist due to lateral lean to the R. [facility].   [facility].  Sitting 177/63, HR 56.  Standing: 169/65, HR %*.  Standing 3 minutes 158/65. HR 62.   Gait training with visual feedback from mirror with RW forward/reverse x 163fand mod assist for midline and improved step width. Gait training then performed with 3 musketeer assist and mod assist for safety. Pt noted to have improved midline orientation with forward progress, but difficulty motor planning  retroversion.  Additional gait training with RW x [id number] mod assist overall, cues for improved step length on the R and improved terminal knee extension in stance.    Session 2.   Pt received supine in bed and agreeable to PT. Supine>sit transfer with supervision assist.  Stand pivot transfer to WC [facility] min-mod assist. With moderate cues for sequencing and AD management due to moderate pushing to the R.   Orthostatic BP assessed without binder.  Sitting: 165/65, standing, 129/64. Standing 3 minutes, 145/53, sitting 135/62. Sitting 3 minutes 159/65.   Gait training in room x [id number] mod assist progressing to min assist. From PT for safety and improved weight shift to the L and attention to the positioning of the RLE.   Pt performed stand pivot transfer to bed with RW and min assist. Sit>supine completed with supervision assist and increased time. pt left supine in bed with call bell in reach and all needs met.    Session 3.   Pt received supine in bed and agreeable to PT. Supine>sit transfer with supervision assist. Daughter present and assisted pt with shoe management. Stand pivot [facility] and only min cues for AD management. Pt reporting decreased dizziness in stance this PM verses prior sessions, pt also noting severe fatigue from therapy throughout the day, and requesting to rest. Pt left in bed with call bell in reach and  all needs met.       Therapy Documentation Precautions:  Precautions Precautions: Fall Restrictions Weight Bearing Restrictions: No   Pain: Denies atrest    Therapy/Group: Individual Therapy  Lorie Phenix 11/23/2019, 3:19 PM

## 2019-11-23 NOTE — Progress Notes (Signed)
[place] PHYSICAL MEDICINE & REHABILITATION PROGRESS NOTE   Subjective/Complaints: L shoulder pain is improved Daughter is concerned about the foods she receives in her diet and her CBG spiking as a result- changed diet to no added sugar/processed foods and provided dietary counseling for when she returns home.   ROS: Patient denies SOB,negative abd pain, +bowel incont, last BM 11/9  Objective:   No results found. No results for input(s): WBC, HGB, HCT, PLT in the last 72 hours. Recent Labs    11/23/19 0502  CREATININE 0.89    Intake/Output Summary (Last 24 hours) at 11/23/2019 1240 Last data filed at 11/23/2019 1200 Gross per 24 hour  Intake 240 ml  Output 1650 ml  Net -1410 ml   Physical Exam: Vital Signs Blood pressure (!) 172/61, pulse 60, temperature 98.1 F (36.7 C), temperature source Oral, resp. rate 20, height 5\' 7"  (1.702 m), weight 64 kg, SpO2 95 %.  General: Alert, No apparent distress HEENT: Head is normocephalic, atraumatic, PERRLA, EOMI, sclera anicteric, oral mucosa pink and moist, dentition intact, ext ear canals clear,  Neck: Supple without JVD or lymphadenopathy Heart: Reg rate and rhythm. No murmurs rubs or gallops Chest: CTA bilaterally without wheezes, rales, or rhonchi; no distress Abdomen: Soft, non-tender, non-distended, bowel sounds positive. Extremities: No clubbing, cyanosis, or edema. Pulses are 2+ Skin: Clean and intact without signs of breakdown  Neuro: eyes opened. Follows commands. Initiates conversation and demonstrates fair insight and awareness. Poor visual acuity/depth perception when eyes are open. 4/5 RUE, LUE, LLE, RLE  Senses LT in all 4's.  Musculoskeletal:mild shoulder pain mainly at night    Assessment/Plan: 1. Functional deficits secondary to right PCA infarct which require 3+ hours per day of interdisciplinary therapy in a comprehensive inpatient rehab setting.  Physiatrist is providing close team supervision and 24 hour  management of active medical problems listed below.  Physiatrist and rehab team continue to assess barriers to discharge/monitor patient progress toward functional and medical goals  Care Tool:  Bathing    Body parts bathed by patient: Right arm, Left arm, Chest, Abdomen, Front perineal area, Right upper leg, Left upper leg, Face, Buttocks   Body parts bathed by helper: Buttocks Body parts n/a: Right lower leg, Left lower leg   Bathing assist Assist Level: Minimal Assistance - Patient > 75%     Upper Body Dressing/Undressing Upper body dressing   What is the patient wearing?: Pull over shirt    Upper body assist Assist Level: Supervision/Verbal cueing    Lower Body Dressing/Undressing Lower body dressing      What is the patient wearing?: Incontinence brief     Lower body assist Assist for lower body dressing: Minimal Assistance - Patient > 75%     Toileting Toileting    Toileting assist Assist for toileting: Moderate Assistance - Patient 50 - 74%     Transfers Chair/bed transfer  Transfers assist  Chair/bed transfer activity did not occur: Safety/medical concerns  Chair/bed transfer assist level: Moderate Assistance - Patient 50 - 74%     Locomotion Ambulation   Ambulation assist   Ambulation activity did not occur: Safety/medical concerns  Assist level: Minimal Assistance - Patient > 75% Assistive device: Walker-rolling Max distance: 90 ft   Walk 10 feet activity   Assist  Walk 10 feet activity did not occur: Safety/medical concerns  Assist level: Minimal Assistance - Patient > 75% Assistive device: Walker-rolling   Walk 50 feet activity   Assist Walk 50 feet with 2  turns activity did not occur: Safety/medical concerns  Assist level: Minimal Assistance - Patient > 75% Assistive device: Walker-rolling    Walk 150 feet activity   Assist Walk 150 feet activity did not occur: Safety/medical concerns         Walk 10 feet on uneven  surface  activity   Assist Walk 10 feet on uneven surfaces activity did not occur: Safety/medical concerns         Wheelchair     Assist Will patient use wheelchair at discharge?: Yes Type of Wheelchair: Manual Wheelchair activity did not occur: Safety/medical concerns (pt unable to participate in [facility] assessment this date 2/2 severe nausea and vomiting and unable to transfer OOB at this time.)         Wheelchair 50 feet with 2 turns activity    Assist    Wheelchair 50 feet with 2 turns activity did not occur: Safety/medical concerns       Wheelchair 150 feet activity     Assist  Wheelchair 150 feet activity did not occur: Safety/medical concerns       Blood pressure (!) 172/61, pulse 60, temperature 98.1 F (36.7 C), temperature source Oral, resp. rate 20, height 5\' 7"  (1.702 m), weight 64 kg, SpO2 95 %.  Medical Problem List and Plan: 1.  Decreased functional mobility with falls loss of vision left peripheral field secondary to right PCA infarction in the setting of left vertebral artery occlusion along with severe left ICA stenosis with previously silent subacute left parietal infarct secondary to large vessel disease.             -patient may shower             -ELOS/Goals: 12-16 days/Min/Mod A      Continue CIR 2.  Antithrombotics: -DVT/anticoagulation: Lovenox             -antiplatelet therapy: Aspirin 81 mg daily and Plavix 25 mg daily x3 months then Plavix alone 3. Pain Management: Lyrica 25 mg daily, Hx post herpetic neuralgia, skin rxn to  lidocaine patch again will d/c, increase lyrica to 100 mg. Improved sleep last noc. Pain is currently well controlled   discussed calcific tendinopathy XR results with pt and daughte r- pt without movement related pain so xray finding are not cause of symptoms 4. Mood: Provide emotional support, family supportive             -antipsychotic agents: N/A 5. Neuropsych: This patient is capable of making decisions on  her own behalf. 6. Skin/Wound Care: Routine skin checks 7. Fluids/Electrolytes/Nutrition: intake poor to inconsistent d/t nausea  -encouraged her to push po as much as she can. 8.  Hypertension.  Coreg 12.5 mg twice daily.  Monitor with increased mobility.    Vitals:   11/22/19 1946 11/23/19 0422  BP: (!) 145/55 (!) 172/61  Pulse: (!) 56 60  Resp: 16 20  Temp: 98.7 F (37.1 C) 98.1 F (36.7 C)  SpO2: 96% 95%  BPs stabilizing  Increase amlodipine to 10mg  and decrease Coreg to daily.   11/12: BP 172/61: add lisinopril 2.5mg  9.  Diabetes mellitus peripheral neuropathy.  Hemoglobin A1c 10.1.  SSI.  Patient on Glucophage 500 mg twice daily prior to admission.  Resume as needed             CBG (last 3)  Recent Labs    11/22/19 2106 11/23/19 0613 11/23/19 1215  GLUCAP 182* 127* 130*  Cont metformin  CBGs elevated to 175 11/10:  continue to monitor 10.  Hyperlipidemia.  Lipitor 12.  Tobacco abuse.  Counseling 13.  Vestibular sx: added scopolamine patch with some improvement. Less limited  -scheduled zofran   -acclimation, OOB  -11/2 spoke to pt/daughter.discussed favorable prognosis for improvement of vestibular sx but that it may take several weeks 14.  Urinary retention still +2 total xfer will do voiding trial when transfers improve  LOS: 14 days A FACE TO FACE EVALUATION WAS PERFORMED  Clint Bolder P Sage Kopera 11/23/2019, 12:40 PM

## 2019-11-24 ENCOUNTER — Inpatient Hospital Stay (HOSPITAL_COMMUNITY): Payer: PPO

## 2019-11-24 ENCOUNTER — Inpatient Hospital Stay (HOSPITAL_COMMUNITY): Payer: PPO | Admitting: Physical Therapy

## 2019-11-24 LAB — GLUCOSE, CAPILLARY
Glucose-Capillary: 115 mg/dL — ABNORMAL HIGH (ref 70–99)
Glucose-Capillary: 208 mg/dL — ABNORMAL HIGH (ref 70–99)
Glucose-Capillary: 171 mg/dL — ABNORMAL HIGH (ref 70–99)
Glucose-Capillary: 127 mg/dL — ABNORMAL HIGH (ref 70–99)

## 2019-11-24 NOTE — Progress Notes (Signed)
[place] PHYSICAL MEDICINE & REHABILITATION PROGRESS NOTE   Subjective/Complaints:  Pt's BG was the best it's been at 115 this AM.  Had orthostatic hypotension/dizzy when standing.   LBM 3 days ago- eating prune- wants ot wait for bowel meds.   Had dried "lint" on L eyelid- irritating, but won't come off= asked her to try wet warm washcloth and soak it off.    ROS:  Pt denies SOB, abd pain, CP, N/V/C/D, and vision changes  Objective:   No results found. No results for input(s): WBC, HGB, HCT, PLT in the last 72 hours. Recent Labs    11/23/19 0502  CREATININE 0.89    Intake/Output Summary (Last 24 hours) at 11/24/2019 1407 Last data filed at 11/24/2019 0745 Gross per 24 hour  Intake 476 ml  Output 1300 ml  Net -824 ml   Physical Exam: Vital Signs Blood pressure (!) 168/59, pulse 60, temperature 98.3 F (36.8 C), temperature source Oral, resp. rate 17, height 5\' 7"  (1.702 m), weight 65.5 kg, SpO2 97 %.  General: Alert, appropriate, laying in bed- swaddled in blankets, daughter at bedside, NAD HEENT: conjugate gaze- has something like lint stuck to L lower eyelid- won't come off Heart: RRR Chest: CTA B/L- no W/R/R- good air movement Abdomen: Soft, NT, ND, (+)BS - hypoactive Extremities: No clubbing, cyanosis, or edema. Pulses are 2+ Skin: Clean and intact without signs of breakdown  Neuro: eyes opened. Follows commands. Initiates conversation and demonstrates fair insight and awareness. Poor visual acuity/depth perception when eyes are open. 4/5 RUE, LUE, LLE, RLE  Senses LT in all 4's.  Musculoskeletal:mild shoulder pain mainly at night    Assessment/Plan: 1. Functional deficits secondary to right PCA infarct which require 3+ hours per day of interdisciplinary therapy in a comprehensive inpatient rehab setting.  Physiatrist is providing close team supervision and 24 hour management of active medical problems listed below.  Physiatrist and rehab team continue to  assess barriers to discharge/monitor patient progress toward functional and medical goals  Care Tool:  Bathing    Body parts bathed by patient: Right arm, Left arm, Chest, Abdomen, Front perineal area, Right upper leg, Left upper leg, Face, Buttocks   Body parts bathed by helper: Buttocks Body parts n/a: Right lower leg, Left lower leg   Bathing assist Assist Level: Minimal Assistance - Patient > 75%     Upper Body Dressing/Undressing Upper body dressing   What is the patient wearing?: Pull over shirt    Upper body assist Assist Level: Supervision/Verbal cueing    Lower Body Dressing/Undressing Lower body dressing      What is the patient wearing?: Incontinence brief     Lower body assist Assist for lower body dressing: Minimal Assistance - Patient > 75%     Toileting Toileting    Toileting assist Assist for toileting: Moderate Assistance - Patient 50 - 74%     Transfers Chair/bed transfer  Transfers assist  Chair/bed transfer activity did not occur: Safety/medical concerns  Chair/bed transfer assist level: Moderate Assistance - Patient 50 - 74%     Locomotion Ambulation   Ambulation assist   Ambulation activity did not occur: Safety/medical concerns  Assist level: Minimal Assistance - Patient > 75% Assistive device: Walker-rolling Max distance: 90 ft   Walk 10 feet activity   Assist  Walk 10 feet activity did not occur: Safety/medical concerns  Assist level: Minimal Assistance - Patient > 75% Assistive device: Walker-rolling   Walk 50 feet activity   Assist  Walk 50 feet with 2 turns activity did not occur: Safety/medical concerns  Assist level: Minimal Assistance - Patient > 75% Assistive device: Walker-rolling    Walk 150 feet activity   Assist Walk 150 feet activity did not occur: Safety/medical concerns         Walk 10 feet on uneven surface  activity   Assist Walk 10 feet on uneven surfaces activity did not occur:  Safety/medical concerns         Wheelchair     Assist Will patient use wheelchair at discharge?: Yes Type of Wheelchair: Manual Wheelchair activity did not occur: Safety/medical concerns (pt unable to participate in [facility] assessment this date 2/2 severe nausea and vomiting and unable to transfer OOB at this time.)         Wheelchair 50 feet with 2 turns activity    Assist    Wheelchair 50 feet with 2 turns activity did not occur: Safety/medical concerns       Wheelchair 150 feet activity     Assist  Wheelchair 150 feet activity did not occur: Safety/medical concerns       Blood pressure (!) 168/59, pulse 60, temperature 98.3 F (36.8 C), temperature source Oral, resp. rate 17, height 5\' 7"  (1.702 m), weight 65.5 kg, SpO2 97 %.  Medical Problem List and Plan: 1.  Decreased functional mobility with falls loss of vision left peripheral field secondary to right PCA infarction in the setting of left vertebral artery occlusion along with severe left ICA stenosis with previously silent subacute left parietal infarct secondary to large vessel disease.             -patient may shower             -ELOS/Goals: 12-16 days/Min/Mod A      Continue CIR 2.  Antithrombotics: -DVT/anticoagulation: Lovenox             -antiplatelet therapy: Aspirin 81 mg daily and Plavix 25 mg daily x3 months then Plavix alone 3. Pain Management: Lyrica 25 mg daily, Hx post herpetic neuralgia, skin rxn to  lidocaine patch again will d/c, increase lyrica to 100 mg. Improved sleep last noc. Pain is currently well controlled   discussed calcific tendinopathy XR results with pt and daughte r- pt without movement related pain so xray finding are not cause of symptoms  11/13- no complaints of pain today- con't regimen 4. Mood: Provide emotional support, family supportive             -antipsychotic agents: N/A 5. Neuropsych: This patient is capable of making decisions on her own behalf. 6. Skin/Wound  Care: Routine skin checks 7. Fluids/Electrolytes/Nutrition: intake poor to inconsistent d/t nausea  -encouraged her to push po as much as she can. 8.  Hypertension.  Coreg 12.5 mg twice daily.  Monitor with increased mobility.    Vitals:   11/24/19 0552 11/24/19 1329  BP: (!) 155/60 (!) 168/59  Pulse: (!) 56 60  Resp: 16 17  Temp: 98.7 F (37.1 C) 98.3 F (36.8 C)  SpO2: 95% 97%  BPs stabilizing  Increase amlodipine to 10mg  and decrease Coreg to daily.   11/12: BP 172/61: add lisinopril 2.5mg   11/13- said dizzy/lightheaded with standing- might be b/c she's used ot high Bps and a more controlled BP is low for her- won't start Florinef/Midodrine  9.  Diabetes mellitus peripheral neuropathy.  Hemoglobin A1c 10.1.  SSI.  Patient on Glucophage 500 mg twice daily prior to admission.  Resume as needed  CBG (last 3)  Recent Labs    11/23/19 2134 11/24/19 0609 11/24/19 1209  GLUCAP 134* 115* 208*  Cont metformin  CBGs elevated to 175 11/10: continue to monitor  11/13- BGs 115-208- wait to increase metformin 10.  Hyperlipidemia.  Lipitor 12.  Tobacco abuse.  Counseling 13.  Vestibular sx: added scopolamine patch with some improvement. Less limited  -scheduled zofran   -acclimation, OOB  -11/2 spoke to pt/daughter.discussed favorable prognosis for improvement of vestibular sx but that it may take several weeks 14.  Urinary retention still +2 total xfer will do voiding trial when transfers improve   LOS: 15 days A FACE TO FACE EVALUATION WAS PERFORMED  Marqueta Pulley 11/24/2019, 2:07 PM

## 2019-11-24 NOTE — Progress Notes (Signed)
Occupational Therapy Session Note  Patient Details  Name: Chelsea [name] MRN: [id number] Date of Birth: 1945-02-24  Today's Date: 11/24/2019 OT Individual Time: 1345-1433 OT Individual Time Calculation (min): 48 min    Short Term Goals: Week 1:  OT Short Term Goal 1 (Week 1): Pt will sit EOB/EOM for 5 min with MOD A for sitting balance OT Short Term Goal 1 - Progress (Week 1): Met OT Short Term Goal 2 (Week 1): Pt will transfer to w/c in prep for BSC/toilet transfer wiht MAX A of 1 OT Short Term Goal 2 - Progress (Week 1): Met OT Short Term Goal 3 (Week 1): Pt will complete 1/4 steps of UB dressing OT Short Term Goal 3 - Progress (Week 1): Met OT Short Term Goal 4 (Week 1): Pt will groom seated at sink with MOD A OT Short Term Goal 4 - Progress (Week 1): Met  Skilled Therapeutic Interventions/Progress Updates:    OT session focused on standing balance and functional transfers. Pt received supine in bed with BP 162/62. Completed supine>sit with min A and BP 150/60. Completed stand pivot transfer bed>w/c with min A and cues for safety/sequencing. OT engaged pt in functional reach activity in standing to promote weight shifting, balance, and visual scanning. Pt demonstrated dynamic standing balance 3x 1-2 min with min A and increased time for scanning to L side. Returned to room, completing oral care with setup assist and stand pivot transfer w/c>bed with min A and mod cues for technique. Pt left supine in bed with all needs in reach and bed alarm on.   Therapy Documentation Precautions:  Precautions Precautions: Fall Restrictions Weight Bearing Restrictions: No General:   Vital Signs: Therapy Vitals Temp: 98.3 F (36.8 C) Temp Source: Oral Pulse Rate: 60 Resp: 17 BP: (!) 168/59 Patient Position (if appropriate): Lying Oxygen Therapy SpO2: 97 % O2 Device: Room Air Pain:   ADL: ADL Grooming: Dependent Where Assessed-Grooming: Bed level Upper Body Bathing: Maximal  assistance Where Assessed-Upper Body Bathing: Bed level Lower Body Bathing: Dependent Where Assessed-Lower Body Bathing: Bed level Upper Body Dressing: Dependent Where Assessed-Upper Body Dressing: Bed level Lower Body Dressing: Dependent Where Assessed-Lower Body Dressing: Bed level Toileting: Unable to assess Toilet Transfer: Unable to assess Vision   Perception    Praxis   Exercises:   Other Treatments:     Therapy/Group: Individual Therapy  Duayne Cal 11/24/2019, 2:10 PM

## 2019-11-24 NOTE — Progress Notes (Signed)
Physical Therapy Weekly Progress Note  Patient Details  Name: Chelsea [name] MRN: [id number] Date of Birth: Apr 03, 1945  Beginning of progress report period: November 16, 2019 End of progress report period: November 24, 2019  Today's Date: 11/24/2019 PT Individual Time: 1120-1205 PT Individual Time Calculation (min): 45 min   Patient has met 3 of 3 short term goals. Pt continues to make progress towards her STGs/LTGs as she is now able to hold midline the majority of the time and able to ambulate with RW and min assist, but inconsistently.  Patient continues to demonstrate the following deficits muscle weakness and muscle joint tightness, decreased cardiorespiratoy endurance, impaired timing and sequencing, unbalanced muscle activation, motor apraxia, ataxia, decreased coordination and decreased motor planning, field cut, decreased midline orientation, decreased attention to left and decreased motor planning, decreased attention, decreased awareness, decreased problem solving, decreased safety awareness and decreased memory and decreased standing balance, decreased postural control and decreased balance strategies and therefore will continue to benefit from skilled PT intervention to increase functional independence with mobility.  Patient progressing toward long term goals..  Continue plan of care.  PT Short Term Goals Week 3:  PT Short Term Goal 1 (Week 3): pt will ambulate 19f with min assist and LRAD PT Short Term Goal 2 (Week 3): pt will perform bed <> chair transfer with LRAD and CGA PT Short Term Goal 3 (Week 3): pt will perfrom sit<>stand with CGA  Skilled Therapeutic Interventions/Progress Updates:    Pt received sitting EOB with head elevated and leaning to L. Pt agreeable to PT and does not report any pain at this time. Pt performed stand pivot transfer to chair with min assist for AD management and cueing.   Pt vital taken to assess orthostatics prior to beginning session.   Sitting: BP 163/61; HR 58 Standing: BP 137/60; HR 60 After 2 min standing: BP 137/58; HR 61  [facility] and time management.   Pt ambulated ~566fwith RW and min assist with minimal cueing for RLE management due to inadequate weight shift to L during gait. (x2 with seated rest break in between). Pt did demonstrate significantly improved TKE and control of RLE when compared to yesterday's session.  [facility] and requesting to return to bed. Stand pivot back to bed with RW and min assist for AD management and cueing for sequencing. [facility] Pt sit > supine with CGA/min assist for RLE management due to poor follow through with instructions and sequencing. Pt left with call bell in reach, bed alarm on, and needs met.   Throughout session pt performed ~6 sit<>stands with min assist in order to perform tasks.   Therapy Documentation Precautions:  Precautions Precautions: Fall Restrictions Weight Bearing Restrictions: No   Therapy/Group: Individual Therapy  BaGaylord Shih1/13/2021, 12:11 PM

## 2019-11-25 LAB — GLUCOSE, CAPILLARY
Glucose-Capillary: 127 mg/dL — ABNORMAL HIGH (ref 70–99)
Glucose-Capillary: 209 mg/dL — ABNORMAL HIGH (ref 70–99)
Glucose-Capillary: 119 mg/dL — ABNORMAL HIGH (ref 70–99)
Glucose-Capillary: 137 mg/dL — ABNORMAL HIGH (ref 70–99)

## 2019-11-25 MED ORDER — SORBITOL 70 % SOLN
15.0000 mL | Freq: Once | Status: AC
  Administered 2019-11-25: 15 mL via ORAL
  Filled 2019-11-25: qty 30

## 2019-11-25 MED ORDER — SENNA 8.6 MG PO TABS
1.0000 | ORAL_TABLET | Freq: Every day | ORAL | Status: DC
  Administered 2019-11-25 – 2019-11-27 (×3): 8.6 mg via ORAL
  Filled 2019-11-25 (×3): qty 1

## 2019-11-25 NOTE — Progress Notes (Signed)
[place] PHYSICAL MEDICINE & REHABILITATION PROGRESS NOTE   Subjective/Complaints:   Chelsea [name] is gone from eye No BM- just got miralax dose-  Required I/o cath this AM- can't empty bladder.   Will try sorbitol but at 15cc dose, per request from family/pt- since last time "had blowout and think she got sorbitol".    ROS:   Pt denies SOB, abd pain, CP, N/V/C/D, and vision changes  Objective:   No results found. No results for input(s): WBC, HGB, HCT, PLT in the last 72 hours. Recent Labs    11/23/19 0502  CREATININE 0.89    Intake/Output Summary (Last 24 hours) at 11/25/2019 1353 Last data filed at 11/25/2019 0833 Gross per 24 hour  Intake 342 ml  Output 1950 ml  Net -1608 ml   Physical Exam: Vital Signs Blood pressure (!) 144/60, pulse 60, temperature 98.2 F (36.8 C), resp. rate 18, height 5\' 7"  (1.702 m), weight 65.5 kg, SpO2 94 %.  General: awake, alert, sitting in bedside chair, NAD -family member at bedside HEENT: nothing on eyelid this AM Heart: RRR Chest: CTA B/L- no W/R/R- good air movement Abdomen: Ssoft, but somewhat distended; hypoactive BS; NT Extremities: No clubbing, cyanosis, or edema. Pulses are 2+ Skin: Clean and intact without signs of breakdown  Neuro: eyes opened. Follows commands. Initiates conversation and demonstrates fair insight and awareness. Poor visual acuity/depth perception when eyes are open. 4/5 RUE, LUE, LLE, RLE  Senses LT in all 4's.  Musculoskeletal:mild shoulder pain mainly at night    Assessment/Plan: 1. Functional deficits secondary to right PCA infarct which require 3+ hours per day of interdisciplinary therapy in a comprehensive inpatient rehab setting.  Physiatrist is providing close team supervision and 24 hour management of active medical problems listed below.  Physiatrist and rehab team continue to assess barriers to discharge/monitor patient progress toward functional and medical goals  Care Tool:  Bathing     Body parts bathed by patient: Right arm, Left arm, Chest, Abdomen, Front perineal area, Right upper leg, Left upper leg, Face, Buttocks   Body parts bathed by helper: Buttocks Body parts n/a: Right lower leg, Left lower leg   Bathing assist Assist Level: Minimal Assistance - Patient > 75%     Upper Body Dressing/Undressing Upper body dressing   What is the patient wearing?: Pull over shirt    Upper body assist Assist Level: Supervision/Verbal cueing    Lower Body Dressing/Undressing Lower body dressing      What is the patient wearing?: Incontinence brief     Lower body assist Assist for lower body dressing: Minimal Assistance - Patient > 75%     Toileting Toileting    Toileting assist Assist for toileting: Moderate Assistance - Patient 50 - 74%     Transfers Chair/bed transfer  Transfers assist  Chair/bed transfer activity did not occur: Safety/medical concerns  Chair/bed transfer assist level: Moderate Assistance - Patient 50 - 74%     Locomotion Ambulation   Ambulation assist   Ambulation activity did not occur: Safety/medical concerns  Assist level: Minimal Assistance - Patient > 75% Assistive device: Walker-rolling Max distance: 90 ft   Walk 10 feet activity   Assist  Walk 10 feet activity did not occur: Safety/medical concerns  Assist level: Minimal Assistance - Patient > 75% Assistive device: Walker-rolling   Walk 50 feet activity   Assist Walk 50 feet with 2 turns activity did not occur: Safety/medical concerns  Assist level: Minimal Assistance - Patient > 75% Assistive  device: Walker-rolling    Walk 150 feet activity   Assist Walk 150 feet activity did not occur: Safety/medical concerns         Walk 10 feet on uneven surface  activity   Assist Walk 10 feet on uneven surfaces activity did not occur: Safety/medical concerns         Wheelchair     Assist Will patient use wheelchair at discharge?: Yes Type of  Wheelchair: Manual Wheelchair activity did not occur: Safety/medical concerns (pt unable to participate in [facility] assessment this date 2/2 severe nausea and vomiting and unable to transfer OOB at this time.)         Wheelchair 50 feet with 2 turns activity    Assist    Wheelchair 50 feet with 2 turns activity did not occur: Safety/medical concerns       Wheelchair 150 feet activity     Assist  Wheelchair 150 feet activity did not occur: Safety/medical concerns       Blood pressure (!) 144/60, pulse 60, temperature 98.2 F (36.8 C), resp. rate 18, height 5\' 7"  (1.702 m), weight 65.5 kg, SpO2 94 %.  Medical Problem List and Plan: 1.  Decreased functional mobility with falls loss of vision left peripheral field secondary to right PCA infarction in the setting of left vertebral artery occlusion along with severe left ICA stenosis with previously silent subacute left parietal infarct secondary to large vessel disease.             -patient may shower             -ELOS/Goals: 12-16 days/Min/Mod A      Continue CIR 2.  Antithrombotics: -DVT/anticoagulation: Lovenox             -antiplatelet therapy: Aspirin 81 mg daily and Plavix 25 mg daily x3 months then Plavix alone 3. Pain Management: Lyrica 25 mg daily, Hx post herpetic neuralgia, skin rxn to  lidocaine patch again will d/c, increase lyrica to 100 mg. Improved sleep last noc. Pain is currently well controlled   discussed calcific tendinopathy XR results with pt and daughte r- pt without movement related pain so xray finding are not cause of symptoms  11/13- no complaints of pain today- con't regimen 4. Mood: Provide emotional support, family supportive             -antipsychotic agents: N/A 5. Neuropsych: This patient is capable of making decisions on her own behalf. 6. Skin/Wound Care: Routine skin checks 7. Fluids/Electrolytes/Nutrition: intake poor to inconsistent d/t nausea  -encouraged her to push po as much as she  can. 8.  Hypertension.  Coreg 12.5 mg twice daily.  Monitor with increased mobility.    Vitals:   11/25/19 0856 11/25/19 0900  BP: (!) 144/60   Pulse:  60  Resp:    Temp:    SpO2:    BPs stabilizing  Increase amlodipine to 10mg  and decrease Coreg to daily.   11/12: BP 172/61: add lisinopril 2.5mg   11/13- said dizzy/lightheaded with standing- might be b/c she's used ot high Bps and a more controlled BP is low for her- won't start Florinef/Midodrine  1114- BP a little high 144/60- con't regimen 9.  Diabetes mellitus peripheral neuropathy.  Hemoglobin A1c 10.1.  SSI.  Patient on Glucophage 500 mg twice daily prior to admission.  Resume as needed             CBG (last 3)  Recent Labs    11/24/19 2129 11/25/19 0606  11/25/19 1131  GLUCAP 127* 127* 209*  Cont metformin  CBGs elevated to 175 11/10: continue to monitor  11/13- BGs 115-208- wait to increase metformin  11/14- same levels of BGs- con't- might increase metformin? 10.  Hyperlipidemia.  Lipitor 12.  Tobacco abuse.  Counseling 13.  Vestibular sx: added scopolamine patch with some improvement. Less limited  -scheduled zofran   -acclimation, OOB  -11/2 spoke to pt/daughter.discussed favorable prognosis for improvement of vestibular sx but that it may take several weeks 14.  Urinary retention still +2 total xfer will do voiding trial when transfers improve  11/14- said getting in/out caths- could benefit from Flomax? 15. Constipation  11/14- LBM 4 days ago when cleaned out- will try Sorbitol 15cc- per pt/family request- added senokot to bowel meds.      LOS: 16 days A FACE TO FACE EVALUATION WAS PERFORMED  Deriyah Kunath 11/25/2019, 1:53 PM

## 2019-11-26 ENCOUNTER — Inpatient Hospital Stay (HOSPITAL_COMMUNITY): Payer: PPO | Admitting: Occupational Therapy

## 2019-11-26 ENCOUNTER — Inpatient Hospital Stay (HOSPITAL_COMMUNITY): Payer: PPO | Admitting: Physical Therapy

## 2019-11-26 LAB — GLUCOSE, CAPILLARY
Glucose-Capillary: 147 mg/dL — ABNORMAL HIGH (ref 70–99)
Glucose-Capillary: 154 mg/dL — ABNORMAL HIGH (ref 70–99)
Glucose-Capillary: 123 mg/dL — ABNORMAL HIGH (ref 70–99)
Glucose-Capillary: 124 mg/dL — ABNORMAL HIGH (ref 70–99)

## 2019-11-26 MED ORDER — TERAZOSIN HCL 1 MG PO CAPS
1.0000 mg | ORAL_CAPSULE | Freq: Every day | ORAL | Status: DC
  Administered 2019-11-26 – 2019-11-29 (×4): 1 mg via ORAL
  Filled 2019-11-26 (×4): qty 1

## 2019-11-26 NOTE — Progress Notes (Addendum)
Physical Therapy Session Note  Patient Details  Name: Chelsea [name] MRN: [id number] Date of Birth: [birth year]  Today's Date: 11/26/2019 PT Individual Time: 0805-0900 and [id number]  PT Individual Time Calculation (min): 55 min and 62 min  Short Term Goals: Week 3:  PT Short Term Goal 1 (Week 3): pt will ambulate 158f with min assist and LRAD PT Short Term Goal 2 (Week 3): pt will perform bed <> chair transfer with LRAD and CGA PT Short Term Goal 3 (Week 3): pt will perfrom sit<>stand with CGA  Skilled Therapeutic Interventions/Progress Updates: Tx1: Pt presented sitting EOB with dgt [name] present agreeable to therapy. Pt denies pain at start of session, indicated some intermittent pain in L shoulder during session but resolved with rest. Pt and dgt happy that pt voided on BSC this am! Pt sat at EOB as nsg arrived and provided with am meds. Pt then performed stand pivot transfer with minA to w/c. Pt then transported to rehab gym and pt participated in ambulation with RW 555fand 4053fith seated rest. Pt initially demonstrating good sequencing, step length, and TKE of RLE in stance phase however as pt became more fatigued noted increased R knee instability and pt requiring cues for increased step length with RLE. Participated in STS x 5 with RW with CGA but requiring cues for hand placement with sitting and to avoid "falling" to mat due to lack of eccentric control. Pt then performed ambulatory transfer to w/c with minA and transported back to room. Pt then performed stand pivot [facility] [name] providing CGA to initiate hands on family ed. Pt was unable to void and performed ambulatory transfer to bed with dgt providing appropriate cues. Pt performed sit to supine with CGA and increased time and effort. Pt repositioned self to comfort and left with bed alarm on, call bell within reach and current needs met.   Tx2: Pt presented in bed with dgt present agreeable to therapy.  Pt c/o pain in L shoulder which pt and dgt contributed to tugging on arm during BSC transfer earlier in day. Did not require intervention at start of session but increased throughout session and RN notified at end of session. Session focused on hands on training for transfers and mobility in preparation for d/c. Pt performed supine to sit with CGA, use of bed features and increased time. Pt then performed stand pivot transfer with CGA from dgt with appropriate cues provided. Pt then transported to rehab gym and performed block practice transfers w/c to/from mat with dgt. Pt was able to consistently perform with CGA and dgt providing cues for posture, safety with RW, and sequencing. All cues were appropriate. Pt and dgt then ambulated approx 22f64fth RW and minA. Pt required cues for safety with RW and for TKE with wt shifting to R. Pt noted to take small steps with decreased foot clearance which did improve with verbal cues. During seated rest PTA provided dgt feedback regarding positioning and cueing which dgt was receptive to. Pt then ambulated an additional 10ft92freturn to w/c with dgt providing minA. Pt then ambulated additional 65ft 38f RW and minA nearing CGA in highly distracting environment. Pt required cues for erect posture, staying close proximity to RW, and correcting R lateral lean which pt was able to do with verbal cues only. Pt transported remaining distance back to room and performed stand pivot transfer back to bed with dgt leading transfer. Due to multiple transfers in  quiet and distracting environment dgt [name] signed off for bed to/from [facility] only. Nsg notified of change. Pt required minA for BLE management when returning to supine. Once completed pt left in bed with bed alarm on, call bell within reach and current needs met.       Therapy Documentation Precautions:  Precautions Precautions: Fall Restrictions Weight Bearing Restrictions: No General:   Vital Signs: Therapy  Vitals Temp: 97.8 F (36.6 C) Temp Source: Oral Pulse Rate: (!) 57 Resp: 19 BP: (!) 156/63 Patient Position (if appropriate): Lying Oxygen Therapy SpO2: 97 % O2 Device: Room Air    Therapy/Group: Individual Therapy  Karleigh Bunte  Ellouise Mcwhirter, PTA  11/26/2019, 3:55 PM

## 2019-11-26 NOTE — Progress Notes (Signed)
Occupational Therapy Weekly Progress Note  Patient Details  Name: Chelsea [name] MRN: [id number] Date of Birth: 1945-06-30  Beginning of progress report period: November 16, 2019 End of progress report period: November 26, 2019  Today's Date: 11/26/2019 OT Individual Time: 1047-1200 OT Individual Time Calculation (min): 73 min    Patient has met 4 of 4 short term goals. Patient making steady progress toward goals demonstrating squat-pivot transfers with Min A to Min guard assist,  And stand-pivot transfers to walk-in shower and commode with Min to Mod A. Patient continues to require multimodal cues for focused attention with modifications to the environment to decrease distraction. Daughter is a very active part of the patients recovery with willingness to learn and assist patient as needed. Upon initial evaluation, patient was requiring up to Mod A to maintain static sitting balance. Patient currently able to don footwear seated EOB and thread BLE through LB clothing without LOB. Patient also able to don footwear with ability to attain and maintain figure-4 position bilaterally. Mild setback during this reporting with frequent loose stools but patient has since recovered. Patient also tolerating full OT sessions up to 75 minutes with focus on OOB activity.    Patient continues to demonstrate the following deficits: muscle weakness,decreased cardiorespiratoy endurance,impaired timing and sequencing, unbalanced muscle activation, motor apraxia, decreased coordination and decreased motor planning,decreased visual acuity and decreased visual perceptual skills,decreased motor planning,decreased attention, decreased awareness, decreased problem solving, decreased safety awareness, decreased memory and delayed processingand decreased sitting balance, decreased standing balance, decreased postural control, hemiplegia and decreased balance strategiesand therefore will continue to benefit from skilled  OT intervention to enhance overall performance with BADL and Reduce care partner burden.  Patient progressing toward long term goals..  Continue plan of care.  OT Short Term Goals Week 2:  OT Short Term Goal 1 (Week 2): Patient will complete squat-pivot transfers with Mod A. OT Short Term Goal 1 - Progress (Week 2): Met OT Short Term Goal 2 (Week 2): Patient will thread BLE through LB clothing seated EOB. OT Short Term Goal 2 - Progress (Week 2): Met OT Short Term Goal 3 (Week 2): Patient will tolerate OOB activity for 1hr in prep for return to PLOF. OT Short Term Goal 3 - Progress (Week 2): Met OT Short Term Goal 4 (Week 2): Patient will demonstrate dynamic sitting balance with Min A in prep for LB BADLs. OT Short Term Goal 4 - Progress (Week 2): Met Week 3:  OT Short Term Goal 1 (Week 3): STG = LTG 2/2 ELOS  Skilled Therapeutic Interventions/Progress Updates:  Patient met lying supine in bed in agreement with OT treatment session. 2/10 chronic pain in L shoulder. Patient premedicated. Repositioning and distraction for pain management. Daughter and patient completed functional mobility to commode in bathroom, walk-in shower transfer, and UB/LB bathing. OT education on appropriate cueing for patient including proximity to RW and R attention primarily in RLE at the knee. Patient/family also educated on decreasing external distractions during ambulation as patient continues to demonstrate decreased focused and alternating attention. Daughter with questions on recommended DME. All questions answered to patient/family satisfaction. With functional mobility from bathroom to sink, patient became anxious 2/2 fear of falling. Education on pausing to regroup with daughter able to talk patient through relaxation techniques. Seated at sink, patient completed oral hygiene with set-up assist. Session concluded with patient lying supine in bed with call bell within reach, bed alarm activated, and all needs met.    Therapy Documentation Precautions:  Precautions Precautions: Fall Restrictions Weight Bearing Restrictions: No General:    Therapy/Group: Individual Therapy  Chelsea [name] 11/26/2019, 7:33 AM

## 2019-11-26 NOTE — Progress Notes (Signed)
[place] PHYSICAL MEDICINE & REHABILITATION PROGRESS NOTE   Subjective/Complaints:  Voided this am, otherwise has required ICP  ROS:   Pt denies SOB, abd pain, CP, N/V/C/D, and vision changes  Objective:   No results found. No results for input(s): WBC, HGB, HCT, PLT in the last 72 hours. No results for input(s): NA, K, CL, CO2, GLUCOSE, BUN, CREATININE, CALCIUM in the last 72 hours.  Intake/Output Summary (Last 24 hours) at 11/26/2019 0852 Last data filed at 11/26/2019 0842 Gross per 24 hour  Intake 380 ml  Output 1850 ml  Net -1470 ml   Physical Exam: Vital Signs Blood pressure (!) 162/61, pulse (!) 56, temperature 98.3 F (36.8 C), resp. rate 18, height 5\' 7"  (1.702 m), weight 65.5 kg, SpO2 96 %.   General: No acute distress Mood and affect are appropriate Heart: Regular rate and rhythm no rubs murmurs or extra sounds Lungs: Clear to auscultation, breathing unlabored, no rales or wheezes Abdomen: Positive bowel sounds, soft nontender to palpation, nondistended Extremities: No clubbing, cyanosis, or edema Skin: No evidence of breakdown, no evidence of rash  Neuro: eyes opened. Follows commands. Initiates conversation and demonstrates fair insight and awareness. Poor visual acuity/depth perception when eyes are open. 4/5 RUE, LUE, LLE, RLE  Senses LT in all 4's.  Musculoskeletal:mild shoulder pain mainly at night    Assessment/Plan: 1. Functional deficits secondary to right PCA infarct which require 3+ hours per day of interdisciplinary therapy in a comprehensive inpatient rehab setting.  Physiatrist is providing close team supervision and 24 hour management of active medical problems listed below.  Physiatrist and rehab team continue to assess barriers to discharge/monitor patient progress toward functional and medical goals  Care Tool:  Bathing    Body parts bathed by patient: Right arm, Left arm, Chest, Abdomen, Front perineal area, Right upper leg, Left  upper leg, Face, Buttocks   Body parts bathed by helper: Buttocks Body parts n/a: Right lower leg, Left lower leg   Bathing assist Assist Level: Minimal Assistance - Patient > 75%     Upper Body Dressing/Undressing Upper body dressing   What is the patient wearing?: Pull over shirt    Upper body assist Assist Level: Supervision/Verbal cueing    Lower Body Dressing/Undressing Lower body dressing      What is the patient wearing?: Incontinence brief     Lower body assist Assist for lower body dressing: Minimal Assistance - Patient > 75%     Toileting Toileting    Toileting assist Assist for toileting: Moderate Assistance - Patient 50 - 74%     Transfers Chair/bed transfer  Transfers assist  Chair/bed transfer activity did not occur: Safety/medical concerns  Chair/bed transfer assist level: Moderate Assistance - Patient 50 - 74%     Locomotion Ambulation   Ambulation assist   Ambulation activity did not occur: Safety/medical concerns  Assist level: Minimal Assistance - Patient > 75% Assistive device: Walker-rolling Max distance: 90 ft   Walk 10 feet activity   Assist  Walk 10 feet activity did not occur: Safety/medical concerns  Assist level: Minimal Assistance - Patient > 75% Assistive device: Walker-rolling   Walk 50 feet activity   Assist Walk 50 feet with 2 turns activity did not occur: Safety/medical concerns  Assist level: Minimal Assistance - Patient > 75% Assistive device: Walker-rolling    Walk 150 feet activity   Assist Walk 150 feet activity did not occur: Safety/medical concerns         Walk 10  feet on uneven surface  activity   Assist Walk 10 feet on uneven surfaces activity did not occur: Safety/medical concerns         Wheelchair     Assist Will patient use wheelchair at discharge?: Yes Type of Wheelchair: Manual Wheelchair activity did not occur: Safety/medical concerns (pt unable to participate in [facility]  assessment this date 2/2 severe nausea and vomiting and unable to transfer OOB at this time.)         Wheelchair 50 feet with 2 turns activity    Assist    Wheelchair 50 feet with 2 turns activity did not occur: Safety/medical concerns       Wheelchair 150 feet activity     Assist  Wheelchair 150 feet activity did not occur: Safety/medical concerns       Blood pressure (!) 162/61, pulse (!) 56, temperature 98.3 F (36.8 C), resp. rate 18, height 5\' 7"  (1.702 m), weight 65.5 kg, SpO2 96 %.  Medical Problem List and Plan: 1.  Decreased functional mobility with falls loss of vision left peripheral field secondary to right PCA infarction in the setting of left vertebral artery occlusion along with severe left ICA stenosis with previously silent subacute left parietal infarct secondary to large vessel disease.             -patient may shower             -ELOS/Goals: 12-16 days/Min/Mod A      Continue CIR 2.  Antithrombotics: -DVT/anticoagulation: Lovenox             -antiplatelet therapy: Aspirin 81 mg daily and Plavix 25 mg daily x3 months then Plavix alone 3. Pain Management: Lyrica 25 mg daily, Hx post herpetic neuralgia, skin rxn to  lidocaine patch again will d/c, increase lyrica to 100 mg. Improved sleep last noc. Pain is currently well controlled- sleeping well    discussed calcific tendinopathy XR results with pt and daughte r- pt without movement related pain so xray finding are not cause of symptoms  11/13- no complaints of pain today- con't regimen 4. Mood: Provide emotional support, family supportive             -antipsychotic agents: N/A 5. Neuropsych: This patient is capable of making decisions on her own behalf. 6. Skin/Wound Care: Routine skin checks 7. Fluids/Electrolytes/Nutrition: intake poor to inconsistent d/t nausea  -encouraged her to push po as much as she can. 8.  Hypertension.  Coreg 12.5 mg twice daily.  Monitor with increased mobility.     Vitals:   11/25/19 1949 11/26/19 0508  BP: (!) 142/51 (!) 162/61  Pulse: 60 (!) 56  Resp: 17 18  Temp: 98.4 F (36.9 C) 98.3 F (36.8 C)  SpO2: 94% 96%  BPs stabilizing  Increase amlodipine to 10mg  and decrease Coreg to daily.   11/12: BP 172/61: add lisinopril 2.5mg   11/15 adding hytrin for bladder will need to mionitor for orthostasis  9.  Diabetes mellitus peripheral neuropathy.  Hemoglobin A1c 10.1.  SSI.  Patient on Glucophage 500 mg twice daily prior to admission.  Resume as needed             CBG (last 3)  Recent Labs    11/25/19 1622 11/25/19 2057 11/26/19 0617  GLUCAP 119* 137* 147*  Cont metformin Well controlled  10.  Hyperlipidemia.  Lipitor 12.  Tobacco abuse.  Counseling 13.  Vestibular sx: added scopolamine patch with some improvement. Less limited  -scheduled zofran   -  acclimation, OOB  -11/2 spoke to pt/daughter.discussed favorable prognosis for improvement of vestibular sx but that it may take several weeks 14.  Urinary retention- now min A , voiding trial , requires I/O cath , start Hytrin given sulfa allergy  15. Constipation  11/14- LBM 4 days ago when cleaned out- will try Sorbitol 15cc- per pt/family request- added senokot to bowel meds.      LOS: 17 days A FACE TO FACE EVALUATION WAS PERFORMED  Erick Colace 11/26/2019, 8:52 AM

## 2019-11-27 ENCOUNTER — Inpatient Hospital Stay (HOSPITAL_COMMUNITY): Payer: PPO | Admitting: Occupational Therapy

## 2019-11-27 ENCOUNTER — Inpatient Hospital Stay (HOSPITAL_COMMUNITY): Payer: PPO | Admitting: Physical Therapy

## 2019-11-27 DIAGNOSIS — I6522 Occlusion and stenosis of left carotid artery: Secondary | ICD-10-CM

## 2019-11-27 LAB — GLUCOSE, CAPILLARY
Glucose-Capillary: 171 mg/dL — ABNORMAL HIGH (ref 70–99)
Glucose-Capillary: 146 mg/dL — ABNORMAL HIGH (ref 70–99)
Glucose-Capillary: 119 mg/dL — ABNORMAL HIGH (ref 70–99)
Glucose-Capillary: 124 mg/dL — ABNORMAL HIGH (ref 70–99)

## 2019-11-27 MED ORDER — SORBITOL 70 % SOLN
30.0000 mL | Freq: Every day | Status: DC | PRN
  Administered 2019-11-27: 30 mL via ORAL
  Filled 2019-11-27: qty 30

## 2019-11-27 MED ORDER — ENSURE MAX PROTEIN PO LIQD
11.0000 [oz_av] | Freq: Every day | ORAL | Status: DC
  Administered 2019-11-27 – 2019-11-30 (×3): 11 [oz_av] via ORAL
  Filled 2019-11-27: qty 330

## 2019-11-27 NOTE — Progress Notes (Signed)
Nutrition Follow-up  RD working remotely.  DOCUMENTATION CODES:   Not applicable  INTERVENTION:   - Trial Ensure Max po daily, each supplement provides 150 kcal and 30 grams of protein  -d/c Boost Breeze due to continued refusal and pt reports of nausea when consumed  -ContinueMVI with minerals daily  - Encourage adequate PO intake  NUTRITION DIAGNOSIS:   Inadequate oral intake related to nausea, vomiting as evidenced by meal completion < 50%.  Progressing  GOAL:   Patient will meet greater than or equal to 90% of their needs  Progressing  MONITOR:   PO intake, Supplement acceptance, Labs, Weight trends, Skin, I & O's  REASON FOR ASSESSMENT:   Malnutrition Screening Tool    ASSESSMENT:   Chelsea Berkey. [name] is a 74 year old right-handed female with history of hypertension, diabetes mellitus, left carotid stenosis 80% was scheduled for carotid enterectomy in late November with Dr. Arbie Cookey, tobacco abuse,  medical noncompliance.  Per chart review independent with assistive device prior to admission.  Noted target d/c date of 10/23.  Spoke with pt's daughter via phone call to room. Pt eating lunch at time of phone call so was not able to speak with her.  Pt's daughter reports that pt continues to have difficulty with meals. Pt's daughter states that pt is currently eating chicken and potatoes and "gagging" due to nausea. Pt's daughter also reports that pt's blood sugars have been "up and down." She is trying to figure out a plan for managing pt's blood sugars at home.  Pt has not been consuming Boost Breeze. Discussed trial of Ensure Max with pt's daughter who was amenable to this plan. Discussed importance of adequate kcal and protein intake in healing and maintaining lean muscle mass.  CIR admit weight: 62 kg Current weight: 65.5 kg  Meal Completion: 30-100%  Medications reviewed and include: Boost Breeze TID (pt refusing), colace, SSI, metformin, MVI with  minerals, zofran 4 mg q 12 hours, miralax, senna  Labs reviewed. CBGs: 123-171 x 24 hours  Diet Order:   Diet Order            Diet regular Room service appropriate? Yes; Fluid consistency: Thin  Diet effective now                 EDUCATION NEEDS:   Education needs have been addressed  Skin:  Skin Assessment: Reviewed RN Assessment  Last BM:  11/22/19  Height:   Ht Readings from Last 1 Encounters:  11/13/19 5\' 7"  (1.702 m)    Weight:   Wt Readings from Last 1 Encounters:  11/24/19 65.5 kg    Ideal Body Weight:  61.4 kg  BMI:  Body mass index is 22.62 kg/m.  Estimated Nutritional Needs:   Kcal:  1850-2050  Protein:  95-110 grams  Fluid:  > 1.8 L    11/26/19, MS, RD, LDN Inpatient Clinical Dietitian Please see [facility] for contact information.

## 2019-11-27 NOTE — Progress Notes (Signed)
Pt's LBM is 11/11 or 11/12, the daughter and pt says they knows it was one of those dates. Pt was given suppository at 0730, and sorbitol at 1433 on day shift w/ no results. Pt was given scheduled colace at 1945 this shift. Abdomen is soft, obese/bloated but non-tender; passing gas and bowel sounds are active in all 4 quadrants. Pt wants to try suppository or sorbitol again in the AM.   Bridgett Larsson LPN

## 2019-11-27 NOTE — Progress Notes (Signed)
Occupational Therapy Session Note  Patient Details  Name: Chelsea [name] MRN: [id number] Date of Birth: 08-Apr-1945  Today's Date: 11/27/2019 OT Individual Time: 2297-9892 OT Individual Time Calculation (min): 60 min   OT Individual Time: 1194-1740 OT Individual Time Calculation (min): 46 min    Short Term Goals: Week 3:  OT Short Term Goal 1 (Week 3): STG = LTG 2/2 ELOS  Skilled Therapeutic Interventions/Progress Updates:  Session 1: Patient met lying supine in bed with daughter present at bedside. Mild chronic pain in L shoulder (premedicated). Kpad also in place for pain management upon OT entry. Daughter notes completion of toileitng with patient throughout the night without significant event using RW and BSC. Patient able to void bladder multiple times yesterday. Patient notes receiving a suppository 15 minutes prior to OT entry with request to remain in room this treatment session. Supine to EOB with Min guard and increased time and anterior scoot toward EOB with close supervision A. Patient able to maintain sitting balance at EOB for >15 min without LOB. With assist from daughter, patient ambulated from EOB to wc positioned ~20f away with use of RW. Daughter able to appropriately cue patient. Total A for wc transport room <> ADL apartment. Patient/family education on set-up of TTB at home. Education also provided on use of LAllison grab bars, and non-slip surface in and outside of tub to decrease risk of falls. OT demonstrated tub/shower transfer with use of TTB. Patient/family expressed verbal understanding with demonstration to occur at next therapy session. Patient with request to use commode in bathroom. Daughter able to assist patient with toileting/hygiene/clothing management with appropriate cues. Session concluded with [facility] with daughter present to assist. Daughter signed off on toilet transfers on 11/15.    Session 2: Session with focus on OOB activity,  functional transfers, and [facility] Patient/family able to demonstrate both stand-pivot transfers with RW and squat-pivot transfers from wc <> EOB with appropriate cueing. Total A for wc transport to ortho gym in prep for therapeutic exercise. Patient in agreement with attempting BUE arm ergometer to improve strength. Patient c/o increase in pain in L shoulder but in agreement with completing task with use of RUE only. Patient able to tolerate 3 min without resistance and RPM of 15-20. Patient reports enjoying crafts, cooking, and writing. Wc transport to therapy gym. At high/low table, patient engaged in [facility] writing task. Patient noted to demonstrate increased spacing between letters/words, and attempting to write off the paper. Per report from daughter, writing was once better than it was this date. Patient tasked with keeping a written journal every few days to work on writing. Assist for wc transport back to room. Session concluded with patient lying supine in bed with all needs met and daughter present at bedside.   Therapy Documentation Precautions:  Precautions Precautions: Fall Restrictions Weight Bearing Restrictions: No General:    Therapy/Group: Individual Therapy  Angeliz Settlemyre R Howerton-Davis 11/27/2019, 7:24 AM

## 2019-11-27 NOTE — Progress Notes (Signed)
Patient ID: Chelsea [name], female   DOB: 19-Mar-1945, 74 y.o.   MRN: [id number]  Progress Note    11/27/2019 3:51 PM * No surgery found *  Subjective: Uncomfortable today due to constipation.  This has been a recurrent problem for her.  Making progress in rehab with her posterior stroke.  Her daughter is present in her room today for discussion as well.   Vitals:   11/27/19 0422 11/27/19 1438  BP: (!) 129/56 (!) 157/65  Pulse: (!) 58 (!) 59  Resp: 16 19  Temp: 98.1 F (36.7 C) 97.9 F (36.6 C)  SpO2: 93% 98%   Physical Exam: No change  CBC    Component Value Date/Time   WBC 10.5 11/12/2019 1009   RBC 3.88 11/12/2019 1009   HGB 12.3 11/12/2019 1009   HCT 36.8 11/12/2019 1009   PLT 322 11/12/2019 1009   MCV 94.8 11/12/2019 1009   MCH 31.7 11/12/2019 1009   MCHC 33.4 11/12/2019 1009   RDW 12.2 11/12/2019 1009   LYMPHSABS 2.4 11/12/2019 1009   MONOABS 0.6 11/12/2019 1009   EOSABS 0.2 11/12/2019 1009   BASOSABS 0.1 11/12/2019 1009    BMET    Component Value Date/Time   NA 135 11/12/2019 1009   K 4.2 11/12/2019 1009   CL 98 11/12/2019 1009   CO2 26 11/12/2019 1009   GLUCOSE 225 (H) 11/12/2019 1009   BUN 19 11/12/2019 1009   CREATININE 0.89 11/23/2019 0502   CALCIUM 8.9 11/12/2019 1009   GFRNONAA >60 11/23/2019 0502   GFRAA  09/17/2008 1612    >60        The eGFR has been calculated using the MDRD equation. This calculation has not been validated in all clinical situations. eGFR's persistently <60 mL/min signify possible Chronic Kidney Disease.    INR    Component Value Date/Time   INR 1.0 11/08/2019 0406     Intake/Output Summary (Last 24 hours) at 11/27/2019 1551 Last data filed at 11/27/2019 1300 Gross per 24 hour  Intake 720 ml  Output 225 ml  Net 495 ml     Assessment/Plan:  74 y.o. female known severe left internal carotid artery stenosis which is felt to be asymptomatic.  Admitted with posterior circulation stroke on the right.  In  rehab and recovering.  I again discussed options for left carotid endarterectomy for severe asymptomatic disease at the end of her rehab stay versus discharge to home and then readmission at a later date.  Both the patient and her daughter strongly prefer endarterectomy prior to discharge.  Current plan is for her to be discharged [facility] next week.  I am out of town next week.  I did explain to the patient and her daughter that it would be one of my partners doing the surgery if she elects to have surgery and I certainly feel that this would be appropriate.  Dr. Scot Dock is available for surgery on Monday, 12/03/2019.  We will tentatively plan on surgery this date.  Also discussed this with Dr.Kirsteins.  We will plan discharge from rehab on Monday with admission [facility] with surgery on Monday     Rosetta Posner, MD [facility] [phone] 11/27/2019 3:51 PM

## 2019-11-27 NOTE — Progress Notes (Signed)
Patient ID: Chelsea [name], female   DOB: 06/09/1945, 74 y.o.   MRN: [id number]   Home care agency list provided to daughter.  [place], [place] [phone]

## 2019-11-27 NOTE — Progress Notes (Signed)
[place] PHYSICAL MEDICINE & REHABILITATION PROGRESS NOTE   Subjective/Complaints:    ROS:   Pt denies SOB, abd pain, CP, N/V/C/D, and vision changes  Objective:   No results found. No results for input(s): WBC, HGB, HCT, PLT in the last 72 hours. No results for input(s): NA, K, CL, CO2, GLUCOSE, BUN, CREATININE, CALCIUM in the last 72 hours.  Intake/Output Summary (Last 24 hours) at 11/27/2019 1104 Last data filed at 11/27/2019 0847 Gross per 24 hour  Intake 720 ml  Output 225 ml  Net 495 ml   Physical Exam: Vital Signs Blood pressure (!) 129/56, pulse (!) 58, temperature 98.1 F (36.7 C), temperature source Axillary, resp. rate 16, height 5\' 7"  (1.702 m), weight 65.5 kg, SpO2 93 %.   General: No acute distress Mood and affect are appropriate Heart: Regular rate and rhythm no rubs murmurs or extra sounds Lungs: Clear to auscultation, breathing unlabored, no rales or wheezes Abdomen: Positive bowel sounds, soft nontender to palpation, nondistended Extremities: No clubbing, cyanosis, or edema Skin: No evidence of breakdown, no evidence of rash  Neuro: eyes opened. Follows commands. Initiates conversation and demonstrates fair insight and awareness. Poor visual acuity/depth perception when eyes are open. 4/5 RUE, LUE, LLE, RLE  Senses LT in all 4's.  Musculoskeletal:mild shoulder pain mainly at night    Assessment/Plan: 1. Functional deficits secondary to right PCA infarct which require 3+ hours per day of interdisciplinary therapy in a comprehensive inpatient rehab setting.  Physiatrist is providing close team supervision and 24 hour management of active medical problems listed below.  Physiatrist and rehab team continue to assess barriers to discharge/monitor patient progress toward functional and medical goals  Care Tool:  Bathing    Body parts bathed by patient: Right arm, Left arm, Chest, Abdomen, Front perineal area, Right upper leg, Left upper leg, Face,  Buttocks   Body parts bathed by helper: Buttocks Body parts n/a: Right lower leg, Left lower leg   Bathing assist Assist Level: Minimal Assistance - Patient > 75%     Upper Body Dressing/Undressing Upper body dressing   What is the patient wearing?: Pull over shirt    Upper body assist Assist Level: Supervision/Verbal cueing    Lower Body Dressing/Undressing Lower body dressing      What is the patient wearing?: Incontinence brief     Lower body assist Assist for lower body dressing: Minimal Assistance - Patient > 75%     Toileting Toileting    Toileting assist Assist for toileting: Minimal Assistance - Patient > 75%     Transfers Chair/bed transfer  Transfers assist  Chair/bed transfer activity did not occur: Safety/medical concerns  Chair/bed transfer assist level: Minimal Assistance - Patient > 75%     Locomotion Ambulation   Ambulation assist   Ambulation activity did not occur: Safety/medical concerns  Assist level: Minimal Assistance - Patient > 75% Assistive device: Walker-rolling Max distance: 25ft   Walk 10 feet activity   Assist  Walk 10 feet activity did not occur: Safety/medical concerns  Assist level: Minimal Assistance - Patient > 75% Assistive device: Walker-rolling   Walk 50 feet activity   Assist Walk 50 feet with 2 turns activity did not occur: Safety/medical concerns  Assist level: Minimal Assistance - Patient > 75% Assistive device: Walker-rolling    Walk 150 feet activity   Assist Walk 150 feet activity did not occur: Safety/medical concerns         Walk 10 feet on uneven surface  activity  Assist Walk 10 feet on uneven surfaces activity did not occur: Safety/medical concerns         Wheelchair     Assist Will patient use wheelchair at discharge?: Yes Type of Wheelchair: Manual Wheelchair activity did not occur: Safety/medical concerns (pt unable to participate in [facility] assessment this date 2/2 severe  nausea and vomiting and unable to transfer OOB at this time.)         Wheelchair 50 feet with 2 turns activity    Assist    Wheelchair 50 feet with 2 turns activity did not occur: Safety/medical concerns       Wheelchair 150 feet activity     Assist  Wheelchair 150 feet activity did not occur: Safety/medical concerns       Blood pressure (!) 129/56, pulse (!) 58, temperature 98.1 F (36.7 C), temperature source Axillary, resp. rate 16, height 5\' 7"  (1.702 m), weight 65.5 kg, SpO2 93 %.  Medical Problem List and Plan: 1.  Decreased functional mobility with falls loss of vision left peripheral field secondary to right PCA infarction in the setting of left vertebral artery occlusion along with severe left ICA stenosis with previously silent subacute left parietal infarct secondary to large vessel disease.             -patient may shower             -ELOS/Goals: 12-16 days/Min/Mod A      Continue CIR, team conf in am  2.  Antithrombotics: -DVT/anticoagulation: Lovenox             -antiplatelet therapy: Aspirin 81 mg daily and Plavix 25 mg daily x3 months then Plavix alone 3. Pain Management: Lyrica 25 mg daily, Hx post herpetic neuralgia, skin rxn to  lidocaine patch again will d/c, increase lyrica to 100 mg. Improved sleep last noc. Pain is currently well controlled- sleeping well    discussed calcific tendinopathy XR results with pt and daughte r- pt without movement related pain so xray finding are not cause of symptoms  11/13- no complaints of pain today- con't regimen 4. Mood: Provide emotional support, family supportive             -antipsychotic agents: N/A 5. Neuropsych: This patient is capable of making decisions on her own behalf. 6. Skin/Wound Care: Routine skin checks 7. Fluids/Electrolytes/Nutrition: intake poor to inconsistent d/t nausea  -encouraged her to push po as much as she can. 8.  Hypertension.  Coreg 12.5 mg twice daily.  Monitor with increased  mobility.    Vitals:   11/26/19 1956 11/27/19 0422  BP: (!) 123/54 (!) 129/56  Pulse: (!) 55 (!) 58  Resp: 14 16  Temp:  98.1 F (36.7 C)  SpO2: 96% 93%  BPs stabilizing  Increase amlodipine to 10mg  and decrease Coreg to daily.   11/12: BP 172/61: add lisinopril 2.5mg   11/15 adding hytrin for bladder will need to monitor for orthostasis  Looks normal on 11/16 9.  Diabetes mellitus peripheral neuropathy.  Hemoglobin A1c 10.1.  SSI.  Patient on Glucophage 500 mg twice daily prior to admission.  Resume as needed             CBG (last 3)  Recent Labs    11/26/19 1657 11/26/19 2117 11/27/19 0606  GLUCAP 123* 124* 171*  Cont metformin Fairly well controlled 11/16 10.  Hyperlipidemia.  Lipitor 12.  Tobacco abuse.  Counseling 13.  Vestibular sx: added scopolamine patch with some improvement. Less limited  -scheduled  zofran   -acclimation, OOB  -11/2 spoke to pt/daughter.discussed favorable prognosis for improvement of vestibular sx but that it may take several weeks 14.  Urinary retention- now min A , voiding trial , requires I/O cath , start Hytrin given sulfa allergy  15. Constipation  11/14- LBM 4 days ago when cleaned out- will try Sorbitol 15cc- per pt/family request- added senokot to bowel meds.      LOS: 18 days A FACE TO FACE EVALUATION WAS PERFORMED  Erick Colace 11/27/2019, 11:04 AM

## 2019-11-27 NOTE — Progress Notes (Signed)
Physical Therapy Session Note  Patient Details  Name: Chelsea [name] MRN: [id number] Date of Birth: 02-Mar-1945  Today's Date: 11/27/2019 PT Individual Time: 2836-6294 PT Individual Time Calculation (min): 70 min   Short Term Goals: Week 3:  PT Short Term Goal 1 (Week 3): pt will ambulate 133ft with min assist and LRAD PT Short Term Goal 2 (Week 3): pt will perform bed <> chair transfer with LRAD and CGA PT Short Term Goal 3 (Week 3): pt will perfrom sit<>stand with CGA  Skilled Therapeutic Interventions/Progress Updates: Pt presented at [facility] with dgt present agreeable to therapy with extensive encouragement. Pt noted to not have BM for several days and is extremely uncomfortable. Pt performed STS with RW and dgt performing CGA, pt then performed ambulatory transfer to w/c with dgt leading. Pt then propelled to day room with supervision and increased time due to intermittent shoulder pain and fatigue. Pt then performed ambulatory transfer to [employer]Step with CGA overall and pt participated in a total of 7 minutes with rest breaks after approx 2 min each and using BLE and RUE only due to pain in LUE. Upon completion pt performed stand pivot to w/c with pt indicating possible urgency for BM. Pt transported to bathroom and performed stand pivot to toilet with CGA and increased verbal cues for sequencing due to pt being more distracted. Pt sat for extended time at toilet however was unsuccessful. As pt was on toilet dgt expressed anxiety regarding care of pt as she will be the only caretaker. Explained importance for finding balance and necessity for her self care for physical and mental well being. PTA also expressed concerns to LSW regarding possible use of HHA for a few hours a week to allow respite for dgt. Pt then performed ambulatory transfer to bed with dgt assisting and PTA supervising. Due to pt's distractability pt required near [facility] for attention to RLE, safety with RW, and sequencing. Once at bed pt  returned to bed with minA from dgt for BLE management. Pt repositioned self to comfort and left with call bell within reach and nsg present.      Therapy Documentation Precautions:  Precautions Precautions: Fall Restrictions Weight Bearing Restrictions: No General:   Vital Signs: Therapy Vitals Temp: 97.9 F (36.6 C) Temp Source: Oral Pulse Rate: (!) 59 Resp: 19 BP: (!) 157/65 Patient Position (if appropriate): Lying Oxygen Therapy SpO2: 98 % O2 Device: Room Air   Therapy/Group: Individual Therapy  Elbert Spickler  Dreyson Mishkin, PTA  11/27/2019, 4:38 PM

## 2019-11-28 ENCOUNTER — Inpatient Hospital Stay (HOSPITAL_COMMUNITY): Payer: PPO | Admitting: Occupational Therapy

## 2019-11-28 ENCOUNTER — Inpatient Hospital Stay (HOSPITAL_COMMUNITY): Payer: PPO | Admitting: Physical Therapy

## 2019-11-28 LAB — GLUCOSE, CAPILLARY
Glucose-Capillary: 106 mg/dL — ABNORMAL HIGH (ref 70–99)
Glucose-Capillary: 130 mg/dL — ABNORMAL HIGH (ref 70–99)
Glucose-Capillary: 240 mg/dL — ABNORMAL HIGH (ref 70–99)
Glucose-Capillary: 99 mg/dL (ref 70–99)

## 2019-11-28 LAB — URINALYSIS, ROUTINE W REFLEX MICROSCOPIC
Bilirubin Urine: NEGATIVE
Glucose, UA: NEGATIVE mg/dL
Hgb urine dipstick: NEGATIVE
Ketones, ur: NEGATIVE mg/dL
Nitrite: NEGATIVE
Protein, ur: 30 mg/dL — AB
Specific Gravity, Urine: 1.009 (ref 1.005–1.030)
WBC, UA: >50 WBC/hpf — ABNORMAL HIGH (ref 0–5)
pH: 5 (ref 5.0–8.0)

## 2019-11-28 MED ORDER — DOCUSATE SODIUM 100 MG PO CAPS
200.0000 mg | ORAL_CAPSULE | Freq: Two times a day (BID) | ORAL | Status: DC
  Administered 2019-11-28 – 2019-11-29 (×3): 200 mg via ORAL
  Filled 2019-11-28 (×3): qty 2

## 2019-11-28 MED ORDER — SORBITOL 70 % SOLN
960.0000 mL | TOPICAL_OIL | Freq: Once | ORAL | Status: AC
  Administered 2019-11-28: 960 mL via RECTAL
  Filled 2019-11-28: qty 473

## 2019-11-28 MED ORDER — SENNA 8.6 MG PO TABS
2.0000 | ORAL_TABLET | Freq: Every day | ORAL | Status: DC
  Administered 2019-11-28 – 2019-11-29 (×2): 17.2 mg via ORAL
  Filled 2019-11-28 (×2): qty 2

## 2019-11-28 NOTE — Progress Notes (Signed)
Patient ID: Chelsea [name], female   DOB: 11-17-1945, 74 y.o.   MRN: [id number] Team Conference Report to Patient/Family  Team Conference discussion was reviewed with the patient and caregiver, including goals, any changes in plan of care and target discharge date.  Patient and caregiver express understanding and are in agreement.  The patient has a target discharge date of 12/03/19 ([facility] OR).  Andria Rhein 11/28/2019, 1:23 PM

## 2019-11-28 NOTE — Progress Notes (Signed)
Occupational Therapy Session Note  Patient Details  Name: Chelsea [name] MRN: [id number] Date of Birth: [birth year]  Today's Date: 11/28/2019 OT Individual Time: 7078-6754 OT Individual Time Calculation (min): 58 min   OT Individual Time: 1415-1500 OT Individual Time Calculation (min): 45 min    Short Term Goals: Week 3:  OT Short Term Goal 1 (Week 3): STG = LTG 2/2 ELOS  Skilled Therapeutic Interventions/Progress Updates:  Session 1: Patient met seated in recliner in agreement with OT treatment session. Mild chronic pain noted in L shoulder. Patient declined bathing/dressing in ADL bathroom with use of tub/bench 2/2 continued constipation. Patient/daughter report inability for BM in 7 days despite efforts. Daughter reports completing transfers with patient without event and confidence in patient's ability to complete tasks with minimal assistance. Pending surgery date scheduled for 11/22. Functional mobility with use of RW and assist from daughter to sink in prep for bathing tasks. Patient requiring more cueing for attention to RLE and proximity to RW this date. UB/LB bathing/dressing sitting/standing at sink level with increased independence noted this date. Patient able to bathe UB with set-up and LB with assist to maintain standing with cues for attention to RLE. Patient in agreement with brushing teeth standing at sink level with assist for balance and cues as noted above. Session concluded with patient lying supine in bed with call bell within reach, all needs met, and daughter present at bedside.   Session 2: Patient met seated in recliner in agreement with OT treatment session. Patient initially declining activity asking for 15 more minutes of rest prior to start of session. After 15 minutes, patient in agreement with leaving room for combined session with recreational therapy. Patient doffed bilateral socks and donned shoes with set-up assist. Functional mobility from room to high/low  table in dayroom with 1 seated rest break. Patient very motivated to walk full distance and was disappointed in herself for needing a rest break. Mosaic craft initiated with recreational therapist in which patient was able to glue shapes onto paper on easel demonstrating good bimanual coordination. Patient noting some difficulty with seeing aspects of mosaic but compensated well for visual acuity deficits. Total A for wc transport back to room for time management and energy conservation. Session concluded with patient lying supine in bed with call bell within reach, all needs met and daughter Lattie Haw present at bedside.   Therapy Documentation Precautions:  Precautions Precautions: Fall Restrictions Weight Bearing Restrictions: No General: General OT Amount of Missed Time: 15 Minutes  Therapy/Group: Individual Therapy  Freddi Schrager R Howerton-Davis 11/28/2019, 7:29 AM

## 2019-11-28 NOTE — Progress Notes (Signed)
Patient ID: Chelsea [name], female   DOB: [birth year], 74 y.o.   MRN: [id number]  SW in process of getting patient set up with Home Health services for: PT, OT [facility] follow up.  Patient has been declined by: [facility] (due to staffing), [facility] (due to service area) and [facility] (due to staffing- instructed to follow up Monday due to family requested company).  Sw will follow up and continue to attempt.  [place], [place] [phone]

## 2019-11-28 NOTE — Patient Care Conference (Signed)
Inpatient RehabilitationTeam Conference and Plan of Care Update Date: 11/28/2019   Time: 10:17 AM    Patient Name: Chelsea [name]      Medical Record Number: [id number]  Date of Birth: 01-Mar-1945 Sex: Female         Room/Bed: 4W12C/4W12C-01 Payor Info: Payor: Arna [employer]AGE / Plan: [employer] PPO / Product Type: *No Product type* /    Admit Date/Time:  11/09/2019  7:29 PM  Primary Diagnosis:  <principal problem not specified>  Hospital Problems: Active Problems:   Acute ischemic right posterior cerebral artery (PCA) stroke (HCC)   Pain   Stroke [facility])    Expected Discharge Date: Expected Discharge Date: 12/03/19 (CEA OR)  Team Members Present: Physician leading conference: Dr. Claudette Laws Care Coodinator Present: Chana Bode, RN, BSN, CRRN;Christina Vita Barley, BSW Nurse Present: Willey Blade, RN PT Present: Grier Rocher, PT OT Present: Lina Sayre, OT PPS Coordinator present : Edson Snowball, Park Breed, SLP     Current Status/Progress Goal Weekly Team Focus  Bowel/Bladder   Pt is continent of bowel and bladder. LBM-11/11 or 11/12 per pt and daughter. Given suppository and sorbitol by day shift on 11/16.  To remain continent of bowel/bladder.  q2 toileting or prn   Swallow/Nutrition/ Hydration             ADL's   UB bathing/dressing set-up, LB bathing/dressing Min A at most for balance in standing, Min A transfers.  Min to Mod A  Activity tolerance, sitting/standing balance, ADL transfers, family ed.   Mobility   CGA to minA bed mobility, CGA to minA STS, minA stand pivot, min to modA gait with RW  CGA transfers and bed mobility; min A gait; CGA WC mobility  R NMR, balance, endurance, family ed   Communication             Safety/Cognition/ Behavioral Observations            Pain   Pt c/o left shoulder pain and has prn tylenol if needed and uses K pad.  To bring pain level below 2/10.  Assess pain q shift or prn.   Skin    Skin is intact.  To prevent skin breakdown from occurring.  Assess skin q shift or prn     Discharge Planning:  Discharging home with daughter (24/7) 1 level home, 4 steps to enter   Team Discussion: CEA scheduled for Monday 12/03/19; anticipate discharge to acute for surgery and discharge home the day after surgery. Patient has made good progress despite self limiting behaviors and over reliance on daughter. Daughter is able to provide appropriate cues and encouraging patient to do activities herself. Able to stand at the sink and complete ADLs. Patient on target to meet rehab goals: yes, min - mod assist goals set Currently CGA - min assist for transfers as right knee hyper extends and requires rest breaks between activities.  *See Care Plan and progress notes for long and short-term goals.   Revisions to Treatment Plan:   Teaching Needs: Transfers, toileting, cues, medications, etc  Current Barriers to Discharge: Decreased caregiver support and Behavior  Possible Resolutions to Barriers: Family education with daughter     Medical Summary Current Status: will be scheduled for L CEA on 11/22, constipated no abd pain urine smells strong  Barriers to Discharge: Pending Surgery;Medical stability;Other (comments)  Barriers to Discharge Comments: constipation Possible Resolutions to Barriers/Weekly Focus: coordinating with vascular surgery in terms of upcoming surgery   Continued Need for Acute Rehabilitation  Level of Care: The patient requires daily medical management by a physician with specialized training in physical medicine and rehabilitation for the following reasons: Direction of a multidisciplinary physical rehabilitation program to maximize functional independence : Yes Medical management of patient stability for increased activity during participation in an intensive rehabilitation regime.: Yes Analysis of laboratory values and/or radiology reports with any subsequent need  for medication adjustment and/or medical intervention. : Yes   I attest that I was present, lead the team conference, and concur with the assessment and plan of the team.   Chana Bode B 11/28/2019, 2:49 PM

## 2019-11-28 NOTE — Progress Notes (Addendum)
[facility] PHYSICAL MEDICINE & REHABILITATION PROGRESS NOTE   Subjective/Complaints:  Moderate results with SMOG enema UA + leuk, >50WBC, neg nitrate, discussed pt's drug allergies   ROS:   Pt denies SOB, abd pain, CP, N/V/C/D, and vision changes  Objective:   No results found. No results for input(s): WBC, HGB, HCT, PLT in the last 72 hours. No results for input(s): NA, K, CL, CO2, GLUCOSE, BUN, CREATININE, CALCIUM in the last 72 hours.  Intake/Output Summary (Last 24 hours) at 11/28/2019 0914 Last data filed at 11/28/2019 0814 Gross per 24 hour  Intake 820 ml  Output 0 ml  Net 820 ml   Physical Exam: Vital Signs Blood pressure (!) 130/58, pulse (!) 56, temperature 98.9 F (37.2 C), resp. rate 15, height 5\' 7"  (1.702 m), weight 65.5 kg, SpO2 98 %.   General: No acute distress Mood and affect are appropriate Heart: Regular rate and rhythm no rubs murmurs or extra sounds Lungs: Clear to auscultation, breathing unlabored, no rales or wheezes Abdomen: Positive bowel sounds, soft nontender to palpation, nondistended Extremities: No clubbing, cyanosis, or edema Skin: No evidence of breakdown, no evidence of rash  Neuro: eyes opened. Follows commands. Initiates conversation and demonstrates fair insight and awareness. Poor visual acuity/depth perception when eyes are open. 4/5 RUE, LUE, LLE, RLE  Senses LT in all 4's.  Musculoskeletal:mild shoulder pain mainly at night    Assessment/Plan: 1. Functional deficits secondary to right PCA infarct which require 3+ hours per day of interdisciplinary therapy in a comprehensive inpatient rehab setting.  Physiatrist is providing close team supervision and 24 hour management of active medical problems listed below.  Physiatrist and rehab team continue to assess barriers to discharge/monitor patient progress toward functional and medical goals  Care Tool:  Bathing    Body parts bathed by patient: Right arm, Left arm, Chest,  Abdomen, Front perineal area, Right upper leg, Left upper leg, Face, Buttocks, Right lower leg, Left lower leg   Body parts bathed by helper: Buttocks Body parts n/a: Right lower leg, Left lower leg   Bathing assist Assist Level: Minimal Assistance - Patient > 75%     Upper Body Dressing/Undressing Upper body dressing   What is the patient wearing?: Pull over shirt    Upper body assist Assist Level: Set up assist    Lower Body Dressing/Undressing Lower body dressing      What is the patient wearing?: Incontinence brief     Lower body assist Assist for lower body dressing: Minimal Assistance - Patient > 75%     Toileting Toileting    Toileting assist Assist for toileting: Minimal Assistance - Patient > 75%     Transfers Chair/bed transfer  Transfers assist  Chair/bed transfer activity did not occur: Safety/medical concerns  Chair/bed transfer assist level: Minimal Assistance - Patient > 75%     Locomotion Ambulation   Ambulation assist   Ambulation activity did not occur: Safety/medical concerns  Assist level: Minimal Assistance - Patient > 75% Assistive device: Walker-rolling Max distance: 54ft   Walk 10 feet activity   Assist  Walk 10 feet activity did not occur: Safety/medical concerns  Assist level: Minimal Assistance - Patient > 75% Assistive device: Walker-rolling   Walk 50 feet activity   Assist Walk 50 feet with 2 turns activity did not occur: Safety/medical concerns  Assist level: Minimal Assistance - Patient > 75% Assistive device: Walker-rolling    Walk 150 feet activity   Assist Walk 150 feet activity did not  occur: Safety/medical concerns         Walk 10 feet on uneven surface  activity   Assist Walk 10 feet on uneven surfaces activity did not occur: Safety/medical concerns         Wheelchair     Assist Will patient use wheelchair at discharge?: Yes Type of Wheelchair: Manual Wheelchair activity did not occur:  Safety/medical concerns (pt unable to participate in [facility] assessment this date 2/2 severe nausea and vomiting and unable to transfer OOB at this time.)         Wheelchair 50 feet with 2 turns activity    Assist    Wheelchair 50 feet with 2 turns activity did not occur: Safety/medical concerns       Wheelchair 150 feet activity     Assist  Wheelchair 150 feet activity did not occur: Safety/medical concerns       Blood pressure (!) 130/58, pulse (!) 56, temperature 98.9 F (37.2 C), resp. rate 15, height 5\' 7"  (1.702 m), weight 65.5 kg, SpO2 98 %.  Medical Problem List and Plan: 1.  Decreased functional mobility with falls loss of vision left peripheral field secondary to right PCA infarction in the setting of left vertebral artery occlusion along with severe left ICA stenosis with previously silent subacute left parietal infarct secondary to large vessel disease.             -patient may shower             -ELOS/Goals: d/c to [facility], 4E on 11/22 /Min/Mod A      Continue CIR, PT, OT 2.  Antithrombotics: -DVT/anticoagulation: Lovenox             -antiplatelet therapy: Aspirin 81 mg daily and Plavix 25 mg daily x3 months then Plavix alone 3. Pain Management: Lyrica 25 mg daily, Hx post herpetic neuralgia, skin rxn to  lidocaine patch again will d/c, increase lyrica to 100 mg. Improved sleep last noc. Pain is currently well controlled- sleeping well    discussed calcific tendinopathy XR results with pt and daughte r- pt without movement related pain so xray finding are not cause of symptoms  11/13- no complaints of pain today- con't regimen 4. Mood: Provide emotional support, family supportive             -antipsychotic agents: N/A 5. Neuropsych: This patient is capable of making decisions on her own behalf. 6. Skin/Wound Care: Routine skin checks 7. Fluids/Electrolytes/Nutrition: intake poor to inconsistent d/t nausea  -encouraged her to push po as much as she can. 8.   Hypertension.  Coreg 12.5 mg twice daily.  Monitor with increased mobility.    Vitals:   11/27/19 1940 11/28/19 0525  BP: (!) 144/56 (!) 130/58  Pulse: (!) 57 (!) 56  Resp: 14 15  Temp: 98.4 F (36.9 C) 98.9 F (37.2 C)  SpO2: 97% 98%  BPs improved acceptable range 9.  Diabetes mellitus peripheral neuropathy.  Hemoglobin A1c 10.1.  SSI.  Patient on Glucophage 500 mg twice daily prior to admission.  Resume as needed             CBG (last 3)  Recent Labs    11/27/19 1642 11/27/19 2110 11/28/19 0606  GLUCAP 119* 124* 106*  Cont metformin  well controlled 11/18 10.  Hyperlipidemia.  Lipitor 12.  Tobacco abuse.  Counseling 13.  Vestibular sx: added scopolamine patch with some improvement. Less limited  -scheduled zofran   -acclimation, OOB  -11/2 spoke to pt/daughter.discussed  favorable prognosis for improvement of vestibular sx but that it may take several weeks 14.  Urinary tract infx- will start empiric macrobid given PCN and Sulfa allergy , await cx 15. Constipation  Increased colace, SMOG enema today if no BM     LOS: 19 days A FACE TO FACE EVALUATION WAS PERFORMED  Erick Colace 11/28/2019, 9:14 AM

## 2019-11-28 NOTE — Discharge Summary (Signed)
Occupational Therapy Discharge Summary  Patient Details  Name: Chelsea [name] MRN: [id number] Date of Birth: 01-28-1945  Today's Date: 12/02/2019 OT Individual Time: 1015-1100 OT Individual Time Calculation (min): 45 min   Pt greeted semi-reclined in bed with daughter present. Pt's daughter reports functional decline with onset of R LE weakness for the past few days. OT educated on donning TED hose using friction reducing bag which daughter demonstrated understanding and appreciative of learning new technique. Pt completed bed mobility with increased time and min A. Abdominal binder placed at EOB for BP management. Pt then completed sit<>stand at EOB with min A. Pt ambulated 5 feet w/ RW to [facility] with min A, and 1 posterior LOB reqiuring mod A to correct. Pt needed OT assist to manage clothing 2/2 fearful to remove unilateral UE from the RW. Pt voided bladder,  Then was able to complete her own peri-care. Sit<>stand from [facility] with min A and RW. Min A to ambulate back to bed with lateral swaying stating she felt weak. Pt stood again at EOB after rest break and took side steps along EOB with min A, but difficulty weight shifting onto R LE for side step. Pt returned to bed and was left semi-reclined with daughter present and needs met.    Patient has met 14 of 14 long term goals due to improved activity tolerance, improved balance, postural control, ability to compensate for deficits, functional use of  RIGHT upper and RIGHT lower extremity, improved attention, improved awareness and improved coordination. Patient to discharge at [facility] Assist level requiring supervision A at best and Min A at most with BADLs and BADL transfers. Patient to d/c home with daughter who was present throughout patient LOS. Family education completed with family able to assist patient with BADLs/BADL transfers as needed.     Reasons goals not met: N/A  Recommendation:  Patient will benefit from ongoing skilled OT services  in home health setting to continue to advance functional skills in the area of BADL, iADL and Reduce care partner burden.  Equipment: BSC, TTB  Reasons for discharge: treatment goals met and discharge from hospital  Patient/family agrees with progress made and goals achieved: Yes  OT Discharge Precautions/Restrictions  Precautions Precautions: Fall Restrictions Weight Bearing Restrictions: No Pain  denies pain ADL ADL Eating: Supervision/safety Grooming: Supervision/safety Where Assessed-Grooming: Bed level Upper Body Bathing: Minimal assistance Where Assessed-Upper Body Bathing: Bed level Lower Body Bathing: Minimal assistance Where Assessed-Lower Body Bathing: Bed level Upper Body Dressing: Minimal assistance Where Assessed-Upper Body Dressing: Bed level Lower Body Dressing: Moderate assistance Where Assessed-Lower Body Dressing: Bed level Toileting: Minimal assistance, Moderate assistance Toilet Transfer: Minimal assistance Tub/Shower Transfer: Minimal assistance Perception  Perception: Impaired Inattention/Neglect: Does not attend to left visual field;Does not attend to right side of body Praxis Praxis: Impaired Praxis Impairment Details: Perseveration;Motor planning Cognition Overall Cognitive Status: History of cognitive impairments - at baseline Arousal/Alertness: Awake/alert Orientation Level: Oriented X4 Sustained Attention: Impaired Memory: Impaired Problem Solving: Impaired Safety/Judgment: Appears intact Motor  Motor Motor: Hemiplegia;Ataxia;Abnormal postural alignment and control Motor - Discharge Observations: Greatly improved since eval Mobility  Transfers Sit to Stand: Minimal Assistance - Patient > 75%  Balance Static Sitting Balance Static Sitting - Level of Assistance: 5: Stand by assistance Dynamic Sitting Balance Dynamic Sitting - Balance Support: No upper extremity supported Dynamic Sitting - Level of Assistance: 5: Stand by  assistance Static Standing Balance Static Standing - Level of Assistance: 4: Min assist Dynamic Standing Balance Dynamic Standing -  Level of Assistance: 4: Min assist;3: Mod assist Extremity/Trunk Assessment RUE Assessment RUE Assessment: Exceptions to [facility] General Strength Comments: generalized weakness, improved since eval LUE Assessment LUE Assessment: Exceptions to [facility] General Strength Comments: generalized weakness/discoordinatio-greatly improved since eval, but still remains Brunstrum levels for arm and hand: Arm;Hand Brunstrum level for arm: Stage V Relative Independence from [employer] Brunstrum level for hand: Stage VI Isolated joint movements   Daneen Schick Vennie Waymire 12/02/2019, 12:59 PM

## 2019-11-28 NOTE — Progress Notes (Signed)
Recreational Therapy Session Note  Patient Details  Name: Chelsea [name] MRN: [id number] Date of Birth: 12-Sep-1945 Today's Date: 11/28/2019  Pain: c/o of bowel pain, RN/MD aware & treating Skilled Therapeutic Interventions/Progress Updates: Session focused on selective attention, visual scanning and BUE coordination/fine motor use during co-treat with OT.  Pt seated w/c level and participated in mosaic craft placed on a easel tabletop level.  Pt required set up assist and verbal cues.  Pt stated difficulty with vision, but was able to complete the task with minimal cues.  Therapy/Group: Co-Treatment   Dorella Laster 11/28/2019, 3:22 PM

## 2019-11-28 NOTE — Progress Notes (Signed)
Physical Therapy Session Note  Patient Details  Name: Chelsea [name] MRN: [id number] Date of Birth: 10-03-1945  Today's Date: 11/28/2019 PT Individual Time: 9450-3888 and 1701-1726 PT Individual Time Calculation (min): 45 min and 3mn  Short Term Goals: Week 3:  PT Short Term Goal 1 (Week 3): pt will ambulate 1067fwith min assist and LRAD PT Short Term Goal 2 (Week 3): pt will perform bed <> chair transfer with LRAD and CGA PT Short Term Goal 3 (Week 3): pt will perfrom sit<>stand with CGA Skilled Therapeutic Interventions/Progress Updates:    Session 1:  Pt received supine in bed, sleeping. Pt easily aroused and responsive to light touch and agreeable to PT. Pt does not report pain at this time. Per physician request and patient request to discontinue abdominal binder, PT performed orthostatic testing. Results as follows:  Supine: BP 157/61; HR 60 Sitting EOB: BP 156/62; HR 64  Standing: BP 121/77; HR 71 Standing 2 min: BP 123/79; HR 63  No noted orthostatic symptoms reported with change in positions.  Pt able to come to sit EOB with supervision with use of bed features. Pt able to stand from lowest bed height with min assist for anterior weight shift and achievement of full hip extension. In standing, pt with increased R lateral lean and narrow BOS, however responds well to PT on L side and cues to lean L/widen feet to correct abnormal posture. Pt use of RW for stability.  Pt ambulated ~8022fith RW and min assist for weight shift to L in order to assist with R foot clearance.   Pt participated in dynamic reaching task in standing on BITS (Visual Scanning) with sequencing of uppercase alphabet. Pt with noted difficulty in Quadrants 2/3 demonstrating L visual field deficits and difficulty scanning to the L. Pt instructed to use R hand to reach across body in order to facilitate increased L lateral weight shift. Pt able to remain standing for ~5 min to complete task, then returned to  [facility] ~[id number] to room same fashion as above. Pt able to turn and sit near HOB [facility] initiation of appropriate positioning in bed. Pt returned to supine with min assist for LE management.   Pt performed ~5 sit<>stands throughout session with min assist for stability and balance in order to perform tasks.   No noted orthostatic symptoms reported throughout session despite abdominal binder not in use.  Session 2: Pt received supine in bed and agreeable to PT in room. Pt does not complain of pain at this time. Pt comes to sit on L EOB with CGA using bed features. Pt requesting to void bladder and collect sample in bedside commode. Pt required max assist for clothing management and doffing brief. Pt able to void bladder. Donned clean brief in standing with min assist and use of RW for standing balance and max assist for clothing management. Pt performed stand pivot transfer bed <> bedside commode.   Pt performed sit<>stand 5x with CGA and RW in front for stability. Pt able to perform from near lowest height of bed.   Pt ambulated forward/backward ~12ft56fh RW. Pt required CGA to ambulate forward, but mod assist backward to prevent fall due to ~4 LOB to the R and narrow BOS secondary to internal and external distractions. Pt side stepping to L x 5 times in order to sit EOB. Pt requires min assist for side stepping in order to manage AD and properly weight shift.   Pt scooted  to her L ~10times in order to achieve proper positioning in bed. Pt demonstrates poor motor planning for task and needs to be corrected on how to unweight her bottom and lean forward in order to achieve appropriate movement.    Pt returned supine in bed with min assist for LE management and left with bed alarm on, call bell in reach, and needs met.   Therapy Documentation Precautions:  Precautions Precautions: Fall Restrictions Weight Bearing Restrictions: No   Therapy/Group: Individual Therapy  Gaylord Shih 11/28/2019, 12:12 PM

## 2019-11-28 NOTE — Progress Notes (Signed)
SMOG administered with medium results.

## 2019-11-29 ENCOUNTER — Inpatient Hospital Stay (HOSPITAL_COMMUNITY): Payer: PPO | Admitting: Physical Therapy

## 2019-11-29 ENCOUNTER — Inpatient Hospital Stay (HOSPITAL_COMMUNITY): Payer: PPO | Admitting: *Deleted

## 2019-11-29 ENCOUNTER — Inpatient Hospital Stay (HOSPITAL_COMMUNITY): Payer: PPO | Admitting: Occupational Therapy

## 2019-11-29 LAB — GLUCOSE, CAPILLARY
Glucose-Capillary: 121 mg/dL — ABNORMAL HIGH (ref 70–99)
Glucose-Capillary: 183 mg/dL — ABNORMAL HIGH (ref 70–99)
Glucose-Capillary: 135 mg/dL — ABNORMAL HIGH (ref 70–99)
Glucose-Capillary: 184 mg/dL — ABNORMAL HIGH (ref 70–99)

## 2019-11-29 MED ORDER — SENNOSIDES-DOCUSATE SODIUM 8.6-50 MG PO TABS
2.0000 | ORAL_TABLET | Freq: Two times a day (BID) | ORAL | Status: DC
  Administered 2019-11-29 – 2019-12-02 (×8): 2 via ORAL
  Filled 2019-11-29 (×9): qty 2

## 2019-11-29 MED ORDER — NITROFURANTOIN MONOHYD MACRO 100 MG PO CAPS
100.0000 mg | ORAL_CAPSULE | Freq: Two times a day (BID) | ORAL | Status: DC
  Administered 2019-11-29 – 2019-12-02 (×8): 100 mg via ORAL
  Filled 2019-11-29 (×8): qty 1

## 2019-11-29 NOTE — Progress Notes (Signed)
Occupational Therapy Session Note  Patient Details  Name: Chelsea [name] MRN: [id number] Date of Birth: 06-29-1945  Today's Date: 11/29/2019 OT Individual Time: 1001-1100 OT Individual Time Calculation (min): 59 min   OT Individual Time: 1345-1430 OT Individual Time Calculation (min): 45 min   Short Term Goals: Week 3:  OT Short Term Goal 1 (Week 3): STG = LTG 2/2 ELOS  Skilled Therapeutic Interventions/Progress Updates:  Session 1: Patient met lying supine in bed. Chronic L shoulder pain reported at rest. K pad in place and patient premedicated. Patient feeling disappointed after finding out that she has a bladder infection. Emotional support provided throughout treatment session. Patient reporting fatigue but in agreement with grooming seated at sink level. 3/3 grooming tasks performed with set-up assist. Total A for wc transport from room <> hospital atrium for change of scenery and to boost patients spirits. In sitting, patient completed 28 piece puzzle with minimal assistance and question cues. Patient in agreement with remaining out of bed at conclusion of session. Session concluded with patient seated in wc with call bell within reach and all needs met. Daughter Lattie Haw present at bedside.     Session 2: Patient met lying supine in bed. Increased encouragement for participation with therapy 2/2 fatigue. Patient with increased confusion and decreased short-term memory likely 2/2 UTI. Patient/daughter note that patient has not been to the bathroom to empty bladder this date. Supine to EOB with Min guard, sit to stand with Min guard and functional mobility to commode in bathroom with Min A and cues for attention and walker management. Patient completed toileting/hygiene/clothing management with Min A. Functional mobility to sink level with Min guard and periods of Min A with increased external/internal distraction. Hand hygiene at sink level with Min guard to Min A to maintain standing balance  with and without unilateral UE support on RW. Return to supine in same manner as above. Session concluded with patient lying supine in bed with call bell within reach, bed alarm activated, and all needs met.   Therapy Documentation Precautions:  Precautions Precautions: Fall Restrictions Weight Bearing Restrictions: No General:    Therapy/Group: Individual Therapy  Zyanna Leisinger R Howerton-Davis 11/29/2019, 7:17 AM

## 2019-11-29 NOTE — Progress Notes (Signed)
Physical Therapy Session Note  Patient Details  Name: Chelsea [name] MRN: [id number] Date of Birth: March 20, 1945  Today's Date: 11/29/2019 PT Individual Time:0800 -0845 and 1705-1735  45 min and 30 min   Short Term Goals: Week 3:  PT Short Term Goal 1 (Week 3): pt will ambulate 134f with min assist and LRAD PT Short Term Goal 2 (Week 3): pt will perform bed <> chair transfer with LRAD and CGA PT Short Term Goal 3 (Week 3): pt will perfrom sit<>stand with CGA  Skilled Therapeutic Interventions/Progress Updates:   Session 1:  Pt received supine in bed and agreeable to PT. Pt reports mild pain in her L shoulder, but does report a rating. However, reported pain does not limit success of PT session. Pt able to come to L EOB with feet flat on floor with supervision while using bed features to assist her. Pt able to hold seated balance while PT donned shoes with max assist for time management. [facility] for AD management and verbal cueing for foot placement with sit>stand from near lowest bed height requiring min assist from PT to reach full trunk extension. [facility] total assist for time management and energy conservation.   Pt participated in card matching while standing in walker. Pt required to reach across her body with her L hand to obtain a card from her daughter on the R, then match it to the corresponding card. Wall of cards positioned from midline to L in order to encourage L visual scanning. Pt able to remain standing for ~670m and ~69m35mwith mod assist from PT to prevent falls in the R posterior direction. Pt guarding on L in order to encourage L weight shift with external target. Pt requires greater assistance with cards placed in the L lower quadrant and has difficulty returning to full standing position following placement of card in this area.   [facility]. While resting PT asked patient  to recall navigation through unit back to her room prior to performing it with ambulation. Pt able to recall directions back to room, then ambulated with RW and min assist using multiple verbal cues for postural alignment, foot advancement and trunk extension. Pt able to ambulate ~25f39fior to breakdown of mechanics and the need to sit down. Pt [facility] to [facility].   Pt performed stand pivot transfer with use of bed rail for stability and min assist using verbal cues for foot placement and postural correction.  Pt sit>supine with min assist for LE management only. Pt left supine in bed with needs met, bed alarm on and call bell in reach.   Throughout session, pt performed ~5sit<>stands ranging from CGA to min assist depending on height of surface.   Session 2: Pt received semi reclined in bed and agreeable to PT. Pt is currently on timed toileting every 2 hours due to presence of UTI, therefore PT assisted pt to toilet via CGA and use of RW. Pt able to void bowel and bladder this session. Pt attempted to do personal hygiene in sitting, but was unsuccessful in completion, therefore PT assisted pt to standing with min assist and performed hygiene and clothing management with mod assist. Pt instructed to lean forward while grasping rolling walker and able to maintain balance while hygiene performed.   PT assisted pt to sink for hand hygiene where she needed verbal  cueing for AD management and min assist for balance. Pt returned to [facility] placed behind her at sink, then rolled to bedside with total assist.   PT assisted with dinner set up and educated pt about importance of sitting up in chair for meals. Pt agreeable to sit during dinner. Pt left in chair with chair alarm on, needs met, and call bell in reach. Daughter in room upon departure to assist with meal.     Therapy Documentation Precautions:  Precautions Precautions: Fall Restrictions Weight Bearing Restrictions:  No Pain: Pain Assessment Pain Scale: 0-10 (pre medicated prior to therapy) Pain Score: 1   Therapy/Group: Individual Therapy  Gaylord Shih 11/29/2019, 9:32 AM

## 2019-11-30 ENCOUNTER — Inpatient Hospital Stay (HOSPITAL_COMMUNITY): Payer: PPO | Admitting: Physical Therapy

## 2019-11-30 ENCOUNTER — Inpatient Hospital Stay (HOSPITAL_COMMUNITY): Payer: PPO | Admitting: Occupational Therapy

## 2019-11-30 LAB — URINE CULTURE: Culture: >=100000 — AB

## 2019-11-30 LAB — GLUCOSE, CAPILLARY
Glucose-Capillary: 120 mg/dL — ABNORMAL HIGH (ref 70–99)
Glucose-Capillary: 145 mg/dL — ABNORMAL HIGH (ref 70–99)
Glucose-Capillary: 117 mg/dL — ABNORMAL HIGH (ref 70–99)
Glucose-Capillary: 110 mg/dL — ABNORMAL HIGH (ref 70–99)

## 2019-11-30 LAB — CREATININE, SERUM
Creatinine, Ser: 1.26 mg/dL — ABNORMAL HIGH (ref 0.44–1.00)
GFR, Estimated: 45 mL/min — ABNORMAL LOW (ref >60)

## 2019-11-30 MED ORDER — TERAZOSIN HCL 1 MG PO CAPS
2.0000 mg | ORAL_CAPSULE | Freq: Every day | ORAL | Status: DC
  Administered 2019-11-30 – 2019-12-02 (×3): 2 mg via ORAL
  Filled 2019-11-30 (×3): qty 2
  Filled 2019-11-30: qty 1

## 2019-11-30 NOTE — Progress Notes (Signed)
Physical Therapy Session Note  Patient Details  Name: Chelsea [name] MRN: [id number] Date of Birth: Jul 24, 1945  Today's Date: 11/30/2019 PT Individual Time: 0900-1000 PT Individual Time Calculation (min): 60 min   Short Term Goals: Week 1:  PT Short Term Goal 1 (Week 1): pt to tolerate OOB upright position for 2 hours PT Short Term Goal 1 - Progress (Week 1): Met PT Short Term Goal 2 (Week 1): pt to demonstrate supine<>sit mod A PT Short Term Goal 2 - Progress (Week 1): Met PT Short Term Goal 3 (Week 1): pt to demonstrate sitting balance CGA PT Short Term Goal 3 - Progress (Week 1): Met PT Short Term Goal 4 (Week 1): pt to demonstrate sit<>stand transfers with LRAD at min A PT Short Term Goal 4 - Progress (Week 1): Not met PT Short Term Goal 5 (Week 1): initiate gait training PT Short Term Goal 5 - Progress (Week 1): Met Week 2:  PT Short Term Goal 1 (Week 2): Pt will perform bed mobility with min assist consistently PT Short Term Goal 1 - Progress (Week 2): Met PT Short Term Goal 2 (Week 2): Pt will perform bed<>WC trasnfer with min assist and LRAD PT Short Term Goal 2 - Progress (Week 2): Met PT Short Term Goal 3 (Week 2): Pt will Ambulate 19f with mod assist and LRAD PT Short Term Goal 3 - Progress (Week 2): Met Week 3:  PT Short Term Goal 1 (Week 3): pt will ambulate 1085fwith min assist and LRAD PT Short Term Goal 2 (Week 3): pt will perform bed <> chair transfer with LRAD and CGA PT Short Term Goal 3 (Week 3): pt will perfrom sit<>stand with CGA     Skilled Therapeutic Interventions/Progress Updates:    pt received sitting edge of bed and agreeable to therapy. Daughter present. Pt directed in Sit to stand and step [facility] min A with extra time and single step cues. Pt denied pain.pt directed in WC mobility 100' min A for turn out of room and VC for attention to Lt side. Pt required rest break at this time, total A to rest of distance to elevators,  nursing aware and agreeable pt leave unit for PT. Pt with x2 daughters taken off unit to large, open distractible environment for PT session to improve pt's mood out of room, and improve pt ability to follow commands and complete tasks with distractions. Pt directed in gait training with pt received in bed and agreeable to therapy. For 35' +20' +20' min A for stability with VC for increased step height on Left lower extremity and trunk extension; during gait pt directed in object recall and identification grossly on Lt side with pt able to complete 4/5 tasks, 4/5, and 3/5 with gait reps, VC to attend and look fully to Lt side to find. Pt reported fatigue at this time and requested to complete some tasks in sitting, seated rest break taken, pt [facility] level surface around x3 tables/chairs for improved WC mobility, min A for completing turns but able attempting fully throughout and able to complete task with cues and min A; CGA and decreased velocity on straight paths. Pt reported feeling nauseous with the smell of food and requested to return to room, total A rest of distance to room for energy and time. Once returning to room, pt agreeable to complete session in room. Pt directed in categorical item retrieval with two step commands  to identify and retrieve items crossing midline, x4 completed CGA with extra time when began to vomit onto floor from standing for several minutes unable to stop long enough to sit until time passed. PT assisting pt in standing balance, pt's daughter assisting in holding emesis bag. Pt able to return to sitting in [facility] after 2-3 mins of this then continued to vomit. PT assisted with pt unable stopping, cold cloths given for comfort and cleaning. Pt reported she did not feel any better and needed to rest.PT gave total A for new socks, and cleaning pt's hands then left pt sitting in WC, alarm belt set, daughter present. All needs in reach and denied further  needs. Call  light in hand.    Therapy Documentation Precautions:  Precautions Precautions: Fall Restrictions Weight Bearing Restrictions: No General:   Vital Signs:  Pain:   Mobility:   Locomotion :    Trunk/Postural Assessment :    Balance:   Exercises:   Other Treatments:      Therapy/Group: Individual Therapy  Junie Panning 11/30/2019, 1:41 PM

## 2019-11-30 NOTE — Progress Notes (Signed)
Occupational Therapy Session Note  Patient Details  Name: Chelsea [name] MRN: [id number] Date of Birth: 1945-07-27  Today's Date: 11/30/2019 OT Individual Time: 1300-1310 OT Individual Time Calculation (min): 10 min    Short Term Goals: Week 3:  OT Short Term Goal 1 (Week 3): STG = LTG 2/2 ELOS  Skilled Therapeutic Interventions/Progress Updates:  Patient met asleep in bed. Daughter present at bedside. Daughter reports patient with increased discomfort and fatigue from UTI. Toileting completed every 2 hours with most recent an hour before OT arrival. Patient declined ADLs, bed level, and OOB activity this date missing 50 min of skilled OT treatment session. Patient remained in same position as found in.   Therapy Documentation Precautions:  Precautions Precautions: Fall Restrictions Weight Bearing Restrictions: No General: General OT Amount of Missed Time: 50 Minutes  Therapy/Group: Individual Therapy  Zyair Russi R Howerton-Davis 11/30/2019, 7:32 AM

## 2019-11-30 NOTE — Progress Notes (Addendum)
Physical Therapy Session Note  Patient Details  Name: Chelsea [name] MRN: [id number] Date of Birth: 03/22/1945  Today's Date: 11/30/2019 PT Individual Time: 1100-1145 PT Individual Time Calculation (min): 45 min   Short Term Goals: Week 3:  PT Short Term Goal 1 (Week 3): pt will ambulate 13ft with min assist and LRAD PT Short Term Goal 2 (Week 3): pt will perform bed <> chair transfer with LRAD and CGA PT Short Term Goal 3 (Week 3): pt will perfrom sit<>stand with CGA  Skilled Therapeutic Interventions/Progress Updates:    Pt received semi reclined in bed with daughter at bedside. Daughter reports that pt "has thrown up several times this morning". Pt agreeable to attempt voiding bladder. Pt performed supine>sidelying>sit EOB with CGA and verbal cueing/HOH assistance for appropriate sequencing for roll into sidelying. Pt able to perform sit>stand from bed to RW with minimal cueing for hand placement and min assist to achieve fully erect posture. Pt assisted to toilet using RW and min assist for ~49ft to achieve weight shift onto LLE for proper clearance of R foot in swing. Pt with 1 LOB to R when attempting to ambulate up small ramp into bathroom with RLE buckle, but able to achieve upright posture with PT providing min assist. Pt with noted difficulty motor planning footwork to turn around to sit on toilet and needs multiple verbal cues and external targets to widen feet in order to create stable BOS. Pt able to void bladder and bowels. While on toilet, pt with significant shiver and reports that she is "very cold". Pt attempted to perform pericare, but was unsuccessful, so PT performed pericare and brief donning with pt standing in walker with total assist for completion. Pt able to maintain balance in RW with BUE support throughout. Pt ambulated with RW and min assist in similar fashion as above to sink to perform hand hygiene. WC brought behind pt at sink and transported back to bedside with  total assist. Pt performed stand pivot transfer with use of bed rail and CGA with verbal cues and external targets to turn and sit down. Pt scoots back in bed and is able to perform sit>supine with CGA. Pt assisted with positioning due to noted fatigue. Prior to leaving room, pt began dry heaving and is able to use emesis bag to assist herself. PT assisted daughter and provided a comfortable setup for pt prior to leaving bedside. Pt left semi-reclined in bed with daughter at bedside and bed alarm on.   Therapy Documentation Precautions:  Precautions Precautions: Fall Restrictions Weight Bearing Restrictions: No General: PT Amount of Missed Time (min): 15 Minutes PT Missed Treatment Reason: Patient ill (Comment) (vomitting) Pain: denies    Therapy/Group: Individual Therapy  Karl Luke, SPT 11/30/2019, 11:50 AM

## 2019-11-30 NOTE — Progress Notes (Signed)
Recreational Therapy Discharge Summary Patient Details  Name: Chelsea [name] MRN: [id number] Date of Birth: Sep 14, 1945 Today's Date: 11/30/2019  Long term goals set: 1  Long term goals met: 1  Comments on progress toward goals: TR session focused on leisure education, activity analysis/ modifications with pt and daughters.  Pt enjoys cooking and crafting and family was modifying activities PTA for pt participation/enjoyment.  Pt met min assist level for simple TR tasks seated with verbal cues for encouragement, attention, and visual scanning. Pt is scheduled for discharge from [place] 11/22.  Reasons goals not met: n/a  Equipment acquired: n/a  Reasons for discharge: discharge from hospital Patient/family agrees with progress made and goals achieved: Yes  [name] 11/30/2019, 12:39 PM

## 2019-11-30 NOTE — Progress Notes (Signed)
[place] PHYSICAL MEDICINE & REHABILITATION PROGRESS NOTE   Subjective/Complaints:  Discussed UCx result with pt and daughter, as well as BP management UA + leuk, >50WBC, neg nitrate, >100K ecoli sens to nitro  ROS:   Pt denies SOB, abd pain, CP, N/V/C/D, and vision changes  Objective:   No results found. No results for input(s): WBC, HGB, HCT, PLT in the last 72 hours. Recent Labs    11/30/19 0437  CREATININE 1.26*    Intake/Output Summary (Last 24 hours) at 11/30/2019 0845 Last data filed at 11/29/2019 1815 Gross per 24 hour  Intake 220 ml  Output --  Net 220 ml   Physical Exam: Vital Signs Blood pressure (!) 164/62, pulse 63, temperature 98.5 F (36.9 C), resp. rate 17, height 5\' 7"  (1.702 m), weight 65.5 kg, SpO2 95 %.   General: No acute distress Mood and affect are appropriate Heart: Regular rate and rhythm no rubs murmurs or extra sounds Lungs: Clear to auscultation, breathing unlabored, no rales or wheezes Abdomen: Positive bowel sounds, soft nontender to palpation, nondistended Extremities: No clubbing, cyanosis, or edema Skin: No evidence of breakdown, no evidence of rash  Neuro: eyes opened. Follows commands. Initiates conversation and demonstrates fair insight and awareness. Poor visual acuity/depth perception when eyes are open. 4/5 RUE, LUE, LLE, RLE  Senses LT in all 4's.  Musculoskeletal:mild shoulder pain mainly at night    Assessment/Plan: 1. Functional deficits secondary to right PCA infarct which require 3+ hours per day of interdisciplinary therapy in a comprehensive inpatient rehab setting.  Physiatrist is providing close team supervision and 24 hour management of active medical problems listed below.  Physiatrist and rehab team continue to assess barriers to discharge/monitor patient progress toward functional and medical goals  Care Tool:  Bathing    Body parts bathed by patient: Right arm, Left arm, Chest, Abdomen, Front perineal  area, Right upper leg, Left upper leg, Face, Buttocks, Right lower leg, Left lower leg   Body parts bathed by helper: Buttocks Body parts n/a: Right lower leg, Left lower leg   Bathing assist Assist Level: Minimal Assistance - Patient > 75%     Upper Body Dressing/Undressing Upper body dressing   What is the patient wearing?: Pull over shirt    Upper body assist Assist Level: Set up assist    Lower Body Dressing/Undressing Lower body dressing      What is the patient wearing?: Incontinence brief     Lower body assist Assist for lower body dressing: Minimal Assistance - Patient > 75%     Toileting Toileting    Toileting assist Assist for toileting: Minimal Assistance - Patient > 75%     Transfers Chair/bed transfer  Transfers assist  Chair/bed transfer activity did not occur: Safety/medical concerns  Chair/bed transfer assist level: Minimal Assistance - Patient > 75%     Locomotion Ambulation   Ambulation assist   Ambulation activity did not occur: Safety/medical concerns  Assist level: Minimal Assistance - Patient > 75% Assistive device: Walker-rolling Max distance: 37ft   Walk 10 feet activity   Assist  Walk 10 feet activity did not occur: Safety/medical concerns  Assist level: Minimal Assistance - Patient > 75% Assistive device: Walker-rolling   Walk 50 feet activity   Assist Walk 50 feet with 2 turns activity did not occur: Safety/medical concerns  Assist level: Minimal Assistance - Patient > 75% Assistive device: Walker-rolling    Walk 150 feet activity   Assist Walk 150 feet activity did not  occur: Safety/medical concerns         Walk 10 feet on uneven surface  activity   Assist Walk 10 feet on uneven surfaces activity did not occur: Safety/medical concerns         Wheelchair     Assist Will patient use wheelchair at discharge?: Yes Type of Wheelchair: Manual Wheelchair activity did not occur: Safety/medical concerns  (pt unable to participate in [facility] assessment this date 2/2 severe nausea and vomiting and unable to transfer OOB at this time.)         Wheelchair 50 feet with 2 turns activity    Assist    Wheelchair 50 feet with 2 turns activity did not occur: Safety/medical concerns       Wheelchair 150 feet activity     Assist  Wheelchair 150 feet activity did not occur: Safety/medical concerns       Blood pressure (!) 164/62, pulse 63, temperature 98.5 F (36.9 C), resp. rate 17, height 5\' 7"  (1.702 m), weight 65.5 kg, SpO2 95 %.  Medical Problem List and Plan: 1.  Decreased functional mobility with falls loss of vision left peripheral field secondary to right PCA infarction in the setting of left vertebral artery occlusion along with severe left ICA stenosis with previously silent subacute left parietal infarct secondary to large vessel disease.             -patient may shower             -ELOS/Goals: d/c to [facility], [facility] on 11/22 /Min/Mod A      Continue CIR, PT, OT 2.  Antithrombotics: -DVT/anticoagulation: Lovenox             -antiplatelet therapy: Aspirin 81 mg daily and Plavix 25 mg daily x3 months then Plavix alone 3. Pain Management: Lyrica 25 mg daily, Hx post herpetic neuralgia, skin rxn to  lidocaine patch again will d/c, increase lyrica to 100 mg. Improved sleep last noc. Pain is currently well controlled- sleeping well    discussed calcific tendinopathy XR results with pt and daughte r- pt without movement related pain so xray finding are not cause of symptoms  11/13- no complaints of pain today- con't regimen 4. Mood: Provide emotional support, family supportive             -antipsychotic agents: N/A 5. Neuropsych: This patient is capable of making decisions on her own behalf. 6. Skin/Wound Care: Routine skin checks 7. Fluids/Electrolytes/Nutrition: intake poor to inconsistent d/t nausea  -encouraged her to push po as much as she can. 8.  Hypertension.  Coreg 12.5 mg twice  daily.  Monitor with increased mobility.    Vitals:   11/29/19 1947 11/30/19 0521  BP: (!) 153/56 (!) 164/62  Pulse: 64 63  Resp: 16 17  Temp: 98.1 F (36.7 C) 98.5 F (36.9 C)  SpO2: 95% 95%  BPs with elevated systolic this am , increase hytrin 9.  Diabetes mellitus peripheral neuropathy.  Hemoglobin A1c 10.1.  SSI.  Patient on Glucophage 500 mg twice daily prior to admission.  Resume as needed             CBG (last 3)  Recent Labs    11/29/19 1641 11/29/19 2116 11/30/19 0545  GLUCAP 135* 184* 120*  Cont metformin  well controlled 11/19 10.  Hyperlipidemia.  Lipitor 12.  Tobacco abuse.  Counseling 13.  Vestibular sx: added scopolamine patch with some improvement. Less limited  -scheduled zofran   -acclimation, OOB  -11/2 spoke  to pt/daughter.discussed favorable prognosis for improvement of vestibular sx but that it may take several weeks 14.  Urinary tract infx- e coli sens to nitrofurantoin  15. Constipation  Increased [name], small cont BM last noc      LOS: 21 days A FACE TO FACE EVALUATION WAS PERFORMED  Chelsea [name] 11/30/2019, 8:45 AM

## 2019-12-01 ENCOUNTER — Inpatient Hospital Stay (HOSPITAL_COMMUNITY): Payer: PPO | Admitting: Physical Therapy

## 2019-12-01 LAB — CBC WITH DIFFERENTIAL/PLATELET
Abs Immature Granulocytes: 0.06 10*3/uL (ref 0.00–0.07)
Basophils Absolute: 0 10*3/uL (ref 0.0–0.1)
Basophils Relative: 0 %
Eosinophils Absolute: 0.5 10*3/uL (ref 0.0–0.5)
Eosinophils Relative: 4 %
HCT: 33 % — ABNORMAL LOW (ref 36.0–46.0)
Hemoglobin: 11.2 g/dL — ABNORMAL LOW (ref 12.0–15.0)
Immature Granulocytes: 1 %
Lymphocytes Relative: 16 %
Lymphs Abs: 1.8 10*3/uL (ref 0.7–4.0)
MCH: 31.5 pg (ref 26.0–34.0)
MCHC: 33.9 g/dL (ref 30.0–36.0)
MCV: 93 fL (ref 80.0–100.0)
Monocytes Absolute: 0.7 10*3/uL (ref 0.1–1.0)
Monocytes Relative: 7 %
Neutro Abs: 8.2 10*3/uL — ABNORMAL HIGH (ref 1.7–7.7)
Neutrophils Relative %: 72 %
Platelets: 336 10*3/uL (ref 150–400)
RBC: 3.55 MIL/uL — ABNORMAL LOW (ref 3.87–5.11)
RDW: 11.9 % (ref 11.5–15.5)
WBC: 11.3 10*3/uL — ABNORMAL HIGH (ref 4.0–10.5)
nRBC: 0 % (ref 0.0–0.2)

## 2019-12-01 LAB — BASIC METABOLIC PANEL
Anion gap: 10 (ref 5–15)
BUN: 16 mg/dL (ref 8–23)
CO2: 28 mmol/L (ref 22–32)
Calcium: 8.8 mg/dL — ABNORMAL LOW (ref 8.9–10.3)
Chloride: 94 mmol/L — ABNORMAL LOW (ref 98–111)
Creatinine, Ser: 1.32 mg/dL — ABNORMAL HIGH (ref 0.44–1.00)
GFR, Estimated: 42 mL/min — ABNORMAL LOW (ref >60)
Glucose, Bld: 182 mg/dL — ABNORMAL HIGH (ref 70–99)
Potassium: 4.2 mmol/L (ref 3.5–5.1)
Sodium: 132 mmol/L — ABNORMAL LOW (ref 135–145)

## 2019-12-01 LAB — GLUCOSE, CAPILLARY
Glucose-Capillary: 159 mg/dL — ABNORMAL HIGH (ref 70–99)
Glucose-Capillary: 190 mg/dL — ABNORMAL HIGH (ref 70–99)
Glucose-Capillary: 180 mg/dL — ABNORMAL HIGH (ref 70–99)
Glucose-Capillary: 129 mg/dL — ABNORMAL HIGH (ref 70–99)

## 2019-12-01 MED ORDER — SODIUM CHLORIDE 0.45 % IV SOLN: INTRAVENOUS | Status: DC

## 2019-12-01 NOTE — Plan of Care (Signed)
  Problem: Consults Goal: RH STROKE PATIENT EDUCATION Description: See Patient Education module for education specifics  Outcome: Progressing Goal: Nutrition Consult-if indicated Outcome: Progressing Goal: Diabetes Guidelines if Diabetic/Glucose > 140 Description: If diabetic or lab glucose is > 140 mg/dl - Initiate Diabetes/Hyperglycemia Guidelines & Document Interventions  Outcome: Progressing   Problem: RH BOWEL ELIMINATION Goal: RH STG MANAGE BOWEL WITH ASSISTANCE Description: STG Manage Bowel with mod I Assistance. Outcome: Progressing   Problem: RH BLADDER ELIMINATION Goal: RH STG MANAGE BLADDER WITH ASSISTANCE Description: STG Manage Bladder With min Assistance Outcome: Progressing   Problem: RH SKIN INTEGRITY Goal: RH STG MAINTAIN SKIN INTEGRITY WITH ASSISTANCE Description: STG Maintain Skin Integrity With mod I Assistance. Outcome: Progressing   Problem: RH SAFETY Goal: RH STG ADHERE TO SAFETY PRECAUTIONS W/ASSISTANCE/DEVICE Description: STG Adhere to Safety Precautions With mod I Assistance/Device. Outcome: Progressing   Problem: RH PAIN MANAGEMENT Goal: RH STG PAIN MANAGED AT OR BELOW PT'S PAIN GOAL Description: Pain level less than 3 on scale of 0-10 Outcome: Progressing   Problem: RH KNOWLEDGE DEFICIT Goal: RH STG INCREASE KNOWLEDGE OF DIABETES Description: Pt will be able to demonstrate understanding of medication regimen, dietary and lifestyle modifications to better control blood glucose levels using handouts and booklet provided with mod I assist.  Outcome: Progressing Goal: RH STG INCREASE KNOWLEDGE OF HYPERTENSION Description: Pt will be able to demonstrate understanding of medication regimen, dietary and lifestyle modifications to better control blood pressure and prevent stroke using handouts and booklet provided with mod I assist.  Outcome: Progressing Goal: RH STG INCREASE KNOWLEGDE OF HYPERLIPIDEMIA Description: Pt will be able to demonstrate  understanding of medication regimen, dietary and lifestyle modifications to better control cholesterol levels using handouts and booklet provided with mod I assist.  Outcome: Progressing Goal: RH STG INCREASE KNOWLEDGE OF STROKE PROPHYLAXIS Description: Pt will be able to demonstrate understanding of medication regimen, dietary and lifestyle modifications to better control blood pressure and prevent stroke using handouts and booklet provided with mod I assist.  Outcome: Progressing   

## 2019-12-01 NOTE — Plan of Care (Signed)
  Problem: Consults Goal: RH STROKE PATIENT EDUCATION Description: See Patient Education module for education specifics  Outcome: Progressing Goal: Nutrition Consult-if indicated Outcome: Progressing Goal: Diabetes Guidelines if Diabetic/Glucose > 140 Description: If diabetic or lab glucose is > 140 mg/dl - Initiate Diabetes/Hyperglycemia Guidelines & Document Interventions  Outcome: Progressing   Problem: RH BOWEL ELIMINATION Goal: RH STG MANAGE BOWEL WITH ASSISTANCE Description: STG Manage Bowel with mod I Assistance. Outcome: Progressing   Problem: RH BLADDER ELIMINATION Goal: RH STG MANAGE BLADDER WITH ASSISTANCE Description: STG Manage Bladder With min Assistance Outcome: Progressing   Problem: RH SKIN INTEGRITY Goal: RH STG MAINTAIN SKIN INTEGRITY WITH ASSISTANCE Description: STG Maintain Skin Integrity With mod I Assistance. Outcome: Progressing   Problem: RH SAFETY Goal: RH STG ADHERE TO SAFETY PRECAUTIONS W/ASSISTANCE/DEVICE Description: STG Adhere to Safety Precautions With mod I Assistance/Device. Outcome: Progressing   Problem: RH PAIN MANAGEMENT Goal: RH STG PAIN MANAGED AT OR BELOW PT'S PAIN GOAL Description: Pain level less than 3 on scale of 0-10 Outcome: Progressing   Problem: RH KNOWLEDGE DEFICIT Goal: RH STG INCREASE KNOWLEDGE OF DIABETES Description: Pt will be able to demonstrate understanding of medication regimen, dietary and lifestyle modifications to better control blood glucose levels using handouts and booklet provided with mod I assist.  Outcome: Progressing Goal: RH STG INCREASE KNOWLEDGE OF HYPERTENSION Description: Pt will be able to demonstrate understanding of medication regimen, dietary and lifestyle modifications to better control blood pressure and prevent stroke using handouts and booklet provided with mod I assist.  Outcome: Progressing Goal: RH STG INCREASE KNOWLEGDE OF HYPERLIPIDEMIA Description: Pt will be able to demonstrate  understanding of medication regimen, dietary and lifestyle modifications to better control cholesterol levels using handouts and booklet provided with mod I assist.  Outcome: Progressing Goal: RH STG INCREASE KNOWLEDGE OF STROKE PROPHYLAXIS Description: Pt will be able to demonstrate understanding of medication regimen, dietary and lifestyle modifications to better control blood pressure and prevent stroke using handouts and booklet provided with mod I assist.  Outcome: Progressing   Problem: RH KNOWLEDGE DEFICIT Goal: RH STG INCREASE KNOWLEDGE OF DIABETES Description: Pt will be able to demonstrate understanding of medication regimen, dietary and lifestyle modifications to better control blood glucose levels using handouts and booklet provided with mod I assist.  Outcome: Progressing Goal: RH STG INCREASE KNOWLEDGE OF HYPERTENSION Description: Pt will be able to demonstrate understanding of medication regimen, dietary and lifestyle modifications to better control blood pressure and prevent stroke using handouts and booklet provided with mod I assist.  Outcome: Progressing Goal: RH STG INCREASE KNOWLEGDE OF HYPERLIPIDEMIA Description: Pt will be able to demonstrate understanding of medication regimen, dietary and lifestyle modifications to better control cholesterol levels using handouts and booklet provided with mod I assist.  Outcome: Progressing Goal: RH STG INCREASE KNOWLEDGE OF STROKE PROPHYLAXIS Description: Pt will be able to demonstrate understanding of medication regimen, dietary and lifestyle modifications to better control blood pressure and prevent stroke using handouts and booklet provided with mod I assist.  Outcome: Progressing

## 2019-12-01 NOTE — Progress Notes (Signed)
[place] PHYSICAL MEDICINE & REHABILITATION PROGRESS NOTE   Subjective/Complaints: Daughter is concerned that transfers were more difficult today.  The patient feels weak but has no other specific complaints.  She was dizzy earlier today.  Her intake has been poor Poor intake and nausea persists ROS:   Pt denies SOB, abd pain, CP, N/V/C/D, and vision changes  Objective:   No results found. No results for input(s): WBC, HGB, HCT, PLT in the last 72 hours. Recent Labs    11/30/19 0437  CREATININE 1.26*    Intake/Output Summary (Last 24 hours) at 12/01/2019 1414 Last data filed at 12/01/2019 0745 Gross per 24 hour  Intake 80 ml  Output 200 ml  Net -120 ml   Physical Exam: Vital Signs Blood pressure (!) 159/57, pulse 69, temperature 98.2 F (36.8 C), temperature source Oral, resp. rate 20, height _0  (1.702 m), weight 65.5 kg, SpO2 93 %.    General: No acute distress Mood and affect are appropriate Heart: Regular rate and rhythm no rubs murmurs or extra sounds Lungs: Clear to auscultation, breathing unlabored, no rales or wheezes Abdomen: Positive bowel sounds, soft nontender to palpation, nondistended Extremities: No clubbing, cyanosis, or edema Skin: No evidence of breakdown, no evidence of rash  Neuro: eyes opened. Follows commands. Initiates conversation and demonstrates fair insight and awareness. Poor visual acuity/depth perception when eyes are open. 4/5 RUE, LUE, LLE, RLE  Senses LT in all 4's.  Musculoskeletal:mild shoulder pain mainly at night    Assessment/Plan: 1. Functional deficits secondary to right PCA infarct which require 3+ hours per day of interdisciplinary therapy in a comprehensive inpatient rehab setting.  Physiatrist is providing close team supervision and 24 hour management of active medical problems listed below.  Physiatrist and rehab team continue to assess barriers to discharge/monitor patient progress toward functional and medical  goals  Care Tool:  Bathing    Body parts bathed by patient: Right arm, Left arm, Chest, Abdomen, Front perineal area, Right upper leg, Left upper leg, Face, Buttocks, Right lower leg, Left lower leg   Body parts bathed by helper: Buttocks Body parts n/a: Right lower leg, Left lower leg   Bathing assist Assist Level: Minimal Assistance - Patient > 75%     Upper Body Dressing/Undressing Upper body dressing   What is the patient wearing?: Pull over shirt    Upper body assist Assist Level: Set up assist    Lower Body Dressing/Undressing Lower body dressing      What is the patient wearing?: Incontinence brief     Lower body assist Assist for lower body dressing: Minimal Assistance - Patient > 75%     Toileting Toileting    Toileting assist Assist for toileting: Minimal Assistance - Patient > 75%     Transfers Chair/bed transfer  Transfers assist  Chair/bed transfer activity did not occur: Safety/medical concerns  Chair/bed transfer assist level: Minimal Assistance - Patient > 75%     Locomotion Ambulation   Ambulation assist   Ambulation activity did not occur: Safety/medical concerns  Assist level: Minimal Assistance - Patient > 75% Assistive device: Walker-rolling Max distance: 36f   Walk 10 feet activity   Assist  Walk 10 feet activity did not occur: Safety/medical concerns  Assist level: Minimal Assistance - Patient > 75% Assistive device: Walker-rolling   Walk 50 feet activity   Assist Walk 50 feet with 2 turns activity did not occur: Safety/medical concerns  Assist level: Minimal Assistance - Patient > 75% Assistive device:  Walker-rolling    Walk 150 feet activity   Assist Walk 150 feet activity did not occur: Safety/medical concerns         Walk 10 feet on uneven surface  activity   Assist Walk 10 feet on uneven surfaces activity did not occur: Safety/medical concerns         Wheelchair     Assist Will patient use  wheelchair at discharge?: Yes Type of Wheelchair: Manual Wheelchair activity did not occur: Safety/medical concerns (pt unable to participate in [facility] assessment this date 2/2 severe nausea and vomiting and unable to transfer OOB at this time.)         Wheelchair 50 feet with 2 turns activity    Assist    Wheelchair 50 feet with 2 turns activity did not occur: Safety/medical concerns       Wheelchair 150 feet activity     Assist  Wheelchair 150 feet activity did not occur: Safety/medical concerns       Blood pressure (!) 159/57, pulse 69, temperature 98.2 F (36.8 C), temperature source Oral, resp. rate 20, height _0  (1.702 m), weight 65.5 kg, SpO2 93 %.  Medical Problem List and Plan: 1.  Decreased functional mobility with falls loss of vision left peripheral field secondary to right PCA infarction in the setting of left vertebral artery occlusion along with severe left ICA stenosis with previously silent subacute left parietal infarct secondary to large vessel disease.             -patient may shower             -ELOS/Goals: d/c to [facility], 4E on 11/22 /Min/Mod A      Continue CIR, PT, OT 2.  Antithrombotics: -DVT/anticoagulation: Lovenox             -antiplatelet therapy: Aspirin 81 mg daily and Plavix 25 mg daily x3 months then Plavix alone 3. Pain Management: Lyrica 25 mg daily, Hx post herpetic neuralgia, skin rxn to  lidocaine patch again will d/c, increase lyrica to 100 mg. Improved sleep last noc. Pain is currently well controlled- sleeping well    discussed calcific tendinopathy XR results with pt and daughte r- pt without movement related pain so xray finding are not cause of symptoms  11/13- no complaints of pain today- con't regimen 4. Mood: Provide emotional support, family supportive             -antipsychotic agents: N/A 5. Neuropsych: This patient is capable of making decisions on her own behalf. 6. Skin/Wound Care: Routine skin checks 7.  Fluids/Electrolytes/Nutrition: intake poor to inconsistent d/t nausea  -encouraged her to push po as much as she can. 8.  Hypertension.  Coreg 12.5 mg twice daily.  Monitor with increased mobility.    Vitals:   12/01/19 0158 12/01/19 0507  BP: (!) 142/60 (!) 159/57  Pulse: 68 69  Resp:  20  Temp: 99 F (37.2 C) 98.2 F (36.8 C)  SpO2:  93%  BPs with elevated systolic this am , increase hytrin, check orthostatic vitals 9.  Diabetes mellitus peripheral neuropathy.  Hemoglobin A1c 10.1.  SSI.  Patient on Glucophage 500 mg twice daily prior to admission.  Resume as needed             CBG (last 3)  Recent Labs    11/30/19 2115 12/01/19 0554 12/01/19 1128  GLUCAP 110* 159* 190*  Cont metformin  well controlled 11/20 10.  Hyperlipidemia.  Lipitor 12.  Tobacco abuse.  Counseling 13.  Vestibular sx: added scopolamine patch with some improvement. Less limited Overall improved, dizziness may be to some degree related to orthostatic hypotension.  May wean scopolamine 14.  Urinary tract infx- e coli sens to nitrofurantoin  15. Constipation  Increased colace, small cont BM last noc  #16.  Overall weakness according to daughter do not see any therapy notes.  Neuro exam is unchanged.  Review of oral intake, overall poor the last few days will check be met.  Also check CBC.  May need IV fluids.    LOS: 22 days A FACE TO FACE EVALUATION WAS PERFORMED  Charlett Blake 12/01/2019, 2:14 PM

## 2019-12-01 NOTE — Progress Notes (Signed)
Physical Therapy Discharge Summary  Patient Details  Name: Chelsea [name] MRN: [id number] Date of Birth: [birth year]  Today's Date: 12/02/2019  Patient has met 5 of 11 long term goals due to improved activity tolerance, improved balance, improved postural control, increased strength, ability to compensate for deficits, improved attention and improved awareness.  Patient to discharge at an ambulatory level [place].   Patient's care partner is independent to provide the necessary physical and cognitive assistance at discharge.  Reasons goals not met: Pt did not meet all rehab goals due to inconsistent presentation. Pt's ability level ranged from CGA to mod A.  Recommendation:  Patient will benefit from ongoing skilled PT services in home health setting to continue to advance safe functional mobility, address ongoing impairments in balance, coordination, safety, gait, transfers, strength, posture, midline orientation, and minimize fall risk.  Equipment: WC.   Reasons for discharge: treatment goals met and discharge from hospital  Patient/family agrees with progress made and goals achieved: Yes  PT Discharge Precautions/Restrictions Precautions Precautions: Fall Restrictions Weight Bearing Restrictions: No Vision/Perception  Perception Perception: Impaired Inattention/Neglect: Does not attend to left visual field;Does not attend to right side of body Praxis Praxis: Impaired Praxis Impairment Details: Perseveration;Motor planning  Cognition Overall Cognitive Status: History of cognitive impairments - at baseline Arousal/Alertness: Awake/alert Orientation Level: Oriented X4 Sustained Attention: Impaired Memory: Impaired Problem Solving: Impaired Safety/Judgment: Impaired Sensation Sensation Light Touch: Impaired by gross assessment Light Touch Impaired Details: Impaired RLE;Impaired LLE Proprioception: Impaired by gross assessment Coordination Gross Motor Movements are  Fluid and Coordinated: No Fine Motor Movements are Fluid and Coordinated: No Motor  Motor Motor: Hemiplegia;Ataxia;Abnormal postural alignment and control Motor - Discharge Observations: Greatly improved since eval  Mobility Bed Mobility Bed Mobility: Rolling Right;Rolling Left;Sit to Supine;Supine to Sit Rolling Right: Supervision/verbal cueing Rolling Left: Supervision/Verbal cueing Supine to Sit: Contact Guard/Touching assist Sitting - Scoot to Edge of Bed: Contact Guard/Touching assist Sit to Supine: Contact Guard/Touching assist Transfers Transfers: Sit to Stand;Stand Pivot Transfers Sit to Stand: Contact Guard/Touching assist Stand Pivot Transfers: Contact Guard/Touching assist Stand Pivot Transfer Details: Verbal cues for gait pattern;Verbal cues for safe use of DME/AE;Manual facilitation for weight shifting;Manual facilitation for placement Transfer (Assistive device): Rolling walker Locomotion  Gait Ambulation: Yes Gait Assistance: Minimal Assistance - Patient > 75% Gait Distance (Feet): 65 Feet Assistive device: Rolling walker Gait Gait: Yes Stairs / Additional Locomotion Stairs: No Wheelchair Mobility Wheelchair Mobility: No  Trunk/Postural Assessment  Cervical Assessment Cervical Assessment: Exceptions to [facility] (forward head) Thoracic Assessment Thoracic Assessment: Exceptions to [facility] (rounded shoulders) Lumbar Assessment Lumbar Assessment: Exceptions to [facility] (posterior pelvic tilt) Postural Control Postural Control: Deficits on evaluation  Balance Balance Balance Assessed: Yes Static Sitting Balance Static Sitting - Level of Assistance: 5: Stand by assistance Dynamic Sitting Balance Dynamic Sitting - Level of Assistance: 5: Stand by assistance Static Standing Balance Static Standing - Level of Assistance: 4: Min assist Dynamic Standing Balance Dynamic Standing - Level of Assistance: 4: Min assist Extremity Assessment   RLE Assessment RLE Assessment:  Exceptions to [facility] RLE Strength Right Hip Flexion: 4-/5 Right Hip ABduction: 4/5 Right Hip ADduction: 4-/5 Right Knee Extension: 4-/5 Right Ankle Dorsiflexion: 3+/5 Right Ankle Plantar Flexion: 4-/5 LLE Assessment LLE Assessment: Exceptions to [facility] LLE Strength Left Hip Flexion: 4/5 Left Hip ABduction: 4/5 Left Hip ADduction: 4/5 Left Knee Extension: 4/5 Left Ankle Dorsiflexion: 4/5 Left Ankle Plantar Flexion: 4/5     Excell Seltzer, PT, DPT 12/02/2019, 5:16 PM

## 2019-12-01 NOTE — Progress Notes (Signed)
Physical Therapy Session Note  Patient Details  Name: Chelsea [name] MRN: [id number] Date of Birth: [birth year]  Today's Date: 12/01/2019 PT Individual Time:850-932   42 min   Short Term Goals: Week 3:  PT Short Term Goal 1 (Week 3): pt will ambulate 167f with min assist and LRAD PT Short Term Goal 2 (Week 3): pt will perform bed <> chair transfer with LRAD and CGA PT Short Term Goal 3 (Week 3): pt will perfrom sit<>stand with CGA  Skilled Therapeutic Interventions/Progress Updates:    Pt received supine in bed and agreeable to PT.  Pt's daughter present and reporting that overnight pt was having new CVA like s/s with increased ataxia and decreased use of the RLE.  PT performed gross neuro assessment and pt found to have relatively unchanged strength and coordination in the RUE/RLE compared to previous day. Supine>sit transfer with mod assist cues for use of bed rails on the R side. Pt able to sit with supervision assist EOB, but reports continued nausea.    Orthostatic Vital sign assessment.: 157/59, 70. Supine.   136/60, 72, sitting  115/52, 77. Standing. Orthostatic s/s present.   PT applied binder  156/63. Sitting  124/58. standing  Stand pivot [facility] to [facility] assist and moderate cues for sequencing. And AD management voided bowel and bladder on toilet. Standing balance x 4 minutes with min assist and UE support on RW while daughter performed peri care.   Pt returned to room and performed stand pivot transfer to bed with RW and mod assist and max cues for posture, improved step length and Ad management. Sit>supine completed with min assist at the RLE,  and left supine in bed with call bell in reach and all needs met.       Therapy Documentation Precautions:  Precautions Precautions: Fall Restrictions Weight Bearing Restrictions: No Vital Signs: Therapy Vitals Temp: 98.2 F (36.8 C) Temp Source: Oral Pulse Rate: 69 Resp: 20 BP: (!) 159/57 Patient Position  (if appropriate): Lying Oxygen Therapy SpO2: 93 % O2 Device: Room Air Pain: Denies at rest    Therapy/Group: Individual Therapy  ALorie Phenix11/20/2021, 8:58 AM

## 2019-12-02 ENCOUNTER — Inpatient Hospital Stay (HOSPITAL_COMMUNITY): Payer: PPO | Admitting: Physical Therapy

## 2019-12-02 ENCOUNTER — Inpatient Hospital Stay (HOSPITAL_COMMUNITY): Payer: PPO | Admitting: Occupational Therapy

## 2019-12-02 LAB — GLUCOSE, CAPILLARY
Glucose-Capillary: 119 mg/dL — ABNORMAL HIGH (ref 70–99)
Glucose-Capillary: 141 mg/dL — ABNORMAL HIGH (ref 70–99)
Glucose-Capillary: 103 mg/dL — ABNORMAL HIGH (ref 70–99)
Glucose-Capillary: 131 mg/dL — ABNORMAL HIGH (ref 70–99)

## 2019-12-02 MED ORDER — VANCOMYCIN HCL 1250 MG/250ML IV SOLN
1250.0000 mg | INTRAVENOUS | Status: DC
  Filled 2019-12-02: qty 250

## 2019-12-02 NOTE — H&P (View-Only) (Signed)
   VASCULAR SURGERY ASSESSMENT & PLAN:   GREATER THAN 80% LEFT CAROTID STENOSIS: I agree with Dr. Early's assessment that she would benefit from left carotid endarterectomy.  I reviewed her duplex scan and CT scan and she appears to be a good candidate for left carotid endarterectomy in order to lower her risk of stroke.  I have again reviewed the indications for the procedure and the potential complications with the patient and her daughter.  I explained that there is a 1 to 2% risk of perioperative stroke.  Other risks include MI nerve injury wound healing problems and bleeding.  Given that she has remained on Plavix she is at slightly increased risk for bleeding.  All of her questions were answered and she is agreeable to proceed.  Preop orders written.  SUBJECTIVE:   She had been dehydrated and now feels much better since she has received IV fluid.  PHYSICAL EXAM:   Vitals:   12/01/19 0158 12/01/19 0507 12/01/19 2016 12/02/19 0601  BP: (!) 142/60 (!) 159/57 (!) 130/56 (!) 146/58  Pulse: 68 69 63 64  Resp:  20 20   Temp: 99 F (37.2 C) 98.2 F (36.8 C) 98.6 F (37 C) 98.1 F (36.7 C)  TempSrc: Oral Oral Oral Oral  SpO2:  93% 92% 93%  Weight:      Height:       NEURO: No focal weakness or paresthesias.  LABS:   Lab Results  Component Value Date   WBC 11.3 (H) 12/01/2019   HGB 11.2 (L) 12/01/2019   HCT 33.0 (L) 12/01/2019   MCV 93.0 12/01/2019   PLT 336 12/01/2019   Lab Results  Component Value Date   CREATININE 1.32 (H) 12/01/2019   Lab Results  Component Value Date   INR 1.0 11/08/2019   CBG (last 3)  Recent Labs    12/01/19 1623 12/01/19 2241 12/02/19 0603  GLUCAP 180* 129* 119*    PROBLEM LIST:    Active Problems:   Acute ischemic right posterior cerebral artery (PCA) stroke (HCC)   Pain   Stroke (HCC)   CURRENT MEDS:   . amLODipine  10 mg Oral Daily  . aspirin EC  81 mg Oral Daily  . atorvastatin  80 mg Oral Daily  . carvedilol  3.125 mg  Oral Daily  . clopidogrel  75 mg Oral Daily  . enoxaparin (LOVENOX) injection  40 mg Subcutaneous Daily  . hydroxypropyl methylcellulose / hypromellose  1 drop Left Eye TID  . insulin aspart  0-15 Units Subcutaneous TID AC & HS  . lisinopril  2.5 mg Oral Daily  . metFORMIN  500 mg Oral BID WC  . multivitamin with minerals  1 tablet Oral Daily  . nitrofurantoin (macrocrystal-monohydrate)  100 mg Oral Q12H  . ondansetron  4 mg Oral Q12H   Or  . ondansetron (ZOFRAN) IV  4 mg Intravenous Q12H  . polyethylene glycol  17 g Oral Daily  . pregabalin  100 mg Oral QPM  . Ensure Max Protein  11 oz Oral Daily  . scopolamine  1 patch Transdermal Q72H  . senna-docusate  2 tablet Oral BID  . terazosin  2 mg Oral QHS    Chelsea [name] Office: [phone] 12/02/2019  

## 2019-12-02 NOTE — Discharge Summary (Signed)
Physician Discharge Summary  Patient ID: HEYDY MONTILLA MRN: [id number] DOB/AGE: 1945/02/25 74 y.o.  Admit date: 11/09/2019 Discharge date: 12/03/2019  Discharge Diagnoses:  Active Problems:   Acute ischemic right posterior cerebral artery (PCA) stroke (HCC)   Pain   Stroke (HCC) DVT prophylaxis Pain management Carotid stenosis Hypertension Diabetes mellitus with peripheral neuropathy Hyperlipidemia Tobacco use E. coli UTI Constipation  Discharged Condition: Stable  Significant Diagnostic Studies: CT ANGIO HEAD W OR WO CONTRAST  Addendum Date: 11/07/2019   ADDENDUM REPORT: 11/07/2019 09:10 ADDENDUM: These results were called by telephone at the time of interpretation on 11/07/2019 at 9:10 am to provider Shanker, who verbally acknowledged these results. Electronically Signed   By: Marlan Palau M.D.   On: 11/07/2019 09:10   Result Date: 11/07/2019 CLINICAL DATA:  Dizziness.  Intractable vomiting.  Hypertension. EXAM: CT ANGIOGRAPHY HEAD AND NECK TECHNIQUE: Multidetector CT imaging of the head and neck was performed using the standard protocol during bolus administration of intravenous contrast. Multiplanar CT image reconstructions and MIPs were obtained to evaluate the vascular anatomy. Carotid stenosis measurements (when applicable) are obtained utilizing NASCET criteria, using the distal internal carotid diameter as the denominator. CONTRAST:  75mL OMNIPAQUE IOHEXOL 350 MG/ML SOLN COMPARISON:  CT head 10/30/2019 FINDINGS: CT HEAD FINDINGS Brain: Ill-defined hypodensity in the right medial temporal lobe lateral to the temporal horn has progressed since the CT of 10/30/2019. This is most consistent with subacute infarct. Mild atrophy. Negative for hydrocephalus. Patchy will chronic microvascular ischemic change in the white matter. Chronic lacunar infarction right caudate unchanged. Negative for intracranial hemorrhage or mass. Vascular: Negative for hyperdense vessel Skull: Negative  Sinuses: Paranasal sinuses clear. Orbits: Negative Review of the MIP images confirms the above findings CTA NECK FINDINGS Aortic arch: Advanced atherosclerotic disease in the aortic arch and proximal great vessels. 50% diameter stenosis proximal left subclavian artery. Right carotid system: Atherosclerotic disease throughout the right common carotid artery extending to the carotid bifurcation and internal carotid artery. No significant stenosis. Left carotid system: Atherosclerotic disease left carotid bifurcation. Noncalcified focal stenosis proximal left internal carotid artery estimated 80% diameter stenosis. Vertebral arteries: Right vertebral artery is patent to the basilar without significant stenosis. Left vertebral artery is occluded at the origin with reconstitution at the skull base. Skeleton: No acute skeletal abnormality. Other neck: Negative Upper chest: Lung apices clear bilaterally. Review of the MIP images confirms the above findings CTA HEAD FINDINGS Anterior circulation: Atherosclerotic disease throughout the cavernous carotid bilaterally with mild to moderate stenosis. Anterior and middle cerebral arteries are patent without large vessel occlusion. There is diffuse irregularity and atherosclerotic disease in the anterior and middle cerebral arteries without focal flow limiting stenosis. Posterior circulation: Right vertebral artery supplies the basilar with atherosclerotic irregularity distally but no significant stenosis. There is opacification distal left vertebral artery which is diffusely diseased. This is likely retrograde flow. PICA patent bilaterally. Diffuse atherosclerotic irregularity in the basilar with mild stenosis in the midportion. Superior cerebellar arteries are patent bilaterally. Both posterior cerebral artery patent with diffuse atherosclerotic irregularity and moderate stenosis. Venous sinuses: Normal venous enhancement Anatomic variants: None Review of the MIP images confirms  the above findings IMPRESSION: 1. Ill-defined hypodensity right medial temporal lobe has progressed since the recent CT of 10/30/2019 and most consistent with subacute infarct. Recommend MRI head for further evaluation. 2. Occlusion left vertebral artery at the origin. Chronicity of this occlusion is uncertain. Right vertebral artery widely patent to the basilar. 3. 80% focal noncalcified stenosis proximal left  internal carotid artery. 4. Atherosclerotic disease throughout the right carotid artery without significant stenosis. 5. Diffuse intracranial atherosclerotic disease most severe in the posterior cerebral arteries bilaterally. Electronically Signed: By: Marlan Palauharles  Clark M.D. On: 11/07/2019 08:27   CT ANGIO NECK W OR WO CONTRAST  Addendum Date: 11/07/2019   ADDENDUM REPORT: 11/07/2019 09:10 ADDENDUM: These results were called by telephone at the time of interpretation on 11/07/2019 at 9:10 am to provider Shanker, who verbally acknowledged these results. Electronically Signed   By: Marlan Palauharles  Clark M.D.   On: 11/07/2019 09:10   Result Date: 11/07/2019 CLINICAL DATA:  Dizziness.  Intractable vomiting.  Hypertension. EXAM: CT ANGIOGRAPHY HEAD AND NECK TECHNIQUE: Multidetector CT imaging of the head and neck was performed using the standard protocol during bolus administration of intravenous contrast. Multiplanar CT image reconstructions and MIPs were obtained to evaluate the vascular anatomy. Carotid stenosis measurements (when applicable) are obtained utilizing NASCET criteria, using the distal internal carotid diameter as the denominator. CONTRAST:  75mL OMNIPAQUE IOHEXOL 350 MG/ML SOLN COMPARISON:  CT head 10/30/2019 FINDINGS: CT HEAD FINDINGS Brain: Ill-defined hypodensity in the right medial temporal lobe lateral to the temporal horn has progressed since the CT of 10/30/2019. This is most consistent with subacute infarct. Mild atrophy. Negative for hydrocephalus. Patchy will chronic microvascular ischemic  change in the white matter. Chronic lacunar infarction right caudate unchanged. Negative for intracranial hemorrhage or mass. Vascular: Negative for hyperdense vessel Skull: Negative Sinuses: Paranasal sinuses clear. Orbits: Negative Review of the MIP images confirms the above findings CTA NECK FINDINGS Aortic arch: Advanced atherosclerotic disease in the aortic arch and proximal great vessels. 50% diameter stenosis proximal left subclavian artery. Right carotid system: Atherosclerotic disease throughout the right common carotid artery extending to the carotid bifurcation and internal carotid artery. No significant stenosis. Left carotid system: Atherosclerotic disease left carotid bifurcation. Noncalcified focal stenosis proximal left internal carotid artery estimated 80% diameter stenosis. Vertebral arteries: Right vertebral artery is patent to the basilar without significant stenosis. Left vertebral artery is occluded at the origin with reconstitution at the skull base. Skeleton: No acute skeletal abnormality. Other neck: Negative Upper chest: Lung apices clear bilaterally. Review of the MIP images confirms the above findings CTA HEAD FINDINGS Anterior circulation: Atherosclerotic disease throughout the cavernous carotid bilaterally with mild to moderate stenosis. Anterior and middle cerebral arteries are patent without large vessel occlusion. There is diffuse irregularity and atherosclerotic disease in the anterior and middle cerebral arteries without focal flow limiting stenosis. Posterior circulation: Right vertebral artery supplies the basilar with atherosclerotic irregularity distally but no significant stenosis. There is opacification distal left vertebral artery which is diffusely diseased. This is likely retrograde flow. PICA patent bilaterally. Diffuse atherosclerotic irregularity in the basilar with mild stenosis in the midportion. Superior cerebellar arteries are patent bilaterally. Both posterior  cerebral artery patent with diffuse atherosclerotic irregularity and moderate stenosis. Venous sinuses: Normal venous enhancement Anatomic variants: None Review of the MIP images confirms the above findings IMPRESSION: 1. Ill-defined hypodensity right medial temporal lobe has progressed since the recent CT of 10/30/2019 and most consistent with subacute infarct. Recommend MRI head for further evaluation. 2. Occlusion left vertebral artery at the origin. Chronicity of this occlusion is uncertain. Right vertebral artery widely patent to the basilar. 3. 80% focal noncalcified stenosis proximal left internal carotid artery. 4. Atherosclerotic disease throughout the right carotid artery without significant stenosis. 5. Diffuse intracranial atherosclerotic disease most severe in the posterior cerebral arteries bilaterally. Electronically Signed: By: Delorise Jacksonharles  Clark M.D.  On: 11/07/2019 08:27   MR BRAIN WO CONTRAST  Addendum Date: 11/07/2019   ADDENDUM REPORT: 11/07/2019 09:10 ADDENDUM: These results were called by telephone at the time of interpretation on 11/07/2019 at 9:09 am to provider Shanker, who verbally acknowledged these results. Electronically Signed   By: Marlan Palau M.D.   On: 11/07/2019 09:10   Result Date: 11/07/2019 CLINICAL DATA:  Acute neuro deficit. Stroke. Hypertension and diabetes. EXAM: MRI HEAD WITHOUT CONTRAST TECHNIQUE: Multiplanar, multiecho pulse sequences of the brain and surrounding structures were obtained without intravenous contrast. COMPARISON:  CT angio head and neck 11/07/2019 FINDINGS: Brain: Restricted diffusion in the right hippocampus compatible with acute/subacute infarct. This corresponds to the CT hypodensity. No associated hemorrhage. No other areas of restricted diffusion. Patchy white matter chronic ischemic changes are noted. There is facilitated diffusion in the left parietal infarct which may be late subacute or chronic. Ventricle size normal. Cerebral volume normal  for age. Negative for mass lesion. Chronic microhemorrhage right cerebellum. Mild chronic hemorrhage in the left parietal infarct. Vascular: Normal arterial flow voids. Skull and upper cervical spine: Negative Sinuses/Orbits: Paranasal sinuses clear.  Negative orbit. Other: None IMPRESSION: Acute or subacute infarct in the right hippocampus without hemorrhage Mild chronic ischemic changes. Areas of chronic microhemorrhage including in the left parietal infarct which is small. Electronically Signed: By: Marlan Palau M.D. On: 11/07/2019 08:52   DG Shoulder Left  Result Date: 11/17/2019 CLINICAL DATA:  Left shoulder neuropathic pain.  No reported injury. EXAM: LEFT SHOULDER - 2+ VIEW COMPARISON:  None. FINDINGS: No fracture. No glenohumeral dislocation. No evidence of acromioclavicular separation. No suspicious focal osseous lesions. No significant arthropathy. Tiny soft tissue calcifications adjacent to the posterolateral margin of the greater tuberosity. No radiopaque foreign bodies. IMPRESSION: Tiny soft tissue calcifications adjacent to the posterolateral margin of the greater tuberosity, suggesting calcific tendinopathy/bursitis. No acute osseous abnormality. Electronically Signed   By: Delbert Phenix M.D.   On: 11/17/2019 17:07   DG Abd 2 Views  Result Date: 11/07/2019 CLINICAL DATA:  3 day history of nausea and vomiting. EXAM: ABDOMEN - 2 VIEW COMPARISON:  No comparison studies available. FINDINGS: Gas-filled central small bowel loops are nondilated. Air in stool are seen scattered along the length of a nondilated colon. Atelectasis or scarring noted at the right base. Telemetry leads overlie the abdomen. IMPRESSION: 1. Nonobstructive bowel gas pattern. 2. Atelectasis or scarring at the right lung base. Electronically Signed   By: Kennith Center M.D.   On: 11/07/2019 07:35   ECHOCARDIOGRAM COMPLETE BUBBLE STUDY  Result Date: 11/07/2019    ECHOCARDIOGRAM REPORT   Patient Name:   AWANDA WILCOCK Buda Date  of Exam: 11/07/2019 Medical Rec #:  [id number]      Height:       67.0 in Accession #:    [id number]     Weight:       141.1 lb Date of Birth:  11/14/1945      BSA:          1.744 m Patient Age:    74 years       BP:           199/78 mmHg Patient Gender: F              HR:           59 bpm. Exam Location:  Inpatient Procedure: 2D Echo, Cardiac Doppler, Color Doppler and Saline Contrast Bubble  Study Indications:    Stroke 434.91 / I163.9  History:        Patient has no prior history of Echocardiogram examinations.                 Risk Factors:Hypertension and Diabetes. Cardiac Arrest.  Sonographer:    Tiffany Dance Referring Phys: [id number] Deno Lunger SHALHOU[place] IMPRESSIONS  1. Left ventricular ejection fraction, by estimation, is 60 to 65%. The left ventricle has normal function. The left ventricle has no regional wall motion abnormalities. There is mild asymmetric left ventricular hypertrophy of the basal-septal segment. Left ventricular diastolic parameters are consistent with Grade I diastolic dysfunction (impaired relaxation). Elevated left ventricular end-diastolic pressure.  2. Right ventricular systolic function is normal. The right ventricular size is normal.  3. Left atrial size was moderately dilated.  4. The mitral valve is normal in structure. Trivial mitral valve regurgitation. No evidence of mitral stenosis. Moderate mitral annular calcification.  5. The aortic valve is tricuspid. There is mild calcification of the aortic valve. There is mild thickening of the aortic valve. Aortic valve regurgitation is not visualized. Mild aortic valve sclerosis is present, with no evidence of aortic valve stenosis.  6. The inferior vena cava is normal in size with greater than 50% respiratory variability, suggesting right atrial pressure of 3 mmHg.  7. Agitated saline contrast bubble study was negative, with no evidence of any interatrial shunt. Comparison(s): No prior Echocardiogram.  Conclusion(s)/Recommendation(s): Normal biventricular function without evidence of hemodynamically significant valvular heart disease. No intracardiac source of embolism detected on this transthoracic study. A transesophageal echocardiogram is recommended to exclude cardiac source of embolism if clinically indicated. FINDINGS  Left Ventricle: Left ventricular ejection fraction, by estimation, is 60 to 65%. The left ventricle has normal function. The left ventricle has no regional wall motion abnormalities. The left ventricular internal cavity size was normal in size. There is  mild asymmetric left ventricular hypertrophy of the basal-septal segment. Left ventricular diastolic parameters are consistent with Grade I diastolic dysfunction (impaired relaxation). Elevated left ventricular end-diastolic pressure. Right Ventricle: The right ventricular size is normal. No increase in right ventricular wall thickness. Right ventricular systolic function is normal. Left Atrium: Left atrial size was moderately dilated. Right Atrium: Right atrial size was normal in size. Pericardium: There is no evidence of pericardial effusion. Mitral Valve: The mitral valve is normal in structure. Moderate mitral annular calcification. Trivial mitral valve regurgitation. No evidence of mitral valve stenosis. Tricuspid Valve: The tricuspid valve is normal in structure. Tricuspid valve regurgitation is trivial. No evidence of tricuspid stenosis. Aortic Valve: The aortic valve is tricuspid. There is mild calcification of the aortic valve. There is mild thickening of the aortic valve. Aortic valve regurgitation is not visualized. Mild aortic valve sclerosis is present, with no evidence of aortic valve stenosis. Pulmonic Valve: The pulmonic valve was not well visualized. Pulmonic valve regurgitation is mild. No evidence of pulmonic stenosis. Aorta: The aortic root, ascending aorta and aortic arch are all structurally normal, with no evidence of  dilitation or obstruction. Venous: The inferior vena cava is normal in size with greater than 50% respiratory variability, suggesting right atrial pressure of 3 mmHg. IAS/Shunts: No atrial level shunt detected by color flow Doppler. Agitated saline contrast was given intravenously to evaluate for intracardiac shunting. Agitated saline contrast bubble study was negative, with no evidence of any interatrial shunt.  LEFT VENTRICLE PLAX 2D LVIDd:         4.49 cm  Diastology LVIDs:  3.36 cm  LV e' medial:    3.92 cm/s LV PW:         0.97 cm  LV E/e' medial:  17.0 LV IVS:        1.41 cm  LV e' lateral:   4.90 cm/s LVOT diam:     2.30 cm  LV E/e' lateral: 13.6 LV SV:         107 LV SV Index:   61 LVOT Area:     4.15 cm  RIGHT VENTRICLE             IVC RV Basal diam:  2.89 cm     IVC diam: 1.60 cm RV S prime:     12.50 cm/s TAPSE (M-mode): 2.6 cm LEFT ATRIUM             Index       RIGHT ATRIUM           Index LA diam:        3.90 cm 2.24 cm/m  RA Area:     14.80 cm LA Vol (A2C):   81.5 ml 46.74 ml/m RA Volume:   38.20 ml  21.91 ml/m LA Vol (A4C):   60.2 ml 34.53 ml/m LA Biplane Vol: 70.3 ml 40.32 ml/m  AORTIC VALVE LVOT Vmax:   94.45 cm/s LVOT Vmean:  64.600 cm/s LVOT VTI:    0.258 m  AORTA Ao Root diam: 3.70 cm Ao Asc diam:  3.40 cm MITRAL VALVE MV Area (PHT): 1.87 cm     SHUNTS MV Decel Time: 405 msec     Systemic VTI:  0.26 m MV E velocity: 66.50 cm/s   Systemic Diam: 2.30 cm MV A velocity: 114.00 cm/s MV E/A ratio:  0.58 Jodelle Red MD Electronically signed by Jodelle Red MD Signature Date/Time: 11/07/2019/3:25:37 PM    Final     Labs:  Basic Metabolic Panel: Recent Labs  Lab 11/30/19 0437 12/01/19 1536  NA  --  132*  K  --  4.2  CL  --  94*  CO2  --  28  GLUCOSE  --  182*  BUN  --  16  CREATININE 1.26* 1.32*  CALCIUM  --  8.8*    CBC: Recent Labs  Lab 12/01/19 1536  WBC 11.3*  NEUTROABS 8.2*  HGB 11.2*  HCT 33.0*  MCV 93.0  PLT 336    CBG: Recent Labs   Lab 12/01/19 2241 12/02/19 0603 12/02/19 1128 12/02/19 1658 12/02/19 2045  GLUCAP 129* 119* 141* 103* 131*   Family history. Mother with myocardial infarction Father with CVA. Denies any colon cancer esophageal cancer or rectal cancer  Brief HPI:   Chelsea [name] is a 74 y.o. right-handed female with history of hypertension diabetes mellitus left carotid stenosis 80% was scheduled for carotid enterectomy in late November with Dr. Arbie Cookey tobacco abuse and questionable medical noncompliance. Per chart review independent with assistive device prior to admission. 1 level home 4 steps to entry. She lives with her children and assistance as needed. Presented 11/07/2019 with intractable nausea and vomiting as well as loss of vision left peripheral field. Noted blood pressure 216/79. Patient did have a CT of the head 10/30/2019 after a fall showing no evidence of acute changes. MRI showed acute versus subacute infarct in the right hyper campus without hemorrhage. Mild chronic ischemic changes. CT angiogram of the head and neck ill-defined hypodensity right medial temporal lobe progressed since recent CT of 10/30/2019 most consistent with subacute infarction.  Occlusion left vertebral artery at the origin 80% focal noncalcified stenosis proximal left ICA artery. Admission chemistries unremarkable except glucose 345 BUN 26 urine culture multiple species troponin negative hemoglobin A1c 10.1. Echocardiogram with ejection fraction of 60 to 65% no wall motion abnormalities grade 1 diastolic dysfunction. Maintained on aspirin and Plavix for CVA prophylaxis x3 months then Plavix alone. Subcutaneous Lovenox for DVT prophylaxis. Hospital course urinary retention urology consulted initially Foley tube placed voiding trial is initiated. Therapy evaluations completed and patient was admitted for a comprehensive rehab program   Hospital Course: NAKEYSHA PASQUAL was admitted to rehab 11/09/2019 for inpatient therapies to  consist of PT, ST and OT at least three hours five days a week. Past admission physiatrist, therapy team and rehab RN have worked together to provide customized collaborative inpatient rehab. Pertaining to patient's right PCA infarction in the setting of left vertebral artery occlusion along with severe left ICA stenosis. Patient remained on aspirin and Plavix as dictated per neurology services. Lovenox for DVT prophylaxis. Vascular surgery did follow-up in regards to carotid stenosis plan was for carotid enterectomy due to patient being symptomatic 12/03/2019. Blood pressure controlled with Norvasc, lisinopril as well as Coreg. Patient to follow-up with primary MD. Lipitor for hyperlipidemia. Blood sugars monitored closely hemoglobin A1c 10.1 Glucophage as dictated full diabetic teaching. Pain management use of Lyrica 100 mg every evening with good results. She was completing a course of Macrobid for UTI. She also continued on Hytrin for both BP as well as urinary retention. No dysuria hematuria. Bouts of constipation resolved with laxative assistance and stool softener.   Blood pressures were monitored on TID basis and controlled  Diabetes has been monitored with ac/hs CBG checks and SSI was use prn for tighter BS control.    Rehab course: During patient's stay in rehab weekly team conferences were held to monitor patient's progress, set goals and discuss barriers to discharge. At admission, patient required +2 moderate assist side-lying to sitting moderate assist for rolling. Minimal assist upper body bathing minimal assist lower body bathing minimal assist lower body dressing minimal assist toilet transfers  Physical exam. Blood pressure 135/53 pulse 73 temperature 98.4 respirations 18 oxygen saturations 94% Constitutional. No acute distress HEENT Head. Normocephalic and atraumatic Eyes. Pupils round and reactive to light no discharge without nystagmus Neck. Supple nontender no JVD without  thyromegaly Cardiac regular rate rhythm without any extra sounds or murmur heard Abdomen. Soft nontender positive bowel sounds without rebound Respiratory effort normal no respiratory distress without wheeze Musculoskeletal normal range of motion no edema or tenderness Neurologic alert provides name age follows commands. Motor. Bilateral upper extremities 5/5 proximal distal Bilateral lower extremity hip flexion 4 -/5 knee extension 4/5 ankle dorsiflexion 5/5 right weaker than left. She did exhibit some left neglect  She  has had improvement in activity tolerance, balance, postural control as well as ability to compensate for deficits. Francis Dowse has had improvement in functional use RUE/LUE  and RLE/LLE as well as improvement in awareness. Supine to sit transfers with moderate assist and cues. Patient able to sit with supervision assist edge of bed. Stand pivot transfers to bedside commode with minimal assist moderate cues for sequencing. Standing balance x4 minutes with minimal assist. Perform stand pivot transfers to bed with rolling walker moderate assist to max cues for posture. Patient did need rest breaks for endurance training. Grooming task at sink side performed with set up assist. Full family teaching completed and plan was to be discharged to acute  care services under vascular surgery for left carotid endarterectomy and then discharged to home       Disposition: Discharged to acute care service for carotid surgery    Diet: N.p.o. for carotid surgery  Special Instructions: Plan aspirin and Plavix x3 months then Plavix alone  Medications at discharge dictated as per vascular surgery  30-35 minutes were spent completing discharge summary and discharge planning  Discharge Instructions    Ambulatory referral to Neurology   Complete by: As directed    An appointment is requested in approximately 4 weeks right PCA infarction in the setting of left vertebral artery occlusion   Ambulatory  referral to Physical Medicine Rehab   Complete by: As directed    Moderate complexity follow-up 1 to 2 weeks right PCA infarction       Follow-up Information    Kirsteins, Victorino Sparrow, MD Follow up.   Specialty: Physical Medicine and Rehabilitation Why: Call for appointment Contact information: [address] [place] [address] [phone]        Larina Earthly, MD Follow up.   Specialties: Vascular Surgery, Cardiology Why: Call for appointment Contact information: [address] [place] [address] [phone]               Signed: Mcarthur Rossetti Lakyra Tippins 12/03/2019, 4:45 AM

## 2019-12-02 NOTE — Plan of Care (Signed)
  Problem: RH Tub/Shower Transfers Goal: LTG Patient will perform tub/shower transfers w/assist (OT) Description: LTG: Patient will perform tub/shower transfers with assist, with/without cues using equipment (OT) Outcome: Completed/Met   

## 2019-12-02 NOTE — Anesthesia Preprocedure Evaluation (Addendum)
Anesthesia Evaluation  Patient identified by MRN, date of birth, ID band Patient awake    Reviewed: Allergy & Precautions, NPO status , Patient's Chart, lab work & pertinent test results  Airway Mallampati: III  TM Distance: >3 FB Neck ROM: Full    Dental no notable dental hx.    Pulmonary Current Smoker and Patient abstained from smoking.,    Pulmonary exam normal breath sounds clear to auscultation       Cardiovascular hypertension, Pt. on home beta blockers Normal cardiovascular exam Rhythm:Regular Rate:Normal  ECG: SR, rate 58  ECHO: 1. Left ventricular ejection fraction, by estimation, is 60 to 65%. The left ventricle has normal function. The left ventricle has no regional wall motion abnormalities. There is mild asymmetric left ventricular hypertrophy of the basal-septal segment.  Left ventricular diastolic parameters are consistent with Grade I diastolic dysfunction (impaired relaxation). Elevated left ventricular end-diastolic pressure.  2. Right ventricular systolic function is normal. The right ventricular size is normal.  3. Left atrial size was moderately dilated.  4. The mitral valve is normal in structure. Trivial mitral valve regurgitation. No evidence of mitral stenosis. Moderate mitral annular calcification.  5. The aortic valve is tricuspid. There is mild calcification of the aortic valve. There is mild thickening of the aortic valve. Aortic valve regurgitation is not visualized. Mild aortic valve sclerosis is present,  with no evidence of aortic valve stenosis.  6. The inferior vena cava is normal in size with greater than 50% respiratory variability, suggesting right atrial pressure of 3 mmHg.  7. Agitated saline contrast bubble study was negative, with no evidence of any interatrial shunt.   Neuro/Psych Blind left eye CVA, Residual Symptoms negative psych ROS   GI/Hepatic negative GI ROS, Neg liver ROS,    Endo/Other  diabetes, Insulin Dependent  Renal/GU negative Renal ROS     Musculoskeletal negative musculoskeletal ROS (+)   Abdominal   Peds  Hematology  (+) anemia , HLD   Anesthesia Other Findings LEFT CAROTID ARTERY STENOSIS  Reproductive/Obstetrics                           Anesthesia Physical Anesthesia Plan  ASA: III  Anesthesia Plan: General   Post-op Pain Management:    Induction: Intravenous  PONV Risk Score and Plan: 2 and Ondansetron, Dexamethasone and Treatment may vary due to age or medical condition  Airway Management Planned: Oral ETT  Additional Equipment: Arterial line  Intra-op Plan:   Post-operative Plan: Extubation in OR  Informed Consent: I have reviewed the patients History and Physical, chart, labs and discussed the procedure including the risks, benefits and alternatives for the proposed anesthesia with the patient or authorized representative who has indicated his/her understanding and acceptance.     Dental advisory given  Plan Discussed with: CRNA  Anesthesia Plan Comments:        Anesthesia Quick Evaluation

## 2019-12-02 NOTE — Plan of Care (Signed)
  Problem: Consults Goal: RH STROKE PATIENT EDUCATION Description: See Patient Education module for education specifics  Outcome: Progressing Goal: Nutrition Consult-if indicated Outcome: Progressing Goal: Diabetes Guidelines if Diabetic/Glucose > 140 Description: If diabetic or lab glucose is > 140 mg/dl - Initiate Diabetes/Hyperglycemia Guidelines & Document Interventions  Outcome: Progressing   Problem: RH BOWEL ELIMINATION Goal: RH STG MANAGE BOWEL WITH ASSISTANCE Description: STG Manage Bowel with mod I Assistance. Outcome: Progressing   Problem: RH BLADDER ELIMINATION Goal: RH STG MANAGE BLADDER WITH ASSISTANCE Description: STG Manage Bladder With min Assistance Outcome: Progressing   Problem: RH SKIN INTEGRITY Goal: RH STG MAINTAIN SKIN INTEGRITY WITH ASSISTANCE Description: STG Maintain Skin Integrity With mod I Assistance. Outcome: Progressing   Problem: RH SAFETY Goal: RH STG ADHERE TO SAFETY PRECAUTIONS W/ASSISTANCE/DEVICE Description: STG Adhere to Safety Precautions With mod I Assistance/Device. Outcome: Progressing   Problem: RH PAIN MANAGEMENT Goal: RH STG PAIN MANAGED AT OR BELOW PT'S PAIN GOAL Description: Pain level less than 3 on scale of 0-10 Outcome: Progressing   Problem: RH KNOWLEDGE DEFICIT Goal: RH STG INCREASE KNOWLEDGE OF DIABETES Description: Pt will be able to demonstrate understanding of medication regimen, dietary and lifestyle modifications to better control blood glucose levels using handouts and booklet provided with mod I assist.  Outcome: Progressing Goal: RH STG INCREASE KNOWLEDGE OF HYPERTENSION Description: Pt will be able to demonstrate understanding of medication regimen, dietary and lifestyle modifications to better control blood pressure and prevent stroke using handouts and booklet provided with mod I assist.  Outcome: Progressing Goal: RH STG INCREASE KNOWLEGDE OF HYPERLIPIDEMIA Description: Pt will be able to demonstrate  understanding of medication regimen, dietary and lifestyle modifications to better control cholesterol levels using handouts and booklet provided with mod I assist.  Outcome: Progressing Goal: RH STG INCREASE KNOWLEDGE OF STROKE PROPHYLAXIS Description: Pt will be able to demonstrate understanding of medication regimen, dietary and lifestyle modifications to better control blood pressure and prevent stroke using handouts and booklet provided with mod I assist.  Outcome: Progressing   

## 2019-12-02 NOTE — Progress Notes (Signed)
[facility] PHYSICAL MEDICINE & REHABILITATION PROGRESS NOTE   Subjective/Complaints:  ROS:  + nausea, no vomiting, discussed this may be S.E. of nitrofurantoin.  Appreciate [facility] note Pt denies SOB, abd pain, CP, -V/C/D, and vision changes  Objective:   No results found. Recent Labs    12/01/19 1536  WBC 11.3*  HGB 11.2*  HCT 33.0*  PLT 336   Recent Labs    11/30/19 0437 12/01/19 1536  NA  --  132*  K  --  4.2  CL  --  94*  CO2  --  28  GLUCOSE  --  182*  BUN  --  16  CREATININE 1.26* 1.32*  CALCIUM  --  8.8*    Intake/Output Summary (Last 24 hours) at 12/02/2019 1259 Last data filed at 12/02/2019 0750 Gross per 24 hour  Intake 635.4 ml  Output --  Net 635.4 ml   Physical Exam: Vital Signs Blood pressure (!) 146/58, pulse 64, temperature 98.1 F (36.7 C), temperature source Oral, resp. rate 20, height 5' 7" (1.702 m), weight 65.5 kg, SpO2 93 %.     General: No acute distress Mood and affect are appropriate Heart: Regular rate and rhythm no rubs murmurs or extra sounds Lungs: Clear to auscultation, breathing unlabored, no rales or wheezes Abdomen: Positive bowel sounds, soft nontender to palpation, nondistended Extremities: No clubbing, cyanosis, or edema Skin: No evidence of breakdown, no evidence of rash  Musculoskeletal: Full range of motion in all 4 extremities. No joint swelling   Neuro: eyes opened. Follows commands. Initiates conversation and demonstrates fair insight and awareness. Poor visual acuity/depth perception when eyes are open. 4/5 RUE, LUE, LLE, RLE  Senses LT in all 4's.     Assessment/Plan: 1. Functional deficits secondary to right PCA infarct which require 3+ hours per day of interdisciplinary therapy in a comprehensive inpatient rehab setting.  Physiatrist is providing close team supervision and 24 hour management of active medical problems listed below.  Physiatrist and rehab team continue to assess barriers to  discharge/monitor patient progress toward functional and medical goals  Care Tool:  Bathing    Body parts bathed by patient: Right arm, Left arm, Chest, Abdomen, Front perineal area, Right upper leg, Left upper leg, Face, Buttocks, Right lower leg, Left lower leg   Body parts bathed by helper: Buttocks Body parts n/a: Right lower leg, Left lower leg   Bathing assist Assist Level: Minimal Assistance - Patient > 75%     Upper Body Dressing/Undressing Upper body dressing   What is the patient wearing?: Pull over shirt    Upper body assist Assist Level: Set up assist    Lower Body Dressing/Undressing Lower body dressing      What is the patient wearing?: Incontinence brief     Lower body assist Assist for lower body dressing: Moderate Assistance - Patient 50 - 74%     Toileting Toileting    Toileting assist Assist for toileting: Moderate Assistance - Patient 50 - 74%     Transfers Chair/bed transfer  Transfers assist  Chair/bed transfer activity did not occur: Safety/medical concerns  Chair/bed transfer assist level: Minimal Assistance - Patient > 75%     Locomotion Ambulation   Ambulation assist   Ambulation activity did not occur: Safety/medical concerns  Assist level: Minimal Assistance - Patient > 75% Assistive device: Walker-rolling Max distance: 65ft   Walk 10 feet activity   Assist  Walk 10 feet activity did not occur: Safety/medical concerns  Assist level:   Minimal Assistance - Patient > 75% Assistive device: Walker-rolling   Walk 50 feet activity   Assist Walk 50 feet with 2 turns activity did not occur: Safety/medical concerns  Assist level: Minimal Assistance - Patient > 75% Assistive device: Walker-rolling    Walk 150 feet activity   Assist Walk 150 feet activity did not occur: Safety/medical concerns         Walk 10 feet on uneven surface  activity   Assist Walk 10 feet on uneven surfaces activity did not occur:  Safety/medical concerns         Wheelchair     Assist Will patient use wheelchair at discharge?: Yes Type of Wheelchair: Manual Wheelchair activity did not occur: Safety/medical concerns (pt unable to participate in [facility] assessment this date 2/2 severe nausea and vomiting and unable to transfer OOB at this time.)         Wheelchair 50 feet with 2 turns activity    Assist    Wheelchair 50 feet with 2 turns activity did not occur: Safety/medical concerns       Wheelchair 150 feet activity     Assist  Wheelchair 150 feet activity did not occur: Safety/medical concerns       Blood pressure (!) 146/58, pulse 64, temperature 98.1 F (36.7 C), temperature source Oral, resp. rate 20, height 5' 7" (1.702 m), weight 65.5 kg, SpO2 93 %.  Medical Problem List and Plan: 1.  Decreased functional mobility with falls loss of vision left peripheral field secondary to right PCA infarction in the setting of left vertebral artery occlusion along with severe left ICA stenosis with previously silent subacute left parietal infarct secondary to large vessel disease.             -patient may shower             -ELOS/Goals: d/c to [facility], 4E on 11/22  To home after post op recovery time, no need for transfer back to CIR       Continue CIR, PT, OT 2.  Antithrombotics: -DVT/anticoagulation: Lovenox             -antiplatelet therapy: Aspirin 81 mg daily and Plavix 25 mg daily x3 months then Plavix alone 3. Pain Management: Lyrica 25 mg daily, Hx post herpetic neuralgia, skin rxn to  lidocaine patch again will d/c, increase lyrica to 100 mg. Improved sleep last noc. Pain is currently well controlled- sleeping well    discussed calcific tendinopathy XR results with pt and daughte r- pt without movement related pain so xray finding are not cause of symptoms  11/13- no complaints of pain today- con't regimen 4. Mood: Provide emotional support, family supportive             -antipsychotic agents:  N/A 5. Neuropsych: This patient is capable of making decisions on her own behalf. 6. Skin/Wound Care: Routine skin checks 7. Fluids/Electrolytes/Nutrition: intake poor to inconsistent d/t nausea  -encouraged her to push po as much as she can. 8.  Hypertension.  Coreg 12.5 mg twice daily.  Monitor with increased mobility.    Vitals:   12/01/19 2016 12/02/19 0601  BP: (!) 130/56 (!) 146/58  Pulse: 63 64  Resp: 20   Temp: 98.6 F (37 C) 98.1 F (36.7 C)  SpO2: 92% 93%  BPs improving 11/21 9.  Diabetes mellitus peripheral neuropathy.  Hemoglobin A1c 10.1.  SSI.  Patient on Glucophage 500 mg twice daily prior to admission.  Resume as needed  CBG (last 3)  Recent Labs    12/01/19 2241 12/02/19 0603 12/02/19 1128  GLUCAP 129* 119* 141*  Cont metformin  well controlled 11/21 10.  Hyperlipidemia.  Lipitor 12.  Tobacco abuse.  Counseling 13.  Vestibular sx: added scopolamine patch with some improvement. Less limited Overall improved, dizziness may be to some degree related to orthostatic hypotension.  May wean scopolamine 14.  Urinary tract infx- e coli sens to nitrofurantoin  15. Constipation  Increased colace, small cont BM last noc  #16.  Overall weakness according to daughter do not see any therapy notes.  Neuro exam is unchanged.  Review of oral intake, overall poor the last few days will check be met.  Also check CBC.  . 17.  Mild leukocytosis likely due to UTI finish course of nitrofurantoin   LOS: 23 days A FACE TO FACE EVALUATION WAS PERFORMED  Charlett Blake 12/02/2019, 12:59 PM

## 2019-12-02 NOTE — Progress Notes (Addendum)
Physical Therapy Session Note  Patient Details  Name: Chelsea [name] MRN: [id number] Date of Birth: Jun 08, 1945  Today's Date: 12/02/2019 PT Individual Time: 1615-1650 PT Individual Time Calculation (min): 35 min  PT Amount of Missed Time (min): 10 Minutes PT Missed Treatment Reason: Patient ill (Comment) (nausea)  Short Term Goals: Week 3:  PT Short Term Goal 1 (Week 3): pt will ambulate 146ft with min assist and LRAD PT Short Term Goal 2 (Week 3): pt will perform bed <> chair transfer with LRAD and CGA PT Short Term Goal 3 (Week 3): pt will perfrom sit<>stand with CGA  Skilled Therapeutic Interventions/Progress Updates:    Pt received seated in bed, agreeable to PT session. No complaints of pain but reports feeling nauseous. Pt agreeable to attempt participation in therapy session. Assisted pt with donning thigh-high TEDs for BP management. Supine to sit with mod A for RLE management and trunk elevation. Assisted pt with donning abdominal binder while seated EOB. Sit to stand with CGA to RW. Stand pivot transfer to w/c with RW and CGA with increased time and cues needed as well as assist for safe RW management. Seated BP 163/55 following transfer. Pt then requests to return to bed due to nausea. Stand pivot transfer back to bed with CGA and RW. Seated BP EOB 147/65 with removal of abdominal binder. Pt returned to supine with min A for RLE management. Pt left semi-reclined in bed with needs in reach, emesis bag in place. Pt reports she has already received anti-nausea medication. Pt missed 10 min of scheduled therapy session due to nausea.  Therapy Documentation Precautions:  Precautions Precautions: Fall Restrictions Weight Bearing Restrictions: No General: PT Amount of Missed Time (min): 10 Minutes PT Missed Treatment Reason: Patient ill (Comment) (nausea)    Therapy/Group: Individual Therapy   Peter Congo, PT, DPT  12/02/2019, 5:09 PM

## 2019-12-02 NOTE — Progress Notes (Addendum)
   VASCULAR SURGERY ASSESSMENT & PLAN:   GREATER THAN 80% LEFT CAROTID STENOSIS: I agree with Dr. Bosie Helper assessment that she would benefit from left carotid endarterectomy.  I reviewed her duplex scan and CT scan and she appears to be a good candidate for left carotid endarterectomy in order to lower her risk of stroke.  I have again reviewed the indications for the procedure and the potential complications with the patient and her daughter.  I explained that there is a 1 to 2% risk of perioperative stroke.  Other risks include MI nerve injury wound healing problems and bleeding.  Given that she has remained on Plavix she is at slightly increased risk for bleeding.  All of her questions were answered and she is agreeable to proceed.  Preop orders written.  SUBJECTIVE:   She had been dehydrated and now feels much better since she has received IV fluid.  PHYSICAL EXAM:   Vitals:   12/01/19 0158 12/01/19 0507 12/01/19 2016 12/02/19 0601  BP: (!) 142/60 (!) 159/57 (!) 130/56 (!) 146/58  Pulse: 68 69 63 64  Resp:  20 20   Temp: 99 F (37.2 C) 98.2 F (36.8 C) 98.6 F (37 C) 98.1 F (36.7 C)  TempSrc: Oral Oral Oral Oral  SpO2:  93% 92% 93%  Weight:      Height:       NEURO: No focal weakness or paresthesias.  LABS:   Lab Results  Component Value Date   WBC 11.3 (H) 12/01/2019   HGB 11.2 (L) 12/01/2019   HCT 33.0 (L) 12/01/2019   MCV 93.0 12/01/2019   PLT 336 12/01/2019   Lab Results  Component Value Date   CREATININE 1.32 (H) 12/01/2019   Lab Results  Component Value Date   INR 1.0 11/08/2019   CBG (last 3)  Recent Labs    12/01/19 1623 12/01/19 2241 12/02/19 0603  GLUCAP 180* 129* 119*    PROBLEM LIST:    Active Problems:   Acute ischemic right posterior cerebral artery (PCA) stroke (HCC)   Pain   Stroke (HCC)   CURRENT MEDS:   . amLODipine  10 mg Oral Daily  . aspirin EC  81 mg Oral Daily  . atorvastatin  80 mg Oral Daily  . carvedilol  3.125 mg  Oral Daily  . clopidogrel  75 mg Oral Daily  . enoxaparin (LOVENOX) injection  40 mg Subcutaneous Daily  . hydroxypropyl methylcellulose / hypromellose  1 drop Left Eye TID  . insulin aspart  0-15 Units Subcutaneous TID AC & HS  . lisinopril  2.5 mg Oral Daily  . metFORMIN  500 mg Oral BID WC  . multivitamin with minerals  1 tablet Oral Daily  . nitrofurantoin (macrocrystal-monohydrate)  100 mg Oral Q12H  . ondansetron  4 mg Oral Q12H   Or  . ondansetron (ZOFRAN) IV  4 mg Intravenous Q12H  . polyethylene glycol  17 g Oral Daily  . pregabalin  100 mg Oral QPM  . Ensure Max Protein  11 oz Oral Daily  . scopolamine  1 patch Transdermal Q72H  . senna-docusate  2 tablet Oral BID  . terazosin  2 mg Oral QHS    Waverly Ferrari Office: [phone] 12/02/2019

## 2019-12-03 ENCOUNTER — Inpatient Hospital Stay (HOSPITAL_COMMUNITY)
Admission: RE | Admit: 2019-12-03 | Discharge: 2019-12-05 | DRG: 039 | Disposition: A | Discharge status: Home Health | Payer: PPO | Attending: Vascular Surgery | Admitting: Vascular Surgery

## 2019-12-03 ENCOUNTER — Encounter (HOSPITAL_COMMUNITY)
Admission: RE | Disposition: A | Discharge status: Home Health | Payer: Self-pay | Source: Home / Self Care | Attending: Vascular Surgery

## 2019-12-03 ENCOUNTER — Inpatient Hospital Stay (HOSPITAL_COMMUNITY): Payer: PPO | Admitting: Anesthesiology

## 2019-12-03 DIAGNOSIS — I10 Essential (primary) hypertension: Secondary | ICD-10-CM | POA: Diagnosis present

## 2019-12-03 DIAGNOSIS — H5462 Unqualified visual loss, left eye, normal vision right eye: Secondary | ICD-10-CM | POA: Diagnosis present

## 2019-12-03 DIAGNOSIS — R296 Repeated falls: Secondary | ICD-10-CM | POA: Diagnosis not present

## 2019-12-03 DIAGNOSIS — Z20822 Contact with and (suspected) exposure to covid-19: Secondary | ICD-10-CM | POA: Diagnosis present

## 2019-12-03 DIAGNOSIS — Z8673 Personal history of transient ischemic attack (TIA), and cerebral infarction without residual deficits: Secondary | ICD-10-CM

## 2019-12-03 DIAGNOSIS — E119 Type 2 diabetes mellitus without complications: Secondary | ICD-10-CM | POA: Diagnosis not present

## 2019-12-03 DIAGNOSIS — Z794 Long term (current) use of insulin: Secondary | ICD-10-CM | POA: Diagnosis not present

## 2019-12-03 DIAGNOSIS — I63232 Cerebral infarction due to unspecified occlusion or stenosis of left carotid arteries: Secondary | ICD-10-CM | POA: Diagnosis not present

## 2019-12-03 DIAGNOSIS — E1165 Type 2 diabetes mellitus with hyperglycemia: Secondary | ICD-10-CM | POA: Diagnosis not present

## 2019-12-03 DIAGNOSIS — Z7982 Long term (current) use of aspirin: Secondary | ICD-10-CM | POA: Diagnosis not present

## 2019-12-03 DIAGNOSIS — E785 Hyperlipidemia, unspecified: Secondary | ICD-10-CM | POA: Diagnosis present

## 2019-12-03 DIAGNOSIS — I63531 Cerebral infarction due to unspecified occlusion or stenosis of right posterior cerebral artery: Secondary | ICD-10-CM | POA: Diagnosis not present

## 2019-12-03 DIAGNOSIS — I6522 Occlusion and stenosis of left carotid artery: Secondary | ICD-10-CM | POA: Diagnosis present

## 2019-12-03 HISTORY — PX: ENDARTERECTOMY: SHX5162

## 2019-12-03 HISTORY — PX: PATCH ANGIOPLASTY: SHX6230

## 2019-12-03 LAB — GLUCOSE, CAPILLARY
Glucose-Capillary: 124 mg/dL — ABNORMAL HIGH (ref 70–99)
Glucose-Capillary: 147 mg/dL — ABNORMAL HIGH (ref 70–99)
Glucose-Capillary: 238 mg/dL — ABNORMAL HIGH (ref 70–99)

## 2019-12-03 LAB — POCT ACTIVATED CLOTTING TIME: Activated Clotting Time: 268 seconds

## 2019-12-03 SURGERY — ENDARTERECTOMY, CAROTID
Anesthesia: General | Laterality: Left

## 2019-12-03 MED ORDER — TERAZOSIN HCL 2 MG PO CAPS: 2.0000 mg | ORAL_CAPSULE | Freq: Every day | ORAL | Status: DC

## 2019-12-03 MED ORDER — ONDANSETRON HCL 4 MG/2ML IJ SOLN: 4.0000 mg | Freq: Once | INTRAMUSCULAR | Status: DC | PRN

## 2019-12-03 MED ORDER — PHENOL 1.4 % MT LIQD: 1.0000 | OROMUCOSAL | Status: DC | PRN

## 2019-12-03 MED ORDER — CARVEDILOL 3.125 MG PO TABS: 3.1250 mg | ORAL_TABLET | Freq: Every day | ORAL | Status: DC

## 2019-12-03 MED ORDER — VANCOMYCIN HCL IN DEXTROSE 1-5 GM/200ML-% IV SOLN
1000.0000 mg | Freq: Two times a day (BID) | INTRAVENOUS | Status: AC
  Administered 2019-12-03 – 2019-12-04 (×2): 1000 mg via INTRAVENOUS
  Filled 2019-12-03 (×2): qty 200

## 2019-12-03 MED ORDER — MAGNESIUM SULFATE 2 GM/50ML IV SOLN: 2.0000 g | Freq: Every day | INTRAVENOUS | Status: DC | PRN

## 2019-12-03 MED ORDER — ACETAMINOPHEN 325 MG PO TABS
325.0000 mg | ORAL_TABLET | ORAL | Status: DC | PRN
  Administered 2019-12-04 – 2019-12-05 (×3): 650 mg via ORAL
  Filled 2019-12-03 (×3): qty 2

## 2019-12-03 MED ORDER — BISACODYL 10 MG RE SUPP: 10.0000 mg | Freq: Every day | RECTAL | Status: DC | PRN

## 2019-12-03 MED ORDER — ARTIFICIAL TEARS OPHTHALMIC OINT
TOPICAL_OINTMENT | Freq: Every evening | OPHTHALMIC | Status: DC | PRN
  Filled 2019-12-03: qty 3.5

## 2019-12-03 MED ORDER — GUAIFENESIN-DM 100-10 MG/5ML PO SYRP: 15.0000 mL | ORAL_SOLUTION | ORAL | Status: DC | PRN

## 2019-12-03 MED ORDER — SUGAMMADEX SODIUM 200 MG/2ML IV SOLN
INTRAVENOUS | Status: DC | PRN
  Administered 2019-12-03: 200 mg via INTRAVENOUS

## 2019-12-03 MED ORDER — HEPARIN SODIUM (PORCINE) 1000 UNIT/ML IJ SOLN
INTRAMUSCULAR | Status: DC | PRN
  Administered 2019-12-03: 7000 [IU] via INTRAVENOUS

## 2019-12-03 MED ORDER — ACETAMINOPHEN 650 MG RE SUPP: 325.0000 mg | RECTAL | Status: DC | PRN

## 2019-12-03 MED ORDER — LIDOCAINE-EPINEPHRINE (PF) 1 %-1:200000 IJ SOLN
INTRAMUSCULAR | Status: AC
  Filled 2019-12-03: qty 30

## 2019-12-03 MED ORDER — PROPOFOL 10 MG/ML IV BOLUS
INTRAVENOUS | Status: AC
  Filled 2019-12-03: qty 20

## 2019-12-03 MED ORDER — LIDOCAINE HCL (PF) 2 % IJ SOLN
INTRAMUSCULAR | Status: AC
  Filled 2019-12-03: qty 5

## 2019-12-03 MED ORDER — ROCURONIUM BROMIDE 10 MG/ML (PF) SYRINGE
PREFILLED_SYRINGE | INTRAVENOUS | Status: DC | PRN
  Administered 2019-12-03: 40 mg via INTRAVENOUS

## 2019-12-03 MED ORDER — ASPIRIN EC 81 MG PO TBEC
81.0000 mg | DELAYED_RELEASE_TABLET | Freq: Every day | ORAL | Status: DC
  Administered 2019-12-04 – 2019-12-05 (×2): 81 mg via ORAL
  Filled 2019-12-03 (×2): qty 1

## 2019-12-03 MED ORDER — ALUM & MAG HYDROXIDE-SIMETH 200-200-20 MG/5ML PO SUSP: 15.0000 mL | ORAL | Status: DC | PRN

## 2019-12-03 MED ORDER — LIDOCAINE HCL (PF) 1 % IJ SOLN
INTRAMUSCULAR | Status: AC
  Filled 2019-12-03: qty 5

## 2019-12-03 MED ORDER — CARVEDILOL 3.125 MG PO TABS
3.1250 mg | ORAL_TABLET | Freq: Every day | ORAL | Status: DC
  Administered 2019-12-03 – 2019-12-05 (×3): 3.125 mg via ORAL
  Filled 2019-12-03 (×3): qty 1

## 2019-12-03 MED ORDER — SUCCINYLCHOLINE CHLORIDE 200 MG/10ML IV SOSY
PREFILLED_SYRINGE | INTRAVENOUS | Status: AC
  Filled 2019-12-03: qty 10

## 2019-12-03 MED ORDER — FENTANYL CITRATE (PF) 250 MCG/5ML IJ SOLN
INTRAMUSCULAR | Status: AC
  Filled 2019-12-03: qty 5

## 2019-12-03 MED ORDER — ONDANSETRON HCL 4 MG/2ML IJ SOLN
INTRAMUSCULAR | Status: DC | PRN
  Administered 2019-12-03: 4 mg via INTRAVENOUS

## 2019-12-03 MED ORDER — PANTOPRAZOLE SODIUM 40 MG PO TBEC
40.0000 mg | DELAYED_RELEASE_TABLET | Freq: Every day | ORAL | Status: DC
  Administered 2019-12-04 – 2019-12-05 (×2): 40 mg via ORAL
  Filled 2019-12-03 (×2): qty 1

## 2019-12-03 MED ORDER — SCOPOLAMINE 1 MG/3DAYS TD PT72
1.0000 | MEDICATED_PATCH | TRANSDERMAL | Status: DC
  Administered 2019-12-04: 1.5 mg via TRANSDERMAL
  Filled 2019-12-03: qty 1

## 2019-12-03 MED ORDER — HYDRALAZINE HCL 20 MG/ML IJ SOLN: 5.0000 mg | INTRAMUSCULAR | Status: DC | PRN

## 2019-12-03 MED ORDER — DEXAMETHASONE SODIUM PHOSPHATE 10 MG/ML IJ SOLN
INTRAMUSCULAR | Status: DC | PRN
  Administered 2019-12-03: 10 mg via INTRAVENOUS

## 2019-12-03 MED ORDER — ARTIFICIAL TEARS OPHTHALMIC OINT: TOPICAL_OINTMENT | Freq: Every evening | OPHTHALMIC | Status: DC | PRN

## 2019-12-03 MED ORDER — METOPROLOL TARTRATE 5 MG/5ML IV SOLN: 2.0000 mg | INTRAVENOUS | Status: DC | PRN

## 2019-12-03 MED ORDER — ACETAMINOPHEN 10 MG/ML IV SOLN: 1000.0000 mg | Freq: Once | INTRAVENOUS | Status: DC | PRN

## 2019-12-03 MED ORDER — EPHEDRINE SULFATE-NACL 50-0.9 MG/10ML-% IV SOSY
PREFILLED_SYRINGE | INTRAVENOUS | Status: DC | PRN
  Administered 2019-12-03: 10 mg via INTRAVENOUS
  Administered 2019-12-03: 5 mg via INTRAVENOUS

## 2019-12-03 MED ORDER — ACETAMINOPHEN 325 MG PO TABS: 650.0000 mg | ORAL_TABLET | Freq: Four times a day (QID) | ORAL | Status: DC | PRN

## 2019-12-03 MED ORDER — VANCOMYCIN HCL 1250 MG/250ML IV SOLN: 1250.0000 mg | INTRAVENOUS | 0 refills | Status: DC

## 2019-12-03 MED ORDER — ONDANSETRON HCL 4 MG/2ML IJ SOLN: 4.0000 mg | Freq: Four times a day (QID) | INTRAMUSCULAR | Status: DC | PRN

## 2019-12-03 MED ORDER — FENTANYL CITRATE (PF) 100 MCG/2ML IJ SOLN: 25.0000 ug | INTRAMUSCULAR | Status: DC | PRN

## 2019-12-03 MED ORDER — SODIUM CHLORIDE 0.9 % IV SOLN
INTRAVENOUS | Status: AC
  Filled 2019-12-03: qty 1.2

## 2019-12-03 MED ORDER — VANCOMYCIN HCL 1000 MG IV SOLR
INTRAVENOUS | Status: DC | PRN
  Administered 2019-12-03: 1250 mg via INTRAVENOUS

## 2019-12-03 MED ORDER — POLYETHYLENE GLYCOL 3350 17 G PO PACK: 17.0000 g | PACK | Freq: Every day | ORAL | Status: DC | PRN

## 2019-12-03 MED ORDER — INSULIN ASPART 100 UNIT/ML ~~LOC~~ SOLN
0.0000 [IU] | Freq: Three times a day (TID) | SUBCUTANEOUS | Status: DC
  Administered 2019-12-03: 3 [IU] via SUBCUTANEOUS
  Administered 2019-12-04 (×3): 2 [IU] via SUBCUTANEOUS
  Administered 2019-12-05: 5 [IU] via SUBCUTANEOUS

## 2019-12-03 MED ORDER — PHENYLEPHRINE 40 MCG/ML (10ML) SYRINGE FOR IV PUSH (FOR BLOOD PRESSURE SUPPORT)
PREFILLED_SYRINGE | INTRAVENOUS | Status: AC
  Filled 2019-12-03: qty 10

## 2019-12-03 MED ORDER — DEXTRAN 40 IN D5W 10 % IV SOLN
INTRAVENOUS | Status: AC | PRN
  Administered 2019-12-03: 500 mL

## 2019-12-03 MED ORDER — HYPROMELLOSE (GONIOSCOPIC) 2.5 % OP SOLN: 1.0000 [drp] | Freq: Three times a day (TID) | OPHTHALMIC | 12 refills | Status: DC

## 2019-12-03 MED ORDER — PREGABALIN 100 MG PO CAPS
100.0000 mg | ORAL_CAPSULE | Freq: Every evening | ORAL | Status: DC
  Administered 2019-12-03 – 2019-12-05 (×3): 100 mg via ORAL
  Filled 2019-12-03 (×3): qty 1

## 2019-12-03 MED ORDER — LISINOPRIL 2.5 MG PO TABS
2.5000 mg | ORAL_TABLET | Freq: Every day | ORAL | Status: DC
  Administered 2019-12-03 – 2019-12-05 (×3): 2.5 mg via ORAL
  Filled 2019-12-03 (×3): qty 1

## 2019-12-03 MED ORDER — POLYETHYLENE GLYCOL 3350 17 G PO PACK
17.0000 g | PACK | Freq: Every day | ORAL | Status: DC
  Administered 2019-12-04: 17 g via ORAL
  Filled 2019-12-03 (×2): qty 1

## 2019-12-03 MED ORDER — SODIUM CHLORIDE 0.9 % IV SOLN: 500.0000 mL | Freq: Once | INTRAVENOUS | Status: DC | PRN

## 2019-12-03 MED ORDER — SODIUM CHLORIDE 0.9 % IV SOLN: INTRAVENOUS | Status: DC

## 2019-12-03 MED ORDER — ATORVASTATIN CALCIUM 80 MG PO TABS
80.0000 mg | ORAL_TABLET | Freq: Every day | ORAL | Status: DC
  Administered 2019-12-03 – 2019-12-05 (×3): 80 mg via ORAL
  Filled 2019-12-03 (×3): qty 1

## 2019-12-03 MED ORDER — PHENYLEPHRINE HCL-NACL 10-0.9 MG/250ML-% IV SOLN
INTRAVENOUS | Status: DC | PRN
  Administered 2019-12-03: 30 ug/min via INTRAVENOUS

## 2019-12-03 MED ORDER — DEXAMETHASONE SODIUM PHOSPHATE 10 MG/ML IJ SOLN
INTRAMUSCULAR | Status: AC
  Filled 2019-12-03: qty 1

## 2019-12-03 MED ORDER — PROPOFOL 10 MG/ML IV BOLUS
INTRAVENOUS | Status: DC | PRN
Start: 2019-12-03 — End: 2019-12-03
  Administered 2019-12-03: 200 mg via INTRAVENOUS

## 2019-12-03 MED ORDER — 0.9 % SODIUM CHLORIDE (POUR BTL) OPTIME
TOPICAL | Status: DC | PRN
  Administered 2019-12-03 (×2): 1000 mL

## 2019-12-03 MED ORDER — HYPROMELLOSE (GONIOSCOPIC) 2.5 % OP SOLN
1.0000 [drp] | Freq: Three times a day (TID) | OPHTHALMIC | Status: DC
  Administered 2019-12-03 – 2019-12-05 (×6): 1 [drp] via OPHTHALMIC
  Filled 2019-12-03: qty 15

## 2019-12-03 MED ORDER — SODIUM CHLORIDE 0.9 % IV SOLN: INTRAVENOUS | Status: DC | PRN

## 2019-12-03 MED ORDER — LACTATED RINGERS IV SOLN: INTRAVENOUS | Status: DC | PRN

## 2019-12-03 MED ORDER — EPHEDRINE 5 MG/ML INJ
INTRAVENOUS | Status: AC
  Filled 2019-12-03: qty 10

## 2019-12-03 MED ORDER — ONDANSETRON HCL 4 MG/2ML IJ SOLN
INTRAMUSCULAR | Status: AC
  Filled 2019-12-03: qty 2

## 2019-12-03 MED ORDER — POTASSIUM CHLORIDE CRYS ER 20 MEQ PO TBCR: 20.0000 meq | EXTENDED_RELEASE_TABLET | Freq: Every day | ORAL | Status: DC | PRN

## 2019-12-03 MED ORDER — POLYETHYLENE GLYCOL 3350 17 G PO PACK: 17.0000 g | PACK | Freq: Every day | ORAL | 0 refills | Status: DC

## 2019-12-03 MED ORDER — ROCURONIUM BROMIDE 10 MG/ML (PF) SYRINGE
PREFILLED_SYRINGE | INTRAVENOUS | Status: AC
  Filled 2019-12-03: qty 10

## 2019-12-03 MED ORDER — LISINOPRIL 2.5 MG PO TABS: 2.5000 mg | ORAL_TABLET | Freq: Every day | ORAL | Status: DC

## 2019-12-03 MED ORDER — LIDOCAINE 2% (20 MG/ML) 5 ML SYRINGE
INTRAMUSCULAR | Status: DC | PRN
  Administered 2019-12-03: 60 mg via INTRAVENOUS

## 2019-12-03 MED ORDER — DOCUSATE SODIUM 100 MG PO CAPS
100.0000 mg | ORAL_CAPSULE | Freq: Every day | ORAL | Status: DC
  Administered 2019-12-04 – 2019-12-05 (×2): 100 mg via ORAL
  Filled 2019-12-03 (×2): qty 1

## 2019-12-03 MED ORDER — PREGABALIN 100 MG PO CAPS: 100.0000 mg | ORAL_CAPSULE | Freq: Every evening | ORAL | Status: DC

## 2019-12-03 MED ORDER — TERAZOSIN HCL 1 MG PO CAPS
2.0000 mg | ORAL_CAPSULE | Freq: Every day | ORAL | Status: DC
  Administered 2019-12-03 – 2019-12-05 (×3): 2 mg via ORAL
  Filled 2019-12-03 (×2): qty 2
  Filled 2019-12-03 (×2): qty 1

## 2019-12-03 MED ORDER — CLOPIDOGREL BISULFATE 75 MG PO TABS
75.0000 mg | ORAL_TABLET | Freq: Every day | ORAL | Status: DC
  Administered 2019-12-04 – 2019-12-05 (×2): 75 mg via ORAL
  Filled 2019-12-03 (×2): qty 1

## 2019-12-03 MED ORDER — LABETALOL HCL 5 MG/ML IV SOLN: 10.0000 mg | INTRAVENOUS | Status: DC | PRN

## 2019-12-03 MED ORDER — SODIUM CHLORIDE 0.9 % IV SOLN
0.0125 ug/kg/min | INTRAVENOUS | Status: AC
  Administered 2019-12-03: .1 ug/kg/min via INTRAVENOUS
  Filled 2019-12-03: qty 2000

## 2019-12-03 MED ORDER — THROMBIN (RECOMBINANT) 20000 UNITS EX SOLR
CUTANEOUS | Status: AC
  Filled 2019-12-03: qty 20000

## 2019-12-03 MED ORDER — AMLODIPINE BESYLATE 10 MG PO TABS
10.0000 mg | ORAL_TABLET | Freq: Every day | ORAL | Status: DC
  Administered 2019-12-03 – 2019-12-05 (×3): 10 mg via ORAL
  Filled 2019-12-03 (×3): qty 1

## 2019-12-03 MED ORDER — FENTANYL CITRATE (PF) 250 MCG/5ML IJ SOLN
INTRAMUSCULAR | Status: DC | PRN
  Administered 2019-12-03: 50 ug via INTRAVENOUS

## 2019-12-03 MED ORDER — METFORMIN HCL 500 MG PO TABS: 500.0000 mg | ORAL_TABLET | Freq: Two times a day (BID) | ORAL | Status: DC

## 2019-12-03 MED ORDER — ASCORBIC ACID 500 MG PO TABS
500.0000 mg | ORAL_TABLET | Freq: Every day | ORAL | Status: DC
  Administered 2019-12-04 – 2019-12-05 (×2): 500 mg via ORAL
  Filled 2019-12-03 (×2): qty 1

## 2019-12-03 MED ORDER — SCOPOLAMINE 1 MG/3DAYS TD PT72: 1.0000 | MEDICATED_PATCH | TRANSDERMAL | 12 refills | Status: DC

## 2019-12-03 MED ORDER — PHENYLEPHRINE HCL (PRESSORS) 10 MG/ML IV SOLN
INTRAVENOUS | Status: DC | PRN
  Administered 2019-12-03: 120 ug via INTRAVENOUS

## 2019-12-03 MED ORDER — AMLODIPINE BESYLATE 10 MG PO TABS: 10.0000 mg | ORAL_TABLET | Freq: Every day | ORAL | Status: DC

## 2019-12-03 MED ORDER — ACETAMINOPHEN 10 MG/ML IV SOLN
INTRAVENOUS | Status: AC
  Administered 2019-12-03: 1000 mg via INTRAVENOUS
  Filled 2019-12-03: qty 100

## 2019-12-03 SURGICAL SUPPLY — 43 items
ADH SKN CLS APL DERMABOND .7 (GAUZE/BANDAGES/DRESSINGS) ×1
BAG DECANTER FOR FLEXI CONT (MISCELLANEOUS) ×3 IMPLANT
CANISTER SUCT 3000ML PPV (MISCELLANEOUS) ×3 IMPLANT
CANNULA VESSEL 3MM 2 BLNT TIP (CANNULA) ×9 IMPLANT
CATH ROBINSON RED A/P 18FR (CATHETERS) ×5 IMPLANT
CLIP VESOCCLUDE MED 24/CT (CLIP) ×3 IMPLANT
CLIP VESOCCLUDE SM WIDE 24/CT (CLIP) ×3 IMPLANT
COVER WAND RF STERILE (DRAPES) ×3 IMPLANT
DERMABOND ADVANCED (GAUZE/BANDAGES/DRESSINGS) ×2
DERMABOND ADVANCED .7 DNX12 (GAUZE/BANDAGES/DRESSINGS) ×1 IMPLANT
DRAIN CHANNEL 15F RND FF W/TCR (WOUND CARE) IMPLANT
ELECT REM PT RETURN 9FT ADLT (ELECTROSURGICAL) ×3
ELECTRODE REM PT RTRN 9FT ADLT (ELECTROSURGICAL) ×1 IMPLANT
EVACUATOR SILICONE 100CC (DRAIN) IMPLANT
GLOVE BIO SURGEON STRL SZ7.5 (GLOVE) ×3 IMPLANT
GLOVE BIOGEL PI IND STRL 8 (GLOVE) ×1 IMPLANT
GLOVE BIOGEL PI INDICATOR 8 (GLOVE) ×2
GOWN STRL REUS W/ TWL LRG LVL3 (GOWN DISPOSABLE) ×3 IMPLANT
GOWN STRL REUS W/TWL LRG LVL3 (GOWN DISPOSABLE) ×12
KIT BASIN OR (CUSTOM PROCEDURE TRAY) ×3 IMPLANT
KIT SHUNT ARGYLE CAROTID ART 6 (VASCULAR PRODUCTS) IMPLANT
KIT TURNOVER KIT B (KITS) ×3 IMPLANT
NDL HYPO 25GX1X1/2 BEV (NEEDLE) ×1 IMPLANT
NEEDLE HYPO 25GX1X1/2 BEV (NEEDLE) ×3 IMPLANT
NS IRRIG 1000ML POUR BTL (IV SOLUTION) ×6 IMPLANT
PACK CAROTID (CUSTOM PROCEDURE TRAY) ×3 IMPLANT
PAD ARMBOARD 7.5X6 YLW CONV (MISCELLANEOUS) ×6 IMPLANT
PATCH VASC XENOSURE 1CMX6CM (Vascular Products) ×3 IMPLANT
PATCH VASC XENOSURE 1X6 (Vascular Products) IMPLANT
POSITIONER HEAD DONUT 9IN (MISCELLANEOUS) ×3 IMPLANT
SET WALTER ACTIVATION W/DRAPE (SET/KITS/TRAYS/PACK) IMPLANT
SHUNT CAROTID BYPASS 10 (VASCULAR PRODUCTS) ×2 IMPLANT
SHUNT CAROTID BYPASS 12 (VASCULAR PRODUCTS) IMPLANT
SPONGE SURGIFOAM ABS GEL 100 (HEMOSTASIS) IMPLANT
SUT PROLENE 6 0 BV (SUTURE) ×6 IMPLANT
SUT SILK 2 0 PERMA HAND 18 BK (SUTURE) IMPLANT
SUT VIC AB 3-0 SH 27 (SUTURE) ×3
SUT VIC AB 3-0 SH 27X BRD (SUTURE) ×1 IMPLANT
SUT VICRYL 4-0 PS2 18IN ABS (SUTURE) ×3 IMPLANT
SYR 20ML LL LF (SYRINGE) ×5 IMPLANT
SYR CONTROL 10ML LL (SYRINGE) ×3 IMPLANT
TOWEL GREEN STERILE (TOWEL DISPOSABLE) ×3 IMPLANT
WATER STERILE IRR 1000ML POUR (IV SOLUTION) ×3 IMPLANT

## 2019-12-03 NOTE — Progress Notes (Signed)
SW has informed daughter of current denials of home health refferal. Will continue to attempt to get patient set up. Daughter inquiring about wheelchair and rolling walker. SW following up with PT.,  Lavera Guise, BSW [phone]

## 2019-12-03 NOTE — Interval H&P Note (Signed)
History and Physical Interval Note:  12/03/2019 7:08 AM  Chelsea [name]  has presented today for surgery, with the diagnosis of LEFT CAROTID ARTERY STENOSIS.  The various methods of treatment have been discussed with the patient and family. After consideration of risks, benefits and other options for treatment, the patient has consented to  Procedure(s): LEFT CAROTID ENDARTERECTOMY (Left) as a surgical intervention.  The patient's history has been reviewed, patient examined, no change in status, stable for surgery.  I have reviewed the patient's chart and labs.  Questions were answered to the patient's satisfaction.     Chelsea [name]

## 2019-12-03 NOTE — Anesthesia Procedure Notes (Signed)
Arterial Line Insertion Start/End11/22/2021 7:00 AM Performed by: Leonides Grills, MD, CRNA  Patient location: Pre-op. Preanesthetic checklist: patient identified, IV checked, site marked, risks and benefits discussed, surgical consent, monitors and equipment checked, pre-op evaluation, timeout performed and anesthesia consent Lidocaine 1% used for infiltration radial was placed Catheter size: 20 Fr Hand hygiene performed  and maximum sterile barriers used   Attempts: 1 Procedure performed without using ultrasound guided technique. Following insertion, dressing applied. Post procedure assessment: normal and unchanged  Patient tolerated the procedure well with no immediate complications. Additional procedure comments: Performed by Prescott Gum, SRNA.

## 2019-12-03 NOTE — Anesthesia Postprocedure Evaluation (Signed)
Anesthesia Post Note  Patient: Chelsea [name]  Procedure(s) Performed: LEFT CAROTID ENDARTERECTOMY (Left ) PATCH ANGIOPLASTY Left Carotid Artery (Left )     Patient location during evaluation: PACU Anesthesia Type: General Level of consciousness: awake Pain management: pain level controlled Vital Signs Assessment: post-procedure vital signs reviewed and stable Respiratory status: spontaneous breathing, nonlabored ventilation, respiratory function stable and patient connected to nasal cannula oxygen Cardiovascular status: blood pressure returned to baseline and stable Postop Assessment: no apparent nausea or vomiting Anesthetic complications: no   No complications documented.  Last Vitals:  Vitals:   12/03/19 1130 12/03/19 1153  BP: (!) 136/58 (!) 150/64  Pulse: 64 66  Resp: 17 18  Temp:  36.8 C  SpO2: 91% 94%    Last Pain:  Vitals:   12/03/19 1153  TempSrc: Oral  PainSc:                  Elazar Argabright P Bliss Tsang

## 2019-12-03 NOTE — Anesthesia Procedure Notes (Addendum)
Procedure Name: Intubation Date/Time: 12/03/2019 7:48 AM Performed by: Rosiland Oz, CRNA Pre-anesthesia Checklist: Patient identified, Emergency Drugs available, Suction available and Patient being monitored Patient Re-evaluated:Patient Re-evaluated prior to induction Oxygen Delivery Method: Circle System Utilized Preoxygenation: Pre-oxygenation with 100% oxygen Induction Type: IV induction Ventilation: Mask ventilation without difficulty Laryngoscope Size: Miller and 2 Grade View: Grade I Tube type: Oral Tube size: 7.0 mm Number of attempts: 1 Airway Equipment and Method: Stylet and Oral airway Placement Confirmation: ETT inserted through vocal cords under direct vision,  positive ETCO2 and breath sounds checked- equal and bilateral Secured at: 22 (lip) cm Tube secured with: Tape Dental Injury: Teeth and Oropharynx as per pre-operative assessment  Comments: Performed by Prescott Gum, SRNA

## 2019-12-03 NOTE — Progress Notes (Signed)
   VASCULAR SURGERY POSTOP:   Doing well postop.  We will consult CIR tomorrow to see if she can go home or needs to go back to CIR for more therapy.  VASCULAR QUALITY INITIATIVE: She is on aspirin and is on a statin.  SUBJECTIVE:   Pain well controlled.  PHYSICAL EXAM:   Vitals:   12/03/19 1015 12/03/19 1030 12/03/19 1045 12/03/19 1100  BP: (!) 150/58 (!) 154/60 (!) 141/72 (!) 136/55  Pulse: 71 69 68 66  Resp: 15 14 17 16   Temp:      SpO2: 92% 92% 90% 91%   NEURO: No focal weakness or paresthesias. Incision looks fine. Tongue midline.  LABS:   CBG (last 3)  Recent Labs    12/02/19 2045 12/03/19 0539 12/03/19 0959  GLUCAP 131* 124* 147*    PROBLEM LIST:    Active Problems:   Left carotid artery stenosis   CURRENT MEDS:     12/05/19 Office: Waverly Ferrari 12/03/2019

## 2019-12-03 NOTE — Progress Notes (Signed)
Inpatient Rehabilitation-Admissions Coordinator   Noted CIR consult placed. Per TOC team in IP Rehab, the plan is for pt to return home with [facility] services post op. AC will DC consult order at this time.   Cheri Rous, OTR/L  Rehab Admissions Coordinator  [phone] 12/03/2019 1:33 PM

## 2019-12-03 NOTE — Progress Notes (Signed)
Patient declined by [facility] due to insurance   [place], [place]

## 2019-12-03 NOTE — OR Nursing (Signed)
Pt is awake,alert and oriented.Pt and/or family verbalized understanding of poc and discharge instructions. Reviewed admission and on going care with receiving RN. Pt is in NAD at this time and is ready to be transferred to floor. Will con't to monitor until pt is transferred. Monitor in place to floor with RN and TEch

## 2019-12-03 NOTE — Progress Notes (Signed)
Patient declined by [facility] (family preferred) due to insurance and staffing.  Chelsea [name], BSW

## 2019-12-03 NOTE — Op Note (Signed)
    NAME: Chelsea [name]    MRN: [id number] DOB: 1945/05/18    DATE OF OPERATION: 12/03/2019  PREOP DIAGNOSIS:    Greater than 80% left carotid stenosis  POSTOP DIAGNOSIS:    Same  PROCEDURE:    Left carotid endarterectomy with bovine pericardial patch angioplasty  SURGEON: Di Kindle. Edilia Bo, MD  ASSIST: Darlin Coco, PA  ANESTHESIA: General  EBL: Minimal  INDICATIONS:    Chelsea [name] is a 74 y.o. female who was seen in consultation by Dr. Arbie Cookey with a greater than 80% left carotid stenosis and no significant stenosis on the right.  The patient had presented with a posterior circulation stroke thus the stenosis was not felt to be symptomatic.  However given the severity of the stenosis left carotid endarterectomy was recommended.  FINDINGS:   90% left carotid stenosis.  TECHNIQUE:   The patient was taken to the operating room and received a general anesthetic.  Arterial line had been placed by anesthesia.  The left neck was prepped and draped in the usual sterile fashion.  A longitudinal incision was made along the anterior border the sternocleidomastoid and the dissection carried down to the common carotid artery.  The patient was heparinized.  The common carotid artery was controlled with Rummel tourniquet.  The facial vein was divided between 2-0 silk ties.  The internal carotid artery was controlled with a red vessel loop and the superior thyroid artery controlled with a 2-0 silk tie.  The external carotid artery was controlled with a blue vessel loop.  Clamps were then placed on the internal and the common then the external carotid artery.  A longitudinal arteriotomy was made in the common carotid artery and this was extended through the plaque into the internal carotid artery.  A 10 shunt was placed into the internal carotid artery backbled and then placed into the common carotid artery and secured with Rummel tourniquet.  Flow was reestablished to the shunt.  Flow in  the shunt was checked with the Doppler.  An endarterectomy plane was established proximally and the plaque was sharply divided.  Eversion endarterectomy was performed of the external carotid artery.  Distally there was a nice tapering the plaque and no tacking sutures were required.  The artery was irrigated with copious amounts of heparin and dextran and all loose debris removed.  A bovine pericardial patch was then sewn using continuous 6-0 Prolene suture.  Prior to completing the patch closure the shunt was removed.  The arteries were backbled and flushed appropriately.  The anastomosis was completed.  Flow was reestablished first to the external carotid artery and into the internal carotid artery.  At the completion was a good Doppler signal distal to the patch and a good pulse.  Hemostasis was obtained in the wound.  The heparin was partially reversed with protamine.  The deep layer was closed with a deep layer of 3-0 Vicryl, the platysma was closed with a running 3-0 Vicryl, and the skin closed with 4-0 Vicryl.  Dermabond was applied.  The patient awoke neurologically intact.  Patient was transferred to the recovery room in stable condition.  All needle and sponge counts were correct.  Given the complexity of the case a first assistant was necessary in order to expedient the procedure and safely perform the technical aspects of the operation.  Waverly Ferrari, MD, FACS [facility]  DATE OF DICTATION:   12/03/2019

## 2019-12-03 NOTE — Progress Notes (Addendum)
Inpatient Rehabilitation Care Coordinator  Discharge Note  The overall goal for the admission was met for:   Discharge location: Yes, acute  Length of Stay: Yes  Discharge activity level: Yes  Home/community participation: Yes  Services provided included: MD, RD, PT, OT, SLP, RN, CM, TR, Pharmacy, Neuropsych and SW  Financial Services: Private Insurance: HTA  Follow-up services arranged: Home Health: Pending  Comments (or additional information): Waiting for acceptance of follow up referral.   Patient/Family verbalized understanding of follow-up arrangements: Yes  Individual responsible for coordination of the follow-up plan: [name], [phone]  Confirmed correct DME delivered: Dyanne Iha 12/03/2019    Dyanne Iha

## 2019-12-03 NOTE — Transfer of Care (Signed)
Immediate Anesthesia Transfer of Care Note  Patient: Chelsea [name]  Procedure(s) Performed: LEFT CAROTID ENDARTERECTOMY (Left ) PATCH ANGIOPLASTY Left Carotid Artery (Left )  Patient Location: PACU  Anesthesia Type:General  Level of Consciousness: drowsy and patient cooperative  Airway & Oxygen Therapy: Patient Spontanous Breathing and Patient connected to face mask oxygen  Post-op Assessment: Report given to RN and Post -op Vital signs reviewed and stable  Post vital signs: Reviewed and stable  Last Vitals:  Vitals Value Taken Time  BP 161/64 12/03/19 0954  Temp    Pulse 70 12/03/19 0957  Resp 24 12/03/19 0957  SpO2 96 % 12/03/19 0957  Vitals shown include unvalidated device data.  Last Pain: There were no vitals filed for this visit.       Complications: No complications documented.

## 2019-12-04 ENCOUNTER — Encounter (HOSPITAL_COMMUNITY): Payer: Self-pay | Admitting: Vascular Surgery

## 2019-12-04 LAB — BASIC METABOLIC PANEL
Anion gap: 9 (ref 5–15)
BUN: 16 mg/dL (ref 8–23)
CO2: 22 mmol/L (ref 22–32)
Calcium: 8.4 mg/dL — ABNORMAL LOW (ref 8.9–10.3)
Chloride: 99 mmol/L (ref 98–111)
Creatinine, Ser: 1.03 mg/dL — ABNORMAL HIGH (ref 0.44–1.00)
GFR, Estimated: 57 mL/min — ABNORMAL LOW (ref >60)
Glucose, Bld: 249 mg/dL — ABNORMAL HIGH (ref 70–99)
Potassium: 4.5 mmol/L (ref 3.5–5.1)
Sodium: 130 mmol/L — ABNORMAL LOW (ref 135–145)

## 2019-12-04 LAB — CBC
HCT: 30.2 % — ABNORMAL LOW (ref 36.0–46.0)
Hemoglobin: 10.4 g/dL — ABNORMAL LOW (ref 12.0–15.0)
MCH: 31.9 pg (ref 26.0–34.0)
MCHC: 34.4 g/dL (ref 30.0–36.0)
MCV: 92.6 fL (ref 80.0–100.0)
Platelets: 312 10*3/uL (ref 150–400)
RBC: 3.26 MIL/uL — ABNORMAL LOW (ref 3.87–5.11)
RDW: 12.1 % (ref 11.5–15.5)
WBC: 12.4 10*3/uL — ABNORMAL HIGH (ref 4.0–10.5)
nRBC: 0 % (ref 0.0–0.2)

## 2019-12-04 LAB — GLUCOSE, CAPILLARY
Glucose-Capillary: 181 mg/dL — ABNORMAL HIGH (ref 70–99)
Glucose-Capillary: 199 mg/dL — ABNORMAL HIGH (ref 70–99)
Glucose-Capillary: 173 mg/dL — ABNORMAL HIGH (ref 70–99)

## 2019-12-04 MED ORDER — PHENYLEPHRINE HCL-NACL 10-0.9 MG/250ML-% IV SOLN
INTRAVENOUS | Status: AC
  Filled 2019-12-04: qty 500

## 2019-12-04 NOTE — Evaluation (Signed)
Occupational Therapy Evaluation Patient Details Name: Chelsea [name] MRN: [id number] DOB: 1945/11/26 Today's Date: 12/04/2019    History of Present Illness Pt is a 74 year old female who presented to the hospital 11/05/19 with 1 week hx of frequent falls, subtle R leg weakness, L visual deficits, nausea, and vomiting. MRI revealed R hippocampus infarct. PMH includes HTN (uncontrolled, has not seen an MD in 15 years), cardiac arrest, blindness in L eye, DM-2, and left carotid artery stenosis. She was working with multiple therapy services in the CIR setting since 11/10/19 and was planning to return home with [facility] therapy services following her surgery. She then received L carotid endarterectomy surgical  intervention on 12/03/19 secondary to her > 80% L carotid stenosis.    Clinical Impression   Prior to sx Pt from CIR - working with OT at min guard level for transfers with RW and requiring assist for LB ADL, working on activity tolerance and maximize safety and independence. Prior to that she used a RW for ambulation but primarily was independent in ADL. Today she is min A for transfers, mod A for LB ADL, dependent on BUE for standing balance - requiring seated grooming and ADL activities. Pt on 2L O2 throughout session. Daughter present and very aware of needs going home - excellent preparation and awareness as a caregiver. At this time recommending skilled OT in the acute setting with HHOT post-acute to maximize safety and independence in ADL and functional transfers. Next session to focus on energy conservation and bathroom transfers.     Follow Up Recommendations  Home health OT;Supervision/Assistance - 24 hour    Equipment Recommendations   (Pt has appropriate DME in home)    Recommendations for Other Services       Precautions / Restrictions Precautions Precautions: Fall;Other (comment) (supplemental O2 via [place]) Restrictions Weight Bearing Restrictions: No      Mobility Bed  Mobility Overal bed mobility: Needs Assistance Bed Mobility: Sit to Supine       Sit to supine: Mod assist   General bed mobility comments: to assist with BLE back into bed    Transfers Overall transfer level: Needs assistance Equipment used: Rolling walker (2 wheeled) Transfers: Sit to/from Stand Sit to Stand: Min assist         General transfer comment: min A for balance and boost from recliner    Balance Overall balance assessment: Needs assistance Sitting-balance support: Feet supported Sitting balance-Leahy Scale: Good Sitting balance - Comments: Static sitting no UE support and dynamic sitting 2-no UE support and min guard assist for safety. Postural control: Posterior lean Standing balance support: Bilateral upper extremity supported Standing balance-Leahy Scale: Poor Standing balance comment: dependent on BUE in standing                           ADL either performed or assessed with clinical judgement   ADL Overall ADL's : Needs assistance/impaired Eating/Feeding: Set up;Sitting   Grooming: Set up;Sitting   Upper Body Bathing: Minimal assistance;Sitting   Lower Body Bathing: Moderate assistance;Sitting/lateral leans   Upper Body Dressing : Min guard;Sitting   Lower Body Dressing: Moderate assistance;Sit to/from stand   Toilet Transfer: Minimal assistance;Ambulation;RW Toilet Transfer Details (indicate cue type and reason): good hand placement for transfer and safety awareness Toileting- Clothing Manipulation and Hygiene: Moderate assistance;Sit to/from stand       Functional mobility during ADLs: Minimal assistance;Rolling walker       Vision  Perception     Praxis      Pertinent Vitals/Pain Pain Assessment: Faces Faces Pain Scale: Hurts even more Pain Location: L shoulder with activity Pain Descriptors / Indicators: Discomfort;Grimacing;Guarding Pain Intervention(s): Limited activity within patient's  tolerance;Monitored during session     Hand Dominance Right   Extremity/Trunk Assessment Upper Extremity Assessment Upper Extremity Assessment: LUE deficits/detail LUE Deficits / Details: baseline L posterior shoulder pain  LUE Coordination: decreased gross motor   Lower Extremity Assessment Lower Extremity Assessment: Defer to PT evaluation   Cervical / Trunk Assessment Cervical / Trunk Assessment: Normal   Communication Communication Communication: No difficulties   Cognition Arousal/Alertness: Awake/alert Behavior During Therapy: WFL for tasks assessed/performed Overall Cognitive Status: History of cognitive impairments - at baseline                                 General Comments: A&Ox4, pt and pt's daughter reported knowledge of safety and appropriate changes to maintain safety at baseline. Pt perseverates on objects or tasks and requires reminders to remain on task.   General Comments  on 2L of O2 throughout session    Exercises     Shoulder Instructions      Home Living Family/patient expects to be discharged to:: Private residence Living Arrangements: Children Available Help at Discharge: Family;Available 24 hours/day Type of Home: House Home Access: Ramped entrance;Stairs to enter (ramp installed recently) Entrance Stairs-Number of Steps: 4 Entrance Stairs-Rails: Can reach both Home Layout: One level     Bathroom Shower/Tub: Tub/shower unit;Curtain   Bathroom Toilet: Handicapped height (commode in place) Bathroom Accessibility: Yes (other than shower)   Home Equipment: Emergency planning/management officer - 2 wheels;Bedside commode;Grab bars - tub/shower;Grab bars - toilet;Other (comment) (therapeutic bed with controls)   Additional Comments: lift chair  Lives With: Family    Prior Functioning/Environment Level of Independence: Independent with assistive device(s) (used walker)        Comments: Pt reports more recently requiring assistance for all  mobility and ADLs, but prior to admission she was IND with mobility but having falls regularly.         OT Problem List: Decreased strength;Decreased knowledge of use of DME or AE;Decreased knowledge of precautions;Decreased activity tolerance;Decreased cognition;Impaired balance (sitting and/or standing);Decreased safety awareness      OT Treatment/Interventions: Self-care/ADL training;Therapeutic exercise;Patient/family education;Balance training;Neuromuscular education;Energy conservation;Therapeutic activities;DME and/or AE instruction    OT Goals(Current goals can be found in the care plan section) Acute Rehab OT Goals Patient Stated Goal: to go home OT Goal Formulation: With patient/family Time For Goal Achievement: 12/18/19 Potential to Achieve Goals: Good ADL Goals Pt Will Perform Grooming: with supervision;standing Pt Will Perform Upper Body Dressing: with min guard assist;with caregiver independent in assisting Pt Will Perform Lower Body Dressing: with caregiver independent in assisting;sit to/from stand;with min guard assist Pt Will Transfer to Toilet: with supervision;ambulating Pt Will Perform Toileting - Clothing Manipulation and hygiene: with min guard assist;with caregiver independent in assisting;sit to/from stand  OT Frequency: Min 2X/week   Barriers to D/C:            Co-evaluation              AM-PAC OT "6 Clicks" Daily Activity     Outcome Measure Help from another person eating meals?: A Little Help from another person taking care of personal grooming?: A Little Help from another person toileting, which includes using toliet, bedpan, or urinal?: A Little  Help from another person bathing (including washing, rinsing, drying)?: A Lot Help from another person to put on and taking off regular upper body clothing?: A Little Help from another person to put on and taking off regular lower body clothing?: A Lot 6 Click Score: 16   End of Session Equipment  Utilized During Treatment: Gait belt;Rolling walker;Oxygen (2L) Nurse Communication: Mobility status  Activity Tolerance: Patient tolerated treatment well Patient left: in bed;with call bell/phone within reach;with bed alarm set;with family/visitor present  OT Visit Diagnosis: Unsteadiness on feet (R26.81);Other abnormalities of gait and mobility (R26.89);Other symptoms and signs involving cognitive function;Muscle weakness (generalized) (M62.81);History of falling (Z91.81)                Time: 1308-6578 OT Time Calculation (min): 24 min Charges:  OT General Charges $OT Visit: 1 Visit OT Evaluation $OT Eval Moderate Complexity: 1 Mod OT Treatments $Self Care/Home Management : 8-22 mins  Nyoka Cowden OTR/L Acute Rehabilitation Services Pager: [phone] Office: [phone]  Evern Bio Joua Bake 12/04/2019, 1:58 PM

## 2019-12-04 NOTE — Evaluation (Signed)
Physical Therapy Evaluation Patient Details Name: Chelsea [name] MRN: [id number] DOB: [birth year] Today's Date: 12/04/2019   History of Present Illness  Pt is a 74 year old female who presented to the hospital 11/05/19 with 1 week hx of frequent falls, subtle R leg weakness, L visual deficits, nausea, and vomiting. MRI revealed R hippocampus infarct. PMH includes HTN (uncontrolled, has not seen an MD in 15 years), cardiac arrest, blindness in L eye, DM-2, and left carotid artery stenosis. She was working with multiple therapy services in the CIR setting since 11/10/19 and was planning to return home with [facility] therapy services following her surgery. She then received L carotid endarterectomy surgical  intervention on 12/03/19 secondary to her > 80% L carotid stenosis.   Clinical Impression  Pt presents with condition mentioned above. She has been receiving therapy services in the CIR setting prior to this procedure and is expected to d/c home soon. She has 24/7 assistance/supervision available at home along with a ramp, grab bars, lift chair, therapeutic bed, shower chair, commode, and RW in place. She could benefit from a manual w/c for safe mobility in the community and for longer distance mobility within the home. She required modA to come to sit on the EOB without use of the bed rails or controls due to shoulder pain. She required minA to come to stand and improve her posture and balance prior to ambulating ~40 ft with a RW with minA this date. Will continue to follow acutely and recommending HH PT upon d/c home to address her deficits (see PT Problem List) to maximize her independence and safety with all functional mobility.     Follow Up Recommendations Home health PT;Supervision/Assistance - 24 hour    Equipment Recommendations  Wheelchair (measurements PT);Wheelchair cushion (measurements PT)    Recommendations for Other Services       Precautions / Restrictions Precautions Precautions:  Fall;Other (comment) (supplemental O2 via [place]) Restrictions Weight Bearing Restrictions: No      Mobility  Bed Mobility Overal bed mobility: Needs Assistance Bed Mobility: Supine to Sit     Supine to sit: Mod assist     General bed mobility comments: Bed flat and cuing pt to avoid use of bed rails but rather pull on edge of mattress to assist with trunk ascension. ModA to manage trunk due to L shoulder pain.    Transfers Overall transfer level: Needs assistance Equipment used: Rolling walker (2 wheeled) Transfers: Sit to/from Stand Sit to Stand: Min assist         General transfer comment: Repeated tactile and verbal cues to maintain hands on bed rather than RW to push up to stand. MinA to power up to stand and maintain balance as she was originally flexing at hips and leaning posteriorly.  Ambulation/Gait Ambulation/Gait assistance: Min assist Gait Distance (Feet): 40 Feet Assistive device: Rolling walker (2 wheeled) Gait Pattern/deviations: Step-through pattern;Decreased stride length;Trunk flexed Gait velocity: decr Gait velocity interpretation: <1.31 ft/sec, indicative of household ambulator General Gait Details: Cues provided to remain within RW and to direct pt with turns. MinA to maintain balance, but no overt LOB. Pt ambulates at slow speed with decreased stride length.  Stairs            Wheelchair Mobility    Modified Rankin (Stroke Patients Only) Modified Rankin (Stroke Patients Only) Pre-Morbid Rankin Score: Slight disability Modified Rankin: Moderately severe disability     Balance Overall balance assessment: Needs assistance Sitting-balance support: Feet supported Sitting balance-Leahy Scale: Good  Sitting balance - Comments: Static sitting no UE support and dynamic sitting 2-no UE support and min guard assist for safety. Postural control: Posterior lean Standing balance support: Bilateral upper extremity supported Standing balance-Leahy Scale:  Poor Standing balance comment: Reliant on UE support on RW, originally leaning posteriorly with trunk flexed. Cued pt to look superiorly and anteriorly and improve posture with success and progressed from minA-min guard assist to maintain static standing balance.                             Pertinent Vitals/Pain Pain Assessment: Faces Faces Pain Scale: Hurts little more Pain Location: L shoulder with bed mobility Pain Descriptors / Indicators: Discomfort;Grimacing;Guarding Pain Intervention(s): Limited activity within patient's tolerance;Monitored during session;Repositioned    Home Living Family/patient expects to be discharged to:: Private residence Living Arrangements: Children Available Help at Discharge: Family;Available 24 hours/day Type of Home: House Home Access: Ramped entrance;Stairs to enter (ramp installed recently) Entrance Stairs-Rails: Can reach both Entrance Stairs-Number of Steps: 4 Home Layout: One level Home Equipment: Emergency planning/management officer - 2 wheels;Bedside commode;Grab bars - tub/shower;Grab bars - toilet;Other (comment) (therapeutic bed with controls) Additional Comments: lift chair    Prior Function Level of Independence: Independent with assistive device(s) (used walker)         Comments: Pt reports more recently requiring assistance for all mobility and ADLs, but prior to admission she was IND with mobility but having falls regularly.      Hand Dominance   Dominant Hand: Right    Extremity/Trunk Assessment   Upper Extremity Assessment Upper Extremity Assessment: Defer to OT evaluation    Lower Extremity Assessment Lower Extremity Assessment: RLE deficits/detail;LLE deficits/detail RLE Deficits / Details: MMT scores = hip flexion 3+ otherwise grossly 4- to 4+ throughout RLE Sensation: WNL RLE Coordination: WNL LLE Deficits / Details: MMT scores of 4 to 5 grossly throughout LLE Sensation: WNL LLE Coordination: WNL    Cervical /  Trunk Assessment Cervical / Trunk Assessment: Normal  Communication   Communication: No difficulties  Cognition Arousal/Alertness: Awake/alert Behavior During Therapy: WFL for tasks assessed/performed Overall Cognitive Status: Within Functional Limits for tasks assessed                                 General Comments: A&Ox4, pt and pt's daughter reported knowledge of safety and appropriate changes to maintain safety at baseline. Pt perseverates on objects or tasks and requires reminders to remain on task.      General Comments General comments (skin integrity, edema, etc.): Attempted to wean supplemental O2 via [place] but with mobility she desats to 92-93% on 1L/min thus increased to 2 L/min to maintain sats >/= 95%. BP 140s/50s throughout session with no increase in dizziness or nausea noted.    Exercises     Assessment/Plan    PT Assessment Patient needs continued PT services  PT Problem List Decreased strength;Decreased balance;Decreased mobility;Decreased knowledge of use of DME;Decreased knowledge of precautions;Decreased activity tolerance;Pain       PT Treatment Interventions Gait training;DME instruction;Functional mobility training;Stair training;Therapeutic activities;Therapeutic exercise;Balance training;Patient/family education;Neuromuscular re-education    PT Goals (Current goals can be found in the Care Plan section)  Acute Rehab PT Goals Patient Stated Goal: to go home PT Goal Formulation: With patient Time For Goal Achievement: 12/18/19 Potential to Achieve Goals: Good    Frequency Min 3X/week   Barriers to discharge  Co-evaluation               AM-PAC PT "6 Clicks" Mobility  Outcome Measure Help needed turning from your back to your side while in a flat bed without using bedrails?: A Little Help needed moving from lying on your back to sitting on the side of a flat bed without using bedrails?: A Little Help needed moving to and  from a bed to a chair (including a wheelchair)?: A Little Help needed standing up from a chair using your arms (e.g., wheelchair or bedside chair)?: A Little Help needed to walk in hospital room?: A Little Help needed climbing 3-5 steps with a railing? : A Lot 6 Click Score: 17    End of Session Equipment Utilized During Treatment: Gait belt;Oxygen Activity Tolerance: Patient tolerated treatment well Patient left: in chair;with call bell/phone within reach;with chair alarm set;with family/visitor present (daughter present) Nurse Communication: Mobility status;Other (comment) (urinary incontinence) PT Visit Diagnosis: Unsteadiness on feet (R26.81);Muscle weakness (generalized) (M62.81);Other abnormalities of gait and mobility (R26.89)    Time: 3532-9924 PT Time Calculation (min) (ACUTE ONLY): 60 min   Charges:   PT Evaluation $PT Eval Moderate Complexity: 1 Mod PT Treatments $Gait Training: 8-22 mins $Therapeutic Activity: 23-37 mins        Raymond Gurney, PT, DPT Acute Rehabilitation Services  Pager: [phone] Office: [phone]   Jewel Baize 12/04/2019, 10:57 AM

## 2019-12-04 NOTE — Discharge Instructions (Signed)
   [facility]  Discharge Instructions   Carotid Endarterectomy (CEA)  Please refer to the following instructions for your post-procedure care. Your surgeon or physician assistant will discuss any changes with you.  Activity  You are encouraged to walk as much as you can. You can slowly return to normal activities but must avoid strenuous activity and heavy lifting until your doctor tell you it's OK. Avoid activities such as vacuuming or swinging a golf club. You can drive after one week if you are comfortable and you are no longer taking prescription pain medications. It is normal to feel tired for serval weeks after your surgery. It is also normal to have difficulty with sleep habits, eating, and bowel movements after surgery. These will go away with time.  Bathing/Showering  You may shower after you come home. Do not soak in a bathtub, hot tub, or swim until the incision heals completely.  Incision Care  Shower every day. Clean your incision with mild soap and water. Pat the area dry with a clean towel. You do not need a bandage unless otherwise instructed. Do not apply any ointments or creams to your incision. You may have skin glue on your incision. Do not peel it off. It will come off on its own in about one week. Your incision may feel thickened and raised for several weeks after your surgery. This is normal and the skin will soften over time. For Men Only: It's OK to shave around the incision but do not shave the incision itself for 2 weeks. It is common to have numbness under your chin that could last for several months.  Diet  Resume your normal diet. There are no special food restrictions following this procedure. A low fat/low cholesterol diet is recommended for all patients with vascular disease. In order to heal from your surgery, it is CRITICAL to get adequate nutrition. Your body requires vitamins, minerals, and protein. Vegetables are the best  source of vitamins and minerals. Vegetables also provide the perfect balance of protein. Processed food has little nutritional value, so try to avoid this.        Medications  Resume taking all of your medications unless your doctor or physician assistant tells you not to. If your incision is causing pain, you may take over-the- counter pain relievers such as acetaminophen (Tylenol). If you were prescribed a stronger pain medication, please be aware these medications can cause nausea and constipation. Prevent nausea by taking the medication with a snack or meal. Avoid constipation by drinking plenty of fluids and eating foods with a high amount of fiber, such as fruits, vegetables, and grains. Do not take Tylenol if you are taking prescription pain medications.  Follow Up  Our office will schedule a follow up appointment 2-3 weeks following discharge.  Please call us immediately for any of the following conditions  Increased pain, redness, drainage (pus) from your incision site. Fever of 101 degrees or higher. If you should develop stroke (slurred speech, difficulty swallowing, weakness on one side of your body, loss of vision) you should call 911 and go to the nearest emergency room.  Reduce your risk of vascular disease:  Stop smoking. If you would like help call [facility] at 1-800-QUIT-NOW (1-800-784-8669) or [place] at 336-586-4000. Manage your cholesterol Maintain a desired weight Control your diabetes Keep your blood pressure down  If you have any questions, please call the office at [phone].   

## 2019-12-04 NOTE — Progress Notes (Addendum)
  Progress Note    12/04/2019 7:16 AM 1 Day Post-Op  Subjective:  Has not been out of bed since surgery and does not believe she is ready for discharge home.  She denies stroke like symptoms including slurring speech, changes in vision, or one sided weakness.   Vitals:   12/04/19 0200 12/04/19 0600  BP: (!) 131/54 (!) 134/52  Pulse: 62 62  Resp: 16 20  Temp: 98.5 F (36.9 C) 97.9 F (36.6 C)  SpO2: 97% 96%   Physical Exam Lungs:  Non labored on O2 by [place] Incisions:  L neck incision c/d/i Neurologic: A&O  CBC    Component Value Date/Time   WBC 12.4 (H) 12/04/2019 0102   RBC 3.26 (L) 12/04/2019 0102   HGB 10.4 (L) 12/04/2019 0102   HCT 30.2 (L) 12/04/2019 0102   PLT 312 12/04/2019 0102   MCV 92.6 12/04/2019 0102   MCH 31.9 12/04/2019 0102   MCHC 34.4 12/04/2019 0102   RDW 12.1 12/04/2019 0102   LYMPHSABS 1.8 12/01/2019 1536   MONOABS 0.7 12/01/2019 1536   EOSABS 0.5 12/01/2019 1536   BASOSABS 0.0 12/01/2019 1536    BMET    Component Value Date/Time   NA 130 (L) 12/04/2019 0102   K 4.5 12/04/2019 0102   CL 99 12/04/2019 0102   CO2 22 12/04/2019 0102   GLUCOSE 249 (H) 12/04/2019 0102   BUN 16 12/04/2019 0102   CREATININE 1.03 (H) 12/04/2019 0102   CALCIUM 8.4 (L) 12/04/2019 0102   GFRNONAA 57 (L) 12/04/2019 0102   GFRAA  09/17/2008 1612    >60        The eGFR has been calculated using the MDRD equation. This calculation has not been validated in all clinical situations. eGFR's persistently <60 mL/min signify possible Chronic Kidney Disease.    INR    Component Value Date/Time   INR 1.0 11/08/2019 0406     Intake/Output Summary (Last 24 hours) at 12/04/2019 0716 Last data filed at 12/04/2019 0620 Gross per 24 hour  Intake 2412.5 ml  Output 610 ml  Net 1802.5 ml     Assessment/Plan:  74 y.o. female is s/p L CEA 1 Day Post-Op   Neuro exam at baseline CIR states plan is for patient to go home with [facility] however several [place] agencies have denied  her insurance; CSW to follow up today PT/OT re-consulted to work with patient today [place] for discharge when [facility] arranged   Dagoberto Ligas, PA-C [facility] [phone] 12/04/2019 7:16 AM  I have interviewed the patient and examined the patient. I agree with the findings by the PA.  Neuro intact.  Her incision looks fine.  Physical therapy and Occupational Therapy are evaluating the patient.  CIR felt that she could potentially go home with home health physical therapy.  Gae Gallop, MD [phone]

## 2019-12-04 NOTE — TOC Initial Note (Signed)
Transition of Care (TOC) - Initial/Assessment Note  Sander Radon, BSN Transitions of Care Unit 4E- RN Case Manager See Treatment Team for direct phone #    Patient Details  Name: Chelsea [name] MRN: [id number] Date of Birth: 12-Dec-1945  Transition of Care [facility]) CM/SW Contact:    Darrold Span, RN Phone Number: 12/04/2019, 3:09 PM  Clinical Narrative:                 Pt from CIR s/p CEA- noted plan to return home with [facility] and not go back to INPT Rehab- spoke with CSW Trula Ore in [place] INPT rehab who has been working on transition needs for return home- Per Trula Ore she has called multiple HH agencies and awaiting return call from a few others and at this time has not been able to secure [facility] services for this week- she states daughter is aware and Trula Ore is to work on it after the holidays to secure needed [facility] PT/OT. Trula Ore has also already set patient up with needed DME- from [employer]- some equipment has already been delivered- w/c and RW pending delivery per [place].  At this time no further TOC needs noted.   Expected Discharge Plan: Home w Home Health Services Barriers to Discharge: Continued Medical Work up   Patient Goals and CMS Choice Patient states their goals for this hospitalization and ongoing recovery are:: return home with [facility] CMS Medicare.gov Compare Post Acute Care list provided to:: Patient Represenative (must comment) Choice offered to / list presented to : Patient, Adult Children  Expected Discharge Plan and Services Expected Discharge Plan: Home w Home Health Services   Discharge Planning Services: CM Consult Post Acute Care Choice: Home Health, Durable Medical Equipment Living arrangements for the past 2 months: Single Family Home                 DME Arranged: Shower stool, Walker rolling, Community education officer wheelchair with seat cushion DME Agency: [employer] (DME arranged per CIR- CSW [place])       HH Arranged: PT, OT          Prior  Living Arrangements/Services Living arrangements for the past 2 months: Single Family Home Lives with:: Self, Adult Children Patient language and need for interpreter reviewed:: Yes Do you feel safe going back to the place where you live?: Yes      Need for Family Participation in Patient Care: Yes (Comment) Care giver support system in place?: Yes (comment) Current home services: DME Criminal Activity/Legal Involvement Pertinent to Current Situation/Hospitalization: No - Comment as needed  Activities of Daily Living      Permission Sought/Granted Permission sought to share information with : Magazine features editor                Emotional Assessment       Orientation: : Oriented to Self, Oriented to Place, Oriented to  Time Alcohol / Substance Use: Not Applicable Psych Involvement: No (comment)  Admission diagnosis:  Left carotid artery stenosis [I65.22] Patient Active Problem List   Diagnosis Date Noted  . Left carotid artery stenosis 12/03/2019  . Stroke (HCC)   . Pain   . Acute ischemic right posterior cerebral artery (PCA) stroke (HCC) 11/09/2019  . Primary hypertension   . Controlled type 2 diabetes mellitus with hyperglycemia (HCC)   . Postherpetic neuralgia   . Uncontrolled type 2 diabetes mellitus with hyperglycemia, without long-term current use of insulin (HCC) 11/07/2019  . Hypertensive urgency 11/07/2019  . Stenosis of  left internal carotid artery 11/07/2019  . Intractable nausea and vomiting 11/07/2019  . Nicotine dependence, cigarettes, uncomplicated 11/07/2019  . HZV (herpes zoster virus) post herpetic neuralgia 11/07/2019  . Weakness of right lower extremity 11/07/2019  . CVA (cerebral vascular accident) (HCC) 11/07/2019   PCP:  Mattie Marlin, DO Pharmacy:   [facility] [address], [place] - [address] [place] [address] Phone: [phone] Fax: [phone]     Social Determinants of Health (SDOH)  Interventions    Readmission Risk Interventions No flowsheet data found.

## 2019-12-05 LAB — GLUCOSE, CAPILLARY
Glucose-Capillary: 207 mg/dL — ABNORMAL HIGH (ref 70–99)
Glucose-Capillary: 116 mg/dL — ABNORMAL HIGH (ref 70–99)
Glucose-Capillary: 273 mg/dL — ABNORMAL HIGH (ref 70–99)
Glucose-Capillary: 118 mg/dL — ABNORMAL HIGH (ref 70–99)

## 2019-12-05 MED ORDER — ATORVASTATIN CALCIUM 80 MG PO TABS: 80.0000 mg | ORAL_TABLET | Freq: Every day | ORAL | 0 refills | Status: DC

## 2019-12-05 MED ORDER — LISINOPRIL 2.5 MG PO TABS: 2.5000 mg | ORAL_TABLET | Freq: Every day | ORAL | 0 refills | Status: DC

## 2019-12-05 MED ORDER — CARVEDILOL 3.125 MG PO TABS: 3.1250 mg | ORAL_TABLET | Freq: Every day | ORAL | 0 refills | Status: DC

## 2019-12-05 MED ORDER — ASCORBIC ACID 500 MG PO TABS: 500.0000 mg | ORAL_TABLET | Freq: Every day | ORAL | 0 refills | Status: DC

## 2019-12-05 MED ORDER — AMLODIPINE BESYLATE 10 MG PO TABS: 10.0000 mg | ORAL_TABLET | Freq: Every day | ORAL | 0 refills | Status: DC

## 2019-12-05 MED ORDER — CLOPIDOGREL BISULFATE 75 MG PO TABS: 75.0000 mg | ORAL_TABLET | Freq: Every day | ORAL | 0 refills | Status: DC

## 2019-12-05 MED ORDER — TRAMADOL HCL 50 MG PO TABS
50.0000 mg | ORAL_TABLET | Freq: Two times a day (BID) | ORAL | 0 refills | Status: DC | PRN
Start: 2019-12-05 — End: 2020-03-18

## 2019-12-05 MED ORDER — PREGABALIN 100 MG PO CAPS: 100.0000 mg | ORAL_CAPSULE | Freq: Every evening | ORAL | 0 refills | Status: DC

## 2019-12-05 MED ORDER — POLYETHYLENE GLYCOL 3350 17 G PO PACK: 17.0000 g | PACK | Freq: Every day | ORAL | 0 refills | Status: DC

## 2019-12-05 MED ORDER — METFORMIN HCL 500 MG PO TABS: 500.0000 mg | ORAL_TABLET | Freq: Two times a day (BID) | ORAL | 0 refills | Status: DC

## 2019-12-05 MED ORDER — TERAZOSIN HCL 2 MG PO CAPS: 2.0000 mg | ORAL_CAPSULE | Freq: Every day | ORAL | 0 refills | Status: DC

## 2019-12-05 MED ORDER — SCOPOLAMINE 1 MG/3DAYS TD PT72: 1.0000 | MEDICATED_PATCH | TRANSDERMAL | 1 refills | Status: DC

## 2019-12-05 MED ORDER — ASPIRIN EC 81 MG PO TBEC: 81.0000 mg | DELAYED_RELEASE_TABLET | Freq: Every day | ORAL | 11 refills | Status: DC

## 2019-12-05 NOTE — TOC Transition Note (Signed)
Transition of Care (TOC) - CM/SW Discharge Note Donn Pierini RN, BSN Transitions of Care Unit 4E- RN Case Manager See Treatment Team for direct phone #    Patient Details  Name: Chelsea [name] MRN: [id number] Date of Birth: [birth year]  Transition of Care [facility]) CM/SW Contact:  Darrold Span, RN Phone Number: 12/05/2019, 1:40 PM   Clinical Narrative:    Pt stable for transition home s/p CEA- per conversation with Trula Ore CSW in CIR- no HH agencies able to accept pt this week- she will work to secure [facility] services after the Thanksgiving Holiday for PT/OT needs- DME has been ordered by [place] with CIR from [employer] and is pending delivery to the home.    Final next level of care: Home w Home Health Services Barriers to Discharge: Continued Medical Work up   Patient Goals and CMS Choice Patient states their goals for this hospitalization and ongoing recovery are:: return home with [facility] CMS Medicare.gov Compare Post Acute Care list provided to:: Patient Represenative (must comment) Choice offered to / list presented to : Patient, Adult Children  Discharge Placement               Home        Discharge Plan and Services   Discharge Planning Services: CM Consult Post Acute Care Choice: Home Health, Durable Medical Equipment          DME Arranged: Shower stool, Walker rolling, Community education officer wheelchair with seat cushion DME Agency: [employer] (DME arranged per CIR- CSW [place])       HH Arranged: PT, OT          Social Determinants of Health (SDOH) Interventions     Readmission Risk Interventions Readmission Risk Prevention Plan 12/05/2019  Transportation Screening Complete  PCP or Specialist Appt within 3-5 Days Complete  HRI or Home Care Consult Complete  Social Work Consult for Recovery Care Planning/Counseling Complete  Palliative Care Screening Not Applicable  Medication Review Oceanographer) Complete  Some recent data might be hidden

## 2019-12-05 NOTE — Progress Notes (Signed)
° °  VASCULAR SURGERY ASSESSMENT & PLAN:   POD 2 L CEA: Doing well.  She is ready to go home today with home health physical therapy.  VASCULAR QUALITY INITIATIVE: She is on aspirin and is on a statin.   SUBJECTIVE:   No complaints.  Wants to go home.  PHYSICAL EXAM:   Vitals:   12/04/19 1711 12/04/19 2000 12/05/19 0000 12/05/19 0343  BP: (!) 145/69 (!) 113/56 (!) 135/57 (!) 133/57  Pulse: (!) 59   (!) 56  Resp: (!) 22 14 14 13   Temp: 97.8 F (36.6 C) 98.5 F (36.9 C) 98.1 F (36.7 C) 97.9 F (36.6 C)  TempSrc: Oral Oral Oral Oral  SpO2: 93% 93% 92% 90%  Weight:      Height:       NEURO: No focal weakness or paresthesias. Her left neck incision looks fine.  LABS:   Lab Results  Component Value Date   WBC 12.4 (H) 12/04/2019   HGB 10.4 (L) 12/04/2019   HCT 30.2 (L) 12/04/2019   MCV 92.6 12/04/2019   PLT 312 12/04/2019   Lab Results  Component Value Date   CREATININE 1.03 (H) 12/04/2019   Lab Results  Component Value Date   INR 1.0 11/08/2019   CBG (last 3)  Recent Labs    12/04/19 0612 12/04/19 1212 12/04/19 1709  GLUCAP 181* 199* 173*    PROBLEM LIST:    Active Problems:   Left carotid artery stenosis   CURRENT MEDS:    amLODipine  10 mg Oral Daily   ascorbic acid  500 mg Oral Daily   aspirin EC  81 mg Oral Daily   atorvastatin  80 mg Oral Daily   carvedilol  3.125 mg Oral Daily   clopidogrel  75 mg Oral Daily   docusate sodium  100 mg Oral Daily   hydroxypropyl methylcellulose / hypromellose  1 drop Left Eye TID   insulin aspart  0-9 Units Subcutaneous TID WC   lisinopril  2.5 mg Oral Daily   pantoprazole  40 mg Oral Daily   polyethylene glycol  17 g Oral Daily   pregabalin  100 mg Oral QPM   scopolamine  1 patch Transdermal Q72H   terazosin  2 mg Oral QHS    12/06/19 Office: [phone] 12/05/2019

## 2019-12-05 NOTE — Progress Notes (Signed)
Physical Therapy Treatment Patient Details Name: Chelsea [name] MRN: [id number] DOB: 09-04-1945 Today's Date: 12/05/2019    History of Present Illness Pt is a 74 year old female who presented to the hospital 11/05/19 with 1 week hx of frequent falls, subtle R leg weakness, L visual deficits, nausea, and vomiting. MRI revealed R hippocampus infarct. PMH includes HTN (uncontrolled, has not seen an MD in 15 years), cardiac arrest, blindness in L eye, DM-2, and left carotid artery stenosis. She was working with multiple therapy services in the CIR setting since 11/10/19 and was planning to return home with [facility] therapy services following her surgery. She then received L carotid endarterectomy surgical  intervention on 12/03/19 secondary to her > 80% L carotid stenosis.     PT Comments    Pt is very unsteady to mobilize and will require use of gt belt at home and [facility] for increased distances.  Pt fatigues very quickly and limited during session.  Plan for home health PT with support from her daughter.  Pt very limited due to visual deficits.     Follow Up Recommendations  Home health PT;Supervision/Assistance - 24 hour     Equipment Recommendations  Wheelchair (measurements PT);Wheelchair cushion (measurements PT)    Recommendations for Other Services       Precautions / Restrictions Precautions Precautions: Fall Restrictions Weight Bearing Restrictions: No    Mobility  Bed Mobility Overal bed mobility: Needs Assistance Bed Mobility: Supine to Sit   Sidelying to sit: Mod assist       General bed mobility comments: Mod assistance to move from bed to sitting.  Pt required assistance to advance LEs to edge of bed and elevate trunk into a seated position.  Transfers Overall transfer level: Needs assistance Equipment used: Rolling walker (2 wheeled) Transfers: Sit to/from Stand Sit to Stand: Min assist         General transfer comment: Min A for balance and assistance to boost  from recliner into standing.  Ambulation/Gait Ambulation/Gait assistance: Min assist Gait Distance (Feet): 30 Feet (around her room.) Assistive device: Rolling walker (2 wheeled) Gait Pattern/deviations: Step-through pattern;Decreased stride length;Trunk flexed Gait velocity: decr   General Gait Details: Cues provided to remain within RW and to direct pt with turns due to poor vision.  MinA to maintain balance, but no overt LOB. Pt ambulates at slow speed with decreased stride length.  She is very challenged to maintain RW safety and would really benefit from [facility] for mobility greater than 10 ft.   Stairs             Wheelchair Mobility    Modified Rankin (Stroke Patients Only) Modified Rankin (Stroke Patients Only) Pre-Morbid Rankin Score: Slight disability Modified Rankin: Moderately severe disability     Balance Overall balance assessment: Needs assistance Sitting-balance support: Feet supported Sitting balance-Leahy Scale: Good Sitting balance - Comments: Static sitting no UE support and dynamic sitting 2-no UE support and min guard assist for safety. Postural control: Posterior lean Standing balance support: Bilateral upper extremity supported Standing balance-Leahy Scale: Poor Standing balance comment: dependent on BUE in standing                            Cognition Arousal/Alertness: Awake/alert Behavior During Therapy: WFL for tasks assessed/performed Overall Cognitive Status: History of cognitive impairments - at baseline Area of Impairment: Following commands;Safety/judgement;Problem solving  Following Commands: Follows one step commands with increased time Safety/Judgement: Decreased awareness of safety;Decreased awareness of deficits   Problem Solving: Decreased initiation;Difficulty sequencing;Requires tactile cues;Requires verbal cues General Comments: A&Ox4, pt and pt's daughter reported knowledge of safety and  appropriate changes to maintain safety at baseline. Pt perseverates on objects or tasks and requires reminders to remain on task.      Exercises      General Comments        Pertinent Vitals/Pain Pain Assessment: Faces Faces Pain Scale: Hurts even more Pain Location: L shoulder with activity Pain Descriptors / Indicators: Discomfort;Grimacing;Guarding Pain Intervention(s): Monitored during session;Repositioned    Home Living                      Prior Function            PT Goals (current goals can now be found in the care plan section) Acute Rehab PT Goals Patient Stated Goal: to go home Potential to Achieve Goals: Good Progress towards PT goals: Progressing toward goals    Frequency    Min 3X/week      PT Plan Current plan remains appropriate    Co-evaluation              AM-PAC PT "6 Clicks" Mobility   Outcome Measure  Help needed turning from your back to your side while in a flat bed without using bedrails?: A Little Help needed moving from lying on your back to sitting on the side of a flat bed without using bedrails?: A Little Help needed moving to and from a bed to a chair (including a wheelchair)?: A Little Help needed standing up from a chair using your arms (e.g., wheelchair or bedside chair)?: A Little Help needed to walk in hospital room?: A Little Help needed climbing 3-5 steps with a railing? : A Lot 6 Click Score: 17    End of Session Equipment Utilized During Treatment: Gait belt;Oxygen Activity Tolerance: Patient tolerated treatment well Patient left: in chair;with call bell/phone within reach;with chair alarm set;with family/visitor present Nurse Communication: Mobility status PT Visit Diagnosis: Unsteadiness on feet (R26.81);Muscle weakness (generalized) (M62.81);Other abnormalities of gait and mobility (R26.89)     Time: 1002-1030 PT Time Calculation (min) (ACUTE ONLY): 28 min  Charges:  $Gait Training: 8-22  mins $Therapeutic Activity: 8-22 mins                     Bonney Leitz , PTA Acute Rehabilitation Services Pager [phone] Office [phone]     Chelsea [name] 12/05/2019, 10:40 AM

## 2019-12-05 NOTE — Progress Notes (Signed)
Called PA [place] and [place] Delle Reining to ensure patient had prescriptions sent to pharmacy for all medications she will be taking at home. Pt/family given discharge instructions, medication lists, follow up appointments, and when to call the doctor.  Pt/family verbalizes understanding. Patient assisted to wheelchair and transported to main entrance. Thomas Hoff, RN

## 2019-12-05 NOTE — Progress Notes (Signed)
Called CIR pharmacist and unit pharmacist. Called CIR PA Rinaldo Cloud. Called CM. Daughter under impression that patient needed to continue antibiotics for UTI treatment. Spoke with PA Wynelle Link and OK for patient to discharge without additional antibiotics.  Patient rec'd PO macrobid while in CIR and prophylacic IV antibiotics (vanc). Daughter and patient made aware. Thomas Hoff, RN

## 2019-12-05 NOTE — Progress Notes (Signed)
Occupational Therapy Treatment Patient Details Name: Chelsea [name] MRN: [id number] DOB: 08/01/1945 Today's Date: 12/05/2019    History of present illness Pt is a 74 year old female who presented to the hospital 11/05/19 with 1 week hx of frequent falls, subtle R leg weakness, L visual deficits, nausea, and vomiting. MRI revealed R hippocampus infarct. PMH includes HTN (uncontrolled, has not seen an MD in 15 years), cardiac arrest, blindness in L eye, DM-2, and left carotid artery stenosis. She was working with multiple therapy services in the CIR setting since 11/10/19 and was planning to return home with [facility] therapy services following her surgery. She then received L carotid endarterectomy surgical  intervention on 12/03/19 secondary to her > 80% L carotid stenosis.    OT comments  Patient continues to present with the barriers listed below.  She has continued pain in her L neck and L shoulder after her procedure.  She is planning in a discharge today, will be staying her her daughter, and receiving HH services.  OT will continue to follow in the acute setting.    Follow Up Recommendations  Home health OT;Supervision/Assistance - 24 hour    Equipment Recommendations       Recommendations for Other Services      Precautions / Restrictions Precautions Precautions: Fall Restrictions Weight Bearing Restrictions: No       Mobility Bed Mobility   Bed Mobility: Supine to Sit     Supine to sit: Mod assist        Transfers   Equipment used: Rolling walker (2 wheeled) Transfers: Sit to/from Stand Sit to Stand: Min assist              Balance           Standing balance support: Bilateral upper extremity supported Standing balance-Leahy Scale: Poor                             ADL either performed or assessed with clinical judgement   ADL                           Toilet Transfer: Minimal assistance;Ambulation;RW Toilet Transfer Details  (indicate cue type and reason): cues to scan her environment to the left Toileting- Clothing Manipulation and Hygiene: Supervision/safety;Sit to/from stand       Functional mobility during ADLs: Minimal assistance;Rolling walker General ADL Comments: Increased assist from a lower surface.     Vision Patient Visual Report: Peripheral vision impairment;No change from baseline Visual Fields: Left visual field deficit    General Comments  VSS    Pertinent Vitals/ Pain       Faces Pain Scale: Hurts little more Pain Location: L shoulder with activity Pain Descriptors / Indicators: Discomfort;Grimacing;Guarding Pain Intervention(s): Monitored during session                                                          Frequency  Min 2X/week        Progress Toward Goals  OT Goals(current goals can now be found in the care plan section)  Progress towards OT goals: Progressing toward goals  Acute Rehab OT Goals Patient Stated Goal: to go home OT Goal Formulation: With patient/family Time  For Goal Achievement: 12/18/19 Potential to Achieve Goals: Good  Plan Discharge plan remains appropriate    Co-evaluation                 AM-PAC OT "6 Clicks" Daily Activity     Outcome Measure   Help from another person eating meals?: A Little Help from another person taking care of personal grooming?: A Little Help from another person toileting, which includes using toliet, bedpan, or urinal?: A Little Help from another person bathing (including washing, rinsing, drying)?: A Lot Help from another person to put on and taking off regular upper body clothing?: A Little Help from another person to put on and taking off regular lower body clothing?: A Lot 6 Click Score: 16    End of Session Equipment Utilized During Treatment: Gait belt;Rolling walker  OT Visit Diagnosis: Unsteadiness on feet (R26.81);Other abnormalities of gait and mobility (R26.89);Other  symptoms and signs involving cognitive function;Muscle weakness (generalized) (M62.81);History of falling (Z91.81)   Activity Tolerance Patient tolerated treatment well   Patient Left in bed;with family/visitor present;with call bell/phone within reach   Nurse Communication          Time: 1937-9024 OT Time Calculation (min): 18 min  Charges: OT General Charges $OT Visit: 1 Visit OT Treatments $Self Care/Home Management : 8-22 mins  12/05/2019  Rich, OTR/L  Acute Rehabilitation Services  Office:  [phone]    Suzanna Obey 12/05/2019, 2:56 PM

## 2019-12-10 DIAGNOSIS — R82998 Other abnormal findings in urine: Secondary | ICD-10-CM | POA: Diagnosis not present

## 2019-12-10 DIAGNOSIS — Z9889 Other specified postprocedural states: Secondary | ICD-10-CM | POA: Diagnosis not present

## 2019-12-10 DIAGNOSIS — J90 Pleural effusion, not elsewhere classified: Secondary | ICD-10-CM | POA: Diagnosis not present

## 2019-12-10 DIAGNOSIS — E119 Type 2 diabetes mellitus without complications: Secondary | ICD-10-CM | POA: Diagnosis not present

## 2019-12-10 DIAGNOSIS — I10 Essential (primary) hypertension: Secondary | ICD-10-CM | POA: Diagnosis not present

## 2019-12-10 DIAGNOSIS — R32 Unspecified urinary incontinence: Secondary | ICD-10-CM | POA: Diagnosis not present

## 2019-12-10 DIAGNOSIS — I517 Cardiomegaly: Secondary | ICD-10-CM | POA: Diagnosis not present

## 2019-12-10 DIAGNOSIS — R112 Nausea with vomiting, unspecified: Secondary | ICD-10-CM | POA: Diagnosis not present

## 2019-12-10 DIAGNOSIS — R140 Abdominal distension (gaseous): Secondary | ICD-10-CM | POA: Diagnosis not present

## 2019-12-10 DIAGNOSIS — J189 Pneumonia, unspecified organism: Secondary | ICD-10-CM | POA: Diagnosis not present

## 2019-12-10 DIAGNOSIS — M7989 Other specified soft tissue disorders: Secondary | ICD-10-CM | POA: Diagnosis not present

## 2019-12-10 DIAGNOSIS — R1084 Generalized abdominal pain: Secondary | ICD-10-CM | POA: Diagnosis not present

## 2019-12-10 DIAGNOSIS — Z20822 Contact with and (suspected) exposure to covid-19: Secondary | ICD-10-CM | POA: Diagnosis not present

## 2019-12-10 DIAGNOSIS — R5381 Other malaise: Secondary | ICD-10-CM | POA: Diagnosis not present

## 2019-12-10 DIAGNOSIS — I63232 Cerebral infarction due to unspecified occlusion or stenosis of left carotid arteries: Secondary | ICD-10-CM | POA: Diagnosis not present

## 2019-12-10 DIAGNOSIS — J984 Other disorders of lung: Secondary | ICD-10-CM | POA: Diagnosis not present

## 2019-12-10 NOTE — Progress Notes (Signed)
Patient ID: Chelsea [name], female   DOB: January 18, 1945, 74 y.o.   MRN: [id number]   Sw received voicemail from patient daughter. Sw attempted to return call, no answer. Unable to leave VM, due to VM not being set up. SW will attempt again later.   [place], [place] [phone]

## 2019-12-10 NOTE — Progress Notes (Addendum)
Patient set up with [facility] for follow up. SOC begins tomorrow.   Rolling Walker currently on back order with delivery from [employer] Daughter has option to pick up in store at: [address] [place], [place] [address] or wait for delivery.  [place], [place] [phone]

## 2019-12-11 DIAGNOSIS — B9620 Unspecified Escherichia coli [E. coli] as the cause of diseases classified elsewhere: Secondary | ICD-10-CM | POA: Diagnosis not present

## 2019-12-11 DIAGNOSIS — Z48812 Encounter for surgical aftercare following surgery on the circulatory system: Secondary | ICD-10-CM | POA: Diagnosis not present

## 2019-12-11 DIAGNOSIS — E860 Dehydration: Secondary | ICD-10-CM | POA: Diagnosis not present

## 2019-12-11 DIAGNOSIS — Z7902 Long term (current) use of antithrombotics/antiplatelets: Secondary | ICD-10-CM | POA: Diagnosis not present

## 2019-12-11 DIAGNOSIS — E785 Hyperlipidemia, unspecified: Secondary | ICD-10-CM | POA: Diagnosis not present

## 2019-12-11 DIAGNOSIS — K5900 Constipation, unspecified: Secondary | ICD-10-CM | POA: Diagnosis not present

## 2019-12-11 DIAGNOSIS — J189 Pneumonia, unspecified organism: Secondary | ICD-10-CM | POA: Diagnosis not present

## 2019-12-11 DIAGNOSIS — R339 Retention of urine, unspecified: Secondary | ICD-10-CM | POA: Diagnosis not present

## 2019-12-11 DIAGNOSIS — I69321 Dysphasia following cerebral infarction: Secondary | ICD-10-CM | POA: Diagnosis not present

## 2019-12-11 DIAGNOSIS — I10 Essential (primary) hypertension: Secondary | ICD-10-CM | POA: Diagnosis not present

## 2019-12-11 DIAGNOSIS — F17200 Nicotine dependence, unspecified, uncomplicated: Secondary | ICD-10-CM | POA: Diagnosis not present

## 2019-12-11 DIAGNOSIS — E1142 Type 2 diabetes mellitus with diabetic polyneuropathy: Secondary | ICD-10-CM | POA: Diagnosis not present

## 2019-12-11 DIAGNOSIS — N390 Urinary tract infection, site not specified: Secondary | ICD-10-CM | POA: Diagnosis not present

## 2019-12-12 ENCOUNTER — Encounter (HOSPITAL_COMMUNITY): Payer: Self-pay | Admitting: Emergency Medicine

## 2019-12-12 ENCOUNTER — Emergency Department (HOSPITAL_COMMUNITY): Payer: PPO

## 2019-12-12 ENCOUNTER — Emergency Department (HOSPITAL_BASED_OUTPATIENT_CLINIC_OR_DEPARTMENT_OTHER): Payer: PPO

## 2019-12-12 ENCOUNTER — Other Ambulatory Visit: Payer: Self-pay

## 2019-12-12 ENCOUNTER — Emergency Department (HOSPITAL_COMMUNITY)
Admission: EM | Admit: 2019-12-12 | Discharge: 2019-12-12 | Disposition: A | Discharge status: Home / Self Care | Payer: PPO | Attending: Emergency Medicine | Admitting: Emergency Medicine

## 2019-12-12 DIAGNOSIS — I69321 Dysphasia following cerebral infarction: Secondary | ICD-10-CM | POA: Diagnosis not present

## 2019-12-12 DIAGNOSIS — F17200 Nicotine dependence, unspecified, uncomplicated: Secondary | ICD-10-CM | POA: Diagnosis not present

## 2019-12-12 DIAGNOSIS — I10 Essential (primary) hypertension: Secondary | ICD-10-CM | POA: Insufficient documentation

## 2019-12-12 DIAGNOSIS — E1142 Type 2 diabetes mellitus with diabetic polyneuropathy: Secondary | ICD-10-CM | POA: Diagnosis not present

## 2019-12-12 DIAGNOSIS — E860 Dehydration: Secondary | ICD-10-CM | POA: Insufficient documentation

## 2019-12-12 DIAGNOSIS — E119 Type 2 diabetes mellitus without complications: Secondary | ICD-10-CM | POA: Insufficient documentation

## 2019-12-12 DIAGNOSIS — Z48812 Encounter for surgical aftercare following surgery on the circulatory system: Secondary | ICD-10-CM | POA: Diagnosis not present

## 2019-12-12 DIAGNOSIS — J90 Pleural effusion, not elsewhere classified: Secondary | ICD-10-CM | POA: Diagnosis not present

## 2019-12-12 DIAGNOSIS — B9620 Unspecified Escherichia coli [E. coli] as the cause of diseases classified elsewhere: Secondary | ICD-10-CM | POA: Diagnosis not present

## 2019-12-12 DIAGNOSIS — J189 Pneumonia, unspecified organism: Secondary | ICD-10-CM | POA: Insufficient documentation

## 2019-12-12 DIAGNOSIS — M7989 Other specified soft tissue disorders: Secondary | ICD-10-CM

## 2019-12-12 DIAGNOSIS — Z20822 Contact with and (suspected) exposure to covid-19: Secondary | ICD-10-CM | POA: Diagnosis not present

## 2019-12-12 DIAGNOSIS — R339 Retention of urine, unspecified: Secondary | ICD-10-CM | POA: Diagnosis not present

## 2019-12-12 DIAGNOSIS — R52 Pain, unspecified: Secondary | ICD-10-CM

## 2019-12-12 DIAGNOSIS — Z7901 Long term (current) use of anticoagulants: Secondary | ICD-10-CM | POA: Insufficient documentation

## 2019-12-12 DIAGNOSIS — I63531 Cerebral infarction due to unspecified occlusion or stenosis of right posterior cerebral artery: Secondary | ICD-10-CM | POA: Diagnosis not present

## 2019-12-12 DIAGNOSIS — R2241 Localized swelling, mass and lump, right lower limb: Secondary | ICD-10-CM | POA: Insufficient documentation

## 2019-12-12 DIAGNOSIS — F17210 Nicotine dependence, cigarettes, uncomplicated: Secondary | ICD-10-CM | POA: Diagnosis not present

## 2019-12-12 DIAGNOSIS — N390 Urinary tract infection, site not specified: Secondary | ICD-10-CM | POA: Diagnosis not present

## 2019-12-12 DIAGNOSIS — J9811 Atelectasis: Secondary | ICD-10-CM | POA: Diagnosis not present

## 2019-12-12 DIAGNOSIS — R1110 Vomiting, unspecified: Secondary | ICD-10-CM | POA: Diagnosis present

## 2019-12-12 DIAGNOSIS — Z79899 Other long term (current) drug therapy: Secondary | ICD-10-CM | POA: Insufficient documentation

## 2019-12-12 DIAGNOSIS — Z7902 Long term (current) use of antithrombotics/antiplatelets: Secondary | ICD-10-CM | POA: Diagnosis not present

## 2019-12-12 DIAGNOSIS — K5900 Constipation, unspecified: Secondary | ICD-10-CM | POA: Diagnosis not present

## 2019-12-12 DIAGNOSIS — E785 Hyperlipidemia, unspecified: Secondary | ICD-10-CM | POA: Diagnosis not present

## 2019-12-12 LAB — CBC WITH DIFFERENTIAL/PLATELET
Abs Immature Granulocytes: 0.06 10*3/uL (ref 0.00–0.07)
Basophils Absolute: 0.1 10*3/uL (ref 0.0–0.1)
Basophils Relative: 1 %
Eosinophils Absolute: 0.5 10*3/uL (ref 0.0–0.5)
Eosinophils Relative: 4 %
HCT: 34.1 % — ABNORMAL LOW (ref 36.0–46.0)
Hemoglobin: 10.7 g/dL — ABNORMAL LOW (ref 12.0–15.0)
Immature Granulocytes: 0 %
Lymphocytes Relative: 14 %
Lymphs Abs: 1.9 10*3/uL (ref 0.7–4.0)
MCH: 30.7 pg (ref 26.0–34.0)
MCHC: 31.4 g/dL (ref 30.0–36.0)
MCV: 98 fL (ref 80.0–100.0)
Monocytes Absolute: 0.7 10*3/uL (ref 0.1–1.0)
Monocytes Relative: 5 %
Neutro Abs: 10.1 10*3/uL — ABNORMAL HIGH (ref 1.7–7.7)
Neutrophils Relative %: 76 %
Platelets: 349 10*3/uL (ref 150–400)
RBC: 3.48 MIL/uL — ABNORMAL LOW (ref 3.87–5.11)
RDW: 12.8 % (ref 11.5–15.5)
WBC: 13.3 10*3/uL — ABNORMAL HIGH (ref 4.0–10.5)
nRBC: 0 % (ref 0.0–0.2)

## 2019-12-12 LAB — CBC
HCT: 32.9 % — ABNORMAL LOW (ref 36.0–46.0)
Hemoglobin: 10.8 g/dL — ABNORMAL LOW (ref 12.0–15.0)
MCH: 31.5 pg (ref 26.0–34.0)
MCHC: 32.8 g/dL (ref 30.0–36.0)
MCV: 95.9 fL (ref 80.0–100.0)
Platelets: 350 10*3/uL (ref 150–400)
RBC: 3.43 MIL/uL — ABNORMAL LOW (ref 3.87–5.11)
RDW: 12.8 % (ref 11.5–15.5)
WBC: 13.2 10*3/uL — ABNORMAL HIGH (ref 4.0–10.5)
nRBC: 0 % (ref 0.0–0.2)

## 2019-12-12 LAB — RESP PANEL BY RT-PCR (FLU A&B, COVID) ARPGX2
Influenza A by PCR: NEGATIVE
Influenza B by PCR: NEGATIVE
SARS Coronavirus 2 by RT PCR: NEGATIVE

## 2019-12-12 LAB — COMPREHENSIVE METABOLIC PANEL
ALT: 21 U/L (ref 0–44)
AST: 20 U/L (ref 15–41)
Albumin: 3.2 g/dL — ABNORMAL LOW (ref 3.5–5.0)
Alkaline Phosphatase: 134 U/L — ABNORMAL HIGH (ref 38–126)
Anion gap: 12 (ref 5–15)
BUN: 15 mg/dL (ref 8–23)
CO2: 26 mmol/L (ref 22–32)
Calcium: 10.3 mg/dL (ref 8.9–10.3)
Chloride: 101 mmol/L (ref 98–111)
Creatinine, Ser: 1.41 mg/dL — ABNORMAL HIGH (ref 0.44–1.00)
GFR, Estimated: 39 mL/min — ABNORMAL LOW (ref >60)
Glucose, Bld: 163 mg/dL — ABNORMAL HIGH (ref 70–99)
Potassium: 4.6 mmol/L (ref 3.5–5.1)
Sodium: 139 mmol/L (ref 135–145)
Total Bilirubin: 0.8 mg/dL (ref 0.3–1.2)
Total Protein: 6.1 g/dL — ABNORMAL LOW (ref 6.5–8.1)

## 2019-12-12 LAB — LIPASE, BLOOD: Lipase: 21 U/L (ref 11–51)

## 2019-12-12 LAB — LACTIC ACID, PLASMA: Lactic Acid, Venous: 1.4 mmol/L (ref 0.5–1.9)

## 2019-12-12 MED ORDER — LEVOFLOXACIN 500 MG PO TABS
500.0000 mg | ORAL_TABLET | Freq: Once | ORAL | Status: AC
  Administered 2019-12-12: 500 mg via ORAL
  Filled 2019-12-12: qty 1

## 2019-12-12 MED ORDER — ONDANSETRON HCL 4 MG/2ML IJ SOLN
4.0000 mg | Freq: Once | INTRAMUSCULAR | Status: AC
  Administered 2019-12-12: 4 mg via INTRAVENOUS
  Filled 2019-12-12: qty 2

## 2019-12-12 MED ORDER — SODIUM CHLORIDE 0.9 % IV BOLUS
500.0000 mL | Freq: Once | INTRAVENOUS | Status: AC
  Administered 2019-12-12: 500 mL via INTRAVENOUS

## 2019-12-12 NOTE — Discharge Summary (Signed)
[facility] Discharge Summary   Patient ID:  Chelsea [name] MRN: [id number] DOB/AGE: 06-01-1945 74 y.o.  Admit date: 12/03/2019 Discharge date: 12/05/2019 Date of Surgery: 12/03/2019 Surgeon: Surgeon(s): Angelia Mould, MD  Admission Diagnosis: Left carotid artery stenosis [I65.22]  Discharge Diagnoses:  Left carotid artery stenosis [I65.22]  Secondary Diagnoses: Past Medical History:  Diagnosis Date  . Blind left eye   . Cardiac arrest ([place])   . Diabetes mellitus without complication ([place])   . Hypertension   . Stroke [facility])     Procedure(s): LEFT CAROTID ENDARTERECTOMY PATCH ANGIOPLASTY Left Carotid Artery  Discharged Condition: stable  HPI: 74 y/o female with left ICA stenosis was originally seen by Dr. Donnetta Hutching who recommended left carotid endarterectomy.  Dr. Scot Dock reviewed the the CT scan and recommended left carotid endarterectomy.     Hospital Course:  [name] is a 74 y.o. female is S/P  Procedure(s): LEFT CAROTID ENDARTERECTOMY PATCH ANGIOPLASTY Left Carotid Artery No focal weakness or paresthesias. Her left neck incision looks fine.  HH PT was recommended.  Plan aspirin and Plavix x3 months then Plavix alone She denied symptoms of stroke.   Significant Diagnostic Studies: CBC Lab Results  Component Value Date   WBC 13.2 (H) 12/12/2019   HGB 10.8 (L) 12/12/2019   HCT 32.9 (L) 12/12/2019   MCV 95.9 12/12/2019   PLT 350 12/12/2019    BMET    Component Value Date/Time   NA 139 12/12/2019 0811   K 4.6 12/12/2019 0811   CL 101 12/12/2019 0811   CO2 26 12/12/2019 0811   GLUCOSE 163 (H) 12/12/2019 0811   BUN 15 12/12/2019 0811   CREATININE 1.41 (H) 12/12/2019 0811   CALCIUM 10.3 12/12/2019 0811   GFRNONAA 39 (L) 12/12/2019 0811   GFRAA  09/17/2008 1612    >60        The eGFR has been calculated using the MDRD equation. This calculation has not been validated in all clinical situations. eGFR's  persistently <60 mL/min signify possible Chronic Kidney Disease.   COAG Lab Results  Component Value Date   INR 1.0 11/08/2019     Disposition:  Discharge to :Home Discharge Instructions    Call MD for:  redness, tenderness, or signs of infection (pain, swelling, bleeding, redness, odor or green/yellow discharge around incision site)   Complete by: As directed    Call MD for:  severe or increased pain, loss or decreased feeling  in affected limb(s)   Complete by: As directed    Call MD for:  temperature >100.5   Complete by: As directed      Allergies as of 12/05/2019      Reactions   Codeine Anaphylaxis, Swelling   Throat swells    Penicillins Anaphylaxis, Swelling   Throat closes    Sulfa Antibiotics Anaphylaxis, Swelling, Other (See Comments)   Lips and eye swell   Benzodiazepines Hives   Lidocaine Hives   Gabapentin Nausea And Vomiting      Medication List    STOP taking these medications   insulin aspart 100 UNIT/ML injection Commonly known as: novoLOG   vancomycin 1250 MG/250ML Soln Commonly known as: VANCOREADY     TAKE these medications   acetaminophen 325 MG tablet Commonly known as: TYLENOL Take 2 tablets (650 mg total) by mouth every 6 (six) hours as needed for mild pain (or Fever >/= 101).   amLODipine 10 MG tablet Commonly known as: NORVASC Take 1 tablet (10 mg total)  by mouth daily.   artificial tears Oint ophthalmic ointment Commonly known as: LACRILUBE Place into the left eye at bedtime as needed for dry eyes.   ascorbic acid 500 MG tablet Commonly known as: VITAMIN C Take 1 tablet (500 mg total) by mouth daily.   aspirin EC 81 MG tablet Take 1 tablet (81 mg total) by mouth daily. Swallow whole.   atorvastatin 80 MG tablet Commonly known as: LIPITOR Take 1 tablet (80 mg total) by mouth daily.   CAL-MAG-ZINC PO Take 1 tablet by mouth daily.   carvedilol 3.125 MG tablet Commonly known as: COREG Take 1 tablet (3.125 mg total) by  mouth daily.   clopidogrel 75 MG tablet Commonly known as: PLAVIX Take 1 tablet (75 mg total) by mouth daily.   hydroxypropyl methylcellulose / hypromellose 2.5 % ophthalmic solution Commonly known as: ISOPTO TEARS / GONIOVISC Place 1 drop into the left eye 3 (three) times daily.   lisinopril 2.5 MG tablet Commonly known as: ZESTRIL Take 1 tablet (2.5 mg total) by mouth daily.   metFORMIN 500 MG tablet Commonly known as: GLUCOPHAGE Take 1 tablet (500 mg total) by mouth 2 (two) times daily with a meal.   MULTI-VITAMIN DAILY PO Take by mouth.   polyethylene glycol 17 g packet Commonly known as: MIRALAX / GLYCOLAX Take 17 g by mouth daily.   pregabalin 100 MG capsule Commonly known as: LYRICA Take 1 capsule (100 mg total) by mouth every evening.   scopolamine 1 MG/3DAYS Commonly known as: TRANSDERM-SCOP Place 1 patch (1.5 mg total) onto the skin every 3 (three) days. Starting Friday morning What changed: additional instructions   terazosin 2 MG capsule Commonly known as: HYTRIN Take 1 capsule (2 mg total) by mouth at bedtime.   traMADol 50 MG tablet Commonly known as: Ultram Take 1 tablet (50 mg total) by mouth every 12 (twelve) hours as needed.      Verbal and written Discharge instructions given to the patient. Wound care per Discharge AVS  Follow-up Information    Angelia Mould, MD Follow up in 3 week(s).   Specialties: Vascular Surgery, Cardiology Contact information: [address] [place] [address] [phone]               Signed: Roxy Horseman 12/12/2019, 9:59 AM --- For VQI Registry use --- Instructions: Press F2 to tab through selections.  Delete question if not applicable.   Modified Rankin score at D/C (0-6): Rankin Score=0  IV medication needed for:  1. Hypertension: No 2. Hypotension: No  Post-op Complications: No  1. Post-op CVA or TIA: No  If yes: Event classification (right eye, left eye, right cortical, left  cortical, verterobasilar, other):   If yes: Timing of event (intra-op, <6 hrs post-op, >=6 hrs post-op, unknown):   2. CN injury: No  If yes: CN  injuried   3. Myocardial infarction: No  If yes: Dx by (EKG or clinical, Troponin):   4.  CHF: No  5.  Dysrhythmia (new): No  6. Wound infection: No  7. Reperfusion symptoms: No  8. Return to OR: No  If yes: return to OR for (bleeding, neurologic, other CEA incision, other):   Discharge medications: Statin use:  Yes ASA use:  Yes Beta blocker use:  Yes ACE-Inhibitor use:  Yes P2Y12 Antagonist use: [ ]  None, [x ] Plavix, [ ]  Plasugrel, [ ]  Ticlopinine, [ ]  Ticagrelor, [ ]  Other, [ ]  No for medical reason, [ ]  Non-compliant, [ ]  Not-indicated  Anti-coagulant use:  [x ] None, [ ]  Warfarin, [ ]  Rivaroxaban, [ ]  Dabigatran, [ ]  Other, [ ]  No for medical reason, [ ]  Non-compliant, [ ]  Not-indicated

## 2019-12-12 NOTE — ED Provider Notes (Signed)
[facility] EMERGENCY DEPARTMENT Provider Note   CSN: [id number] Arrival date & time: 12/12/19  2725     History No chief complaint on file.   Chelsea [name] is a 74 y.o. female.  74 year old female with history of diabetes, HTN, CVA, cardiac arrest, recently discharged 12/03/19 after nearly a month long admission for CVA. Patient reports cough with a rattle in her chest onset Monday (2 days ago), seen at [facility] with CXR diagnosing PNA and started on Levaquin. Patient states she has had abdominal distention with nausea, vomiting, diarrhea for the past month, is unable to keep the Levaquin or any of her other medications down. No improvement in vomiting with Zofran ODT and scopolamine patch. Denies chest pain, abdominal pain, blood in stools or emesis. Also reports right lower extremity swelling for the past week, not improving with elevation of leg at home.         Past Medical History:  Diagnosis Date  . Blind left eye   . Cardiac arrest (HCC)   . Diabetes mellitus without complication (HCC)   . Hypertension   . Stroke [facility])     Patient Active Problem List   Diagnosis Date Noted  . Left carotid artery stenosis 12/03/2019  . Stroke (HCC)   . Pain   . Acute ischemic right posterior cerebral artery (PCA) stroke (HCC) 11/09/2019  . Primary hypertension   . Controlled type 2 diabetes mellitus with hyperglycemia (HCC)   . Postherpetic neuralgia   . Uncontrolled type 2 diabetes mellitus with hyperglycemia, without long-term current use of insulin (HCC) 11/07/2019  . Hypertensive urgency 11/07/2019  . Stenosis of left internal carotid artery 11/07/2019  . Intractable nausea and vomiting 11/07/2019  . Nicotine dependence, cigarettes, uncomplicated 11/07/2019  . HZV (herpes zoster virus) post herpetic neuralgia 11/07/2019  . Weakness of right lower extremity 11/07/2019  . CVA (cerebral vascular accident) (HCC) 11/07/2019    Past Surgical History:  Procedure  Laterality Date  . ENDARTERECTOMY Left 12/03/2019   Procedure: LEFT CAROTID ENDARTERECTOMY;  Surgeon: Chuck Hint, MD;  Location: [facility] OR;  Service: Vascular;  Laterality: Left;  . PATCH ANGIOPLASTY Left 12/03/2019   Procedure: PATCH ANGIOPLASTY Left Carotid Artery;  Surgeon: Chuck Hint, MD;  Location: [facility] OR;  Service: Vascular;  Laterality: Left;     OB History   No obstetric history on file.     Family History  Problem Relation Age of Onset  . Heart attack Mother   . Stroke Father     Social History   Tobacco Use  . Smoking status: Current Every Day Smoker  . Smokeless tobacco: Never Used  Substance Use Topics  . Alcohol use: Not Currently  . Drug use: Never    Home Medications Prior to Admission medications   Medication Sig Start Date End Date Taking? Authorizing Provider  acetaminophen (TYLENOL) 325 MG tablet Take 2 tablets (650 mg total) by mouth every 6 (six) hours as needed for mild pain (or Fever >/= 101). 12/03/19  Yes Angiulli, Mcarthur Rossetti, PA-C  amLODipine (NORVASC) 10 MG tablet Take 1 tablet (10 mg total) by mouth daily. 12/05/19  Yes Love, Evlyn Kanner, PA-C  artificial tears (LACRILUBE) OINT ophthalmic ointment Place into the left eye at bedtime as needed for dry eyes. 12/03/19  Yes Angiulli, Mcarthur Rossetti, PA-C  ascorbic acid (VITAMIN C) 500 MG tablet Take 1 tablet (500 mg total) by mouth daily. 12/05/19  Yes Love, Evlyn Kanner, PA-C  aspirin EC  81 MG tablet Take 1 tablet (81 mg total) by mouth daily. Swallow whole. 12/05/19  Yes Love, Evlyn KannerPamela S, PA-C  atorvastatin (LIPITOR) 80 MG tablet Take 1 tablet (80 mg total) by mouth daily. 12/05/19  Yes Love, Evlyn KannerPamela S, PA-C  Calcium-Magnesium-Zinc (CAL-MAG-ZINC PO) Take 1 tablet by mouth daily.   Yes [provider]  carvedilol (COREG) 3.125 MG tablet Take 1 tablet (3.125 mg total) by mouth daily. 12/05/19  Yes Love, Evlyn KannerPamela S, PA-C  clopidogrel (PLAVIX) 75 MG tablet Take 1 tablet (75 mg total) by mouth daily.  12/05/19  Yes Love, Evlyn KannerPamela S, PA-C  hydroxypropyl methylcellulose / hypromellose (ISOPTO TEARS / GONIOVISC) 2.5 % ophthalmic solution Place 1 drop into the left eye 3 (three) times daily. 12/03/19  Yes Angiulli, Mcarthur Rossettianiel J, PA-C  levofloxacin (LEVAQUIN) 750 MG tablet Take 750 mg by mouth daily. For 7 days 12/10/19 12/17/19 Yes [provider]  lisinopril (ZESTRIL) 2.5 MG tablet Take 1 tablet (2.5 mg total) by mouth daily. 12/05/19  Yes Love, Evlyn KannerPamela S, PA-C  metFORMIN (GLUCOPHAGE) 500 MG tablet Take 1 tablet (500 mg total) by mouth 2 (two) times daily with a meal. 12/05/19  Yes Love, Evlyn KannerPamela S, PA-C  Multiple Vitamin (MULTI-VITAMIN DAILY PO) Take 1 tablet by mouth daily.    Yes [provider]  ondansetron (ZOFRAN-ODT) 4 MG disintegrating tablet Take 4 mg by mouth every 8 (eight) hours as needed for nausea/vomiting. 12/10/19  Yes [provider]  pantoprazole (PROTONIX) 40 MG tablet Take 40 mg by mouth daily. 12/10/19  Yes [provider]  polyethylene glycol (MIRALAX / GLYCOLAX) 17 g packet Take 17 g by mouth daily. 12/05/19  Yes Love, Evlyn KannerPamela S, PA-C  pregabalin (LYRICA) 100 MG capsule Take 1 capsule (100 mg total) by mouth every evening. 12/05/19  Yes Love, Evlyn KannerPamela S, PA-C  scopolamine (TRANSDERM-SCOP) 1 MG/3DAYS Place 1 patch (1.5 mg total) onto the skin every 3 (three) days. Starting Friday morning 12/05/19  Yes Love, Evlyn Kanneramela S, PA-C  terazosin (HYTRIN) 2 MG capsule Take 1 capsule (2 mg total) by mouth at bedtime. 12/05/19  Yes Love, Evlyn KannerPamela S, PA-C  traMADol (ULTRAM) 50 MG tablet Take 1 tablet (50 mg total) by mouth every 12 (twelve) hours as needed. Patient taking differently: Take 50 mg by mouth every 12 (twelve) hours as needed for moderate pain.  12/05/19 12/04/20 Yes Lars Mageollins, Emma M, PA-C    Allergies    Codeine, Penicillins, Sulfa antibiotics, Benzodiazepines, Lidocaine, and Gabapentin  Review of Systems   Review of Systems  Constitutional: Negative for  chills and fever.  HENT: Negative for congestion.   Respiratory: Positive for cough. Negative for shortness of breath.   Cardiovascular: Positive for leg swelling. Negative for chest pain.  Gastrointestinal: Positive for abdominal distention, diarrhea, nausea and vomiting. Negative for abdominal pain, blood in stool and constipation.  Genitourinary: Negative for dysuria.  Musculoskeletal: Negative for arthralgias and myalgias.  Skin: Negative for rash and wound.  Allergic/Immunologic: Positive for immunocompromised state.  Neurological: Positive for weakness.  Psychiatric/Behavioral: Negative for confusion.  All other systems reviewed and are negative.   Physical Exam Updated Vital Signs BP (!) 149/56   Pulse 69   Temp 98.6 F (37 C) (Oral)   Resp (!) 22   SpO2 95%   Physical Exam Vitals and nursing note reviewed.  Constitutional:      General: She is not in acute distress.    Appearance: She is well-developed. She is not diaphoretic.  Comments: Chronically ill appearing   HENT:     Head: Normocephalic and atraumatic.     Mouth/Throat:     Mouth: Mucous membranes are dry.  Eyes:     Conjunctiva/sclera: Conjunctivae normal.  Cardiovascular:     Rate and Rhythm: Normal rate and regular rhythm.     Pulses: Normal pulses.     Heart sounds: Normal heart sounds.  Pulmonary:     Effort: Pulmonary effort is normal.     Breath sounds: Normal breath sounds.  Abdominal:     Palpations: Abdomen is soft.     Tenderness: There is no abdominal tenderness.  Musculoskeletal:        General: No tenderness.     Cervical back: Neck supple.     Right lower leg: Edema present.     Left lower leg: No edema.  Skin:    General: Skin is warm and dry.     Findings: No erythema or rash.  Neurological:     Mental Status: She is alert and oriented to person, place, and time.  Psychiatric:        Behavior: Behavior normal.     ED Results / Procedures / Treatments   Labs (all labs  ordered are listed, but only abnormal results are displayed) Labs Reviewed  COMPREHENSIVE METABOLIC PANEL - Abnormal; Notable for the following components:      Result Value   Glucose, Bld 163 (*)    Creatinine, Ser 1.41 (*)    Total Protein 6.1 (*)    Albumin 3.2 (*)    Alkaline Phosphatase 134 (*)    GFR, Estimated 39 (*)    All other components within normal limits  CBC - Abnormal; Notable for the following components:   WBC 13.2 (*)    RBC 3.43 (*)    Hemoglobin 10.8 (*)    HCT 32.9 (*)    All other components within normal limits  CBC WITH DIFFERENTIAL/PLATELET - Abnormal; Notable for the following components:   WBC 13.3 (*)    RBC 3.48 (*)    Hemoglobin 10.7 (*)    HCT 34.1 (*)    Neutro Abs 10.1 (*)    All other components within normal limits  RESP PANEL BY RT-PCR (FLU A&B, COVID) ARPGX2  CULTURE, BLOOD (ROUTINE X 2)  CULTURE, BLOOD (ROUTINE X 2)  GASTROINTESTINAL PANEL BY PCR, STOOL (REPLACES STOOL CULTURE)  C DIFFICILE (CDIFF) QUICK SCRN (NO PCR REFLEX)  LIPASE, BLOOD  LACTIC ACID, PLASMA  URINALYSIS, ROUTINE W REFLEX MICROSCOPIC    EKG None  Radiology DG Chest 2 View  Result Date: 12/12/2019 CLINICAL DATA:  Pneumonia. EXAM: CHEST - 2 VIEW COMPARISON:  None. FINDINGS: The heart size and mediastinal contours are within normal limits. No pneumothorax is noted. Left lung is clear. Small right pleural effusion is noted with associated right basilar atelectasis. The visualized skeletal structures are unremarkable. IMPRESSION: Small right pleural effusion with associated right basilar atelectasis. Electronically Signed   By: Lupita Raider M.D.   On: 12/12/2019 08:23   VAS [place] LOWER EXTREMITY VENOUS (DVT) (ONLY [facility] & [facility] 7a-7p)  Result Date: 12/12/2019  Lower Venous DVT Study Indications: Pain, and Swelling. Other Indications: Status post Left CEA 11/21. Comparison Study: No previous Performing Technologist: Marilynne Halsted RDMS, RVT  Examination Guidelines: A complete  evaluation includes B-mode imaging, spectral Doppler, color Doppler, and power Doppler as needed of all accessible portions of each vessel. Bilateral testing is considered an integral part of a complete  examination. Limited examinations for reoccurring indications may be performed as noted. The reflux portion of the exam is performed with the patient in reverse Trendelenburg.  +---------+---------------+---------+-----------+----------+--------------+ RIGHT    CompressibilityPhasicitySpontaneityPropertiesThrombus Aging +---------+---------------+---------+-----------+----------+--------------+ CFV      Full           Yes      Yes                                 +---------+---------------+---------+-----------+----------+--------------+ SFJ      Full                                                        +---------+---------------+---------+-----------+----------+--------------+ FV Prox  Full                                                        +---------+---------------+---------+-----------+----------+--------------+ FV Mid   Full                                                        +---------+---------------+---------+-----------+----------+--------------+ FV DistalFull                                                        +---------+---------------+---------+-----------+----------+--------------+ PFV      Full                                                        +---------+---------------+---------+-----------+----------+--------------+ POP      Full           Yes      Yes                                 +---------+---------------+---------+-----------+----------+--------------+ PTV      Full                                                        +---------+---------------+---------+-----------+----------+--------------+ PERO     Full                                                         +---------+---------------+---------+-----------+----------+--------------+   +----+---------------+---------+-----------+----------+--------------+ LEFTCompressibilityPhasicitySpontaneityPropertiesThrombus Aging +----+---------------+---------+-----------+----------+--------------+ CFV Full           Yes  Yes                                 +----+---------------+---------+-----------+----------+--------------+     Summary: RIGHT: - There is no evidence of deep vein thrombosis in the lower extremity.  - No cystic structure found in the popliteal fossa.  LEFT: - No evidence of common femoral vein obstruction.  *See table(s) above for measurements and observations.    Preliminary     Procedures Procedures (including critical care time)  Medications Ordered in ED Medications  ondansetron (ZOFRAN) injection 4 mg (has no administration in time range)  sodium chloride 0.9 % bolus 500 mL (0 mLs Intravenous Stopped 12/12/19 1213)  sodium chloride 0.9 % bolus 500 mL (0 mLs Intravenous Stopped 12/12/19 1433)  levofloxacin (LEVAQUIN) tablet 500 mg (500 mg Oral Given 12/12/19 1441)    ED Course  I have reviewed the triage vital signs and the nursing notes.  Pertinent labs & imaging results that were available during my care of the patient were reviewed by me and considered in my medical decision making (see chart for details).  Clinical Course as of Dec 11 1504  Wed Dec 12, 2019  4070 74 year old female presents from home with on going nausea, vomiting, diarrhea. Recently diagnosed with PNA, unable to keep antibiotics or regular meds down despite taking Zofran and scopolamine patch.  On exam, lungs clear although does have an occasional deep non productive cough, abdomen is soft and non tender. Right leg swollen compared to left. CXR with right lower effusion. Doppler study negative for right lower extremity DVT. Labs with slightly increased WBC to 13.3 compared to prior. CMP with slight  increase in Cr to 1.4 with GFR decreased to 39- given IV fluids. Lactic acid normal at 1.2, COVID/flu negative. Discussed with Dr. Deretha Emory, ER attending, plan is to PO challenge, if able to keep down liquids/abx, may dc home. Patient prefers to go home today, states she has not been nauseous all day and is ready to PO challenge.   [LM]  1505 Patient is tolerating small sips of water, plan is to discharge if she is able to keep her Levaquin down.  If unable to keep her Levaquin down, may need admission for intractable vomiting.   [LM]    Clinical Course User Index [LM] Alden Hipp   MDM Rules/Calculators/A&P                          Final Clinical Impression(s) / ED Diagnoses Final diagnoses:  Healthcare-associated pneumonia  Dehydration    Rx / DC Orders ED Discharge Orders    None       Jeannie Fend, PA-C 12/12/19 1506    Vanetta Mulders, MD 12/12/19 [id number]

## 2019-12-12 NOTE — ED Triage Notes (Signed)
Pt here from home with c/o n/v//d and abd distention , pt is on Levaquin po for treatment of pna but has been unable to keep the meds down home care offered iv treatment but primary Md wanted pt to come to [facility] be evaluated

## 2019-12-12 NOTE — ED Notes (Signed)
Reviewed discharge instructions with patient and daughter. Follow-up care and antibiotics reviewed. Patient and daughter verbalized understanding. Patient A&Ox4, VSS upon discharge.

## 2019-12-12 NOTE — Progress Notes (Signed)
Right lower extremity  has been completed. Refer to [facility] under chart review to view preliminary results.   12/12/2019  10:42 AM Analiz Tvedt, Gerarda Gunther

## 2019-12-12 NOTE — Discharge Instructions (Signed)
Take antibiotics as prescribed and complete the full course. Recheck with your doctor.  Return to the ER as needed for worsening or concerning symptoms.

## 2019-12-17 ENCOUNTER — Telehealth: Payer: Self-pay

## 2019-12-17 LAB — CULTURE, BLOOD (ROUTINE X 2)
Culture: NO GROWTH
Culture: NO GROWTH

## 2019-12-17 NOTE — Telephone Encounter (Signed)
Patient's daughter called, patient has swelling in bilateral feet. Denies pain or discoloration. Advised to elevate legs, and try compression socks. Verbalized understanding.

## 2019-12-26 DIAGNOSIS — E860 Dehydration: Secondary | ICD-10-CM | POA: Diagnosis not present

## 2019-12-26 DIAGNOSIS — E119 Type 2 diabetes mellitus without complications: Secondary | ICD-10-CM | POA: Diagnosis not present

## 2020-01-04 DIAGNOSIS — I63531 Cerebral infarction due to unspecified occlusion or stenosis of right posterior cerebral artery: Secondary | ICD-10-CM | POA: Diagnosis not present

## 2020-01-09 DIAGNOSIS — I10 Essential (primary) hypertension: Secondary | ICD-10-CM | POA: Diagnosis not present

## 2020-01-09 DIAGNOSIS — K5900 Constipation, unspecified: Secondary | ICD-10-CM | POA: Diagnosis not present

## 2020-01-09 DIAGNOSIS — R112 Nausea with vomiting, unspecified: Secondary | ICD-10-CM | POA: Diagnosis not present

## 2020-01-09 DIAGNOSIS — J189 Pneumonia, unspecified organism: Secondary | ICD-10-CM | POA: Diagnosis not present

## 2020-01-09 DIAGNOSIS — E119 Type 2 diabetes mellitus without complications: Secondary | ICD-10-CM | POA: Diagnosis not present

## 2020-01-09 DIAGNOSIS — D72819 Decreased white blood cell count, unspecified: Secondary | ICD-10-CM | POA: Diagnosis not present

## 2020-01-09 DIAGNOSIS — M7989 Other specified soft tissue disorders: Secondary | ICD-10-CM | POA: Diagnosis not present

## 2020-01-09 DIAGNOSIS — I639 Cerebral infarction, unspecified: Secondary | ICD-10-CM | POA: Diagnosis not present

## 2020-01-09 DIAGNOSIS — R8290 Unspecified abnormal findings in urine: Secondary | ICD-10-CM | POA: Diagnosis not present

## 2020-01-12 DIAGNOSIS — J189 Pneumonia, unspecified organism: Secondary | ICD-10-CM | POA: Diagnosis not present

## 2020-01-12 DIAGNOSIS — E785 Hyperlipidemia, unspecified: Secondary | ICD-10-CM | POA: Diagnosis not present

## 2020-01-12 DIAGNOSIS — R339 Retention of urine, unspecified: Secondary | ICD-10-CM | POA: Diagnosis not present

## 2020-01-12 DIAGNOSIS — B9620 Unspecified Escherichia coli [E. coli] as the cause of diseases classified elsewhere: Secondary | ICD-10-CM | POA: Diagnosis not present

## 2020-01-12 DIAGNOSIS — Z7902 Long term (current) use of antithrombotics/antiplatelets: Secondary | ICD-10-CM | POA: Diagnosis not present

## 2020-01-12 DIAGNOSIS — E1142 Type 2 diabetes mellitus with diabetic polyneuropathy: Secondary | ICD-10-CM | POA: Diagnosis not present

## 2020-01-12 DIAGNOSIS — F17200 Nicotine dependence, unspecified, uncomplicated: Secondary | ICD-10-CM | POA: Diagnosis not present

## 2020-01-12 DIAGNOSIS — N390 Urinary tract infection, site not specified: Secondary | ICD-10-CM | POA: Diagnosis not present

## 2020-01-12 DIAGNOSIS — E860 Dehydration: Secondary | ICD-10-CM | POA: Diagnosis not present

## 2020-01-12 DIAGNOSIS — Z48812 Encounter for surgical aftercare following surgery on the circulatory system: Secondary | ICD-10-CM | POA: Diagnosis not present

## 2020-01-12 DIAGNOSIS — I69321 Dysphasia following cerebral infarction: Secondary | ICD-10-CM | POA: Diagnosis not present

## 2020-01-12 DIAGNOSIS — K5900 Constipation, unspecified: Secondary | ICD-10-CM | POA: Diagnosis not present

## 2020-01-12 DIAGNOSIS — I10 Essential (primary) hypertension: Secondary | ICD-10-CM | POA: Diagnosis not present

## 2020-01-15 DIAGNOSIS — J189 Pneumonia, unspecified organism: Secondary | ICD-10-CM | POA: Diagnosis not present

## 2020-01-16 ENCOUNTER — Ambulatory Visit (INDEPENDENT_AMBULATORY_CARE_PROVIDER_SITE_OTHER): Payer: PPO | Admitting: Vascular Surgery

## 2020-01-16 ENCOUNTER — Encounter: Payer: Self-pay | Admitting: Vascular Surgery

## 2020-01-16 ENCOUNTER — Other Ambulatory Visit: Payer: Self-pay

## 2020-01-16 VITALS — BP 158/75 | HR 63 | Temp 97.9°F | Resp 20 | Ht 67.0 in | Wt 158.0 lb

## 2020-01-16 DIAGNOSIS — I6522 Occlusion and stenosis of left carotid artery: Secondary | ICD-10-CM

## 2020-01-16 NOTE — Progress Notes (Addendum)
Patient name: Chelsea [name] MRN: [id number] DOB: [birth year] Sex: female  REASON FOR VISIT:   Follow-up after left carotid endarterectomy  HPI:   Chelsea [name] is a pleasant 75 y.o. female who was seen in consultation with a greater than 80% left carotid stenosis and no significant stenosis on the right.  The patient had presented with a posterior circulation stroke.  Thus, it was felt that this stenosis was asymptomatic.  However, given the severity of the stenosis left carotid endarterectomy was recommended in order to lower her risk of future stroke.  She underwent left carotid endarterectomy with bovine pericardial patch angioplasty on 12/03/2019.  She did well postoperatively and was discharged on postoperative day #2 with home health physical therapy.  She was on aspirin and was on a statin.  She comes in for an outpatient visit.  Since I saw her in the hospital she has been getting outpatient physical therapy and doing well from that standpoint.  Her biggest complaint is that she feels like she is on too many medications.  She is on 5 medications for blood pressure and states that when she takes all of her medicine she "tanks out."  She has had issues with low energy and bradycardia.  I have encouraged her to follow-up with her primary care physician concerning this.   Current Outpatient Medications  Medication Sig Dispense Refill  . acetaminophen (TYLENOL) 325 MG tablet Take 2 tablets (650 mg total) by mouth every 6 (six) hours as needed for mild pain (or Fever >/= 101).    Marland Kitchen amLODipine (NORVASC) 10 MG tablet Take 1 tablet (10 mg total) by mouth daily. 30 tablet 0  . artificial tears (LACRILUBE) OINT ophthalmic ointment Place into the left eye at bedtime as needed for dry eyes.    Marland Kitchen ascorbic acid (VITAMIN C) 500 MG tablet Take 1 tablet (500 mg total) by mouth daily. 30 tablet 0  . aspirin EC 81 MG tablet Take 1 tablet (81 mg total) by mouth daily. Swallow whole. (Patient taking  differently: Take 81 mg by mouth daily. Swallow whole.) 30 tablet 11  . atorvastatin (LIPITOR) 80 MG tablet Take 1 tablet (80 mg total) by mouth daily. 30 tablet 0  . Calcium-Magnesium-Zinc (CAL-MAG-ZINC PO) Take 1 tablet by mouth daily.    . carvedilol (COREG) 3.125 MG tablet Take 1 tablet (3.125 mg total) by mouth daily. 60 tablet 0  . clopidogrel (PLAVIX) 75 MG tablet Take 1 tablet (75 mg total) by mouth daily. 30 tablet 0  . hydroxypropyl methylcellulose / hypromellose (ISOPTO TEARS / GONIOVISC) 2.5 % ophthalmic solution Place 1 drop into the left eye 3 (three) times daily. 15 mL 12  . lisinopril (ZESTRIL) 2.5 MG tablet Take 1 tablet (2.5 mg total) by mouth daily. 30 tablet 0  . metFORMIN (GLUCOPHAGE) 500 MG tablet Take 1 tablet (500 mg total) by mouth 2 (two) times daily with a meal. 60 tablet 0  . Multiple Vitamin (MULTI-VITAMIN DAILY PO) Take 1 tablet by mouth daily.     . ondansetron (ZOFRAN-ODT) 4 MG disintegrating tablet Take 4 mg by mouth every 8 (eight) hours as needed for nausea/vomiting.    . pantoprazole (PROTONIX) 40 MG tablet Take 40 mg by mouth daily.    . polyethylene glycol (MIRALAX / GLYCOLAX) 17 g packet Take 17 g by mouth daily. 30 each 0  . pregabalin (LYRICA) 100 MG capsule Take 1 capsule (100 mg total) by mouth every evening. 30 capsule 0  .  scopolamine (TRANSDERM-SCOP) 1 MG/3DAYS Place 1 patch (1.5 mg total) onto the skin every 3 (three) days. Starting Friday morning 10 patch 1  . terazosin (HYTRIN) 2 MG capsule Take 1 capsule (2 mg total) by mouth at bedtime. 30 capsule 0  . traMADol (ULTRAM) 50 MG tablet Take 1 tablet (50 mg total) by mouth every 12 (twelve) hours as needed. (Patient taking differently: Take 50 mg by mouth every 12 (twelve) hours as needed for moderate pain.) 10 tablet 0   No current facility-administered medications for this visit.    REVIEW OF SYSTEMS:  [X]  denotes positive finding, [ ]  denotes negative finding Vascular    Leg swelling     Cardiac    Chest pain or chest pressure:    Shortness of breath upon exertion:    Short of breath when lying flat:    Irregular heart rhythm:    Constitutional    Fever or chills:     PHYSICAL EXAM:   Vitals:   01/16/20 0927 01/16/20 0931  BP: (!) 144/73 (!) 158/75  Pulse: 63   Resp: 20   Temp: 97.9 F (36.6 C)   SpO2: 94%   Weight: 158 lb (71.7 kg)   Height: 5\' 7"  (1.702 m)     GENERAL: The patient is a well-nourished female, in no acute distress. The vital signs are documented above. CARDIOVASCULAR: There is a regular rate and rhythm. PULMONARY: There is good air exchange bilaterally without wheezing or rales. Her left neck incision is healing nicely. NEURO: She has no focal weakness or paresthesias.  DATA:   No new data  MEDICAL ISSUES:   STATUS POST LEFT CAROTID ENDARTERECTOMY: The patient is doing well status post left carotid endarterectomy.  She has no significant stenosis on the right side.  I have ordered a follow-up carotid duplex scan in 9 months and I will see her at that time.  She knows to call sooner if she has problems.  With respect to her issues with her medications I have asked her to follow-up with her primary care physician.  VASCULAR QUALITY INITIATIVE: She is on aspirin and is on a statin.  03/15/20 [facility] [phone]

## 2020-01-17 ENCOUNTER — Other Ambulatory Visit: Payer: Self-pay

## 2020-01-17 DIAGNOSIS — I6522 Occlusion and stenosis of left carotid artery: Secondary | ICD-10-CM

## 2020-01-23 DIAGNOSIS — I679 Cerebrovascular disease, unspecified: Secondary | ICD-10-CM | POA: Diagnosis not present

## 2020-01-23 DIAGNOSIS — N760 Acute vaginitis: Secondary | ICD-10-CM | POA: Diagnosis not present

## 2020-01-23 DIAGNOSIS — I10 Essential (primary) hypertension: Secondary | ICD-10-CM | POA: Diagnosis not present

## 2020-01-23 DIAGNOSIS — I69328 Other speech and language deficits following cerebral infarction: Secondary | ICD-10-CM | POA: Diagnosis not present

## 2020-01-23 DIAGNOSIS — E119 Type 2 diabetes mellitus without complications: Secondary | ICD-10-CM | POA: Diagnosis not present

## 2020-01-23 DIAGNOSIS — I69359 Hemiplegia and hemiparesis following cerebral infarction affecting unspecified side: Secondary | ICD-10-CM | POA: Diagnosis not present

## 2020-01-23 DIAGNOSIS — R5383 Other fatigue: Secondary | ICD-10-CM | POA: Diagnosis not present

## 2020-01-23 DIAGNOSIS — R8290 Unspecified abnormal findings in urine: Secondary | ICD-10-CM | POA: Diagnosis not present

## 2020-01-23 DIAGNOSIS — B9689 Other specified bacterial agents as the cause of diseases classified elsewhere: Secondary | ICD-10-CM | POA: Diagnosis not present

## 2020-01-23 DIAGNOSIS — R5381 Other malaise: Secondary | ICD-10-CM | POA: Diagnosis not present

## 2020-01-23 DIAGNOSIS — D508 Other iron deficiency anemias: Secondary | ICD-10-CM | POA: Diagnosis not present

## 2020-02-04 DIAGNOSIS — I63531 Cerebral infarction due to unspecified occlusion or stenosis of right posterior cerebral artery: Secondary | ICD-10-CM | POA: Diagnosis not present

## 2020-02-12 DIAGNOSIS — Z452 Encounter for adjustment and management of vascular access device: Secondary | ICD-10-CM | POA: Diagnosis not present

## 2020-02-12 DIAGNOSIS — K5900 Constipation, unspecified: Secondary | ICD-10-CM | POA: Diagnosis not present

## 2020-02-12 DIAGNOSIS — R110 Nausea: Secondary | ICD-10-CM | POA: Diagnosis not present

## 2020-02-12 DIAGNOSIS — R339 Retention of urine, unspecified: Secondary | ICD-10-CM | POA: Diagnosis not present

## 2020-02-12 DIAGNOSIS — E785 Hyperlipidemia, unspecified: Secondary | ICD-10-CM | POA: Diagnosis not present

## 2020-02-12 DIAGNOSIS — Z8701 Personal history of pneumonia (recurrent): Secondary | ICD-10-CM | POA: Diagnosis not present

## 2020-02-12 DIAGNOSIS — F17200 Nicotine dependence, unspecified, uncomplicated: Secondary | ICD-10-CM | POA: Diagnosis not present

## 2020-02-12 DIAGNOSIS — E860 Dehydration: Secondary | ICD-10-CM | POA: Diagnosis not present

## 2020-02-12 DIAGNOSIS — E1142 Type 2 diabetes mellitus with diabetic polyneuropathy: Secondary | ICD-10-CM | POA: Diagnosis not present

## 2020-02-12 DIAGNOSIS — I10 Essential (primary) hypertension: Secondary | ICD-10-CM | POA: Diagnosis not present

## 2020-02-12 DIAGNOSIS — N390 Urinary tract infection, site not specified: Secondary | ICD-10-CM | POA: Diagnosis not present

## 2020-02-12 DIAGNOSIS — I69391 Dysphagia following cerebral infarction: Secondary | ICD-10-CM | POA: Diagnosis not present

## 2020-02-21 ENCOUNTER — Ambulatory Visit: Payer: PPO | Admitting: Neurology

## 2020-02-21 ENCOUNTER — Encounter: Payer: Self-pay | Admitting: Neurology

## 2020-02-21 VITALS — BP 135/69 | HR 64 | Ht 67.0 in | Wt 135.0 lb

## 2020-02-21 DIAGNOSIS — H53462 Homonymous bilateral field defects, left side: Secondary | ICD-10-CM

## 2020-02-21 DIAGNOSIS — I672 Cerebral atherosclerosis: Secondary | ICD-10-CM | POA: Diagnosis not present

## 2020-02-21 DIAGNOSIS — B0229 Other postherpetic nervous system involvement: Secondary | ICD-10-CM | POA: Diagnosis not present

## 2020-02-21 DIAGNOSIS — I63332 Cerebral infarction due to thrombosis of left posterior cerebral artery: Secondary | ICD-10-CM

## 2020-02-21 MED ORDER — PREGABALIN 50 MG PO CAPS: 50.0000 mg | ORAL_CAPSULE | Freq: Three times a day (TID) | ORAL | 2 refills | Status: DC

## 2020-02-21 NOTE — Progress Notes (Signed)
[facility] [address] [place]. [place] [address] [phone]       OFFICE CONSULT NOTE  [name] Date of Birth:  1945/07/07 Medical Record Number:  [id number]   Referring MD: Dr. Antonietta Barcelona   Reason for Referral: Stroke  HPI: Chelsea [name] is a 7 Caucasian lady seen today for initial office consultation visit for stroke.  She is accompanied by her daughter Chelsea [name].  History is obtained from the patient, review of electronic medical records and I personally reviewed pertinent imaging films and PACS.  She has past medical history of diabetes, hypertension, cardiac arrest who presented to [facility] emergency room on 11/07/2019 with confusion and she was a poor historian upon arrival.  Her blood pressure was found to be significantly elevated to 1 6/79.  She fell 1 week prior that she had lost vision in the left eye.  She had an MRI scan done after arrival which showed a right PCA infarct most involving the right hippocampus.  CT angiogram showed 80% focal left proximal internal carotid artery stenosis.  Left vertebral artery was occluded at the origin.  There was diffuse intracranial atherosclerotic changes involving both posterior cerebral arteries.  She was seen by vascular surgery and underwent elective left carotid endarterectomy in 12/03/2019 by Dr. Arbie Cookey.  She was placed on aspirin Plavix due to intracranial atherosclerosis.  Her LDL cholesterol is 121 she started on Lipitor 80.  Hemoglobin A1c was significantly elevated at 10.1.  Patient states that she continues to have persistent left-sided vision difficulties however is she has been quite sick with recurrent bouts of urinary tract infections as well as severe nausea as well as alternating constipation and diarrhea.  She has been on antibiotics several courses.  She is quite frustrated with the primary care physician and has recently changed to a new primary care physician whom she has not seen yet.  She is barely able  to walk with a walker and requires constant help from her daughter and she is now moved in to live with the daughter.  She still has poor balance and poor vision weakness is generalized.  She remains on aspirin and Plavix and denies significant bruising or bleeding.  She is also complaining of increased resurgence of her shingles pain in the left arm.  She had good response with Lyrica in the past but she had stopped it for few months and when she took 1 tablet of 100 mg she could not tolerated and hence she is not taking it since.  ROS:   14 system review of systems is positive for nausea, decreased appetite, dizziness, weakness, vision loss and all other systems negative  PMH:  Past Medical History:  Diagnosis Date  . Blind left eye   . Cardiac arrest (HCC)   . Carotid artery occlusion   . Diabetes mellitus without complication (HCC)   . Hypertension   . Stroke [facility])     Social History:  Social History   Socioeconomic History  . Marital status: Divorced    Spouse name: Not on file  . Number of children: Not on file  . Years of education: Not on file  . Highest education level: Not on file  Occupational History  . Not on file  Tobacco Use  . Smoking status: Former Games developer  . Smokeless tobacco: Never Used  Vaping Use  . Vaping Use: Never used  Substance and Sexual Activity  . Alcohol use: Not Currently  . Drug use: Never  .  Sexual activity: Not on file  Other Topics Concern  . Not on file  Social History Narrative   Lives with daughter, Chelsea [name]   Right Handed   Drinks no caffeine   Social Determinants of Health   Financial Resource Strain: Not on file  Food Insecurity: Not on file  Transportation Needs: Not on file  Physical Activity: Not on file  Stress: Not on file  Social Connections: Not on file  Intimate Partner Violence: Not on file    Medications:   Current Outpatient Medications on File Prior to Visit  Medication Sig Dispense Refill  . acetaminophen  (TYLENOL) 325 MG tablet Take 2 tablets (650 mg total) by mouth every 6 (six) hours as needed for mild pain (or Fever >/= 101).    Marland Kitchen amLODipine (NORVASC) 10 MG tablet Take 1 tablet (10 mg total) by mouth daily. 30 tablet 0  . artificial tears (LACRILUBE) OINT ophthalmic ointment Place into the left eye at bedtime as needed for dry eyes.    Marland Kitchen ascorbic acid (VITAMIN C) 500 MG tablet Take 1 tablet (500 mg total) by mouth daily. 30 tablet 0  . atorvastatin (LIPITOR) 80 MG tablet Take 1 tablet (80 mg total) by mouth daily. 30 tablet 0  . Calcium-Magnesium-Zinc (CAL-MAG-ZINC PO) Take 1 tablet by mouth daily.    . carvedilol (COREG) 3.125 MG tablet Take 1 tablet (3.125 mg total) by mouth daily. 60 tablet 0  . clopidogrel (PLAVIX) 75 MG tablet Take 1 tablet (75 mg total) by mouth daily. 30 tablet 0  . hydroxypropyl methylcellulose / hypromellose (ISOPTO TEARS / GONIOVISC) 2.5 % ophthalmic solution Place 1 drop into the left eye 3 (three) times daily. 15 mL 12  . lisinopril (ZESTRIL) 10 MG tablet Take 10 mg by mouth daily.    Marland Kitchen lisinopril (ZESTRIL) 2.5 MG tablet Take 1 tablet (2.5 mg total) by mouth daily. (Patient taking differently: Take 10 mg by mouth daily.) 30 tablet 0  . metFORMIN (GLUCOPHAGE) 500 MG tablet Take 1 tablet (500 mg total) by mouth 2 (two) times daily with a meal. 60 tablet 0  . Multiple Vitamin (MULTI-VITAMIN DAILY PO) Take 1 tablet by mouth daily.     . ondansetron (ZOFRAN-ODT) 4 MG disintegrating tablet Take 4 mg by mouth every 8 (eight) hours as needed for nausea/vomiting.    . pantoprazole (PROTONIX) 40 MG tablet Take 40 mg by mouth daily.    . polyethylene glycol (MIRALAX / GLYCOLAX) 17 g packet Take 17 g by mouth daily. 30 each 0  . scopolamine (TRANSDERM-SCOP) 1 MG/3DAYS Place 1 patch (1.5 mg total) onto the skin every 3 (three) days. Starting Friday morning 10 patch 1  . terazosin (HYTRIN) 2 MG capsule Take 1 capsule (2 mg total) by mouth at bedtime. 30 capsule 0  . traMADol  (ULTRAM) 50 MG tablet Take 1 tablet (50 mg total) by mouth every 12 (twelve) hours as needed. (Patient taking differently: Take 50 mg by mouth every 12 (twelve) hours as needed for moderate pain.) 10 tablet 0   No current facility-administered medications on file prior to visit.    Allergies:   Allergies  Allergen Reactions  . Codeine Anaphylaxis and Swelling    Throat swells    . Penicillins Anaphylaxis and Swelling    Throat closes    . Sulfa Antibiotics Anaphylaxis, Swelling and Other (See Comments)    Lips and eye swell   . Benzodiazepines Hives  . Lidocaine Hives  . Gabapentin Nausea And Vomiting  Physical Exam General: Elderly unwell looking Caucasian lady seated, in no evident distress Head: head normocephalic and atraumatic.   Neck: supple with no carotid or supraclavicular bruits Cardiovascular: regular rate and rhythm, no murmurs Musculoskeletal: no deformity Skin:  no rash/petichiae Vascular:  Normal pulses all extremities  Neurologic Exam Mental Status: Awake and fully alert. Oriented to place and time. Recent and remote memory intact. Attention span, concentration and fund of knowledge appropriate. Mood and affect appropriate.  Cranial Nerves: Fundoscopic exam reveals sharp disc margins. Pupils equal, briskly reactive to light. Extraocular movements full without nystagmus. Visual fields full show dense left homonymous hemianopsia confrontation. Hearing intact.  Mild left lower facial asymmetry when she smiles.  Sensation intact. Face, tongue, palate moves normally and symmetrically.  Motor: Normal bulk and tone.  Mild generalized diminished l strength in all tested extremity muscles. Sensory.:  Slightly diminished touch pinprick sensation on the left body.  Position vibration intact. Coordination: Rapid alternating movements normal in all extremities. Finger-to-nose and heel-to-shin performed accurately bilaterally. Gait and Station: Arises from chair with   difficulty. Stance is stooped she needs 1 person assist to walk.  Reflexes: 1+ and symmetric. Toes downgoing.   NIHSS 4 Modified Rankin  4   ASSESSMENT: 39 Caucasian lady with a right posterior cerebral artery infarct in October 2021 secondary to intracranial atherosclerosis.  She also has left upper extremity postherpetic neuralgic pain.  Vascular risk factors of hypertension, hyperlipidemia, diabetes and intracranial and extracranial atherosclerosis.  She has significant deconditioning due to recurrent bouts of UTI and severe nausea.     PLAN: I had a long discussion with the patient and her daughter regarding her recent right PCA infarct and intracranial atherosclerosis as well as subsequent deconditioning due to recurrent UTI and severe nausea and answered questions.  I recommend she discontinue aspirin and stay on Plavix alone as it has been more than 3 months since her stroke as well as maintain aggressive risk factor modification strict control of hypertension with blood pressure goal below 130/90, lipids with LDL cholesterol goal below 70 mg percent and diabetes with hemoglobin A1c goal below 6.5%.  She was also advised to see her primary care physician to discuss possible referral to urologist for her recurrent UTIs as well as to simplify medications to help reduce her nausea.  She will also benefit with referral for outpatient physical and occupational therapy for gait and balance training.  I also recommend she restart Lyrica 50 mg twice daily for her shingles pain in the left arm.  She will return for follow-up in the future in 3 months with my nurse practitioner Shanda Bumps or call earlier if necessary.  Greater than 50% time during this 45-minute consultation visit was spent on counseling and coordination of care about her recent stroke and intracranial stenosis and discussion about stroke prevention and postherpetic neuralgia and answering questions. Delia Heady, MD  [facility] [address] [place], [place] [address]  Phone [phone] Fax [phone] Note: This document was prepared with digital dictation and possible smart phrase technology. Any transcriptional errors that result from this process are unintentional.

## 2020-02-21 NOTE — Patient Instructions (Addendum)
I had a long discussion with the patient and her daughter regarding her recent right PCA infarct and intracranial atherosclerosis as well as subsequent deconditioning due to recurrent UTI and severe nausea and answered questions.  I recommend she discontinue aspirin and stay on Plavix alone as it has been more than 3 months since her stroke as well as maintain aggressive risk factor modification strict control of hypertension with blood pressure goal below 130/90, lipids with LDL cholesterol goal below 70 mg percent and diabetes with hemoglobin A1c goal below 6.5%.  She was also advised to see her primary care physician to discuss possible referral to urologist for her recurrent UTIs as well as to simplify medications to help reduce her nausea.  She will also benefit with referral for outpatient physical and occupational therapy for gait and balance training.  I also recommend she restart Lyrica 50 mg twice daily for her shingles pain in the left arm.  She will return for follow-up in the future in 3 months with my nurse practitioner Shanda Bumps or call earlier if necessary.

## 2020-02-25 DIAGNOSIS — M533 Sacrococcygeal disorders, not elsewhere classified: Secondary | ICD-10-CM | POA: Diagnosis not present

## 2020-02-25 DIAGNOSIS — G629 Polyneuropathy, unspecified: Secondary | ICD-10-CM | POA: Diagnosis not present

## 2020-02-25 DIAGNOSIS — Z23 Encounter for immunization: Secondary | ICD-10-CM | POA: Diagnosis not present

## 2020-02-25 DIAGNOSIS — B0229 Other postherpetic nervous system involvement: Secondary | ICD-10-CM | POA: Diagnosis not present

## 2020-02-25 DIAGNOSIS — N390 Urinary tract infection, site not specified: Secondary | ICD-10-CM | POA: Diagnosis not present

## 2020-02-25 DIAGNOSIS — R350 Frequency of micturition: Secondary | ICD-10-CM | POA: Diagnosis not present

## 2020-02-25 DIAGNOSIS — E119 Type 2 diabetes mellitus without complications: Secondary | ICD-10-CM | POA: Diagnosis not present

## 2020-02-25 DIAGNOSIS — I679 Cerebrovascular disease, unspecified: Secondary | ICD-10-CM | POA: Diagnosis not present

## 2020-02-25 DIAGNOSIS — I69328 Other speech and language deficits following cerebral infarction: Secondary | ICD-10-CM | POA: Diagnosis not present

## 2020-02-25 DIAGNOSIS — I69359 Hemiplegia and hemiparesis following cerebral infarction affecting unspecified side: Secondary | ICD-10-CM | POA: Diagnosis not present

## 2020-02-26 DIAGNOSIS — E1136 Type 2 diabetes mellitus with diabetic cataract: Secondary | ICD-10-CM | POA: Diagnosis not present

## 2020-02-26 DIAGNOSIS — Z8673 Personal history of transient ischemic attack (TIA), and cerebral infarction without residual deficits: Secondary | ICD-10-CM | POA: Diagnosis not present

## 2020-02-26 DIAGNOSIS — H43393 Other vitreous opacities, bilateral: Secondary | ICD-10-CM | POA: Diagnosis not present

## 2020-02-26 DIAGNOSIS — H524 Presbyopia: Secondary | ICD-10-CM | POA: Diagnosis not present

## 2020-02-26 DIAGNOSIS — H2513 Age-related nuclear cataract, bilateral: Secondary | ICD-10-CM | POA: Diagnosis not present

## 2020-02-27 ENCOUNTER — Telehealth: Payer: Self-pay | Admitting: Neurology

## 2020-02-27 NOTE — Telephone Encounter (Signed)
Pt's eye Dr is wanting to do cataract surgery on pt's eyes and asked the daughter(on DPR) to call to see if Dr Pearlean Brownie would object to this

## 2020-02-27 NOTE — Telephone Encounter (Signed)
Called patient's daughter back, she stated that the eye doctor didn't feel there was a problem in doing the cataract surgery but wanted her to check with Dr. Pearlean Brownie.  I advised her to have them send or fax a form to our office and Dr. Pearlean Brownie would go over it and send it back with his recommendations. Patient denied further questions, verbalized understanding and expressed appreciation for the phone call.

## 2020-02-28 ENCOUNTER — Ambulatory Visit: Payer: PPO | Attending: Neurology | Admitting: Physical Therapy

## 2020-03-06 DIAGNOSIS — I63531 Cerebral infarction due to unspecified occlusion or stenosis of right posterior cerebral artery: Secondary | ICD-10-CM | POA: Diagnosis not present

## 2020-03-11 DIAGNOSIS — R339 Retention of urine, unspecified: Secondary | ICD-10-CM | POA: Diagnosis not present

## 2020-03-11 DIAGNOSIS — E860 Dehydration: Secondary | ICD-10-CM | POA: Diagnosis not present

## 2020-03-11 DIAGNOSIS — K5900 Constipation, unspecified: Secondary | ICD-10-CM | POA: Diagnosis not present

## 2020-03-11 DIAGNOSIS — I69391 Dysphagia following cerebral infarction: Secondary | ICD-10-CM | POA: Diagnosis not present

## 2020-03-11 DIAGNOSIS — Z452 Encounter for adjustment and management of vascular access device: Secondary | ICD-10-CM | POA: Diagnosis not present

## 2020-03-11 DIAGNOSIS — R110 Nausea: Secondary | ICD-10-CM | POA: Diagnosis not present

## 2020-03-11 DIAGNOSIS — E1142 Type 2 diabetes mellitus with diabetic polyneuropathy: Secondary | ICD-10-CM | POA: Diagnosis not present

## 2020-03-11 DIAGNOSIS — F17200 Nicotine dependence, unspecified, uncomplicated: Secondary | ICD-10-CM | POA: Diagnosis not present

## 2020-03-11 DIAGNOSIS — I10 Essential (primary) hypertension: Secondary | ICD-10-CM | POA: Diagnosis not present

## 2020-03-11 DIAGNOSIS — Z8701 Personal history of pneumonia (recurrent): Secondary | ICD-10-CM | POA: Diagnosis not present

## 2020-03-11 DIAGNOSIS — E785 Hyperlipidemia, unspecified: Secondary | ICD-10-CM | POA: Diagnosis not present

## 2020-03-11 DIAGNOSIS — N390 Urinary tract infection, site not specified: Secondary | ICD-10-CM | POA: Diagnosis not present

## 2020-03-12 DIAGNOSIS — H25812 Combined forms of age-related cataract, left eye: Secondary | ICD-10-CM | POA: Diagnosis not present

## 2020-03-12 DIAGNOSIS — H2511 Age-related nuclear cataract, right eye: Secondary | ICD-10-CM | POA: Diagnosis not present

## 2020-03-18 ENCOUNTER — Other Ambulatory Visit: Payer: Self-pay

## 2020-03-18 ENCOUNTER — Ambulatory Visit (INDEPENDENT_AMBULATORY_CARE_PROVIDER_SITE_OTHER): Payer: PPO | Admitting: Physician Assistant

## 2020-03-18 ENCOUNTER — Encounter: Payer: Self-pay | Admitting: Physician Assistant

## 2020-03-18 VITALS — BP 110/64 | HR 70 | Ht 67.0 in | Wt 133.0 lb

## 2020-03-18 DIAGNOSIS — R110 Nausea: Secondary | ICD-10-CM

## 2020-03-18 DIAGNOSIS — K5900 Constipation, unspecified: Secondary | ICD-10-CM | POA: Diagnosis not present

## 2020-03-18 DIAGNOSIS — R630 Anorexia: Secondary | ICD-10-CM | POA: Diagnosis not present

## 2020-03-18 NOTE — Patient Instructions (Signed)
If you are age 76 or older, your body mass index should be between 23-30. Your Body mass index is 20.83 kg/m. If this is out of the aforementioned range listed, please consider follow up with your Primary Care Provider.  If you are age 34 or younger, your body mass index should be between 19-25. Your Body mass index is 20.83 kg/m. If this is out of the aformentioned range listed, please consider follow up with your Primary Care Provider.   Take MIralax twice daily  Increase fiber and water intake along with exercise.  Thank you for choosing me and [facility].  Hyacinth Meeker, PA-C

## 2020-03-18 NOTE — Progress Notes (Signed)
Chief Complaint: Constipation  HPI:    Chelsea [name] is a 75 year old female with a past medical history of diabetes and stroke on Plavix (echo 11/07/2019 with an LVEF 60-65%), who was referred to me by Lazoff, Shawn P, DO for a complaint of constipation.      01/23/2020 normal TSH.  CMP with an alk phos elevated at 160 and otherwise normal.  CBC with a hemoglobin decreased at 11.  Hemoglobin A1c 6.7.    Today, the patient presents to clinic accompanied by one of her daughters who does help take care of her.  She has a notebook full of notes from another daughter who lives with the patient.  Their primary complaint is that the patient is constipated, she may have a bowel movement once every week and this is after the assistance of prune juice/MiraLAX/stool softeners and sometimes suppositories.  Typically they will wait 4 to 5 days and if the patient has not had a bowel movement we will go ahead and institute all of these measures.  Patient tells me that she does feel bloated and has a decrease in appetite especially when she is constipated and does have some occasional nausea with vomiting.  She has never really tried anything on a daily basis.    Patient has had a lot of health concerns recently and tells me that she just feels tired after all of this and is about to start rehab again.    Denies fever, chills, blood in her stool or weight loss.  Past Medical History:  Diagnosis Date  . Blind left eye   . Cardiac arrest ([place])   . Carotid artery occlusion   . Diabetes mellitus without complication ([place])   . Hypertension   . Stroke [facility])     Past Surgical History:  Procedure Laterality Date  . ENDARTERECTOMY Left 12/03/2019   Procedure: LEFT CAROTID ENDARTERECTOMY;  Surgeon: Angelia Mould, MD;  Location: [place];  Service: Vascular;  Laterality: Left;  . PATCH ANGIOPLASTY Left 12/03/2019   Procedure: PATCH ANGIOPLASTY Left Carotid Artery;  Surgeon: Angelia Mould, MD;  Location:  [facility] OR;  Service: Vascular;  Laterality: Left;    Current Outpatient Medications  Medication Sig Dispense Refill  . acetaminophen (TYLENOL) 325 MG tablet Take 2 tablets (650 mg total) by mouth every 6 (six) hours as needed for mild pain (or Fever >/= 101).    Marland Kitchen amLODipine (NORVASC) 10 MG tablet Take 1 tablet (10 mg total) by mouth daily. 30 tablet 0  . artificial tears (LACRILUBE) OINT ophthalmic ointment Place into the left eye at bedtime as needed for dry eyes.    Marland Kitchen ascorbic acid (VITAMIN C) 500 MG tablet Take 1 tablet (500 mg total) by mouth daily. 30 tablet 0  . atorvastatin (LIPITOR) 80 MG tablet Take 1 tablet (80 mg total) by mouth daily. 30 tablet 0  . Calcium-Magnesium-Zinc (CAL-MAG-ZINC PO) Take 1 tablet by mouth daily.    . carvedilol (COREG) 3.125 MG tablet Take 1 tablet (3.125 mg total) by mouth daily. 60 tablet 0  . clopidogrel (PLAVIX) 75 MG tablet Take 1 tablet (75 mg total) by mouth daily. 30 tablet 0  . hydroxypropyl methylcellulose / hypromellose (ISOPTO TEARS / GONIOVISC) 2.5 % ophthalmic solution Place 1 drop into the left eye 3 (three) times daily. 15 mL 12  . lisinopril (ZESTRIL) 10 MG tablet Take 10 mg by mouth daily.    Marland Kitchen lisinopril (ZESTRIL) 2.5 MG tablet Take 1 tablet (2.5 mg total)  by mouth daily. (Patient taking differently: Take 10 mg by mouth daily.) 30 tablet 0  . metFORMIN (GLUCOPHAGE) 500 MG tablet Take 1 tablet (500 mg total) by mouth 2 (two) times daily with a meal. 60 tablet 0  . Multiple Vitamin (MULTI-VITAMIN DAILY PO) Take 1 tablet by mouth daily.     . ondansetron (ZOFRAN-ODT) 4 MG disintegrating tablet Take 4 mg by mouth every 8 (eight) hours as needed for nausea/vomiting.    . pantoprazole (PROTONIX) 40 MG tablet Take 40 mg by mouth daily.    . polyethylene glycol (MIRALAX / GLYCOLAX) 17 g packet Take 17 g by mouth daily. 30 each 0  . pregabalin (LYRICA) 50 MG capsule Take 1 capsule (50 mg total) by mouth 3 (three) times daily. 90 capsule 2  .  scopolamine (TRANSDERM-SCOP) 1 MG/3DAYS Place 1 patch (1.5 mg total) onto the skin every 3 (three) days. Starting Friday morning 10 patch 1  . terazosin (HYTRIN) 2 MG capsule Take 1 capsule (2 mg total) by mouth at bedtime. 30 capsule 0  . traMADol (ULTRAM) 50 MG tablet Take 1 tablet (50 mg total) by mouth every 12 (twelve) hours as needed. (Patient taking differently: Take 50 mg by mouth every 12 (twelve) hours as needed for moderate pain.) 10 tablet 0   No current facility-administered medications for this visit.    Allergies as of 03/18/2020 - Review Complete 02/21/2020  Allergen Reaction Noted  . Codeine Anaphylaxis and Swelling 10/30/2019  . Penicillins Anaphylaxis and Swelling 10/30/2019  . Sulfa antibiotics Anaphylaxis, Swelling, and Other (See Comments) 10/30/2019  . Benzodiazepines Hives 10/30/2019  . Lidocaine Hives 10/30/2019  . Gabapentin Nausea And Vomiting 11/07/2019    Family History  Problem Relation Age of Onset  . Heart attack Mother   . Stroke Father     Social History   Socioeconomic History  . Marital status: Divorced    Spouse name: Not on file  . Number of children: Not on file  . Years of education: Not on file  . Highest education level: Not on file  Occupational History  . Not on file  Tobacco Use  . Smoking status: Former Research scientist (life sciences)  . Smokeless tobacco: Never Used  Vaping Use  . Vaping Use: Never used  Substance and Sexual Activity  . Alcohol use: Not Currently  . Drug use: Never  . Sexual activity: Not on file  Other Topics Concern  . Not on file  Social History Narrative   Lives with daughter, Gabriel Cirri   Right Handed   Drinks no caffeine   Social Determinants of Health   Financial Resource Strain: Not on file  Food Insecurity: Not on file  Transportation Needs: Not on file  Physical Activity: Not on file  Stress: Not on file  Social Connections: Not on file  Intimate Partner Violence: Not on file    Review of Systems:     Constitutional: No fever or chills Skin: No rash  Cardiovascular: No chest pain Respiratory: No SOB Gastrointestinal: See HPI and otherwise negative Genitourinary: No dysuria Neurological: No headache Musculoskeletal: No new muscle or joint pain Hematologic: No bleeding  Psychiatric: No history of depression or anxiety    Physical Exam:  Vital signs: BP 110/64 (BP Location: Right Wrist, Patient Position: Sitting)   Pulse 70   Ht 5' 7"  (1.702 m)   Wt 133 lb (60.3 kg)   SpO2 90%   BMI 20.83 kg/m   Constitutional:   Pleasant elderly Caucasian female appears to  be in NAD, Well developed, Well nourished, alert and cooperative Head:  Normocephalic and atraumatic. Eyes:   PEERL, EOMI. No icterus. Conjunctiva pink. Ears:  Normal auditory acuity. Neck:  Supple Throat: Oral cavity and pharynx without inflammation, swelling or lesion.  Respiratory: Respirations even and unlabored. Lungs clear to auscultation bilaterally.   No wheezes, crackles, or rhonchi.  Cardiovascular: Normal S1, S2. No MRG. Regular rate and rhythm. No peripheral edema, cyanosis or pallor.  Gastrointestinal:  Soft, nondistended, nontender. No rebound or guarding. Normal bowel sounds. No appreciable masses or hepatomegaly. Rectal:  Not performed.  Msk:  Symmetrical without gross deformities. Without edema, no deformity or joint abnormality.  Neurologic:  Alert and  oriented x4;  grossly normal neurologically.  Skin:   Dry and intact without significant lesions or rashes. Psychiatric:Demonstrates good judgement and reason without abnormal affect or behaviors.  RELEVANT LABS AND IMAGING: CBC    Component Value Date/Time   WBC 13.2 (H) 12/12/2019 0811   WBC 13.3 (H) 12/12/2019 0811   RBC 3.43 (L) 12/12/2019 0811   RBC 3.48 (L) 12/12/2019 0811   HGB 10.8 (L) 12/12/2019 0811   HGB 10.7 (L) 12/12/2019 0811   HCT 32.9 (L) 12/12/2019 0811   HCT 34.1 (L) 12/12/2019 0811   PLT 350 12/12/2019 0811   PLT 349 12/12/2019  0811   MCV 95.9 12/12/2019 0811   MCV 98.0 12/12/2019 0811   MCH 31.5 12/12/2019 0811   MCH 30.7 12/12/2019 0811   MCHC 32.8 12/12/2019 0811   MCHC 31.4 12/12/2019 0811   RDW 12.8 12/12/2019 0811   RDW 12.8 12/12/2019 0811   LYMPHSABS 1.9 12/12/2019 0811   MONOABS 0.7 12/12/2019 0811   EOSABS 0.5 12/12/2019 0811   BASOSABS 0.1 12/12/2019 0811    CMP     Component Value Date/Time   NA 139 12/12/2019 0811   K 4.6 12/12/2019 0811   CL 101 12/12/2019 0811   CO2 26 12/12/2019 0811   GLUCOSE 163 (H) 12/12/2019 0811   BUN 15 12/12/2019 0811   CREATININE 1.41 (H) 12/12/2019 0811   CALCIUM 10.3 12/12/2019 0811   PROT 6.1 (L) 12/12/2019 0811   ALBUMIN 3.2 (L) 12/12/2019 0811   AST 20 12/12/2019 0811   ALT 21 12/12/2019 0811   ALKPHOS 134 (H) 12/12/2019 0811   BILITOT 0.8 12/12/2019 0811   GFRNONAA 39 (L) 12/12/2019 0811   GFRAA  09/17/2008 1612    >60        The eGFR has been calculated using the MDRD equation. This calculation has not been validated in all clinical situations. eGFR's persistently <60 mL/min signify possible Chronic Kidney Disease.    Assessment: 1.  Constipation: Likely multifactorial due to age+ medications 2.  Nausea/vomiting: Consider relation to above+/-gastritis 3.  Decreased appetite: Likely with all the above  Plan: 1.  Would recommend that we go ahead and start something on a daily basis.  Recommend to start MiraLAX twice daily.  If this does not give good results can use up to 4 times a day.  This is discussed with the patient and her family. 2.  If above is not working well for the patient in the future we can discuss prescription medicines such as Linzess/Amitiza etc. 3.  Hopefully with fixing patient's constipation, it will in turn help her nausea and decrease in appetite 4.  Patient to follow in clinic with me in 2 months.  She was assigned to Dr. Fuller Plan today.  Ellouise Newer, PA-C [facility] 03/18/2020, 11:30  AM  Cc:  Lazoff, Shawn P, DO

## 2020-03-18 NOTE — Progress Notes (Signed)
Reviewed and agree with management plan.  Swayze Pries T. Shai Rasmussen, MD FACG [phone]  

## 2020-03-19 DIAGNOSIS — H2512 Age-related nuclear cataract, left eye: Secondary | ICD-10-CM | POA: Diagnosis not present

## 2020-03-24 ENCOUNTER — Other Ambulatory Visit: Payer: Self-pay | Admitting: Physical Medicine and Rehabilitation

## 2020-03-25 ENCOUNTER — Other Ambulatory Visit: Payer: Self-pay | Admitting: Physical Medicine and Rehabilitation

## 2020-03-27 DIAGNOSIS — E785 Hyperlipidemia, unspecified: Secondary | ICD-10-CM | POA: Diagnosis not present

## 2020-03-27 DIAGNOSIS — R350 Frequency of micturition: Secondary | ICD-10-CM | POA: Diagnosis not present

## 2020-03-27 DIAGNOSIS — D508 Other iron deficiency anemias: Secondary | ICD-10-CM | POA: Diagnosis not present

## 2020-03-27 DIAGNOSIS — I10 Essential (primary) hypertension: Secondary | ICD-10-CM | POA: Diagnosis not present

## 2020-03-27 DIAGNOSIS — E119 Type 2 diabetes mellitus without complications: Secondary | ICD-10-CM | POA: Diagnosis not present

## 2020-03-27 DIAGNOSIS — N390 Urinary tract infection, site not specified: Secondary | ICD-10-CM | POA: Diagnosis not present

## 2020-03-27 DIAGNOSIS — Z79899 Other long term (current) drug therapy: Secondary | ICD-10-CM | POA: Diagnosis not present

## 2020-04-03 DIAGNOSIS — I63531 Cerebral infarction due to unspecified occlusion or stenosis of right posterior cerebral artery: Secondary | ICD-10-CM | POA: Diagnosis not present

## 2020-04-04 ENCOUNTER — Other Ambulatory Visit: Payer: Self-pay | Admitting: Physical Medicine and Rehabilitation

## 2020-04-09 ENCOUNTER — Ambulatory Visit: Payer: PPO | Attending: Neurology | Admitting: Physical Therapy

## 2020-04-09 ENCOUNTER — Other Ambulatory Visit: Payer: Self-pay

## 2020-04-09 ENCOUNTER — Encounter: Payer: Self-pay | Admitting: Physical Therapy

## 2020-04-09 DIAGNOSIS — M6281 Muscle weakness (generalized): Secondary | ICD-10-CM

## 2020-04-09 DIAGNOSIS — R262 Difficulty in walking, not elsewhere classified: Secondary | ICD-10-CM

## 2020-04-09 DIAGNOSIS — R2681 Unsteadiness on feet: Secondary | ICD-10-CM

## 2020-04-09 NOTE — Therapy (Signed)
[facility] [address] [place], [place], [address] Phone: [phone]   Fax:  [phone]  Physical Therapy Evaluation  Patient Details  Name: Chelsea [name] MRN: [id number] Date of Birth: [birth year] Referring Provider (PT): Delia Heady, MD   Encounter Date: 04/09/2020   PT End of Session - 04/09/20 2121    Visit Number 1    Number of Visits 12    Date for PT Re-Evaluation 05/28/20    Authorization Type [employer], Progress note every 10th visit    PT Start Time 1116    PT Stop Time 1201    PT Time Calculation (min) 45 min    Equipment Utilized During Treatment Other (comment)   rolling walker   Activity Tolerance Patient tolerated treatment well    Behavior During Therapy [facility] for tasks assessed/performed           Past Medical History:  Diagnosis Date  . Blind left eye   . Cardiac arrest (HCC)   . Carotid artery occlusion   . Diabetes mellitus without complication (HCC)   . Hypertension   . Stroke [facility])     Past Surgical History:  Procedure Laterality Date  . ENDARTERECTOMY Left 12/03/2019   Procedure: LEFT CAROTID ENDARTERECTOMY;  Surgeon: Chuck Hint, MD;  Location: [facility] OR;  Service: Vascular;  Laterality: Left;  . PATCH ANGIOPLASTY Left 12/03/2019   Procedure: PATCH ANGIOPLASTY Left Carotid Artery;  Surgeon: Chuck Hint, MD;  Location: [facility] OR;  Service: Vascular;  Laterality: Left;    There were no vitals filed for this visit.    Subjective Assessment - 04/09/20 2104    Subjective COVID-19 screening performed upon arrival. Patient arrives to physical therapy with reports of right sided weakness, difficulty walking, and difficulty perform ADLs and home activities secondary to a CVA. Patient's daughter reported she had 5 strokes (2 left sided, 2 right sided, and 1 posterior) since April 2021. Patient was discharged from the hospital in November 2021. Patient's daughter reported having  home health PT but was displeased with services provided. Patient is dependent on daughter for ADLs such as dressing and showering activities. Patient requires min A for chair, shower, and car transfers secondary to weakness and fall risk. Patient has not had any falls in the past 6 months but patient reports feeling dizzy and nauseous with positional changes and walking. Patient ambulates with a rolling walker with min A and contact guard. Patient and patient's daughter's goals are to improve movement, improve strength, improve transfers, and walking with AD.    Pertinent History history of multiple CVAs, HTN, DM, R LE weakness, blind left eye, Carotid artery occlusion,    Limitations Walking;House hold activities;Standing    Diagnostic tests MRI: (+) CVA    Patient Stated Goals improve walking and transfers    Currently in Pain? No/denies              [facility] PT Assessment - 04/09/20 0001      Assessment   Medical Diagnosis Cerebral infarction due to thrombosis of left posterior cerebral artery    Referring Provider (PT) Delia Heady, MD    Onset Date/Surgical Date --   April 2021   Hand Dominance Right    Next MD Visit 05/22/2020    Prior Therapy yes      Precautions   Precautions Fall    Precaution Comments contact guard for gait      Balance Screen   Has the patient fallen in the  past 6 months No    Has the patient had a decrease in activity level because of a fear of falling?  Yes    Is the patient reluctant to leave their home because of a fear of falling?  Yes      Home Environment   Living Environment Private residence    Living Arrangements Children      Prior Function   Level of Independence Needs assistance with ADLs;Needs assistance with gait;Needs assistance with homemaking;Needs assistance with transfers    Comments Daughter assists for all ADLs, home activities, transfers and ambulation      Sensation   Light Touch Impaired by gross assessment   diminished right  sided sensation     ROM / Strength   AROM / PROM / Strength AROM;Strength      AROM   Overall AROM Comments limited R shoulder AROM in comparison to L      Strength   Overall Strength Comments hip abd and add performed in sitting    Strength Assessment Site Shoulder;Knee;Hip    Right/Left Shoulder Right;Left    Right Shoulder Flexion 3-/5    Right Shoulder ABduction 3-/5    Right Shoulder Internal Rotation 3/5    Right Shoulder External Rotation 3/5    Left Shoulder Flexion 3-/5    Left Shoulder ABduction 3-/5    Left Shoulder Internal Rotation 3/5    Left Shoulder External Rotation 3/5    Right/Left Hip Right;Left    Right Hip Flexion 3-/5    Right Hip ABduction 3-/5    Right Hip ADduction 3-/5    Left Hip Flexion 3/5    Left Hip ABduction 3-/5    Left Hip ADduction 3-/5    Right/Left Knee Left;Right    Right Knee Flexion 3-/5    Right Knee Extension 3/5    Left Knee Flexion 3+/5    Left Knee Extension 3/5      Transfers   Transfers Sit to Stand;Stand to Sit    Sit to Stand 4: Min guard;With upper extremity assist;With armrests    Stand to Sit 4: Min guard;With upper extremity assist;With armrests      Ambulation/Gait   Ambulation/Gait Yes    Ambulation/Gait Assistance 4: Min assist    Ambulation/Gait Assistance Details for left listing, daughter at R side for assist    Assistive device Rolling walker    Gait Pattern Step-through pattern;Decreased stride length;Ataxic;Trunk flexed   listing to left, requiring Min A for straight ambulation   Pre-Gait Activities standing about 1 minute at rolling walker before feeling dizzy requiring to sit                      Objective measurements completed on examination: See above findings.               PT Education - 04/09/20 2120    Education Details D2 flexion, supine marching, trunk rotation    Person(s) Educated Patient;Child(ren)    Methods Explanation;Demonstration;Handout    Comprehension  Verbalized understanding;Returned demonstration               PT Long Term Goals - 04/09/20 2132      PT LONG TERM GOAL #1   Title Patient will be independent with HEP    Time 6    Period Weeks    Status New      PT LONG TERM GOAL #2   Title Patient will demonstrate 4-/5 or greater bilateral  UE and LE MMT to improve stability during functional tasks.    Time 6    Period Weeks    Status New      PT LONG TERM GOAL #3   Title Patient will perform sit<>stand transfer with close supervision and proper hand and foot placement.    Baseline min A to contact guard for transfers and proper hand placement.    Time 6    Period Weeks    Status New      PT LONG TERM GOAL #4   Title Patient will perform supine <> sit transfers with close supervision to improve ability to perform bed mobility and transfers.    Time 6    Period Weeks    Status New      PT LONG TERM GOAL #5   Title Patient will ambulate 100 feet or greater with rolling walker and contact guard to improve gait and improve accessiblity to areas of home.    Time 6    Period Weeks    Status New                  Plan - 04/09/20 2122    Clinical Impression Statement Patient is a 75 year old female who presents to physical therapy with her daughter with right sided weakness, difficulty walking, and difficulty with transfers secondary to multiple CVAs since April 2021. Patient's daughter is main historian. Patient ambulates with min A x2 for rolling walker navigation as patient lists to the left. Patient with close supervision and contact guard for sit<>stand transfers and verbal and tactile cuing for proper hand placement for safety. Patient was able to stand for about 1 minute before patient reported feeling dizzy and requested to sit down. Patient, patient's daughter and PT discussed HEP and plan of care to which patient and patient's daughter reported understanding. Patient would benefit from skilled physical therapy  to address deficits and address patient's goals.    Personal Factors and Comorbidities Age;Comorbidity 2;Comorbidity 3+    Comorbidities history of multiple CVAs, HTN, DM, R LE weakness, blind left eye, Carotid artery occlusion,    Examination-Activity Limitations Bed Mobility;Locomotion Level;Transfers;Stand;Stairs;Sleep;Sit;Hygiene/Grooming;Dressing;Toileting;Bathing    Examination-Participation Restrictions Meal Prep;Cleaning;Laundry    Stability/Clinical Decision Making Evolving/Moderate complexity    Clinical Decision Making Moderate    Rehab Potential Fair    PT Frequency 2x / week    PT Duration 6 weeks    PT Treatment/Interventions ADLs/Self Care Home Management;Gait training;Stair training;Functional mobility training;Therapeutic activities;Therapeutic exercise;Balance training;Neuromuscular re-education;Manual techniques;Passive range of motion;Wheelchair mobility training;Patient/family education    PT Next Visit Plan assess vitals and monitor dizziness, gait belt with ambulation contact guard/min A, pre-gait and standing tolerance, transfer training, LE and UE strengthening, progress to balance activities per tolerance    PT Home Exercise Plan see patient education section    Consulted and Agree with Plan of Care Patient           Patient will benefit from skilled therapeutic intervention in order to improve the following deficits and impairments:  Abnormal gait,Difficulty walking,Decreased range of motion,Decreased activity tolerance,Decreased balance,Decreased strength,Postural dysfunction,Impaired sensation,Impaired UE functional use  Visit Diagnosis: Muscle weakness (generalized) - Plan: PT plan of care cert/re-cert  Unsteadiness on feet - Plan: PT plan of care cert/re-cert  Difficulty in walking, not elsewhere classified - Plan: PT plan of care cert/re-cert     Problem List Patient Active Problem List   Diagnosis Date Noted  . Left carotid artery stenosis 12/03/2019   .  Stroke (HCC)   . Pain   . Acute ischemic right posterior cerebral artery (PCA) stroke (HCC) 11/09/2019  . Primary hypertension   . Controlled type 2 diabetes mellitus with hyperglycemia (HCC)   . Postherpetic neuralgia   . Uncontrolled type 2 diabetes mellitus with hyperglycemia, without long-term current use of insulin (HCC) 11/07/2019  . Hypertensive urgency 11/07/2019  . Stenosis of left internal carotid artery 11/07/2019  . Intractable nausea and vomiting 11/07/2019  . Nicotine dependence, cigarettes, uncomplicated 11/07/2019  . HZV (herpes zoster virus) post herpetic neuralgia 11/07/2019  . Weakness of right lower extremity 11/07/2019  . CVA (cerebral vascular accident) [facility]) 11/07/2019    Guss Bunde, PT, DPT 04/09/2020, 9:39 PM  [facility] [address] [place], [place], [address] Phone: [phone]   Fax:  [phone]  Name: CADEY BAZILE MRN: [id number] Date of Birth: August 19, 1945

## 2020-04-14 ENCOUNTER — Other Ambulatory Visit: Payer: Self-pay

## 2020-04-14 ENCOUNTER — Encounter: Payer: Self-pay | Admitting: Physical Therapy

## 2020-04-14 ENCOUNTER — Ambulatory Visit: Payer: PPO | Attending: Neurology | Admitting: Physical Therapy

## 2020-04-14 DIAGNOSIS — R2681 Unsteadiness on feet: Secondary | ICD-10-CM | POA: Insufficient documentation

## 2020-04-14 DIAGNOSIS — R262 Difficulty in walking, not elsewhere classified: Secondary | ICD-10-CM | POA: Diagnosis not present

## 2020-04-14 DIAGNOSIS — M6281 Muscle weakness (generalized): Secondary | ICD-10-CM | POA: Diagnosis not present

## 2020-04-14 NOTE — Therapy (Signed)
[facility] [address] [place], [place], [address] Phone: [phone]   Fax:  [phone]  Physical Therapy Treatment  Patient Details  Name: Chelsea [name] MRN: [id number] Date of Birth: July 06, 1945 Referring Provider (PT): Delia Heady, MD   Encounter Date: 04/14/2020   PT End of Session - 04/14/20 1212    Visit Number 2    Number of Visits 12    Date for PT Re-Evaluation 05/28/20    Authorization Type [employer], Progress note every 10th visit    PT Start Time 1119    PT Stop Time 1200    PT Time Calculation (min) 41 min    Equipment Utilized During Treatment Other (comment)   FWW   Activity Tolerance Patient tolerated treatment well    Behavior During Therapy Anxious           Past Medical History:  Diagnosis Date  . Blind left eye   . Cardiac arrest (HCC)   . Carotid artery occlusion   . Diabetes mellitus without complication (HCC)   . Hypertension   . Stroke [facility])     Past Surgical History:  Procedure Laterality Date  . ENDARTERECTOMY Left 12/03/2019   Procedure: LEFT CAROTID ENDARTERECTOMY;  Surgeon: Chuck Hint, MD;  Location: [facility] OR;  Service: Vascular;  Laterality: Left;  . PATCH ANGIOPLASTY Left 12/03/2019   Procedure: PATCH ANGIOPLASTY Left Carotid Artery;  Surgeon: Chuck Hint, MD;  Location: [facility] OR;  Service: Vascular;  Laterality: Left;    There were no vitals filed for this visit.   Subjective Assessment - 04/14/20 1211    Subjective COVID-19 screening performed upon arrival. reports that she does exercises throughout the day. Upset after learning her son in law was diagnosed with throat cancer.    Pertinent History history of multiple CVAs, HTN, DM, R LE weakness, blind left eye, Carotid artery occlusion,    Limitations Walking;House hold activities;Standing    Diagnostic tests MRI: (+) CVA    Patient Stated Goals improve walking and transfers    Currently in Pain?  No/denies              [facility] PT Assessment - 04/14/20 0001      Assessment   Medical Diagnosis Cerebral infarction due to thrombosis of left posterior cerebral artery    Referring Provider (PT) Delia Heady, MD    Hand Dominance Right    Next MD Visit 05/22/2020    Prior Therapy yes      Precautions   Precautions Fall    Precaution Comments contact guard for gait                         [facility] PT Treatment/Exercise - 04/14/20 0001      Transfers   Transfers Sit to Stand;Stand to Sit    Sit to Stand 5: Supervision;With upper extremity assist;With armrests    Stand to Sit 5: Supervision;With upper extremity assist;With armrests      Ambulation/Gait   Ambulation/Gait Yes    Ambulation/Gait Assistance 4: Min guard    Ambulation Distance (Feet) 30 Feet    Assistive device Rolling walker    Gait Pattern Step-through pattern;Decreased step length - right;Decreased stance time - right;Ataxic;Trunk rotated posteriorly on right;Narrow base of support;Trunk flexed    Ambulation Surface Level;Indoor      Exercises   Exercises Knee/Hip;Shoulder      Knee/Hip Exercises: Standing   Hip Flexion AROM;Both;2 sets;10 reps  Other Standing Knee Exercises sidestepping at countertop x3 RT      Knee/Hip Exercises: Seated   Long Arc Quad AROM;Both;2 sets;10 reps    [employer] x20 reps    Clamshell with TheraBand Yellow   x20 reps   Other Seated Knee/Hip Exercises AROM D2 x10 reps BLE    Marching AROM;Both;2 sets;10 reps    Hamstring Curl Strengthening;Both;2 sets;10 reps;Limitations    Hamstring Limitations yellow theraband    Sit to Sand 5 reps;with UE support      Shoulder Exercises: Seated   Retraction AROM;Both;15 reps    Protraction AAROM;Both;15 reps    Flexion AAROM;Both;15 reps    Abduction AROM;Both;15 reps                  PT Education - 04/14/20 1224    Education Details Safety education with daughter regarding home enviroment, sit  <>stands    Person(s) Educated Patient;Child(ren)    Methods Explanation;Demonstration    Comprehension Verbalized understanding               PT Long Term Goals - 04/09/20 2132      PT LONG TERM GOAL #1   Title Patient will be independent with HEP    Time 6    Period Weeks    Status New      PT LONG TERM GOAL #2   Title Patient will demonstrate 4-/5 or greater bilateral UE and LE MMT to improve stability during functional tasks.    Time 6    Period Weeks    Status New      PT LONG TERM GOAL #3   Title Patient will perform sit<>stand transfer with close supervision and proper hand and foot placement.    Baseline min A to contact guard for transfers and proper hand placement.    Time 6    Period Weeks    Status New      PT LONG TERM GOAL #4   Title Patient will perform supine <> sit transfers with close supervision to improve ability to perform bed mobility and transfers.    Time 6    Period Weeks    Status New      PT LONG TERM GOAL #5   Title Patient will ambulate 100 feet or greater with rolling walker and contact guard to improve gait and improve accessiblity to areas of home.    Time 6    Period Weeks    Status New                 Plan - 04/14/20 1212    Clinical Impression Statement Patient presented in clinic with no pain. Patient able to complete light AROM exercises of BLE and BUE as well. Caution provided during UE exercises to avoid aggravation of nerve pain in BUEs. Patient cautioned to not push past fatigue as well. Patient reports compliance with exercises at home multiple times a day and wishes to return to cooking. Patient required VCs for use of armrests for sit <> stands which was also instructed to her daughter as well for safety. Patient also progressed to standing activities to improve strength and function.    Personal Factors and Comorbidities Age;Comorbidity 2;Comorbidity 3+    Comorbidities history of multiple CVAs, HTN, DM, R LE  weakness, blind left eye, Carotid artery occlusion,    Examination-Activity Limitations Bed Mobility;Locomotion Level;Transfers;Stand;Stairs;Sleep;Sit;Hygiene/Grooming;Dressing;Toileting;Bathing    Examination-Participation Restrictions Meal Prep;Cleaning;Laundry    Stability/Clinical Decision Making Evolving/Moderate complexity  Rehab Potential Fair    PT Frequency 2x / week    PT Duration 6 weeks    PT Treatment/Interventions ADLs/Self Care Home Management;Gait training;Stair training;Functional mobility training;Therapeutic activities;Therapeutic exercise;Balance training;Neuromuscular re-education;Manual techniques;Passive range of motion;Wheelchair mobility training;Patient/family education    PT Next Visit Plan assess vitals and monitor dizziness, gait belt with ambulation contact guard/min A, pre-gait and standing tolerance, transfer training, LE and UE strengthening, progress to balance activities per tolerance    PT Home Exercise Plan see patient education section    Consulted and Agree with Plan of Care Patient           Patient will benefit from skilled therapeutic intervention in order to improve the following deficits and impairments:  Abnormal gait,Difficulty walking,Decreased range of motion,Decreased activity tolerance,Decreased balance,Decreased strength,Postural dysfunction,Impaired sensation,Impaired UE functional use  Visit Diagnosis: Muscle weakness (generalized)  Unsteadiness on feet  Difficulty in walking, not elsewhere classified     Problem List Patient Active Problem List   Diagnosis Date Noted  . Left carotid artery stenosis 12/03/2019  . Stroke (HCC)   . Pain   . Acute ischemic right posterior cerebral artery (PCA) stroke (HCC) 11/09/2019  . Primary hypertension   . Controlled type 2 diabetes mellitus with hyperglycemia (HCC)   . Postherpetic neuralgia   . Uncontrolled type 2 diabetes mellitus with hyperglycemia, without long-term current use of  insulin (HCC) 11/07/2019  . Hypertensive urgency 11/07/2019  . Stenosis of left internal carotid artery 11/07/2019  . Intractable nausea and vomiting 11/07/2019  . Nicotine dependence, cigarettes, uncomplicated 11/07/2019  . HZV (herpes zoster virus) post herpetic neuralgia 11/07/2019  . Weakness of right lower extremity 11/07/2019  . CVA (cerebral vascular accident) [facility]) 11/07/2019    Marvell Fuller, PTA 04/14/2020, 12:25 PM  [facility] [address] [place], [place], [address] Phone: [phone]   Fax:  [phone]  Name: Chelsea [name] MRN: [id number] Date of Birth: 02-Dec-1945

## 2020-04-16 ENCOUNTER — Encounter: Payer: PPO | Admitting: Physical Therapy

## 2020-04-16 DIAGNOSIS — R634 Abnormal weight loss: Secondary | ICD-10-CM | POA: Diagnosis not present

## 2020-04-16 DIAGNOSIS — R5383 Other fatigue: Secondary | ICD-10-CM | POA: Diagnosis not present

## 2020-04-16 DIAGNOSIS — R42 Dizziness and giddiness: Secondary | ICD-10-CM | POA: Diagnosis not present

## 2020-04-16 DIAGNOSIS — R112 Nausea with vomiting, unspecified: Secondary | ICD-10-CM | POA: Diagnosis not present

## 2020-04-16 DIAGNOSIS — N390 Urinary tract infection, site not specified: Secondary | ICD-10-CM | POA: Diagnosis not present

## 2020-04-22 DIAGNOSIS — R531 Weakness: Secondary | ICD-10-CM | POA: Diagnosis not present

## 2020-04-22 DIAGNOSIS — I10 Essential (primary) hypertension: Secondary | ICD-10-CM | POA: Diagnosis not present

## 2020-04-22 DIAGNOSIS — R42 Dizziness and giddiness: Secondary | ICD-10-CM | POA: Diagnosis not present

## 2020-04-23 ENCOUNTER — Ambulatory Visit: Payer: PPO | Admitting: Physical Therapy

## 2020-04-23 ENCOUNTER — Other Ambulatory Visit: Payer: Self-pay

## 2020-04-23 DIAGNOSIS — R262 Difficulty in walking, not elsewhere classified: Secondary | ICD-10-CM

## 2020-04-23 DIAGNOSIS — M6281 Muscle weakness (generalized): Secondary | ICD-10-CM

## 2020-04-23 DIAGNOSIS — R2681 Unsteadiness on feet: Secondary | ICD-10-CM

## 2020-04-23 NOTE — Therapy (Signed)
[facility] [address] [place], [place], [address] Phone: [phone]   Fax:  [phone]  Physical Therapy Treatment  Patient Details  Name: Chelsea [name] MRN: [id number] Date of Birth: October 25, 1945 Referring Provider (PT): Delia Heady, MD   Encounter Date: 04/23/2020   PT End of Session - 04/23/20 1035    Visit Number 3    Number of Visits 12    Date for PT Re-Evaluation 05/28/20    Authorization Type [employer], Progress note every 10th visit    PT Start Time 1035    PT Stop Time 1118    PT Time Calculation (min) 43 min    Equipment Utilized During Treatment Gait belt    Activity Tolerance Patient tolerated treatment well    Behavior During Therapy Anxious           Past Medical History:  Diagnosis Date  . Blind left eye   . Cardiac arrest (HCC)   . Carotid artery occlusion   . Diabetes mellitus without complication (HCC)   . Hypertension   . Stroke [facility])     Past Surgical History:  Procedure Laterality Date  . ENDARTERECTOMY Left 12/03/2019   Procedure: LEFT CAROTID ENDARTERECTOMY;  Surgeon: Chuck Hint, MD;  Location: [facility] OR;  Service: Vascular;  Laterality: Left;  . PATCH ANGIOPLASTY Left 12/03/2019   Procedure: PATCH ANGIOPLASTY Left Carotid Artery;  Surgeon: Chuck Hint, MD;  Location: [facility] OR;  Service: Vascular;  Laterality: Left;    There were no vitals filed for this visit.                      [facility] Adult PT Treatment/Exercise - 04/23/20 0001      Transfers   Transfers Stand Pivot Transfers;Sit to Stand    Sit to Stand 4: Min guard;With upper extremity assist;From bed    Stand to Sit 5: Supervision;With upper extremity assist;Uncontrolled descent    Stand to Sit Details (indicate cue type and reason) Tactile cues for initiation;Verbal cues for sequencing    Stand to Sit Details uncontrolled only at very end range; requires continual cues to reach back for  chair or mat    Stand Pivot Transfers 5: Supervision    Transfer Cueing where to turn, feel for chair with legs, reach back for armrests      Ambulation/Gait   Ambulation/Gait Yes    Ambulation/Gait Assistance 4: Min guard    Ambulation Distance (Feet) 40 Feet    Assistive device Rolling walker    Gait Pattern Step-through pattern    Ambulation Surface Level    Pre-Gait Activities cues for full step length with left      Knee/Hip Exercises: Standing   Hip Flexion Both;20 reps    Hip Abduction Both;10 reps    Abduction Limitations also did side step x 10 ea    Forward Step Up Both;1 set;10 reps;Step Height: 2";Hand Hold: 2    Other Standing Knee Exercises balance: Wide and narrow BOS; one and no UE support; cues for head position                       PT Long Term Goals - 04/09/20 2132      PT LONG TERM GOAL #1   Title Patient will be independent with HEP    Time 6    Period Weeks    Status New      PT LONG TERM GOAL #2  Title Patient will demonstrate 4-/5 or greater bilateral UE and LE MMT to improve stability during functional tasks.    Time 6    Period Weeks    Status New      PT LONG TERM GOAL #3   Title Patient will perform sit<>stand transfer with close supervision and proper hand and foot placement.    Baseline min A to contact guard for transfers and proper hand placement.    Time 6    Period Weeks    Status New      PT LONG TERM GOAL #4   Title Patient will perform supine <> sit transfers with close supervision to improve ability to perform bed mobility and transfers.    Time 6    Period Weeks    Status New      PT LONG TERM GOAL #5   Title Patient will ambulate 100 feet or greater with rolling walker and contact guard to improve gait and improve accessiblity to areas of home.    Time 6    Period Weeks    Status New                 Plan - 04/23/20 1227    Clinical Impression Statement Patient did well with activity today. Still  very anxious. She does well with pivot transfers and supervision. Discussed acquiring grab bar for her bed to help with bed mobility. Plan to work on bed mobility at next visit. Gait requires ongoing cues for full left step length.    Comorbidities history of multiple CVAs, HTN, DM, R LE weakness, blind left eye, Carotid artery occlusion,    PT Frequency 2x / week    PT Duration 6 weeks    PT Treatment/Interventions ADLs/Self Care Home Management;Gait training;Stair training;Functional mobility training;Therapeutic activities;Therapeutic exercise;Balance training;Neuromuscular re-education;Manual techniques;Passive range of motion;Wheelchair mobility training;Patient/family education    PT Next Visit Plan assess vitals and monitor dizziness, work on bed mobility; gait belt with ambulation contact guard/min A, pre-gait and standing tolerance, transfer training, LE and UE strengthening, progress to balance activities per tolerance    Consulted and Agree with Plan of Care Patient           Patient will benefit from skilled therapeutic intervention in order to improve the following deficits and impairments:  Abnormal gait,Difficulty walking,Decreased range of motion,Decreased activity tolerance,Decreased balance,Decreased strength,Postural dysfunction,Impaired sensation,Impaired UE functional use  Visit Diagnosis: Muscle weakness (generalized)  Unsteadiness on feet  Difficulty in walking, not elsewhere classified     Problem List Patient Active Problem List   Diagnosis Date Noted  . Left carotid artery stenosis 12/03/2019  . Stroke (HCC)   . Pain   . Acute ischemic right posterior cerebral artery (PCA) stroke (HCC) 11/09/2019  . Primary hypertension   . Controlled type 2 diabetes mellitus with hyperglycemia (HCC)   . Postherpetic neuralgia   . Uncontrolled type 2 diabetes mellitus with hyperglycemia, without long-term current use of insulin (HCC) 11/07/2019  . Hypertensive urgency  11/07/2019  . Stenosis of left internal carotid artery 11/07/2019  . Intractable nausea and vomiting 11/07/2019  . Nicotine dependence, cigarettes, uncomplicated 11/07/2019  . HZV (herpes zoster virus) post herpetic neuralgia 11/07/2019  . Weakness of right lower extremity 11/07/2019  . CVA (cerebral vascular accident) (HCC) 11/07/2019    Solon Palm PT 04/23/2020, 12:34 PM  [facility] [address] [place], [place], [address] Phone: [phone]   Fax:  [phone]  Name: Chelsea [name] MRN:  [id number] Date of Birth: [birth year]

## 2020-04-24 ENCOUNTER — Ambulatory Visit: Payer: PPO | Admitting: Physical Therapy

## 2020-04-24 ENCOUNTER — Telehealth: Payer: Self-pay | Admitting: Physical Therapy

## 2020-04-24 NOTE — Telephone Encounter (Signed)
Patient called l/m she is sick today and will not be here.

## 2020-05-04 DIAGNOSIS — I63531 Cerebral infarction due to unspecified occlusion or stenosis of right posterior cerebral artery: Secondary | ICD-10-CM | POA: Diagnosis not present

## 2020-05-05 DIAGNOSIS — B9689 Other specified bacterial agents as the cause of diseases classified elsewhere: Secondary | ICD-10-CM | POA: Diagnosis not present

## 2020-05-05 DIAGNOSIS — N760 Acute vaginitis: Secondary | ICD-10-CM | POA: Diagnosis not present

## 2020-05-05 DIAGNOSIS — N1831 Chronic kidney disease, stage 3a: Secondary | ICD-10-CM | POA: Diagnosis not present

## 2020-05-05 DIAGNOSIS — N390 Urinary tract infection, site not specified: Secondary | ICD-10-CM | POA: Diagnosis not present

## 2020-05-19 DIAGNOSIS — B9689 Other specified bacterial agents as the cause of diseases classified elsewhere: Secondary | ICD-10-CM | POA: Diagnosis not present

## 2020-05-19 DIAGNOSIS — N760 Acute vaginitis: Secondary | ICD-10-CM | POA: Diagnosis not present

## 2020-05-22 ENCOUNTER — Ambulatory Visit: Payer: PPO | Admitting: Adult Health

## 2020-05-22 ENCOUNTER — Encounter: Payer: Self-pay | Admitting: Adult Health

## 2020-05-22 VITALS — BP 160/78 | HR 64 | Ht 67.0 in | Wt 130.0 lb

## 2020-05-22 DIAGNOSIS — B0229 Other postherpetic nervous system involvement: Secondary | ICD-10-CM | POA: Diagnosis not present

## 2020-05-22 DIAGNOSIS — Z8673 Personal history of transient ischemic attack (TIA), and cerebral infarction without residual deficits: Secondary | ICD-10-CM | POA: Diagnosis not present

## 2020-05-22 NOTE — Progress Notes (Signed)
[facility] [address] [place] [address] [phone]       OFFICE CONSULT NOTE  [name] Date of Birth:  1945-09-28 Medical Record Number:  [id number]   Referring MD: Dr. Antonietta [name]   Reason for Referral: Stroke   Chief Complaint  Patient presents with  . Follow-up    Tr with daughter Chelsea [name]) Pt is well and stable. Balance disturbance       HPI:   Today, 05/22/2020, [name] returns for stroke follow-up accompanied by her daughter after prior visit 3 months ago with Dr. Pearlean [name].  She has been doing well since prior visit recently started working with PT for gait and balance impairment. She continues to use rolling walker and denies any recent falls.  Continues to have mild right sided weakness since her stroke with some improvement since prior visit.  Denies new stroke/TIA symptoms.  Compliant on Plavix without associated side effects Self discontinued atorvastatin due to continued nausea and vomiting which subsided after stopping.  PCP recently started omega-3 but she has been tolerating without side effects. Blood pressure today 160/78 - monitors at home and has been uncontrolled with PCP managing  Recent LDL 87; recent A1c 6.2  Daughter is frustrated regarding continued difficulty controlling blood pressure as well as recurrent UTIs. Denies being referred to any specialty such as cardiology or urology  No further concerns at this time      History provided for reference purposes only Consult visit 02/21/2020 Dr. Pearlean [name]: [name] is a 30 Caucasian lady seen today for initial office consultation visit for stroke.  She is accompanied by her daughter Chelsea [name].  History is obtained from the patient, review of electronic medical records and I personally reviewed pertinent imaging films and PACS.  She has past medical history of diabetes, hypertension, cardiac arrest who presented to [facility] emergency room on 11/07/2019 with confusion and  she was a poor historian upon arrival.  Her blood pressure was found to be significantly elevated to 1 6/79.  She fell 1 week prior that she had lost vision in the left eye.  She had an MRI scan done after arrival which showed a right PCA infarct most involving the right hippocampus.  CT angiogram showed 80% focal left proximal internal carotid artery stenosis.  Left vertebral artery was occluded at the origin.  There was diffuse intracranial atherosclerotic changes involving both posterior cerebral arteries.  She was seen by vascular surgery and underwent elective left carotid endarterectomy in 12/03/2019 by Dr. Arbie Cookey.  She was placed on aspirin Plavix due to intracranial atherosclerosis.  Her LDL cholesterol is 121 she started on Lipitor 80.  Hemoglobin A1c was significantly elevated at 10.1.  Patient states that she continues to have persistent left-sided vision difficulties however is she has been quite sick with recurrent bouts of urinary tract infections as well as severe nausea as well as alternating constipation and diarrhea.  She has been on antibiotics several courses.  She is quite frustrated with the primary care physician and has recently changed to a new primary care physician whom she has not seen yet.  She is barely able to walk with a walker and requires constant help from her daughter and she is now moved in to live with the daughter.  She still has poor balance and poor vision weakness is generalized.  She remains on aspirin and Plavix and denies significant bruising or bleeding.  She is also complaining of increased resurgence of her shingles  pain in the left arm.  She had good response with Lyrica in the past but she had stopped it for few months and when she took 1 tablet of 100 mg she could not tolerated and hence she is not taking it since.  ROS:   14 system review of systems is positive for those listed in HPI and all other systems negative  PMH:  Past Medical History:  Diagnosis Date   . Blind left eye   . Cardiac arrest (HCC)   . Carotid artery occlusion   . Diabetes mellitus without complication (HCC)   . Hypertension   . Stroke [facility])     Social History:  Social History   Socioeconomic History  . Marital status: Divorced    Spouse name: Not on file  . Number of children: Not on file  . Years of education: Not on file  . Highest education level: Not on file  Occupational History  . Not on file  Tobacco Use  . Smoking status: Former Games developer  . Smokeless tobacco: Never Used  Vaping Use  . Vaping Use: Never used  Substance and Sexual Activity  . Alcohol use: Not Currently  . Drug use: Never  . Sexual activity: Not on file  Other Topics Concern  . Not on file  Social History Narrative   Lives with daughter, Chelsea [name]   Right Handed   Drinks no caffeine   Social Determinants of Health   Financial Resource Strain: Not on file  Food Insecurity: Not on file  Transportation Needs: Not on file  Physical Activity: Not on file  Stress: Not on file  Social Connections: Not on file  Intimate Partner Violence: Not on file    Medications:   Current Outpatient Medications on File Prior to Visit  Medication Sig Dispense Refill  . acetaminophen (TYLENOL) 325 MG tablet Take 2 tablets (650 mg total) by mouth every 6 (six) hours as needed for mild pain (or Fever >/= 101).    Marland Kitchen amLODipine (NORVASC) 10 MG tablet Take 1 tablet (10 mg total) by mouth daily. 30 tablet 0  . artificial tears (LACRILUBE) OINT ophthalmic ointment Place into the left eye at bedtime as needed for dry eyes.    . Calcium-Magnesium-Zinc (CAL-MAG-ZINC PO) Take 1 tablet by mouth daily.    . carvedilol (COREG) 3.125 MG tablet Take 1 tablet (3.125 mg total) by mouth daily. 60 tablet 0  . clopidogrel (PLAVIX) 75 MG tablet Take 1 tablet (75 mg total) by mouth daily. 30 tablet 0  . hydrALAZINE (APRESOLINE) 25 MG tablet Take 25 mg by mouth 2 (two) times daily as needed.    Marland Kitchen lisinopril (ZESTRIL) 10 MG  tablet Take 10 mg by mouth daily.    . meclizine (ANTIVERT) 25 MG tablet Take 25 mg by mouth 3 (three) times daily as needed for dizziness.    . metFORMIN (GLUCOPHAGE) 500 MG tablet Take 1 tablet (500 mg total) by mouth 2 (two) times daily with a meal. 60 tablet 0  . ondansetron (ZOFRAN-ODT) 4 MG disintegrating tablet Take 4 mg by mouth every 8 (eight) hours as needed for nausea/vomiting.    . polyethylene glycol (MIRALAX / GLYCOLAX) 17 g packet Take 17 g by mouth daily. 30 each 0  . pregabalin (LYRICA) 50 MG capsule Take 1 capsule (50 mg total) by mouth 3 (three) times daily. 90 capsule 2  . scopolamine (TRANSDERM-SCOP) 1 MG/3DAYS Place 1 patch (1.5 mg total) onto the skin every 3 (three) days. Starting  Friday morning 10 patch 1   No current facility-administered medications on file prior to visit.    Allergies:   Allergies  Allergen Reactions  . Codeine Anaphylaxis and Swelling    Throat swells    . Penicillins Anaphylaxis and Swelling    Throat closes    . Sulfa Antibiotics Anaphylaxis, Swelling and Other (See Comments)    Lips and eye swell   . Benzodiazepines Hives  . Lidocaine Hives  . Gabapentin Nausea And Vomiting  . Lipitor [Atorvastatin]     Vomiting   . Lyrica [Pregabalin]     Vomiting     Physical Exam Today's Vitals   05/22/20 1312  BP: (!) 160/78  Pulse: 64  Weight: 130 lb (59 kg)  Height: 5\' 7"  (1.702 m)   Body mass index is 20.36 kg/m.   General: Elderly unwell looking Caucasian lady seated although fidgety throughout visit Head: head normocephalic and atraumatic.   Neck: supple with no carotid or supraclavicular bruits Cardiovascular: regular rate and rhythm, no murmurs Musculoskeletal: no deformity Skin:  no rash/petichiae Vascular:  Normal pulses all extremities  Neurologic Exam Mental Status: Awake and fully alert.  Fluent speech and language.  Oriented to place and time. Recent and remote memory intact. Attention span, concentration and fund of  knowledge appropriate. Mood and affect appropriate.  Cranial Nerves: Pupils equal, briskly reactive to light. Extraocular movements full without nystagmus. Visual fields full show dense left homonymous hemianopsia confrontation. Hearing intact.  Mild left lower facial asymmetry when she smiles.  Sensation intact. tongue, palate moves normally and symmetrically.  Motor: Normal bulk and tone.  Mild generalized diminished strength in all tested extremity muscles R>L Sensory.:  Slightly decreased light touch sensation right upper and lower extremity Coordination: Rapid alternating movements normal in all extremities except decreased right hand. Finger-to-nose and heel-to-shin mild incoordination on right side. Gait and Station: Arises from chair with  difficulty. Stance is stooped.  Gait demonstrates short shuffled steps with use of a Rollator walker-difficulty ambulating long distance  Reflexes: 1+ and symmetric. Toes downgoing.        ASSESSMENT: 81 Caucasian lady with a right posterior cerebral artery infarct in October 2021 secondary to intracranial atherosclerosis.  She also has left upper extremity postherpetic neuralgic pain.  Vascular risk factors of hypertension, hyperlipidemia, diabetes, L CEA 11/2019 and intracranial and extracranial atherosclerosis.  She has significant deconditioning due to recurrent bouts of UTI      PLAN:  1.  History of multiple strokes -Generalized weakness R>L and gait impairment with imbalance -encourage continued participation with outpatient PT and use of rolling walker at all times unless otherwise instructed -Recurrent UTIs interfering with full ability to participate in therapy -advised to follow-up with PCP but recommend referral to urology -Continue Plavix for secondary stroke prevention -Discussed secondary stroke prevention measures and importance of close PCP follow-up for aggressive stroke risk factor management including HTN with BP  goal<130/90 -Longstanding uncontrolled HTN -advised to follow-up with PCP but would highly recommend referral to cardiology  2.  Postherpetic neuralgia -hx of shingles - stable -on pregabalin 50 mg 3 times daily - defer further management to PCP   Follow-up in 6 months or call earlier if needed    CC:  [facility] provider: 12/2019, NP   Roe Rutherford, AGNP-BC  [facility] [address] [place], [place]  Phone [phone] Fax [phone] Note: This document was prepared with digital dictation and possible smart phrase technology. Any transcriptional errors  that result from this process are unintentional.

## 2020-05-22 NOTE — Patient Instructions (Addendum)
Recommend continued participation in PT   Continue clopidogrel 75 mg daily for secondary stroke prevention  Would highly recommend being seen by cardiology for uncontrolled blood pressures and possible irregular heart rate as well as urology for recurrent UTI's   Continue to follow up with PCP regarding cholesterol and blood pressure management  Maintain strict control of hypertension with blood pressure goal below 130/90 and cholesterol with LDL cholesterol (bad cholesterol) goal below 70 mg/dL.      Followup in the future with me in 6 months or call earlier if needed      Thank you for coming to see [place] at [facility]. I hope we have been able to provide you high quality care today.  You may receive a patient satisfaction survey over the next few weeks. We would appreciate your feedback and comments so that we may continue to improve ourselves and the health of our patients.

## 2020-05-22 NOTE — Progress Notes (Signed)
I agree with the above plan 

## 2020-05-27 ENCOUNTER — Telehealth: Payer: Self-pay | Admitting: Adult Health

## 2020-05-27 DIAGNOSIS — I10 Essential (primary) hypertension: Secondary | ICD-10-CM

## 2020-05-27 DIAGNOSIS — Z8673 Personal history of transient ischemic attack (TIA), and cerebral infarction without residual deficits: Secondary | ICD-10-CM

## 2020-05-27 NOTE — Telephone Encounter (Signed)
Pt's daughter, Geanie Cooley called, returned phone. Would like a call back.Chelsea [name]

## 2020-05-27 NOTE — Telephone Encounter (Signed)
I faxed over ofv note to [facility] after getting fax # (got confirmation).    For referral to cardiology if needed.  [facility] is preferable.

## 2020-05-27 NOTE — Telephone Encounter (Signed)
Referral sent to [facility] cardiology for evaluation of continued uncontrolled blood pressure which increases risk of additional recurrent strokes as well as other health-related concerns

## 2020-05-27 NOTE — Telephone Encounter (Signed)
Called daughter and relayed that referral was made for [facility].  I gave her there # to f/u about appt.  She may be seen in [place], [place] if they came out there.  She apprecicated this.

## 2020-05-27 NOTE — Telephone Encounter (Signed)
Called and no answer and VM not set up.  I called again no answer no VM set up.

## 2020-05-27 NOTE — Telephone Encounter (Signed)
Pt's daughter, Geanie Cooley (on [place]) called, PCP has not called, in the referral for

## 2020-06-03 DIAGNOSIS — I63531 Cerebral infarction due to unspecified occlusion or stenosis of right posterior cerebral artery: Secondary | ICD-10-CM | POA: Diagnosis not present

## 2020-06-10 DIAGNOSIS — N390 Urinary tract infection, site not specified: Secondary | ICD-10-CM | POA: Diagnosis not present

## 2020-06-10 DIAGNOSIS — N898 Other specified noninflammatory disorders of vagina: Secondary | ICD-10-CM | POA: Diagnosis not present

## 2020-06-16 DIAGNOSIS — N390 Urinary tract infection, site not specified: Secondary | ICD-10-CM | POA: Diagnosis not present

## 2020-06-16 DIAGNOSIS — N898 Other specified noninflammatory disorders of vagina: Secondary | ICD-10-CM | POA: Diagnosis not present

## 2020-06-17 ENCOUNTER — Ambulatory Visit: Payer: PPO | Admitting: Physician Assistant

## 2020-06-17 NOTE — Progress Notes (Signed)
Cardiology Office Note:    Date:  07/10/2020   ID:  Chelsea, [name] 01-02-1946, MRN [id number]  PCP:  Roe Rutherford, NP  Cardiologist:  Jodelle Red, MD  Referring MD: Ihor Austin, NP   CC: new patient evaluation for uncontrolled hypertension  History of Present Illness:    Chelsea [name] is a 75 y.o. female with a hx of cardiac arrest, carotid artery occlusion, diabetes mellitus without complication, hypertension, and stroke who is seen as a new consult at the request of Ihor Austin, NP for the evaluation and management of uncontrolled hypertension. She was last seen by Ihor Austin, NP 05/22/2020 for continued stroke symptom management.  Today:  Cardiovascular risk factors: Prior clinical ASCVD: Stroke (1st in 04/2019), Comorbid conditions, including hypertension, hyperlipidemia, diabetes, chronic kidney disease: Hypertension,diabetes Metabolic syndrome/Obesity: none Chronic inflammatory conditions: none Tobacco use history: never Family history: she is not cerntain Prior cardiac testing and/or incidental findings on other testing (ie coronary calcium): echo 2021 Exercise level: minimal activity Current diet: Appetite is improving  She is accompanied by her daughter, who also provides some history. Her main complaint today is her hypertension, and she currently has LE edema in her right ankle. At home her blood pressure is occasionally in the 190s-200s at 1:00 in the morning. Rarely, she reports her blood pressure bottoms out in the 50's. During a previous clinic visit, a 40-50 point difference was found when measuring her orthostatics. She is taking carvedilol only once a day. They discontinued atorvastatin due to side effects making her feel ill and decreasing her appetite.    At home she sometimes has shortness of breath. She occasionally requires the use of an aspirator to help clear sputum.  Last April, her right leg gave out and she was unable to feel  pain in her right LE. They were told this was likely the time of her first stroke.  Of note, her daughter reports that she suffers from persistent UTI's. She has recently tested negative for a UTI, but she is concerned that her urine has a malodorous odor.  She denies any chest pain, shortness of breath, palpitations, or exertional symptoms. No headaches, lightheadedness or syncope to report. Also has no orthopnea or PND.  They are distressed due to poor treatment and disrespect in hospitals, and she is fearful of returning to the ED.  Past Medical History:  Diagnosis Date   Blind left eye    Cardiac arrest [facility])    Carotid artery occlusion    Diabetes mellitus without complication (HCC)    Hypertension    Stroke [facility])     Past Surgical History:  Procedure Laterality Date   ENDARTERECTOMY Left 12/03/2019   Procedure: LEFT CAROTID ENDARTERECTOMY;  Surgeon: Chuck Hint, MD;  Location: [facility] OR;  Service: Vascular;  Laterality: Left;   PATCH ANGIOPLASTY Left 12/03/2019   Procedure: PATCH ANGIOPLASTY Left Carotid Artery;  Surgeon: Chuck Hint, MD;  Location: [facility] OR;  Service: Vascular;  Laterality: Left;    Current Medications: Current Outpatient Medications on File Prior to Visit  Medication Sig   amLODipine (NORVASC) 10 MG tablet Take 1 tablet (10 mg total) by mouth daily.   clopidogrel (PLAVIX) 75 MG tablet Take 1 tablet (75 mg total) by mouth daily.   lisinopril (ZESTRIL) 10 MG tablet Take 10 mg by mouth daily.   meclizine (ANTIVERT) 25 MG tablet Take 25 mg by mouth 3 (three) times daily as needed for dizziness.   polyethylene glycol (MIRALAX /  GLYCOLAX) 17 g packet Take 17 g by mouth daily. (Patient taking differently: Take 17 g by mouth every 3 (three) days.)   [DISCONTINUED] Calcium-Magnesium-Zinc (CAL-MAG-ZINC PO) Take 1 tablet by mouth daily. (Patient not taking: Reported on 06/25/2020)   [DISCONTINUED] pregabalin (LYRICA) 50 MG capsule Take 1 capsule (50 mg  total) by mouth 3 (three) times daily. (Patient not taking: No sig reported)   No current facility-administered medications on file prior to visit.     Allergies:   Codeine, Penicillins, Sulfa antibiotics, Benzodiazepines, Lidocaine, Oxycodone-acetaminophen, Trazodone, Cephalexin, Gabapentin, Lipitor [atorvastatin], and Lyrica [pregabalin]   Social History   Tobacco Use   Smoking status: Former    Pack years: 0.00   Smokeless tobacco: Never  Vaping Use   Vaping Use: Never used  Substance Use Topics   Alcohol use: Not Currently   Drug use: Never    Family History: family history includes Heart attack in her mother; Stroke in her father.  ROS:   Please see the history of present illness.  Additional pertinent ROS: Constitutional: Negative for chills, fever, night sweats, unintentional weight loss  HENT: Negative for ear pain and hearing loss.   Eyes: Positive for tearful. Negative for loss of vision and eye pain.  Respiratory: Positive for shortness of breath, sputum. Negative for cough, wheezing.   Cardiovascular: See HPI. Gastrointestinal: Negative for abdominal pain, melena, and hematochezia.  Genitourinary: Positive for malodorous urine. Negative for dysuria and hematuria.  Musculoskeletal: Positive for LE edema in right ankle. Negative for falls and myalgias.  Skin: Negative for itching and rash.  Neurological: Positive for stress, anxiety. Negative for focal weakness, focal sensory changes and loss of consciousness.  Endo/Heme/Allergies: Does not bruise/bleed easily.     EKGs/Labs/Other Studies Reviewed:    The following studies were reviewed today:  US LE Venous 12/12/2019: RIGHT:  - There is no evidence of deep vein thrombosis in the lower extremity.  - No cystic structure found in the popliteal fossa.     LEFT:  - No evidence of common femoral vein obstruction.  Echo 11/07/2019:  1. Left ventricular ejection fraction, by estimation, is 60 to 65%. The  left  ventricle has normal function. The left ventricle has no regional  wall motion abnormalities. There is mild asymmetric left ventricular  hypertrophy of the basal-septal segment.  Left ventricular diastolic parameters are consistent with Grade I  diastolic dysfunction (impaired relaxation). Elevated left ventricular  end-diastolic pressure.   2. Right ventricular systolic function is normal. The right ventricular  size is normal.   3. Left atrial size was moderately dilated.   4. The mitral valve is normal in structure. Trivial mitral valve  regurgitation. No evidence of mitral stenosis. Moderate mitral annular  calcification.   5. The aortic valve is tricuspid. There is mild calcification of the  aortic valve. There is mild thickening of the aortic valve. Aortic valve  regurgitation is not visualized. Mild aortic valve sclerosis is present,  with no evidence of aortic valve  stenosis.   6. The inferior vena cava is normal in size with greater than 50%  respiratory variability, suggesting right atrial pressure of 3 mmHg.   7. Agitated saline contrast bubble study was negative, with no evidence  of any interatrial shunt.   Comparison(s): No prior Echocardiogram.   Conclusion(s)/Recommendation(s): Normal biventricular function without  evidence of hemodynamically significant valvular heart disease. No  intracardiac source of embolism detected on this transthoracic study. A  transesophageal echocardiogram is  recommended to exclude  cardiac source of embolism if clinically indicated.   US Carotid Duplex 11/02/2019: Summary:  Right Carotid: Velocities in the right ICA are consistent with a 1-39%  stenosis.   Left Carotid: Velocities in the left ICA are consistent with a 80-99%  stenosis.   Vertebrals:  Bilateral vertebral arteries demonstrate antegrade flow.  Subclavians: Normal flow hemodynamics were seen in bilateral subclavian arteries.   EKG:  EKG is personally reviewed.    06/18/2020: sinus bradycardia at 59 bpm, nonspecific t wave pattern  Recent Labs: 06/27/2020: ALT 10; BUN 33; Creatinine, Ser 1.79; Hemoglobin 9.5; Magnesium 1.9; Platelets 297; Potassium 4.5; Sodium 139  Recent Lipid Panel    Component Value Date/Time   CHOL 173 11/08/2019 0406   TRIG 82 11/08/2019 0406   HDL 36 (L) 11/08/2019 0406   CHOLHDL 4.8 11/08/2019 0406   VLDL 16 11/08/2019 0406   LDLCALC 121 (H) 11/08/2019 0406    Physical Exam:    VS:  BP (!) 182/74 (BP Location: Right Arm, Patient Position: Sitting)   Ht 5' (1.524 m)   Wt 129 lb 12.8 oz (58.9 kg)   SpO2 98%   BMI 25.35 kg/m     Wt Readings from Last 3 Encounters:  06/26/20 136 lb 3.9 oz (61.8 kg)  06/18/20 129 lb 12.8 oz (58.9 kg)  05/22/20 130 lb (59 kg)    GEN: Tearful. Well nourished, well developed in no acute distress HEENT: Normal, moist mucous membranes NECK: No JVD CARDIAC: regular rhythm, normal S1 and S2, no rubs or gallops. No murmur. VASCULAR: Radial and DP pulses 2+ bilaterally. +left carotid bruit RESPIRATORY:  Clear to auscultation without wheezing or rhonchi. Slight rales at right base ABDOMEN: Soft, non-tender, non-distended MUSCULOSKELETAL:  Ambulates independently SKIN: Warm and dry, +right LE edema NEUROLOGIC:  Alert and oriented x 3. No focal neuro deficits noted. PSYCHIATRIC:  Normal affect    ASSESSMENT:    1. History of CVA (cerebrovascular accident)   2. Stenosis of left carotid artery   3. Pure hypercholesterolemia   4. Essential hypertension   5. Type 2 diabetes mellitus with complication, without long-term current use of insulin (HCC)   6. Stage 3b chronic kidney disease (HCC)   7. Localized edema    PLAN:    History of CVA Carotid stenosis, left Hypercholesterolemia -continue clopidogrel -she is not on a statin. Reports intolerance to at least atorvastatin. We discussed other statins and non-statin medication.  -last LDL 121, goal <70 -after shared decision making,  will start rosuvastatin, will gradually increase as tolerated  Hypertension: -elevated today, with history of significant elevations in the past -increase carvedilol from once daily to twice daily (discussed that it is not effective as a once a day medication) -starting lasix, see below -discussed home blood pressure monitoring  Type II diabetes Chronic kidney disease, stage 3b (GFR 39) Former smoker Right lower extremity edema, R lung rales -will start lasix 40 mg daily for 3 days, then can use as needed for leg swelling -on metformin. With frequent UTIs, SGLT2i not ideal. Could consider GLP1RA, though she has had issues with lack of appetite/weight loss, so would not start at this time.  Cardiac risk counseling and prevention recommendations: -recommend heart healthy/Mediterranean diet, with whole grains, fruits, vegetable, fish, lean meats, nuts, and olive oil. Limit salt. -recommend moderate walking, 3-5 times/week for 30-50 minutes each session. Aim for at least 150 minutes.week. Goal should be pace of 3 miles/hours, or walking 1.5 miles in 30 minutes -recommend avoidance of  tobacco products. Avoid excess alcohol. -ASCVD risk score: The ASCVD Risk score Denman George DC Jr., et al., 2013) failed to calculate for the following reasons:   The patient has a prior MI or stroke diagnosis    Plan for follow up: 1 month or sooner as needed.  Overall they have had a difficult experience at hospitals and are hesitant to return to ERs. We discussed outlining preferences for management in the future, as well as discussing what needs urgent medical attention.   Total time of encounter: 61 minutes total time of encounter, including 56 minutes spent in face-to-face patient care. This time includes coordination of care and counseling regarding medical management of stroke and edema. Remainder of non-face-to-face time involved reviewing chart documents/testing relevant to the patient encounter and  documentation in the medical record.  Jodelle Red, MD, PhD, [facility]   [facility]    Medication Adjustments/Labs and Tests Ordered: Current medicines are reviewed at length with the patient today.  Concerns regarding medicines are outlined above.  Orders Placed This Encounter  Procedures   EKG 12-Lead   Meds ordered this encounter  Medications   rosuvastatin (CRESTOR) 5 MG tablet    Sig: Take 5 mg every other day for 2 weeks, then increase to 5 mg daily.    Dispense:  90 tablet    Refill:  3   furosemide (LASIX) 40 MG tablet    Sig: Take 1 tablet (40 mg total) by mouth as needed (swelling).    Dispense:  60 tablet    Refill:  3   carvedilol (COREG) 3.125 MG tablet    Sig: Take 1 tablet (3.125 mg total) by mouth 2 (two) times daily with a meal.    Dispense:  180 tablet    Refill:  3    Patient Instructions  Medication Instructions:  Increase Carvedilol 3.125 mg to twice a day Start: Rosuvastatin 5 mg every other day for 2 weeks, then increase to daily. Start: Lasix 40 mg daily for 3 days, then just as needed for swelling.    *If you need a refill on your cardiac medications before your next appointment, please call your pharmacy*   Lab Work: None ordered today   Testing/Procedures: None ordered today   Follow-Up: At [facility], you and your health needs are our priority.  As part of our continuing mission to provide you with exceptional heart care, we have created designated Provider Care Teams.  These Care Teams include your primary Cardiologist (physician) and Advanced Practice Providers (APPs -  Physician Assistants and Nurse Practitioners) who all work together to provide you with the care you need, when you need it.  We recommend signing up for the patient portal called "MyChart".  Sign up information is provided on this After Visit Summary.  MyChart is used to connect with patients for Virtual Visits (Telemedicine).  Patients are able to  view lab/test results, encounter notes, upcoming appointments, etc.  Non-urgent messages can be sent to your provider as well.   To learn more about what you can do with MyChart, go to [email or web address].    Your next appointment:   4 week(s) @ [address] [place], [place] [address]   The format for your next appointment:   In Person  Provider:   Jodelle Red, MD  Tips to Measure your Blood Pressure Correctly Here's what you can do to ensure a correct reading:  Don't drink a caffeinated beverage or smoke during the 30 minutes before  the test.  Sit quietly for five minutes before the test begins.  During the measurement, sit in a chair with your feet on the floor and your arm supported so your elbow is at about heart level.  The inflatable part of the cuff should completely cover at least 80% of your upper arm, and the cuff should be placed on bare skin, not over a shirt.  Don't talk during the measurement.  Have your blood pressure measured twice, with a brief break in between. If the readings are different by 5 points or more, have it done a third time.  Blood pressure categories  Blood pressure category SYSTOLIC (upper number)  DIASTOLIC (lower number)  Normal Less than 120 mm Hg and Less than 80 mm Hg  Elevated 120-129 mm Hg and Less than 80 mm Hg  High blood pressure: Stage 1 hypertension 130-139 mm Hg or 80-89 mm Hg  High blood pressure: Stage 2 hypertension 140 mm Hg or higher or 90 mm Hg or higher  Hypertensive crisis (consult your doctor immediately) Higher than 180 mm Hg and/or Higher than 120 mm Hg  Source: American Heart Association and American Stroke Association. For more on getting your blood pressure under control, buy Controlling Your Blood Pressure, a Special Health Report from [facility].   Blood Pressure Log   Date   Time  Blood Pressure  Position  Example: Nov 1 9 AM 124/78 sitting                                                        I,Mathew Stumpf,acting as a Neurosurgeon for [employer], MD.,have documented all relevant documentation on the behalf of Jodelle Red, MD,as directed by  Jodelle Red, MD while in the presence of Jodelle Red, MD.  I, Jodelle Red, MD, have reviewed all documentation for this visit. The documentation on 07/10/20 for the exam, diagnosis, procedures, and orders are all accurate and complete.   Signed, Jodelle Red, MD PhD 07/10/2020 8:36 PM    [facility]

## 2020-06-18 ENCOUNTER — Encounter: Payer: Self-pay | Admitting: Cardiology

## 2020-06-18 ENCOUNTER — Ambulatory Visit: Payer: PPO | Admitting: Cardiology

## 2020-06-18 ENCOUNTER — Other Ambulatory Visit: Payer: Self-pay

## 2020-06-18 VITALS — BP 182/74 | Ht 60.0 in | Wt 129.8 lb

## 2020-06-18 DIAGNOSIS — I6522 Occlusion and stenosis of left carotid artery: Secondary | ICD-10-CM

## 2020-06-18 DIAGNOSIS — E7800 Pure hypercholesterolemia, unspecified: Secondary | ICD-10-CM

## 2020-06-18 DIAGNOSIS — Z8673 Personal history of transient ischemic attack (TIA), and cerebral infarction without residual deficits: Secondary | ICD-10-CM

## 2020-06-18 DIAGNOSIS — E118 Type 2 diabetes mellitus with unspecified complications: Secondary | ICD-10-CM

## 2020-06-18 DIAGNOSIS — R600 Localized edema: Secondary | ICD-10-CM

## 2020-06-18 DIAGNOSIS — N1832 Chronic kidney disease, stage 3b: Secondary | ICD-10-CM | POA: Diagnosis not present

## 2020-06-18 DIAGNOSIS — I10 Essential (primary) hypertension: Secondary | ICD-10-CM

## 2020-06-18 MED ORDER — CARVEDILOL 3.125 MG PO TABS
3.1250 mg | ORAL_TABLET | Freq: Two times a day (BID) | ORAL | 3 refills | Status: DC
Start: 2020-06-18 — End: 2021-06-25

## 2020-06-18 MED ORDER — ROSUVASTATIN CALCIUM 5 MG PO TABS: ORAL_TABLET | ORAL | 3 refills | Status: DC

## 2020-06-18 MED ORDER — FUROSEMIDE 40 MG PO TABS: 40.0000 mg | ORAL_TABLET | ORAL | 3 refills | Status: DC | PRN

## 2020-06-18 NOTE — Patient Instructions (Addendum)
Medication Instructions:  Increase Carvedilol 3.125 mg to twice a day Start: Rosuvastatin 5 mg every other day for 2 weeks, then increase to daily. Start: Lasix 40 mg daily for 3 days, then just as needed for swelling.    *If you need a refill on your cardiac medications before your next appointment, please call your pharmacy*   Lab Work: None ordered today   Testing/Procedures: None ordered today   Follow-Up: At [facility], you and your health needs are our priority.  As part of our continuing mission to provide you with exceptional heart care, we have created designated Provider Care Teams.  These Care Teams include your primary Cardiologist (physician) and Advanced Practice Providers (APPs -  Physician Assistants and Nurse Practitioners) who all work together to provide you with the care you need, when you need it.  We recommend signing up for the patient portal called "MyChart".  Sign up information is provided on this After Visit Summary.  MyChart is used to connect with patients for Virtual Visits (Telemedicine).  Patients are able to view lab/test results, encounter notes, upcoming appointments, etc.  Non-urgent messages can be sent to your provider as well.   To learn more about what you can do with MyChart, go to [email or web address].    Your next appointment:   4 week(s) @ [address] [place], [place] [address]   The format for your next appointment:   In Person  Provider:   Jodelle Red, MD  Tips to Measure your Blood Pressure Correctly Here's what you can do to ensure a correct reading: . Don't drink a caffeinated beverage or smoke during the 30 minutes before the test. . Sit quietly for five minutes before the test begins. . During the measurement, sit in a chair with your feet on the floor and your arm supported so your elbow is at about heart level. . The inflatable part of the cuff should completely cover at least 80% of your upper  arm, and the cuff should be placed on bare skin, not over a shirt. . Don't talk during the measurement. . Have your blood pressure measured twice, with a brief break in between. If the readings are different by 5 points or more, have it done a third time.  Blood pressure categories  Blood pressure category SYSTOLIC (upper number)  DIASTOLIC (lower number)  Normal Less than 120 mm Hg and Less than 80 mm Hg  Elevated 120-129 mm Hg and Less than 80 mm Hg  High blood pressure: Stage 1 hypertension 130-139 mm Hg or 80-89 mm Hg  High blood pressure: Stage 2 hypertension 140 mm Hg or higher or 90 mm Hg or higher  Hypertensive crisis (consult your doctor immediately) Higher than 180 mm Hg and/or Higher than 120 mm Hg  Source: American Heart Association and American Stroke Association. For more on getting your blood pressure under control, buy Controlling Your Blood Pressure, a Special Health Report from [facility].   Blood Pressure Log   Date   Time  Blood Pressure  Position  Example: Nov 1 9 AM 124/78 sitting

## 2020-06-25 ENCOUNTER — Inpatient Hospital Stay (HOSPITAL_COMMUNITY)
Admission: EM | Admit: 2020-06-25 | Discharge: 2020-06-27 | DRG: 193 | Disposition: A | Discharge status: Home Health | Payer: PPO | Attending: Internal Medicine | Admitting: Internal Medicine

## 2020-06-25 ENCOUNTER — Encounter (HOSPITAL_COMMUNITY): Payer: Self-pay

## 2020-06-25 ENCOUNTER — Other Ambulatory Visit: Payer: Self-pay

## 2020-06-25 ENCOUNTER — Emergency Department (HOSPITAL_COMMUNITY): Payer: PPO

## 2020-06-25 DIAGNOSIS — N183 Chronic kidney disease, stage 3 unspecified: Secondary | ICD-10-CM

## 2020-06-25 DIAGNOSIS — E1122 Type 2 diabetes mellitus with diabetic chronic kidney disease: Secondary | ICD-10-CM | POA: Diagnosis present

## 2020-06-25 DIAGNOSIS — J440 Chronic obstructive pulmonary disease with acute lower respiratory infection: Secondary | ICD-10-CM | POA: Diagnosis present

## 2020-06-25 DIAGNOSIS — Z8673 Personal history of transient ischemic attack (TIA), and cerebral infarction without residual deficits: Secondary | ICD-10-CM

## 2020-06-25 DIAGNOSIS — Z87891 Personal history of nicotine dependence: Secondary | ICD-10-CM | POA: Diagnosis not present

## 2020-06-25 DIAGNOSIS — R410 Disorientation, unspecified: Secondary | ICD-10-CM | POA: Diagnosis not present

## 2020-06-25 DIAGNOSIS — J9811 Atelectasis: Secondary | ICD-10-CM | POA: Diagnosis not present

## 2020-06-25 DIAGNOSIS — Z66 Do not resuscitate: Secondary | ICD-10-CM | POA: Diagnosis not present

## 2020-06-25 DIAGNOSIS — Z20822 Contact with and (suspected) exposure to covid-19: Secondary | ICD-10-CM | POA: Diagnosis present

## 2020-06-25 DIAGNOSIS — J189 Pneumonia, unspecified organism: Principal | ICD-10-CM | POA: Diagnosis present

## 2020-06-25 DIAGNOSIS — N1831 Chronic kidney disease, stage 3a: Secondary | ICD-10-CM | POA: Diagnosis not present

## 2020-06-25 DIAGNOSIS — R509 Fever, unspecified: Secondary | ICD-10-CM | POA: Diagnosis not present

## 2020-06-25 DIAGNOSIS — Z79899 Other long term (current) drug therapy: Secondary | ICD-10-CM

## 2020-06-25 DIAGNOSIS — J9601 Acute respiratory failure with hypoxia: Secondary | ICD-10-CM | POA: Diagnosis not present

## 2020-06-25 DIAGNOSIS — I129 Hypertensive chronic kidney disease with stage 1 through stage 4 chronic kidney disease, or unspecified chronic kidney disease: Secondary | ICD-10-CM | POA: Diagnosis present

## 2020-06-25 DIAGNOSIS — R0602 Shortness of breath: Secondary | ICD-10-CM | POA: Diagnosis not present

## 2020-06-25 DIAGNOSIS — N179 Acute kidney failure, unspecified: Secondary | ICD-10-CM | POA: Diagnosis present

## 2020-06-25 DIAGNOSIS — D631 Anemia in chronic kidney disease: Secondary | ICD-10-CM | POA: Diagnosis present

## 2020-06-25 DIAGNOSIS — Z7984 Long term (current) use of oral hypoglycemic drugs: Secondary | ICD-10-CM

## 2020-06-25 DIAGNOSIS — N390 Urinary tract infection, site not specified: Secondary | ICD-10-CM | POA: Diagnosis present

## 2020-06-25 DIAGNOSIS — D638 Anemia in other chronic diseases classified elsewhere: Secondary | ICD-10-CM | POA: Diagnosis not present

## 2020-06-25 DIAGNOSIS — I10 Essential (primary) hypertension: Secondary | ICD-10-CM | POA: Diagnosis present

## 2020-06-25 DIAGNOSIS — R3989 Other symptoms and signs involving the genitourinary system: Secondary | ICD-10-CM | POA: Diagnosis not present

## 2020-06-25 DIAGNOSIS — N1832 Chronic kidney disease, stage 3b: Secondary | ICD-10-CM | POA: Diagnosis present

## 2020-06-25 DIAGNOSIS — E440 Moderate protein-calorie malnutrition: Secondary | ICD-10-CM | POA: Diagnosis present

## 2020-06-25 DIAGNOSIS — Z8249 Family history of ischemic heart disease and other diseases of the circulatory system: Secondary | ICD-10-CM | POA: Diagnosis not present

## 2020-06-25 DIAGNOSIS — N184 Chronic kidney disease, stage 4 (severe): Secondary | ICD-10-CM | POA: Diagnosis present

## 2020-06-25 DIAGNOSIS — Z7902 Long term (current) use of antithrombotics/antiplatelets: Secondary | ICD-10-CM | POA: Diagnosis not present

## 2020-06-25 DIAGNOSIS — D72828 Other elevated white blood cell count: Secondary | ICD-10-CM

## 2020-06-25 DIAGNOSIS — R531 Weakness: Secondary | ICD-10-CM

## 2020-06-25 DIAGNOSIS — E1165 Type 2 diabetes mellitus with hyperglycemia: Secondary | ICD-10-CM | POA: Diagnosis not present

## 2020-06-25 LAB — CBC
HCT: 34.6 % — ABNORMAL LOW (ref 36.0–46.0)
Hemoglobin: 11.4 g/dL — ABNORMAL LOW (ref 12.0–15.0)
MCH: 31.4 pg (ref 26.0–34.0)
MCHC: 32.9 g/dL (ref 30.0–36.0)
MCV: 95.3 fL (ref 80.0–100.0)
Platelets: 371 10*3/uL (ref 150–400)
RBC: 3.63 MIL/uL — ABNORMAL LOW (ref 3.87–5.11)
RDW: 13 % (ref 11.5–15.5)
WBC: 22.1 10*3/uL — ABNORMAL HIGH (ref 4.0–10.5)
nRBC: 0 % (ref 0.0–0.2)

## 2020-06-25 LAB — URINALYSIS, ROUTINE W REFLEX MICROSCOPIC
Bilirubin Urine: NEGATIVE
Glucose, UA: NEGATIVE mg/dL
Ketones, ur: NEGATIVE mg/dL
Nitrite: NEGATIVE
Protein, ur: 30 mg/dL — AB
Specific Gravity, Urine: 1.004 — ABNORMAL LOW (ref 1.005–1.030)
WBC, UA: >50 WBC/hpf — ABNORMAL HIGH (ref 0–5)
pH: 6 (ref 5.0–8.0)

## 2020-06-25 LAB — COMPREHENSIVE METABOLIC PANEL
ALT: 11 U/L (ref 0–44)
AST: 15 U/L (ref 15–41)
Albumin: 4.2 g/dL (ref 3.5–5.0)
Alkaline Phosphatase: 92 U/L (ref 38–126)
Anion gap: 9 (ref 5–15)
BUN: 27 mg/dL — ABNORMAL HIGH (ref 8–23)
CO2: 26 mmol/L (ref 22–32)
Calcium: 9.4 mg/dL (ref 8.9–10.3)
Chloride: 100 mmol/L (ref 98–111)
Creatinine, Ser: 1.65 mg/dL — ABNORMAL HIGH (ref 0.44–1.00)
GFR, Estimated: 32 mL/min — ABNORMAL LOW (ref >60)
Glucose, Bld: 179 mg/dL — ABNORMAL HIGH (ref 70–99)
Potassium: 3.9 mmol/L (ref 3.5–5.1)
Sodium: 135 mmol/L (ref 135–145)
Total Bilirubin: 0.7 mg/dL (ref 0.3–1.2)
Total Protein: 7.1 g/dL (ref 6.5–8.1)

## 2020-06-25 LAB — LACTIC ACID, PLASMA: Lactic Acid, Venous: 1.4 mmol/L (ref 0.5–1.9)

## 2020-06-25 LAB — RESP PANEL BY RT-PCR (FLU A&B, COVID) ARPGX2
Influenza A by PCR: NEGATIVE
Influenza B by PCR: NEGATIVE
SARS Coronavirus 2 by RT PCR: NEGATIVE

## 2020-06-25 MED ORDER — SODIUM CHLORIDE 0.9 % IV BOLUS
1000.0000 mL | Freq: Once | INTRAVENOUS | Status: AC
  Administered 2020-06-25: 21:00:00 1000 mL via INTRAVENOUS

## 2020-06-25 MED ORDER — SODIUM CHLORIDE 0.9 % IV SOLN
500.0000 mg | Freq: Once | INTRAVENOUS | Status: AC
  Administered 2020-06-25: 500 mg via INTRAVENOUS
  Filled 2020-06-25: qty 500

## 2020-06-25 MED ORDER — ENOXAPARIN SODIUM 30 MG/0.3ML IJ SOSY
30.0000 mg | PREFILLED_SYRINGE | INTRAMUSCULAR | Status: DC
  Administered 2020-06-25 – 2020-06-26 (×2): 30 mg via SUBCUTANEOUS
  Filled 2020-06-25 (×2): qty 0.3

## 2020-06-25 MED ORDER — LISINOPRIL 10 MG PO TABS
10.0000 mg | ORAL_TABLET | Freq: Every day | ORAL | Status: DC
  Administered 2020-06-26 – 2020-06-27 (×2): 10 mg via ORAL
  Filled 2020-06-25 (×2): qty 1

## 2020-06-25 MED ORDER — HYDRALAZINE HCL 25 MG PO TABS
25.0000 mg | ORAL_TABLET | Freq: Two times a day (BID) | ORAL | Status: DC | PRN
  Administered 2020-06-27: 25 mg via ORAL
  Filled 2020-06-25: qty 1

## 2020-06-25 MED ORDER — SODIUM CHLORIDE 0.9 % IV SOLN
500.0000 mg | INTRAVENOUS | Status: DC
  Administered 2020-06-26: 500 mg via INTRAVENOUS
  Filled 2020-06-25: qty 500

## 2020-06-25 MED ORDER — INSULIN ASPART 100 UNIT/ML IJ SOLN
0.0000 [IU] | Freq: Three times a day (TID) | INTRAMUSCULAR | Status: DC
  Filled 2020-06-25: qty 0.15

## 2020-06-25 MED ORDER — SODIUM CHLORIDE 0.9 % IV BOLUS
1000.0000 mL | Freq: Once | INTRAVENOUS | Status: AC
  Administered 2020-06-25: 1000 mL via INTRAVENOUS

## 2020-06-25 MED ORDER — CLOPIDOGREL BISULFATE 75 MG PO TABS
75.0000 mg | ORAL_TABLET | Freq: Every day | ORAL | Status: DC
  Administered 2020-06-26 – 2020-06-27 (×2): 75 mg via ORAL
  Filled 2020-06-25 (×2): qty 1

## 2020-06-25 MED ORDER — SODIUM CHLORIDE 0.9 % IV SOLN
2.0000 g | INTRAVENOUS | Status: DC
  Administered 2020-06-26: 2 g via INTRAVENOUS
  Filled 2020-06-25: qty 20
  Filled 2020-06-25: qty 2

## 2020-06-25 MED ORDER — ACETAMINOPHEN 500 MG PO TABS: 1000.0000 mg | ORAL_TABLET | Freq: Four times a day (QID) | ORAL | Status: DC | PRN

## 2020-06-25 MED ORDER — AMLODIPINE BESYLATE 10 MG PO TABS
10.0000 mg | ORAL_TABLET | Freq: Every day | ORAL | Status: DC
  Administered 2020-06-26 – 2020-06-27 (×2): 10 mg via ORAL
  Filled 2020-06-25 (×2): qty 1

## 2020-06-25 MED ORDER — POLYETHYLENE GLYCOL 3350 17 G PO PACK: 17.0000 g | PACK | ORAL | Status: DC

## 2020-06-25 MED ORDER — CARVEDILOL 3.125 MG PO TABS
3.1250 mg | ORAL_TABLET | Freq: Two times a day (BID) | ORAL | Status: DC
  Administered 2020-06-26 – 2020-06-27 (×3): 3.125 mg via ORAL
  Filled 2020-06-25 (×3): qty 1

## 2020-06-25 MED ORDER — SODIUM CHLORIDE 0.9 % IV SOLN
1.0000 g | Freq: Once | INTRAVENOUS | Status: AC
  Administered 2020-06-25: 1 g via INTRAVENOUS
  Filled 2020-06-25: qty 10

## 2020-06-25 MED ORDER — ROSUVASTATIN CALCIUM 5 MG PO TABS
5.0000 mg | ORAL_TABLET | Freq: Every day | ORAL | Status: DC
  Administered 2020-06-26 – 2020-06-27 (×2): 5 mg via ORAL
  Filled 2020-06-25 (×2): qty 1

## 2020-06-25 MED ORDER — MECLIZINE HCL 25 MG PO TABS: 25.0000 mg | ORAL_TABLET | Freq: Three times a day (TID) | ORAL | Status: DC | PRN

## 2020-06-25 NOTE — H&P (Signed)
History and Physical    Chelsea [name] PIR:[id number] DOB: 11/15/1945 DOA: 06/25/2020  PCP: Roe Rutherford, NP  Patient coming from: Home  I have personally briefly reviewed patient's old medical records in [facility]  Chief Complaint: Fever  HPI: Chelsea [name] is a 75 y.o. female with medical history significant of Stroke, DM2, HTN, CKD 3, recurrent UTIs, former smoker.  Pt with intermittent fever for past 1-2 weeks.  Symptoms persistent.  Dx with UTI.  Initially on cipro then switched to macrobid.  Symptoms persist despite 2 rounds of ABx.  UCx on 6/6 = E.Coli Sensitive to cephalosporins, resistant to Cipro, sensitive to NTX.  Pt denies dysuria.  No headache, no neck pain / stiffness, no CP.  Does have non-productive cough, recently increased form baseline.  ? Pulse ox of 82% on RA at home.  No sick contacts, no covid nor flu exposure.  Symptoms are intermittent, worsening, not improved by ABx courses.   ED Course: WBC 22k.  BLL PNA.  COVID and flu neg.  Pt given rocephin + azithro (despite listed allergy with unknown reaction to cephalosporins), thankfully pt has tolerated rocephin well without any obvious reaction.   Review of Systems: As per HPI, otherwise all review of systems negative.  Past Medical History:  Diagnosis Date   Blind left eye    Cardiac arrest [facility])    Carotid artery occlusion    Diabetes mellitus without complication (HCC)    Hypertension    Stroke [facility])     Past Surgical History:  Procedure Laterality Date   ENDARTERECTOMY Left 12/03/2019   Procedure: LEFT CAROTID ENDARTERECTOMY;  Surgeon: Chuck Hint, MD;  Location: [facility] OR;  Service: Vascular;  Laterality: Left;   PATCH ANGIOPLASTY Left 12/03/2019   Procedure: PATCH ANGIOPLASTY Left Carotid Artery;  Surgeon: Chuck Hint, MD;  Location: [facility] OR;  Service: Vascular;  Laterality: Left;     reports that she has quit smoking. She has never used smokeless tobacco. She  reports previous alcohol use. She reports that she does not use drugs.  Allergies  Allergen Reactions   Codeine Anaphylaxis and Swelling    Throat swells     Penicillins Anaphylaxis and Swelling    Throat closes     Sulfa Antibiotics Anaphylaxis, Swelling and Other (See Comments)    Lips and eye swell    Benzodiazepines Hives   Lidocaine Hives   Oxycodone-Acetaminophen Other (See Comments)    unknown     Trazodone Other (See Comments)    unknown     Cephalexin Other (See Comments)    Unknown. Tolerating rocephin 06/25/20   Gabapentin Nausea And Vomiting   Lipitor [Atorvastatin]     Vomiting    Lyrica [Pregabalin]     Vomiting     Family History  Problem Relation Age of Onset   Heart attack Mother    Stroke Father      Prior to Admission medications   Medication Sig Start Date End Date Taking? Authorizing Provider  amLODipine (NORVASC) 10 MG tablet Take 1 tablet (10 mg total) by mouth daily. 12/05/19  Yes Love, Evlyn Kanner, PA-C  clopidogrel (PLAVIX) 75 MG tablet Take 1 tablet (75 mg total) by mouth daily. 12/05/19  Yes Love, Evlyn Kanner, PA-C  hydrALAZINE (APRESOLINE) 25 MG tablet Take 25 mg by mouth 2 (two) times daily as needed (systolic >180 diastolic >100).   Yes [provider]  lisinopril (ZESTRIL) 10 MG tablet Take 10 mg by mouth daily.  Yes [provider]  meclizine (ANTIVERT) 25 MG tablet Take 25 mg by mouth 3 (three) times daily as needed for dizziness.   Yes [provider]  metFORMIN (GLUCOPHAGE) 500 MG tablet Take 1 tablet (500 mg total) by mouth 2 (two) times daily with a meal. 12/05/19  Yes Love, Evlyn KannerPamela S, PA-C  nitrofurantoin, macrocrystal-monohydrate, (MACROBID) 100 MG capsule Take 100 mg by mouth 2 (two) times daily. 06/10/20  Yes [provider]  ondansetron (ZOFRAN-ODT) 4 MG disintegrating tablet Take 4 mg by mouth every 8 (eight) hours as needed for nausea/vomiting. 12/10/19  Yes [provider]  scopolamine  (TRANSDERM-SCOP) 1 MG/3DAYS Place 1 patch (1.5 mg total) onto the skin every 3 (three) days. Starting Friday morning 12/05/19  Yes Love, Evlyn Kanneramela S, PA-C  acetaminophen (TYLENOL) 325 MG tablet Take 2 tablets (650 mg total) by mouth every 6 (six) hours as needed for mild pain (or Fever >/= 101). 12/03/19   Angiulli, Mcarthur Rossettianiel J, PA-C  artificial tears (LACRILUBE) OINT ophthalmic ointment Place into the left eye at bedtime as needed for dry eyes. 12/03/19   Angiulli, Mcarthur Rossettianiel J, PA-C  Calcium-Magnesium-Zinc (CAL-MAG-ZINC PO) Take 1 tablet by mouth daily.    [provider]  carvedilol (COREG) 3.125 MG tablet Take 1 tablet (3.125 mg total) by mouth 2 (two) times daily with a meal. Patient taking differently: Take 3.125 mg by mouth daily. 06/18/20   Jodelle Redhristopher, Bridgette, MD  ciprofloxacin (CIPRO) 250 MG tablet Take 250 mg by mouth 2 (two) times daily. 06/20/20 06/30/20  [provider]  furosemide (LASIX) 40 MG tablet Take 1 tablet (40 mg total) by mouth as needed (swelling). Patient taking differently: Take 40 mg by mouth daily as needed for edema. 06/18/20 09/16/20  Jodelle Redhristopher, Bridgette, MD  ketorolac (ACULAR) 0.5 % ophthalmic solution SMARTSIG:In Eye(s) 04/03/20   [provider]  metroNIDAZOLE (METROGEL) 0.75 % vaginal gel Place vaginally 2 (two) times daily. 05/05/20   [provider]  ofloxacin (OCUFLOX) 0.3 % ophthalmic solution SMARTSIG:In Eye(s) 04/03/20   [provider]  polyethylene glycol (MIRALAX / GLYCOLAX) 17 g packet Take 17 g by mouth daily. 12/05/19   Love, Evlyn KannerPamela S, PA-C  pregabalin (LYRICA) 50 MG capsule Take 1 capsule (50 mg total) by mouth 3 (three) times daily. 02/21/20   Micki RileySethi, Pramod S, MD  rosuvastatin (CRESTOR) 5 MG tablet Take 5 mg every other day for 2 weeks, then increase to 5 mg daily. 06/18/20   Jodelle Redhristopher, Bridgette, MD    Physical Exam: Vitals:   06/25/20 1900 06/25/20 2000 06/25/20 2030 06/25/20 2100  BP: (!) 168/68 (!) 169/71 (!)  152/80 (!) 181/67  Pulse: (!) 56 (!) 57 61 61  Resp: 15 13 18 16   Temp:      TempSrc:      SpO2: 98% 92% 97% 96%    Constitutional: NAD, calm, comfortable Eyes: PERRL, lids and conjunctivae normal ENMT: Mucous membranes are moist. Posterior pharynx clear of any exudate or lesions.Normal dentition.  Neck: normal, supple, no masses, no thyromegaly Respiratory: BLL rhonchi Cardiovascular: Regular rate and rhythm, no murmurs / rubs / gallops. No extremity edema. 2+ pedal pulses. No carotid bruits.  Abdomen: no tenderness, no masses palpated. No hepatosplenomegaly. Bowel sounds positive.  Musculoskeletal: no clubbing / cyanosis. No joint deformity upper and lower extremities. Good ROM, no contractures. Normal muscle tone.  Skin: no rashes, lesions, ulcers. No induration Neurologic: CN 2-12 grossly intact. Sensation intact, DTR normal. Strength 5/5 in all 4.  Psychiatric: Normal  judgment and insight. Alert and oriented x 3. Normal mood.    Labs on Admission: I have personally reviewed following labs and imaging studies  CBC: Recent Labs  Lab 06/25/20 1514  WBC 22.1*  HGB 11.4*  HCT 34.6*  MCV 95.3  PLT 371   Basic Metabolic Panel: Recent Labs  Lab 06/25/20 1514  NA 135  K 3.9  CL 100  CO2 26  GLUCOSE 179*  BUN 27*  CREATININE 1.65*  CALCIUM 9.4   GFR: Estimated Creatinine Clearance: 23.3 mL/min (A) (by C-G formula based on SCr of 1.65 mg/dL (H)). Liver Function Tests: Recent Labs  Lab 06/25/20 1514  AST 15  ALT 11  ALKPHOS 92  BILITOT 0.7  PROT 7.1  ALBUMIN 4.2   No results for input(s): LIPASE, AMYLASE in the last 168 hours. No results for input(s): AMMONIA in the last 168 hours. Coagulation Profile: No results for input(s): INR, PROTIME in the last 168 hours. Cardiac Enzymes: No results for input(s): CKTOTAL, CKMB, CKMBINDEX, TROPONINI in the last 168 hours. BNP (last 3 results) No results for input(s): PROBNP in the last 8760 hours. HbA1C: No results  for input(s): HGBA1C in the last 72 hours. CBG: No results for input(s): GLUCAP in the last 168 hours. Lipid Profile: No results for input(s): CHOL, HDL, LDLCALC, TRIG, CHOLHDL, LDLDIRECT in the last 72 hours. Thyroid Function Tests: No results for input(s): TSH, T4TOTAL, FREET4, T3FREE, THYROIDAB in the last 72 hours. Anemia Panel: No results for input(s): VITAMINB12, FOLATE, FERRITIN, TIBC, IRON, RETICCTPCT in the last 72 hours. Urine analysis:    Component Value Date/Time   COLORURINE YELLOW 11/28/2019 1716   APPEARANCEUR CLOUDY (A) 11/28/2019 1716   LABSPEC 1.009 11/28/2019 1716   PHURINE 5.0 11/28/2019 1716   GLUCOSEU NEGATIVE 11/28/2019 1716   HGBUR NEGATIVE 11/28/2019 1716   BILIRUBINUR NEGATIVE 11/28/2019 1716   KETONESUR NEGATIVE 11/28/2019 1716   PROTEINUR 30 (A) 11/28/2019 1716   UROBILINOGEN 0.2 09/17/2008 1929   NITRITE NEGATIVE 11/28/2019 1716   LEUKOCYTESUR LARGE (A) 11/28/2019 1716    Radiological Exams on Admission: DG Chest Port 1 View  Result Date: 06/25/2020 CLINICAL DATA:  Fever. EXAM: PORTABLE CHEST 1 VIEW COMPARISON:  December 12, 2019 FINDINGS: Cardiomediastinal silhouette is normal. Mediastinal contours appear intact. Calcific atherosclerotic disease of the aorta. There is no evidence of focal airspace consolidation, pleural effusion or pneumothorax. Streaky airspace opacities in the lower lobes may represent atelectasis or scarring. Osseous structures are without acute abnormality. Soft tissues are grossly normal. IMPRESSION: Streaky airspace opacities in the lower lobes may represent atelectasis or scarring. Electronically Signed   By: Ted Mcalpine M.D.   On: 06/25/2020 15:12    EKG: Independently reviewed.  Assessment/Plan Principal Problem:   CAP (community acquired pneumonia) Active Problems:   Uncontrolled type 2 diabetes mellitus with hyperglycemia, without long-term current use of insulin (HCC)   Primary hypertension   CKD (chronic kidney  disease) stage 3, GFR 30-59 ml/min (HCC)    CAP - BLL PNA Didn't respond to cipro course PNA pathway Despite history of allergy to 'cephalosporins' in chart with 'unknown' reaction.  Pt tolerated rocephin in ED without any reaction or apparent side effects. Therefore will continue rocephin + azithromycin for now BCx pending Repeat labs in AM Will hold Lasix CKD 3 - Pt with known CKD 3, suspect due to long term uncontrolled HTN Suspect creat 1.6 today is at or near baseline Repeat BMP in AM DM2 - Hold home metformin Mod scale SSI  AC HTN - Cont home BP meds  DVT prophylaxis: Lovenox Code Status: Full Family Communication: daughter at bedside Disposition Plan: Home after treatment for PNA Consults called: None Admission status: Place in 66   Genesia Caslin M. DO [facility]  How to contact the [facility] Attending or Consulting provider 7A - 7P or covering provider during after hours 7P -7A, for this patient?  Check the care team in [facility] and look for a) attending/consulting [facility] provider listed and b) the [facility] team listed Log into [email or web address]  Amion Physician Scheduling and messaging for groups and whole hospitals  On call and physician scheduling software for group practices, residents, hospitalists and other medical providers for call, clinic, rotation and shift schedules. OnCall Enterprise is a hospital-wide system for scheduling doctors and paging doctors on call. EasyPlot is for scientific plotting and data analysis.  [email or web address]  and use [place]'s universal password to access. If you do not have the password, please contact the hospital operator.  Locate the [facility] provider you are looking for under [facility] and page to a number that you can be directly reached. If you still have difficulty reaching the provider, please page the Operating Room Services (Director on Call) for the Hospitalists listed on amion for assistance.  06/25/2020, 10:00 PM

## 2020-06-25 NOTE — ED Notes (Signed)
[facility] will give urine sample when able. 

## 2020-06-25 NOTE — ED Notes (Signed)
ED TO INPATIENT HANDOFF REPORT  ED Nurse Name and Phone #: [phone], Andreas Ohm Name/Age/Gender Chelsea [name] 75 y.o. female Room/Bed: WA15/WA15  Code Status   Code Status: Full Code  Home/SNF/Other Home Patient oriented to: self, place and situation Is this baseline? Yes   Triage Complete: Triage complete  Chief Complaint CAP (community acquired pneumonia) [J18.9]  Triage Note Pt arrived via walk in, c/o ongoing UTI, per daughter, pt has been more confused, hypoxic at home to 82%, worsening weakness and fever (up to 101) and chills. Has had recent abx, finished with little improvement.     Allergies Allergies  Allergen Reactions  . Codeine Anaphylaxis and Swelling    Throat swells    . Penicillins Anaphylaxis and Swelling    Throat closes    . Sulfa Antibiotics Anaphylaxis, Swelling and Other (See Comments)    Lips and eye swell   . Benzodiazepines Hives  . Lidocaine Hives  . Oxycodone-Acetaminophen Other (See Comments)    unknown    . Trazodone Other (See Comments)    unknown    . Cephalexin Other (See Comments)    Unknown. Tolerating rocephin 06/25/20  . Gabapentin Nausea And Vomiting  . Lipitor [Atorvastatin]     Vomiting   . Lyrica [Pregabalin]     Vomiting     Level of Care/Admitting Diagnosis ED Disposition    ED Disposition  Admit   Condition  --   Comment  Hospital Area: [facility] [[id number]]  Level of Care: Med-Surg [16]  May place patient in observation at [facility] or [facility] if equivalent level of care is available:: No  Covid Evaluation: Confirmed COVID Negative  Diagnosis: CAP (community acquired pneumonia) [841324]  Admitting Physician: Hillary Bow [phone]  Attending Physician: Hillary Bow [phone]         B Medical/Surgery History Past Medical History:  Diagnosis Date  . Blind left eye   . Cardiac arrest (HCC)   . Carotid artery occlusion   . Diabetes mellitus without complication  (HCC)   . Hypertension   . Stroke [facility])    Past Surgical History:  Procedure Laterality Date  . ENDARTERECTOMY Left 12/03/2019   Procedure: LEFT CAROTID ENDARTERECTOMY;  Surgeon: Chuck Hint, MD;  Location: [facility] OR;  Service: Vascular;  Laterality: Left;  . PATCH ANGIOPLASTY Left 12/03/2019   Procedure: PATCH ANGIOPLASTY Left Carotid Artery;  Surgeon: Chuck Hint, MD;  Location: [facility] OR;  Service: Vascular;  Laterality: Left;     A IV Location/Drains/Wounds Patient Lines/Drains/Airways Status    Active Line/Drains/Airways    Name Placement date Placement time Site Days   Peripheral IV 06/25/20 20 G Left Antecubital 06/25/20  1852  Antecubital  less than 1   Incision (Closed) 12/03/19 Neck Left 12/03/19  0940  -- 205          Intake/Output Last 24 hours  Intake/Output Summary (Last 24 hours) at 06/25/2020 2216 Last data filed at 06/25/2020 2054 Gross per 24 hour  Intake 1100 ml  Output --  Net 1100 ml    Labs/Imaging Results for orders placed or performed during the hospital encounter of 06/25/20 (from the past 48 hour(s))  Resp Panel by RT-PCR (Flu A&B, Covid) Nasopharyngeal Swab     Status: None   Collection Time: 06/25/20  2:39 PM   Specimen: Nasopharyngeal Swab; Nasopharyngeal(NP) swabs in vial transport medium  Result Value Ref Range   SARS Coronavirus 2 by RT PCR  NEGATIVE NEGATIVE    Comment: (NOTE) SARS-CoV-2 target nucleic acids are NOT DETECTED.  The SARS-CoV-2 RNA is generally detectable in upper respiratory specimens during the acute phase of infection. The lowest concentration of SARS-CoV-2 viral copies this assay can detect is 138 copies/mL. A negative result does not preclude SARS-Cov-2 infection and should not be used as the sole basis for treatment or other patient management decisions. A negative result may occur with  improper specimen collection/handling, submission of specimen other than nasopharyngeal swab, presence of viral  mutation(s) within the areas targeted by this assay, and inadequate number of viral copies(<138 copies/mL). A negative result must be combined with clinical observations, patient history, and epidemiological information. The expected result is Negative.  Fact Sheet for Patients:  BloggerCourse.com  Fact Sheet for Healthcare Providers:  SeriousBroker.it  This test is no t yet approved or cleared by the [place] FDA and  has been authorized for detection and/or diagnosis of SARS-CoV-2 by FDA under an Emergency Use Authorization (EUA). This EUA will remain  in effect (meaning this test can be used) for the duration of the COVID-19 declaration under Section 564(b)(1) of the Act, 21 U.S.C.section 360bbb-3(b)(1), unless the authorization is terminated  or revoked sooner.       Influenza A by PCR NEGATIVE NEGATIVE   Influenza B by PCR NEGATIVE NEGATIVE    Comment: (NOTE) The Xpert Xpress SARS-CoV-2/FLU/RSV plus assay is intended as an aid in the diagnosis of influenza from Nasopharyngeal swab specimens and should not be used as a sole basis for treatment. Nasal washings and aspirates are unacceptable for Xpert Xpress SARS-CoV-2/FLU/RSV testing.  Fact Sheet for Patients: BloggerCourse.com  Fact Sheet for Healthcare Providers: SeriousBroker.it  This test is not yet approved or cleared by the [place] FDA and has been authorized for detection and/or diagnosis of SARS-CoV-2 by FDA under an Emergency Use Authorization (EUA). This EUA will remain in effect (meaning this test can be used) for the duration of the COVID-19 declaration under Section 564(b)(1) of the Act, 21 U.S.C. section 360bbb-3(b)(1), unless the authorization is terminated or revoked.  Performed at [facility], [address], [place], [place] [address]   CBC     Status: Abnormal    Collection Time: 06/25/20  3:14 PM  Result Value Ref Range   WBC 22.1 (H) 4.0 - 10.5 K/uL   RBC 3.63 (L) 3.87 - 5.11 MIL/uL   Hemoglobin 11.4 (L) 12.0 - 15.0 g/dL   HCT 91.6 (L) 38.4 - 66.5 %   MCV 95.3 80.0 - 100.0 fL   MCH 31.4 26.0 - 34.0 pg   MCHC 32.9 30.0 - 36.0 g/dL   RDW 99.3 57.0 - 17.7 %   Platelets 371 150 - 400 K/uL   nRBC 0.0 0.0 - 0.2 %    Comment: Performed at [facility], [address] [place], [place] [address]  Comprehensive metabolic panel     Status: Abnormal   Collection Time: 06/25/20  3:14 PM  Result Value Ref Range   Sodium 135 135 - 145 mmol/L   Potassium 3.9 3.5 - 5.1 mmol/L   Chloride 100 98 - 111 mmol/L   CO2 26 22 - 32 mmol/L   Glucose, Bld 179 (H) 70 - 99 mg/dL    Comment: Glucose reference range applies only to samples taken after fasting for at least 8 hours.   BUN 27 (H) 8 - 23 mg/dL   Creatinine, Ser 0.09 (H) 0.44 - 1.00 mg/dL  Calcium 9.4 8.9 - 10.3 mg/dL   Total Protein 7.1 6.5 - 8.1 g/dL   Albumin 4.2 3.5 - 5.0 g/dL   AST 15 15 - 41 U/L   ALT 11 0 - 44 U/L   Alkaline Phosphatase 92 38 - 126 U/L   Total Bilirubin 0.7 0.3 - 1.2 mg/dL   GFR, Estimated 32 (L) >60 mL/min    Comment: (NOTE) Calculated using the CKD-EPI Creatinine Equation (2021)    Anion gap 9 5 - 15    Comment: Performed at [facility], [address] [place], [place] [address]  Lactic acid, plasma     Status: None   Collection Time: 06/25/20  3:14 PM  Result Value Ref Range   Lactic Acid, Venous 1.4 0.5 - 1.9 mmol/L    Comment: Performed at [facility], [address], [place], [place] [address]   DG Chest Port 1 View  Result Date: 06/25/2020 CLINICAL DATA:  Fever. EXAM: PORTABLE CHEST 1 VIEW COMPARISON:  December 12, 2019 FINDINGS: Cardiomediastinal silhouette is normal. Mediastinal contours appear intact. Calcific atherosclerotic disease of the aorta. There is no evidence of focal airspace consolidation,  pleural effusion or pneumothorax. Streaky airspace opacities in the lower lobes may represent atelectasis or scarring. Osseous structures are without acute abnormality. Soft tissues are grossly normal. IMPRESSION: Streaky airspace opacities in the lower lobes may represent atelectasis or scarring. Electronically Signed   By: Ted Mcalpine M.D.   On: 06/25/2020 15:12    Pending Labs Unresulted Labs (From admission, onward)    Start     Ordered   06/26/20 0500  CBC  Tomorrow morning,   R        06/25/20 2159   06/26/20 0500  Basic metabolic panel  Tomorrow morning,   R        06/25/20 2159   06/25/20 2159  HIV Antibody (routine testing w rflx)  (HIV Antibody (Routine testing w reflex) panel)  Once,   STAT        06/25/20 2159   06/25/20 2159  Hemoglobin A1c  Once,   STAT       Comments: To assess prior glycemic control    06/25/20 2159   06/25/20 1615  Blood culture (routine x 2)  BLOOD CULTURE X 2,   STAT      06/25/20 1614   06/25/20 1439  Urinalysis, Routine w reflex microscopic  ONCE - STAT,   STAT        06/25/20 1439          Vitals/Pain Today's Vitals   06/25/20 1900 06/25/20 2000 06/25/20 2030 06/25/20 2100  BP: (!) 168/68 (!) 169/71 (!) 152/80 (!) 181/67  Pulse: (!) 56 (!) 57 61 61  Resp: 15 13 18 16   Temp:      TempSrc:      SpO2: 98% 92% 97% 96%    Isolation Precautions No active isolations  Medications Medications  enoxaparin (LOVENOX) injection 30 mg (has no administration in time range)  cefTRIAXone (ROCEPHIN) 2 g in sodium chloride 0.9 % 100 mL IVPB (has no administration in time range)  azithromycin (ZITHROMAX) 500 mg in sodium chloride 0.9 % 250 mL IVPB (has no administration in time range)  insulin aspart (novoLOG) injection 0-15 Units (has no administration in time range)  sodium chloride 0.9 % bolus 1,000 mL (1,000 mLs Intravenous New Bag/Given 06/25/20 2054)  sodium chloride 0.9 % bolus 1,000 mL (0 mLs Intravenous Stopped 06/25/20 2054)  cefTRIAXone (ROCEPHIN) 1 g in sodium chloride 0.9 % 100 mL IVPB (0 g Intravenous Stopped 06/25/20 1928)  azithromycin (ZITHROMAX) 500 mg in sodium chloride 0.9 % 250 mL IVPB (500 mg Intravenous New Bag/Given 06/25/20 2053)    Mobility walks with device Low fall risk   Focused Assessments    R Recommendations: See Admitting Provider Note  Report given to:   Additional Notes:

## 2020-06-25 NOTE — ED Triage Notes (Signed)
Pt arrived via walk in, c/o ongoing UTI, per daughter, pt has been more confused, hypoxic at home to 82%, worsening weakness and fever (up to 101) and chills. Has had recent abx, finished with little improvement.

## 2020-06-25 NOTE — ED Provider Notes (Signed)
[facility]   CSN: [id number] Arrival date & time: 06/25/20  1406     History Chief Complaint  Patient presents with   Fever    Chelsea [name] is a 75 y.o. female.  Patient c/o fever on and off in past 1-2 weeks, including this AM. Symptoms acute onset, moderate, persistent, waxing and waning. Was dx w utis, and initially on cipro and currently macrobid. Denies dysuria, urgency or specific gu symptoms. Denies headache. No neck pain/stiffness. No chest pain. ?low pulse ox at home. +non productive cough, recently increased.  No abd pain or nvd. No extremity pain, injury, swelling or redness. No rash. Denies specific known ill contacts or known covid or flu exposure.  Report of low pulse ox earlier, ?82% at home.  The history is provided by the patient and a relative.  Fever Associated symptoms: cough   Associated symptoms: no chest pain, no confusion, no diarrhea, no dysuria, no headaches, no rash, no sore throat and no vomiting       Past Medical History:  Diagnosis Date   Blind left eye    Cardiac arrest (HCC)    Carotid artery occlusion    Diabetes mellitus without complication (HCC)    Hypertension    Stroke [facility](HCC)     Patient Active Problem List   Diagnosis Date Noted   Left carotid artery stenosis 12/03/2019   Stroke [facility](HCC)    Pain    Acute ischemic right posterior cerebral artery (PCA) stroke (HCC) 11/09/2019   Primary hypertension    Controlled type 2 diabetes mellitus with hyperglycemia (HCC)    Postherpetic neuralgia    Uncontrolled type 2 diabetes mellitus with hyperglycemia, without long-term current use of insulin (HCC) 11/07/2019   Hypertensive urgency 11/07/2019   Stenosis of left internal carotid artery 11/07/2019   Intractable nausea and vomiting 11/07/2019   Nicotine dependence, cigarettes, uncomplicated 11/07/2019   HZV (herpes zoster virus) post herpetic neuralgia 11/07/2019   Weakness of right lower  extremity 11/07/2019   CVA (cerebral vascular accident) (HCC) 11/07/2019    Past Surgical History:  Procedure Laterality Date   ENDARTERECTOMY Left 12/03/2019   Procedure: LEFT CAROTID ENDARTERECTOMY;  Surgeon: Chuck Hintickson, Christopher S, MD;  [facility];  Service: Vascular;  Laterality: Left;   PATCH ANGIOPLASTY Left 12/03/2019   Procedure: PATCH ANGIOPLASTY Left Carotid Artery;  Surgeon: Chuck Hintickson, Christopher S, MD;  [facility];  Service: Vascular;  Laterality: Left;     OB History   No obstetric history on file.     Family History  Problem Relation Age of Onset   Heart attack Mother    Stroke Father     Social History   Tobacco Use   Smoking status: Former    Pack years: 0.00   Smokeless tobacco: Never  Vaping Use   Vaping Use: Never used  Substance Use Topics   Alcohol use: Not Currently   Drug use: Never    Home Medications Prior to Admission medications   Medication Sig Start Date End Date Taking? Authorizing Provider  acetaminophen (TYLENOL) 325 MG tablet Take 2 tablets (650 mg total) by mouth every 6 (six) hours as needed for mild pain (or Fever >/= 101). 12/03/19   Angiulli, Mcarthur Rossettianiel J, PA-C  amLODipine (NORVASC) 10 MG tablet Take 1 tablet (10 mg total) by mouth daily. 12/05/19   Love, Evlyn KannerPamela S, PA-C  artificial tears (LACRILUBE) OINT ophthalmic ointment Place into the left eye at bedtime as needed  for dry eyes. 12/03/19   Angiulli, Mcarthur Rossetti, PA-C  Calcium-Magnesium-Zinc (CAL-MAG-ZINC PO) Take 1 tablet by mouth daily.    [provider]  carvedilol (COREG) 3.125 MG tablet Take 1 tablet (3.125 mg total) by mouth 2 (two) times daily with a meal. 06/18/20   Jodelle Red, MD  clopidogrel (PLAVIX) 75 MG tablet Take 1 tablet (75 mg total) by mouth daily. 12/05/19   Love, Evlyn Kanner, PA-C  furosemide (LASIX) 40 MG tablet Take 1 tablet (40 mg total) by mouth as needed (swelling). 06/18/20 09/16/20  Jodelle Red, MD  hydrALAZINE (APRESOLINE) 25 MG  tablet Take 25 mg by mouth 2 (two) times daily as needed.    [provider]  lisinopril (ZESTRIL) 10 MG tablet Take 10 mg by mouth daily.    [provider]  meclizine (ANTIVERT) 25 MG tablet Take 25 mg by mouth 3 (three) times daily as needed for dizziness.    [provider]  metFORMIN (GLUCOPHAGE) 500 MG tablet Take 1 tablet (500 mg total) by mouth 2 (two) times daily with a meal. 12/05/19   Love, Evlyn Kanner, PA-C  ondansetron (ZOFRAN-ODT) 4 MG disintegrating tablet Take 4 mg by mouth every 8 (eight) hours as needed for nausea/vomiting. 12/10/19   [provider]  polyethylene glycol (MIRALAX / GLYCOLAX) 17 g packet Take 17 g by mouth daily. 12/05/19   Love, Evlyn Kanner, PA-C  pregabalin (LYRICA) 50 MG capsule Take 1 capsule (50 mg total) by mouth 3 (three) times daily. 02/21/20   Micki Riley, MD  rosuvastatin (CRESTOR) 5 MG tablet Take 5 mg every other day for 2 weeks, then increase to 5 mg daily. 06/18/20   Jodelle Red, MD  scopolamine (TRANSDERM-SCOP) 1 MG/3DAYS Place 1 patch (1.5 mg total) onto the skin every 3 (three) days. Starting Friday morning 12/05/19   Love, Evlyn Kanner, PA-C    Allergies    Codeine, Penicillins, Sulfa antibiotics, Benzodiazepines, Lidocaine, Gabapentin, Lipitor [atorvastatin], and Lyrica [pregabalin]  Review of Systems   Review of Systems  Constitutional:  Positive for fever.  HENT:  Negative for sinus pain and sore throat.   Eyes:  Negative for pain, redness and visual disturbance.  Respiratory:  Positive for cough. Negative for shortness of breath.   Cardiovascular:  Negative for chest pain.  Gastrointestinal:  Negative for abdominal pain, diarrhea and vomiting.  Genitourinary:  Negative for dysuria and flank pain.  Musculoskeletal:  Negative for back pain, neck pain and neck stiffness.  Skin:  Negative for rash.  Neurological:  Negative for headaches.  Hematological:  Does not bruise/bleed easily.   Psychiatric/Behavioral:  Negative for confusion.    Physical Exam Updated Vital Signs BP 138/62 (BP Location: Left Arm)   Pulse 68   Temp 98.8 F (37.1 C) (Oral)   Resp 18   SpO2 98%   Physical Exam Vitals and nursing note reviewed.  Constitutional:      Appearance: Normal appearance. She is well-developed.  HENT:     Head: Atraumatic.     Nose: Nose normal.     Mouth/Throat:     Mouth: Mucous membranes are moist.  Eyes:     General: No scleral icterus.    Conjunctiva/sclera: Conjunctivae normal.     Pupils: Pupils are equal, round, and reactive to light.  Neck:     Trachea: No tracheal deviation.     Comments: No stiffness or rigidity. Trachea midline. Thyroid not grossly enlarged or tender.  Cardiovascular:     Rate  and Rhythm: Normal rate and regular rhythm.     Pulses: Normal pulses.     Heart sounds: Normal heart sounds. No murmur heard.   No friction rub. No gallop.  Pulmonary:     Effort: Pulmonary effort is normal. No respiratory distress.     Breath sounds: Rhonchi present.  Abdominal:     General: Bowel sounds are normal. There is no distension.     Palpations: Abdomen is soft.     Tenderness: There is no abdominal tenderness. There is no guarding.  Genitourinary:    Comments: No cva tenderness.  Musculoskeletal:        General: No swelling or tenderness.     Cervical back: Normal range of motion and neck supple. No rigidity. No muscular tenderness.  Skin:    General: Skin is warm and dry.     Findings: No rash.  Neurological:     Mental Status: She is alert.     Comments: Alert, speech normal. Motor/sens grossly intact bil.   Psychiatric:        Mood and Affect: Mood normal.    ED Results / Procedures / Treatments   Labs (all labs ordered are listed, but only abnormal results are displayed) Results for orders placed or performed during the hospital encounter of 06/25/20  Resp Panel by RT-PCR (Flu A&B, Covid) Nasopharyngeal Swab   Specimen:  Nasopharyngeal Swab; Nasopharyngeal(NP) swabs in vial transport medium  Result Value Ref Range   SARS Coronavirus 2 by RT PCR NEGATIVE NEGATIVE   Influenza A by PCR NEGATIVE NEGATIVE   Influenza B by PCR NEGATIVE NEGATIVE  CBC  Result Value Ref Range   WBC 22.1 (H) 4.0 - 10.5 K/uL   RBC 3.63 (L) 3.87 - 5.11 MIL/uL   Hemoglobin 11.4 (L) 12.0 - 15.0 g/dL   HCT 37.6 (L) 28.3 - 15.1 %   MCV 95.3 80.0 - 100.0 fL   MCH 31.4 26.0 - 34.0 pg   MCHC 32.9 30.0 - 36.0 g/dL   RDW 76.1 60.7 - 37.1 %   Platelets 371 150 - 400 K/uL   nRBC 0.0 0.0 - 0.2 %  Comprehensive metabolic panel  Result Value Ref Range   Sodium 135 135 - 145 mmol/L   Potassium 3.9 3.5 - 5.1 mmol/L   Chloride 100 98 - 111 mmol/L   CO2 26 22 - 32 mmol/L   Glucose, Bld 179 (H) 70 - 99 mg/dL   BUN 27 (H) 8 - 23 mg/dL   Creatinine, Ser 0.62 (H) 0.44 - 1.00 mg/dL   Calcium 9.4 8.9 - 69.4 mg/dL   Total Protein 7.1 6.5 - 8.1 g/dL   Albumin 4.2 3.5 - 5.0 g/dL   AST 15 15 - 41 U/L   ALT 11 0 - 44 U/L   Alkaline Phosphatase 92 38 - 126 U/L   Total Bilirubin 0.7 0.3 - 1.2 mg/dL   GFR, Estimated 32 (L) >60 mL/min   Anion gap 9 5 - 15  Lactic acid, plasma  Result Value Ref Range   Lactic Acid, Venous 1.4 0.5 - 1.9 mmol/L      EKG None  Radiology DG Chest Port 1 View  Result Date: 06/25/2020 CLINICAL DATA:  Fever. EXAM: PORTABLE CHEST 1 VIEW COMPARISON:  December 12, 2019 FINDINGS: Cardiomediastinal silhouette is normal. Mediastinal contours appear intact. Calcific atherosclerotic disease of the aorta. There is no evidence of focal airspace consolidation, pleural effusion or pneumothorax. Streaky airspace opacities in the lower lobes may represent  atelectasis or scarring. Osseous structures are without acute abnormality. Soft tissues are grossly normal. IMPRESSION: Streaky airspace opacities in the lower lobes may represent atelectasis or scarring. Electronically Signed   By: Ted Mcalpine M.D.   On: 06/25/2020 15:12     Procedures Procedures   Medications Ordered in ED Medications  sodium chloride 0.9 % bolus 1,000 mL (has no administration in time range)    ED Course  I have reviewed the triage vital signs and the nursing notes.  Pertinent labs & imaging results that were available during my care of the patient were reviewed by me and considered in my medical decision making (see chart for details).    MDM Rules/Calculators/A&P                         Iv ns. Stat labs. Pcxr.   Reviewed nursing notes and prior charts for additional history.   Labs reviewed/interpreted by me - wbc elevated. Lactate is normal.   Cr is mildly elevated. Iv ns bolus.   Cultures sent.   CXR reviewed/interpreted by me - possible pna (vs atelectasis). Iv abx given.   Patient indicates feels too weak to go home, no appetite - pts wbc is quite high, she has been on outpatient abx, cr mildly high - will consult hospitalist for admission.         Final Clinical Impression(s) / ED Diagnoses Final diagnoses:  None    Rx / DC Orders ED Discharge Orders     None        Cathren Laine, MD 06/25/20 2123

## 2020-06-26 ENCOUNTER — Observation Stay (HOSPITAL_COMMUNITY): Payer: PPO

## 2020-06-26 DIAGNOSIS — Z7902 Long term (current) use of antithrombotics/antiplatelets: Secondary | ICD-10-CM | POA: Diagnosis not present

## 2020-06-26 DIAGNOSIS — J9601 Acute respiratory failure with hypoxia: Secondary | ICD-10-CM | POA: Diagnosis present

## 2020-06-26 DIAGNOSIS — D631 Anemia in chronic kidney disease: Secondary | ICD-10-CM | POA: Diagnosis present

## 2020-06-26 DIAGNOSIS — N390 Urinary tract infection, site not specified: Secondary | ICD-10-CM | POA: Diagnosis present

## 2020-06-26 DIAGNOSIS — R0602 Shortness of breath: Secondary | ICD-10-CM | POA: Diagnosis not present

## 2020-06-26 DIAGNOSIS — I129 Hypertensive chronic kidney disease with stage 1 through stage 4 chronic kidney disease, or unspecified chronic kidney disease: Secondary | ICD-10-CM | POA: Diagnosis present

## 2020-06-26 DIAGNOSIS — Z7984 Long term (current) use of oral hypoglycemic drugs: Secondary | ICD-10-CM | POA: Diagnosis not present

## 2020-06-26 DIAGNOSIS — J440 Chronic obstructive pulmonary disease with acute lower respiratory infection: Secondary | ICD-10-CM | POA: Diagnosis present

## 2020-06-26 DIAGNOSIS — R410 Disorientation, unspecified: Secondary | ICD-10-CM | POA: Diagnosis not present

## 2020-06-26 DIAGNOSIS — Z8249 Family history of ischemic heart disease and other diseases of the circulatory system: Secondary | ICD-10-CM | POA: Diagnosis not present

## 2020-06-26 DIAGNOSIS — E1122 Type 2 diabetes mellitus with diabetic chronic kidney disease: Secondary | ICD-10-CM | POA: Diagnosis present

## 2020-06-26 DIAGNOSIS — E440 Moderate protein-calorie malnutrition: Secondary | ICD-10-CM | POA: Diagnosis present

## 2020-06-26 DIAGNOSIS — J189 Pneumonia, unspecified organism: Secondary | ICD-10-CM | POA: Diagnosis present

## 2020-06-26 DIAGNOSIS — I10 Essential (primary) hypertension: Secondary | ICD-10-CM

## 2020-06-26 DIAGNOSIS — Z87891 Personal history of nicotine dependence: Secondary | ICD-10-CM | POA: Diagnosis not present

## 2020-06-26 DIAGNOSIS — D638 Anemia in other chronic diseases classified elsewhere: Secondary | ICD-10-CM

## 2020-06-26 DIAGNOSIS — E1165 Type 2 diabetes mellitus with hyperglycemia: Secondary | ICD-10-CM | POA: Diagnosis present

## 2020-06-26 DIAGNOSIS — Z8673 Personal history of transient ischemic attack (TIA), and cerebral infarction without residual deficits: Secondary | ICD-10-CM | POA: Diagnosis not present

## 2020-06-26 DIAGNOSIS — Z79899 Other long term (current) drug therapy: Secondary | ICD-10-CM | POA: Diagnosis not present

## 2020-06-26 DIAGNOSIS — N1831 Chronic kidney disease, stage 3a: Secondary | ICD-10-CM | POA: Diagnosis present

## 2020-06-26 DIAGNOSIS — Z20822 Contact with and (suspected) exposure to covid-19: Secondary | ICD-10-CM | POA: Diagnosis present

## 2020-06-26 DIAGNOSIS — N179 Acute kidney failure, unspecified: Secondary | ICD-10-CM | POA: Diagnosis present

## 2020-06-26 DIAGNOSIS — R3989 Other symptoms and signs involving the genitourinary system: Secondary | ICD-10-CM

## 2020-06-26 DIAGNOSIS — Z66 Do not resuscitate: Secondary | ICD-10-CM | POA: Diagnosis present

## 2020-06-26 LAB — CBC
HCT: 28.3 % — ABNORMAL LOW (ref 36.0–46.0)
Hemoglobin: 9.4 g/dL — ABNORMAL LOW (ref 12.0–15.0)
MCH: 31.5 pg (ref 26.0–34.0)
MCHC: 33.2 g/dL (ref 30.0–36.0)
MCV: 95 fL (ref 80.0–100.0)
Platelets: 308 10*3/uL (ref 150–400)
RBC: 2.98 MIL/uL — ABNORMAL LOW (ref 3.87–5.11)
RDW: 13.2 % (ref 11.5–15.5)
WBC: 10.2 10*3/uL (ref 4.0–10.5)
nRBC: 0 % (ref 0.0–0.2)

## 2020-06-26 LAB — BASIC METABOLIC PANEL
Anion gap: 4 — ABNORMAL LOW (ref 5–15)
BUN: 29 mg/dL — ABNORMAL HIGH (ref 8–23)
CO2: 27 mmol/L (ref 22–32)
Calcium: 8.7 mg/dL — ABNORMAL LOW (ref 8.9–10.3)
Chloride: 109 mmol/L (ref 98–111)
Creatinine, Ser: 1.47 mg/dL — ABNORMAL HIGH (ref 0.44–1.00)
GFR, Estimated: 37 mL/min — ABNORMAL LOW (ref >60)
Glucose, Bld: 149 mg/dL — ABNORMAL HIGH (ref 70–99)
Potassium: 4 mmol/L (ref 3.5–5.1)
Sodium: 140 mmol/L (ref 135–145)

## 2020-06-26 LAB — GLUCOSE, CAPILLARY
Glucose-Capillary: 102 mg/dL — ABNORMAL HIGH (ref 70–99)
Glucose-Capillary: 102 mg/dL — ABNORMAL HIGH (ref 70–99)
Glucose-Capillary: 106 mg/dL — ABNORMAL HIGH (ref 70–99)
Glucose-Capillary: 118 mg/dL — ABNORMAL HIGH (ref 70–99)
Glucose-Capillary: 170 mg/dL — ABNORMAL HIGH (ref 70–99)

## 2020-06-26 LAB — HIV ANTIBODY (ROUTINE TESTING W REFLEX): HIV Screen 4th Generation wRfx: NONREACTIVE

## 2020-06-26 MED ORDER — SENNOSIDES-DOCUSATE SODIUM 8.6-50 MG PO TABS
1.0000 | ORAL_TABLET | Freq: Two times a day (BID) | ORAL | Status: DC
  Administered 2020-06-26 – 2020-06-27 (×3): 1 via ORAL
  Filled 2020-06-26 (×3): qty 1

## 2020-06-26 MED ORDER — SODIUM CHLORIDE 0.9 % IV SOLN: INTRAVENOUS | Status: DC

## 2020-06-26 MED ORDER — GUAIFENESIN ER 600 MG PO TB12
1200.0000 mg | ORAL_TABLET | Freq: Two times a day (BID) | ORAL | Status: DC
  Administered 2020-06-26 – 2020-06-27 (×3): 1200 mg via ORAL
  Filled 2020-06-26 (×3): qty 2

## 2020-06-26 MED ORDER — POLYETHYLENE GLYCOL 3350 17 G PO PACK
17.0000 g | PACK | Freq: Two times a day (BID) | ORAL | Status: DC
  Administered 2020-06-26 – 2020-06-27 (×3): 17 g via ORAL
  Filled 2020-06-26 (×3): qty 1

## 2020-06-26 NOTE — Progress Notes (Signed)
Patient's blood glucose is 170, no bedtime coverage. Provider on call notified as per orders.

## 2020-06-26 NOTE — Evaluation (Signed)
Physical Therapy Evaluation Patient Details Name: Chelsea [name] MRN: [id number] DOB: 10/10/1945 Today's Date: 06/26/2020   History of Present Illness  75 y.o. female admitted with fever, recent UTI dx, new BLL PNA dx. Pt with medical history significant of Stroke, DM2, HTN, CKD 3, recurrent UTIs, former smoker.  Clinical Impression  Pt admitted with above diagnosis. Mod assist for sit to stand x 4 trials with RW, SaO2 99% on room air. Pt took several pivotal steps from bed to recliner, to bedside commode, then back to recliner. Ambulation deferred 2* fatigue/unsteadiness with stand pivot transfers.  Pt currently with functional limitations due to the deficits listed below (see PT Problem List). Pt will benefit from skilled PT to increase their independence and safety with mobility to allow discharge to the venue listed below.       Follow Up Recommendations Home health PT; assistance for mobiltiy    Equipment Recommendations  None recommended by PT    Recommendations for Other Services       Precautions / Restrictions Precautions Precautions: Fall Precaution Comments: pt vague when asked about fall hx, sounds as if there have been a few falls in the past year Restrictions Weight Bearing Restrictions: No      Mobility  Bed Mobility Overal bed mobility: Needs Assistance Bed Mobility: Supine to Sit     Supine to sit: Min assist     General bed mobility comments: min A to pivot hips to edge of bed and to raise trunk    Transfers Overall transfer level: Needs assistance Equipment used: Rolling walker (2 wheeled) Transfers: Sit to/from [employer] Sit to Stand: Mod assist Stand pivot transfers: Min assist       General transfer comment: sit to stand x 4 trials with RW, mod A to power up; min A to pivot to recliner then to 3 in 1 then to recliner; SaO2 99% on room air with activity  Ambulation/Gait             General Gait Details: deferred 2*  fatigue with stand pivot transfers  Stairs            Wheelchair Mobility    Modified Rankin (Stroke Patients Only)       Balance Overall balance assessment: Needs assistance   Sitting balance-Leahy Scale: Fair     Standing balance support: Bilateral upper extremity supported Standing balance-Leahy Scale: Poor                               Pertinent Vitals/Pain Pain Assessment: No/denies pain    Home Living Family/patient expects to be discharged to:: (P) Private residence Living Arrangements: (P) Children Available Help at Discharge: (P) Family;Available 24 hours/day Type of Home: (P) House Home Access: (P) Ramped entrance;Stairs to enter Entrance Stairs-Rails: (P) Can reach both Entrance Stairs-Number of Steps: (P) 4 Home Layout: (P) One level Home Equipment: (P) Shower seat;Walker - 2 wheels;Bedside commode;Grab bars - tub/shower;Grab bars - toilet;Other (comment)      Prior Function Level of Independence: (P) Needs assistance   Gait / Transfers Assistance Needed: (P) walks with RW  ADL's / Homemaking Assistance Needed: (P) daughter assists with sponge baths  Comments: (P) daughter helps with sponge baths     Hand Dominance        Extremity/Trunk Assessment   Upper Extremity Assessment Upper Extremity Assessment: Generalized weakness    Lower Extremity Assessment Lower Extremity Assessment: Generalized weakness  Cervical / Trunk Assessment Cervical / Trunk Assessment: Kyphotic  Communication   Communication: (P) No difficulties  Cognition Arousal/Alertness: Awake/alert Behavior During Therapy: WFL for tasks assessed/performed Overall Cognitive Status: No family/caregiver present to determine baseline cognitive functioning                                 General Comments: able to follow commands, some vague responses to questions      General Comments      Exercises     Assessment/Plan    PT Assessment  Patient needs continued PT services  PT Problem List Decreased activity tolerance;Decreased strength;Decreased balance       PT Treatment Interventions Gait training;Therapeutic activities;Therapeutic exercise;Functional mobility training;Patient/family education;Balance training    PT Goals (Current goals can be found in the Care Plan section)  Acute Rehab PT Goals Patient Stated Goal: to get stronger PT Goal Formulation: With patient Time For Goal Achievement: 07/10/20 Potential to Achieve Goals: Fair    Frequency Min 3X/week   Barriers to discharge        Co-evaluation               AM-PAC PT "6 Clicks" Mobility  Outcome Measure Help needed turning from your back to your side while in a flat bed without using bedrails?: A Little Help needed moving from lying on your back to sitting on the side of a flat bed without using bedrails?: A Little Help needed moving to and from a bed to a chair (including a wheelchair)?: A Little Help needed standing up from a chair using your arms (e.g., wheelchair or bedside chair)?: A Lot Help needed to walk in hospital room?: A Lot Help needed climbing 3-5 steps with a railing? : Total 6 Click Score: 14    End of Session Equipment Utilized During Treatment: Gait belt Activity Tolerance: Patient limited by fatigue Patient left: in chair;with call bell/phone within reach;with chair alarm set (chair alarm pad under pt, chair alarm box did not activate when turned on, NT notified) Nurse Communication: Mobility status PT Visit Diagnosis: Difficulty in walking, not elsewhere classified (R26.2);Muscle weakness (generalized) (M62.81)    Time: 0630-1601 PT Time Calculation (min) (ACUTE ONLY): 26 min   Charges:   PT Evaluation $PT Eval Moderate Complexity: 1 Mod PT Treatments $Therapeutic Activity: 8-22 mins       Ralene Bathe Kistler PT 06/26/2020  Acute Rehabilitation Services Pager [phone] Office [phone]

## 2020-06-26 NOTE — Plan of Care (Signed)
  Problem: Clinical Measurements: Goal: Respiratory complications will improve Outcome: Progressing   Problem: Activity: Goal: Risk for activity intolerance will decrease Outcome: Progressing   

## 2020-06-26 NOTE — Progress Notes (Signed)
    BRIEF OVERNIGHT PROGRESS REPORT  Called by RN to address patient wish to be DNR. Patient is conversational and able to discuss her wishes regarding this status. Her family member (daughter: Smith Robert) is also available at bedside and they have spoken and agree. They are aware of what is involved with this status. The Patient and daughter have a DNR form at home and will provide a copy of this later today.    Chinita Greenland MSNA ACNPC-AG Acute Care Nurse Practitioner [facility] Pager [phone] [place]

## 2020-06-26 NOTE — Progress Notes (Signed)
PROGRESS NOTE    Chelsea [name]  INO:[id number] DOB: [birth year] DOA: 06/25/2020 PCP: Roe Rutherford, NP   Brief Narrative:  The patient is a 75 year old overweight Caucasian female with a past medical history significant for but not limited to history of CVA, diabetes mellitus type 2, hypertension, CKD stage IIIa, recurrent UTIs and prior tobacco abuse who presented with intermittent fevers for last 1 to 2 weeks.  Symptoms were persistent.  Initially she was diagnosed with a UTI and placed on ciprofloxacin and switched to Macrobid.  Symptoms persisted despite 2 rounds of antibiotics.  Her urine culture on 6 6 grew out E. coli that was sensitive to cephalosporins and resistant to Cipro and sensitive to nitrofurantoin.  Patient denies any dysuria or headache but does have a dry cough.  Initial evaluation showed that she had pulse oximetry of 82% on room air at home.  Because symptoms were intermittent and worsening and not improved with antibiotic courses she presented to the ED and was noted to have a WBC of 22,000.  She had bilateral lower lobe streaky opacities consistent with atelectasis versus pneumonia.  Her COVID and flu testing were negative.  She was given IV azithromycin and Rocephin in the ED with improvement.  Currently she is being treated for community-acquired pneumonia and suspected E. coli UTI.  Assessment & Plan:   Principal Problem:   CAP (community acquired pneumonia) Active Problems:   Uncontrolled type 2 diabetes mellitus with hyperglycemia, without long-term current use of insulin (HCC)   Primary hypertension   CKD (chronic kidney disease) stage 3, GFR 30-59 ml/min (HCC)  Acute respiratory failure with hypoxia Community-acquired pneumonia -Chest x-ray on admission showed "Streaky airspace opacities in the lower lobes may represent atelectasis or scarring."  Initially this was thought to be pneumonia in the source of her infection given that she is hypoxic on  admission -Repeat chest x-ray now showing "Cardiac shadow is stable. Aortic calcifications are noted. The lungs are well aerated bilaterally. Bibasilar scarring/atelectasis is seen stable from the prior exam. No sizable effusion  is seen. No focal confluent infiltrate is noted." -SpO2: 98 % -Started on antibiotics with IV ceftriaxone and azithromycin we will continue After she has been weaned off of supplemental oxygen For continuous pulse oximetry maintain O2 saturations greater than 90% -Continue supplemental oxygen via nasal cannula wean O2 as tolerated She will need an ambulatory home O2 screen prior to discharge as well as repeat chest x-ray -WBC has improved and went from 22.1 is now 10.2 -Blood cultures x2 pending; influenza AMB negative and SARS coronavirus 2 negative -We will add guaifenesin and breathing treatments as well as flutter valve and incentive spirometer  Suspected UTI, present on admission -Recently diagnosed with a E. coli UTI last week and was placed on Cipro but now she is on IV ceftriaxone as above -Urinalysis showed a hazy appearance with small hemoglobin, large leukocytes, negative nitrites, few bacteria, 6-10 RBCs per high-power field, greater than 50 WBCs with urine culture now pending as it was not obtained on admission -Continue with empiric treatment IV ceftriaxone as she has no dysuria currently  History of acute urinary tension -Check bladder scans every shift straight in and out cath  AKI on CKD stage IIIa -Patient's BUNs/creatinine went from 27/1.65 is now 29/1.47 -Continue to hold Lasix -Avoid nephrotoxic medications, contrast dyes, hypotension and renally adjust medications -Continue with IV fluid hydration with normal saline at 75 MLS per hour -Repeat CMP in a.m.  History of CVA -Continue  rosuvastatin 5 mg p.o. daily as well as clopidogrel 75 mg p.o. daily  Hypertension -Continue with lisinopril 10 mg p.o. daily, amlodipine 10 mg p.o. daily and  with hydralazine 25 mg p.o. twice daily as needed for systolic blood pressure greater than 180 or diastolic blood pressure greater than 100  Diabetes mellitus type 2 -Metformin -Started on a moderate NovoLog sign scale insulin AC -Continue monitor CBGs; CBGs ranging from 102-118  Normocytic anemia/anemia of chronic kidney disease -Patient's hemoglobin/hematocrit went from 11.4/34.6 and is now 9.4/28.3 -Check anemia panel in the a.m.  -continue to monitor for signs and symptoms of bleeding; no overt bleeding noted -Repeat CBC in a.m.   DVT prophylaxis: Enoxaparin 30 mg subcu every 24 Code Status: DO NOT RESUSCITATE  Family Communication: Discussed with Daughter at bedside Disposition Plan: Anticipating discharging home in next 24 to 48 hours  Status is: Observation  The patient will require care spanning > 2 midnights and should be moved to inpatient because: Unsafe d/c plan, IV treatments appropriate due to intensity of illness or inability to take PO, and Inpatient level of care appropriate due to severity of illness  Dispo: The patient is from: Home              Anticipated d/c is to: Home              Patient currently is not medically stable to d/c.   Difficult to place patient No  Consultants:  None  Procedures: None  Antimicrobials:  Anti-infectives (From admission, onward)    Start     Dose/Rate Route Frequency Ordered Stop   06/26/20 2000  azithromycin (ZITHROMAX) 500 mg in sodium chloride 0.9 % 250 mL IVPB        500 mg 250 mL/hr over 60 Minutes Intravenous Every 24 hours 06/25/20 2159 07/01/20 1959   06/26/20 1600  cefTRIAXone (ROCEPHIN) 2 g in sodium chloride 0.9 % 100 mL IVPB        2 g 200 mL/hr over 30 Minutes Intravenous Every 24 hours 06/25/20 2159 07/01/20 1559   06/25/20 1630  cefTRIAXone (ROCEPHIN) 1 g in sodium chloride 0.9 % 100 mL IVPB        1 g 200 mL/hr over 30 Minutes Intravenous  Once 06/25/20 1616 06/25/20 1928   06/25/20 1630  azithromycin  (ZITHROMAX) 500 mg in sodium chloride 0.9 % 250 mL IVPB        500 mg 250 mL/hr over 60 Minutes Intravenous  Once 06/25/20 1616 06/25/20 2153        Subjective: Seen and examined at bedside and she was doing better today.  She denied any nausea or vomiting lightheadedness or dizziness.  Did have some lower abdominal discomfort on palpation.  Does have a dry cough and difficult to expectorate her sputum.  No nausea or vomiting.  No other concerns or complaints at this time.  Objective: Vitals:   06/25/20 2340 06/26/20 0026 06/26/20 0028 06/26/20 0415  BP: (!) 159/74   (!) 151/65  Pulse: 65   62  Resp: 18   17  Temp: 98.4 F (36.9 C)   98.3 F (36.8 C)  TempSrc: Oral   Oral  SpO2: 99%   98%  Weight:  61.8 kg    Height:   5' (1.524 m)     Intake/Output Summary (Last 24 hours) at 06/26/2020 0748 Last data filed at 06/26/2020 0618 Gross per 24 hour  Intake 2200 ml  Output 280 ml  Net 1920 ml  Filed Weights   06/26/20 0026  Weight: 61.8 kg   Examination: Physical Exam:  Constitutional: WN/WD elderly overweight Caucasian female currently in no acute distress appears calm and comfortable Eyes: Lids and conjunctivae normal, sclerae anicteric  ENMT: External Ears, Nose appear normal. Grossly normal hearing.  Neck: Appears normal, supple, no cervical masses, normal ROM, no appreciable thyromegaly; no JVD Respiratory: Diminished to auscultation bilaterally at the bases with coarse breath sounds, no wheezing, rales, rhonchi or crackles. Normal respiratory effort and patient is not tachypenic. No accessory muscle use.  Unlabored breathing Cardiovascular: RRR, no murmurs / rubs / gallops. S1 and S2 auscultated.  Has mild lower extremity edema slightly worse on the right compared to left. Abdomen: Soft, tender to palpate in the lower abdomen, distended secondary to body habitus.  Bowel sounds positive.  GU: Deferred. Musculoskeletal: No clubbing / cyanosis of digits/nails. No joint  deformity upper and lower extremities.  Skin: No rashes, lesions, ulcers on limited skin evaluation. No induration; Warm and dry.  Neurologic: CN 2-12 grossly intact with no focal deficits. Romberg sign and cerebellar reflexes not assessed.  Psychiatric: Normal judgment and insight. Alert and oriented x 3. Normal mood and appropriate affect.   Data Reviewed: I have personally reviewed following labs and imaging studies  CBC: Recent Labs  Lab 06/25/20 1514 06/26/20 0439  WBC 22.1* 10.2  HGB 11.4* 9.4*  HCT 34.6* 28.3*  MCV 95.3 95.0  PLT 371 308   Basic Metabolic Panel: Recent Labs  Lab 06/25/20 1514 06/26/20 0439  NA 135 140  K 3.9 4.0  CL 100 109  CO2 26 27  GLUCOSE 179* 149*  BUN 27* 29*  CREATININE 1.65* 1.47*  CALCIUM 9.4 8.7*   GFR: Estimated Creatinine Clearance: 26.7 mL/min (A) (by C-G formula based on SCr of 1.47 mg/dL (H)). Liver Function Tests: Recent Labs  Lab 06/25/20 1514  AST 15  ALT 11  ALKPHOS 92  BILITOT 0.7  PROT 7.1  ALBUMIN 4.2   No results for input(s): LIPASE, AMYLASE in the last 168 hours. No results for input(s): AMMONIA in the last 168 hours. Coagulation Profile: No results for input(s): INR, PROTIME in the last 168 hours. Cardiac Enzymes: No results for input(s): CKTOTAL, CKMB, CKMBINDEX, TROPONINI in the last 168 hours. BNP (last 3 results) No results for input(s): PROBNP in the last 8760 hours. HbA1C: No results for input(s): HGBA1C in the last 72 hours. CBG: Recent Labs  Lab 06/26/20 0032  GLUCAP 106*   Lipid Profile: No results for input(s): CHOL, HDL, LDLCALC, TRIG, CHOLHDL, LDLDIRECT in the last 72 hours. Thyroid Function Tests: No results for input(s): TSH, T4TOTAL, FREET4, T3FREE, THYROIDAB in the last 72 hours. Anemia Panel: No results for input(s): VITAMINB12, FOLATE, FERRITIN, TIBC, IRON, RETICCTPCT in the last 72 hours. Sepsis Labs: Recent Labs  Lab 06/25/20 1514  LATICACIDVEN 1.4    Recent Results (from  the past 240 hour(s))  Resp Panel by RT-PCR (Flu A&B, Covid) Nasopharyngeal Swab     Status: None   Collection Time: 06/25/20  2:39 PM   Specimen: Nasopharyngeal Swab; Nasopharyngeal(NP) swabs in vial transport medium  Result Value Ref Range Status   SARS Coronavirus 2 by RT PCR NEGATIVE NEGATIVE Final    Comment: (NOTE) SARS-CoV-2 target nucleic acids are NOT DETECTED.  The SARS-CoV-2 RNA is generally detectable in upper respiratory specimens during the acute phase of infection. The lowest concentration of SARS-CoV-2 viral copies this assay can detect is 138 copies/mL. A negative result does  not preclude SARS-Cov-2 infection and should not be used as the sole basis for treatment or other patient management decisions. A negative result may occur with  improper specimen collection/handling, submission of specimen other than nasopharyngeal swab, presence of viral mutation(s) within the areas targeted by this assay, and inadequate number of viral copies(<138 copies/mL). A negative result must be combined with clinical observations, patient history, and epidemiological information. The expected result is Negative.  Fact Sheet for Patients:  BloggerCourse.com  Fact Sheet for Healthcare Providers:  SeriousBroker.it  This test is no t yet approved or cleared by the [place] FDA and  has been authorized for detection and/or diagnosis of SARS-CoV-2 by FDA under an Emergency Use Authorization (EUA). This EUA will remain  in effect (meaning this test can be used) for the duration of the COVID-19 declaration under Section 564(b)(1) of the Act, 21 U.S.C.section 360bbb-3(b)(1), unless the authorization is terminated  or revoked sooner.       Influenza A by PCR NEGATIVE NEGATIVE Final   Influenza B by PCR NEGATIVE NEGATIVE Final    Comment: (NOTE) The Xpert Xpress SARS-CoV-2/FLU/RSV plus assay is intended as an aid in the diagnosis of  influenza from Nasopharyngeal swab specimens and should not be used as a sole basis for treatment. Nasal washings and aspirates are unacceptable for Xpert Xpress SARS-CoV-2/FLU/RSV testing.  Fact Sheet for Patients: BloggerCourse.com  Fact Sheet for Healthcare Providers: SeriousBroker.it  This test is not yet approved or cleared by the [place] FDA and has been authorized for detection and/or diagnosis of SARS-CoV-2 by FDA under an Emergency Use Authorization (EUA). This EUA will remain in effect (meaning this test can be used) for the duration of the COVID-19 declaration under Section 564(b)(1) of the Act, 21 U.S.C. section 360bbb-3(b)(1), unless the authorization is terminated or revoked.  Performed at [facility], [address] [place], [place] [address]   Blood culture (routine x 2)     Status: None (Preliminary result)   Collection Time: 06/25/20  4:20 PM   Specimen: BLOOD  Result Value Ref Range Status   Specimen Description   Final    BLOOD LEFT ANTECUBITAL Performed at [facility], [address] [place], [place] [address]    Special Requests   Final    BOTTLES DRAWN AEROBIC AND ANAEROBIC Blood Culture results may not be optimal due to an inadequate volume of blood received in culture bottles Performed at [facility], [address], [place], [place] [address]    Culture   Final    NO GROWTH < 12 HOURS Performed at [facility], [address], [place], [place] [address]    Report Status PENDING  Incomplete  Blood culture (routine x 2)     Status: None (Preliminary result)   Collection Time: 06/25/20  5:23 PM   Specimen: BLOOD  Result Value Ref Range Status   Specimen Description   Final    BLOOD LEFT ANTECUBITAL Performed at [facility], [address] [place], [place] [address]    Special Requests   Final    BOTTLES  DRAWN AEROBIC AND ANAEROBIC Blood Culture adequate volume Performed at [facility], [address], [place], [place] [address]    Culture   Final    NO GROWTH < 12 HOURS Performed at [facility], [address], [place], [place] [address]    Report Status PENDING  Incomplete    RN  Pressure Injury Documentation:     Estimated body mass index is 26.61 kg/m as calculated from the following:   Height as of this encounter: 5' (1.524 m).   Weight as of this encounter: 61.8 kg.  Malnutrition Type:   Malnutrition Characteristics:   Nutrition Interventions:     Radiology Studies: DG Chest Port 1 View  Result Date: 06/25/2020 CLINICAL DATA:  Fever. EXAM: PORTABLE CHEST 1 VIEW COMPARISON:  December 12, 2019 FINDINGS: Cardiomediastinal silhouette is normal. Mediastinal contours appear intact. Calcific atherosclerotic disease of the aorta. There is no evidence of focal airspace consolidation, pleural effusion or pneumothorax. Streaky airspace opacities in the lower lobes may represent atelectasis or scarring. Osseous structures are without acute abnormality. Soft tissues are grossly normal. IMPRESSION: Streaky airspace opacities in the lower lobes may represent atelectasis or scarring. Electronically Signed   By: Ted Mcalpineobrinka  Dimitrova M.D.   On: 06/25/2020 15:12    Scheduled Meds:  amLODipine  10 mg Oral Daily   carvedilol  3.125 mg Oral BID WC   clopidogrel  75 mg Oral Daily   enoxaparin (LOVENOX) injection  30 mg Subcutaneous Q24H   insulin aspart  0-15 Units Subcutaneous TID WC   lisinopril  10 mg Oral Daily   polyethylene glycol  17 g Oral Q3 days   rosuvastatin  5 mg Oral Daily   Continuous Infusions:  azithromycin     cefTRIAXone (ROCEPHIN)  IV      LOS: 0 days   Merlene Laughtermair Latif Yvette Roark, DO [facility] PAGER is on AMION  If 7PM-7AM, please contact night-coverage [email or web address]

## 2020-06-26 NOTE — Plan of Care (Signed)

## 2020-06-27 ENCOUNTER — Inpatient Hospital Stay (HOSPITAL_COMMUNITY): Payer: PPO

## 2020-06-27 DIAGNOSIS — E440 Moderate protein-calorie malnutrition: Secondary | ICD-10-CM

## 2020-06-27 DIAGNOSIS — J9601 Acute respiratory failure with hypoxia: Secondary | ICD-10-CM

## 2020-06-27 LAB — COMPREHENSIVE METABOLIC PANEL
ALT: 10 U/L (ref 0–44)
AST: 12 U/L — ABNORMAL LOW (ref 15–41)
Albumin: 3.3 g/dL — ABNORMAL LOW (ref 3.5–5.0)
Alkaline Phosphatase: 80 U/L (ref 38–126)
Anion gap: 5 (ref 5–15)
BUN: 33 mg/dL — ABNORMAL HIGH (ref 8–23)
CO2: 25 mmol/L (ref 22–32)
Calcium: 8.7 mg/dL — ABNORMAL LOW (ref 8.9–10.3)
Chloride: 109 mmol/L (ref 98–111)
Creatinine, Ser: 1.79 mg/dL — ABNORMAL HIGH (ref 0.44–1.00)
GFR, Estimated: 29 mL/min — ABNORMAL LOW (ref >60)
Glucose, Bld: 116 mg/dL — ABNORMAL HIGH (ref 70–99)
Potassium: 4.5 mmol/L (ref 3.5–5.1)
Sodium: 139 mmol/L (ref 135–145)
Total Bilirubin: 0.5 mg/dL (ref 0.3–1.2)
Total Protein: 5.8 g/dL — ABNORMAL LOW (ref 6.5–8.1)

## 2020-06-27 LAB — CBC WITH DIFFERENTIAL/PLATELET
Abs Immature Granulocytes: 0.02 10*3/uL (ref 0.00–0.07)
Basophils Absolute: 0.1 10*3/uL (ref 0.0–0.1)
Basophils Relative: 1 %
Eosinophils Absolute: 0.6 10*3/uL — ABNORMAL HIGH (ref 0.0–0.5)
Eosinophils Relative: 7 %
HCT: 29.2 % — ABNORMAL LOW (ref 36.0–46.0)
Hemoglobin: 9.5 g/dL — ABNORMAL LOW (ref 12.0–15.0)
Immature Granulocytes: 0 %
Lymphocytes Relative: 28 %
Lymphs Abs: 2.5 10*3/uL (ref 0.7–4.0)
MCH: 31.4 pg (ref 26.0–34.0)
MCHC: 32.5 g/dL (ref 30.0–36.0)
MCV: 96.4 fL (ref 80.0–100.0)
Monocytes Absolute: 0.6 10*3/uL (ref 0.1–1.0)
Monocytes Relative: 7 %
Neutro Abs: 5 10*3/uL (ref 1.7–7.7)
Neutrophils Relative %: 57 %
Platelets: 297 10*3/uL (ref 150–400)
RBC: 3.03 MIL/uL — ABNORMAL LOW (ref 3.87–5.11)
RDW: 13.2 % (ref 11.5–15.5)
WBC: 8.7 10*3/uL (ref 4.0–10.5)
nRBC: 0 % (ref 0.0–0.2)

## 2020-06-27 LAB — GLUCOSE, CAPILLARY
Glucose-Capillary: 103 mg/dL — ABNORMAL HIGH (ref 70–99)
Glucose-Capillary: 111 mg/dL — ABNORMAL HIGH (ref 70–99)

## 2020-06-27 LAB — HEMOGLOBIN A1C
Hgb A1c MFr Bld: 5.9 % — ABNORMAL HIGH (ref 4.8–5.6)
Mean Plasma Glucose: 123 mg/dL

## 2020-06-27 LAB — PHOSPHORUS: Phosphorus: 3.3 mg/dL (ref 2.5–4.6)

## 2020-06-27 LAB — MAGNESIUM: Magnesium: 1.9 mg/dL (ref 1.7–2.4)

## 2020-06-27 MED ORDER — AZITHROMYCIN 250 MG PO TABS: 500.0000 mg | ORAL_TABLET | ORAL | Status: DC

## 2020-06-27 MED ORDER — HYDRALAZINE HCL 25 MG PO TABS
25.0000 mg | ORAL_TABLET | Freq: Three times a day (TID) | ORAL | Status: DC
  Administered 2020-06-27: 25 mg via ORAL
  Filled 2020-06-27: qty 1

## 2020-06-27 MED ORDER — HYDRALAZINE HCL 25 MG PO TABS
25.0000 mg | ORAL_TABLET | Freq: Three times a day (TID) | ORAL | 0 refills | Status: DC
Start: 2020-06-27 — End: 2020-07-16

## 2020-06-27 MED ORDER — SENNOSIDES-DOCUSATE SODIUM 8.6-50 MG PO TABS: 1.0000 | ORAL_TABLET | Freq: Two times a day (BID) | ORAL | 0 refills | Status: AC

## 2020-06-27 MED ORDER — GUAIFENESIN ER 600 MG PO TB12: 600.0000 mg | ORAL_TABLET | Freq: Two times a day (BID) | ORAL | 0 refills | Status: AC

## 2020-06-27 MED ORDER — CEFDINIR 300 MG PO CAPS: 300.0000 mg | ORAL_CAPSULE | Freq: Every day | ORAL | 0 refills | Status: AC

## 2020-06-27 MED ORDER — ENSURE MAX PROTEIN PO LIQD
11.0000 [oz_av] | Freq: Once | ORAL | Status: AC
  Administered 2020-06-27: 11 [oz_av] via ORAL

## 2020-06-27 MED ORDER — SODIUM CHLORIDE 0.9 % IV SOLN
2.0000 g | INTRAVENOUS | Status: DC
  Administered 2020-06-27: 2 g via INTRAVENOUS
  Filled 2020-06-27: qty 2

## 2020-06-27 MED ORDER — AZITHROMYCIN 250 MG PO TABS: 500.0000 mg | ORAL_TABLET | Freq: Every day | ORAL | 0 refills | Status: AC

## 2020-06-27 NOTE — TOC Transition Note (Signed)
Transition of Care [facility]) - CM/SW Discharge Note  Patient Details  Name: YEIMI DEBNAM MRN: [id number] Date of Birth: 1945/01/27  [facility]) CM/SW Contact:  Ewing Schlein, LCSW Phone Number: 06/27/2020, 2:02 PM  Clinical Narrative: Patient to discharge home today and HHPT/OT as well as home O2 are recommended. Daughter agreeable to O2 referral through [facility]. CSW made O2 referral to [facility] with [facility]. O2 to be delivered to patient's room. CSW made referrals to the following HH agencies:  [facility]: out-of-network  [facility]: out-of-network, would have copay of $45-$60 per visit [facility]: no PT availability in area next week [facility]: declined, several weeks delay for OT [facility]: no staff availability in [place] [facility]: at [facility] for [facility]: [facility]  [facility] updated daughter that [facility]. Orders are in. TOC signing off.  Final next level of care: Home w Home Health Services Barriers to Discharge: Barriers Resolved  Patient Goals and CMS Choice Patient states their goals for this hospitalization and ongoing recovery are:: Return home CMS Medicare.gov Compare Post Acute Care list provided to:: Patient Represenative (must comment) Choice offered to / list presented to : Adult Children  Discharge Plan and Services         DME Arranged: Oxygen DME Agency: [facility] Date DME Agency Contacted: 06/27/20 Time DME Agency Contacted: [phone] Representative spoke with at DME Agency: Velna Hatchet HH Arranged: PT, OT [facility] Agency: [facility] Date HH Agency Contacted: 06/27/20 Time HH Agency Contacted: [phone] Representative spoke with at [facility] Agency: [facility]rippe  Readmission Risk Interventions Readmission Risk Prevention Plan 12/05/2019  Transportation Screening Complete  PCP or Specialist Appt within 3-5 Days Complete  HRI or Home Care Consult Complete  Social Work Consult for Recovery Care Planning/Counseling Complete   Palliative Care Screening Not Applicable  Medication Review [employer]) Complete  Some recent data might be hidden

## 2020-06-27 NOTE — Evaluation (Signed)
Occupational Therapy Evaluation Patient Details Name: Chelsea [name] MRN: [id number] DOB: June 21, 1945 Today's Date: 06/27/2020    History of Present Illness 75 y.o. female admitted with fever, recent UTI dx, new BLL PNA dx. Pt with medical history significant of Stroke, DM2, HTN, CKD 3, recurrent UTIs, former smoker.   Clinical Impression   Patient lives with daughter in a single level house with 4 steps to enter. At baseline daughter provides supervision for ambulation with walker, assist getting through doorways if in wheelchair and assist for all LB ADLs, set up for UB ADLs. Daughter and patient report would like for her to be more steady with balance and safer with functional transfers to decrease caregiver burden and are open to home health "not [facility]." Currently patient min A for stand pivot from bedside commode to bed and total A in standing for peri care + clothing management. Recommend continued acute OT services to maximize patient safety, balance, strength to reduce caregiver burden and facilitate D/C home with family support.      Follow Up Recommendations  Home health OT;Supervision/Assistance - 24 hour    Equipment Recommendations  None recommended by OT (has all DME at home)       Precautions / Restrictions Precautions Precautions: Fall Restrictions Weight Bearing Restrictions: No      Mobility Bed Mobility Overal bed mobility: Needs Assistance Bed Mobility: Sit to Supine       Sit to supine: Min guard   General bed mobility comments: for safety, increased time to lift LEs onto bed    Transfers Overall transfer level: Needs assistance Equipment used: 1 person hand held assist Transfers: Stand Pivot Transfers   Stand pivot transfers: Min assist       General transfer comment: please see toilet transfer in ADL section    Balance Overall balance assessment: Needs assistance Sitting-balance support: Feet supported Sitting balance-Leahy Scale: Fair      Standing balance support: Bilateral upper extremity supported Standing balance-Leahy Scale: Poor                             ADL either performed or assessed with clinical judgement   ADL Overall ADL's : Needs assistance/impaired Eating/Feeding: Set up;Sitting;Bed level   Grooming: Set up;Sitting;Bed level   Upper Body Bathing: Set up;Supervision/ safety;Sitting   Lower Body Bathing: Maximal assistance;Sitting/lateral leans;Sit to/from stand   Upper Body Dressing : Set up;Supervision/safety;Sitting   Lower Body Dressing: Maximal assistance;Sitting/lateral leans;Sit to/from stand   Toilet Transfer: Minimal assistance;Cueing for sequencing;BSC Toilet Transfer Details (indicate cue type and reason): patient on [facility] upon arrival, hand held assist and cues to pivot toward edge of bed. reliant on UE support Toileting- Clothing Manipulation and Hygiene: Total assistance;Sit to/from stand Toileting - Clothing Manipulation Details (indicate cue type and reason): DTR present and provided peri care in standing while OT steady patient, DTR reports helping her with this at home and would like for her to be able to assist more     Functional mobility during ADLs: Minimal assistance General ADL Comments: patient and daughter open to home health services if they can have different company than previous ([facility]) as they would like patient to be more independent/safe with transfers, toileting      Pertinent Vitals/Pain Pain Assessment: Faces Faces Pain Scale: No hurt     Hand Dominance Right   Extremity/Trunk Assessment Upper Extremity Assessment Upper Extremity Assessment: Generalized weakness   Lower Extremity Assessment Lower Extremity  Assessment: Defer to PT evaluation   Cervical / Trunk Assessment Cervical / Trunk Assessment: Kyphotic   Communication Communication Communication: No difficulties   Cognition Arousal/Alertness: Awake/alert Behavior During Therapy: WFL  for tasks assessed/performed Overall Cognitive Status: History of cognitive impairments - at baseline                                 General Comments: per daughter since CVA has short term memory deficits              Home Living Family/patient expects to be discharged to:: Private residence Living Arrangements: Children Available Help at Discharge: Family;Available 24 hours/day Type of Home: House Home Access: Ramped entrance;Stairs to enter Entrance Stairs-Number of Steps: 4 Entrance Stairs-Rails: Can reach both Home Layout: One level     Bathroom Shower/Tub: Tub/shower unit;Curtain   Bathroom Toilet: Handicapped height (commode over toilet) Bathroom Accessibility: Yes   Home Equipment: Emergency planning/management officer - 2 wheels;Bedside commode;Grab bars - tub/shower;Grab bars - toilet;Other (comment)   Additional Comments: lift chair      Prior Functioning/Environment Level of Independence: Needs assistance  Gait / Transfers Assistance Needed: ambulates with walker with assistance, needs assist through doorways when in wheelchair ADL's / Homemaking Assistance Needed: patient can feed herself, perform grooming/hygiene, wash upper body but daughter assists with lower body ADLs, cutting up her food.   Comments: patient with memory deficits d/t CVA, daughter having to help reminder patient of things        OT Problem List: Decreased strength;Decreased activity tolerance;Impaired balance (sitting and/or standing);Decreased safety awareness      OT Treatment/Interventions: Self-care/ADL training;Therapeutic exercise;Therapeutic activities;Patient/family education;Balance training;Cognitive remediation/compensation;DME and/or AE instruction    OT Goals(Current goals can be found in the care plan section) Acute Rehab OT Goals Patient Stated Goal: home health OT OT Goal Formulation: With patient/family Time For Goal Achievement: 07/11/20 Potential to Achieve Goals: Good   OT Frequency: Min 2X/week    AM-PAC OT "6 Clicks" Daily Activity     Outcome Measure Help from another person eating meals?: A Little Help from another person taking care of personal grooming?: A Little Help from another person toileting, which includes using toliet, bedpan, or urinal?: A Lot Help from another person bathing (including washing, rinsing, drying)?: A Lot Help from another person to put on and taking off regular upper body clothing?: A Little Help from another person to put on and taking off regular lower body clothing?: A Lot 6 Click Score: 15   End of Session Nurse Communication: Mobility status  Activity Tolerance: Patient tolerated treatment well Patient left: in bed;with call bell/phone within reach;with family/visitor present  OT Visit Diagnosis: Unsteadiness on feet (R26.81);Other abnormalities of gait and mobility (R26.89)                Time: 1007-1219 OT Time Calculation (min): 26 min Charges:  OT General Charges $OT Visit: 1 Visit OT Evaluation $OT Eval Low Complexity: 1 Low OT Treatments $Self Care/Home Management : 8-22 mins  Marlyce Huge OT OT pager: [phone]  Carmelia Roller 06/27/2020, 11:08 AM

## 2020-06-27 NOTE — Plan of Care (Signed)
Instructions were reviewed with patient. All questions were answered. Patient was transported to main entrance by wheelchair. ° °

## 2020-06-27 NOTE — Progress Notes (Signed)
SATURATION QUALIFICATIONS: (This note is used to comply with regulatory documentation for home oxygen)  Patient Saturations on Room Air at Rest = 97%  Patient Saturations on Room Air while Ambulating = 86%  Patient Saturations on 2 Liters of oxygen while Ambulating = 99%  Please briefly explain why patient needs home oxygen: During ambulating

## 2020-06-27 NOTE — Discharge Summary (Addendum)
Physician Discharge Summary  BIANA HAGGAR AST:[id number] DOB: 1945/06/23 DOA: 06/25/2020  PCP: Roe Rutherford, NP  Admit date: 06/25/2020 Discharge date: 06/27/2020  Admitted From: Home Disposition: Home with Home Health PT/OT  Recommendations for Outpatient Follow-up:  Follow up with PCP in 1-2 weeks Follow up with Urology Dr. Benancio Deeds within 1 week Follow up with Nephrology within 1-2 weeks Please obtain CMP/CBC, Mag, Phos in one week Repeat CXR in 3-6 weeks  Please follow up on the following pending results:  Home Health: Yes Equipment/Devices: DME Supplemental O2 via [place]  Discharge Condition: Stable CODE STATUS: DO NOT RESUSCITATE  Diet recommendation: Heart Healthy Carb Modified Diet   Brief/Interim Summary: The patient is a 75 year old overweight Caucasian female with a past medical history significant for but not limited to history of CVA, diabetes mellitus type 2, hypertension, CKD stage IIIa, recurrent UTIs and prior tobacco abuse who presented with intermittent fevers for last 1 to 2 weeks.  Symptoms were persistent.  Initially she was diagnosed with a UTI and placed on ciprofloxacin and switched to Macrobid.  Symptoms persisted despite 2 rounds of antibiotics.  Her urine culture on 6 6 grew out E. coli that was sensitive to cephalosporins and resistant to Cipro and sensitive to nitrofurantoin.  Patient denies any dysuria or headache but does have a dry cough.  Initial evaluation showed that she had pulse oximetry of 82% on room air at home.  Because symptoms were intermittent and worsening and not improved with antibiotic courses she presented to the ED and was noted to have a WBC of 22,000.  She had bilateral lower lobe streaky opacities consistent with atelectasis versus pneumonia.  Her COVID and flu testing were negative.  She was given IV azithromycin and Rocephin in the ED with improvement.  Currently she is being treated for community-acquired pneumonia and suspected E. coli  UTI.  Her labs improved significantly however prior to discharge PT OT evaluated and recommending home health.  She underwent a home amatory screen and she will require at least 2 L oxygen as she desaturated down to 86% on ambulation.  She will need a repeat chest x-ray in 3 to 6 weeks and complete antibiotic course and follow-up with PCP, nephrology as well as urology in the outpatient setting.  Discharge Diagnoses:  Principal Problem:   CAP (community acquired pneumonia) Active Problems:   Uncontrolled type 2 diabetes mellitus with hyperglycemia, without long-term current use of insulin (HCC)   Primary hypertension   CKD (chronic kidney disease) stage 3, GFR 30-59 ml/min (HCC)   Malnutrition of moderate degree   Acute respiratory failure with hypoxia Community-acquired pneumonia -Chest x-ray on admission showed "Streaky airspace opacities in the lower lobes may represent atelectasis or scarring."  Initially this was thought to be pneumonia in the source of her infection given that she is hypoxic on admission; she remains hypoxic at discharge and desaturated percent on ambulation but she is improving -Repeat chest x-ray now showing "Cardiac shadow is stable. Aortic calcifications are noted. The lungs are well aerated bilaterally. Bibasilar scarring/atelectasis is seen stable from the prior exam. No sizable effusion  is seen. No focal confluent infiltrate is noted." -SpO2: 97 %; Desaturated to 86% on Ambulation  -Started on antibiotics with IV ceftriaxone and azithromycin we will continue -Cntinuous pulse oximetry maintain O2 saturations greater than 90% -Continue supplemental oxygen via nasal cannula wean O2 as tolerated She will need an ambulatory home O2 screen prior to discharge as well as repeat chest x-ray; chest  x-ray stable -WBC has improved and went from 22.1 is now 8.7 -Blood cultures x2 pending; influenza A/B negative and SARS coronavirus 2 negative -We will add guaifenesin and  breathing treatments as well as flutter valve and incentive spirometer -Follow-up with PCP and repeat chest x-ray in 3 to 6 weeks -We will send on short-term oxygen and see if she can be weaned in the outpatient setting but she does have a history of COPD and bilateral lung scarring -Changed antibiotics to p.o. azithromycin and cefdinir at discharge -May benefit from a pulmonary evaluation outpatient setting   Suspected UTI, present on admission -Recently diagnosed with a E. coli UTI last week and was placed on Cipro but now she is on IV ceftriaxone as above; on review of sensitivities she is sensitive to cephalosporins -Urinalysis showed a hazy appearance with small hemoglobin, large leukocytes, negative nitrites, few bacteria, 6-10 RBCs per high-power field, greater than 50 WBCs with urine culture now pending as it was not obtained on admission -Continue with empiric treatment IV ceftriaxone as she has no dysuria currently and will change to p.o. cefdinir at discharge and complete 7-day course; of review she was allergic to cephalexin but tolerated ceftriaxone just fine so we will use cefdinir   History of acute urinary tension -Check bladder scans every shift straight in and out cath -Did not want a Foley catheter on bladder scan checks   AKI on CKD stage IIIa -Patient's BUNs/creatinine went from 27/1.65 is now 29/1.47 and today it is slightly worse to 33/1.79 -Continue to hold Lasix but resume at discharge -Avoid nephrotoxic medications, contrast dyes, hypotension and renally adjust medications -Discontinued IV fluid hydration with normal saline at 75 mils per hour -Repeat CMP within 1 week and have patient follow-up with PCP and have a referral for nephrology  History of CVA -Continue rosuvastatin 5 mg p.o. daily as well as clopidogrel 75 mg p.o. daily   Hypertension -Continue with lisinopril 10 mg p.o. daily, amlodipine 10 mg p.o. daily and with hydralazine 25 mg p.o. twice daily as  needed for systolic blood pressure greater than 180 or diastolic blood pressure greater than 100 -We will add scheduled hydralazine 25 mg 3 times daily and have patient follow-up with PCP and nephrology for further blood pressure management   Diabetes mellitus type 2 -Metformin -Started on a moderate NovoLog sign scale insulin AC -Continue monitor CBGs; CBGs ranging from 102-170   Normocytic anemia/anemia of chronic kidney disease -Patient's hemoglobin/hematocrit went from 11.4/34.6 and is now 9.4/28.3 yesterday and today it is 9.5/29.2 with -Check anemia panel in the outpatient setting -continue to monitor for signs and symptoms of bleeding; no overt bleeding noted -Repeat CBC within 1 week  Nonsevere/moderate malnutrition in the context of acute illness/injury -Nutritionist was consulted for further evaluation recommendations -Recommending Ensure max protein   Discharge Instructions  Discharge Instructions     Call MD for:  difficulty breathing, headache or visual disturbances   Complete by: As directed    Call MD for:  extreme fatigue   Complete by: As directed    Call MD for:  hives   Complete by: As directed    Call MD for:  persistant dizziness or light-headedness   Complete by: As directed    Call MD for:  persistant nausea and vomiting   Complete by: As directed    Call MD for:  redness, tenderness, or signs of infection (pain, swelling, redness, odor or green/yellow discharge around incision site)   Complete by:  As directed    Call MD for:  severe uncontrolled pain   Complete by: As directed    Call MD for:  temperature >100.4   Complete by: As directed    Diet - low sodium heart healthy   Complete by: As directed    Diet Carb Modified   Complete by: As directed    Discharge instructions   Complete by: As directed    You were cared for by a hospitalist during your hospital stay. If you have any questions about your discharge medications or the care you received  while you were in the hospital after you are discharged, you can call the unit and ask to speak with the hospitalist on call if the hospitalist that took care of you is not available. Once you are discharged, your primary care physician will handle any further medical issues. Please note that NO REFILLS for any discharge medications will be authorized once you are discharged, as it is imperative that you return to your primary care physician (or establish a relationship with a primary care physician if you do not have one) for your aftercare needs so that they can reassess your need for medications and monitor your lab values.  Follow up with PCP, Nephrology, and Urology in the outpatient setting. Take all medications as prescribed. If symptoms change or worsen please return to the ED for evaluation   Increase activity slowly   Complete by: As directed       Allergies as of 06/27/2020       Reactions   Codeine Anaphylaxis, Swelling   Throat swells    Penicillins Anaphylaxis, Swelling   Throat closes    Sulfa Antibiotics Anaphylaxis, Swelling, Other (See Comments)   Lips and eye swell   Benzodiazepines Hives   Lidocaine Hives   Oxycodone-acetaminophen Other (See Comments)   unknown   Trazodone Other (See Comments)   unknown   Cephalexin Other (See Comments)   Unknown. Tolerating rocephin 06/25/20   Gabapentin Nausea And Vomiting   Lipitor [atorvastatin]    Vomiting    Lyrica [pregabalin]    Vomiting         Medication List     STOP taking these medications    metFORMIN 500 MG tablet Commonly known as: GLUCOPHAGE       TAKE these medications    acetaminophen 500 MG tablet Commonly known as: TYLENOL Take 1,000 mg by mouth every 6 (six) hours as needed for moderate pain.   amLODipine 10 MG tablet Commonly known as: NORVASC Take 1 tablet (10 mg total) by mouth daily.   azithromycin 250 MG tablet Commonly known as: ZITHROMAX Take 2 tablets (500 mg total) by mouth  daily for 3 days.   carvedilol 3.125 MG tablet Commonly known as: COREG Take 1 tablet (3.125 mg total) by mouth 2 (two) times daily with a meal.   cefdinir 300 MG capsule Commonly known as: OMNICEF Take 1 capsule (300 mg total) by mouth daily for 4 days.   clopidogrel 75 MG tablet Commonly known as: PLAVIX Take 1 tablet (75 mg total) by mouth daily.   guaiFENesin 600 MG 12 hr tablet Commonly known as: MUCINEX Take 1 tablet (600 mg total) by mouth 2 (two) times daily for 5 days.   hydrALAZINE 25 MG tablet Commonly known as: APRESOLINE Take 1 tablet (25 mg total) by mouth 3 (three) times daily. What changed:  when to take this reasons to take this   lisinopril 10 MG tablet  Commonly known as: ZESTRIL Take 10 mg by mouth daily.   meclizine 25 MG tablet Commonly known as: ANTIVERT Take 25 mg by mouth 3 (three) times daily as needed for dizziness.   senna-docusate 8.6-50 MG tablet Commonly known as: Senokot-S Take 1 tablet by mouth 2 (two) times daily for 10 days.       ASK your doctor about these medications    furosemide 40 MG tablet Commonly known as: LASIX Take 1 tablet (40 mg total) by mouth as needed (swelling).   polyethylene glycol 17 g packet Commonly known as: MIRALAX / GLYCOLAX Take 17 g by mouth daily.   rosuvastatin 5 MG tablet Commonly known as: CRESTOR Take 5 mg every other day for 2 weeks, then increase to 5 mg daily.        Follow-up Information     [facility].   Specialty: Home Health Services Why: PT, OT        [facility] Follow up.   Why: Home oxygen        Roe Rutherford, NP. Call.   Specialty: Adult Health Nurse Practitioner Why: Follow up within 1 week Contact information: [address] [place] [address] [phone]         [place] [place]. Call.   Why: THe office will call to scheudule an appointment Contact information: [address] [place]  [address] [phone]         Belva Agee, MD. Go to.   Specialty: Urology Why: Appointment next week Contact information: [address] [place] [address] [phone]                Allergies  Allergen Reactions   Codeine Anaphylaxis and Swelling    Throat swells     Penicillins Anaphylaxis and Swelling    Throat closes     Sulfa Antibiotics Anaphylaxis, Swelling and Other (See Comments)    Lips and eye swell    Benzodiazepines Hives   Lidocaine Hives   Oxycodone-Acetaminophen Other (See Comments)    unknown     Trazodone Other (See Comments)    unknown     Cephalexin Other (See Comments)    Unknown. Tolerating rocephin 06/25/20   Gabapentin Nausea And Vomiting   Lipitor [Atorvastatin]     Vomiting    Lyrica [Pregabalin]     Vomiting    Consultations: None  Procedures/Studies: DG CHEST PORT 1 VIEW  Result Date: 06/27/2020 CLINICAL DATA:  75 year old female with shortness of breath. EXAM: PORTABLE CHEST 1 VIEW COMPARISON:  Portable chest 06/26/2020 and earlier. FINDINGS: Portable AP semi upright view at 0457 hours. Continued low lung volumes. Curvilinear bibasilar opacity most resembling atelectasis. No pneumothorax or pulmonary edema. Chronic blunting of the right hemidiaphragm appears stable since December and is probably pleural thickening or scarring. Mediastinal contours remain within normal limits. Visualized tracheal air column is within normal limits. Paucity of bowel gas in the upper abdomen. No acute osseous abnormality identified. IMPRESSION: No convincing acute cardiopulmonary abnormality. Low lung volumes, basilar atelectasis, and probable chronic pleural scarring at the right costophrenic angle. Electronically Signed   By: Odessa Fleming M.D.   On: 06/27/2020 06:30   DG CHEST PORT 1 VIEW  Result Date: 06/26/2020 CLINICAL DATA:  Shortness of breath and confusion EXAM: PORTABLE CHEST 1 VIEW COMPARISON:  06/25/2020 FINDINGS: Cardiac  shadow is stable. Aortic calcifications are noted. The lungs are well aerated bilaterally. Bibasilar scarring/atelectasis is  seen stable from the prior exam. No sizable effusion is seen. No focal confluent infiltrate is noted. IMPRESSION: Stable appearance of the chest when compared with the previous day. Electronically Signed   By: Alcide Clever M.D.   On: 06/26/2020 08:11   DG Chest Port 1 View  Result Date: 06/25/2020 CLINICAL DATA:  Fever. EXAM: PORTABLE CHEST 1 VIEW COMPARISON:  December 12, 2019 FINDINGS: Cardiomediastinal silhouette is normal. Mediastinal contours appear intact. Calcific atherosclerotic disease of the aorta. There is no evidence of focal airspace consolidation, pleural effusion or pneumothorax. Streaky airspace opacities in the lower lobes may represent atelectasis or scarring. Osseous structures are without acute abnormality. Soft tissues are grossly normal. IMPRESSION: Streaky airspace opacities in the lower lobes may represent atelectasis or scarring. Electronically Signed   By: Ted Mcalpine M.D.   On: 06/25/2020 15:12    Subjective: Seen and Examined at bedside and she is feeling much better and improved.  Back to her baseline almost.  Denies any lightheadedness or dizziness.  Feels well and denies any nausea or vomiting.  Desaturated slightly on ambulation prior to discharge so she will be sent home on 2 L oxygen.  No other concerns or complaints this time and all questions were answered to the patient's and the patient's daughter satisfaction.  Discharge Exam: Vitals:   06/26/20 2043 06/27/20 0509  BP: (!) 145/63 (!) 189/74  Pulse: 65 (!) 59  Resp: 20 18  Temp: 98.2 F (36.8 C) 98.3 F (36.8 C)  SpO2: 97% 97%   Vitals:   06/26/20 0926 06/26/20 1404 06/26/20 2043 06/27/20 0509  BP: (!) 164/71 (!) 177/61 (!) 145/63 (!) 189/74  Pulse: 63 64 65 (!) 59  Resp: Temp: 98.7 F (37.1 C) 97.9 F (36.6 C) 98.2 F (36.8 C) 98.3 F (36.8 C)  TempSrc:  Oral Oral Oral Oral  SpO2: 98% 98% 97% 97%  Weight:      Height:       General: Pt is alert, awake, not in acute distress Cardiovascular: RRR, S1/S2 +, no rubs, no gallops Respiratory: Diminished bilaterally at the bases, no wheezing, no rhonchi; unlabored breathing Abdominal: Soft, NT, distended slightly secondary by habitus, bowel sounds + Extremities: Slight edema, no cyanosis  The results of significant diagnostics from this hospitalization (including imaging, microbiology, ancillary and laboratory) are listed below for reference.    Microbiology: Recent Results (from the past 240 hour(s))  Resp Panel by RT-PCR (Flu A&B, Covid) Nasopharyngeal Swab     Status: None   Collection Time: 06/25/20  2:39 PM   Specimen: Nasopharyngeal Swab; Nasopharyngeal(NP) swabs in vial transport medium  Result Value Ref Range Status   SARS Coronavirus 2 by RT PCR NEGATIVE NEGATIVE Final    Comment: (NOTE) SARS-CoV-2 target nucleic acids are NOT DETECTED.  The SARS-CoV-2 RNA is generally detectable in upper respiratory specimens during the acute phase of infection. The lowest concentration of SARS-CoV-2 viral copies this assay can detect is 138 copies/mL. A negative result does not preclude SARS-Cov-2 infection and should not be used as the sole basis for treatment or other patient management decisions. A negative result may occur with  improper specimen collection/handling, submission of specimen other than nasopharyngeal swab, presence of viral mutation(s) within the areas targeted by this assay, and inadequate number of viral copies(<138 copies/mL). A negative result must be combined with clinical observations, patient history, and epidemiological information. The expected result is Negative.  Fact Sheet for Patients:  BloggerCourse.com  Fact  Sheet for Healthcare Providers:  SeriousBroker.ithttps://www.fda.gov/media/152162/download  This test is no t yet approved or cleared by the  [place] FDA and  has been authorized for detection and/or diagnosis of SARS-CoV-2 by FDA under an Emergency Use Authorization (EUA). This EUA will remain  in effect (meaning this test can be used) for the duration of the COVID-19 declaration under Section 564(b)(1) of the Act, 21 U.S.C.section 360bbb-3(b)(1), unless the authorization is terminated  or revoked sooner.       Influenza A by PCR NEGATIVE NEGATIVE Final   Influenza B by PCR NEGATIVE NEGATIVE Final    Comment: (NOTE) The Xpert Xpress SARS-CoV-2/FLU/RSV plus assay is intended as an aid in the diagnosis of influenza from Nasopharyngeal swab specimens and should not be used as a sole basis for treatment. Nasal washings and aspirates are unacceptable for Xpert Xpress SARS-CoV-2/FLU/RSV testing.  Fact Sheet for Patients: BloggerCourse.comhttps://www.fda.gov/media/152166/download  Fact Sheet for Healthcare Providers: SeriousBroker.ithttps://www.fda.gov/media/152162/download  This test is not yet approved or cleared by the [place] FDA and has been authorized for detection and/or diagnosis of SARS-CoV-2 by FDA under an Emergency Use Authorization (EUA). This EUA will remain in effect (meaning this test can be used) for the duration of the COVID-19 declaration under Section 564(b)(1) of the Act, 21 U.S.C. section 360bbb-3(b)(1), unless the authorization is terminated or revoked.  Performed at [facility], [address] [place], [place] [address]   Blood culture (routine x 2)     Status: None (Preliminary result)   Collection Time: 06/25/20  4:20 PM   Specimen: BLOOD  Result Value Ref Range Status   Specimen Description   Final    BLOOD LEFT ANTECUBITAL Performed at [facility], [address] [place], [place] [address]    Special Requests   Final    BOTTLES DRAWN AEROBIC AND ANAEROBIC Blood Culture results may not be optimal due to an inadequate volume of blood received in culture  bottles Performed at [facility], [address] [place], [place] [address]    Culture   Final    NO GROWTH 2 DAYS Performed at [facility], [address] [place], [place] [address]    Report Status PENDING  Incomplete  Blood culture (routine x 2)     Status: None (Preliminary result)   Collection Time: 06/25/20  5:23 PM   Specimen: BLOOD  Result Value Ref Range Status   Specimen Description   Final    BLOOD LEFT ANTECUBITAL Performed at [facility], [address] [place], [place] [address]    Special Requests   Final    BOTTLES DRAWN AEROBIC AND ANAEROBIC Blood Culture adequate volume Performed at [facility], [address] [place], [place] [address]    Culture   Final    NO GROWTH 2 DAYS Performed at [facility], [address] [place], [place] [address]    Report Status PENDING  Incomplete    Labs: BNP (last 3 results) No results for input(s): BNP in the last 8760 hours. Basic Metabolic Panel: Recent Labs  Lab 06/25/20 1514 06/26/20 0439 06/27/20 0423  NA 135 140 139  K 3.9 4.0 4.5  CL 100 109 109  CO2 26 27 25   GLUCOSE 179* 149* 116*  BUN 27* 29* 33*  CREATININE 1.65* 1.47* 1.79*  CALCIUM 9.4 8.7* 8.7*  MG  --   --  1.9  PHOS  --   --  3.3  Liver Function Tests: Recent Labs  Lab 06/25/20 1514 06/27/20 0423  AST 15 12*  ALT 11 10  ALKPHOS 92 80  BILITOT 0.7 0.5  PROT 7.1 5.8*  ALBUMIN 4.2 3.3*   No results for input(s): LIPASE, AMYLASE in the last 168 hours. No results for input(s): AMMONIA in the last 168 hours. CBC: Recent Labs  Lab 06/25/20 1514 06/26/20 0439 06/27/20 0423  WBC 22.1* 10.2 8.7  NEUTROABS  --   --  5.0  HGB 11.4* 9.4* 9.5*  HCT 34.6* 28.3* 29.2*  MCV 95.3 95.0 96.4  PLT 371 308 297   Cardiac Enzymes: No results for input(s): CKTOTAL, CKMB, CKMBINDEX, TROPONINI in the last 168 hours. BNP: Invalid input(s): POCBNP CBG: Recent Labs  Lab  06/26/20 1140 06/26/20 1630 06/26/20 2051 06/27/20 0724 06/27/20 1138  GLUCAP 102* 118* 170* 103* 111*   D-Dimer No results for input(s): DDIMER in the last 72 hours. Hgb A1c Recent Labs    06/25/20 1514  HGBA1C 5.9*   Lipid Profile No results for input(s): CHOL, HDL, LDLCALC, TRIG, CHOLHDL, LDLDIRECT in the last 72 hours. Thyroid function studies No results for input(s): TSH, T4TOTAL, T3FREE, THYROIDAB in the last 72 hours.  Invalid input(s): FREET3 Anemia work up No results for input(s): VITAMINB12, FOLATE, FERRITIN, TIBC, IRON, RETICCTPCT in the last 72 hours. Urinalysis    Component Value Date/Time   COLORURINE YELLOW 06/25/2020 2312   APPEARANCEUR HAZY (A) 06/25/2020 2312   LABSPEC 1.004 (L) 06/25/2020 2312   PHURINE 6.0 06/25/2020 2312   GLUCOSEU NEGATIVE 06/25/2020 2312   HGBUR SMALL (A) 06/25/2020 2312   BILIRUBINUR NEGATIVE 06/25/2020 2312   KETONESUR NEGATIVE 06/25/2020 2312   PROTEINUR 30 (A) 06/25/2020 2312   UROBILINOGEN 0.2 09/17/2008 1929   NITRITE NEGATIVE 06/25/2020 2312   LEUKOCYTESUR LARGE (A) 06/25/2020 2312   Sepsis Labs Invalid input(s): PROCALCITONIN,  WBC,  LACTICIDVEN Microbiology Recent Results (from the past 240 hour(s))  Resp Panel by RT-PCR (Flu A&B, Covid) Nasopharyngeal Swab     Status: None   Collection Time: 06/25/20  2:39 PM   Specimen: Nasopharyngeal Swab; Nasopharyngeal(NP) swabs in vial transport medium  Result Value Ref Range Status   SARS Coronavirus 2 by RT PCR NEGATIVE NEGATIVE Final    Comment: (NOTE) SARS-CoV-2 target nucleic acids are NOT DETECTED.  The SARS-CoV-2 RNA is generally detectable in upper respiratory specimens during the acute phase of infection. The lowest concentration of SARS-CoV-2 viral copies this assay can detect is 138 copies/mL. A negative result does not preclude SARS-Cov-2 infection and should not be used as the sole basis for treatment or other patient management decisions. A negative result  may occur with  improper specimen collection/handling, submission of specimen other than nasopharyngeal swab, presence of viral mutation(s) within the areas targeted by this assay, and inadequate number of viral copies(<138 copies/mL). A negative result must be combined with clinical observations, patient history, and epidemiological information. The expected result is Negative.  Fact Sheet for Patients:  BloggerCourse.com  Fact Sheet for Healthcare Providers:  SeriousBroker.it  This test is no t yet approved or cleared by the [place] FDA and  has been authorized for detection and/or diagnosis of SARS-CoV-2 by FDA under an Emergency Use Authorization (EUA). This EUA will remain  in effect (meaning this test can be used) for the duration of the COVID-19 declaration under Section 564(b)(1) of the Act, 21 U.S.C.section 360bbb-3(b)(1), unless the authorization is terminated  or revoked sooner.       Influenza  A by PCR NEGATIVE NEGATIVE Final   Influenza B by PCR NEGATIVE NEGATIVE Final    Comment: (NOTE) The Xpert Xpress SARS-CoV-2/FLU/RSV plus assay is intended as an aid in the diagnosis of influenza from Nasopharyngeal swab specimens and should not be used as a sole basis for treatment. Nasal washings and aspirates are unacceptable for Xpert Xpress SARS-CoV-2/FLU/RSV testing.  Fact Sheet for Patients: BloggerCourse.com  Fact Sheet for Healthcare Providers: SeriousBroker.it  This test is not yet approved or cleared by the [place] FDA and has been authorized for detection and/or diagnosis of SARS-CoV-2 by FDA under an Emergency Use Authorization (EUA). This EUA will remain in effect (meaning this test can be used) for the duration of the COVID-19 declaration under Section 564(b)(1) of the Act, 21 U.S.C. section 360bbb-3(b)(1), unless the authorization is terminated  or revoked.  Performed at [facility], [address] [place], [place] [address]   Blood culture (routine x 2)     Status: None (Preliminary result)   Collection Time: 06/25/20  4:20 PM   Specimen: BLOOD  Result Value Ref Range Status   Specimen Description   Final    BLOOD LEFT ANTECUBITAL Performed at [facility], [address] [place], [place] [address]    Special Requests   Final    BOTTLES DRAWN AEROBIC AND ANAEROBIC Blood Culture results may not be optimal due to an inadequate volume of blood received in culture bottles Performed at [facility], [address] [place], [place] [address]    Culture   Final    NO GROWTH 2 DAYS Performed at [facility], [address], [place], [place] [address]    Report Status PENDING  Incomplete  Blood culture (routine x 2)     Status: None (Preliminary result)   Collection Time: 06/25/20  5:23 PM   Specimen: BLOOD  Result Value Ref Range Status   Specimen Description   Final    BLOOD LEFT ANTECUBITAL Performed at [facility], [address], [place], [place] [address]    Special Requests   Final    BOTTLES DRAWN AEROBIC AND ANAEROBIC Blood Culture adequate volume Performed at [facility], [address], [place], [place] [address]    Culture   Final    NO GROWTH 2 DAYS Performed at [facility], [address] [place], [place] [address]    Report Status PENDING  Incomplete   Time coordinating discharge: 35 minutes  SIGNED:  Merlene Laughter, DO [facility] 06/27/2020, 9:11 PM Pager is on AMION  If 7PM-7AM, please contact night-coverage [email or web address]

## 2020-06-27 NOTE — Progress Notes (Signed)
PHARMACIST - PHYSICIAN COMMUNICATION DR:   Marland Mcalpine CONCERNING: Antibiotic IV to Oral Route Change Policy  RECOMMENDATION: This patient is receiving azithromycin by the intravenous route.  Based on criteria approved by the Pharmacy and Therapeutics Committee, the antibiotic(s) is/are being converted to the equivalent oral dose form(s).   DESCRIPTION: These criteria include: Patient being treated for a respiratory tract infection, urinary tract infection, cellulitis or clostridium difficile associated diarrhea if on metronidazole The patient is not neutropenic and does not exhibit a GI malabsorption state The patient is eating (either orally or via tube) and/or has been taking other orally administered medications for a least 24 hours The patient is improving clinically and has a Tmax < 100.5  If you have questions about this conversion, please contact the Pharmacy Department  []   [phone] )  ( [phone] []   [phone] )  [facility] []   [phone] )  [facility] []   [phone] )  [facility] [x]   [phone] )  [facility]    ( [phone], PharmD 06/27/20 9:16 AM

## 2020-06-27 NOTE — Progress Notes (Signed)
Initial Nutrition Assessment  DOCUMENTATION CODES:   Non-severe (moderate) malnutrition in context of acute illness/injury  INTERVENTION:   -Ensure MAX Protein , each supplement provides 150 kcal and 30 grams of protein  -before she discharges  -Reviewed diet with pt and pt's daughter  NUTRITION DIAGNOSIS:   Moderate Malnutrition related to acute illness, lethargy/confusion (CAP) as evidenced by percent weight loss, mild fat depletion, moderate muscle depletion.  GOAL:   Patient will meet greater than or equal to 90% of their needs  MONITOR:   PO intake, Supplement acceptance, Labs, Weight trends, I & O's  REASON FOR ASSESSMENT:   Malnutrition Screening Tool    ASSESSMENT:   75 year old overweight Caucasian female with a past medical history significant for but not limited to history of CVA, diabetes mellitus type 2, hypertension, CKD stage IIIa, recurrent UTIs and prior tobacco abuse who presented with intermittent fevers for last 1 to 2 weeks.  Admitted for CAP  Patient in room with daughter at bedside. Pt's daughter with questions regarding what diet the pt should follow going home. We reviewed low sodium diet d/t pt's HTN and carbohydrate counting. Pt drinks protein shakes at home. We discussed several different protein supplement options that would be appropriate. Pt is willing to have one prior to discharge today.   Pt states her appetite is better today. PO intakes: 100%. She was not eating well d/t confusion from an UTI PTA. Pt began giving a detailed medical history, reports she really likes the care she receives at [facility]. Provided active listening for patient.  Per weight records, pt has lost 21 lbs since 12/03/19 (13% wt loss x 7 months, significant for time frame). This has occurred following pt having multiple strokes.   Medications: Miralax, Senokot  Labs reviewed:  CBGs: 103-170   NUTRITION - FOCUSED PHYSICAL EXAM:  Flowsheet Row Most Recent Value  Orbital  Region Mild depletion  Upper Arm Region Severe depletion  Thoracic and Lumbar Region Unable to assess  Buccal Region Mild depletion  Temple Region Moderate depletion  Clavicle Bone Region Mild depletion  Clavicle and Acromion Bone Region No depletion  Scapular Bone Region Unable to assess  Dorsal Hand Moderate depletion  Patellar Region Unable to assess  Anterior Thigh Region Unable to assess  Posterior Calf Region Unable to assess  Edema (RD Assessment) None  Hair Reviewed  [thinning]  Eyes Reviewed  Mouth Reviewed  Skin Reviewed       Diet Order:   Diet Order             Diet - low sodium heart healthy           Diet Carb Modified           Diet Carb Modified Fluid consistency: Thin; Room service appropriate? Yes  Diet effective now                   EDUCATION NEEDS:   Education needs have been addressed  Skin:  Skin Assessment: Reviewed RN Assessment  Last BM:  6/15  Height:   Ht Readings from Last 1 Encounters:  06/26/20 5' (1.524 m)    Weight:   Wt Readings from Last 1 Encounters:  06/26/20 61.8 kg    BMI:  Body mass index is 26.61 kg/m.  Estimated Nutritional Needs:   Kcal:  1550-1750  Protein:  75-85g  Fluid:  1.7L/day   Tilda Franco, MS, RD, LDN Inpatient Clinical Dietitian Contact information available via Amion

## 2020-06-28 LAB — URINE CULTURE: Culture: NO GROWTH

## 2020-06-30 LAB — CULTURE, BLOOD (ROUTINE X 2)
Culture: NO GROWTH
Culture: NO GROWTH
Special Requests: ADEQUATE

## 2020-07-01 DIAGNOSIS — J440 Chronic obstructive pulmonary disease with acute lower respiratory infection: Secondary | ICD-10-CM | POA: Diagnosis not present

## 2020-07-01 DIAGNOSIS — E1122 Type 2 diabetes mellitus with diabetic chronic kidney disease: Secondary | ICD-10-CM | POA: Diagnosis not present

## 2020-07-01 DIAGNOSIS — E1165 Type 2 diabetes mellitus with hyperglycemia: Secondary | ICD-10-CM | POA: Diagnosis not present

## 2020-07-01 DIAGNOSIS — H5462 Unqualified visual loss, left eye, normal vision right eye: Secondary | ICD-10-CM | POA: Diagnosis not present

## 2020-07-01 DIAGNOSIS — D631 Anemia in chronic kidney disease: Secondary | ICD-10-CM | POA: Diagnosis not present

## 2020-07-01 DIAGNOSIS — N1831 Chronic kidney disease, stage 3a: Secondary | ICD-10-CM | POA: Diagnosis not present

## 2020-07-01 DIAGNOSIS — I129 Hypertensive chronic kidney disease with stage 1 through stage 4 chronic kidney disease, or unspecified chronic kidney disease: Secondary | ICD-10-CM | POA: Diagnosis not present

## 2020-07-01 DIAGNOSIS — E440 Moderate protein-calorie malnutrition: Secondary | ICD-10-CM | POA: Diagnosis not present

## 2020-07-01 DIAGNOSIS — J181 Lobar pneumonia, unspecified organism: Secondary | ICD-10-CM | POA: Diagnosis not present

## 2020-07-01 DIAGNOSIS — B9620 Unspecified Escherichia coli [E. coli] as the cause of diseases classified elsewhere: Secondary | ICD-10-CM | POA: Diagnosis not present

## 2020-07-01 DIAGNOSIS — J9601 Acute respiratory failure with hypoxia: Secondary | ICD-10-CM | POA: Diagnosis not present

## 2020-07-01 DIAGNOSIS — N390 Urinary tract infection, site not specified: Secondary | ICD-10-CM | POA: Diagnosis not present

## 2020-07-01 DIAGNOSIS — N179 Acute kidney failure, unspecified: Secondary | ICD-10-CM | POA: Diagnosis not present

## 2020-07-04 DIAGNOSIS — N189 Chronic kidney disease, unspecified: Secondary | ICD-10-CM | POA: Diagnosis not present

## 2020-07-04 DIAGNOSIS — Z862 Personal history of diseases of the blood and blood-forming organs and certain disorders involving the immune mechanism: Secondary | ICD-10-CM | POA: Diagnosis not present

## 2020-07-04 DIAGNOSIS — N179 Acute kidney failure, unspecified: Secondary | ICD-10-CM | POA: Diagnosis not present

## 2020-07-04 DIAGNOSIS — N390 Urinary tract infection, site not specified: Secondary | ICD-10-CM | POA: Diagnosis not present

## 2020-07-04 DIAGNOSIS — R600 Localized edema: Secondary | ICD-10-CM | POA: Diagnosis not present

## 2020-07-04 DIAGNOSIS — I63531 Cerebral infarction due to unspecified occlusion or stenosis of right posterior cerebral artery: Secondary | ICD-10-CM | POA: Diagnosis not present

## 2020-07-04 DIAGNOSIS — J189 Pneumonia, unspecified organism: Secondary | ICD-10-CM | POA: Diagnosis not present

## 2020-07-07 DIAGNOSIS — R3914 Feeling of incomplete bladder emptying: Secondary | ICD-10-CM | POA: Diagnosis not present

## 2020-07-07 DIAGNOSIS — Z87440 Personal history of urinary (tract) infections: Secondary | ICD-10-CM | POA: Diagnosis not present

## 2020-07-10 ENCOUNTER — Encounter: Payer: Self-pay | Admitting: Cardiology

## 2020-07-10 DIAGNOSIS — E7800 Pure hypercholesterolemia, unspecified: Secondary | ICD-10-CM | POA: Insufficient documentation

## 2020-07-10 DIAGNOSIS — E118 Type 2 diabetes mellitus with unspecified complications: Secondary | ICD-10-CM | POA: Insufficient documentation

## 2020-07-10 DIAGNOSIS — Z8673 Personal history of transient ischemic attack (TIA), and cerebral infarction without residual deficits: Secondary | ICD-10-CM | POA: Insufficient documentation

## 2020-07-11 DIAGNOSIS — J9601 Acute respiratory failure with hypoxia: Secondary | ICD-10-CM | POA: Diagnosis not present

## 2020-07-11 DIAGNOSIS — D631 Anemia in chronic kidney disease: Secondary | ICD-10-CM | POA: Diagnosis not present

## 2020-07-11 DIAGNOSIS — I129 Hypertensive chronic kidney disease with stage 1 through stage 4 chronic kidney disease, or unspecified chronic kidney disease: Secondary | ICD-10-CM | POA: Diagnosis not present

## 2020-07-11 DIAGNOSIS — J440 Chronic obstructive pulmonary disease with acute lower respiratory infection: Secondary | ICD-10-CM | POA: Diagnosis not present

## 2020-07-11 DIAGNOSIS — H5462 Unqualified visual loss, left eye, normal vision right eye: Secondary | ICD-10-CM | POA: Diagnosis not present

## 2020-07-11 DIAGNOSIS — N179 Acute kidney failure, unspecified: Secondary | ICD-10-CM | POA: Diagnosis not present

## 2020-07-11 DIAGNOSIS — B9620 Unspecified Escherichia coli [E. coli] as the cause of diseases classified elsewhere: Secondary | ICD-10-CM | POA: Diagnosis not present

## 2020-07-11 DIAGNOSIS — E1165 Type 2 diabetes mellitus with hyperglycemia: Secondary | ICD-10-CM | POA: Diagnosis not present

## 2020-07-11 DIAGNOSIS — E440 Moderate protein-calorie malnutrition: Secondary | ICD-10-CM | POA: Diagnosis not present

## 2020-07-11 DIAGNOSIS — E1122 Type 2 diabetes mellitus with diabetic chronic kidney disease: Secondary | ICD-10-CM | POA: Diagnosis not present

## 2020-07-11 DIAGNOSIS — J181 Lobar pneumonia, unspecified organism: Secondary | ICD-10-CM | POA: Diagnosis not present

## 2020-07-11 DIAGNOSIS — N390 Urinary tract infection, site not specified: Secondary | ICD-10-CM | POA: Diagnosis not present

## 2020-07-11 DIAGNOSIS — N1831 Chronic kidney disease, stage 3a: Secondary | ICD-10-CM | POA: Diagnosis not present

## 2020-07-15 NOTE — Progress Notes (Signed)
Office Visit    Patient Name: Chelsea [name] Date of Encounter: 07/16/2020  PCP:  Roe Rutherford, NP   [facility]  Cardiologist:  Jodelle Red, MD  Advanced Practice Provider:  No care team member to display Electrophysiologist:  None    Chief Complaint    Chelsea [name] is a 75 y.o. female with a hx of cardiac arrest, carotid artery occlusion s/p left carotid endarterectomy, hypertension, DM2, CVA, recurrent UTI presents today for hospital follow up   Past Medical History    Past Medical History:  Diagnosis Date   Blind left eye    Cardiac arrest [facility])    Carotid artery occlusion    Diabetes mellitus without complication (HCC)    Hypertension    Stroke [facility])    Past Surgical History:  Procedure Laterality Date   ENDARTERECTOMY Left 12/03/2019   Procedure: LEFT CAROTID ENDARTERECTOMY;  Surgeon: Chuck Hint, MD;  Location[facility] OR;  Service: Vascular;  Laterality: Left;   PATCH ANGIOPLASTY Left 12/03/2019   Procedure: PATCH ANGIOPLASTY Left Carotid Artery;  Surgeon: Chuck Hint, MD;  Location: [facility] OR;  Service: Vascular;  Laterality: Left;    Allergies  Allergies  Allergen Reactions   Codeine Anaphylaxis and Swelling    Throat swells     Penicillins Anaphylaxis and Swelling    Throat closes     Sulfa Antibiotics Anaphylaxis, Swelling and Other (See Comments)    Lips and eye swell    Benzodiazepines Hives   Lidocaine Hives   Oxycodone-Acetaminophen Other (See Comments)    unknown     Trazodone Other (See Comments)    unknown     Cephalexin Other (See Comments)    Unknown. Tolerating rocephin 06/25/20   Gabapentin Nausea And Vomiting   Lipitor [Atorvastatin]     Vomiting    Lyrica [Pregabalin]     Vomiting     History of Present Illness    Chelsea [name] is a 75 y.o. female with a hx of cardiac arrest, carotid artery occlusion s/p left carotid endarterectomy, hypertension, DM2, CVA, recurrent  UTI last seen 06/18/20 by Dr. Cristal Deer.  April 2021 she had an episode where her leg gave out and she could not feel her RLE. This was likely the time of her first stroke.   Echocardiogram 11/07/19 LVEF 60-65%, mild asymmetric LVH, gr1DD, no RWMA, trivial MR, mild aortic valve sclerosis without stenosis, negative bubble study. Carotid duplex 11/02/19 with RICA 1-39% stenosis, LICA 80-99% stenosis  She was seen by Dr. Cristal Deer 06/18/20 for evaluation and management of uncontrolled hypertension. She noted home BP as high as 190-200s. Noted lower extremity edema. She was taking carvedilol only once per day. She was reminded to take Carvedilol twice per day and started on lasix 40mg  daily for 3 days then as-needed. Rosuvastatin was started for cholesterol due to report of intolerance to Atorvastatin.   She was admitted 06/25/20-06/27/20 due to fever and UTI non responsive to both Cipro and Macrobid. Her WBC on admission was 22,000. She was treated for community acquired pneumonia and UTI. She did require oxygen on discharge.   She presents today for follow up with her daughter. She does have HH RN, PT, OT coming out to the home. THey are working on facilitating a repeat CXR for monitoring of pneumonia at home. Notes still some phlegm for which she is taking Guafenesin but no new or worsening shortness of breath. No recurrent fever. She has completed her antibiotics  for her UTI. Notes right ankle edema similar to previous. She is wearing compression stockings and elevating her legs. Has not taken her Lasix since leaving the hospital. No orthopnea, PND. Home systolic blood pressure routinely in the 150s with HR 55-65bpm. She is taking her Hydralazine at 9am, 2pm, and 7pm. We discussed trying to take it closer to 8 hours apart. Notes no lightheadedness, dizziness, nor episodes of hypotension. Her daughter is concerned as her mother snores and has apneic episodes. Has never had sleep study before.    EKGs/Labs/Other Studies Reviewed:   The following studies were reviewed today: US LK[place]E Venous 12/12/2019: RIGHT:  - There is no evidence of deep vein thrombosis in the lower extremity.  - No cystic structure found in the popliteal fossa.     LEFT:  - No evidence of common femoral vein obstruction.   Echo 11/07/2019:  1. Left ventricular ejection fraction, by estimation, is 60 to 65%. The  left ventricle has normal function. The left ventricle has no regional  wall motion abnormalities. There is mild asymmetric left ventricular  hypertrophy of the basal-septal segment.  Left ventricular diastolic parameters are consistent with Grade I  diastolic dysfunction (impaired relaxation). Elevated left ventricular  end-diastolic pressure.   2. Right ventricular systolic function is normal. The right ventricular  size is normal.   3. Left atrial size was moderately dilated.   4. The mitral valve is normal in structure. Trivial mitral valve  regurgitation. No evidence of mitral stenosis. Moderate mitral annular  calcification.   5. The aortic valve is tricuspid. There is mild calcification of the  aortic valve. There is mild thickening of the aortic valve. Aortic valve  regurgitation is not visualized. Mild aortic valve sclerosis is present,  with no evidence of aortic valve  stenosis.   6. The inferior vena cava is normal in size with greater than 50%  respiratory variability, suggesting right atrial pressure of 3 mmHg.   7. Agitated saline contrast bubble study was negative, with no evidence  of any interatrial shunt.   Comparison(s): No prior Echocardiogram.   Conclusion(s)/Recommendation(s): Normal biventricular function without  evidence of hemodynamically significant valvular heart disease. No  intracardiac source of embolism detected on this transthoracic study. A  transesophageal echocardiogram is  recommended to exclude cardiac source of embolism if clinically indicated.   US  Carotid Duplex 11/02/2019: Summary:  Right Carotid: Velocities in the right ICA are consistent with a 1-39%  stenosis.   Left Carotid: Velocities in the left ICA are consistent with a 80-99%  stenosis.   Vertebrals:  Bilateral vertebral arteries demonstrate antegrade flow.  Subclavians: Normal flow hemodynamics were seen in bilateral subclavian arteries  EKG:  No EKG today.  Recent Labs: 06/27/2020: ALT 10; BUN 33; Creatinine, Ser 1.79; Hemoglobin 9.5; Magnesium 1.9; Platelets 297; Potassium 4.5; Sodium 139  Recent Lipid Panel    Component Value Date/Time   CHOL 173 11/08/2019 0406   TRIG 82 11/08/2019 0406   HDL 36 (L) 11/08/2019 0406   CHOLHDL 4.8 11/08/2019 0406   VLDL 16 11/08/2019 0406   LDLCALC 121 (H) 11/08/2019 0406   Home Medications   Current Meds  Medication Sig   acetaminophen (TYLENOL) 500 MG tablet Take 1,000 mg by mouth every 6 (six) hours as needed for moderate pain.   amLODipine (NORVASC) 10 MG tablet Take 1 tablet (10 mg total) by mouth daily.   carvedilol (COREG) 3.125 MG tablet Take 1 tablet (3.125 mg total) by mouth 2 (two)  times daily with a meal.   clopidogrel (PLAVIX) 75 MG tablet Take 1 tablet (75 mg total) by mouth daily.   furosemide (LASIX) 40 MG tablet Take 1 tablet (40 mg total) by mouth as needed (swelling). (Patient taking differently: Take 40 mg by mouth daily as needed for edema.)   hydrALAZINE (APRESOLINE) 50 MG tablet Take 1 tablet (50 mg total) by mouth 3 (three) times daily.   lisinopril (ZESTRIL) 10 MG tablet Take 10 mg by mouth daily.   meclizine (ANTIVERT) 25 MG tablet Take 25 mg by mouth 3 (three) times daily as needed for dizziness.   metFORMIN (GLUCOPHAGE) 500 MG tablet Take 500 mg by mouth as needed.   polyethylene glycol (MIRALAX / GLYCOLAX) 17 g packet Take 17 g by mouth daily. (Patient taking differently: Take 17 g by mouth every 3 (three) days.)   rosuvastatin (CRESTOR) 5 MG tablet Take 5 mg every other day for 2 weeks, then  increase to 5 mg daily. (Patient taking differently: Take 5 mg by mouth See admin instructions. Take 5 mg every other day for 2 weeks, then increase to 5 mg daily.)   tamsulosin (FLOMAX) 0.4 MG CAPS capsule Take 0.4 mg by mouth daily.   [DISCONTINUED] hydrALAZINE (APRESOLINE) 25 MG tablet Take 1 tablet (25 mg total) by mouth 3 (three) times daily.     Review of Systems      All other systems reviewed and are otherwise negative except as noted above.  Physical Exam    VS:  BP (!) 157/64   Pulse (!) 57   Ht 5' (1.524 m)   Wt 132 lb (59.9 kg)   SpO2 100%   BMI 25.78 kg/m  , BMI Body mass index is 25.78 kg/m.  Wt Readings from Last 3 Encounters:  07/16/20 132 lb (59.9 kg)  06/26/20 136 lb 3.9 oz (61.8 kg)  06/18/20 129 lb 12.8 oz (58.9 kg)     GEN: Well nourished, well developed, in no acute distress. HEENT: normal. Neck: Supple, no JVD or masses. Cardiac: RRR, no murmurs, rubs, or gallops. No clubbing, cyanosis, edema.  Radials/PT 2+ and equal bilaterally.  Respiratory:  Respirations regular and unlabored, clear to auscultation bilaterally. GI: Soft, nontender, nondistended. MS: No deformity or atrophy. Skin: Warm and dry, no rash. Neuro:  Strength and sensation are intact. Psych: Normal affect.  Assessment & Plan    History of CVA - Continue Plavix, Rosuvastatin. Continue optimal BP control. No recurrent neurological symptoms.   HLD - Intolerant to Atorvastatin. LDL goal <70. Tolerating Rosuvastatin without difficulty, continue Rosuvastatin 5mg  QD. Consider repeat lipid panel at next follow up as she will have been taking for 2 mos.  Anemia of chronic disease - Denies bleeding. Anticipate this is contributory to fatigue. CBC for monitoring.   Lower extremity edema - Nonpitting right ankle edema. Encouraged her to take a dose of her PRN Lasix today. Continue compression socks and LE elevation.   HTN - BP improved since addition of Hydralazine though not at goal. Continue  Coreg 3.125mg  BID, Lisinopril 10mg  daily, Amlodipine 10mg  daily. Increase Hydralazine to 50mg  TID.    CKD - Referred to nephrology today. BMP today for monitoring of renal function. Pending result, monitor usage of Metformin and Lisinopril carefully.   DM2 -  Continue to follow with PCP. Monitor Metformin usage carefully in setting of CKD. 06/25/20 A1c 5.9.  Carotid artery occlusion - 10/2019 LICA 80-99% and RICA 1-39%.  S/p left carotid endarterectomy 12/03/19. Continue Plavix, Atorvasatin.  Continue to follow with vascular surgery.  Snores / Sleep disordered breathing - Notes non restorative sleep, snoring, and apneic episodes witnessed by daughter. Plan for sleep study. Additional risk factors suggestive of sleep apnea include history of CVA and HTN.    Disposition: Follow up in 6 weeks with Dr. Cristal Deer or APP.  Signed, Alver Sorrow, NP 07/16/2020, 12:42 PM [facility]

## 2020-07-16 ENCOUNTER — Encounter (HOSPITAL_BASED_OUTPATIENT_CLINIC_OR_DEPARTMENT_OTHER): Payer: Self-pay | Admitting: Family

## 2020-07-16 ENCOUNTER — Ambulatory Visit (INDEPENDENT_AMBULATORY_CARE_PROVIDER_SITE_OTHER): Payer: PPO | Admitting: Family

## 2020-07-16 ENCOUNTER — Other Ambulatory Visit: Payer: Self-pay

## 2020-07-16 VITALS — BP 157/64 | HR 57 | Ht 60.0 in | Wt 132.0 lb

## 2020-07-16 DIAGNOSIS — R600 Localized edema: Secondary | ICD-10-CM | POA: Diagnosis not present

## 2020-07-16 DIAGNOSIS — I6522 Occlusion and stenosis of left carotid artery: Secondary | ICD-10-CM | POA: Diagnosis not present

## 2020-07-16 DIAGNOSIS — D638 Anemia in other chronic diseases classified elsewhere: Secondary | ICD-10-CM

## 2020-07-16 DIAGNOSIS — I10 Essential (primary) hypertension: Secondary | ICD-10-CM

## 2020-07-16 DIAGNOSIS — Z8673 Personal history of transient ischemic attack (TIA), and cerebral infarction without residual deficits: Secondary | ICD-10-CM | POA: Diagnosis not present

## 2020-07-16 DIAGNOSIS — E782 Mixed hyperlipidemia: Secondary | ICD-10-CM | POA: Diagnosis not present

## 2020-07-16 DIAGNOSIS — N1832 Chronic kidney disease, stage 3b: Secondary | ICD-10-CM

## 2020-07-16 DIAGNOSIS — R0683 Snoring: Secondary | ICD-10-CM | POA: Diagnosis not present

## 2020-07-16 MED ORDER — HYDRALAZINE HCL 50 MG PO TABS: 50.0000 mg | ORAL_TABLET | Freq: Three times a day (TID) | ORAL | 5 refills | Status: DC

## 2020-07-16 NOTE — Addendum Note (Signed)
Addended by: Alver Sorrow on: 07/16/2020 02:29 PM   Modules accepted: Orders

## 2020-07-16 NOTE — Patient Instructions (Addendum)
Medication Instructions:  Your physician has recommended you make the following change in your medication:   Recommend taking your Lasix this afternoon or tomorrow morning.   CHANGE Hydralazine to 50 mg three times per day -take your doses to as close to 8 hours apart as possible.    *If you need a refill on your cardiac medications before your next appointment, please call your pharmacy*   Lab Work: Your physician recommends that you return for lab work: BMP, CBC Thiswill let us recheck your kidney function.   If you have labs (blood work) drawn today and your tests are completely normal, you will receive your results only by: MyChart Message (if you have MyChart) OR A paper copy in the mail If you have any lab test that is abnormal or we need to change your treatment, we will call you to review the results.  Testing/Procedures: Your physician has recommended that you have a sleep study. This test records several body functions during sleep, including: brain activity, eye movement, oxygen and carbon dioxide blood levels, heart rate and rhythm, breathing rate and rhythm, the flow of air through your mouth and nose, snoring, body muscle movements, and chest and belly movement  Follow-Up: At [facility], you and your health needs are our priority.  As part of our continuing mission to provide you with exceptional heart care, we have created designated Provider Care Teams.  These Care Teams include your primary Cardiologist (physician) and Advanced Practice Providers (APPs -  Physician Assistants and Nurse Practitioners) who all work together to provide you with the care you need, when you need it.  We recommend signing up for the patient portal called "MyChart".  Sign up information is provided on this After Visit Summary.  MyChart is used to connect with patients for Virtual Visits (Telemedicine).  Patients are able to view lab/test results, encounter notes, upcoming appointments, etc.   Non-urgent messages can be sent to your provider as well.   To learn more about what you can do with MyChart, go to [email or web address].    Your next appointment:   6 week(s)  The format for your next appointment:   In Person  Provider:   Dr. Cristal Deer or APP   Other Instructions  Recommend establishing with a primary care provider.  You may call [facility] @ [phone] for a list of primary care providers in your area or visit their website [email or web address] Please have any insurance card available before calling or going online.   Heart Healthy Diet Recommendations: A low-salt diet is recommended. Meats should be grilled, baked, or boiled. Avoid fried foods. Focus on lean protein sources like fish or chicken with vegetables and fruits. The American Heart Association is a Chief Technology Officer!  American Heart Association Diet and Lifeystyle Recommendations     Reports no shortness of breath nor dyspnea on exertion. Reports no chest pain, pressure, or tightness. No edema, orthopnea, PND. Reports no palpitations.

## 2020-07-21 ENCOUNTER — Other Ambulatory Visit: Payer: Self-pay

## 2020-07-21 DIAGNOSIS — D638 Anemia in other chronic diseases classified elsewhere: Secondary | ICD-10-CM | POA: Diagnosis not present

## 2020-07-21 DIAGNOSIS — I10 Essential (primary) hypertension: Secondary | ICD-10-CM | POA: Diagnosis not present

## 2020-07-21 DIAGNOSIS — Z8673 Personal history of transient ischemic attack (TIA), and cerebral infarction without residual deficits: Secondary | ICD-10-CM

## 2020-07-21 DIAGNOSIS — Z961 Presence of intraocular lens: Secondary | ICD-10-CM | POA: Diagnosis not present

## 2020-07-22 ENCOUNTER — Other Ambulatory Visit: Payer: Self-pay | Admitting: *Deleted

## 2020-07-22 LAB — CBC
Hematocrit: 30.4 % — ABNORMAL LOW (ref 34.0–46.6)
Hemoglobin: 9.6 g/dL — ABNORMAL LOW (ref 11.1–15.9)
MCH: 29.9 pg (ref 26.6–33.0)
MCHC: 31.6 g/dL (ref 31.5–35.7)
MCV: 95 fL (ref 79–97)
Platelets: 325 10*3/uL (ref 150–450)
RBC: 3.21 x10E6/uL — ABNORMAL LOW (ref 3.77–5.28)
RDW: 12.5 % (ref 11.7–15.4)
WBC: 6.9 10*3/uL (ref 3.4–10.8)

## 2020-07-22 LAB — BASIC METABOLIC PANEL
BUN/Creatinine Ratio: 18 (ref 12–28)
BUN: 30 mg/dL — ABNORMAL HIGH (ref 8–27)
CO2: 23 mmol/L (ref 20–29)
Calcium: 9.4 mg/dL (ref 8.7–10.3)
Chloride: 99 mmol/L (ref 96–106)
Creatinine, Ser: 1.66 mg/dL — ABNORMAL HIGH (ref 0.57–1.00)
Glucose: 84 mg/dL (ref 65–99)
Potassium: 4.8 mmol/L (ref 3.5–5.2)
Sodium: 138 mmol/L (ref 134–144)
eGFR: 32 mL/min/{1.73_m2} — ABNORMAL LOW (ref >59)

## 2020-07-23 DIAGNOSIS — E1122 Type 2 diabetes mellitus with diabetic chronic kidney disease: Secondary | ICD-10-CM | POA: Diagnosis not present

## 2020-07-23 DIAGNOSIS — N1832 Chronic kidney disease, stage 3b: Secondary | ICD-10-CM | POA: Diagnosis not present

## 2020-07-23 DIAGNOSIS — I639 Cerebral infarction, unspecified: Secondary | ICD-10-CM | POA: Diagnosis not present

## 2020-07-23 DIAGNOSIS — I6529 Occlusion and stenosis of unspecified carotid artery: Secondary | ICD-10-CM | POA: Diagnosis not present

## 2020-07-23 DIAGNOSIS — D631 Anemia in chronic kidney disease: Secondary | ICD-10-CM | POA: Diagnosis not present

## 2020-07-23 DIAGNOSIS — I129 Hypertensive chronic kidney disease with stage 1 through stage 4 chronic kidney disease, or unspecified chronic kidney disease: Secondary | ICD-10-CM | POA: Diagnosis not present

## 2020-07-23 DIAGNOSIS — R600 Localized edema: Secondary | ICD-10-CM | POA: Diagnosis not present

## 2020-07-23 DIAGNOSIS — N390 Urinary tract infection, site not specified: Secondary | ICD-10-CM | POA: Diagnosis not present

## 2020-07-25 ENCOUNTER — Other Ambulatory Visit: Payer: Self-pay | Admitting: Internal Medicine

## 2020-07-25 DIAGNOSIS — N1832 Chronic kidney disease, stage 3b: Secondary | ICD-10-CM

## 2020-07-25 DIAGNOSIS — I159 Secondary hypertension, unspecified: Secondary | ICD-10-CM

## 2020-07-27 DIAGNOSIS — J189 Pneumonia, unspecified organism: Secondary | ICD-10-CM | POA: Diagnosis not present

## 2020-07-29 ENCOUNTER — Telehealth: Payer: Self-pay | Admitting: Cardiology

## 2020-07-29 NOTE — Telephone Encounter (Signed)
Dawn from [facility] states they received a referral for the patient, but she was already seen 7/13 by Dr. Louie Bun. She states he did review the patient's notes from the referral and recommended she follow up in 3 months with him.

## 2020-07-31 DIAGNOSIS — J189 Pneumonia, unspecified organism: Secondary | ICD-10-CM | POA: Diagnosis not present

## 2020-08-03 DIAGNOSIS — I63531 Cerebral infarction due to unspecified occlusion or stenosis of right posterior cerebral artery: Secondary | ICD-10-CM | POA: Diagnosis not present

## 2020-08-06 DIAGNOSIS — I129 Hypertensive chronic kidney disease with stage 1 through stage 4 chronic kidney disease, or unspecified chronic kidney disease: Secondary | ICD-10-CM | POA: Diagnosis not present

## 2020-08-11 DIAGNOSIS — B9620 Unspecified Escherichia coli [E. coli] as the cause of diseases classified elsewhere: Secondary | ICD-10-CM | POA: Diagnosis not present

## 2020-08-11 DIAGNOSIS — J440 Chronic obstructive pulmonary disease with acute lower respiratory infection: Secondary | ICD-10-CM | POA: Diagnosis not present

## 2020-08-11 DIAGNOSIS — H5462 Unqualified visual loss, left eye, normal vision right eye: Secondary | ICD-10-CM | POA: Diagnosis not present

## 2020-08-11 DIAGNOSIS — I129 Hypertensive chronic kidney disease with stage 1 through stage 4 chronic kidney disease, or unspecified chronic kidney disease: Secondary | ICD-10-CM | POA: Diagnosis not present

## 2020-08-11 DIAGNOSIS — J181 Lobar pneumonia, unspecified organism: Secondary | ICD-10-CM | POA: Diagnosis not present

## 2020-08-11 DIAGNOSIS — E1122 Type 2 diabetes mellitus with diabetic chronic kidney disease: Secondary | ICD-10-CM | POA: Diagnosis not present

## 2020-08-11 DIAGNOSIS — E440 Moderate protein-calorie malnutrition: Secondary | ICD-10-CM | POA: Diagnosis not present

## 2020-08-11 DIAGNOSIS — J9601 Acute respiratory failure with hypoxia: Secondary | ICD-10-CM | POA: Diagnosis not present

## 2020-08-11 DIAGNOSIS — N1831 Chronic kidney disease, stage 3a: Secondary | ICD-10-CM | POA: Diagnosis not present

## 2020-08-11 DIAGNOSIS — N179 Acute kidney failure, unspecified: Secondary | ICD-10-CM | POA: Diagnosis not present

## 2020-08-11 DIAGNOSIS — D631 Anemia in chronic kidney disease: Secondary | ICD-10-CM | POA: Diagnosis not present

## 2020-08-11 DIAGNOSIS — N390 Urinary tract infection, site not specified: Secondary | ICD-10-CM | POA: Diagnosis not present

## 2020-08-11 DIAGNOSIS — E1165 Type 2 diabetes mellitus with hyperglycemia: Secondary | ICD-10-CM | POA: Diagnosis not present

## 2020-08-14 ENCOUNTER — Other Ambulatory Visit: Payer: Self-pay | Admitting: Adult Health Nurse Practitioner

## 2020-08-14 ENCOUNTER — Ambulatory Visit
Admission: RE | Admit: 2020-08-14 | Discharge: 2020-08-14 | Disposition: A | Discharge status: Home / Self Care | Payer: PPO | Source: Ambulatory Visit | Attending: Adult Health Nurse Practitioner | Admitting: Adult Health Nurse Practitioner

## 2020-08-14 ENCOUNTER — Ambulatory Visit
Admission: RE | Admit: 2020-08-14 | Discharge: 2020-08-14 | Disposition: A | Discharge status: Home / Self Care | Payer: PPO | Source: Ambulatory Visit | Attending: Internal Medicine | Admitting: Internal Medicine

## 2020-08-14 ENCOUNTER — Other Ambulatory Visit: Payer: Self-pay

## 2020-08-14 DIAGNOSIS — I159 Secondary hypertension, unspecified: Secondary | ICD-10-CM

## 2020-08-14 DIAGNOSIS — R918 Other nonspecific abnormal finding of lung field: Secondary | ICD-10-CM | POA: Diagnosis not present

## 2020-08-14 DIAGNOSIS — Z8701 Personal history of pneumonia (recurrent): Secondary | ICD-10-CM | POA: Diagnosis not present

## 2020-08-14 DIAGNOSIS — Z9189 Other specified personal risk factors, not elsewhere classified: Secondary | ICD-10-CM

## 2020-08-14 DIAGNOSIS — N189 Chronic kidney disease, unspecified: Secondary | ICD-10-CM | POA: Diagnosis not present

## 2020-08-14 DIAGNOSIS — N1832 Chronic kidney disease, stage 3b: Secondary | ICD-10-CM

## 2020-08-14 DIAGNOSIS — I517 Cardiomegaly: Secondary | ICD-10-CM | POA: Diagnosis not present

## 2020-08-18 DIAGNOSIS — Z87440 Personal history of urinary (tract) infections: Secondary | ICD-10-CM | POA: Diagnosis not present

## 2020-08-18 DIAGNOSIS — R3914 Feeling of incomplete bladder emptying: Secondary | ICD-10-CM | POA: Diagnosis not present

## 2020-08-20 DIAGNOSIS — N179 Acute kidney failure, unspecified: Secondary | ICD-10-CM | POA: Diagnosis not present

## 2020-08-26 NOTE — Progress Notes (Deleted)
Office Visit    Patient Name: Chelsea [name] Date of Encounter: 08/26/2020  PCP:  Roe Rutherford, NP    [facility]  Cardiologist:  Jodelle Red, MD  Advanced Practice Provider:  No care team member to display Electrophysiologist:  None    Chief Complaint    Chelsea [name] is a 75 y.o. female with a hx of cardiac arrest, carotid artery occlusion s/p left carotid endarterectomy, hypertension, DM2, CVA, recurrent UTI presents today for hypertension follow up.  Past Medical History    Past Medical History:  Diagnosis Date   Blind left eye    Cardiac arrest [facility])    Carotid artery occlusion    Diabetes mellitus without complication (HCC)    Hypertension    Stroke [facility])    Past Surgical History:  Procedure Laterality Date   ENDARTERECTOMY Left 12/03/2019   Procedure: LEFT CAROTID ENDARTERECTOMY;  Surgeon: Chuck Hint, MD;  Location: [facility] OR;  Service: Vascular;  Laterality: Left;   PATCH ANGIOPLASTY Left 12/03/2019   Procedure: PATCH ANGIOPLASTY Left Carotid Artery;  Surgeon: Chuck Hint, MD;  Location: [facility] OR;  Service: Vascular;  Laterality: Left;    Allergies  Allergies  Allergen Reactions   Codeine Anaphylaxis and Swelling    Throat swells     Penicillins Anaphylaxis and Swelling    Throat closes     Sulfa Antibiotics Anaphylaxis, Swelling and Other (See Comments)    Lips and eye swell    Benzodiazepines Hives   Lidocaine Hives   Oxycodone-Acetaminophen Other (See Comments)    unknown     Trazodone Other (See Comments)    unknown     Cephalexin Other (See Comments)    Unknown. Tolerating rocephin 06/25/20   Gabapentin Nausea And Vomiting   Lipitor [Atorvastatin]     Vomiting    Lyrica [Pregabalin]     Vomiting     History of Present Illness    Chelsea [name] is a 75 y.o. female with a hx of cardiac arrest, carotid artery occlusion s/p left carotid endarterectomy, hypertension, DM2, CVA,  recurrent UTI last seen 06/18/20 by Dr. Cristal Deer.  April 2021 she had an episode where her leg gave out and she could not feel her RLE. This was likely the time of her first stroke.   Echocardiogram 11/07/19 LVEF 60-65%, mild asymmetric LVH, gr1DD, no RWMA, trivial MR, mild aortic valve sclerosis without stenosis, negative bubble study. Carotid duplex 11/02/19 with RICA 1-39% stenosis, LICA 80-99% stenosis  She was seen by Dr. Cristal Deer 06/18/20 for evaluation and management of uncontrolled hypertension. She noted home BP as high as 190-200s. Noted lower extremity edema. She was taking carvedilol only once per day. She was reminded to take Carvedilol twice per day and started on lasix 40mg  daily for 3 days then as-needed. Rosuvastatin was started for cholesterol due to report of intolerance to Atorvastatin.   She was admitted 06/25/20-06/27/20 due to fever and UTI non responsive to both Cipro and Macrobid. Her WBC on admission was 22,000. She was treated for community acquired pneumonia and UTI. She did require oxygen on discharge.   Seen in follow up 07/16/20.  Notes right ankle edema similar to previous and was recommended to take a dose of PRN Lasix. She was also recommended to space her Hydralazine doses to 8 hours apart. Given uncontrolled BP, Hydralazine was increased to 50mg  TID. Sleep study ordered due to snoring and reported apneic episodes.   *** EKGs/Labs/Other Studies Reviewed:  The following studies were reviewed today: [place] LE Venous 12/12/2019: RIGHT:  - There is no evidence of deep vein thrombosis in the lower extremity.  - No cystic structure found in the popliteal fossa.     LEFT:  - No evidence of common femoral vein obstruction.   Echo 11/07/2019:  1. Left ventricular ejection fraction, by estimation, is 60 to 65%. The  left ventricle has normal function. The left ventricle has no regional  wall motion abnormalities. There is mild asymmetric left ventricular  hypertrophy of  the basal-septal segment.  Left ventricular diastolic parameters are consistent with Grade I  diastolic dysfunction (impaired relaxation). Elevated left ventricular  end-diastolic pressure.   2. Right ventricular systolic function is normal. The right ventricular  size is normal.   3. Left atrial size was moderately dilated.   4. The mitral valve is normal in structure. Trivial mitral valve  regurgitation. No evidence of mitral stenosis. Moderate mitral annular  calcification.   5. The aortic valve is tricuspid. There is mild calcification of the  aortic valve. There is mild thickening of the aortic valve. Aortic valve  regurgitation is not visualized. Mild aortic valve sclerosis is present,  with no evidence of aortic valve  stenosis.   6. The inferior vena cava is normal in size with greater than 50%  respiratory variability, suggesting right atrial pressure of 3 mmHg.   7. Agitated saline contrast bubble study was negative, with no evidence  of any interatrial shunt.   Comparison(s): No prior Echocardiogram.   Conclusion(s)/Recommendation(s): Normal biventricular function without  evidence of hemodynamically significant valvular heart disease. No  intracardiac source of embolism detected on this transthoracic study. A  transesophageal echocardiogram is  recommended to exclude cardiac source of embolism if clinically indicated.   US Carotid Duplex 11/02/2019: Summary:  Right Carotid: Velocities in the right ICA are consistent with a 1-39%  stenosis.   Left Carotid: Velocities in the left ICA are consistent with a 80-99%  stenosis.   Vertebrals:  Bilateral vertebral arteries demonstrate antegrade flow.  Subclavians: Normal flow hemodynamics were seen in bilateral subclavian arteries  EKG:  No EKG today.  Recent Labs: 06/27/2020: ALT 10; Magnesium 1.9 07/21/2020: BUN 30; Creatinine, Ser 1.66; Hemoglobin 9.6; Platelets 325; Potassium 4.8; Sodium 138  Recent Lipid Panel     Component Value Date/Time   CHOL 173 11/08/2019 0406   TRIG 82 11/08/2019 0406   HDL 36 (L) 11/08/2019 0406   CHOLHDL 4.8 11/08/2019 0406   VLDL 16 11/08/2019 0406   LDLCALC 121 (H) 11/08/2019 0406   Home Medications   No outpatient medications have been marked as taking for the 08/27/20 encounter (Appointment) with Alver Sorrow, NP.     Review of Systems      All other systems reviewed and are otherwise negative except as noted above.  Physical Exam    VS:  There were no vitals taken for this visit. , BMI There is no height or weight on file to calculate BMI.  Wt Readings from Last 3 Encounters:  07/16/20 132 lb (59.9 kg)  06/26/20 136 lb 3.9 oz (61.8 kg)  06/18/20 129 lb 12.8 oz (58.9 kg)     GEN: Well nourished, well developed, in no acute distress. HEENT: normal. Neck: Supple, no JVD or masses. Cardiac: RRR, no murmurs, rubs, or gallops. No clubbing, cyanosis, edema.  Radials/PT 2+ and equal bilaterally.  Respiratory:  Respirations regular and unlabored, clear to auscultation bilaterally. GI: Soft, nontender, nondistended. MS:  No deformity or atrophy. Skin: Warm and dry, no rash. Neuro:  Strength and sensation are intact. Psych: Normal affect.  Assessment & Plan    History of CVA - Continue Plavix, Rosuvastatin. Continue optimal BP control. No recurrent neurological symptoms. ***  HLD - Intolerant to Atorvastatin. LDL goal <70. Tolerating Rosuvastatin without difficulty, continue Rosuvastatin 5mg  QD. Consider repeat lipid panel at next follow up as she will have been taking for 2 mos.***  Anemia of chronic disease - ***  Lower extremity edema - ***  HTN - ***   CKD - Continue to follow with nephrology, Dr. . ***   DM2 -  Continue to follow with PCP. Monitor Metformin usage carefully in setting of CKD. 06/25/20 A1c 5.9.***  Carotid artery occlusion - 10/2019 LICA 80-99% and RICA 1-39%.  S/p left carotid endarterectomy 12/03/19. Continue Plavix,  Atorvasatin. Continue to follow with vascular surgery.***  Snores / Sleep disordered breathing - Notes non restorative sleep, snoring, and apneic episodes witnessed by daughter. Sleep study previously ordered. Additional risk factors suggestive of sleep apnea include history of CVA and HTN. ***   Disposition: Follow up ***with Dr. 12/05/19 or APP.  Signed, Cristal Deer, NP 08/26/2020, 7:47 PM [facility]

## 2020-08-27 ENCOUNTER — Ambulatory Visit (HOSPITAL_BASED_OUTPATIENT_CLINIC_OR_DEPARTMENT_OTHER): Payer: PPO | Admitting: Family

## 2020-08-27 DIAGNOSIS — J189 Pneumonia, unspecified organism: Secondary | ICD-10-CM | POA: Diagnosis not present

## 2020-08-31 DIAGNOSIS — J189 Pneumonia, unspecified organism: Secondary | ICD-10-CM | POA: Diagnosis not present

## 2020-09-03 DIAGNOSIS — I63531 Cerebral infarction due to unspecified occlusion or stenosis of right posterior cerebral artery: Secondary | ICD-10-CM | POA: Diagnosis not present

## 2020-09-12 ENCOUNTER — Other Ambulatory Visit: Payer: Self-pay

## 2020-09-12 ENCOUNTER — Encounter (HOSPITAL_BASED_OUTPATIENT_CLINIC_OR_DEPARTMENT_OTHER): Payer: Self-pay | Admitting: Family

## 2020-09-12 ENCOUNTER — Ambulatory Visit (HOSPITAL_BASED_OUTPATIENT_CLINIC_OR_DEPARTMENT_OTHER): Payer: PPO | Admitting: Family

## 2020-09-12 VITALS — BP 140/66 | HR 58 | Ht 65.0 in | Wt 138.6 lb

## 2020-09-12 DIAGNOSIS — R600 Localized edema: Secondary | ICD-10-CM | POA: Diagnosis not present

## 2020-09-12 DIAGNOSIS — R0683 Snoring: Secondary | ICD-10-CM

## 2020-09-12 DIAGNOSIS — G4730 Sleep apnea, unspecified: Secondary | ICD-10-CM | POA: Diagnosis not present

## 2020-09-12 DIAGNOSIS — I6523 Occlusion and stenosis of bilateral carotid arteries: Secondary | ICD-10-CM | POA: Diagnosis not present

## 2020-09-12 DIAGNOSIS — N1832 Chronic kidney disease, stage 3b: Secondary | ICD-10-CM

## 2020-09-12 DIAGNOSIS — E785 Hyperlipidemia, unspecified: Secondary | ICD-10-CM

## 2020-09-12 DIAGNOSIS — Z8673 Personal history of transient ischemic attack (TIA), and cerebral infarction without residual deficits: Secondary | ICD-10-CM

## 2020-09-12 DIAGNOSIS — I10 Essential (primary) hypertension: Secondary | ICD-10-CM | POA: Diagnosis not present

## 2020-09-12 MED ORDER — MEDICAL COMPRESSION SOCKS MISC: 1.0000 [IU] | Freq: Every day | 0 refills | Status: DC

## 2020-09-12 MED ORDER — HYDRALAZINE HCL 50 MG PO TABS: 50.0000 mg | ORAL_TABLET | Freq: Three times a day (TID) | ORAL | 5 refills | Status: DC

## 2020-09-12 MED ORDER — AMLODIPINE BESYLATE 10 MG PO TABS: 10.0000 mg | ORAL_TABLET | Freq: Every day | ORAL | 5 refills | Status: DC

## 2020-09-12 NOTE — Progress Notes (Signed)
Office Visit    Patient Name: Chelsea [name] Date of Encounter: 09/12/2020  PCP:  Chelsea Rutherford, NP   [facility]  Cardiologist:  Chelsea Red, MD  Advanced Practice Provider:  No care team member to display Electrophysiologist:  None    Chief Complaint    Chelsea [name] is a 75 y.o. female with a hx of cardiac arrest, carotid artery occlusion s/p left carotid endarterectomy, hypertension, DM2, CVA, recurrent UTI presents today for hypertension follow up.  Past Medical History    Past Medical History:  Diagnosis Date   Blind left eye    Cardiac arrest [facility])    Carotid artery occlusion    Diabetes mellitus without complication (HCC)    Hypertension    Stroke [facility])    Past Surgical History:  Procedure Laterality Date   ENDARTERECTOMY Left 12/03/2019   Procedure: LEFT CAROTID ENDARTERECTOMY;  Surgeon: Chelsea Hint, MD;  Location: [facility] OR;  Service: Vascular;  Laterality: Left;   PATCH ANGIOPLASTY Left 12/03/2019   Procedure: PATCH ANGIOPLASTY Left Carotid Artery;  Surgeon: Chelsea Hint, MD;  Location: [facility] OR;  Service: Vascular;  Laterality: Left;    Allergies  Allergies  Allergen Reactions   Codeine Anaphylaxis and Swelling    Throat swells     Penicillins Anaphylaxis and Swelling    Throat closes     Sulfa Antibiotics Anaphylaxis, Swelling and Other (See Comments)    Lips and eye swell    Benzodiazepines Hives   Lidocaine Hives   Oxycodone-Acetaminophen Other (See Comments)    unknown     Trazodone Other (See Comments)    unknown     Cephalexin Other (See Comments)    Unknown. Tolerating rocephin 06/25/20   Gabapentin Nausea And Vomiting   Lipitor [Atorvastatin]     Vomiting    Lyrica [Pregabalin]     Vomiting     History of Present Illness    Chelsea [name] is a 75 y.o. female with a hx of cardiac arrest, carotid artery occlusion s/p left carotid endarterectomy, hypertension, DM2, CVA,  recurrent UTI last seen 06/18/20 by Dr. Cristal [name].  April 2021 she had an episode where her leg gave out and she could not feel her RLE. This was likely the time of her first stroke.   Echocardiogram 11/07/19 LVEF 60-65%, mild asymmetric LVH, gr1DD, no RWMA, trivial MR, mild aortic valve sclerosis without stenosis, negative bubble study. Carotid duplex 11/02/19 with RICA 1-39% stenosis, LICA 80-99% stenosis  She was seen by Dr. Cristal [name] 06/18/20 for evaluation and management of uncontrolled hypertension. She noted home BP as high as 190-200s. Noted lower extremity edema. She was taking carvedilol only once per day. She was reminded to take Carvedilol twice per day and started on lasix 40mg  daily for 3 days then as-needed. Rosuvastatin was started for cholesterol due to report of intolerance to Atorvastatin.   She was admitted 06/25/20-06/27/20 due to fever and UTI non responsive to both Cipro and Macrobid. Her WBC on admission was 22,000. She was treated for community acquired pneumonia and UTI. She did require oxygen on discharge.   Seen in follow up 07/16/20.  Notes right ankle edema similar to previous and was recommended to take a dose of PRN Lasix. She was also recommended to space her Hydralazine doses to 8 hours apart. Given uncontrolled BP, Hydralazine was increased to 50mg  TID. Sleep study ordered due to snoring and reported apneic episodes.   She presents today for follow  up with her daughter. Has completed PT/OT and is pleased with her progress. Notes right ankle edema. Her nephrologist switched her from Lisinopril to Lisinopril-HCTZ and asked her to be cautious regarding PRN Lasix so has not taken recently. She is elevating legs and wearing compression stockings. Notes compression stockings have stretched out and requests new Rx to get new ones. No recurrent UTI since starting Tamsulosin by urology. Notes no chest pain and that her dyspnea is overall improving. No orthopnea, PND. Still snoring  and with apneic episodes witnessed by daughter, sleep study not yet completed.   EKGs/Labs/Other Studies Reviewed:   The following studies were reviewed today: [place] LE Venous 12/12/2019: RIGHT:  - There is no evidence of deep vein thrombosis in the lower extremity.  - No cystic structure found in the popliteal fossa.     LEFT:  - No evidence of common femoral vein obstruction.   Echo 11/07/2019:  1. Left ventricular ejection fraction, by estimation, is 60 to 65%. The  left ventricle has normal function. The left ventricle has no regional  wall motion abnormalities. There is mild asymmetric left ventricular  hypertrophy of the basal-septal segment.  Left ventricular diastolic parameters are consistent with Grade I  diastolic dysfunction (impaired relaxation). Elevated left ventricular  end-diastolic pressure.   2. Right ventricular systolic function is normal. The right ventricular  size is normal.   3. Left atrial size was moderately dilated.   4. The mitral valve is normal in structure. Trivial mitral valve  regurgitation. No evidence of mitral stenosis. Moderate mitral annular  calcification.   5. The aortic valve is tricuspid. There is mild calcification of the  aortic valve. There is mild thickening of the aortic valve. Aortic valve  regurgitation is not visualized. Mild aortic valve sclerosis is present,  with no evidence of aortic valve  stenosis.   6. The inferior vena cava is normal in size with greater than 50%  respiratory variability, suggesting right atrial pressure of 3 mmHg.   7. Agitated saline contrast bubble study was negative, with no evidence  of any interatrial shunt.   Comparison(s): No prior Echocardiogram.   Conclusion(s)/Recommendation(s): Normal biventricular function without  evidence of hemodynamically significant valvular heart disease. No  intracardiac source of embolism detected on this transthoracic study. A  transesophageal echocardiogram is   recommended to exclude cardiac source of embolism if clinically indicated.   US Carotid Duplex 11/02/2019: Summary:  Right Carotid: Velocities in the right ICA are consistent with a 1-39%  stenosis.   Left Carotid: Velocities in the left ICA are consistent with a 80-99%  stenosis.   Vertebrals:  Bilateral vertebral arteries demonstrate antegrade flow.  Subclavians: Normal flow hemodynamics were seen in bilateral subclavian arteries  EKG:  No EKG today.  Recent Labs: 06/27/2020: ALT 10; Magnesium 1.9 07/21/2020: BUN 30; Creatinine, Ser 1.66; Hemoglobin 9.6; Platelets 325; Potassium 4.8; Sodium 138  Recent Lipid Panel    Component Value Date/Time   CHOL 173 11/08/2019 0406   TRIG 82 11/08/2019 0406   HDL 36 (L) 11/08/2019 0406   CHOLHDL 4.8 11/08/2019 0406   VLDL 16 11/08/2019 0406   LDLCALC 121 (H) 11/08/2019 0406   Home Medications   Current Meds  Medication Sig   acetaminophen (TYLENOL) 500 MG tablet Take 1,000 mg by mouth every 6 (six) hours as needed for moderate pain.   amLODipine (NORVASC) 10 MG tablet Take 1 tablet (10 mg total) by mouth daily.   Bisacodyl (LAXATIVE PO) Take by mouth  as directed.   carvedilol (COREG) 3.125 MG tablet Take 1 tablet (3.125 mg total) by mouth 2 (two) times daily with a meal.   clopidogrel (PLAVIX) 75 MG tablet Take 1 tablet (75 mg total) by mouth daily.   Ferrous Sulfate (IRON) 325 (65 Fe) MG TABS Take by mouth as directed.   furosemide (LASIX) 40 MG tablet Take 1 tablet (40 mg total) by mouth as needed (swelling). (Patient taking differently: Take 40 mg by mouth daily as needed for edema.)   guaiFENesin (MUCINEX) 600 MG 12 hr tablet Take 600 mg by mouth 2 (two) times daily.   hydrALAZINE (APRESOLINE) 50 MG tablet Take 1 tablet (50 mg total) by mouth 3 (three) times daily.   lisinopril-hydrochlorothiazide (ZESTORETIC) 20-25 MG tablet Take 1 tablet by mouth daily.   meclizine (ANTIVERT) 25 MG tablet Take 25 mg by mouth 3 (three) times daily  as needed for dizziness.   metFORMIN (GLUCOPHAGE) 500 MG tablet Take 500 mg by mouth as needed.   Probiotic Product (PROBIOTIC PO) Take by mouth as directed.   rosuvastatin (CRESTOR) 5 MG tablet Take 5 mg every other day for 2 weeks, then increase to 5 mg daily. (Patient taking differently: Take 5 mg by mouth See admin instructions. Take 5 mg every other day for 2 weeks, then increase to 5 mg daily.)   tamsulosin (FLOMAX) 0.4 MG CAPS capsule Take 0.4 mg by mouth daily.     Review of Systems      All other systems reviewed and are otherwise negative except as noted above.  Physical Exam    VS:  BP 140/66   Pulse (!) 58   Ht 5\' 5"  (1.651 m)   Wt 138 lb 9.6 oz (62.9 kg)   SpO2 98%   BMI 23.06 kg/m  , BMI Body mass index is 23.06 kg/m.  Wt Readings from Last 3 Encounters:  09/12/20 138 lb 9.6 oz (62.9 kg)  07/16/20 132 lb (59.9 kg)  06/26/20 136 lb 3.9 oz (61.8 kg)     GEN: Well nourished, well developed, in no acute distress. HEENT: normal. Neck: Supple, no JVD or masses. Cardiac: RRR, no murmurs, rubs, or gallops. No clubbing, cyanosis, edema.  Radials/PT 2+ and equal bilaterally.  Respiratory:  Respirations regular and unlabored, clear to auscultation bilaterally. GI: Soft, nontender, nondistended. MS: No deformity or atrophy. Skin: Warm and dry, no rash. Neuro:  Strength and sensation are intact. Psych: Normal affect.  Assessment & Plan    History of CVA - Continue Plavix, Rosuvastatin. Continue optimal BP control. No recurrent neurological symptoms.   HLD - Intolerant to Atorvastatin. LDL goal <70. Tolerating Rosuvastatin without difficulty, continue Rosuvastatin 5mg  QD.   Anemia of chronic disease - Continue to follow with PCP. Establishing with new PCP later this month. Reports no hematuria nor melena.   Lower extremity edema - 1+ pitting edema to right ankle. Encouraged to take dose of PRN Lasix. Prescription for knee high compression socks 15-7220mmHg  provided.  HTN - BP mildly elevated but much improved compared to previous. Hesitant to lower BP too dramatically due to risk for hypotension. We will continue her present antihypertensive regimen. If needed in the future, her dose of Hydralazine could be increased.    CKD - Continue to follow with nephrology, Dr. Valentino NosePeeples. Careful titration of diuretic and antihypertensive.     DM2 -  Continue to follow with PCP. 06/25/20 A1c 5.9. not taking Metformin at this time. If additional agent needed, consider SGLT2i for  cardioprotective and renal protective benefit.   Carotid artery occlusion - 10/2019 LICA 80-99% and RICA 1-39%.  S/p left carotid endarterectomy 12/03/19. Continue Plavix, Atorvasatin. Continue to follow with vascular surgery. Upcoming duplex next month for monitoring.   Snores / Sleep disordered breathing - Notes non restorative sleep, snoring, and apneic episodes witnessed by daughter. STOPBang of 5. Additional risk factors suggestive of sleep apnea include history of CVA and HTN. WatchPAT home sleep study provided today.    Disposition: Follow up in 3 months with Dr. Cristal [name] or APP.  Signed, Alver Sorrow, NP 09/12/2020, 2:16 PM [facility]

## 2020-09-12 NOTE — Patient Instructions (Addendum)
Medication Instructions:  Continue your current medications.   *If you need a refill on your cardiac medications before your next appointment, please call your pharmacy*  Lab Work: None ordered today.   Testing/Procedures: Your provider has recommended a home sleep study.   Follow-Up: At [facility], you and your health needs are our priority.  As part of our continuing mission to provide you with exceptional heart care, we have created designated Provider Care Teams.  These Care Teams include your primary Cardiologist (physician) and Advanced Practice Providers (APPs -  Physician Assistants and Nurse Practitioners) who all work together to provide you with the care you need, when you need it.  We recommend signing up for the patient portal called "MyChart".  Sign up information is provided on this After Visit Summary.  MyChart is used to connect with patients for Virtual Visits (Telemedicine).  Patients are able to view lab/test results, encounter notes, upcoming appointments, etc.  Non-urgent messages can be sent to your provider as well.   To learn more about what you can do with MyChart, go to [email or web address].    Your next appointment:   3 month(s)  The format for your next appointment:   In Person  Provider:   Jodelle Red, MD   Other Instructions  Heart Healthy Diet Recommendations: A low-salt diet is recommended. Meats should be grilled, baked, or boiled. Avoid fried foods. Focus on lean protein sources like fish or chicken with vegetables and fruits. The American Heart Association is a Chief Technology Officer!    Exercise recommendations: The American Heart Association recommends 150 minutes of moderate intensity exercise weekly. Try 30 minutes of moderate intensity exercise 4-5 times per week. This could include walking, jogging, or swimming.  WatchPAT?  Is a FDA cleared portable home sleep study test that uses a watch and 3 points of contact to monitor 7  different channels, including your heart rate, oxygen saturations, body position, snoring, and chest motion.  The study is easy to use from the comfort of your own home and accurately detect sleep apnea.  Before bed, you attach the chest sensor, attached the sleep apnea bracelet to your nondominant hand, and attach the finger probe.  After the study, the raw data is downloaded from the watch and scored for apnea events.   For more information: [email or web address]

## 2020-09-17 ENCOUNTER — Telehealth: Payer: Self-pay | Admitting: *Deleted

## 2020-09-17 NOTE — Telephone Encounter (Signed)
-----   Message from Jeanene Erb sent at 09/17/2020 10:27 AM EDT ----- Regarding: Chelsea [name] One Need pre-cert please. Thank you! Given 09-12-20

## 2020-09-17 NOTE — Telephone Encounter (Signed)
Prior Authorization for [employer] sent to [employer] via web portal. Tracking Number [id number].

## 2020-09-19 NOTE — Telephone Encounter (Signed)
APPROVED [id number] VALID 09/17/20- 12/16/20

## 2020-09-19 NOTE — Telephone Encounter (Signed)
Chelsea [name], CMA sent to [name] D Since this is a home sleep test his insurance does not require a PA . Ok to activate.    Attempted to reach pt via telephone. Unable to reach, no vm set up. Pt needs to know Ok to activate home sleep study with PIN #: [id number].

## 2020-09-22 NOTE — Telephone Encounter (Signed)
Attempted to contact patient to let him know that his [facility] Sleep Study has been approved but there was no answer and VM not set up. Patient will also need PIN# [id number].

## 2020-09-27 DIAGNOSIS — J189 Pneumonia, unspecified organism: Secondary | ICD-10-CM | POA: Diagnosis not present

## 2020-09-29 ENCOUNTER — Ambulatory Visit (INDEPENDENT_AMBULATORY_CARE_PROVIDER_SITE_OTHER): Payer: PPO | Admitting: Family Medicine

## 2020-09-29 ENCOUNTER — Encounter: Payer: Self-pay | Admitting: Family Medicine

## 2020-09-29 ENCOUNTER — Other Ambulatory Visit: Payer: Self-pay

## 2020-09-29 VITALS — BP 196/96 | HR 69 | Temp 98.2°F | Resp 16 | Ht 65.0 in | Wt 138.4 lb

## 2020-09-29 DIAGNOSIS — F419 Anxiety disorder, unspecified: Secondary | ICD-10-CM

## 2020-09-29 DIAGNOSIS — E1165 Type 2 diabetes mellitus with hyperglycemia: Secondary | ICD-10-CM | POA: Diagnosis not present

## 2020-09-29 DIAGNOSIS — N1832 Chronic kidney disease, stage 3b: Secondary | ICD-10-CM | POA: Diagnosis not present

## 2020-09-29 DIAGNOSIS — D638 Anemia in other chronic diseases classified elsewhere: Secondary | ICD-10-CM | POA: Diagnosis not present

## 2020-09-29 DIAGNOSIS — F32A Depression, unspecified: Secondary | ICD-10-CM

## 2020-09-29 DIAGNOSIS — I10 Essential (primary) hypertension: Secondary | ICD-10-CM | POA: Diagnosis not present

## 2020-09-29 DIAGNOSIS — R0989 Other specified symptoms and signs involving the circulatory and respiratory systems: Secondary | ICD-10-CM | POA: Diagnosis not present

## 2020-09-29 MED ORDER — CITALOPRAM HYDROBROMIDE 10 MG PO TABS
10.0000 mg | ORAL_TABLET | Freq: Every day | ORAL | 3 refills | Status: DC
Start: 2020-09-29 — End: 2020-10-06

## 2020-09-29 NOTE — Telephone Encounter (Signed)
Spoke to pt daughter--ok per DPR. Informed her the sleep study has been authorized and gave her the PIN: [id number]. Pt daughter verbalized thanks and stated they will get it done either tonight or one night this week.

## 2020-09-29 NOTE — Progress Notes (Signed)
Subjective:  Patient ID: Chelsea [name], female    DOB: September 01, 1945  Age: 75 y.o. MRN: [id number]  CC:  Chief Complaint  Patient presents with   Establish Care    Pt establish care, pt would like to see new provider due to feeling she wasn't properly cared for last office,    Hypertension    Pt has high BP today Adult cuff Lt arm showed 220/88 small adult cuff Lt arm showed 188/90 notes urology have been doing medication but has been out of balance for some time pt does note some anxiety today     HPI Chelsea [name] presents for   Not good experience with last provider. Wanted new office.   New patient to establish primary care.  Here with her daughter Koren Bound who is her primary caregiver.  Previous primary care provider [facility], Pablo Lawrence, NP. Note reviewed from June 24, hospital follow-up at that time for community-acquired pneumonia and urinary tract infection.    Cardiac: Cardiologist, Dr. Harrell Gave.  Appointment September 2 with Laurann Montana, NP.  History of cardiac arrest, carotid artery occlusion status post left carotid endarterectomy, hypertension, history of CVA 10/2019.  Prior uncontrolled hypertension.  Previously on carvedilol, then increase to twice per day, started on Lasix then as needed.  Rosuvastatin started due to intolerance with atorvastatin.  Hydralazine increased to 50 mg 3 times daily in July, and sleep study was ordered due to snoring and reported apneic episodes.  Nephrologist has switched her lisinopril to lisinopril HCTZ and caution with as needed Lasix. Blood pressure running higher lately. Called nephrologist today - plan to follow up with me first and cardiology. BP had been improved on lisinopril hctz - 130-140/70-80.  Increasing paver past week or two - 165/88, 184/95, 198/94, 201/96, 205/86. 151/69.  No HA, new focal weakness, no CP/dyspnea, no facial droop or change in speech.  No use of furosemide in past  month. Some leg swelling past month or two.  Feel more weak with morning pills - BP still 170-180.  No missed meds, no change in meds since 9/2.  No change to salt in diet. Occasional added salt. No recent frozen/fast food, take out or restaurant food.  No recent hydralazine - some confusion whether that was supposed to be for emergency use.  Has not had sleep study yet.   BP Readings from Last 3 Encounters:  09/29/20 (!) 196/96  09/12/20 140/66  07/16/20 (!) 157/64    Chronic kidney disease, stage IIIb by problem list. Nephrologist, Dr. [name], [facility]. Also with anemia of chronic disease.  eGFR 32 on 07/21/2020. HTN  meds. adjusted by nephrologist.   Lab Results  Component Value Date   CREATININE 1.66 (H) 07/21/2020   Lab Results  Component Value Date   WBC 6.9 07/21/2020   HGB 9.6 (L) 07/21/2020   HCT 30.4 (L) 07/21/2020   MCV 95 07/21/2020   PLT 325 07/21/2020   History of recurrent UTI Followed by urology, Dr. Milford Cage. treated with tamsulosin.has cut back on there frequency of UTI, no recent infection.   Diabetes: Previously on metformin, discontinued at previous hospitalization, last A1c controlled on June 15 at 5.9.   Home readings have been elevated - 290 last week - gave 1 metformin.  Other times 130-140.   Lab Results  Component Value Date   HGBA1C 5.9 (H) 06/25/2020   HGBA1C 10.1 (H) 11/07/2019   Lab Results  Component Value Date   LDLCALC  121 (H) 11/08/2019   CREATININE 1.66 (H) 07/21/2020   Anxiety: Nervous breakdown in 2003, took Klonopin that worked well. 2 other  Meds caused her hives. Anxiety all day sometimes, sometimes 1-2 times per week.  No daily meds.  No SI, no HI.  Has not met with counselor - refuses to see therapist at this time.  Depression screen PHQ 2/9 09/29/2020  Decreased Interest 2  Down, Depressed, Hopeless 3  PHQ - 2 Score 5  Altered sleeping 2  Tired, decreased energy 3  Change in appetite 0  Feeling bad or failure  about yourself  2  Trouble concentrating 2  Moving slowly or fidgety/restless 1  Suicidal thoughts 0  PHQ-9 Score 15   GAD 7 : Generalized Anxiety Score 09/29/2020  Nervous, Anxious, on Edge 1  Control/stop worrying 2  Worry too much - different things 2  Trouble relaxing 1  Restless 0  Easily annoyed or irritable 1  Afraid - awful might happen 1  Total GAD 7 Score 8      History Patient Active Problem List   Diagnosis Date Noted   History of CVA (cerebrovascular accident) 07/10/2020   Pure hypercholesterolemia 07/10/2020   Type 2 diabetes mellitus with complication, without long-term current use of insulin ([place]) 07/10/2020   Malnutrition of moderate degree 06/27/2020   Stage 3b chronic kidney disease ([place]) 06/25/2020   CAP (community acquired pneumonia) 06/25/2020   Stenosis of left carotid artery 12/03/2019   Stroke ([place])    Pain    Acute ischemic right posterior cerebral artery (PCA) stroke ([place]) 11/09/2019   Essential hypertension    Controlled type 2 diabetes mellitus with hyperglycemia (HCC)    Postherpetic neuralgia    Uncontrolled type 2 diabetes mellitus with hyperglycemia, without long-term current use of insulin ([place]) 11/07/2019   Hypertensive urgency 11/07/2019   Stenosis of left internal carotid artery 11/07/2019   Intractable nausea and vomiting 11/07/2019   Nicotine dependence, cigarettes, uncomplicated 16/10/9602   HZV (herpes zoster virus) post herpetic neuralgia 11/07/2019   Weakness of right lower extremity 11/07/2019   CVA (cerebral vascular accident) ([place]) 11/07/2019   Past Medical History:  Diagnosis Date   Allergy    Anemia    Blind left eye    Cardiac arrest ([place])    Carotid artery occlusion    Cataract    Depression    Diabetes mellitus without complication ([place])    Hyperlipidemia    Hypertension    Oxygen deficiency    Stroke [facility])    Past Surgical History:  Procedure Laterality Date   ENDARTERECTOMY Left 12/03/2019   Procedure:  LEFT CAROTID ENDARTERECTOMY;  Surgeon: Angelia Mould, MD;  Location: [facility];  Service: Vascular;  Laterality: Left;   PATCH ANGIOPLASTY Left 12/03/2019   Procedure: PATCH ANGIOPLASTY Left Carotid Artery;  Surgeon: Angelia Mould, MD;  Location: [facility] OR;  Service: Vascular;  Laterality: Left;   Allergies  Allergen Reactions   Codeine Anaphylaxis and Swelling    Throat swells     Penicillins Anaphylaxis and Swelling    Throat closes     Sulfa Antibiotics Anaphylaxis, Swelling and Other (See Comments)    Lips and eye swell    Benzodiazepines Hives   Lidocaine Hives   Oxycodone-Acetaminophen Other (See Comments)    unknown     Trazodone Other (See Comments)    unknown     Cephalexin Other (See Comments)    Unknown. Tolerating rocephin 06/25/20   Gabapentin Nausea And Vomiting  Lipitor [Atorvastatin]     Vomiting    Lyrica [Pregabalin]     Vomiting    Prior to Admission medications   Medication Sig Start Date End Date Taking? Authorizing Provider  amLODipine (NORVASC) 10 MG tablet Take 1 tablet (10 mg total) by mouth daily. 09/12/20  Yes Loel Dubonnet, NP  carvedilol (COREG) 3.125 MG tablet Take 1 tablet (3.125 mg total) by mouth 2 (two) times daily with a meal. 06/18/20  Yes Buford Dresser, MD  clopidogrel (PLAVIX) 75 MG tablet Take 1 tablet (75 mg total) by mouth daily. 12/05/19  Yes Love, Ivan Anchors, PA-C  lisinopril-hydrochlorothiazide (ZESTORETIC) 20-25 MG tablet Take 1 tablet by mouth daily. 09/01/20  Yes [provider]  meclizine (ANTIVERT) 25 MG tablet Take 25 mg by mouth 3 (three) times daily as needed for dizziness.   Yes [provider]  metFORMIN (GLUCOPHAGE) 500 MG tablet Take 500 mg by mouth as needed.   Yes [provider]  Probiotic Product (PROBIOTIC PO) Take by mouth as directed.   Yes [provider]  rosuvastatin (CRESTOR) 5 MG tablet Take 5 mg every other day for 2 weeks, then increase to 5 mg  daily. Patient taking differently: Take 5 mg by mouth See admin instructions. Take 5 mg every other day for 2 weeks, then increase to 5 mg daily. 06/18/20  Yes Buford Dresser, MD  tamsulosin (FLOMAX) 0.4 MG CAPS capsule Take 0.4 mg by mouth daily. 07/07/20  Yes [provider]  acetaminophen (TYLENOL) 500 MG tablet Take 1,000 mg by mouth every 6 (six) hours as needed for moderate pain. Patient not taking: Reported on 09/29/2020    [provider]  Bisacodyl (LAXATIVE PO) Take by mouth as directed. Patient not taking: Reported on 09/29/2020    [provider]  Elastic Bandages & Supports (MEDICAL COMPRESSION SOCKS) MISC 1 Units by Does not apply route daily. Patient not taking: Reported on 09/29/2020 09/12/20   Loel Dubonnet, NP  Ferrous Sulfate (IRON) 325 (65 Fe) MG TABS Take by mouth as directed. Patient not taking: Reported on 09/29/2020    [provider]  furosemide (LASIX) 40 MG tablet Take 1 tablet (40 mg total) by mouth as needed (swelling). Patient taking differently: Take 40 mg by mouth daily as needed for edema. 06/18/20 09/16/20  Buford Dresser, MD  guaiFENesin (MUCINEX) 600 MG 12 hr tablet Take 600 mg by mouth 2 (two) times daily. Patient not taking: Reported on 09/29/2020    [provider]  hydrALAZINE (APRESOLINE) 50 MG tablet Take 1 tablet (50 mg total) by mouth 3 (three) times daily. Patient not taking: Reported on 09/29/2020 09/12/20 03/11/21  Loel Dubonnet, NP   Social History   Socioeconomic History   Marital status: Divorced    Spouse name: Not on file   Number of children: Not on file   Years of education: Not on file   Highest education level: Not on file  Occupational History   Not on file  Tobacco Use   Smoking status: Former    Types: Cigarettes   Smokeless tobacco: Never  Vaping Use   Vaping Use: Never used  Substance and Sexual Activity   Alcohol use: Not Currently   Drug use: Never   Sexual activity:  Not Currently  Other Topics Concern   Not on file  Social History Narrative   Lives with daughter, Gabriel Cirri   Right Handed   Drinks no caffeine   Social Determinants of Health  Financial Resource Strain: Not on file  Food Insecurity: Not on file  Transportation Needs: Not on file  Physical Activity: Not on file  Stress: Not on file  Social Connections: Not on file  Intimate Partner Violence: Not on file    Review of Systems Per HPI.   Objective:   Vitals:   09/29/20 1514  BP: (!) 188/90  Pulse: 69  Resp: 16  Temp: 98.2 F (36.8 C)  TempSrc: Temporal  SpO2: 96%  Weight: 138 lb 6.4 oz (62.8 kg)  Height: 5' 5"  (1.651 m)     Physical Exam Vitals reviewed.  Constitutional:      Appearance: Normal appearance. She is well-developed.  HENT:     Head: Normocephalic and atraumatic.  Eyes:     Conjunctiva/sclera: Conjunctivae normal.     Pupils: Pupils are equal, round, and reactive to light.  Neck:     Vascular: No carotid bruit.  Cardiovascular:     Rate and Rhythm: Normal rate and regular rhythm.     Heart sounds: Normal heart sounds.  Pulmonary:     Effort: Pulmonary effort is normal.     Breath sounds: Normal breath sounds.     Comments: Coarse breath sounds diffusely, no distress, normal effort.   Abdominal:     Palpations: Abdomen is soft. There is no pulsatile mass.     Tenderness: There is no abdominal tenderness.  Musculoskeletal:     Right lower leg: No edema.     Left lower leg: No edema.  Skin:    General: Skin is warm and dry.  Neurological:     Mental Status: She is alert and oriented to person, place, and time.  Psychiatric:        Mood and Affect: Mood normal.        Behavior: Behavior normal.    9mnutes spent during visit, including chart review, counseling and assimilation of information, exam, discussion of plan, and chart completion.   No results found.   Assessment & Plan:  Chelsea KOSTELECKYis a 75y.o. female . Uncontrolled  hypertension - Plan: DG Chest 2 View  -Uncontrolled with previous history of CVA, asymptomatic at present.  Appears she should be on hydralazine based on last cardiology note.  We will have her restart low-dose at 25 mg 3 times daily, continue other medications, and asked that she clarify this plan with her nephrologist.  Additionally may need to adjust the timing of her medications and asked that she also discuss this with her cardiologist, possibly moving amlodipine to evening dosing.  ER precautions given if symptomatic, home monitoring discussed with handout given.  Anxiety and depression - Plan: citalopram (CELEXA) 10 MG tablet  -Longstanding anxiety, previous use of Klonopin but I am concerned with using that medication given her other health history, some generalized fatigue in the morning and risk of falls.  We will start low-dose Celexa with potential side effects discussed.  Recheck next week.  Declined counseling.  Uncontrolled type 2 diabetes mellitus with hyperglycemia, without long-term current use of insulin ([place] - Plan: Hemoglobin A1c  -Previously uncontrolled but most recent A1c was low, now off metformin.  1 isolated elevated reading.  Check A1c to decide on restart.  Anemia, chronic disease - Plan: CBC  -Check updated CBC.  Continue nephrology follow-up.  Stage 3b chronic kidney disease ([place] - Plan: Comprehensive metabolic panel  -Followed by nephrology, check updated renal function and electrolytes, med changes as above for hypertension.  Abnormal lung  sounds - Plan: DG Chest 2 View  -Coarse breath sounds versus rales, check chest x-ray given prior pneumonia and elevated blood pressure, rule out infiltrate, pulmonary edema.  ER precautions given.  Meds ordered this encounter  Medications   citalopram (CELEXA) 10 MG tablet    Sig: Take 1 tablet (10 mg total) by mouth daily.    Dispense:  30 tablet    Refill:  3   Patient Instructions  Take a look at diet and see if any  sodium in typical diet. See handout on typical foods. Try restarting hydralazine 15m three times per day. Check her blood pressure 2 times per day or if any new symptoms. Make sure cardiology and nephrology are ok with restarting this med. No other changes for now. Recheck with me in 1 week.  Return to the clinic or go to the nearest emergency room if any of your symptoms worsen or new symptoms occur  See info on managing anxiety below. I do recommend considering meeting with therapist. Let me know if your thoughts change. We can try starting celexa once per day - that may take a few weeks to work.   Recheck in 1 week.  How to Take Your Blood Pressure Blood pressure is a measurement of how strongly your blood is pressing against the walls of your arteries. Arteries are blood vessels that carry blood from your heart throughout your body. Your health care provider takes your blood pressure at each office visit. You can also take your own blood pressure at home with a blood pressure monitor. You may need to take your own blood pressure to: Confirm a diagnosis of high blood pressure (hypertension). Monitor your blood pressure over time. Make sure your blood pressure medicine is working. Supplies needed: Blood pressure monitor. Dining room chair to sit in. Table or desk. Small notebook and pencil or pen. How to prepare To get the most accurate reading, avoid the following for 30 minutes before you check your blood pressure: Drinking caffeine. Drinking alcohol. Eating. Smoking. Exercising. Five minutes before you check your blood pressure: Use the bathroom and urinate so that you have an empty bladder. Sit quietly in a dining room chair. Do not sit in a soft couch or an armchair. Do not talk. How to take your blood pressure To check your blood pressure, follow the instructions in the manual that came with your blood pressure monitor. If you have a digital blood pressure monitor, the  instructions may be as follows: Sit up straight in a chair. Place your feet on the floor. Do not cross your ankles or legs. Rest your left arm at the level of your heart on a table or desk or on the arm of a chair. Pull up your shirt sleeve. Wrap the blood pressure cuff around the upper part of your left arm, 1 inch (2.5 cm) above your elbow. It is best to wrap the cuff around bare skin. Fit the cuff snugly around your arm. You should be able to place only one finger between the cuff and your arm. Position the cord so that it rests in the bend of your elbow. Press the power button. Sit quietly while the cuff inflates and deflates. Read the digital reading on the monitor screen and write the numbers down (record them) in a notebook. Wait 2-3 minutes, then repeat the steps, starting at step 1. What does my blood pressure reading mean? A blood pressure reading consists of a higher number over a lower  number. Ideally, your blood pressure should be below 120/80. The first ("top") number is called the systolic pressure. It is a measure of the pressure in your arteries as your heart beats. The second ("bottom") number is called the diastolic pressure. It is a measure of the pressure in your arteries as the heart relaxes. Blood pressure is classified into five stages. The following are the stages for adults who do not have a short-term serious illness or a chronic condition. Systolic pressure and diastolic pressure are measured in a unit called mm Hg (millimeters of mercury).  Normal Systolic pressure: below 035. Diastolic pressure: below 80. Elevated Systolic pressure: 009-381. Diastolic pressure: below 80. Hypertension stage 1 Systolic pressure: 829-937. Diastolic pressure: 16-96. Hypertension stage 2 Systolic pressure: 789 or above. Diastolic pressure: 90 or above. You can have elevated blood pressure or hypertension even if only the systolic or only the diastolic number in your reading is  higher than normal. Follow these instructions at home: Medicines Take over-the-counter and prescription medicines only as told by your health care provider. Tell your health care provider if you are having any side effects from blood pressure medicine. General instructions Check your blood pressure as often as recommended by your health care provider. Check your blood pressure at the same time every day. Take your monitor to the next appointment with your health care provider to make sure that: You are using it correctly. It provides accurate readings. Understand what your goal blood pressure numbers are. Keep all follow-up visits as told by your health care provider. This is important. General tips Your health care provider can suggest a reliable monitor that will meet your needs. There are several types of home blood pressure monitors. Choose a monitor that has an arm cuff. Do not choose a monitor that measures your blood pressure from your wrist or finger. Choose a cuff that wraps snugly around your upper arm. You should be able to fit only one finger between your arm and the cuff. You can buy a blood pressure monitor at most drugstores or online. Where to find more information American Heart Association: www.heart.org Contact a health care provider if: Your blood pressure is consistently high. Your blood pressure is suddenly low. Get help right away if: Your systolic blood pressure is higher than 180. Your diastolic blood pressure is higher than 120. Summary Blood pressure is a measurement of how strongly your blood is pressing against the walls of your arteries. A blood pressure reading consists of a higher number over a lower number. Ideally, your blood pressure should be below 120/80. Check your blood pressure at the same time every day. Avoid caffeine, alcohol, smoking, and exercise for 30 minutes prior to checking your blood pressure. These agents can affect the accuracy of the  blood pressure reading. This information is not intended to replace advice given to you by your health care provider. Make sure you discuss any questions you have with your health care provider. Document Revised: 11/07/2019 Document Reviewed: 12/22/2018 Elsevier Patient Education  2022 [place], Adult After being diagnosed with an anxiety disorder, you may be relieved to know why you have felt or behaved a certain way. You may also feel overwhelmed about the treatment ahead and what it will mean for your life. With care and support, you can manage this condition and recover from it. How to manage lifestyle changes Managing stress and anxiety Stress is your body's reaction to life changes and events, both good  and bad. Most stress will last just a few hours, but stress can be ongoing and can lead to more than just stress. Although stress can play a major role in anxiety, it is not the same as anxiety. Stress is usually caused by something external, such as a deadline, test, or competition. Stress normally passes after the triggering event has ended.  Anxiety is caused by something internal, such as imagining a terrible outcome or worrying that something will go wrong that will devastate you. Anxiety often does not go away even after the triggering event is over, and it can become long-term (chronic) worry. It is important to understand the differences between stress and anxiety and to manage your stress effectively so that it does not lead to an anxious response. Talk with your health care provider or a counselor to learn more about reducing anxiety and stress. He or she may suggest tension reduction techniques, such as: Music therapy. This can include creating or listening to music that you enjoy and that inspires you. Mindfulness-based meditation. This involves being aware of your normal breaths while not trying to control your breathing. It can be done while sitting or  walking. Centering prayer. This involves focusing on a word, phrase, or sacred image that means something to you and brings you peace. Deep breathing. To do this, expand your stomach and inhale slowly through your nose. Hold your breath for 3-5 seconds. Then exhale slowly, letting your stomach muscles relax. Self-talk. This involves identifying thought patterns that lead to anxiety reactions and changing those patterns. Muscle relaxation. This involves tensing muscles and then relaxing them. Choose a tension reduction technique that suits your lifestyle and personality. These techniques take time and practice. Set aside 5-15 minutes a day to do them. Therapists can offer counseling and training in these techniques. The training to help with anxiety may be covered by some insurance plans. Other things you can do to manage stress and anxiety include: Keeping a stress/anxiety diary. This can help you learn what triggers your reaction and then learn ways to manage your response. Thinking about how you react to certain situations. You may not be able to control everything, but you can control your response. Making time for activities that help you relax and not feeling guilty about spending your time in this way. Visual imagery and yoga can help you stay calm and relax.  Medicines Medicines can help ease symptoms. Medicines for anxiety include: Anti-anxiety drugs. Antidepressants. Medicines are often used as a primary treatment for anxiety disorder. Medicines will be prescribed by a health care provider. When used together, medicines, psychotherapy, and tension reduction techniques may be the most effective treatment. Relationships Relationships can play a big part in helping you recover. Try to spend more time connecting with trusted friends and family members. Consider going to couples counseling, taking family education classes, or going to family therapy. Therapy can help you and others better  understand your condition. How to recognize changes in your anxiety Everyone responds differently to treatment for anxiety. Recovery from anxiety happens when symptoms decrease and stop interfering with your daily activities at home or work. This may mean that you will start to: Have better concentration and focus. Worry will interfere less in your daily thinking. Sleep better. Be less irritable. Have more energy. Have improved memory. It is important to recognize when your condition is getting worse. Contact your health care provider if your symptoms interfere with home or work and you feel like your condition  is not improving. Follow these instructions at home: Activity Exercise. Most adults should do the following: Exercise for at least 150 minutes each week. The exercise should increase your heart rate and make you sweat (moderate-intensity exercise). Strengthening exercises at least twice a week. Get the right amount and quality of sleep. Most adults need 7-9 hours of sleep each night. Lifestyle  Eat a healthy diet that includes plenty of vegetables, fruits, whole grains, low-fat dairy products, and lean protein. Do not eat a lot of foods that are high in solid fats, added sugars, or salt. Make choices that simplify your life. Do not use any products that contain nicotine or tobacco, such as cigarettes, e-cigarettes, and chewing tobacco. If you need help quitting, ask your health care provider. Avoid caffeine, alcohol, and certain over-the-counter cold medicines. These may make you feel worse. Ask your pharmacist which medicines to avoid. General instructions Take over-the-counter and prescription medicines only as told by your health care provider. Keep all follow-up visits as told by your health care provider. This is important. Where to find support You can get help and support from these sources: Self-help groups. Online and [employer]. A trusted spiritual  leader. Couples counseling. Family education classes. Family therapy. Where to find more information You may find that joining a support group helps you deal with your anxiety. The following sources can help you locate counselors or support groups near you: [place]: www.mentalhealthamerica.net Anxiety and Depression Association of [place] (ADAA): https://www.clark.net/ National Alliance on Mental Illness (NAMI): www.nami.org Contact a health care provider if you: Have a hard time staying focused or finishing daily tasks. Spend many hours a day feeling worried about everyday life. Become exhausted by worry. Start to have headaches, feel tense, or have nausea. Urinate more than normal. Have diarrhea. Get help right away if you have: A racing heart and shortness of breath. Thoughts of hurting yourself or others. If you ever feel like you may hurt yourself or others, or have thoughts about taking your own life, get help right away. You can go to your nearest emergency department or call: Your local emergency services (911 in the [place] A suicide crisis helpline, such as the [facility] at (718)660-2286. This is open 24 hours a day. Summary Taking steps to learn and use tension reduction techniques can help calm you and help prevent triggering an anxiety reaction. When used together, medicines, psychotherapy, and tension reduction techniques may be the most effective treatment. Family, friends, and partners can play a big part in helping you recover from an anxiety disorder. This information is not intended to replace advice given to you by your health care provider. Make sure you discuss any questions you have with your health care provider. Document Revised: 05/30/2018 Document Reviewed: 05/30/2018 Elsevier Patient Education  2022 [employer],   Merri Ray, MD [place] [place]  [facility] 09/29/20 3:49 PM

## 2020-09-29 NOTE — Patient Instructions (Addendum)
Take a look at diet and see if any sodium in typical diet. See handout on typical foods. Try restarting hydralazine 25mg  three times per day. Check her blood pressure 2 times per day or if any new symptoms. Make sure cardiology and nephrology are ok with restarting this med. No other changes for now. Recheck with me in 1 week.  Return to the clinic or go to the nearest emergency room if any of your symptoms worsen or new symptoms occur  See info on managing anxiety below. I do recommend considering meeting with therapist. Let me know if your thoughts change. We can try starting celexa once per day - that may take a few weeks to work.   Recheck in 1 week.  How to Take Your Blood Pressure Blood pressure is a measurement of how strongly your blood is pressing against the walls of your arteries. Arteries are blood vessels that carry blood from your heart throughout your body. Your health care provider takes your blood pressure at each office visit. You can also take your own blood pressure at home with a blood pressure monitor. You may need to take your own blood pressure to: Confirm a diagnosis of high blood pressure (hypertension). Monitor your blood pressure over time. Make sure your blood pressure medicine is working. Supplies needed: Blood pressure monitor. Dining room chair to sit in. Table or desk. Small notebook and pencil or pen. How to prepare To get the most accurate reading, avoid the following for 30 minutes before you check your blood pressure: Drinking caffeine. Drinking alcohol. Eating. Smoking. Exercising. Five minutes before you check your blood pressure: Use the bathroom and urinate so that you have an empty bladder. Sit quietly in a dining room chair. Do not sit in a soft couch or an armchair. Do not talk. How to take your blood pressure To check your blood pressure, follow the instructions in the manual that came with your blood pressure monitor. If you have a digital  blood pressure monitor, the instructions may be as follows: Sit up straight in a chair. Place your feet on the floor. Do not cross your ankles or legs. Rest your left arm at the level of your heart on a table or desk or on the arm of a chair. Pull up your shirt sleeve. Wrap the blood pressure cuff around the upper part of your left arm, 1 inch (2.5 cm) above your elbow. It is best to wrap the cuff around bare skin. Fit the cuff snugly around your arm. You should be able to place only one finger between the cuff and your arm. Position the cord so that it rests in the bend of your elbow. Press the power button. Sit quietly while the cuff inflates and deflates. Read the digital reading on the monitor screen and write the numbers down (record them) in a notebook. Wait 2-3 minutes, then repeat the steps, starting at step 1. What does my blood pressure reading mean? A blood pressure reading consists of a higher number over a lower number. Ideally, your blood pressure should be below 120/80. The first ("top") number is called the systolic pressure. It is a measure of the pressure in your arteries as your heart beats. The second ("bottom") number is called the diastolic pressure. It is a measure of the pressure in your arteries as the heart relaxes. Blood pressure is classified into five stages. The following are the stages for adults who do not have a short-term serious illness or  a chronic condition. Systolic pressure and diastolic pressure are measured in a unit called mm Hg (millimeters of mercury).  Normal Systolic pressure: below 120. Diastolic pressure: below 80. Elevated Systolic pressure: 120-129. Diastolic pressure: below 80. Hypertension stage 1 Systolic pressure: 130-139. Diastolic pressure: 80-89. Hypertension stage 2 Systolic pressure: 140 or above. Diastolic pressure: 90 or above. You can have elevated blood pressure or hypertension even if only the systolic or only the diastolic  number in your reading is higher than normal. Follow these instructions at home: Medicines Take over-the-counter and prescription medicines only as told by your health care provider. Tell your health care provider if you are having any side effects from blood pressure medicine. General instructions Check your blood pressure as often as recommended by your health care provider. Check your blood pressure at the same time every day. Take your monitor to the next appointment with your health care provider to make sure that: You are using it correctly. It provides accurate readings. Understand what your goal blood pressure numbers are. Keep all follow-up visits as told by your health care provider. This is important. General tips Your health care provider can suggest a reliable monitor that will meet your needs. There are several types of home blood pressure monitors. Choose a monitor that has an arm cuff. Do not choose a monitor that measures your blood pressure from your wrist or finger. Choose a cuff that wraps snugly around your upper arm. You should be able to fit only one finger between your arm and the cuff. You can buy a blood pressure monitor at most drugstores or online. Where to find more information American Heart Association: www.heart.org Contact a health care provider if: Your blood pressure is consistently high. Your blood pressure is suddenly low. Get help right away if: Your systolic blood pressure is higher than 180. Your diastolic blood pressure is higher than 120. Summary Blood pressure is a measurement of how strongly your blood is pressing against the walls of your arteries. A blood pressure reading consists of a higher number over a lower number. Ideally, your blood pressure should be below 120/80. Check your blood pressure at the same time every day. Avoid caffeine, alcohol, smoking, and exercise for 30 minutes prior to checking your blood pressure. These agents can  affect the accuracy of the blood pressure reading. This information is not intended to replace advice given to you by your health care provider. Make sure you discuss any questions you have with your health care provider. Document Revised: 11/07/2019 Document Reviewed: 12/22/2018 Elsevier Patient Education  2022 Elsevier Inc.   Managing Anxiety, Adult After being diagnosed with an anxiety disorder, you may be relieved to know why you have felt or behaved a certain way. You may also feel overwhelmed about the treatment ahead and what it will mean for your life. With care and support, you can manage this condition and recover from it. How to manage lifestyle changes Managing stress and anxiety Stress is your body's reaction to life changes and events, both good and bad. Most stress will last just a few hours, but stress can be ongoing and can lead to more than just stress. Although stress can play a major role in anxiety, it is not the same as anxiety. Stress is usually caused by something external, such as a deadline, test, or competition. Stress normally passes after the triggering event has ended.  Anxiety is caused by something internal, such as imagining a terrible outcome or worrying that  something will go wrong that will devastate you. Anxiety often does not go away even after the triggering event is over, and it can become long-term (chronic) worry. It is important to understand the differences between stress and anxiety and to manage your stress effectively so that it does not lead to an anxious response. Talk with your health care provider or a counselor to learn more about reducing anxiety and stress. He or she may suggest tension reduction techniques, such as: Music therapy. This can include creating or listening to music that you enjoy and that inspires you. Mindfulness-based meditation. This involves being aware of your normal breaths while not trying to control your breathing. It can be  done while sitting or walking. Centering prayer. This involves focusing on a word, phrase, or sacred image that means something to you and brings you peace. Deep breathing. To do this, expand your stomach and inhale slowly through your nose. Hold your breath for 3-5 seconds. Then exhale slowly, letting your stomach muscles relax. Self-talk. This involves identifying thought patterns that lead to anxiety reactions and changing those patterns. Muscle relaxation. This involves tensing muscles and then relaxing them. Choose a tension reduction technique that suits your lifestyle and personality. These techniques take time and practice. Set aside 5-15 minutes a day to do them. Therapists can offer counseling and training in these techniques. The training to help with anxiety may be covered by some insurance plans. Other things you can do to manage stress and anxiety include: Keeping a stress/anxiety diary. This can help you learn what triggers your reaction and then learn ways to manage your response. Thinking about how you react to certain situations. You may not be able to control everything, but you can control your response. Making time for activities that help you relax and not feeling guilty about spending your time in this way. Visual imagery and yoga can help you stay calm and relax.  Medicines Medicines can help ease symptoms. Medicines for anxiety include: Anti-anxiety drugs. Antidepressants. Medicines are often used as a primary treatment for anxiety disorder. Medicines will be prescribed by a health care provider. When used together, medicines, psychotherapy, and tension reduction techniques may be the most effective treatment. Relationships Relationships can play a big part in helping you recover. Try to spend more time connecting with trusted friends and family members. Consider going to couples counseling, taking family education classes, or going to family therapy. Therapy can help you  and others better understand your condition. How to recognize changes in your anxiety Everyone responds differently to treatment for anxiety. Recovery from anxiety happens when symptoms decrease and stop interfering with your daily activities at home or work. This may mean that you will start to: Have better concentration and focus. Worry will interfere less in your daily thinking. Sleep better. Be less irritable. Have more energy. Have improved memory. It is important to recognize when your condition is getting worse. Contact your health care provider if your symptoms interfere with home or work and you feel like your condition is not improving. Follow these instructions at home: Activity Exercise. Most adults should do the following: Exercise for at least 150 minutes each week. The exercise should increase your heart rate and make you sweat (moderate-intensity exercise). Strengthening exercises at least twice a week. Get the right amount and quality of sleep. Most adults need 7-9 hours of sleep each night. Lifestyle  Eat a healthy diet that includes plenty of vegetables, fruits, whole grains, low-fat dairy products,  and lean protein. Do not eat a lot of foods that are high in solid fats, added sugars, or salt. Make choices that simplify your life. Do not use any products that contain nicotine or tobacco, such as cigarettes, e-cigarettes, and chewing tobacco. If you need help quitting, ask your health care provider. Avoid caffeine, alcohol, and certain over-the-counter cold medicines. These may make you feel worse. Ask your pharmacist which medicines to avoid. General instructions Take over-the-counter and prescription medicines only as told by your health care provider. Keep all follow-up visits as told by your health care provider. This is important. Where to find support You can get help and support from these sources: Self-help groups. Online and [employer]. A trusted  spiritual leader. Couples counseling. Family education classes. Family therapy. Where to find more information You may find that joining a support group helps you deal with your anxiety. The following sources can help you locate counselors or support groups near you: Mental Health America: www.mentalhealthamerica.net Anxiety and Depression Association of [place] (ADAA): [employer] on Mental Illness (NAMI): www.nami.org Contact a health care provider if you: Have a hard time staying focused or finishing daily tasks. Spend many hours a day feeling worried about everyday life. Become exhausted by worry. Start to have headaches, feel tense, or have nausea. Urinate more than normal. Have diarrhea. Get help right away if you have: A racing heart and shortness of breath. Thoughts of hurting yourself or others. If you ever feel like you may hurt yourself or others, or have thoughts about taking your own life, get help right away. You can go to your nearest emergency department or call: Your local emergency services (911 in the [place] A suicide crisis helpline, such as the National Suicide Prevention Lifeline at 709-778-7183. This is open 24 hours a day. Summary Taking steps to learn and use tension reduction techniques can help calm you and help prevent triggering an anxiety reaction. When used together, medicines, psychotherapy, and tension reduction techniques may be the most effective treatment. Family, friends, and partners can play a big part in helping you recover from an anxiety disorder. This information is not intended to replace advice given to you by your health care provider. Make sure you discuss any questions you have with your health care provider. Document Revised: 05/30/2018 Document Reviewed: 05/30/2018 Elsevier Patient Education  2022 [employer].

## 2020-09-30 ENCOUNTER — Ambulatory Visit (INDEPENDENT_AMBULATORY_CARE_PROVIDER_SITE_OTHER)
Admission: RE | Admit: 2020-09-30 | Discharge: 2020-09-30 | Disposition: A | Discharge status: Home / Self Care | Payer: PPO | Source: Ambulatory Visit | Attending: Family Medicine | Admitting: Family Medicine

## 2020-09-30 ENCOUNTER — Telehealth: Payer: Self-pay | Admitting: Cardiology

## 2020-09-30 ENCOUNTER — Encounter: Payer: Self-pay | Admitting: Family Medicine

## 2020-09-30 DIAGNOSIS — I10 Essential (primary) hypertension: Secondary | ICD-10-CM | POA: Diagnosis not present

## 2020-09-30 DIAGNOSIS — R0989 Other specified symptoms and signs involving the circulatory and respiratory systems: Secondary | ICD-10-CM

## 2020-09-30 LAB — COMPREHENSIVE METABOLIC PANEL
ALT: 8 U/L (ref 0–35)
AST: 14 U/L (ref 0–37)
Albumin: 4.3 g/dL (ref 3.5–5.2)
Alkaline Phosphatase: 100 U/L (ref 39–117)
BUN: 27 mg/dL — ABNORMAL HIGH (ref 6–23)
CO2: 28 mEq/L (ref 19–32)
Calcium: 9.6 mg/dL (ref 8.4–10.5)
Chloride: 95 mEq/L — ABNORMAL LOW (ref 96–112)
Creatinine, Ser: 1.43 mg/dL — ABNORMAL HIGH (ref 0.40–1.20)
GFR: 35.65 mL/min — ABNORMAL LOW (ref >60.00)
Glucose, Bld: 92 mg/dL (ref 70–99)
Potassium: 4.1 mEq/L (ref 3.5–5.1)
Sodium: 131 mEq/L — ABNORMAL LOW (ref 135–145)
Total Bilirubin: 0.5 mg/dL (ref 0.2–1.2)
Total Protein: 7.1 g/dL (ref 6.0–8.3)

## 2020-09-30 LAB — CBC
HCT: 32.2 % — ABNORMAL LOW (ref 36.0–46.0)
Hemoglobin: 11.1 g/dL — ABNORMAL LOW (ref 12.0–15.0)
MCHC: 34.3 g/dL (ref 30.0–36.0)
MCV: 91.2 fl (ref 78.0–100.0)
Platelets: 363 10*3/uL (ref 150.0–400.0)
RBC: 3.53 Mil/uL — ABNORMAL LOW (ref 3.87–5.11)
RDW: 13.4 % (ref 11.5–15.5)
WBC: 8.9 10*3/uL (ref 4.0–10.5)

## 2020-09-30 LAB — HEMOGLOBIN A1C: Hgb A1c MFr Bld: 6.4 % (ref 4.6–6.5)

## 2020-09-30 NOTE — Telephone Encounter (Signed)
Spoke with patient's daughter (Dr. Cristal Deer)  She has been having high BP for about 2 weeks  Patient went to Dr. Thomasene Lot office yesterday for HTN, who advised she resume hydralazine, which daughter said was discontinued in July by Dr. Valentino Nose (nephrology) - was not taken off our med list   Explained that hydralazine is still on med list and that at 9/2 visit, Luther Parody mentions increasing dose of this medication but may not have been aware that she was off the med since July, prior to this visit.   Will send to MD/NP

## 2020-09-30 NOTE — Telephone Encounter (Signed)
BP during most recent clinic visit 140/66. At that time per my understanding she was taking Hydralazine 50mg  TID.   PCP yesterday recommended resuming at 25mg  TID with careful monitoring. Agree with recommendation. Please ensure taking approximately 7-8 hours apart (I.e. 8am - 3pm - 10pm). Encourage home monitoring at least 2 hours after medications (10-11am would be good time to check).   If BP persistently >140/80 on Friday, recommend she call our office to update. At that time we would plan to further up titrate Hydralazine. Hydralazine is safe in setting of renal function.   She has follow up with Dr. 10/06/20. Would benefit from follow up with Dr. Neva Seat or myself in 4-6 weeks to reassess BP.  10/08/20

## 2020-09-30 NOTE — Telephone Encounter (Signed)
Attempted to contact daughter. Unable to leave VM as mailbox is not set up.

## 2020-09-30 NOTE — Telephone Encounter (Signed)
Pt c/o BP issue: STAT if pt c/o blurred vision, one-sided weakness or slurred speech  1. What are your last 5 BP readings? 224/99; 196/96; 193/94  2. Are you having any other symptoms (ex. Dizziness, headache, blurred vision, passed out)? No   3. What is your BP issue? Very high BP; went to primary care yesterday due to BP

## 2020-10-01 ENCOUNTER — Encounter (INDEPENDENT_AMBULATORY_CARE_PROVIDER_SITE_OTHER): Payer: PPO | Admitting: Cardiology

## 2020-10-01 ENCOUNTER — Telehealth: Payer: Self-pay

## 2020-10-01 DIAGNOSIS — J189 Pneumonia, unspecified organism: Secondary | ICD-10-CM | POA: Diagnosis not present

## 2020-10-01 DIAGNOSIS — G4733 Obstructive sleep apnea (adult) (pediatric): Secondary | ICD-10-CM

## 2020-10-01 NOTE — Telephone Encounter (Signed)
I returned patient call and informed her that as soon as pcp adds notes to the xray result, I would call her with the notes and that they will also  be visible on mychart. Patient understood. No further concerns at this time.

## 2020-10-01 NOTE — Telephone Encounter (Signed)
Result note entered

## 2020-10-01 NOTE — Telephone Encounter (Signed)
Daughter called back saying no one called explain that we can but her VM was full. Patient bp this morning was 192/93 please advise

## 2020-10-01 NOTE — Telephone Encounter (Signed)
Daughter states that pt BP is still high and she states that pt has not been taking her Hydralazine 25mg  TID since July. She states that she was of the understanding that at PCP he had her start the lisinopril/HCTZ in July and pt was to stop the hydralazine. BP was been ok until a couple days ago when BP has been 224/99; 196/96; 193/94. She will have pt restart hydralazine 25mg  TID (explained schedule and she will take BP at least 1 hour after) and she will let August know if pt BP does not go down.

## 2020-10-01 NOTE — Telephone Encounter (Signed)
Patient called regarding her chest x-ray  Please return call to patient regarding this when it has been viewed

## 2020-10-02 NOTE — Telephone Encounter (Signed)
Returned patient call about her Xray results. Patient read them on mychart and do not have any concerns at this time.

## 2020-10-04 DIAGNOSIS — I63531 Cerebral infarction due to unspecified occlusion or stenosis of right posterior cerebral artery: Secondary | ICD-10-CM | POA: Diagnosis not present

## 2020-10-05 ENCOUNTER — Inpatient Hospital Stay: Payer: PPO

## 2020-10-05 DIAGNOSIS — R0683 Snoring: Secondary | ICD-10-CM

## 2020-10-05 DIAGNOSIS — G4730 Sleep apnea, unspecified: Secondary | ICD-10-CM

## 2020-10-05 NOTE — Procedures (Signed)
   Sleep Study Report  Patient Information Study Date: 10/01/20 Patient Name: Chelsea [name] Patient ID: [id number] Birth Date: 2045-10-24 Age: 75 Gender: Female BMI: 23.1 (W=139 lb, H=5' 5'') Neck Circ.: 13 '' Referring Physician:Caitlin Walker, NP  TEST DESCRIPTION: Home sleep apnea testing was completed using the WatchPat, a Type 1 device, utilizing peripheral arterial tonometry (PAT), chest movement, actigraphy, pulse oximetry, pulse rate, body position and snore. AHI was calculated with apnea and hypopnea using valid sleep time as the denominator. RDI includes apneas, hypopneas, and RERAs. The data acquired and the scoring of sleep and all associated events were performed in accordance with the recommended standards and specifications as outlined in the AASM Manual for the Scoring of Sleep and Associated Events 2.2.0 (2015).  FINDINGS: 1. No evidence of Obstructive Sleep Apnea with AHI 4.3/hr. 2. No Central Sleep Apnea. 3. Oxygen desaturations as low as 80%. 4. Minimal snoring was present. O2 sats were < 88% for 0.6 minutes. 5. Total sleep time was 5 hrs and 23 min. 6. 17.8% of total sleep time was spent in REM sleep. 7. Normal sleep onset latency at 16 min. 8. Shortened REM sleep onset latency at 66 min. 9. Total awakenings were 6 .  DIAGNOSIS: Normal study with no significant sleep disordered breathing.  RECOMMENDATIONS: 1. Normal study with no significant sleep disordered breathing.  2. Healthy sleep recommendations include: adequate nightly sleep (normal 7-9 hrs/night), avoidance of caffeine after noon and alcohol near bedtime, and maintaining a sleep environment that is cool, dark and quiet.  3. Weight loss for overweight patients is recommended.  4. Snoring recommendations include: weight loss where appropriate, side sleeping, and avoidance of alcohol before bed.  5. Operation of motor vehicle or dangerous equipment must be avoided when feeling drowsy, excessively  sleepy, or mentally fatigued.  6. An ENT consultation which may be useful for specific causes of and possible treatment of bothersome snoring .  7. Weight loss may be of benefit in reducing the severity of snoring.   Signature: Electronically Signed: 10/05/20 Armanda Magic, MD; [facility]; Diplomat, American Board of Sleep Medicine Report prepared by: Armanda Magic

## 2020-10-06 ENCOUNTER — Emergency Department (HOSPITAL_BASED_OUTPATIENT_CLINIC_OR_DEPARTMENT_OTHER): Payer: PPO

## 2020-10-06 ENCOUNTER — Other Ambulatory Visit: Payer: Self-pay

## 2020-10-06 ENCOUNTER — Ambulatory Visit (INDEPENDENT_AMBULATORY_CARE_PROVIDER_SITE_OTHER): Payer: PPO | Admitting: Family Medicine

## 2020-10-06 ENCOUNTER — Inpatient Hospital Stay (HOSPITAL_BASED_OUTPATIENT_CLINIC_OR_DEPARTMENT_OTHER)
Admission: EM | Admit: 2020-10-06 | Discharge: 2020-10-10 | DRG: 312 | Disposition: A | Discharge status: Home Health | Payer: PPO | Attending: Internal Medicine | Admitting: Internal Medicine

## 2020-10-06 ENCOUNTER — Emergency Department (HOSPITAL_BASED_OUTPATIENT_CLINIC_OR_DEPARTMENT_OTHER): Payer: PPO | Admitting: Radiology

## 2020-10-06 ENCOUNTER — Telehealth: Payer: Self-pay | Admitting: *Deleted

## 2020-10-06 ENCOUNTER — Telehealth: Payer: Self-pay | Admitting: Cardiology

## 2020-10-06 ENCOUNTER — Encounter (HOSPITAL_BASED_OUTPATIENT_CLINIC_OR_DEPARTMENT_OTHER): Payer: Self-pay | Admitting: *Deleted

## 2020-10-06 ENCOUNTER — Encounter: Payer: Self-pay | Admitting: Family Medicine

## 2020-10-06 VITALS — BP 162/88 | HR 62 | Temp 97.6°F | Ht 65.0 in | Wt 139.0 lb

## 2020-10-06 DIAGNOSIS — Z79899 Other long term (current) drug therapy: Secondary | ICD-10-CM

## 2020-10-06 DIAGNOSIS — R627 Adult failure to thrive: Secondary | ICD-10-CM | POA: Diagnosis not present

## 2020-10-06 DIAGNOSIS — D631 Anemia in chronic kidney disease: Secondary | ICD-10-CM | POA: Diagnosis present

## 2020-10-06 DIAGNOSIS — I129 Hypertensive chronic kidney disease with stage 1 through stage 4 chronic kidney disease, or unspecified chronic kidney disease: Secondary | ICD-10-CM | POA: Diagnosis not present

## 2020-10-06 DIAGNOSIS — Z8674 Personal history of sudden cardiac arrest: Secondary | ICD-10-CM

## 2020-10-06 DIAGNOSIS — N179 Acute kidney failure, unspecified: Secondary | ICD-10-CM | POA: Diagnosis not present

## 2020-10-06 DIAGNOSIS — E785 Hyperlipidemia, unspecified: Secondary | ICD-10-CM | POA: Diagnosis not present

## 2020-10-06 DIAGNOSIS — Z888 Allergy status to other drugs, medicaments and biological substances status: Secondary | ICD-10-CM

## 2020-10-06 DIAGNOSIS — N760 Acute vaginitis: Secondary | ICD-10-CM | POA: Diagnosis present

## 2020-10-06 DIAGNOSIS — E860 Dehydration: Secondary | ICD-10-CM | POA: Diagnosis not present

## 2020-10-06 DIAGNOSIS — R350 Frequency of micturition: Secondary | ICD-10-CM

## 2020-10-06 DIAGNOSIS — N319 Neuromuscular dysfunction of bladder, unspecified: Secondary | ICD-10-CM | POA: Diagnosis not present

## 2020-10-06 DIAGNOSIS — H5462 Unqualified visual loss, left eye, normal vision right eye: Secondary | ICD-10-CM | POA: Diagnosis present

## 2020-10-06 DIAGNOSIS — N189 Chronic kidney disease, unspecified: Secondary | ICD-10-CM | POA: Diagnosis not present

## 2020-10-06 DIAGNOSIS — N390 Urinary tract infection, site not specified: Secondary | ICD-10-CM | POA: Diagnosis present

## 2020-10-06 DIAGNOSIS — E871 Hypo-osmolality and hyponatremia: Secondary | ICD-10-CM

## 2020-10-06 DIAGNOSIS — R55 Syncope and collapse: Principal | ICD-10-CM | POA: Diagnosis present

## 2020-10-06 DIAGNOSIS — Z8249 Family history of ischemic heart disease and other diseases of the circulatory system: Secondary | ICD-10-CM

## 2020-10-06 DIAGNOSIS — Z20822 Contact with and (suspected) exposure to covid-19: Secondary | ICD-10-CM | POA: Diagnosis not present

## 2020-10-06 DIAGNOSIS — I959 Hypotension, unspecified: Secondary | ICD-10-CM | POA: Diagnosis not present

## 2020-10-06 DIAGNOSIS — Z823 Family history of stroke: Secondary | ICD-10-CM | POA: Diagnosis not present

## 2020-10-06 DIAGNOSIS — Z87440 Personal history of urinary (tract) infections: Secondary | ICD-10-CM

## 2020-10-06 DIAGNOSIS — G4733 Obstructive sleep apnea (adult) (pediatric): Secondary | ICD-10-CM

## 2020-10-06 DIAGNOSIS — Z882 Allergy status to sulfonamides status: Secondary | ICD-10-CM

## 2020-10-06 DIAGNOSIS — Z87891 Personal history of nicotine dependence: Secondary | ICD-10-CM

## 2020-10-06 DIAGNOSIS — I69351 Hemiplegia and hemiparesis following cerebral infarction affecting right dominant side: Secondary | ICD-10-CM

## 2020-10-06 DIAGNOSIS — F419 Anxiety disorder, unspecified: Secondary | ICD-10-CM

## 2020-10-06 DIAGNOSIS — N1832 Chronic kidney disease, stage 3b: Secondary | ICD-10-CM

## 2020-10-06 DIAGNOSIS — I517 Cardiomegaly: Secondary | ICD-10-CM | POA: Diagnosis not present

## 2020-10-06 DIAGNOSIS — M25551 Pain in right hip: Secondary | ICD-10-CM | POA: Diagnosis not present

## 2020-10-06 DIAGNOSIS — R001 Bradycardia, unspecified: Secondary | ICD-10-CM | POA: Diagnosis not present

## 2020-10-06 DIAGNOSIS — R0989 Other specified symptoms and signs involving the circulatory and respiratory systems: Secondary | ICD-10-CM | POA: Diagnosis not present

## 2020-10-06 DIAGNOSIS — F32A Depression, unspecified: Secondary | ICD-10-CM

## 2020-10-06 DIAGNOSIS — R000 Tachycardia, unspecified: Secondary | ICD-10-CM | POA: Diagnosis not present

## 2020-10-06 DIAGNOSIS — E118 Type 2 diabetes mellitus with unspecified complications: Secondary | ICD-10-CM | POA: Diagnosis present

## 2020-10-06 DIAGNOSIS — R011 Cardiac murmur, unspecified: Secondary | ICD-10-CM | POA: Diagnosis not present

## 2020-10-06 DIAGNOSIS — R0600 Dyspnea, unspecified: Secondary | ICD-10-CM | POA: Diagnosis not present

## 2020-10-06 DIAGNOSIS — E1122 Type 2 diabetes mellitus with diabetic chronic kidney disease: Secondary | ICD-10-CM | POA: Diagnosis present

## 2020-10-06 DIAGNOSIS — Z885 Allergy status to narcotic agent status: Secondary | ICD-10-CM

## 2020-10-06 DIAGNOSIS — Z880 Allergy status to penicillin: Secondary | ICD-10-CM

## 2020-10-06 DIAGNOSIS — R61 Generalized hyperhidrosis: Secondary | ICD-10-CM | POA: Diagnosis not present

## 2020-10-06 DIAGNOSIS — I10 Essential (primary) hypertension: Secondary | ICD-10-CM | POA: Diagnosis present

## 2020-10-06 DIAGNOSIS — Z881 Allergy status to other antibiotic agents status: Secondary | ICD-10-CM

## 2020-10-06 DIAGNOSIS — Z8673 Personal history of transient ischemic attack (TIA), and cerebral infarction without residual deficits: Secondary | ICD-10-CM

## 2020-10-06 LAB — CBC WITH DIFFERENTIAL/PLATELET
Abs Immature Granulocytes: 0.07 10*3/uL (ref 0.00–0.07)
Basophils Absolute: 0 10*3/uL (ref 0.0–0.1)
Basophils Relative: 0 %
Eosinophils Absolute: 0.4 10*3/uL (ref 0.0–0.5)
Eosinophils Relative: 4 %
HCT: 30.4 % — ABNORMAL LOW (ref 36.0–46.0)
Hemoglobin: 10.6 g/dL — ABNORMAL LOW (ref 12.0–15.0)
Immature Granulocytes: 1 %
Lymphocytes Relative: 12 %
Lymphs Abs: 1.2 10*3/uL (ref 0.7–4.0)
MCH: 30.9 pg (ref 26.0–34.0)
MCHC: 34.9 g/dL (ref 30.0–36.0)
MCV: 88.6 fL (ref 80.0–100.0)
Monocytes Absolute: 0.6 10*3/uL (ref 0.1–1.0)
Monocytes Relative: 6 %
Neutro Abs: 8.1 10*3/uL — ABNORMAL HIGH (ref 1.7–7.7)
Neutrophils Relative %: 77 %
Platelets: 329 10*3/uL (ref 150–400)
RBC: 3.43 MIL/uL — ABNORMAL LOW (ref 3.87–5.11)
RDW: 12.8 % (ref 11.5–15.5)
WBC: 10.4 10*3/uL (ref 4.0–10.5)
nRBC: 0 % (ref 0.0–0.2)

## 2020-10-06 LAB — BASIC METABOLIC PANEL
Anion gap: 9 (ref 5–15)
Anion gap: 8 (ref 5–15)
BUN: 39 mg/dL — ABNORMAL HIGH (ref 6–23)
BUN: 44 mg/dL — ABNORMAL HIGH (ref 8–23)
BUN: 44 mg/dL — ABNORMAL HIGH (ref 8–23)
CO2: 26 mEq/L (ref 19–32)
CO2: 26 mmol/L (ref 22–32)
CO2: 24 mmol/L (ref 22–32)
Calcium: 9.7 mg/dL (ref 8.4–10.5)
Calcium: 9.8 mg/dL (ref 8.9–10.3)
Calcium: 9.2 mg/dL (ref 8.9–10.3)
Chloride: 90 mEq/L — ABNORMAL LOW (ref 96–112)
Chloride: 90 mmol/L — ABNORMAL LOW (ref 98–111)
Chloride: 93 mmol/L — ABNORMAL LOW (ref 98–111)
Creatinine, Ser: 1.87 mg/dL — ABNORMAL HIGH (ref 0.40–1.20)
Creatinine, Ser: 2.17 mg/dL — ABNORMAL HIGH (ref 0.44–1.00)
Creatinine, Ser: 2.13 mg/dL — ABNORMAL HIGH (ref 0.44–1.00)
GFR, Estimated: 23 mL/min — ABNORMAL LOW (ref >60)
GFR, Estimated: 24 mL/min — ABNORMAL LOW (ref >60)
GFR: 25.84 mL/min — ABNORMAL LOW (ref >60.00)
Glucose, Bld: 94 mg/dL (ref 70–99)
Glucose, Bld: 267 mg/dL — ABNORMAL HIGH (ref 70–99)
Glucose, Bld: 215 mg/dL — ABNORMAL HIGH (ref 70–99)
Potassium: 4.5 mEq/L (ref 3.5–5.1)
Potassium: 4.6 mmol/L (ref 3.5–5.1)
Potassium: 4.8 mmol/L (ref 3.5–5.1)
Sodium: 124 mEq/L — ABNORMAL LOW (ref 135–145)
Sodium: 125 mmol/L — ABNORMAL LOW (ref 135–145)
Sodium: 125 mmol/L — ABNORMAL LOW (ref 135–145)

## 2020-10-06 LAB — POCT URINALYSIS DIP (MANUAL ENTRY)
Bilirubin, UA: NEGATIVE
Glucose, UA: NEGATIVE mg/dL
Ketones, POC UA: NEGATIVE mg/dL
Nitrite, UA: NEGATIVE
Protein Ur, POC: 30 mg/dL — AB
Spec Grav, UA: <=1.005 — AB (ref 1.010–1.025)
Urobilinogen, UA: NEGATIVE E.U./dL — AB
pH, UA: 6 (ref 5.0–8.0)

## 2020-10-06 LAB — TROPONIN I (HIGH SENSITIVITY)
Troponin I (High Sensitivity): 3 ng/L (ref <18)
Troponin I (High Sensitivity): 4 ng/L (ref <18)

## 2020-10-06 LAB — RESP PANEL BY RT-PCR (FLU A&B, COVID) ARPGX2
Influenza A by PCR: NEGATIVE
Influenza B by PCR: NEGATIVE
SARS Coronavirus 2 by RT PCR: NEGATIVE

## 2020-10-06 MED ORDER — SODIUM CHLORIDE 0.9 % IV SOLN: INTRAVENOUS | Status: DC

## 2020-10-06 MED ORDER — LACTATED RINGERS IV BOLUS
500.0000 mL | Freq: Once | INTRAVENOUS | Status: AC
  Administered 2020-10-06: 500 mL via INTRAVENOUS

## 2020-10-06 MED ORDER — ACETAMINOPHEN 325 MG PO TABS
650.0000 mg | ORAL_TABLET | Freq: Once | ORAL | Status: AC
  Administered 2020-10-06: 650 mg via ORAL
  Filled 2020-10-06: qty 2

## 2020-10-06 MED ORDER — SODIUM CHLORIDE 0.9 % IV SOLN: Freq: Once | INTRAVENOUS | Status: AC

## 2020-10-06 NOTE — ED Notes (Signed)
Patient is resting comfortably, visitor @ bedside. 

## 2020-10-06 NOTE — Telephone Encounter (Signed)
A user error has taken place: encounter opened in error, closed for administrative reasons.

## 2020-10-06 NOTE — Telephone Encounter (Addendum)
The patient has been notified of the result and verbalized understanding.  All questions (if any) were answered.  Patient is currently in the ER. Her daughter will call our office back when she is able to do the NPSG. Latrelle Dodrill, CMA 10/06/2020 11:47 AM    Will precert

## 2020-10-06 NOTE — Progress Notes (Signed)
Pt has arrived on unit from [facility].

## 2020-10-06 NOTE — Telephone Encounter (Signed)
Unable to leave message voicemail is full.

## 2020-10-06 NOTE — Progress Notes (Signed)
Subjective:  Patient ID: Chelsea [name], female    DOB: 1946/01/03  Age: 75 y.o. MRN: [id number]  CC:  Chief Complaint  Patient presents with   Follow-up    Pt daughter--still elevated Bp , and wants to discuss UTI    HPI Chelsea [name] presents for   Hypertension: New patient to establish care with me on September 19, uncontrolled hypertension at that time.  Followed by cardiology, nephrology.  Restarted hydralazine as I did not see where that had been recommended to be discontinued.  Initially low-dose of 25 mg 3 times daily. Here with dtr.  Still with high home readings at times. Heart doctor recommended checking BP 1 hour after hydralazine.  Some variability on home readings - up to 189/85 , but down to 119/62, 153/73, 123/62, 142/66, 181/88. Feels tired with taking hydralazine.    BP Readings from Last 3 Encounters:  10/06/20 (!) 162/88  09/29/20 (!) 196/96  09/12/20 140/66   Lab Results  Component Value Date   CREATININE 1.43 (H) 09/29/2020   History of recurrent UTI Followed by urology, has taken tamsulosin which is cut back on the frequency of UTI and had denied recent infection at our last visit. Bad odor with UTI in past. No recent odor.  No fever, no abd pain, no change in urination. Some discharge last week -resolved.   Anxiety/depression See last visit, longstanding anxiety.  Deferred use of benzodiazepine/Klonopin at last visit given her other health history, fatigue and risk of falls.  Started on Celexa 10 mg daily. Has no started celexa yet. Wanted to wait for the other meds first.  Denies suicidal ideation.  Declines therapist.   Depression screen PHQ 2/9 09/29/2020  Decreased Interest 2  Down, Depressed, Hopeless 3  PHQ - 2 Score 5  Altered sleeping 2  Tired, decreased energy 3  Change in appetite 0  Feeling bad or failure about yourself  2  Trouble concentrating 2  Moving slowly or fidgety/restless 1  Suicidal thoughts 0  PHQ-9 Score 15      Hyponatremia Sodium 131 on 9/19 visit.    Other labs from last visit: renal function stable with creatinine 1.43, down from 1.66.  Hemoglobin improved from 9.6-11.1.  Hemoglobin A1c had increased from 5.9-6.4, off metformin. Cut back on sodas.   History Patient Active Problem List   Diagnosis Date Noted   History of CVA (cerebrovascular accident) 07/10/2020   Pure hypercholesterolemia 07/10/2020   Type 2 diabetes mellitus with complication, without long-term current use of insulin (HCC) 07/10/2020   Malnutrition of moderate degree 06/27/2020   Stage 3b chronic kidney disease (HCC) 06/25/2020   CAP (community acquired pneumonia) 06/25/2020   Stenosis of left carotid artery 12/03/2019   Stroke [facility])    Pain    Acute ischemic right posterior cerebral artery (PCA) stroke (HCC) 11/09/2019   Essential hypertension    Controlled type 2 diabetes mellitus with hyperglycemia (HCC)    Postherpetic neuralgia    Uncontrolled type 2 diabetes mellitus with hyperglycemia, without long-term current use of insulin (HCC) 11/07/2019   Hypertensive urgency 11/07/2019   Stenosis of left internal carotid artery 11/07/2019   Intractable nausea and vomiting 11/07/2019   Nicotine dependence, cigarettes, uncomplicated 11/07/2019   HZV (herpes zoster virus) post herpetic neuralgia 11/07/2019   Weakness of right lower extremity 11/07/2019   CVA (cerebral vascular accident) (HCC) 11/07/2019   Past Medical History:  Diagnosis Date   Allergy    Anemia    Blind  left eye    Cardiac arrest [facility])    Carotid artery occlusion    Cataract    Depression    Diabetes mellitus without complication (HCC)    Hyperlipidemia    Hypertension    Oxygen deficiency    Stroke [facility])    Past Surgical History:  Procedure Laterality Date   ENDARTERECTOMY Left 12/03/2019   Procedure: LEFT CAROTID ENDARTERECTOMY;  Surgeon: Chuck Hint, MD;  Location: [facility] OR;  Service: Vascular;  Laterality: Left;   PATCH  ANGIOPLASTY Left 12/03/2019   Procedure: PATCH ANGIOPLASTY Left Carotid Artery;  Surgeon: Chuck Hint, MD;  Location: [facility] OR;  Service: Vascular;  Laterality: Left;   Allergies  Allergen Reactions   Codeine Anaphylaxis and Swelling    Throat swells     Penicillins Anaphylaxis and Swelling    Throat closes     Sulfa Antibiotics Anaphylaxis, Swelling and Other (See Comments)    Lips and eye swell    Benzodiazepines Hives   Lidocaine Hives   Oxycodone-Acetaminophen Other (See Comments)    unknown     Trazodone Other (See Comments)    unknown     Cephalexin Other (See Comments)    Unknown. Tolerating rocephin 06/25/20   Gabapentin Nausea And Vomiting   Lipitor [Atorvastatin]     Vomiting    Lyrica [Pregabalin]     Vomiting    Prior to Admission medications   Medication Sig Start Date End Date Taking? Authorizing Provider  acetaminophen (TYLENOL) 500 MG tablet Take 1,000 mg by mouth every 6 (six) hours as needed for moderate pain.   Yes [provider]  amLODipine (NORVASC) 10 MG tablet Take 1 tablet (10 mg total) by mouth daily. 09/12/20  Yes Alver Sorrow, NP  carvedilol (COREG) 3.125 MG tablet Take 1 tablet (3.125 mg total) by mouth 2 (two) times daily with a meal. 06/18/20  Yes Jodelle Red, MD  clopidogrel (PLAVIX) 75 MG tablet Take 1 tablet (75 mg total) by mouth daily. 12/05/19  Yes Love, Evlyn Kanner, PA-C  Elastic Bandages & Supports (MEDICAL COMPRESSION SOCKS) MISC 1 Units by Does not apply route daily. 09/12/20  Yes Alver Sorrow, NP  Ferrous Sulfate (IRON) 325 (65 Fe) MG TABS Take by mouth as directed.   Yes [provider]  guaiFENesin (MUCINEX) 600 MG 12 hr tablet Take 600 mg by mouth 2 (two) times daily.   Yes [provider]  hydrALAZINE (APRESOLINE) 50 MG tablet Take 1 tablet (50 mg total) by mouth 3 (three) times daily. 09/12/20 03/11/21 Yes Alver Sorrow, NP  lisinopril-hydrochlorothiazide (ZESTORETIC) 20-25 MG  tablet Take 1 tablet by mouth daily. 09/01/20  Yes [provider]  meclizine (ANTIVERT) 25 MG tablet Take 25 mg by mouth 3 (three) times daily as needed for dizziness.   Yes [provider]  metFORMIN (GLUCOPHAGE) 500 MG tablet Take 500 mg by mouth as needed.   Yes [provider]  rosuvastatin (CRESTOR) 5 MG tablet Take 5 mg every other day for 2 weeks, then increase to 5 mg daily. Patient taking differently: Take 5 mg by mouth See admin instructions. Take 5 mg every other day for 2 weeks, then increase to 5 mg daily. 06/18/20  Yes Jodelle Red, MD  tamsulosin (FLOMAX) 0.4 MG CAPS capsule Take 0.4 mg by mouth daily. 07/07/20  Yes [provider]  Bisacodyl (LAXATIVE PO) Take by mouth as directed. Patient not taking: No sig reported    [provider]  citalopram (CELEXA) 10 MG tablet Take 1 tablet (10 mg total) by mouth daily. Patient not taking: Reported on 10/06/2020 09/29/20   Shade Flood, MD  furosemide (LASIX) 40 MG tablet Take 1 tablet (40 mg total) by mouth as needed (swelling). Patient taking differently: Take 40 mg by mouth daily as needed for edema. 06/18/20 09/16/20  Jodelle Red, MD  Probiotic Product (PROBIOTIC PO) Take by mouth as directed. Patient not taking: Reported on 10/06/2020    [provider]   Social History   Socioeconomic History   Marital status: Divorced    Spouse name: Not on file   Number of children: Not on file   Years of education: Not on file   Highest education level: Not on file  Occupational History   Not on file  Tobacco Use   Smoking status: Former    Types: Cigarettes   Smokeless tobacco: Never  Vaping Use   Vaping Use: Never used  Substance and Sexual Activity   Alcohol use: Not Currently   Drug use: Never   Sexual activity: Not Currently  Other Topics Concern   Not on file  Social History Narrative   Lives with daughter, [name]   Right Handed   Drinks no caffeine    Social Determinants of Health   Financial Resource Strain: Not on file  Food Insecurity: Not on file  Transportation Needs: Not on file  Physical Activity: Not on file  Stress: Not on file  Social Connections: Not on file  Intimate Partner Violence: Not on file    Review of Systems No chest pain, dyspnea, HA or weakness.   Objective:   Vitals:   10/06/20 1201 10/06/20 1256  BP: (!) 182/94 (!) 162/88  Pulse: 62   Temp: 97.6 F (36.4 C)   SpO2: 97%   Weight: 139 lb (63 kg)   Height: 5\' 5"  (1.651 m)     Physical Exam Vitals reviewed.  Constitutional:      Appearance: Normal appearance. She is well-developed.  HENT:     Head: Normocephalic and atraumatic.  Eyes:     Conjunctiva/sclera: Conjunctivae normal.     Pupils: Pupils are equal, round, and reactive to light.  Neck:     Vascular: No carotid bruit.  Cardiovascular:     Rate and Rhythm: Normal rate and regular rhythm.     Heart sounds: Normal heart sounds.  Pulmonary:     Effort: Pulmonary effort is normal.     Breath sounds: Normal breath sounds.  Abdominal:     Palpations: Abdomen is soft. There is no pulsatile mass.     Tenderness: There is no abdominal tenderness.  Musculoskeletal:     Right lower leg: No edema.     Left lower leg: No edema.  Skin:    General: Skin is warm and dry.  Neurological:     Mental Status: She is alert and oriented to person, place, and time.  Psychiatric:        Mood and Affect: Mood normal.        Behavior: Behavior normal.   38 minutes spent during visit, including chart review, counseling and assimilation of information, exam, discussion of plan, and chart completion.    Assessment & Plan:  RYLINN LINZY is a 75 y.o. female . Labile hypertension  -Few isolated high readings but has had some lower readings as well as above.  Does experience some fatigue after taking hydralazine and her other meds.  I am hesitant  to increase medications, but also not decrease given  previous elevated readings and CVA.  Asked that she follow-up with cardiology to decide if changing and timing of medications or further changes.  Daughter plans to call for an appointment but I can place referral if needed.  Frequent urination - Plan: POCT urinalysis dipstick, Urine Culture History of recurrent UTIs - Plan: Urine Culture  -Denies any new urinary symptoms.  Denies fever, abdominal pain, chills, back pain, and no recent odor that has been attributed to previous urinary tract infection.  Without symptoms I am hesitant to start antibiotic based on in office urinalysis as differential includes asymptomatic bacteriuria.  Will check urine culture, but strict RTC/ER precautions given if any new signs or symptoms of UTI as experienced prior.   Hyponatremia - Plan: Basic metabolic panel  -Slightly low sodium on last labs, repeat testing.  Other labs were stable/improving.  Anxiety and depression  -Discussed starting Celexa as prescribed last visit when she is ready.    Stage 3b chronic kidney disease (HCC) - Plan: Basic metabolic panel  -Improved renal function last visit, continue follow-up with nephrology, repeat BMP for hyponatremia above.   No orders of the defined types were placed in this encounter.  Patient Instructions  I do see the variable blood pressure readings, but with some of the lower readings, I am hesitant to change meds at this point. I do recommend discussing these readings and plan with cardiology at a visit and they can look at when you are taking medicines and how they are being combined.  Return to the clinic or go to the nearest emergency room if any of your symptoms worsen or new symptoms occur.  I will check a urine culture, but without any new urinary symptoms I am hesitant to start an antibiotic right now, as possible asymptomatic bacteriuria.  Continue follow-up with urology, and if any new symptoms be seen right away as we discussed.  I will check your  blood work today and if there are concerns we will let you know.  Recheck in 1 month but please let me know if there are questions or if I can help sooner.  Take care!     Signed,   Meredith Staggers, MD [facility], [facility] 10/06/20 1:27 PM

## 2020-10-06 NOTE — ED Triage Notes (Signed)
Patient went to see her PCP today and her BP is high but does not want to change her BP medicine and family doctor wants her Cardiologist to change the medication.  Pt was sent home.    Patient and daughter was eating at 2:15 and pt rolled her self out of the dining table and suddenly broke out of sweats and passed out.  EMS came and checked her, no BP and can't hear her heart beat.  This information is obtained from patient's daughter, Chelsea [name].

## 2020-10-06 NOTE — ED Provider Notes (Signed)
MEDCENTER [facility] EMERGENCY DEPT Provider Note   CSN: [id number] Arrival date & time: 10/06/20  1554     History Chief Complaint  Patient presents with   Loss of Consciousness    Chelsea [name] is a 75 y.o. female.  She has a past medical history of type 2 diabetes, hypertension, left ICA and carotid artery stenosis, previous CVA, hyperlipidemia.  Patient presents today after a near syncopal episode.  She says that earlier today she started feeling generalized weakness.  She was sitting at the kitchen table when she started sweating and then she felt like she was in a pass out.  She states that she did not actually pass out.  But her daughter called EMS where apparently they could not get a blood pressure or find a pulse.  Patient was alert during this episode.  She also complains of associated Neck and shoulder stiffness pain.  Patient denies any chest pain, shortness of breath, abdominal pain, nausea, vomiting, diarrhea, change in bowel movements, headache, vision changes.  She also went to see her PCP today and had high blood pressure at that time.  Her family doctor did not want to change her blood pressure medication but would rather her cardiologist change it so she was sent home.   Patient also says that she has some right hip pain.  That started when she was put in the stretcher via EMS.  She has not ambulated since.   Loss of Consciousness Associated symptoms: diaphoresis and weakness   Associated symptoms: no chest pain, no dizziness, no fever, no headaches, no nausea, no palpitations, no shortness of breath and no vomiting       Past Medical History:  Diagnosis Date   Allergy    Anemia    Blind left eye    Cardiac arrest (HCC)    Carotid artery occlusion    Cataract    Depression    Diabetes mellitus without complication (HCC)    Hyperlipidemia    Hypertension    Oxygen deficiency    Stroke [facility])     Patient Active Problem List   Diagnosis Date Noted    Syncope 10/06/2020   History of CVA (cerebrovascular accident) 07/10/2020   Pure hypercholesterolemia 07/10/2020   Type 2 diabetes mellitus with complication, without long-term current use of insulin (HCC) 07/10/2020   Malnutrition of moderate degree 06/27/2020   Stage 3b chronic kidney disease (HCC) 06/25/2020   CAP (community acquired pneumonia) 06/25/2020   Stenosis of left carotid artery 12/03/2019   Stroke [facility])    Pain    Acute ischemic right posterior cerebral artery (PCA) stroke (HCC) 11/09/2019   Essential hypertension    Controlled type 2 diabetes mellitus with hyperglycemia (HCC)    Postherpetic neuralgia    Uncontrolled type 2 diabetes mellitus with hyperglycemia, without long-term current use of insulin (HCC) 11/07/2019   Hypertensive urgency 11/07/2019   Stenosis of left internal carotid artery 11/07/2019   Intractable nausea and vomiting 11/07/2019   Nicotine dependence, cigarettes, uncomplicated 11/07/2019   HZV (herpes zoster virus) post herpetic neuralgia 11/07/2019   Weakness of right lower extremity 11/07/2019   CVA (cerebral vascular accident) (HCC) 11/07/2019    Past Surgical History:  Procedure Laterality Date   ENDARTERECTOMY Left 12/03/2019   Procedure: LEFT CAROTID ENDARTERECTOMY;  Surgeon: Chuck Hint, MD;  Location: [facility] OR;  Service: Vascular;  Laterality: Left;   PATCH ANGIOPLASTY Left 12/03/2019   Procedure: PATCH ANGIOPLASTY Left Carotid Artery;  Surgeon: Chuck Hint,  MD;  Location: [facility] OR;  Service: Vascular;  Laterality: Left;     OB History     Gravida  2   Para  2   Term      Preterm      AB      Living         SAB      IAB      Ectopic      Multiple      Live Births              Family History  Problem Relation Age of Onset   Hypertension Mother    Hyperlipidemia Mother    Diabetes Mother    Cancer Mother    Arthritis Mother    Heart attack Mother    Hypertension Father    Hyperlipidemia  Father    Diabetes Father    Arthritis Father    Stroke Father    Hypertension Sister    Heart disease Sister    Heart attack Sister    Diabetes Sister    Depression Sister    COPD Sister    Arthritis Sister    Stroke Brother    Hypertension Brother    Heart disease Brother    Diabetes Brother    Depression Brother    Arthritis Brother    Stroke Daughter    Hypertension Daughter    Diabetes Daughter    Depression Daughter    Arthritis Daughter    Diabetes Son    Depression Son     Social History   Tobacco Use   Smoking status: Former    Types: Cigarettes   Smokeless tobacco: Never  Vaping Use   Vaping Use: Never used  Substance Use Topics   Alcohol use: Not Currently   Drug use: Never    Home Medications Prior to Admission medications   Medication Sig Start Date End Date Taking? Authorizing Provider  amLODipine (NORVASC) 10 MG tablet Take 1 tablet (10 mg total) by mouth daily. 09/12/20  Yes Alver Sorrow, NP  clopidogrel (PLAVIX) 75 MG tablet Take 1 tablet (75 mg total) by mouth daily. 12/05/19  Yes Love, Evlyn Kanner, PA-C  Ferrous Sulfate (IRON) 325 (65 Fe) MG TABS Take by mouth as directed.   Yes [provider]  rosuvastatin (CRESTOR) 5 MG tablet Take 5 mg every other day for 2 weeks, then increase to 5 mg daily. Patient taking differently: Take 5 mg by mouth See admin instructions. Take 5 mg every other day for 2 weeks, then increase to 5 mg daily. 06/18/20  Yes Jodelle Red, MD  tamsulosin (FLOMAX) 0.4 MG CAPS capsule Take 0.4 mg by mouth daily. 07/07/20  Yes [provider]  acetaminophen (TYLENOL) 500 MG tablet Take 1,000 mg by mouth every 6 (six) hours as needed for moderate pain.    [provider]  carvedilol (COREG) 3.125 MG tablet Take 1 tablet (3.125 mg total) by mouth 2 (two) times daily with a meal. 06/18/20   Jodelle Red, MD  Elastic Bandages & Supports (MEDICAL COMPRESSION SOCKS) MISC 1 Units by Does not  apply route daily. 09/12/20   Alver Sorrow, NP  hydrALAZINE (APRESOLINE) 50 MG tablet Take 1 tablet (50 mg total) by mouth 3 (three) times daily. Patient taking differently: Take 25 mg by mouth 3 (three) times daily. 09/12/20 03/11/21  Alver Sorrow, NP    Allergies    Codeine, Penicillins, Sulfa antibiotics, Benzodiazepines, Lidocaine, Oxycodone-acetaminophen, Trazodone, Cephalexin,  Gabapentin, Lipitor [atorvastatin], and Lyrica [pregabalin]  Review of Systems   Review of Systems  Constitutional:  Positive for diaphoresis. Negative for chills and fever.  HENT:  Negative for congestion and rhinorrhea.   Eyes:  Negative for visual disturbance.  Respiratory:  Negative for cough, chest tightness and shortness of breath.   Cardiovascular:  Positive for syncope. Negative for chest pain, palpitations and leg swelling.  Gastrointestinal:  Negative for abdominal pain, constipation, diarrhea, nausea and vomiting.  Genitourinary:  Negative for difficulty urinating.  Musculoskeletal:  Positive for neck stiffness. Negative for back pain.  Skin:  Negative for rash and wound.  Neurological:  Positive for syncope and weakness. Negative for dizziness, light-headedness and headaches.  All other systems reviewed and are negative.  Physical Exam Updated Vital Signs BP (!) 152/69 (BP Location: Right Arm)   Pulse 73   Temp 97.9 F (36.6 C)   Resp 14   Ht 5\' 5"  (1.651 m)   Wt 63 kg   SpO2 99%   BMI 23.13 kg/m   Physical Exam Vitals and nursing note reviewed.  Constitutional:      General: She is not in acute distress.    Appearance: Normal appearance. She is not ill-appearing, toxic-appearing or diaphoretic.  HENT:     Head: Normocephalic and atraumatic.     Mouth/Throat:     Mouth: Mucous membranes are moist.     Pharynx: Oropharynx is clear. No oropharyngeal exudate or posterior oropharyngeal erythema.  Eyes:     General: No scleral icterus.       Right eye: No discharge.        Left  eye: No discharge.     Conjunctiva/sclera: Conjunctivae normal.     Pupils: Pupils are equal, round, and reactive to light.  Cardiovascular:     Rate and Rhythm: Normal rate and regular rhythm.     Pulses: Normal pulses.     Heart sounds: Normal heart sounds, S1 normal and S2 normal. No murmur heard.   No friction rub. No gallop.  Pulmonary:     Effort: No respiratory distress.     Breath sounds: Normal breath sounds. No wheezing, rhonchi or rales.     Comments: Some dyspnea while sitting in bed. Abdominal:     General: Abdomen is flat. Bowel sounds are normal. There is no distension.     Palpations: Abdomen is soft. There is no pulsatile mass.     Tenderness: There is no abdominal tenderness. There is no guarding or rebound.  Musculoskeletal:     Right lower leg: No edema.     Left lower leg: No edema.  Skin:    General: Skin is warm and dry.     Coloration: Skin is not jaundiced.     Findings: No bruising, erythema, lesion or rash.  Neurological:     General: No focal deficit present.     Mental Status: She is alert and oriented to person, place, and time.  Psychiatric:        Mood and Affect: Mood normal.        Behavior: Behavior normal.    ED Results / Procedures / Treatments   Labs (all labs ordered are listed, but only abnormal results are displayed) Labs Reviewed  BASIC METABOLIC PANEL - Abnormal; Notable for the following components:      Result Value   Sodium 125 (*)    Chloride 90 (*)    Glucose, Bld 267 (*)    BUN 44 (*)  Creatinine, Ser 2.17 (*)    GFR, Estimated 23 (*)    All other components within normal limits  CBC WITH DIFFERENTIAL/PLATELET - Abnormal; Notable for the following components:   RBC 3.43 (*)    Hemoglobin 10.6 (*)    HCT 30.4 (*)    Neutro Abs 8.1 (*)    All other components within normal limits  BASIC METABOLIC PANEL - Abnormal; Notable for the following components:   Sodium 125 (*)    Chloride 93 (*)    Glucose, Bld 215 (*)     BUN 44 (*)    Creatinine, Ser 2.13 (*)    GFR, Estimated 24 (*)    All other components within normal limits  RESP PANEL BY RT-PCR (FLU A&B, COVID) ARPGX2  CBG MONITORING, ED  TROPONIN I (HIGH SENSITIVITY)  TROPONIN I (HIGH SENSITIVITY)    EKG EKG Interpretation  Date/Time:  Monday October 06 2020 16:59:11 EDT Ventricular Rate:  62 PR Interval:  188 QRS Duration: 106 QT Interval:  470 QTC Calculation: 477 R Axis:   -42 Text Interpretation: Normal sinus rhythm Left axis deviation Moderate voltage criteria for LVH, may be normal variant ( R in aVL , Cornell product ) Abnormal ECG Since last tracing t-wave inversions are resolved Confirmed by Susy Frizzle [phone]) on 10/06/2020 6:11:17 PM  Radiology DG Chest 2 View  Result Date: 10/06/2020 CLINICAL DATA:  Dyspnea EXAM: CHEST - 2 VIEW COMPARISON:  09/30/2020 FINDINGS: Scarring at the bases. No acute consolidation, pleural effusion or pneumothorax. Cardiomegaly IMPRESSION: No active cardiopulmonary disease. Cardiomegaly with stable scarring at the bases Electronically Signed   By: Jasmine Pang M.D.   On: 10/06/2020 19:27   DG Hip Unilat  With Pelvis 2-3 Views Right  Result Date: 10/06/2020 CLINICAL DATA:  Hip pain EXAM: DG HIP (WITH OR WITHOUT PELVIS) 2-3V RIGHT COMPARISON:  None. FINDINGS: SI joints are non widened. Pubic symphysis and rami appear intact. No fracture or malalignment. IMPRESSION: No acute osseous abnormality.  CT follow-up as clinically indicated Electronically Signed   By: Jasmine Pang M.D.   On: 10/06/2020 19:26    Procedures Procedures   Medications Ordered in ED Medications  0.9 %  sodium chloride infusion (has no administration in time range)  lactated ringers bolus 500 mL (0 mLs Intravenous Stopped 10/06/20 1919)  acetaminophen (TYLENOL) tablet 650 mg (650 mg Oral Given 10/06/20 1935)  0.9 %  sodium chloride infusion ( Intravenous New Bag/Given 10/06/20 1939)    ED Course  I have reviewed the triage  vital signs and the nursing notes.  Pertinent labs & imaging results that were available during my care of the patient were reviewed by me and considered in my medical decision making (see chart for details).  Clinical Course as of 10/06/20 2247  Mon Oct 06, 2020  2103 Spoke with Hospitalist Dr. Leafy Half who agrees to accept patient.  [GL]    Clinical Course User Index [GL] Makenzi Bannister, Finis Bud, PA-C   MDM Rules/Calculators/A&P                          This is an ill-appearing 75 year old female who presents the emergency department after a near syncopal episode along with complaints of generalized weakness throughout the day.  She is mildly hypertensive.  She is afebrile.  Vitals are hemodynamically stable.  Exam is overall unremarkable.  She does have some dyspnea at rest. Lungs CTA.   Reviewed all labs and imaging.  Creatinine is noted to be 2.13 which is increased from 1.87 six hours prior. Her baseline is noted to be 1.43 one week ago. She also has a hyponatremia of 125 that has been downtrending for the past week. Glucose is elevated to 215.  CBC with no leukocytosis or notable anemia that is different from baseline.  Her troponins are negative.  EKG with normal sinus rhythm.  Chest x-ray with evidence of cardiomegaly but no evidence of pulmonary edema.  We obtained an x-ray of her right hip due to new right hip pain since being seen by the emergency department and they put her on the stretcher.  Imaging with no evidence of fracture.  Given patient's AKI we gave her 500 cc LR infusion.  Started her on 79 cc/h of sodium chloride infusion.  Tylenol for pain.  With New Acute on chronic kidney injury and hyponatremia, as well as syncopal episode, I feel that patient needs to be worked up in an inpatient setting.  Discussed this case intensively with Dr. Bernette Mayers who agrees with the plan going forward.  At 2103, I spoke with Dr. Leafy Half who who agrees to accept the patient.  He recommended  continuing normal saline at 75 cc/h given hyponatremia.  He also recommends ordering a second BMP.    Final Clinical Impression(s) / ED Diagnoses Final diagnoses:  AKI (acute kidney injury) (HCC)  Syncope, unspecified syncope type  Hyponatremia    Rx / DC Orders ED Discharge Orders     None        Claudie Leach, PA-C 10/06/20 2247    Pollyann Savoy, MD 10/06/20 2328

## 2020-10-06 NOTE — Telephone Encounter (Signed)
Pt daughter called in to schedule an appt (PCP order) because of moms blood pressure... not sure if it is high or low. While on the call advised daughter due to limited availability that it would be safe to have her to speak with a nurse that could possibly get them in sooner. During the entire call pts daughter is trying to get her mom to respond and from what I could hear doesn't seem as if she was responsive.. daughter stopped me while beginning bp dot phrase stating she was calling ems but still wanted me to schedule appt. Reached back out to daughter shortly after to see if I should scheduled appt, no answer, voicemail full.

## 2020-10-06 NOTE — ED Notes (Signed)
Pt is restless due to pain in rt hip. Daughter states when EMS picked her up to place her in bed they felt a pop feeling. No discoloration or swelling noted.

## 2020-10-06 NOTE — Telephone Encounter (Signed)
-----   Message from Quintella Reichert, MD sent at 10/05/2020  8:00 PM EDT ----- Normal home sleep study so in lab PSG will be ordered

## 2020-10-06 NOTE — Patient Instructions (Addendum)
I do see the variable blood pressure readings, but with some of the lower readings, I am hesitant to change meds at this point. I do recommend discussing these readings and plan with cardiology at a visit and they can look at when you are taking medicines and how they are being combined.  Return to the clinic or go to the nearest emergency room if any of your symptoms worsen or new symptoms occur.  I will check a urine culture, but without any new urinary symptoms I am hesitant to start an antibiotic right now, as possible asymptomatic bacteriuria.  Continue follow-up with urology, and if any new symptoms be seen right away as we discussed.  I will check your blood work today and if there are concerns we will let you know.  Recheck in 1 month but please let me know if there are questions or if I can help sooner.  Take care!

## 2020-10-07 ENCOUNTER — Observation Stay (HOSPITAL_COMMUNITY): Payer: PPO

## 2020-10-07 ENCOUNTER — Encounter (HOSPITAL_COMMUNITY): Payer: Self-pay | Admitting: Internal Medicine

## 2020-10-07 DIAGNOSIS — Z8674 Personal history of sudden cardiac arrest: Secondary | ICD-10-CM | POA: Diagnosis not present

## 2020-10-07 DIAGNOSIS — R011 Cardiac murmur, unspecified: Secondary | ICD-10-CM | POA: Diagnosis present

## 2020-10-07 DIAGNOSIS — Z8249 Family history of ischemic heart disease and other diseases of the circulatory system: Secondary | ICD-10-CM | POA: Diagnosis not present

## 2020-10-07 DIAGNOSIS — Z20822 Contact with and (suspected) exposure to covid-19: Secondary | ICD-10-CM | POA: Diagnosis present

## 2020-10-07 DIAGNOSIS — N319 Neuromuscular dysfunction of bladder, unspecified: Secondary | ICD-10-CM | POA: Diagnosis present

## 2020-10-07 DIAGNOSIS — N189 Chronic kidney disease, unspecified: Secondary | ICD-10-CM

## 2020-10-07 DIAGNOSIS — E860 Dehydration: Secondary | ICD-10-CM

## 2020-10-07 DIAGNOSIS — Z823 Family history of stroke: Secondary | ICD-10-CM | POA: Diagnosis not present

## 2020-10-07 DIAGNOSIS — Z87440 Personal history of urinary (tract) infections: Secondary | ICD-10-CM | POA: Diagnosis not present

## 2020-10-07 DIAGNOSIS — H5462 Unqualified visual loss, left eye, normal vision right eye: Secondary | ICD-10-CM | POA: Diagnosis present

## 2020-10-07 DIAGNOSIS — R55 Syncope and collapse: Principal | ICD-10-CM

## 2020-10-07 DIAGNOSIS — E785 Hyperlipidemia, unspecified: Secondary | ICD-10-CM | POA: Diagnosis present

## 2020-10-07 DIAGNOSIS — E118 Type 2 diabetes mellitus with unspecified complications: Secondary | ICD-10-CM | POA: Diagnosis not present

## 2020-10-07 DIAGNOSIS — I129 Hypertensive chronic kidney disease with stage 1 through stage 4 chronic kidney disease, or unspecified chronic kidney disease: Secondary | ICD-10-CM | POA: Diagnosis present

## 2020-10-07 DIAGNOSIS — Z8673 Personal history of transient ischemic attack (TIA), and cerebral infarction without residual deficits: Secondary | ICD-10-CM | POA: Diagnosis not present

## 2020-10-07 DIAGNOSIS — E871 Hypo-osmolality and hyponatremia: Secondary | ICD-10-CM | POA: Diagnosis present

## 2020-10-07 DIAGNOSIS — I69351 Hemiplegia and hemiparesis following cerebral infarction affecting right dominant side: Secondary | ICD-10-CM | POA: Diagnosis not present

## 2020-10-07 DIAGNOSIS — N1832 Chronic kidney disease, stage 3b: Secondary | ICD-10-CM | POA: Diagnosis present

## 2020-10-07 DIAGNOSIS — Z79899 Other long term (current) drug therapy: Secondary | ICD-10-CM | POA: Diagnosis not present

## 2020-10-07 DIAGNOSIS — N179 Acute kidney failure, unspecified: Secondary | ICD-10-CM | POA: Diagnosis present

## 2020-10-07 DIAGNOSIS — D631 Anemia in chronic kidney disease: Secondary | ICD-10-CM | POA: Diagnosis present

## 2020-10-07 DIAGNOSIS — N760 Acute vaginitis: Secondary | ICD-10-CM | POA: Diagnosis present

## 2020-10-07 DIAGNOSIS — E1122 Type 2 diabetes mellitus with diabetic chronic kidney disease: Secondary | ICD-10-CM | POA: Diagnosis present

## 2020-10-07 DIAGNOSIS — F419 Anxiety disorder, unspecified: Secondary | ICD-10-CM | POA: Diagnosis present

## 2020-10-07 DIAGNOSIS — Z87891 Personal history of nicotine dependence: Secondary | ICD-10-CM | POA: Diagnosis not present

## 2020-10-07 DIAGNOSIS — R627 Adult failure to thrive: Secondary | ICD-10-CM | POA: Diagnosis present

## 2020-10-07 DIAGNOSIS — N390 Urinary tract infection, site not specified: Secondary | ICD-10-CM | POA: Diagnosis present

## 2020-10-07 LAB — CBC
HCT: 25.9 % — ABNORMAL LOW (ref 36.0–46.0)
Hemoglobin: 9 g/dL — ABNORMAL LOW (ref 12.0–15.0)
MCH: 31 pg (ref 26.0–34.0)
MCHC: 34.7 g/dL (ref 30.0–36.0)
MCV: 89.3 fL (ref 80.0–100.0)
Platelets: 278 10*3/uL (ref 150–400)
RBC: 2.9 MIL/uL — ABNORMAL LOW (ref 3.87–5.11)
RDW: 12.5 % (ref 11.5–15.5)
WBC: 10.2 10*3/uL (ref 4.0–10.5)
nRBC: 0 % (ref 0.0–0.2)

## 2020-10-07 LAB — GLUCOSE, CAPILLARY
Glucose-Capillary: 121 mg/dL — ABNORMAL HIGH (ref 70–99)
Glucose-Capillary: 201 mg/dL — ABNORMAL HIGH (ref 70–99)
Glucose-Capillary: 80 mg/dL (ref 70–99)
Glucose-Capillary: 164 mg/dL — ABNORMAL HIGH (ref 70–99)

## 2020-10-07 LAB — BASIC METABOLIC PANEL
Anion gap: 5 (ref 5–15)
BUN: 42 mg/dL — ABNORMAL HIGH (ref 8–23)
CO2: 25 mmol/L (ref 22–32)
Calcium: 9.1 mg/dL (ref 8.9–10.3)
Chloride: 99 mmol/L (ref 98–111)
Creatinine, Ser: 1.92 mg/dL — ABNORMAL HIGH (ref 0.44–1.00)
GFR, Estimated: 27 mL/min — ABNORMAL LOW (ref >60)
Glucose, Bld: 147 mg/dL — ABNORMAL HIGH (ref 70–99)
Potassium: 4.3 mmol/L (ref 3.5–5.1)
Sodium: 129 mmol/L — ABNORMAL LOW (ref 135–145)

## 2020-10-07 LAB — ECHOCARDIOGRAM COMPLETE
Area-P 1/2: 2.16 cm2
Height: 65 in
S' Lateral: 2.9 cm
Weight: 2363.33 oz

## 2020-10-07 MED ORDER — TAMSULOSIN HCL 0.4 MG PO CAPS
0.4000 mg | ORAL_CAPSULE | Freq: Every day | ORAL | Status: DC
  Administered 2020-10-07 – 2020-10-10 (×4): 0.4 mg via ORAL
  Filled 2020-10-07 (×4): qty 1

## 2020-10-07 MED ORDER — LACTATED RINGERS IV SOLN: INTRAVENOUS | Status: DC

## 2020-10-07 MED ORDER — INSULIN ASPART 100 UNIT/ML IJ SOLN: 0.0000 [IU] | Freq: Every day | INTRAMUSCULAR | Status: DC

## 2020-10-07 MED ORDER — ROSUVASTATIN CALCIUM 5 MG PO TABS
5.0000 mg | ORAL_TABLET | ORAL | Status: DC
  Administered 2020-10-07 – 2020-10-09 (×2): 5 mg via ORAL
  Filled 2020-10-07 (×4): qty 1

## 2020-10-07 MED ORDER — ACETAMINOPHEN 325 MG PO TABS
650.0000 mg | ORAL_TABLET | Freq: Four times a day (QID) | ORAL | Status: DC | PRN
  Administered 2020-10-07 – 2020-10-09 (×7): 650 mg via ORAL
  Filled 2020-10-07 (×7): qty 2

## 2020-10-07 MED ORDER — AMLODIPINE BESYLATE 10 MG PO TABS
10.0000 mg | ORAL_TABLET | Freq: Every day | ORAL | Status: DC
  Administered 2020-10-07 – 2020-10-10 (×4): 10 mg via ORAL
  Filled 2020-10-07 (×4): qty 1

## 2020-10-07 MED ORDER — HEPARIN SODIUM (PORCINE) 5000 UNIT/ML IJ SOLN
5000.0000 [IU] | Freq: Three times a day (TID) | INTRAMUSCULAR | Status: DC
  Administered 2020-10-07 – 2020-10-10 (×10): 5000 [IU] via SUBCUTANEOUS
  Filled 2020-10-07 (×10): qty 1

## 2020-10-07 MED ORDER — CLOPIDOGREL BISULFATE 75 MG PO TABS
75.0000 mg | ORAL_TABLET | Freq: Every day | ORAL | Status: DC
  Administered 2020-10-07 – 2020-10-10 (×4): 75 mg via ORAL
  Filled 2020-10-07 (×4): qty 1

## 2020-10-07 MED ORDER — ACETAMINOPHEN 650 MG RE SUPP: 650.0000 mg | Freq: Four times a day (QID) | RECTAL | Status: DC | PRN

## 2020-10-07 MED ORDER — INSULIN ASPART 100 UNIT/ML IJ SOLN
0.0000 [IU] | Freq: Three times a day (TID) | INTRAMUSCULAR | Status: DC
  Administered 2020-10-07: 3 [IU] via SUBCUTANEOUS
  Administered 2020-10-07: 1 [IU] via SUBCUTANEOUS
  Administered 2020-10-08 – 2020-10-09 (×2): 2 [IU] via SUBCUTANEOUS

## 2020-10-07 MED ORDER — HYDRALAZINE HCL 25 MG PO TABS
25.0000 mg | ORAL_TABLET | Freq: Three times a day (TID) | ORAL | Status: DC
  Administered 2020-10-07 – 2020-10-10 (×9): 25 mg via ORAL
  Filled 2020-10-07 (×10): qty 1

## 2020-10-07 MED ORDER — CARVEDILOL 3.125 MG PO TABS
3.1250 mg | ORAL_TABLET | Freq: Two times a day (BID) | ORAL | Status: DC
  Administered 2020-10-07 – 2020-10-10 (×7): 3.125 mg via ORAL
  Filled 2020-10-07 (×7): qty 1

## 2020-10-07 NOTE — Telephone Encounter (Signed)
Spoke to patient's daughter Smith Robert she stated mother had a syncopal episode yesterday.She was admitted to [facility].Stated her B/P has been elevated and she has abnormal kidney functions.Advised I will make Dr.Christopher aware.

## 2020-10-07 NOTE — Evaluation (Signed)
Physical Therapy Evaluation Patient Details Name: Chelsea [name] MRN: [id number] DOB: 11-04-1945 Today's Date: 10/07/2020  History of Present Illness  75 y.o. female with medical history significant for HTN, DMT2, CKD 3, HLD, CVA, recurrent UTIs and admitted 10/06/20 after having syncopal episode at home.  Clinical Impression  Pt admitted with above diagnosis. Pt currently with functional limitations due to the deficits listed below (see PT Problem List). Pt will benefit from skilled PT to increase their independence and safety with mobility to allow discharge to the venue listed below.  Pt assisted with ambulating in room and encouraged to remain OOB and in recliner end of session.  Pt typically ambulatory around home with daughter assisting as needed.  Pt also has a w/c.  Daughter feels she can continue to assist pt at home so plan is for home upon d/c.  Recommend HHPT if pt agreeable.      Recommendations for follow up therapy are one component of a multi-disciplinary discharge planning process, led by the attending physician.  Recommendations may be updated based on patient status, additional functional criteria and insurance authorization.  Follow Up Recommendations Supervision for mobility/OOB;Home health PT    Equipment Recommendations  None recommended by PT    Recommendations for Other Services       Precautions / Restrictions Precautions Precautions: Fall Restrictions Weight Bearing Restrictions: No      Mobility  Bed Mobility Overal bed mobility: Needs Assistance Bed Mobility: Supine to Sit     Supine to sit: Max assist;HOB elevated     General bed mobility comments: required assist for Rt LE over EOB and trunk upright    Transfers Overall transfer level: Needs assistance Equipment used: Rolling walker (2 wheeled) Transfers: Sit to/from Stand Sit to Stand: Min assist;From elevated surface         General transfer comment: verbal cues for self  assist  Ambulation/Gait Ambulation/Gait assistance: Min guard Gait Distance (Feet): 10 Feet Assistive device: Rolling walker (2 wheeled) Gait Pattern/deviations: Step-to pattern;Decreased stance time - right;Antalgic Gait velocity: decr   General Gait Details: antalgic gait due to chronic right leg pain, pt and family feel ambulation is near baseline except pt typically able to ambulate farther No c/o dizziness.  Stairs            Wheelchair Mobility    Modified Rankin (Stroke Patients Only)       Balance Overall balance assessment: Needs assistance         Standing balance support: Bilateral upper extremity supported Standing balance-Leahy Scale: Poor Standing balance comment: reliant on UE and min external assist                             Pertinent Vitals/Pain Pain Assessment: Faces Faces Pain Scale: Hurts even more Pain Location: right hip Pain Descriptors / Indicators: Discomfort;Grimacing Pain Intervention(s): Repositioned;Monitored during session (RN bringing tylenol)    Home Living Family/patient expects to be discharged to:: Private residence Living Arrangements: Children Available Help at Discharge: Family;Available 24 hours/day Type of Home: House Home Access: Ramped entrance;Stairs to enter Entrance Stairs-Rails: Can reach both Entrance Stairs-Number of Steps: 4 Home Layout: One level Home Equipment: Emergency planning/management officer - 2 wheels;Bedside commode;Grab bars - tub/shower;Grab bars - toilet;Other (comment) Additional Comments: lift chair    Prior Function Level of Independence: Needs assistance   Gait / Transfers Assistance Needed: ambulates with walker with assistance, needs assist through doorways when in wheelchair  ADL's /  Homemaking Assistance Needed: patient can feed herself, perform grooming/hygiene, wash upper body but daughter assists with lower body ADLs, cutting up her food.  Comments: patient with memory deficits d/t CVA,  daughter having to help reminder patient of things     Hand Dominance        Extremity/Trunk Assessment   Upper Extremity Assessment Upper Extremity Assessment: Generalized weakness    Lower Extremity Assessment Lower Extremity Assessment: Generalized weakness;RLE deficits/detail RLE Deficits / Details: requiring assist for bed mobility, pt reports chronic right leg pain (states both hip and nerve)       Communication   Communication: No difficulties  Cognition Arousal/Alertness: Awake/alert Behavior During Therapy: Flat affect                                   General Comments: pt not very agreeable but eventually will work with therapist, daughter reports pt is difficult to work with      General Comments      Exercises     Assessment/Plan    PT Assessment Patient needs continued PT services  PT Problem List Decreased activity tolerance;Decreased balance;Decreased knowledge of use of DME;Decreased mobility;Decreased strength       PT Treatment Interventions DME instruction;Gait training;Balance training;Therapeutic exercise;Functional mobility training;Therapeutic activities;Patient/family education;Wheelchair mobility training    PT Goals (Current goals can be found in the Care Plan section)  Acute Rehab PT Goals PT Goal Formulation: With patient Time For Goal Achievement: 10/21/20 Potential to Achieve Goals: Good    Frequency Min 3X/week   Barriers to discharge        Co-evaluation               AM-PAC PT "6 Clicks" Mobility  Outcome Measure Help needed turning from your back to your side while in a flat bed without using bedrails?: A Lot Help needed moving from lying on your back to sitting on the side of a flat bed without using bedrails?: A Lot Help needed moving to and from a bed to a chair (including a wheelchair)?: A Little Help needed standing up from a chair using your arms (e.g., wheelchair or bedside chair)?: A  Little Help needed to walk in hospital room?: A Little Help needed climbing 3-5 steps with a railing? : A Lot 6 Click Score: 15    End of Session Equipment Utilized During Treatment: Gait belt Activity Tolerance: Patient tolerated treatment well Patient left: in chair;with call bell/phone within reach;with chair alarm set;with family/visitor present Nurse Communication: Mobility status;Patient requests pain meds PT Visit Diagnosis: Difficulty in walking, not elsewhere classified (R26.2);Muscle weakness (generalized) (M62.81)    Time: 1610-9604 PT Time Calculation (min) (ACUTE ONLY): 21 min   Charges:   PT Evaluation $PT Eval Low Complexity: 1 Low        Kati PT, DPT Acute Rehabilitation Services Pager: [phone] Office: [phone]   Janan Halter [place] 10/07/2020, 12:38 PM

## 2020-10-07 NOTE — Plan of Care (Signed)

## 2020-10-07 NOTE — Progress Notes (Signed)
PROGRESS NOTE   Chelsea [name]  BJY:[id number]    DOB: 08/05/1945    DOA: 10/06/2020  PCP: Shade Flood, MD   I have briefly reviewed patients previous medical records in [facility].  Chief Complaint  Patient presents with   Loss of Consciousness    Brief Narrative:  75 year old female, lives with her daughter, ambulates with the help of a rolling walker and assistance, medical history significant for HTN, DM2, CKD stage III, HLD, CVA with residual right hemiparesis who presented after having a syncopal episode at home.  Earlier in the day, she had had been to her PCPs office for routine follow-up, BP reportedly elevated but no medication changes were made, upon getting home patient passed out while she was sitting at her kitchen table for about 3 minutes preceded by sweating and not feeling well.  Admitted for syncope, AKI and hyponatremia.   Assessment & Plan:  Principal Problem:   Syncope Active Problems:   Essential hypertension   History of CVA (cerebrovascular accident)   Type 2 diabetes mellitus with complication, without long-term current use of insulin (HCC)   Hyponatremia   Acute kidney injury superimposed on CKD (HCC)   Syncope without collapse: - Presentation suspicious for orthostatic hypotension although orthostatic vital signs today are negative.  Could also be due to dehydration with hyponatremia and AKI.  Low index of suspicion for seizures. - Per daughter, CBGs during the event were 120 and 160 range as checked by her and EMS. - Telemetry shows sinus rhythm without arrhythmias. - LVEF 70-75%, LV has hyperdynamic function, no regional wall abnormalities and grade 1 diastolic dysfunction.  No significant LV OT obstruction.  No aortic valve stenosis. - Follow-up CT head although low yield.  Dehydration with hyponatremia: - Unclear etiology.  Daughter reports that she has been consuming adequate fluids. - Sodium has improved from 125-129 - Brief and  gentle IV fluids and follow.  Encourage oral intake.  Acute on stage IIIb CKD - Baseline creatinine fluctuates between 1.4 and 1.7. - Presented with creatinine of 2.13 on arrival.  Prior to that creatinine was 1.43 on 9/19.  Improved to 1.92. - Continue gentle IV fluids and follow BMP in AM. - Follows with Dr. Valentino Nose with [facility].  Essential hypertension - Continue amlodipine, carvedilol and hydralazine. - Mildly uncontrolled at times.  Hyperlipidemia - Continue statins  Type II DM with renal complications - Taken off metformin by her nephrologist recently. - A1c 6.4 recently indicating good control. - NovoLog SSI.  CVA with residual right hemiparesis - No suggestion of acute stroke.  Follow CT head. - Therapies evaluation.  Anemia in CKD - Appears to be at baseline.   Body mass index is 24.58 kg/m.    DVT prophylaxis: heparin injection 5,000 Units Start: 10/07/20 0600     Code Status: Full Code Family Communication:  Disposition:  Status is: Observation  The patient will require care spanning > 2 midnights and should be moved to inpatient because: Inpatient level of care appropriate due to severity of illness  Dispo: The patient is from: Home              Anticipated d/c is to: Home              Patient currently is not medically stable to d/c.   Difficult to place patient No        Consultants:   None  Procedures:   None  Antimicrobials:    Anti-infectives (From  admission, onward)    None         Subjective:  Seen this morning along with daughter and echo tech at bedside.  No prior history of syncopal episodes.  Apart from sweating and feeling poorly, no premonitory symptoms.  No chest pain, palpitations or dyspnea.  Chronic right lower extremity weakness more than right upper extremity weakness.  Reported good oral intake of fluids.  Objective:   Vitals:   10/07/20 0620 10/07/20 0632 10/07/20 0723 10/07/20 1412  BP: (!) 164/63 (!) 128/51 (!)  154/60 (!) 152/58  Pulse: 63 66 62 64  Resp:   16 18  Temp:   98.2 F (36.8 C) 98.1 F (36.7 C)  TempSrc:   Oral Oral  SpO2:   97%   Weight:      Height:        General exam: Elderly female, moderately built and nourished lying comfortably propped up in bed without distress.  Oral mucosa with borderline hydration. Respiratory system: Clear to auscultation. Respiratory effort normal. Cardiovascular system: S1 & S2 heard, RRR. No JVD, murmurs, rubs, gallops or clicks. No pedal edema.  Telemetry personally reviewed: Sinus rhythm. Gastrointestinal system: Abdomen is nondistended, soft and nontender. No organomegaly or masses felt. Normal bowel sounds heard. Central nervous system: Alert and oriented x2. No focal neurological deficits. Extremities: Left extremities grade 5 x 5 power.  Right upper extremity grade 4+ by 5 power in right lower extremity at least grade 2 x 5 power. Skin: No rashes, lesions or ulcers Psychiatry: Judgement and insight appear impaired. Mood & affect appropriate.     Data Reviewed:   I have personally reviewed following labs and imaging studies   CBC: Recent Labs  Lab 10/06/20 1704 10/07/20 0437  WBC 10.4 10.2  NEUTROABS 8.1*  --   HGB 10.6* 9.0*  HCT 30.4* 25.9*  MCV 88.6 89.3  PLT 329 278    Basic Metabolic Panel: Recent Labs  Lab 10/06/20 1704 10/06/20 2108 10/07/20 0437  NA 125* 125* 129*  K 4.6 4.8 4.3  CL 90* 93* 99  CO2 26 24 25   GLUCOSE 267* 215* 147*  BUN 44* 44* 42*  CREATININE 2.17* 2.13* 1.92*  CALCIUM 9.8 9.2 9.1    Liver Function Tests: No results for input(s): AST, ALT, ALKPHOS, BILITOT, PROT, ALBUMIN in the last 168 hours.  CBG: Recent Labs  Lab 10/07/20 0718 10/07/20 1106  GLUCAP 121* 201*    Microbiology Studies:   Recent Results (from the past 240 hour(s))  Resp Panel by RT-PCR (Flu A&B, Covid) Nasopharyngeal Swab     Status: None   Collection Time: 10/06/20  9:06 PM   Specimen: Nasopharyngeal Swab;  Nasopharyngeal(NP) swabs in vial transport medium  Result Value Ref Range Status   SARS Coronavirus 2 by RT PCR NEGATIVE NEGATIVE Final    Comment: (NOTE) SARS-CoV-2 target nucleic acids are NOT DETECTED.  The SARS-CoV-2 RNA is generally detectable in upper respiratory specimens during the acute phase of infection. The lowest concentration of SARS-CoV-2 viral copies this assay can detect is 138 copies/mL. A negative result does not preclude SARS-Cov-2 infection and should not be used as the sole basis for treatment or other patient management decisions. A negative result may occur with  improper specimen collection/handling, submission of specimen other than nasopharyngeal swab, presence of viral mutation(s) within the areas targeted by this assay, and inadequate number of viral copies(<138 copies/mL). A negative result must be combined with clinical observations, patient history, and epidemiological information.  The expected result is Negative.  Fact Sheet for Patients:  BloggerCourse.com  Fact Sheet for Healthcare Providers:  SeriousBroker.it  This test is no t yet approved or cleared by the [place] FDA and  has been authorized for detection and/or diagnosis of SARS-CoV-2 by FDA under an Emergency Use Authorization (EUA). This EUA will remain  in effect (meaning this test can be used) for the duration of the COVID-19 declaration under Section 564(b)(1) of the Act, 21 U.S.C.section 360bbb-3(b)(1), unless the authorization is terminated  or revoked sooner.       Influenza A by PCR NEGATIVE NEGATIVE Final   Influenza B by PCR NEGATIVE NEGATIVE Final    Comment: (NOTE) The Xpert Xpress SARS-CoV-2/FLU/RSV plus assay is intended as an aid in the diagnosis of influenza from Nasopharyngeal swab specimens and should not be used as a sole basis for treatment. Nasal washings and aspirates are unacceptable for Xpert Xpress  SARS-CoV-2/FLU/RSV testing.  Fact Sheet for Patients: BloggerCourse.com  Fact Sheet for Healthcare Providers: SeriousBroker.it  This test is not yet approved or cleared by the [place] FDA and has been authorized for detection and/or diagnosis of SARS-CoV-2 by FDA under an Emergency Use Authorization (EUA). This EUA will remain in effect (meaning this test can be used) for the duration of the COVID-19 declaration under Section 564(b)(1) of the Act, 21 U.S.C. section 360bbb-3(b)(1), unless the authorization is terminated or revoked.  Performed at [facility], [address] [place], [place] [address]      Radiology Studies:  DG Chest 2 View  Result Date: 10/06/2020 CLINICAL DATA:  Dyspnea EXAM: CHEST - 2 VIEW COMPARISON:  09/30/2020 FINDINGS: Scarring at the bases. No acute consolidation, pleural effusion or pneumothorax. Cardiomegaly IMPRESSION: No active cardiopulmonary disease. Cardiomegaly with stable scarring at the bases Electronically Signed   By: Jasmine Pang M.D.   On: 10/06/2020 19:27   ECHOCARDIOGRAM COMPLETE  Result Date: 10/07/2020    ECHOCARDIOGRAM REPORT   Patient Name:   MEESHA SEK Goldberger Date of Exam: 10/07/2020 Medical Rec #:  [id number]      Height:       65.0 in Accession #:    [id number]     Weight:       147.7 lb Date of Birth:  [birth year]      BSA:          1.739 m Patient Age:    75 years       BP:           154/60 mmHg Patient Gender: F              HR:           62 bpm. Exam Location:  Inpatient Procedure: 2D Echo, Color Doppler and Cardiac Doppler Indications:    Syncope R55  History:        Patient has prior history of Echocardiogram examinations, most                 recent 11/07/2019. Carotid Disease and Stroke; Risk                 Factors:Diabetes, Hypertension and Dyslipidemia.  Sonographer:    Leta Jungling RDCS Referring Phys: [phone] Alexi Geibel D Evelin Cake IMPRESSIONS  1. Peak LV outflow  tract velocity 2.7 m/s, peak gradient 29 mmHg (no significant obstruction). Left ventricular ejection fraction, by estimation, is 70 to 75%. The left ventricle has hyperdynamic function. The left ventricle has no regional wall motion abnormalities. There  is mild left ventricular hypertrophy. Left ventricular diastolic parameters are consistent with Grade I diastolic dysfunction (impaired relaxation).  2. Right ventricular systolic function is normal. The right ventricular size is normal.  3. The mitral valve is normal in structure. No evidence of mitral valve regurgitation. No evidence of mitral stenosis. Moderate mitral annular calcification.  4. The aortic valve is tricuspid. Aortic valve regurgitation is not visualized. Mild aortic valve sclerosis is present, with no evidence of aortic valve stenosis.  5. The inferior vena cava is normal in size with greater than 50% respiratory variability, suggesting right atrial pressure of 3 mmHg. FINDINGS  Left Ventricle: Peak LV outflow tract velocity 2.7 m/s, peak gradient 29 mmHg (no significant obstruction). Left ventricular ejection fraction, by estimation, is 70 to 75%. The left ventricle has hyperdynamic function. The left ventricle has no regional  wall motion abnormalities. The left ventricular internal cavity size was normal in size. There is mild left ventricular hypertrophy. Abnormal (paradoxical) septal motion consistent with post-operative status. Left ventricular diastolic parameters are consistent with Grade I diastolic dysfunction (impaired relaxation). Right Ventricle: The right ventricular size is normal. No increase in right ventricular wall thickness. Right ventricular systolic function is normal. Left Atrium: Left atrial size was normal in size. Right Atrium: Right atrial size was normal in size. Pericardium: There is no evidence of pericardial effusion. Mitral Valve: The mitral valve is normal in structure. Moderate mitral annular calcification. No  evidence of mitral valve regurgitation. No evidence of mitral valve stenosis. Tricuspid Valve: The tricuspid valve is normal in structure. Tricuspid valve regurgitation is not demonstrated. No evidence of tricuspid stenosis. Aortic Valve: The aortic valve is tricuspid. Aortic valve regurgitation is not visualized. Mild aortic valve sclerosis is present, with no evidence of aortic valve stenosis. Pulmonic Valve: The pulmonic valve was normal in structure. Pulmonic valve regurgitation is not visualized. No evidence of pulmonic stenosis. Aorta: The aortic root is normal in size and structure. Venous: The inferior vena cava is normal in size with greater than 50% respiratory variability, suggesting right atrial pressure of 3 mmHg. IAS/Shunts: No atrial level shunt detected by color flow Doppler.  LEFT VENTRICLE PLAX 2D LVIDd:         4.40 cm  Diastology LVIDs:         2.90 cm  LV e' medial:    4.95 cm/s LV PW:         1.20 cm  LV E/e' medial:  14.4 LV IVS:        1.30 cm  LV e' lateral:   6.46 cm/s LVOT diam:     2.00 cm  LV E/e' lateral: 11.0 LV SV:         85 LV SV Index:   49 LVOT Area:     3.14 cm  RIGHT VENTRICLE RV S prime:     15.30 cm/s TAPSE (M-mode): 1.6 cm LEFT ATRIUM             Index LA diam:        4.00 cm 2.30 cm/m LA Vol (A2C):   38.3 ml 22.02 ml/m LA Vol (A4C):   33.2 ml 19.09 ml/m LA Biplane Vol: 38.3 ml 22.02 ml/m  AORTIC VALVE             PULMONIC VALVE LVOT Vmax:   105.00 cm/s PR End Diast Vel: 2.15 msec LVOT Vmean:  76.900 cm/s LVOT VTI:    0.270 m  AORTA Ao Root diam: 2.80 cm Ao Asc diam:  3.40 cm MITRAL VALVE MV Area (PHT): 2.16 cm     SHUNTS MV Decel Time: 351 msec     Systemic VTI:  0.27 m MV E velocity: 71.35 cm/s   Systemic Diam: 2.00 cm MV A velocity: 116.00 cm/s MV E/A ratio:  0.62 Donato Schultz MD Electronically signed by Donato Schultz MD Signature Date/Time: 10/07/2020/11:43:37 AM    Final    DG Hip Unilat  With Pelvis 2-3 Views Right  Result Date: 10/06/2020 CLINICAL DATA:  Hip pain  EXAM: DG HIP (WITH OR WITHOUT PELVIS) 2-3V RIGHT COMPARISON:  None. FINDINGS: SI joints are non widened. Pubic symphysis and rami appear intact. No fracture or malalignment. IMPRESSION: No acute osseous abnormality.  CT follow-up as clinically indicated Electronically Signed   By: Jasmine Pang M.D.   On: 10/06/2020 19:26     Scheduled Meds:    amLODipine  10 mg Oral Daily   carvedilol  3.125 mg Oral BID WC   clopidogrel  75 mg Oral Daily   heparin  5,000 Units Subcutaneous Q8H   hydrALAZINE  25 mg Oral TID   insulin aspart  0-5 Units Subcutaneous QHS   insulin aspart  0-9 Units Subcutaneous TID WC   rosuvastatin  5 mg Oral Q48H   tamsulosin  0.4 mg Oral Daily    Continuous Infusions:    sodium chloride 75 mL/hr at 10/06/20 2308   lactated ringers 75 mL/hr at 10/07/20 0506     LOS: 0 days     Marcellus Scott, MD, [place], [facility]. [facility]    To contact the attending provider between 7A-7P or the covering provider during after hours 7P-7A, please log into the web site [email or web address] and access using universal [place] password for that web site. If you do not have the password, please call the hospital operator.  10/07/2020, 3:36 PM

## 2020-10-07 NOTE — Telephone Encounter (Signed)
Called patient no answer.Unable to leave a message mailbox is full.

## 2020-10-07 NOTE — H&P (Signed)
History and Physical    Chelsea [name] HCW:[id number] DOB: January 02, 1946 DOA: 10/06/2020  PCP: Shade Flood, MD   Patient coming from: home  Chief Complaint: Syncope, weakness  HPI: NOVICE [name] is a 75 y.o. female with medical history significant for HTN, DMT2, CKD 3, HLD, hx of CVA who presents after having syncopal episode at home.  Earlier in the day she has been to her PCPs office for follow-up of her blood pressure.  Her medications were not changed at that time as her PCP told the daughter that he wanted a cardiologist to manage and change any blood pressure medications.  Daughter stated that her blood pressure has been running high.  After getting home she was sitting at the kitchen table after having dinner that her daughter made.  She then suddenly began to get very sweaty and states she did not feel well and then passed out for a few seconds.  She did not have any jerking or trembling according to the daughter who witnessed it.  She did not have any confusion when she came to.  EMS was called and she was brought to the emergency room at [facility].  She states she did not have any chest pain or palpitations shortness of breath nausea vomiting or diarrhea.  She denies any visual changes, slurred speech or drooping face.   ED Course: In the emergency room she had elevated blood pressures but was otherwise hemodynamically stable.  Found to have hyponatremia and acute kidney injury and hospitalist service was asked to admit for further management.  CBC was unremarkable with her baseline anemia.  Troponin was 3.  Sodium 125 potassium 4.6 chloride 90 bicarb 26 creatinine 2.17 from baseline 1.4 calcium 9.8 glucose 267.  COVID-19, influenza A and influenza B are all negative  Review of Systems:  General: Reports generalized weakness and diaphoresis. Denies fever, chills, weight loss, night sweats.  Denies dizziness. HENT: Denies head trauma, headache, denies change in hearing, tinnitus. Denies  nasal congestion Denies sore throat.  Denies difficulty swallowing Eyes: Denies blurry vision, pain in eye, drainage.  Denies discoloration of eyes. Neck: Denies pain.  Denies swelling.  Denies pain with movement. Cardiovascular: Denies chest pain, palpitations.  Denies edema.  Denies orthopnea Respiratory: Denies shortness of breath, cough.  Denies wheezing.  Denies sputum production Gastrointestinal: Denies abdominal pain, swelling.  Denies nausea, vomiting, diarrhea.  Denies melena.  Denies hematemesis. Musculoskeletal: Denies limitation of movement.  Denies deformity or swelling.  Denies pain.  Denies arthralgias or myalgias. Genitourinary: Denies pelvic pain.  Denies urinary frequency or hesitancy.  Denies dysuria.  Skin: Denies rash.  Denies petechiae, purpura, ecchymosis. Neurological: Reports syncope. Denies seizure activity. Denies paresthesia. Denies slurred speech, drooping face.  Denies visual change. Psychiatric: Denies depression, anxiety. Denies hallucinations.  Past Medical History:  Diagnosis Date   Allergy    Anemia    Blind left eye    Cardiac arrest [facility])    Carotid artery occlusion    Cataract    Depression    Diabetes mellitus without complication (HCC)    Hyperlipidemia    Hypertension    Oxygen deficiency    Stroke [facility])     Past Surgical History:  Procedure Laterality Date   ENDARTERECTOMY Left 12/03/2019   Procedure: LEFT CAROTID ENDARTERECTOMY;  Surgeon: Chuck Hint, MD;  Location: [facility] OR;  Service: Vascular;  Laterality: Left;   PATCH ANGIOPLASTY Left 12/03/2019   Procedure: PATCH ANGIOPLASTY Left Carotid Artery;  Surgeon: Waverly Ferrari  S, MD;  Location: [facility] OR;  Service: Vascular;  Laterality: Left;    Social History  reports that she has quit smoking. Her smoking use included cigarettes. She has never used smokeless tobacco. She reports that she does not currently use alcohol. She reports that she does not use drugs.  Allergies   Allergen Reactions   Codeine Anaphylaxis and Swelling    Throat swells     Penicillins Anaphylaxis and Swelling    Throat closes     Sulfa Antibiotics Anaphylaxis, Swelling and Other (See Comments)    Lips and eye swell    Benzodiazepines Hives   Lidocaine Hives   Oxycodone-Acetaminophen Other (See Comments)    unknown     Trazodone Other (See Comments)    unknown     Cephalexin Other (See Comments)    Unknown. Tolerating rocephin 06/25/20   Gabapentin Nausea And Vomiting   Lipitor [Atorvastatin]     Vomiting    Lyrica [Pregabalin]     Vomiting     Family History  Problem Relation Age of Onset   Hypertension Mother    Hyperlipidemia Mother    Diabetes Mother    Cancer Mother    Arthritis Mother    Heart attack Mother    Hypertension Father    Hyperlipidemia Father    Diabetes Father    Arthritis Father    Stroke Father    Hypertension Sister    Heart disease Sister    Heart attack Sister    Diabetes Sister    Depression Sister    COPD Sister    Arthritis Sister    Stroke Brother    Hypertension Brother    Heart disease Brother    Diabetes Brother    Depression Brother    Arthritis Brother    Stroke Daughter    Hypertension Daughter    Diabetes Daughter    Depression Daughter    Arthritis Daughter    Diabetes Son    Depression Son      Prior to Admission medications   Medication Sig Start Date End Date Taking? Authorizing Provider  amLODipine (NORVASC) 10 MG tablet Take 1 tablet (10 mg total) by mouth daily. 09/12/20  Yes Alver Sorrow, NP  clopidogrel (PLAVIX) 75 MG tablet Take 1 tablet (75 mg total) by mouth daily. 12/05/19  Yes Love, Evlyn Kanner, PA-C  Ferrous Sulfate (IRON) 325 (65 Fe) MG TABS Take by mouth as directed.   Yes [provider]  rosuvastatin (CRESTOR) 5 MG tablet Take 5 mg every other day for 2 weeks, then increase to 5 mg daily. Patient taking differently: Take 5 mg by mouth See admin instructions. Take 5 mg every  other day for 2 weeks, then increase to 5 mg daily. 06/18/20  Yes Jodelle Red, MD  tamsulosin (FLOMAX) 0.4 MG CAPS capsule Take 0.4 mg by mouth daily. 07/07/20  Yes [provider]  acetaminophen (TYLENOL) 500 MG tablet Take 1,000 mg by mouth every 6 (six) hours as needed for moderate pain.    [provider]  carvedilol (COREG) 3.125 MG tablet Take 1 tablet (3.125 mg total) by mouth 2 (two) times daily with a meal. 06/18/20   Jodelle Red, MD  Elastic Bandages & Supports (MEDICAL COMPRESSION SOCKS) MISC 1 Units by Does not apply route daily. 09/12/20   Alver Sorrow, NP  hydrALAZINE (APRESOLINE) 50 MG tablet Take 1 tablet (50 mg total) by mouth 3 (three) times daily. Patient taking differently: Take 25 mg  by mouth 3 (three) times daily. 09/12/20 03/11/21  Alver Sorrow, NP    Physical Exam: Vitals:   10/06/20 2028 10/06/20 2230 10/06/20 2306 10/07/20 0347  BP: (!) 152/65 (!) 152/69 (!) 175/65 (!) 155/60  Pulse: 77 73 75 65  Resp: 18 14 18 18   Temp:   98.4 F (36.9 C) 98.5 F (36.9 C)  TempSrc:   Oral Oral  SpO2: 98% 99% 98% 97%  Weight:   67 kg   Height:   5\' 5"  (1.651 m)     Constitutional: NAD, calm, comfortable Vitals:   10/06/20 2028 10/06/20 2230 10/06/20 2306 10/07/20 0347  BP: (!) 152/65 (!) 152/69 (!) 175/65 (!) 155/60  Pulse: 77 73 75 65  Resp: 18 14 18 18   Temp:   98.4 F (36.9 C) 98.5 F (36.9 C)  TempSrc:   Oral Oral  SpO2: 98% 99% 98% 97%  Weight:   67 kg   Height:   5\' 5"  (1.651 m)    General: WDWN, Alert and oriented x3.  Eyes: EOMI, PERRL, conjunctivae normal.  Sclera nonicteric HENT:  [place]/AT, external ears normal.  Nares patent without epistasis.  Mucous membranes are moist. Posterior pharynx clea Neck: Soft, normal range of motion, supple, no masses, Trachea midline Respiratory: clear to auscultation bilaterally, no wheezing, no crackles. Normal respiratory effort. No accessory muscle use.  Cardiovascular: Regular rate  and rhythm, no murmurs / rubs / gallops. No extremity edema. 2+ pedal pulses.  Abdomen: Soft, no tenderness, nondistended, no rebound or guarding.  No masses palpated. Bowel sounds normoactive Musculoskeletal: FROM. no cyanosis. No joint deformity upper and lower extremities. Normal muscle tone.  Skin: Warm, dry, intact no rashes, lesions, ulcers. No induration Neurologic: CN 2-12 grossly intact. Normal speech. Sensation intact, Strength 4/5 in all extremities.  No tremor Psychiatric: Normal judgment and insight. Normal mood.    Labs on Admission: I have personally reviewed following labs and imaging studies  CBC: Recent Labs  Lab 10/06/20 1704  WBC 10.4  NEUTROABS 8.1*  HGB 10.6*  HCT 30.4*  MCV 88.6  PLT 329    Basic Metabolic Panel: Recent Labs  Lab 10/06/20 1316 10/06/20 1704 10/06/20 2108  NA 124* 125* 125*  K 4.5 4.6 4.8  CL 90* 90* 93*  CO2 26 26 24   GLUCOSE 94 267* 215*  BUN 39* 44* 44*  CREATININE 1.87* 2.17* 2.13*  CALCIUM 9.7 9.8 9.2    GFR: Estimated Creatinine Clearance: 20.2 mL/min (A) (by C-G formula based on SCr of 2.13 mg/dL (H)).  Liver Function Tests: No results for input(s): AST, ALT, ALKPHOS, BILITOT, PROT, ALBUMIN in the last 168 hours.  Urine analysis:    Component Value Date/Time   COLORURINE YELLOW 06/25/2020 2312   APPEARANCEUR HAZY (A) 06/25/2020 2312   LABSPEC 1.004 (L) 06/25/2020 2312   PHURINE 6.0 06/25/2020 2312   GLUCOSEU NEGATIVE 06/25/2020 2312   HGBUR SMALL (A) 06/25/2020 2312   BILIRUBINUR negative 10/06/2020 1210   KETONESUR negative 10/06/2020 1210   KETONESUR NEGATIVE 06/25/2020 2312   PROTEINUR =30 (A) 10/06/2020 1210   PROTEINUR 30 (A) 06/25/2020 2312   UROBILINOGEN negative (A) 10/06/2020 1210   UROBILINOGEN 0.2 09/17/2008 1929   NITRITE Negative 10/06/2020 1210   NITRITE NEGATIVE 06/25/2020 2312   LEUKOCYTESUR Moderate (2+) (A) 10/06/2020 1210   LEUKOCYTESUR LARGE (A) 06/25/2020 2312    Radiological Exams on  Admission: DG Chest 2 View  Result Date: 10/06/2020 CLINICAL DATA:  Dyspnea EXAM: CHEST - 2 VIEW  COMPARISON:  09/30/2020 FINDINGS: Scarring at the bases. No acute consolidation, pleural effusion or pneumothorax. Cardiomegaly IMPRESSION: No active cardiopulmonary disease. Cardiomegaly with stable scarring at the bases Electronically Signed   By: Jasmine Pang M.D.   On: 10/06/2020 19:27   DG Hip Unilat  With Pelvis 2-3 Views Right  Result Date: 10/06/2020 CLINICAL DATA:  Hip pain EXAM: DG HIP (WITH OR WITHOUT PELVIS) 2-3V RIGHT COMPARISON:  None. FINDINGS: SI joints are non widened. Pubic symphysis and rami appear intact. No fracture or malalignment. IMPRESSION: No acute osseous abnormality.  CT follow-up as clinically indicated Electronically Signed   By: Jasmine Pang M.D.   On: 10/06/2020 19:26    EKG: Independently reviewed.  EKG reviewed and shows normal sinus rhythm with LVH by voltage criteria.  No acute ST elevation or depression.  QTc 477  Assessment/Plan Principal Problem:   Syncope Ms. Lampron is placed on telemetry for observation after having syncope while sitting in chair at table. No report of seizure activity.  CT head was not obtained in the ER and is ordered now.  Pt has hx of CVA and is at baseline per daughter.  At time syncope occurred daughter reports pt was diaphoretic and her blood sugar was 130  Active Problems:   Acute kidney injury superimposed on CKD IVF hydration with LR at 75 mL/h overnight  Monitor electrolytes and renal function in morning with labs    Essential hypertension New home medications of Coreg Norvasc hydralazine.  Monitor blood pressure    Type 2 diabetes mellitus with complication, without long-term current use of insulin Patient was taken off of metformin by her nephrologist recently. Blood sugars with meals and at bedtime.  Corrective insulin provided as needed for glycemic control.  Patient had hemoglobin A1c a week ago that was 6.4 so we will  not repeated now    Hyponatremia Sodium of 125.  IV fluid hydration.  Monitor electrolytes and renal function with labs in the morning    History of CVA (cerebrovascular accident) Patient has generalized weakness since having the stroke and will consult physical therapy in the morning for evaluation.  DVT prophylaxis: Heparin for DVT prophylaxis.   Code Status:   Full Code  Family Communication:  Diagnosis and plan discussed with patient and her daughter who is at bedside.  They both verbalized understanding and agree with plan.  Further recommendations to follow as clinical indicated Disposition Plan:   Patient is from:  Home  Anticipated DC to:  Home  Anticipated DC date:  Anticipate less than 2 midnights in hospital this may change depending on patient response to treatment    Admission status:  Observation  Carlton Adam MD [facility]  How to contact the [facility] Attending or Consulting provider 7A - 7P or covering provider during after hours 7P -7A, for this patient?   Check the care team in [facility] and look for a) attending/consulting [facility] provider listed and b) the [facility] team listed Log into [email or web address] and use [place]'s universal password to access. If you do not have the password, please contact the hospital operator. Locate the [facility] provider you are looking for under [facility] and page to a number that you can be directly reached. If you still have difficulty reaching the provider, please page the [facility] (Director on Call) for the Hospitalists listed on amion for assistance.  10/07/2020, 4:15 AM

## 2020-10-08 DIAGNOSIS — E118 Type 2 diabetes mellitus with unspecified complications: Secondary | ICD-10-CM

## 2020-10-08 LAB — URINALYSIS, ROUTINE W REFLEX MICROSCOPIC
Bilirubin Urine: NEGATIVE
Glucose, UA: NEGATIVE mg/dL
Ketones, ur: NEGATIVE mg/dL
Nitrite: NEGATIVE
Protein, ur: 30 mg/dL — AB
Specific Gravity, Urine: <1.005 — ABNORMAL LOW (ref 1.005–1.030)
WBC, UA: >50 WBC/hpf — ABNORMAL HIGH (ref 0–5)
pH: 6.5 (ref 5.0–8.0)

## 2020-10-08 LAB — CBC
HCT: 26.7 % — ABNORMAL LOW (ref 36.0–46.0)
Hemoglobin: 9.1 g/dL — ABNORMAL LOW (ref 12.0–15.0)
MCH: 31.3 pg (ref 26.0–34.0)
MCHC: 34.1 g/dL (ref 30.0–36.0)
MCV: 91.8 fL (ref 80.0–100.0)
Platelets: 254 10*3/uL (ref 150–400)
RBC: 2.91 MIL/uL — ABNORMAL LOW (ref 3.87–5.11)
RDW: 13 % (ref 11.5–15.5)
WBC: 8.3 10*3/uL (ref 4.0–10.5)
nRBC: 0 % (ref 0.0–0.2)

## 2020-10-08 LAB — BASIC METABOLIC PANEL
Anion gap: 5 (ref 5–15)
BUN: 35 mg/dL — ABNORMAL HIGH (ref 8–23)
CO2: 25 mmol/L (ref 22–32)
Calcium: 9.2 mg/dL (ref 8.9–10.3)
Chloride: 104 mmol/L (ref 98–111)
Creatinine, Ser: 1.88 mg/dL — ABNORMAL HIGH (ref 0.44–1.00)
GFR, Estimated: 27 mL/min — ABNORMAL LOW (ref >60)
Glucose, Bld: 128 mg/dL — ABNORMAL HIGH (ref 70–99)
Potassium: 4.8 mmol/L (ref 3.5–5.1)
Sodium: 134 mmol/L — ABNORMAL LOW (ref 135–145)

## 2020-10-08 LAB — GLUCOSE, CAPILLARY
Glucose-Capillary: 117 mg/dL — ABNORMAL HIGH (ref 70–99)
Glucose-Capillary: 94 mg/dL (ref 70–99)
Glucose-Capillary: 158 mg/dL — ABNORMAL HIGH (ref 70–99)
Glucose-Capillary: 122 mg/dL — ABNORMAL HIGH (ref 70–99)

## 2020-10-08 MED ORDER — SODIUM CHLORIDE 0.9 % IV SOLN
1.0000 g | INTRAVENOUS | Status: DC
  Administered 2020-10-08 – 2020-10-09 (×2): 1 g via INTRAVENOUS
  Filled 2020-10-08 (×3): qty 10

## 2020-10-08 MED ORDER — SODIUM CHLORIDE 0.9 % IV SOLN
INTRAVENOUS | Status: DC | PRN
  Administered 2020-10-08: 250 mL via INTRAVENOUS

## 2020-10-08 NOTE — TOC Progression Note (Signed)
Transition of Care [facility]) - Progression Note    Patient Details  Name: Chelsea [name] MRN: [id number] Date of Birth: Jan 02, 1946  [facility]) CM/SW Contact  Geni Bers, RN Phone Number: 10/08/2020, 3:30 PM  Clinical Narrative:    Pt from home with children and plan to return home. Will continue to follow for discharge needs.    Expected Discharge Plan: Home/Self Care Barriers to Discharge: No Barriers Identified  Expected Discharge Plan and Services Expected Discharge Plan: Home/Self Care       Living arrangements for the past 2 months: Single Family Home                                       Social Determinants of Health (SDOH) Interventions    Readmission Risk Interventions Readmission Risk Prevention Plan 06/27/2020 12/05/2019  Transportation Screening Complete Complete  PCP or Specialist Appt within 3-5 Days - Complete  HRI or Home Care Consult Complete Complete  Social Work Consult for Recovery Care Planning/Counseling Complete Complete  Palliative Care Screening Not Applicable Not Applicable  Medication Review [employer]) - Complete  Some recent data might be hidden

## 2020-10-08 NOTE — Progress Notes (Signed)
PROGRESS NOTE    Chelsea [name]  ZHY:[id number] DOB: Aug 15, 1945 DOA: 10/06/2020 PCP: Shade Flood, MD    Chief Complaint  Patient presents with   Loss of Consciousness    Brief Narrative:  75 year old female, lives with her daughter, ambulates with the help of a rolling walker and assistance, medical history significant for HTN, DM2, CKD stage III, HLD, CVA with residual right hemiparesis who presented after having a syncopal episode at home. Found to have AKI, UTI, hyponatremia  Subjective:  Feeling weak, denies pain She is frustrated about being sick Daughter at bedside   Assessment & Plan:   Principal Problem:   Syncope Active Problems:   Essential hypertension   History of CVA (cerebrovascular accident)   Type 2 diabetes mellitus with complication, without long-term current use of insulin (HCC)   Hyponatremia   Acute kidney injury superimposed on CKD (HCC)   Dehydration with hyponatremia  Syncope, likely due to dehydration, hyponatremia, UTI -CT head no acute findings - Per daughter, CBGs during the event were 120 and 160 range as checked by her and EMS. - Telemetry shows sinus rhythm without arrhythmias. - LVEF 70-75%, LV has hyperdynamic function, no regional wall abnormalities and grade 1 diastolic dysfunction.  No significant LV OT obstruction.  No aortic valve stenosis. -Orthostatic vital signs obtained after hydration unremarkable  Hyponatremia Recently started on lisinopril/hctz -Hold HCTZ -Improved on hydration  Recurrent uti with h/o neurogenic bladder Urine culture positive for gram-negative rods Start Rocephin, follow-up on final culture result  AKI on CKD 3B Improving with hydration and treating UTI Hold lisinopril/HCTZ   Anemia of chronic disease No overt sign of bleeding  Non-insulin-dependent type 2 diabetes, reasonably controlled Metformin was stopped by nephrology recently A.m. blood glucose 128  Hypertension Stable on Norvasc,  Coreg, hydralazine  History of CVA Report unsteadiness from prior cva PT eval   FTT, home health   Nutritional Assessment:  The patient's BMI is: Body mass index is 24.58 kg/m.Marland Kitchen      Unresulted Labs (From admission, onward)     Start     Ordered   10/09/20 0500  Iron and TIBC  Tomorrow morning,   R        10/08/20 2231   10/09/20 0500  Basic metabolic panel  Tomorrow morning,   R        10/08/20 2231   10/09/20 0500  Magnesium  Tomorrow morning,   R        10/08/20 2231   10/08/20 0910  Urine Culture  Once,   R       Question:  Indication  Answer:  Dysuria   10/08/20 0910              DVT prophylaxis: heparin injection 5,000 Units Start: 10/07/20 0600   Code Status:full Family Communication: daughter at bedside  Disposition:   Status is: Inpatient   Dispo: The patient is from: home               Anticipated d/c is to: Home with home health              Anticipated d/c date is: 24 to 48 hours                Consultants:  None  Procedures:  None  Antimicrobials:   Anti-infectives (From admission, onward)    Start     Dose/Rate Route Frequency Ordered Stop   10/08/20 1415  cefTRIAXone (ROCEPHIN) 1 g in sodium  chloride 0.9 % 100 mL IVPB        1 g 200 mL/hr over 30 Minutes Intravenous Every 24 hours 10/08/20 1328             Objective: Vitals:   10/07/20 2004 10/08/20 0448 10/08/20 1413 10/08/20 1954  BP: (!) 136/50 (!) 157/50 (!) 164/60 (!) 138/55  Pulse: (!) 56 (!) 56 61 (!) 59  Resp: Temp: 98.2 F (36.8 C) 98.2 F (36.8 C) 97.9 F (36.6 C) 98.6 F (37 C)  TempSrc: Oral Oral Oral Oral  SpO2: 97% 97% 96% 96%  Weight:      Height:        Intake/Output Summary (Last 24 hours) at 10/08/2020 2234 Last data filed at 10/08/2020 1600 Gross per 24 hour  Intake 731.67 ml  Output 1500 ml  Net -768.33 ml   Filed Weights   10/06/20 1635 10/06/20 2306  Weight: 63 kg 67 kg    Examination:  General exam: frail, calm,  NAD Respiratory system: Clear to auscultation. Respiratory effort normal. Cardiovascular system: S1 & S2 heard, RRR.  Gastrointestinal system: Abdomen is nondistended, soft and nontender.  Normal bowel sounds heard. Central nervous system: Alert and orientedx3 Extremities:  no edema Skin: No rashes, lesions or ulcers Psychiatry: Judgement and insight appear normal. Mood & affect appropriate.     Data Reviewed: I have personally reviewed following labs and imaging studies  CBC: Recent Labs  Lab 10/06/20 1704 10/07/20 0437 10/08/20 0430  WBC 10.4 10.2 8.3  NEUTROABS 8.1*  --   --   HGB 10.6* 9.0* 9.1*  HCT 30.4* 25.9* 26.7*  MCV 88.6 89.3 91.8  PLT 329 278 254    Basic Metabolic Panel: Recent Labs  Lab 10/06/20 1316 10/06/20 1704 10/06/20 2108 10/07/20 0437 10/08/20 0430  NA 124* 125* 125* 129* 134*  K 4.5 4.6 4.8 4.3 4.8  CL 90* 90* 93* 99 104  CO2 GLUCOSE 94 267* 215* 147* 128*  BUN 39* 44* 44* 42* 35*  CREATININE 1.87* 2.17* 2.13* 1.92* 1.88*  CALCIUM 9.7 9.8 9.2 9.1 9.2    GFR: Estimated Creatinine Clearance: 22.9 mL/min (A) (by C-G formula based on SCr of 1.88 mg/dL (H)).  Liver Function Tests: No results for input(s): AST, ALT, ALKPHOS, BILITOT, PROT, ALBUMIN in the last 168 hours.  CBG: Recent Labs  Lab 10/07/20 2001 10/08/20 0750 10/08/20 1201 10/08/20 1639 10/08/20 2001  GLUCAP 164* 117* 94 158* 122*     Recent Results (from the past 240 hour(s))  Urine Culture     Status: Abnormal (Preliminary result)   Collection Time: 10/06/20  1:16 PM   Specimen: Urine  Result Value Ref Range Status   MICRO NUMBER: [id number]  Preliminary   SPECIMEN QUALITY: Adequate  Preliminary   Sample Source NOT GIVEN  Preliminary   STATUS: PRELIMINARY  Preliminary   ISOLATE 1: Gram negative bacilli isolated (A)  Preliminary    Comment: Greater than 100,000 CFU/mL of Gram negative bacilli isolated Identification and susceptibilities to follow.   Resp Panel by RT-PCR (Flu A&B, Covid) Nasopharyngeal Swab     Status: None   Collection Time: 10/06/20  9:06 PM   Specimen: Nasopharyngeal Swab; Nasopharyngeal(NP) swabs in vial transport medium  Result Value Ref Range Status   SARS Coronavirus 2 by RT PCR NEGATIVE NEGATIVE Final    Comment: (NOTE) SARS-CoV-2 target nucleic acids are NOT DETECTED.  The SARS-CoV-2 RNA is generally detectable  in upper respiratory specimens during the acute phase of infection. The lowest concentration of SARS-CoV-2 viral copies this assay can detect is 138 copies/mL. A negative result does not preclude SARS-Cov-2 infection and should not be used as the sole basis for treatment or other patient management decisions. A negative result may occur with  improper specimen collection/handling, submission of specimen other than nasopharyngeal swab, presence of viral mutation(s) within the areas targeted by this assay, and inadequate number of viral copies(<138 copies/mL). A negative result must be combined with clinical observations, patient history, and epidemiological information. The expected result is Negative.  Fact Sheet for Patients:  BloggerCourse.com  Fact Sheet for Healthcare Providers:  SeriousBroker.it  This test is no t yet approved or cleared by the [place] FDA and  has been authorized for detection and/or diagnosis of SARS-CoV-2 by FDA under an Emergency Use Authorization (EUA). This EUA will remain  in effect (meaning this test can be used) for the duration of the COVID-19 declaration under Section 564(b)(1) of the Act, 21 U.S.C.section 360bbb-3(b)(1), unless the authorization is terminated  or revoked sooner.       Influenza A by PCR NEGATIVE NEGATIVE Final   Influenza B by PCR NEGATIVE NEGATIVE Final    Comment: (NOTE) The Xpert Xpress SARS-CoV-2/FLU/RSV plus assay is intended as an aid in the diagnosis of influenza from  Nasopharyngeal swab specimens and should not be used as a sole basis for treatment. Nasal washings and aspirates are unacceptable for Xpert Xpress SARS-CoV-2/FLU/RSV testing.  Fact Sheet for Patients: BloggerCourse.com  Fact Sheet for Healthcare Providers: SeriousBroker.it  This test is not yet approved or cleared by the [place] FDA and has been authorized for detection and/or diagnosis of SARS-CoV-2 by FDA under an Emergency Use Authorization (EUA). This EUA will remain in effect (meaning this test can be used) for the duration of the COVID-19 declaration under Section 564(b)(1) of the Act, 21 U.S.C. section 360bbb-3(b)(1), unless the authorization is terminated or revoked.  Performed at [facility], [address], [place], [place] [address]          Radiology Studies: CT HEAD WO CONTRAST ( )  Result Date: 10/07/2020 CLINICAL DATA:  Syncope, simple, normal neuro exam EXAM: CT HEAD WITHOUT CONTRAST TECHNIQUE: Contiguous axial images were obtained from the base of the skull through the vertex without intravenous contrast. COMPARISON:  October 2021 FINDINGS: Brain: There is no acute intracranial hemorrhage, mass effect, or edema. Gray-white differentiation is preserved. There is no extra-axial fluid collection. Ventricles and sulci are stable in size and configuration. Small chronic left parietal cortical/subcortical infarct. Small chronic right caudate infarct. Additional patchy hypoattenuation in the supratentorial white matter probably reflects stable chronic microvascular ischemic changes. Vascular: There is atherosclerotic calcification at the skull base. Skull: Calvarium is unremarkable. Sinuses/Orbits: No acute finding. Other: None. IMPRESSION: No acute intracranial abnormality. Stable chronic/nonemergent findings detailed above. Electronically Signed   By: Guadlupe Spanish M.D.   On: 10/07/2020 17:31    ECHOCARDIOGRAM COMPLETE  Result Date: 10/07/2020    ECHOCARDIOGRAM REPORT   Patient Name:   Chelsea [name] Muzzy Date of Exam: 10/07/2020 Medical Rec #:  [id number]      Height:       65.0 in Accession #:    [id number]     Weight:       147.7 lb Date of Birth:  12/24/1945      BSA:          1.739 m Patient Age:  75 years       BP:           154/60 mmHg Patient Gender: F              HR:           62 bpm. Exam Location:  Inpatient Procedure: 2D Echo, Color Doppler and Cardiac Doppler Indications:    Syncope R55  History:        Patient has prior history of Echocardiogram examinations, most                 recent 11/07/2019. Carotid Disease and Stroke; Risk                 Factors:Diabetes, Hypertension and Dyslipidemia.  Sonographer:    Leta Jungling RDCS Referring Phys: [phone] ANAND D HONGALGI IMPRESSIONS  1. Peak LV outflow tract velocity 2.7 m/s, peak gradient 29 mmHg (no significant obstruction). Left ventricular ejection fraction, by estimation, is 70 to 75%. The left ventricle has hyperdynamic function. The left ventricle has no regional wall motion abnormalities. There is mild left ventricular hypertrophy. Left ventricular diastolic parameters are consistent with Grade I diastolic dysfunction (impaired relaxation).  2. Right ventricular systolic function is normal. The right ventricular size is normal.  3. The mitral valve is normal in structure. No evidence of mitral valve regurgitation. No evidence of mitral stenosis. Moderate mitral annular calcification.  4. The aortic valve is tricuspid. Aortic valve regurgitation is not visualized. Mild aortic valve sclerosis is present, with no evidence of aortic valve stenosis.  5. The inferior vena cava is normal in size with greater than 50% respiratory variability, suggesting right atrial pressure of 3 mmHg. FINDINGS  Left Ventricle: Peak LV outflow tract velocity 2.7 m/s, peak gradient 29 mmHg (no significant obstruction). Left ventricular ejection fraction, by  estimation, is 70 to 75%. The left ventricle has hyperdynamic function. The left ventricle has no regional  wall motion abnormalities. The left ventricular internal cavity size was normal in size. There is mild left ventricular hypertrophy. Abnormal (paradoxical) septal motion consistent with post-operative status. Left ventricular diastolic parameters are consistent with Grade I diastolic dysfunction (impaired relaxation). Right Ventricle: The right ventricular size is normal. No increase in right ventricular wall thickness. Right ventricular systolic function is normal. Left Atrium: Left atrial size was normal in size. Right Atrium: Right atrial size was normal in size. Pericardium: There is no evidence of pericardial effusion. Mitral Valve: The mitral valve is normal in structure. Moderate mitral annular calcification. No evidence of mitral valve regurgitation. No evidence of mitral valve stenosis. Tricuspid Valve: The tricuspid valve is normal in structure. Tricuspid valve regurgitation is not demonstrated. No evidence of tricuspid stenosis. Aortic Valve: The aortic valve is tricuspid. Aortic valve regurgitation is not visualized. Mild aortic valve sclerosis is present, with no evidence of aortic valve stenosis. Pulmonic Valve: The pulmonic valve was normal in structure. Pulmonic valve regurgitation is not visualized. No evidence of pulmonic stenosis. Aorta: The aortic root is normal in size and structure. Venous: The inferior vena cava is normal in size with greater than 50% respiratory variability, suggesting right atrial pressure of 3 mmHg. IAS/Shunts: No atrial level shunt detected by color flow Doppler.  LEFT VENTRICLE PLAX 2D LVIDd:         4.40 cm  Diastology LVIDs:         2.90 cm  LV e' medial:    4.95 cm/s LV PW:  1.20 cm  LV E/e' medial:  14.4 LV IVS:        1.30 cm  LV e' lateral:   6.46 cm/s LVOT diam:     2.00 cm  LV E/e' lateral: 11.0 LV SV:         85 LV SV Index:   49 LVOT Area:     3.14  cm  RIGHT VENTRICLE RV S prime:     15.30 cm/s TAPSE (M-mode): 1.6 cm LEFT ATRIUM             Index LA diam:        4.00 cm 2.30 cm/m LA Vol (A2C):   38.3 ml 22.02 ml/m LA Vol (A4C):   33.2 ml 19.09 ml/m LA Biplane Vol: 38.3 ml 22.02 ml/m  AORTIC VALVE             PULMONIC VALVE LVOT Vmax:   105.00 cm/s PR End Diast Vel: 2.15 msec LVOT Vmean:  76.900 cm/s LVOT VTI:    0.270 m  AORTA Ao Root diam: 2.80 cm Ao Asc diam:  3.40 cm MITRAL VALVE MV Area (PHT): 2.16 cm     SHUNTS MV Decel Time: 351 msec     Systemic VTI:  0.27 m MV E velocity: 71.35 cm/s   Systemic Diam: 2.00 cm MV A velocity: 116.00 cm/s MV E/A ratio:  0.62 Donato Schultz MD Electronically signed by Donato Schultz MD Signature Date/Time: 10/07/2020/11:43:37 AM    Final         Scheduled Meds:  amLODipine  10 mg Oral Daily   carvedilol  3.125 mg Oral BID WC   clopidogrel  75 mg Oral Daily   heparin  5,000 Units Subcutaneous Q8H   hydrALAZINE  25 mg Oral TID   insulin aspart  0-5 Units Subcutaneous QHS   insulin aspart  0-9 Units Subcutaneous TID WC   rosuvastatin  5 mg Oral Q48H   tamsulosin  0.4 mg Oral Daily   Continuous Infusions:  sodium chloride 250 mL (10/08/20 1435)   cefTRIAXone (ROCEPHIN)  IV 1 g (10/08/20 1439)     LOS: 1 day   Time spent: Greater than 50% of this time was spent in counseling, explanation of diagnosis, planning of further management, and coordination of care.   Voice Recognition Reubin [place] dictation system was used to create this note, attempts have been made to correct errors. Please contact the author with questions and/or clarifications.   Albertine Grates, MD PhD FACP [facility]  Available via Epic secure chat 7am-7pm for nonurgent issues Please page for urgent issues To page the attending provider between 7A-7P or the covering provider during after hours 7P-7A, please log into the web site [email or web address] and access using universal [place] password for that web site. If you do not have  the password, please call the hospital operator.    10/08/2020, 10:34 PM

## 2020-10-08 NOTE — Plan of Care (Signed)

## 2020-10-09 DIAGNOSIS — Z8673 Personal history of transient ischemic attack (TIA), and cerebral infarction without residual deficits: Secondary | ICD-10-CM

## 2020-10-09 LAB — GLUCOSE, CAPILLARY
Glucose-Capillary: 119 mg/dL — ABNORMAL HIGH (ref 70–99)
Glucose-Capillary: 191 mg/dL — ABNORMAL HIGH (ref 70–99)
Glucose-Capillary: 118 mg/dL — ABNORMAL HIGH (ref 70–99)
Glucose-Capillary: 143 mg/dL — ABNORMAL HIGH (ref 70–99)

## 2020-10-09 LAB — IRON AND TIBC
Iron: 71 ug/dL (ref 28–170)
Saturation Ratios: 27 % (ref 10.4–31.8)
TIBC: 265 ug/dL (ref 250–450)
UIBC: 194 ug/dL

## 2020-10-09 LAB — BASIC METABOLIC PANEL
Anion gap: 8 (ref 5–15)
BUN: 39 mg/dL — ABNORMAL HIGH (ref 8–23)
CO2: 23 mmol/L (ref 22–32)
Calcium: 9.2 mg/dL (ref 8.9–10.3)
Chloride: 103 mmol/L (ref 98–111)
Creatinine, Ser: 1.87 mg/dL — ABNORMAL HIGH (ref 0.44–1.00)
GFR, Estimated: 28 mL/min — ABNORMAL LOW (ref >60)
Glucose, Bld: 120 mg/dL — ABNORMAL HIGH (ref 70–99)
Potassium: 4.4 mmol/L (ref 3.5–5.1)
Sodium: 134 mmol/L — ABNORMAL LOW (ref 135–145)

## 2020-10-09 LAB — URINE CULTURE
MICRO NUMBER:: 12422475
SPECIMEN QUALITY:: ADEQUATE

## 2020-10-09 LAB — MAGNESIUM: Magnesium: 1.9 mg/dL (ref 1.7–2.4)

## 2020-10-09 MED ORDER — CLONAZEPAM 0.125 MG PO TBDP: 0.2500 mg | ORAL_TABLET | Freq: Three times a day (TID) | ORAL | Status: DC | PRN

## 2020-10-09 MED ORDER — SACCHAROMYCES BOULARDII 250 MG PO CAPS
250.0000 mg | ORAL_CAPSULE | Freq: Two times a day (BID) | ORAL | Status: DC
  Administered 2020-10-09 – 2020-10-10 (×2): 250 mg via ORAL
  Filled 2020-10-09 (×2): qty 1

## 2020-10-09 MED ORDER — GUAIFENESIN ER 600 MG PO TB12
600.0000 mg | ORAL_TABLET | Freq: Two times a day (BID) | ORAL | Status: DC
  Administered 2020-10-09 – 2020-10-10 (×2): 600 mg via ORAL
  Filled 2020-10-09 (×2): qty 1

## 2020-10-09 MED ORDER — CYCLOBENZAPRINE HCL 5 MG PO TABS: 5.0000 mg | ORAL_TABLET | Freq: Three times a day (TID) | ORAL | Status: DC | PRN

## 2020-10-09 MED ORDER — LISINOPRIL 20 MG PO TABS
20.0000 mg | ORAL_TABLET | Freq: Every day | ORAL | Status: DC
  Administered 2020-10-09 – 2020-10-10 (×2): 20 mg via ORAL
  Filled 2020-10-09 (×2): qty 1

## 2020-10-09 NOTE — Progress Notes (Signed)
PROGRESS NOTE    Chelsea [name]  ZOX:[id number] DOB: 20-Nov-1945 DOA: 10/06/2020 PCP: Shade Flood, MD    Chief Complaint  Patient presents with   Loss of Consciousness    Brief Narrative:  75 year old female, lives with her daughter, ambulates with the help of a rolling walker and assistance, medical history significant for HTN, DM2, CKD stage III, HLD, CVA with residual right hemiparesis who presented after having a syncopal episode at home. Found to have AKI, UTI, hyponatremia  Subjective:  Feeling stronger, Good appetite, had a bath C/o muscle cramping left upper /inner thigh, reports it is chronic   Daughter at bedside   Assessment & Plan:   Principal Problem:   Syncope Active Problems:   Essential hypertension   History of CVA (cerebrovascular accident)   Type 2 diabetes mellitus with complication, without long-term current use of insulin (HCC)   Hyponatremia   Acute kidney injury superimposed on CKD (HCC)   Dehydration with hyponatremia  Syncope, likely due to dehydration, hyponatremia, UTI -CT head no acute findings - Per daughter, CBGs during the event were 120 and 160 range as checked by her and EMS. - Telemetry shows sinus rhythm without arrhythmias. - LVEF 70-75%, LV has hyperdynamic function, no regional wall abnormalities and grade 1 diastolic dysfunction.  No significant LV OT obstruction.  No aortic valve stenosis. -Orthostatic vital signs obtained after hydration unremarkable  Hyponatremia Recently started on lisinopril/hctz -Hold HCTZ -Improved on hydration  Recurrent uti with h/o neurogenic bladder Urine culture positive for gram-negative rods Start Rocephin, follow-up on final culture result  AKI on CKD 3B Improving with hydration and treating UTI Hold lisinopril/HCTZ   Anemia of chronic disease No overt sign of bleeding  Non-insulin-dependent type 2 diabetes, reasonably controlled Metformin was stopped by nephrology recently A.m.  blood glucose 128  Hypertension Stable on Norvasc, Coreg, hydralazine Restart back on lisinopril, continue hold hctz  History of CVA Report unsteadiness and right side weakness from prior cva PT eval  Anxiety, klonopin prn for anxiety per patient's request  FTT, home health   Nutritional Assessment:  The patient's BMI is: Body mass index is 24.58 kg/m.Marland Kitchen      Unresulted Labs (From admission, onward)    None         DVT prophylaxis: heparin injection 5,000 Units Start: 10/07/20 0600   Code Status:full Family Communication: daughter at bedside  Disposition:   Status is: Inpatient   Dispo: The patient is from: home               Anticipated d/c is to: Home with home health              Anticipated d/c date is: 24 to 48 hours, awaiting for urine culture result                Consultants:  None  Procedures:  None  Antimicrobials:   Anti-infectives (From admission, onward)    Start     Dose/Rate Route Frequency Ordered Stop   10/08/20 1415  cefTRIAXone (ROCEPHIN) 1 g in sodium chloride 0.9 % 100 mL IVPB        1 g 200 mL/hr over 30 Minutes Intravenous Every 24 hours 10/08/20 1328             Objective: Vitals:   10/08/20 1413 10/08/20 1954 10/09/20 0411 10/09/20 1442  BP: (!) 164/60 (!) 138/55 (!) 163/59 (!) 190/70  Pulse: 61 (!) 59 62 65  Resp: 20 16 16  20  Temp: 97.9 F (36.6 C) 98.6 F (37 C) 98.2 F (36.8 C) 98 F (36.7 C)  TempSrc: Oral Oral Oral Oral  SpO2: 96% 96% 95% 99%  Weight:      Height:        Intake/Output Summary (Last 24 hours) at 10/09/2020 1540 Last data filed at 10/08/2020 2000 Gross per 24 hour  Intake 354.17 ml  Output --  Net 354.17 ml   Filed Weights   10/06/20 1635 10/06/20 2306  Weight: 63 kg 67 kg    Examination:  General exam: frail, calm, NAD Respiratory system: Clear to auscultation. Respiratory effort normal. Cardiovascular system: S1 & S2 heard, RRR. +3/6 murmur right upper sternal  border Gastrointestinal system: Abdomen is nondistended, soft and nontender.  Normal bowel sounds heard. Central nervous system: Alert and orientedx3 Extremities:  no edema Skin: No rashes, lesions or ulcers Psychiatry: Judgement and insight appear normal. Mood & affect appropriate.     Data Reviewed: I have personally reviewed following labs and imaging studies  CBC: Recent Labs  Lab 10/06/20 1704 10/07/20 0437 10/08/20 0430  WBC 10.4 10.2 8.3  NEUTROABS 8.1*  --   --   HGB 10.6* 9.0* 9.1*  HCT 30.4* 25.9* 26.7*  MCV 88.6 89.3 91.8  PLT 329 278 254    Basic Metabolic Panel: Recent Labs  Lab 10/06/20 1704 10/06/20 2108 10/07/20 0437 10/08/20 0430 10/09/20 0400  NA 125* 125* 129* 134* 134*  K 4.6 4.8 4.3 4.8 4.4  CL 90* 93* 99 104 103  CO2 26 24 25 25 23   GLUCOSE 267* 215* 147* 128* 120*  BUN 44* 44* 42* 35* 39*  CREATININE 2.17* 2.13* 1.92* 1.88* 1.87*  CALCIUM 9.8 9.2 9.1 9.2 9.2  MG  --   --   --   --  1.9    GFR: Estimated Creatinine Clearance: 23 mL/min (A) (by C-G formula based on SCr of 1.87 mg/dL (H)).  Liver Function Tests: No results for input(s): AST, ALT, ALKPHOS, BILITOT, PROT, ALBUMIN in the last 168 hours.  CBG: Recent Labs  Lab 10/08/20 1201 10/08/20 1639 10/08/20 2001 10/09/20 0757 10/09/20 1144  GLUCAP 94 158* 122* 119* 191*     Recent Results (from the past 240 hour(s))  Urine Culture     Status: Abnormal   Collection Time: 10/06/20  1:16 PM   Specimen: Urine  Result Value Ref Range Status   MICRO NUMBER: 10/08/20  Final   SPECIMEN QUALITY: Adequate  Final   Sample Source NOT GIVEN  Final   STATUS: FINAL  Final   ISOLATE 1: raoultella planticola (A)  Final    Comment: Greater than 100,000 CFU/mL of Raoultella (Klebsiella) planticola      Susceptibility   Raoultella planticola - URINE CULTURE, REFLEX    AMOX/CLAVULANIC <=2 Sensitive     AMPICILLIN 16 Resistant     AMPICILLIN/SULBACTAM <=2 Sensitive     CEFAZOLIN* <=4 Not  Reportable      * For infections other than uncomplicated UTI caused by E. coli, K. pneumoniae or P. mirabilis: Cefazolin is resistant if MIC > or = 8 mcg/mL. (Distinguishing susceptible versus intermediate for isolates with MIC < or = 4 mcg/mL requires additional testing.) For uncomplicated UTI caused by E. coli, K. pneumoniae or P. mirabilis: Cefazolin is susceptible if MIC <32 mcg/mL and predicts susceptible to the oral agents cefaclor, cefdinir, cefpodoxime, cefprozil, cefuroxime, cephalexin and loracarbef.     CEFTAZIDIME <=1 Sensitive     CEFEPIME <=1 Sensitive  CEFTRIAXONE <=1 Sensitive     CIPROFLOXACIN <=0.25 Sensitive     LEVOFLOXACIN <=0.12 Sensitive     GENTAMICIN <=1 Sensitive     IMIPENEM 0.5 Sensitive     NITROFURANTOIN <=16 Sensitive     PIP/TAZO <=4 Sensitive     TOBRAMYCIN <=1 Sensitive     TRIMETH/SULFA* <=20 Sensitive      * For infections other than uncomplicated UTI caused by E. coli, K. pneumoniae or P. mirabilis: Cefazolin is resistant if MIC > or = 8 mcg/mL. (Distinguishing susceptible versus intermediate for isolates with MIC < or = 4 mcg/mL requires additional testing.) For uncomplicated UTI caused by E. coli, K. pneumoniae or P. mirabilis: Cefazolin is susceptible if MIC <32 mcg/mL and predicts susceptible to the oral agents cefaclor, cefdinir, cefpodoxime, cefprozil, cefuroxime, cephalexin and loracarbef. Legend: S = Susceptible  I = Intermediate R = Resistant  NS = Not susceptible * = Not tested  NR = Not reported **NN = See antimicrobic comments   Resp Panel by RT-PCR (Flu A&B, Covid) Nasopharyngeal Swab     Status: None   Collection Time: 10/06/20  9:06 PM   Specimen: Nasopharyngeal Swab; Nasopharyngeal(NP) swabs in vial transport medium  Result Value Ref Range Status   SARS Coronavirus 2 by RT PCR NEGATIVE NEGATIVE Final    Comment: (NOTE) SARS-CoV-2 target nucleic acids are NOT DETECTED.  The SARS-CoV-2 RNA is generally  detectable in upper respiratory specimens during the acute phase of infection. The lowest concentration of SARS-CoV-2 viral copies this assay can detect is 138 copies/mL. A negative result does not preclude SARS-Cov-2 infection and should not be used as the sole basis for treatment or other patient management decisions. A negative result may occur with  improper specimen collection/handling, submission of specimen other than nasopharyngeal swab, presence of viral mutation(s) within the areas targeted by this assay, and inadequate number of viral copies(<138 copies/mL). A negative result must be combined with clinical observations, patient history, and epidemiological information. The expected result is Negative.  Fact Sheet for Patients:  BloggerCourse.com  Fact Sheet for Healthcare Providers:  SeriousBroker.it  This test is no t yet approved or cleared by the [place] FDA and  has been authorized for detection and/or diagnosis of SARS-CoV-2 by FDA under an Emergency Use Authorization (EUA). This EUA will remain  in effect (meaning this test can be used) for the duration of the COVID-19 declaration under Section 564(b)(1) of the Act, 21 U.S.C.section 360bbb-3(b)(1), unless the authorization is terminated  or revoked sooner.       Influenza A by PCR NEGATIVE NEGATIVE Final   Influenza B by PCR NEGATIVE NEGATIVE Final    Comment: (NOTE) The Xpert Xpress SARS-CoV-2/FLU/RSV plus assay is intended as an aid in the diagnosis of influenza from Nasopharyngeal swab specimens and should not be used as a sole basis for treatment. Nasal washings and aspirates are unacceptable for Xpert Xpress SARS-CoV-2/FLU/RSV testing.  Fact Sheet for Patients: BloggerCourse.com  Fact Sheet for Healthcare Providers: SeriousBroker.it  This test is not yet approved or cleared by the [place] FDA  and has been authorized for detection and/or diagnosis of SARS-CoV-2 by FDA under an Emergency Use Authorization (EUA). This EUA will remain in effect (meaning this test can be used) for the duration of the COVID-19 declaration under Section 564(b)(1) of the Act, 21 U.S.C. section 360bbb-3(b)(1), unless the authorization is terminated or revoked.  Performed at [facility], [address] [place], [place] [address]   Urine Culture  Status: Abnormal (Preliminary result)   Collection Time: 10/08/20 10:42 AM   Specimen: Urine, Clean Catch  Result Value Ref Range Status   Specimen Description   Final    URINE, CLEAN CATCH Performed at [facility], [address] [place], [place] [address]    Special Requests   Final    NONE Performed at [facility], [address], [place], [place] [address]    Culture (A)  Final    >=100,000 COLONIES/mL KLEBSIELLA ORNITHINOLYTICA SUSCEPTIBILITIES TO FOLLOW Performed at [facility], [address], [place], [place] [address]    Report Status PENDING  Incomplete         Radiology Studies: CT HEAD WO CONTRAST ( )  Result Date: 10/07/2020 CLINICAL DATA:  Syncope, simple, normal neuro exam EXAM: CT HEAD WITHOUT CONTRAST TECHNIQUE: Contiguous axial images were obtained from the base of the skull through the vertex without intravenous contrast. COMPARISON:  October 2021 FINDINGS: Brain: There is no acute intracranial hemorrhage, mass effect, or edema. Gray-white differentiation is preserved. There is no extra-axial fluid collection. Ventricles and sulci are stable in size and configuration. Small chronic left parietal cortical/subcortical infarct. Small chronic right caudate infarct. Additional patchy hypoattenuation in the supratentorial white matter probably reflects stable chronic microvascular ischemic changes. Vascular: There is atherosclerotic calcification at the skull base.  Skull: Calvarium is unremarkable. Sinuses/Orbits: No acute finding. Other: None. IMPRESSION: No acute intracranial abnormality. Stable chronic/nonemergent findings detailed above. Electronically Signed   By: Guadlupe Spanish M.D.   On: 10/07/2020 17:31        Scheduled Meds:  amLODipine  10 mg Oral Daily   carvedilol  3.125 mg Oral BID WC   clopidogrel  75 mg Oral Daily   guaiFENesin  600 mg Oral BID   heparin  5,000 Units Subcutaneous Q8H   hydrALAZINE  25 mg Oral TID   insulin aspart  0-5 Units Subcutaneous QHS   insulin aspart  0-9 Units Subcutaneous TID WC   rosuvastatin  5 mg Oral Q48H   tamsulosin  0.4 mg Oral Daily   Continuous Infusions:  sodium chloride Stopped (10/08/20 1900)   cefTRIAXone (ROCEPHIN)  IV 1 g (10/09/20 1341)     LOS: 2 days   Time spent: Greater than 50% of this time was spent in counseling, explanation of diagnosis, planning of further management, and coordination of care.   Voice Recognition Reubin [place] dictation system was used to create this note, attempts have been made to correct errors. Please contact the author with questions and/or clarifications.   Albertine Grates, MD PhD FACP [facility]  Available via Epic secure chat 7am-7pm for nonurgent issues Please page for urgent issues To page the attending provider between 7A-7P or the covering provider during after hours 7P-7A, please log into the web site [email or web address] and access using universal [place] password for that web site. If you do not have the password, please call the hospital operator.    10/09/2020, 3:40 PM

## 2020-10-10 DIAGNOSIS — N390 Urinary tract infection, site not specified: Secondary | ICD-10-CM

## 2020-10-10 LAB — URINE CULTURE: Culture: >=100000 — AB

## 2020-10-10 LAB — GLUCOSE, CAPILLARY: Glucose-Capillary: 174 mg/dL — ABNORMAL HIGH (ref 70–99)

## 2020-10-10 MED ORDER — CEFDINIR 300 MG PO CAPS
300.0000 mg | ORAL_CAPSULE | Freq: Every day | ORAL | Status: DC
  Filled 2020-10-10: qty 1

## 2020-10-10 MED ORDER — GUAIFENESIN ER 600 MG PO TB12: 600.0000 mg | ORAL_TABLET | Freq: Two times a day (BID) | ORAL | 0 refills | Status: AC

## 2020-10-10 MED ORDER — CEFDINIR 300 MG PO CAPS: 300.0000 mg | ORAL_CAPSULE | Freq: Two times a day (BID) | ORAL | Status: DC

## 2020-10-10 MED ORDER — CEFDINIR 300 MG PO CAPS: 300.0000 mg | ORAL_CAPSULE | Freq: Every day | ORAL | 0 refills | Status: AC

## 2020-10-10 MED ORDER — METRONIDAZOLE 500 MG PO TABS: 500.0000 mg | ORAL_TABLET | Freq: Two times a day (BID) | ORAL | 0 refills | Status: AC

## 2020-10-10 MED ORDER — METRONIDAZOLE 500 MG PO TABS: 500.0000 mg | ORAL_TABLET | Freq: Two times a day (BID) | ORAL | Status: DC

## 2020-10-10 MED ORDER — LISINOPRIL 20 MG PO TABS: 20.0000 mg | ORAL_TABLET | Freq: Every day | ORAL | 0 refills | Status: DC

## 2020-10-10 NOTE — Care Management Important Message (Signed)
Medicare IM printed for Social Work at [facility] to give to the patient 

## 2020-10-10 NOTE — Plan of Care (Signed)
  Problem: Health Behavior/Discharge Planning: Goal: Ability to manage health-related needs will improve Outcome: Completed/Met   Problem: Clinical Measurements: Goal: Ability to maintain clinical measurements within normal limits will improve Outcome: Completed/Met   

## 2020-10-10 NOTE — Discharge Summary (Signed)
Discharge Summary  Chelsea [name] ZOX:[id number] DOB: [birth year]  PCP: Shade Flood, MD  Admit date: 10/06/2020 Discharge date: 10/10/2020  Time spent: , more than 50% time spent on coordination of care.  Recommendations for Outpatient Follow-up:  F/u with PCP within a week  for hospital discharge follow up, repeat cbc/bmp at follow up F/u with nephrology F/u with cardiology Home health   New meds:  Omincef (uti) Flagyl ( BV) lisinopril  Stop lisinopril/HCTZ    Discharge Diagnoses:  Active Hospital Problems   Diagnosis Date Noted   Syncope 10/06/2020   Hyponatremia 10/07/2020   Acute kidney injury superimposed on CKD (HCC) 10/07/2020   Dehydration with hyponatremia    Type 2 diabetes mellitus with complication, without long-term current use of insulin (HCC) 07/10/2020   History of CVA (cerebrovascular accident) 07/10/2020   Essential hypertension     Resolved Hospital Problems  No resolved problems to display.    Discharge Condition: stable  Diet recommendation: heart healthy/carb modified  Filed Weights   10/06/20 1635 10/06/20 2306  Weight: 63 kg 67 kg    History of present illness: ( per admitting MD Dr Rachael Darby) Chief Complaint: Syncope, weakness   HPI: Chelsea [name] is a 75 y.o. female with medical history significant for HTN, DMT2, CKD 3, HLD, hx of CVA who presents after having syncopal episode at home.  Earlier in the day she has been to her PCPs office for follow-up of her blood pressure.  Her medications were not changed at that time as her PCP told the daughter that he wanted a cardiologist to manage and change any blood pressure medications.  Daughter stated that her blood pressure has been running high.  After getting home she was sitting at the kitchen table after having dinner that her daughter made.  She then suddenly began to get very sweaty and states she did not feel well and then passed out for a few seconds.  She did not have  any jerking or trembling according to the daughter who witnessed it.  She did not have any confusion when she came to.  EMS was called and she was brought to the emergency room at [facility].  She states she did not have any chest pain or palpitations shortness of breath nausea vomiting or diarrhea.  She denies any visual changes, slurred speech or drooping face.    ED Course: In the emergency room she had elevated blood pressures but was otherwise hemodynamically stable.  Found to have hyponatremia and acute kidney injury and hospitalist service was asked to admit for further management.  CBC was unremarkable with her baseline anemia.  Troponin was 3.  Sodium 125 potassium 4.6 chloride 90 bicarb 26 creatinine 2.17 from baseline 1.4 calcium 9.8 glucose 267.  COVID-19, influenza A and influenza B are all negative  Hospital Course:  Principal Problem:   Syncope Active Problems:   Essential hypertension   History of CVA (cerebrovascular accident)   Type 2 diabetes mellitus with complication, without long-term current use of insulin (HCC)   Hyponatremia   Acute kidney injury superimposed on CKD (HCC)   Dehydration with hyponatremia   Syncope, likely due to dehydration, hyponatremia, UTI -CT head no acute findings - Per daughter, CBGs during the event were 120 and 160 range as checked by EMS. - Telemetry shows sinus rhythm without arrhythmias. - LVEF 70-75%, LV has hyperdynamic function, no regional wall abnormalities and grade 1 diastolic dysfunction.  No significant LV OT obstruction.  No  aortic valve stenosis. -Orthostatic vital signs obtained after hydration unremarkable -resolved    Hyponatremia Recently started on lisinopril/hctz -discontinue HCTZ -received hydration -improved    Recurrent uti with h/o neurogenic bladder Urine culture + raoultella planticola , Klebsiella ORNITHINOLYTICA   Sensitive to Rocephin, she is treated with rocephin in the hospital with improvement, she is  discharged on omnicef   AKI on CKD 3B Improving with hydration and treating UTI lisinopril/HCTZ  held in the hospital,  Resume lisinopril at discharge, discontinue HCTZ She is to follow up with nephrology   Anemia of chronic disease No overt sign of bleeding   Non-insulin-dependent type 2 diabetes, reasonably controlled Metformin was stopped by nephrology recently A.m. blood glucose 128   Hypertension Stable on Norvasc, Coreg, hydralazine Restart back on lisinopril, discontinue hctz  Cardiac murmur Denies symptom Echo obtained this hospitalization , no significant valvular abnormality reported F/u with cardiology   History of CVA Report unsteadiness and right side weakness from prior cva PT eval recommended home health   Anxiety, klonopin prn for anxiety per patient's request   FTT, home health  Bacterial vaginosis -Report vaginal fishy odor discharge -Prescribe Flagyl twice a day for 7 days F/u with pcp     Nutritional Assessment:   The patient's BMI is: Body mass index is 24.58 kg/m.Marland Kitchen     Discharge Exam: BP (!) 143/56 (BP Location: Left Arm)   Pulse 69   Temp 98.6 F (37 C) (Oral)   Resp 16   Ht  (1.651 m)   Wt 67 kg   SpO2 96%   BMI 24.58 kg/m   General: NAD Cardiovascular: RRR, + murmur Respiratory: normal respiratory effort  Discharge Instructions You were cared for by a hospitalist during your hospital stay. If you have any questions about your discharge medications or the care you received while you were in the hospital after you are discharged, you can call the unit and asked to speak with the hospitalist on call if the hospitalist that took care of you is not available. Once you are discharged, your primary care physician will handle any further medical issues. Please note that NO REFILLS for any discharge medications will be authorized once you are discharged, as it is imperative that you return to your primary care physician (or establish a  relationship with a primary care physician if you do not have one) for your aftercare needs so that they can reassess your need for medications and monitor your lab values.  Discharge Instructions     Diet - low sodium heart healthy   Complete by: As directed    Carb modified   Increase activity slowly   Complete by: As directed       Allergies as of 10/10/2020       Reactions   Codeine Anaphylaxis, Swelling   Throat swells    Latex Anaphylaxis   Penicillins Anaphylaxis, Swelling   Throat closes    Sulfa Antibiotics Anaphylaxis, Swelling, Other (See Comments)   Lips and eye swell   Benzodiazepines Hives   Lidocaine Hives   Oxycodone-acetaminophen Palpitations      Trazodone Other (See Comments)   Sweating    Cephalexin Itching   Tolerating rocephin 06/25/20   Gabapentin Nausea And Vomiting   Lipitor [atorvastatin]    Vomiting    Lyrica [pregabalin]    Vomiting         Medication List     STOP taking these medications    lisinopril-hydrochlorothiazide 20-25 MG tablet  Commonly known as: ZESTORETIC       TAKE these medications    acetaminophen 500 MG tablet Commonly known as: TYLENOL Take 1,000 mg by mouth every 6 (six) hours as needed for moderate pain.   amLODipine 10 MG tablet Commonly known as: NORVASC Take 1 tablet (10 mg total) by mouth daily.   carvedilol 3.125 MG tablet Commonly known as: COREG Take 1 tablet (3.125 mg total) by mouth 2 (two) times daily with a meal.   cefdinir 300 MG capsule Commonly known as: OMNICEF Take 1 capsule (300 mg total) by mouth daily for 3 days.   clopidogrel 75 MG tablet Commonly known as: PLAVIX Take 1 tablet (75 mg total) by mouth daily.   guaiFENesin 600 MG 12 hr tablet Commonly known as: MUCINEX Take 1 tablet (600 mg total) by mouth 2 (two) times daily.   hydrALAZINE 50 MG tablet Commonly known as: APRESOLINE Take 1 tablet (50 mg total) by mouth 3 (three) times daily. What changed:  how much to  take when to take this additional instructions   Iron 325 (65 Fe) MG Tabs Take 325 mg by mouth every other day.   lisinopril 20 MG tablet Commonly known as: ZESTRIL Take 1 tablet (20 mg total) by mouth daily. Start taking on: October 11, 2020   Medical Compression Socks Misc 1 Units by Does not apply route daily.   metroNIDAZOLE 500 MG tablet Commonly known as: FLAGYL Take 1 tablet (500 mg total) by mouth every 12 (twelve) hours for 7 days.   rosuvastatin 5 MG tablet Commonly known as: CRESTOR Take 5 mg every other day for 2 weeks, then increase to 5 mg daily. What changed:  how much to take how to take this when to take this   tamsulosin 0.4 MG Caps capsule Commonly known as: FLOMAX Take 0.4 mg by mouth every other day.       Allergies  Allergen Reactions   Codeine Anaphylaxis and Swelling    Throat swells     Latex Anaphylaxis   Penicillins Anaphylaxis and Swelling    Throat closes     Sulfa Antibiotics Anaphylaxis, Swelling and Other (See Comments)    Lips and eye swell    Benzodiazepines Hives   Lidocaine Hives   Oxycodone-Acetaminophen Palpitations         Trazodone Other (See Comments)    Sweating     Cephalexin Itching    Tolerating rocephin 06/25/20   Gabapentin Nausea And Vomiting   Lipitor [Atorvastatin]     Vomiting    Lyrica [Pregabalin]     Vomiting     Follow-up Information     Shade Flood, MD Follow up.   Specialties: Family Medicine, Sports Medicine Why: hospital discharge follow up, repeat your basic lab works including cbc/bmp Contact information: [address] [place] [address] [phone]         Jodelle Red, MD .   Specialty: Cardiology Why: f/u with cardiology for heart murmur Contact information: [address] [place] [address] [phone]         Darnell Level, MD Follow up.   Specialty: Internal Medicine Contact information: [address] [place]  [address] [phone]         [facility] Follow up.   Specialty: Home Health Services Why: Your Home Health Physical Therapy is from this company. Contact information: [address] [place] [address] [phone]  The results of significant diagnostics from this hospitalization (including imaging, microbiology, ancillary and laboratory) are listed below for reference.    Significant Diagnostic Studies: DG Chest 2 View  Result Date: 10/06/2020 CLINICAL DATA:  Dyspnea EXAM: CHEST - 2 VIEW COMPARISON:  09/30/2020 FINDINGS: Scarring at the bases. No acute consolidation, pleural effusion or pneumothorax. Cardiomegaly IMPRESSION: No active cardiopulmonary disease. Cardiomegaly with stable scarring at the bases Electronically Signed   By: Jasmine Pang M.D.   On: 10/06/2020 19:27   DG Chest 2 View  Result Date: 09/30/2020 CLINICAL DATA:  Abnormal breath sounds, rule out edema or infiltrate EXAM: CHEST - 2 VIEW COMPARISON:  08/14/2020 FINDINGS: The heart size and mediastinal contours are within normal limits. Mild, bandlike scarring or atelectasis at the bilateral lung bases, similar to prior. The visualized skeletal structures are unremarkable. IMPRESSION: Mild, bandlike scarring or atelectasis at the bilateral lung bases, similar to prior. No acute appearing airspace opacity. Electronically Signed   By: Lauralyn Primes M.D.   On: 09/30/2020 11:58   CT HEAD WO CONTRAST ( )  Result Date: 10/07/2020 CLINICAL DATA:  Syncope, simple, normal neuro exam EXAM: CT HEAD WITHOUT CONTRAST TECHNIQUE: Contiguous axial images were obtained from the base of the skull through the vertex without intravenous contrast. COMPARISON:  October 2021 FINDINGS: Brain: There is no acute intracranial hemorrhage, mass effect, or edema. Gray-white differentiation is preserved. There is no extra-axial fluid collection. Ventricles and sulci are stable in size and  configuration. Small chronic left parietal cortical/subcortical infarct. Small chronic right caudate infarct. Additional patchy hypoattenuation in the supratentorial white matter probably reflects stable chronic microvascular ischemic changes. Vascular: There is atherosclerotic calcification at the skull base. Skull: Calvarium is unremarkable. Sinuses/Orbits: No acute finding. Other: None. IMPRESSION: No acute intracranial abnormality. Stable chronic/nonemergent findings detailed above. Electronically Signed   By: Guadlupe Spanish M.D.   On: 10/07/2020 17:31   ECHOCARDIOGRAM COMPLETE  Result Date: 10/07/2020    ECHOCARDIOGRAM REPORT   Patient Name:   Chelsea [name] Bottomley Date of Exam: 10/07/2020 Medical Rec #:  [id number]      Height:       65.0 in Accession #:    [id number]     Weight:       147.7 lb Date of Birth:  1945/11/26      BSA:          1.739 m Patient Age:    75 years       BP:           154/60 mmHg Patient Gender: F              HR:           62 bpm. Exam Location:  Inpatient Procedure: 2D Echo, Color Doppler and Cardiac Doppler Indications:    Syncope R55  History:        Patient has prior history of Echocardiogram examinations, most                 recent 11/07/2019. Carotid Disease and Stroke; Risk                 Factors:Diabetes, Hypertension and Dyslipidemia.  Sonographer:    Leta Jungling RDCS Referring Phys: [phone] ANAND D HONGALGI IMPRESSIONS  1. Peak LV outflow tract velocity 2.7 m/s, peak gradient 29 mmHg (no significant obstruction). Left ventricular ejection fraction, by estimation, is 70 to 75%. The left ventricle has hyperdynamic function. The left ventricle has no regional wall motion abnormalities. There is  mild left ventricular hypertrophy. Left ventricular diastolic parameters are consistent with Grade I diastolic dysfunction (impaired relaxation).  2. Right ventricular systolic function is normal. The right ventricular size is normal.  3. The mitral valve is normal in structure. No evidence  of mitral valve regurgitation. No evidence of mitral stenosis. Moderate mitral annular calcification.  4. The aortic valve is tricuspid. Aortic valve regurgitation is not visualized. Mild aortic valve sclerosis is present, with no evidence of aortic valve stenosis.  5. The inferior vena cava is normal in size with greater than 50% respiratory variability, suggesting right atrial pressure of 3 mmHg. FINDINGS  Left Ventricle: Peak LV outflow tract velocity 2.7 m/s, peak gradient 29 mmHg (no significant obstruction). Left ventricular ejection fraction, by estimation, is 70 to 75%. The left ventricle has hyperdynamic function. The left ventricle has no regional  wall motion abnormalities. The left ventricular internal cavity size was normal in size. There is mild left ventricular hypertrophy. Abnormal (paradoxical) septal motion consistent with post-operative status. Left ventricular diastolic parameters are consistent with Grade I diastolic dysfunction (impaired relaxation). Right Ventricle: The right ventricular size is normal. No increase in right ventricular wall thickness. Right ventricular systolic function is normal. Left Atrium: Left atrial size was normal in size. Right Atrium: Right atrial size was normal in size. Pericardium: There is no evidence of pericardial effusion. Mitral Valve: The mitral valve is normal in structure. Moderate mitral annular calcification. No evidence of mitral valve regurgitation. No evidence of mitral valve stenosis. Tricuspid Valve: The tricuspid valve is normal in structure. Tricuspid valve regurgitation is not demonstrated. No evidence of tricuspid stenosis. Aortic Valve: The aortic valve is tricuspid. Aortic valve regurgitation is not visualized. Mild aortic valve sclerosis is present, with no evidence of aortic valve stenosis. Pulmonic Valve: The pulmonic valve was normal in structure. Pulmonic valve regurgitation is not visualized. No evidence of pulmonic stenosis. Aorta: The  aortic root is normal in size and structure. Venous: The inferior vena cava is normal in size with greater than 50% respiratory variability, suggesting right atrial pressure of 3 mmHg. IAS/Shunts: No atrial level shunt detected by color flow Doppler.  LEFT VENTRICLE PLAX 2D LVIDd:         4.40 cm  Diastology LVIDs:         2.90 cm  LV e' medial:    4.95 cm/s LV PW:         1.20 cm  LV E/e' medial:  14.4 LV IVS:        1.30 cm  LV e' lateral:   6.46 cm/s LVOT diam:     2.00 cm  LV E/e' lateral: 11.0 LV SV:         85 LV SV Index:   49 LVOT Area:     3.14 cm  RIGHT VENTRICLE RV S prime:     15.30 cm/s TAPSE (M-mode): 1.6 cm LEFT ATRIUM             Index LA diam:        4.00 cm 2.30 cm/m LA Vol (A2C):   38.3 ml 22.02 ml/m LA Vol (A4C):   33.2 ml 19.09 ml/m LA Biplane Vol: 38.3 ml 22.02 ml/m  AORTIC VALVE             PULMONIC VALVE LVOT Vmax:   105.00 cm/s PR End Diast Vel: 2.15 msec LVOT Vmean:  76.900 cm/s LVOT VTI:    0.270 m  AORTA Ao Root diam: 2.80 cm Ao Asc diam:  3.40 cm MITRAL VALVE MV Area (PHT): 2.16 cm     SHUNTS MV Decel Time: 351 msec     Systemic VTI:  0.27 m MV E velocity: 71.35 cm/s   Systemic Diam: 2.00 cm MV A velocity: 116.00 cm/s MV E/A ratio:  0.62 Donato Schultz MD Electronically signed by Donato Schultz MD Signature Date/Time: 10/07/2020/11:43:37 AM    Final    DG Hip Unilat  With Pelvis 2-3 Views Right  Result Date: 10/06/2020 CLINICAL DATA:  Hip pain EXAM: DG HIP (WITH OR WITHOUT PELVIS) 2-3V RIGHT COMPARISON:  None. FINDINGS: SI joints are non widened. Pubic symphysis and rami appear intact. No fracture or malalignment. IMPRESSION: No acute osseous abnormality.  CT follow-up as clinically indicated Electronically Signed   By: Jasmine Pang M.D.   On: 10/06/2020 19:26    Microbiology: Recent Results (from the past 240 hour(s))  Urine Culture     Status: Abnormal   Collection Time: 10/06/20  1:16 PM   Specimen: Urine  Result Value Ref Range Status   MICRO NUMBER: [id number]  Final    SPECIMEN QUALITY: Adequate  Final   Sample Source NOT GIVEN  Final   STATUS: FINAL  Final   ISOLATE 1: raoultella planticola (A)  Final    Comment: Greater than 100,000 CFU/mL of Raoultella (Klebsiella) planticola      Susceptibility   Raoultella planticola - URINE CULTURE, REFLEX    AMOX/CLAVULANIC <=2 Sensitive     AMPICILLIN 16 Resistant     AMPICILLIN/SULBACTAM <=2 Sensitive     CEFAZOLIN* <=4 Not Reportable      * For infections other than uncomplicated UTI caused by E. coli, K. pneumoniae or P. mirabilis: Cefazolin is resistant if MIC > or = 8 mcg/mL. (Distinguishing susceptible versus intermediate for isolates with MIC < or = 4 mcg/mL requires additional testing.) For uncomplicated UTI caused by E. coli, K. pneumoniae or P. mirabilis: Cefazolin is susceptible if MIC <32 mcg/mL and predicts susceptible to the oral agents cefaclor, cefdinir, cefpodoxime, cefprozil, cefuroxime, cephalexin and loracarbef.     CEFTAZIDIME <=1 Sensitive     CEFEPIME <=1 Sensitive     CEFTRIAXONE <=1 Sensitive     CIPROFLOXACIN <=0.25 Sensitive     LEVOFLOXACIN <=0.12 Sensitive     GENTAMICIN <=1 Sensitive     IMIPENEM 0.5 Sensitive     NITROFURANTOIN <=16 Sensitive     PIP/TAZO <=4 Sensitive     TOBRAMYCIN <=1 Sensitive     TRIMETH/SULFA* <=20 Sensitive      * For infections other than uncomplicated UTI caused by E. coli, K. pneumoniae or P. mirabilis: Cefazolin is resistant if MIC > or = 8 mcg/mL. (Distinguishing susceptible versus intermediate for isolates with MIC < or = 4 mcg/mL requires additional testing.) For uncomplicated UTI caused by E. coli, K. pneumoniae or P. mirabilis: Cefazolin is susceptible if MIC <32 mcg/mL and predicts susceptible to the oral agents cefaclor, cefdinir, cefpodoxime, cefprozil, cefuroxime, cephalexin and loracarbef. Legend: S = Susceptible  I = Intermediate R = Resistant  NS = Not susceptible * = Not tested  NR = Not reported **NN = See  antimicrobic comments   Resp Panel by RT-PCR (Flu A&B, Covid) Nasopharyngeal Swab     Status: None   Collection Time: 10/06/20  9:06 PM   Specimen: Nasopharyngeal Swab; Nasopharyngeal(NP) swabs in vial transport medium  Result Value Ref Range Status   SARS Coronavirus 2 by RT PCR NEGATIVE NEGATIVE Final    Comment: (NOTE) SARS-CoV-2 target  nucleic acids are NOT DETECTED.  The SARS-CoV-2 RNA is generally detectable in upper respiratory specimens during the acute phase of infection. The lowest concentration of SARS-CoV-2 viral copies this assay can detect is 138 copies/mL. A negative result does not preclude SARS-Cov-2 infection and should not be used as the sole basis for treatment or other patient management decisions. A negative result may occur with  improper specimen collection/handling, submission of specimen other than nasopharyngeal swab, presence of viral mutation(s) within the areas targeted by this assay, and inadequate number of viral copies(<138 copies/mL). A negative result must be combined with clinical observations, patient history, and epidemiological information. The expected result is Negative.  Fact Sheet for Patients:  BloggerCourse.com  Fact Sheet for Healthcare Providers:  SeriousBroker.it  This test is no t yet approved or cleared by the [place] FDA and  has been authorized for detection and/or diagnosis of SARS-CoV-2 by FDA under an Emergency Use Authorization (EUA). This EUA will remain  in effect (meaning this test can be used) for the duration of the COVID-19 declaration under Section 564(b)(1) of the Act, 21 U.S.C.section 360bbb-3(b)(1), unless the authorization is terminated  or revoked sooner.       Influenza A by PCR NEGATIVE NEGATIVE Final   Influenza B by PCR NEGATIVE NEGATIVE Final    Comment: (NOTE) The Xpert Xpress SARS-CoV-2/FLU/RSV plus assay is intended as an aid in the diagnosis of  influenza from Nasopharyngeal swab specimens and should not be used as a sole basis for treatment. Nasal washings and aspirates are unacceptable for Xpert Xpress SARS-CoV-2/FLU/RSV testing.  Fact Sheet for Patients: BloggerCourse.com  Fact Sheet for Healthcare Providers: SeriousBroker.it  This test is not yet approved or cleared by the [place] FDA and has been authorized for detection and/or diagnosis of SARS-CoV-2 by FDA under an Emergency Use Authorization (EUA). This EUA will remain in effect (meaning this test can be used) for the duration of the COVID-19 declaration under Section 564(b)(1) of the Act, 21 U.S.C. section 360bbb-3(b)(1), unless the authorization is terminated or revoked.  Performed at [facility], [address], [place], [place] [address]   Urine Culture     Status: Abnormal   Collection Time: 10/08/20 10:42 AM   Specimen: Urine, Clean Catch  Result Value Ref Range Status   Specimen Description   Final    URINE, CLEAN CATCH Performed at [facility], [address] [place], [place] [address]    Special Requests   Final    NONE Performed at [facility], [address], [place], [place] [address]    Culture >=100,000 COLONIES/mL KLEBSIELLA ORNITHINOLYTICA (A)  Final   Report Status 10/10/2020 FINAL  Final   Organism ID, Bacteria KLEBSIELLA ORNITHINOLYTICA (A)  Final      Susceptibility   Klebsiella ornithinolytica - MIC*    AMPICILLIN RESISTANT Resistant     CEFAZOLIN <=4 SENSITIVE Sensitive     CEFEPIME <=0.12 SENSITIVE Sensitive     CEFTRIAXONE <=0.25 SENSITIVE Sensitive     CIPROFLOXACIN <=0.25 SENSITIVE Sensitive     GENTAMICIN <=1 SENSITIVE Sensitive     IMIPENEM 0.5 SENSITIVE Sensitive     NITROFURANTOIN <=16 SENSITIVE Sensitive     TRIMETH/SULFA <=20 SENSITIVE Sensitive     AMPICILLIN/SULBACTAM <=2 SENSITIVE Sensitive      PIP/TAZO <=4 SENSITIVE Sensitive     * >=100,000 COLONIES/mL KLEBSIELLA ORNITHINOLYTICA     Labs: Basic Metabolic Panel: Recent Labs  Lab 10/06/20 1704 10/06/20 2108 10/07/20 0437  10/08/20 0430 10/09/20 0400  NA 125* 125* 129* 134* 134*  K 4.6 4.8 4.3 4.8 4.4  CL 90* 93* 99 104 103  CO2 26 24 25 25 23   GLUCOSE 267* 215* 147* 128* 120*  BUN 44* 44* 42* 35* 39*  CREATININE 2.17* 2.13* 1.92* 1.88* 1.87*  CALCIUM 9.8 9.2 9.1 9.2 9.2  MG  --   --   --   --  1.9   Liver Function Tests: No results for input(s): AST, ALT, ALKPHOS, BILITOT, PROT, ALBUMIN in the last 168 hours. No results for input(s): LIPASE, AMYLASE in the last 168 hours. No results for input(s): AMMONIA in the last 168 hours. CBC: Recent Labs  Lab 10/06/20 1704 10/07/20 0437 10/08/20 0430  WBC 10.4 10.2 8.3  NEUTROABS 8.1*  --   --   HGB 10.6* 9.0* 9.1*  HCT 30.4* 25.9* 26.7*  MCV 88.6 89.3 91.8  PLT 329 278 254   Cardiac Enzymes: No results for input(s): CKTOTAL, CKMB, CKMBINDEX, TROPONINI in the last 168 hours. BNP: BNP (last 3 results) No results for input(s): BNP in the last 8760 hours.  ProBNP (last 3 results) No results for input(s): PROBNP in the last 8760 hours.  CBG: Recent Labs  Lab 10/09/20 0757 10/09/20 1144 10/09/20 1722 10/09/20 2101 10/10/20 0756  GLUCAP 119* 191* 118* 143* 174*       Signed:  10/12/20 MD, PhD, FACP  [facility] 10/10/2020, 10:42 AM

## 2020-10-10 NOTE — TOC Progression Note (Signed)
Transition of Care [facility]) - Progression Note    Patient Details  Name: Chelsea [name] MRN: [id number] Date of Birth: 04/23/1945  Transition of Care [facility]) CM/SW Contact  Geni Bers, RN Phone Number: 10/10/2020, 10:33 AM  Clinical Narrative:     Spoke with pt's daughter Chelsea [name] at bedside [facility] was selected for HHPT. Referral given to in house rep.   Expected Discharge Plan: Home/Self Care Barriers to Discharge: No Barriers Identified  Expected Discharge Plan and Services Expected Discharge Plan: Home/Self Care       Living arrangements for the past 2 months: Single Family Home Expected Discharge Date: 10/10/20                                     Social Determinants of Health (SDOH) Interventions    Readmission Risk Interventions Readmission Risk Prevention Plan 06/27/2020 12/05/2019  Transportation Screening Complete Complete  PCP or Specialist Appt within 3-5 Days - Complete  HRI or Home Care Consult Complete Complete  Social Work Consult for Recovery Care Planning/Counseling Complete Complete  Palliative Care Screening Not Applicable Not Applicable  Medication Review [employer]) - Complete  Some recent data might be hidden

## 2020-10-10 NOTE — Plan of Care (Signed)
  Problem: Clinical Measurements: Goal: Ability to maintain clinical measurements within normal limits will improve Outcome: Completed/Met

## 2020-10-11 DIAGNOSIS — Z7902 Long term (current) use of antithrombotics/antiplatelets: Secondary | ICD-10-CM | POA: Diagnosis not present

## 2020-10-11 DIAGNOSIS — E7800 Pure hypercholesterolemia, unspecified: Secondary | ICD-10-CM | POA: Diagnosis not present

## 2020-10-11 DIAGNOSIS — N179 Acute kidney failure, unspecified: Secondary | ICD-10-CM | POA: Diagnosis not present

## 2020-10-11 DIAGNOSIS — E871 Hypo-osmolality and hyponatremia: Secondary | ICD-10-CM | POA: Diagnosis not present

## 2020-10-11 DIAGNOSIS — E1122 Type 2 diabetes mellitus with diabetic chronic kidney disease: Secondary | ICD-10-CM | POA: Diagnosis not present

## 2020-10-11 DIAGNOSIS — N390 Urinary tract infection, site not specified: Secondary | ICD-10-CM | POA: Diagnosis not present

## 2020-10-11 DIAGNOSIS — E1136 Type 2 diabetes mellitus with diabetic cataract: Secondary | ICD-10-CM | POA: Diagnosis not present

## 2020-10-11 DIAGNOSIS — H5462 Unqualified visual loss, left eye, normal vision right eye: Secondary | ICD-10-CM | POA: Diagnosis not present

## 2020-10-11 DIAGNOSIS — I69351 Hemiplegia and hemiparesis following cerebral infarction affecting right dominant side: Secondary | ICD-10-CM | POA: Diagnosis not present

## 2020-10-11 DIAGNOSIS — D631 Anemia in chronic kidney disease: Secondary | ICD-10-CM | POA: Diagnosis not present

## 2020-10-11 DIAGNOSIS — Z87891 Personal history of nicotine dependence: Secondary | ICD-10-CM | POA: Diagnosis not present

## 2020-10-11 DIAGNOSIS — N183 Chronic kidney disease, stage 3 unspecified: Secondary | ICD-10-CM | POA: Diagnosis not present

## 2020-10-11 DIAGNOSIS — G3184 Mild cognitive impairment, so stated: Secondary | ICD-10-CM | POA: Diagnosis not present

## 2020-10-11 DIAGNOSIS — F32A Depression, unspecified: Secondary | ICD-10-CM | POA: Diagnosis not present

## 2020-10-11 DIAGNOSIS — Z9181 History of falling: Secondary | ICD-10-CM | POA: Diagnosis not present

## 2020-10-11 DIAGNOSIS — I1310 Hypertensive heart and chronic kidney disease without heart failure, with stage 1 through stage 4 chronic kidney disease, or unspecified chronic kidney disease: Secondary | ICD-10-CM | POA: Diagnosis not present

## 2020-10-16 ENCOUNTER — Telehealth: Payer: Self-pay

## 2020-10-16 DIAGNOSIS — N1832 Chronic kidney disease, stage 3b: Secondary | ICD-10-CM | POA: Diagnosis not present

## 2020-10-16 NOTE — Telephone Encounter (Signed)
Home health recommended on recent hospital discharge, agree with home health evaluation, verbal order given for occupational therapy as requested, discussed with Marcelino Duster with [facility]

## 2020-10-16 NOTE — Telephone Encounter (Signed)
Caller name:Michelle a occupational therapist  with [facility]   On DPR? :yes  Call back number:[phone]  Provider they see: Neva Seat   Reason for call:  1x week for 1 week  2x week for 4 weeks for occupational therapy needs verbal orders

## 2020-10-20 ENCOUNTER — Other Ambulatory Visit (HOSPITAL_BASED_OUTPATIENT_CLINIC_OR_DEPARTMENT_OTHER): Payer: Self-pay | Admitting: Family

## 2020-10-20 NOTE — Telephone Encounter (Signed)
Pt has a hospital follow up scheduled with Dr. Cristal Deer for 10/23.

## 2020-10-21 ENCOUNTER — Telehealth: Payer: Self-pay | Admitting: Family Medicine

## 2020-10-21 NOTE — Telephone Encounter (Signed)
..  Home Health Certification or Plan of Care Tracking  Is this a Certification or Plan of Care? yes  [facility] Agency:  [facility]  Order Number:  [id number]  Has charge sheet been attached? yes  Where has form been placed:   In Dr. Paralee Cancel bin up front

## 2020-10-21 NOTE — Telephone Encounter (Signed)
Placed in providers folder to be signed at the nurses station.

## 2020-10-22 DIAGNOSIS — F32A Depression, unspecified: Secondary | ICD-10-CM

## 2020-10-22 DIAGNOSIS — E871 Hypo-osmolality and hyponatremia: Secondary | ICD-10-CM

## 2020-10-22 DIAGNOSIS — H5462 Unqualified visual loss, left eye, normal vision right eye: Secondary | ICD-10-CM

## 2020-10-22 DIAGNOSIS — N1832 Chronic kidney disease, stage 3b: Secondary | ICD-10-CM | POA: Diagnosis not present

## 2020-10-22 DIAGNOSIS — I69351 Hemiplegia and hemiparesis following cerebral infarction affecting right dominant side: Secondary | ICD-10-CM

## 2020-10-22 DIAGNOSIS — N183 Chronic kidney disease, stage 3 unspecified: Secondary | ICD-10-CM

## 2020-10-22 DIAGNOSIS — D631 Anemia in chronic kidney disease: Secondary | ICD-10-CM

## 2020-10-22 DIAGNOSIS — I129 Hypertensive chronic kidney disease with stage 1 through stage 4 chronic kidney disease, or unspecified chronic kidney disease: Secondary | ICD-10-CM | POA: Diagnosis not present

## 2020-10-22 DIAGNOSIS — Z9181 History of falling: Secondary | ICD-10-CM

## 2020-10-22 DIAGNOSIS — N179 Acute kidney failure, unspecified: Secondary | ICD-10-CM

## 2020-10-22 DIAGNOSIS — E1122 Type 2 diabetes mellitus with diabetic chronic kidney disease: Secondary | ICD-10-CM

## 2020-10-22 DIAGNOSIS — Z87891 Personal history of nicotine dependence: Secondary | ICD-10-CM

## 2020-10-22 DIAGNOSIS — E7800 Pure hypercholesterolemia, unspecified: Secondary | ICD-10-CM

## 2020-10-22 DIAGNOSIS — E1136 Type 2 diabetes mellitus with diabetic cataract: Secondary | ICD-10-CM | POA: Diagnosis not present

## 2020-10-22 DIAGNOSIS — I1310 Hypertensive heart and chronic kidney disease without heart failure, with stage 1 through stage 4 chronic kidney disease, or unspecified chronic kidney disease: Secondary | ICD-10-CM

## 2020-10-22 DIAGNOSIS — G3184 Mild cognitive impairment, so stated: Secondary | ICD-10-CM

## 2020-10-22 DIAGNOSIS — N390 Urinary tract infection, site not specified: Secondary | ICD-10-CM

## 2020-10-22 DIAGNOSIS — Z7902 Long term (current) use of antithrombotics/antiplatelets: Secondary | ICD-10-CM

## 2020-10-22 DIAGNOSIS — R600 Localized edema: Secondary | ICD-10-CM | POA: Diagnosis not present

## 2020-10-22 DIAGNOSIS — I639 Cerebral infarction, unspecified: Secondary | ICD-10-CM | POA: Diagnosis not present

## 2020-10-27 DIAGNOSIS — J189 Pneumonia, unspecified organism: Secondary | ICD-10-CM | POA: Diagnosis not present

## 2020-10-28 DIAGNOSIS — G3184 Mild cognitive impairment, so stated: Secondary | ICD-10-CM | POA: Diagnosis not present

## 2020-10-28 DIAGNOSIS — I69351 Hemiplegia and hemiparesis following cerebral infarction affecting right dominant side: Secondary | ICD-10-CM | POA: Diagnosis not present

## 2020-10-28 DIAGNOSIS — Z9181 History of falling: Secondary | ICD-10-CM | POA: Diagnosis not present

## 2020-10-28 DIAGNOSIS — E871 Hypo-osmolality and hyponatremia: Secondary | ICD-10-CM | POA: Diagnosis not present

## 2020-10-28 DIAGNOSIS — E1122 Type 2 diabetes mellitus with diabetic chronic kidney disease: Secondary | ICD-10-CM | POA: Diagnosis not present

## 2020-10-28 DIAGNOSIS — I1310 Hypertensive heart and chronic kidney disease without heart failure, with stage 1 through stage 4 chronic kidney disease, or unspecified chronic kidney disease: Secondary | ICD-10-CM | POA: Diagnosis not present

## 2020-10-28 DIAGNOSIS — N183 Chronic kidney disease, stage 3 unspecified: Secondary | ICD-10-CM | POA: Diagnosis not present

## 2020-10-28 DIAGNOSIS — E1136 Type 2 diabetes mellitus with diabetic cataract: Secondary | ICD-10-CM | POA: Diagnosis not present

## 2020-10-28 DIAGNOSIS — N390 Urinary tract infection, site not specified: Secondary | ICD-10-CM | POA: Diagnosis not present

## 2020-10-28 DIAGNOSIS — Z87891 Personal history of nicotine dependence: Secondary | ICD-10-CM | POA: Diagnosis not present

## 2020-10-28 DIAGNOSIS — N179 Acute kidney failure, unspecified: Secondary | ICD-10-CM | POA: Diagnosis not present

## 2020-10-28 DIAGNOSIS — H5462 Unqualified visual loss, left eye, normal vision right eye: Secondary | ICD-10-CM | POA: Diagnosis not present

## 2020-10-28 DIAGNOSIS — D631 Anemia in chronic kidney disease: Secondary | ICD-10-CM | POA: Diagnosis not present

## 2020-10-28 DIAGNOSIS — Z7902 Long term (current) use of antithrombotics/antiplatelets: Secondary | ICD-10-CM | POA: Diagnosis not present

## 2020-10-28 DIAGNOSIS — F32A Depression, unspecified: Secondary | ICD-10-CM | POA: Diagnosis not present

## 2020-10-28 DIAGNOSIS — E7800 Pure hypercholesterolemia, unspecified: Secondary | ICD-10-CM | POA: Diagnosis not present

## 2020-10-30 ENCOUNTER — Ambulatory Visit: Payer: PPO | Admitting: Vascular Surgery

## 2020-10-30 ENCOUNTER — Ambulatory Visit (HOSPITAL_COMMUNITY)
Admission: RE | Admit: 2020-10-30 | Discharge: 2020-10-30 | Disposition: A | Discharge status: Home / Self Care | Payer: PPO | Source: Ambulatory Visit | Attending: Vascular Surgery | Admitting: Vascular Surgery

## 2020-10-30 ENCOUNTER — Other Ambulatory Visit: Payer: Self-pay

## 2020-10-30 ENCOUNTER — Encounter: Payer: Self-pay | Admitting: Vascular Surgery

## 2020-10-30 VITALS — BP 198/80 | HR 57 | Temp 97.7°F | Resp 20 | Ht 65.0 in | Wt 140.0 lb

## 2020-10-30 DIAGNOSIS — Z48812 Encounter for surgical aftercare following surgery on the circulatory system: Secondary | ICD-10-CM

## 2020-10-30 DIAGNOSIS — I6522 Occlusion and stenosis of left carotid artery: Secondary | ICD-10-CM

## 2020-10-30 NOTE — Progress Notes (Signed)
REASON FOR VISIT:   Follow-up after left carotid endarterectomy  MEDICAL ISSUES:   S/P LEFT CAROTID ENDARTERECTOMY: The patient is doing well status post left carotid endarterectomy.  Left carotid endarterectomy site is widely patent.  She has no significant stenosis on the right.  She is on aspirin and is on Plavix.  She is not a smoker.  I have ordered a follow-up carotid duplex scan in 1 year and she will be seen on the PA schedule at that time.  She knows to call sooner if she has problems.  HYPERTENSION: The patient's initial blood pressure today was elevated. We repeated this and this was still elevated. We are trying to set her up with a family practice physician for long-term care.   HPI:   Chelsea [name] is a pleasant 75 y.o. female who was found to have a greater than 80% left carotid stenosis with no significant stenosis on the right.  She had presented with a posterior circulation stroke.  It was felt that the stenosis was asymptomatic, however given the severity of the stenosis left carotid endarterectomy was recommended.  I performed this on 12/03/2019 with bovine pericardial patch angioplasty.  Last saw her on 01/16/2020 when she was doing well.  She was set up for a 61-month carotid duplex.  Since I saw her last, she denies any new neurologic symptoms.  She does have some persistent clumsiness in the right upper extremity and right lower extremity since her preoperative stroke.  She denies any expressive or receptive aphasia or amaurosis fugax.  She is on a statin.  She is on Plavix.  For this reason she is not on aspirin.  Past Medical History:  Diagnosis Date   Allergy    Anemia    Blind left eye    Cardiac arrest (HCC)    Carotid artery occlusion    Cataract    Depression    Diabetes mellitus without complication (HCC)    Hyperlipidemia    Hypertension    Oxygen deficiency    Stroke (HCC)     Family History  Problem Relation Age of Onset   Hypertension  Mother    Hyperlipidemia Mother    Diabetes Mother    Cancer Mother    Arthritis Mother    Heart attack Mother    Hypertension Father    Hyperlipidemia Father    Diabetes Father    Arthritis Father    Stroke Father    Hypertension Sister    Heart disease Sister    Heart attack Sister    Diabetes Sister    Depression Sister    COPD Sister    Arthritis Sister    Stroke Brother    Hypertension Brother    Heart disease Brother    Diabetes Brother    Depression Brother    Arthritis Brother    Stroke Daughter    Hypertension Daughter    Diabetes Daughter    Depression Daughter    Arthritis Daughter    Diabetes Son    Depression Son     SOCIAL HISTORY: Social History   Tobacco Use   Smoking status: Former    Types: Cigarettes   Smokeless tobacco: Never  Substance Use Topics   Alcohol use: Not Currently    Allergies  Allergen Reactions   Codeine Anaphylaxis and Swelling    Throat swells     Latex Anaphylaxis   Penicillins Anaphylaxis and Swelling    Throat closes  Sulfa Antibiotics Anaphylaxis, Swelling and Other (See Comments)    Lips and eye swell    Benzodiazepines Hives   Lidocaine Hives   Oxycodone-Acetaminophen Palpitations         Trazodone Other (See Comments)    Sweating     Cephalexin Itching    Tolerating rocephin 06/25/20   Gabapentin Nausea And Vomiting   Lipitor [Atorvastatin]     Vomiting    Lyrica [Pregabalin]     Vomiting     Current Outpatient Medications  Medication Sig Dispense Refill   acetaminophen (TYLENOL) 500 MG tablet Take 1,000 mg by mouth every 6 (six) hours as needed for moderate pain.     amLODipine (NORVASC) 10 MG tablet Take 1 tablet (10 mg total) by mouth daily. 30 tablet 5   carvedilol (COREG) 3.125 MG tablet Take 1 tablet (3.125 mg total) by mouth 2 (two) times daily with a meal. 180 tablet 3   clopidogrel (PLAVIX) 75 MG tablet TAKE 1 TABLET ONCE DAILY 90 tablet 0   Elastic Bandages & Supports (MEDICAL  COMPRESSION SOCKS) MISC 1 Units by Does not apply route daily. 1 each 0   Ferrous Sulfate (IRON) 325 (65 Fe) MG TABS Take 325 mg by mouth every other day.     guaiFENesin (MUCINEX) 600 MG 12 hr tablet Take 1 tablet (600 mg total) by mouth 2 (two) times daily. 14 tablet 0   hydrALAZINE (APRESOLINE) 50 MG tablet Take 1 tablet (50 mg total) by mouth 3 (three) times daily. (Patient taking differently: Take 25 mg by mouth See admin instructions. Three times daily, at 8am, 3pm, and 10 pm.) 90 tablet 5   lisinopril (ZESTRIL) 20 MG tablet Take 1 tablet (20 mg total) by mouth daily. 30 tablet 0   rosuvastatin (CRESTOR) 5 MG tablet Take 5 mg every other day for 2 weeks, then increase to 5 mg daily. (Patient taking differently: Take 5 mg by mouth See admin instructions. Take 5 mg every other day for 2 weeks, then increase to 5 mg daily.) 90 tablet 3   tamsulosin (FLOMAX) 0.4 MG CAPS capsule Take 0.4 mg by mouth every other day.     No current facility-administered medications for this visit.    REVIEW OF SYSTEMS:  [X]  denotes positive finding, [ ]  denotes negative finding Cardiac  Comments:  Chest pain or chest pressure:    Shortness of breath upon exertion:    Short of breath when lying flat:    Irregular heart rhythm: x       Vascular    Pain in calf, thigh, or hip brought on by ambulation:    Pain in feet at night that wakes you up from your sleep:     Blood clot in your veins:    Leg swelling:  x       Pulmonary    Oxygen at home:    Productive cough:     Wheezing:         Neurologic    Sudden weakness in arms or legs:     Sudden numbness in arms or legs:     Sudden onset of difficulty speaking or slurred speech:    Temporary loss of vision in one eye:     Problems with dizziness:         Gastrointestinal    Blood in stool:     Vomited blood:         Genitourinary    Burning when urinating:  Blood in urine:        Psychiatric    Major depression:         Hematologic     Bleeding problems:    Problems with blood clotting too easily:        Skin    Rashes or ulcers:        Constitutional    Fever or chills:     PHYSICAL EXAM:   Vitals:   10/30/20 1246 10/30/20 1248  BP: (!) 188/75 (!) 198/80  Pulse: (!) 57   Resp: 20   Temp: 97.7 F (36.5 C)   SpO2: 98%   Weight: 140 lb (63.5 kg)   Height: 5\' 5"  (1.651 m)     GENERAL: The patient is a well-nourished female, in no acute distress. The vital signs are documented above. CARDIAC: There is a regular rate and rhythm.  VASCULAR: She has bilateral carotid bruits. Both feet are warm and well-perfused. PULMONARY: There is good air exchange bilaterally without wheezing or rales. ABDOMEN: Soft and non-tender with normal pitched bowel sounds.  MUSCULOSKELETAL: There are no major deformities or cyanosis. NEUROLOGIC: No focal weakness or paresthesias are detected. SKIN: There are no ulcers or rashes noted. PSYCHIATRIC: The patient has a normal affect.  DATA:    CAROTID DUPLEX: I have independently interpreted her carotid duplex scan today.  On the left side, the left carotid endarterectomy site is widely patent.  Left vertebral artery could not be identified.  On the right side there is a less than 39% stenosis.  Right vertebral artery is patent with antegrade flow.  [facility] [phone]

## 2020-10-31 ENCOUNTER — Telehealth: Payer: Self-pay | Admitting: Family Medicine

## 2020-10-31 DIAGNOSIS — J189 Pneumonia, unspecified organism: Secondary | ICD-10-CM | POA: Diagnosis not present

## 2020-10-31 NOTE — Telephone Encounter (Signed)
..  Home Health Verbal Orders  Agency:  [facility]  Caller: (Contact and title)Dennis Call back #: [phone] - can leave message Fax #:    Requesting OT/ PT/ Skilled nursing/ Social Work/ Speech:  Physical therapy  Reason for Request:    Frequency:  2 x week for 1 week                     1 x week for 3 weeks  HH needs F2F w/in last 30 days

## 2020-10-31 NOTE — Telephone Encounter (Signed)
Authorized verbal order. Maurine Minister took orders and will call beack for any other

## 2020-11-03 ENCOUNTER — Telehealth: Payer: Self-pay

## 2020-11-03 DIAGNOSIS — I63531 Cerebral infarction due to unspecified occlusion or stenosis of right posterior cerebral artery: Secondary | ICD-10-CM | POA: Diagnosis not present

## 2020-11-03 NOTE — Telephone Encounter (Signed)
Prior Authorization for NPSG sent to [facility] via Fax .

## 2020-11-03 NOTE — Telephone Encounter (Signed)
Caller name: He[facility])   On DPR? :Yes  Call back [phone]  Provider they see: Neva Seat  Reason for call:Pt has oxygen that was delivered to her house and never used and pt has no intention of using it and [facility] is saying that a discharge order for oxygen. This needs to be sent to [facility]  Fax number [phone].

## 2020-11-04 NOTE — Telephone Encounter (Signed)
Patient had a visit on 06/25/20  at the ER and Oxygen was ordered for patient that patient no longer needs. [facility] called and stated the patient Oxygen order needs to be discontinued in chart. Please D/C Oxygen order.

## 2020-11-04 NOTE — Telephone Encounter (Signed)
Heidi returning your call

## 2020-11-05 ENCOUNTER — Ambulatory Visit (INDEPENDENT_AMBULATORY_CARE_PROVIDER_SITE_OTHER): Payer: PPO | Admitting: Cardiology

## 2020-11-05 ENCOUNTER — Other Ambulatory Visit: Payer: Self-pay

## 2020-11-05 ENCOUNTER — Encounter (HOSPITAL_BASED_OUTPATIENT_CLINIC_OR_DEPARTMENT_OTHER): Payer: Self-pay | Admitting: Cardiology

## 2020-11-05 VITALS — BP 147/54 | HR 59 | Ht 65.0 in | Wt 138.4 lb

## 2020-11-05 DIAGNOSIS — I10 Essential (primary) hypertension: Secondary | ICD-10-CM | POA: Diagnosis not present

## 2020-11-05 DIAGNOSIS — N1832 Chronic kidney disease, stage 3b: Secondary | ICD-10-CM

## 2020-11-05 DIAGNOSIS — E785 Hyperlipidemia, unspecified: Secondary | ICD-10-CM | POA: Diagnosis not present

## 2020-11-05 DIAGNOSIS — I6522 Occlusion and stenosis of left carotid artery: Secondary | ICD-10-CM | POA: Diagnosis not present

## 2020-11-05 DIAGNOSIS — Z8673 Personal history of transient ischemic attack (TIA), and cerebral infarction without residual deficits: Secondary | ICD-10-CM

## 2020-11-05 DIAGNOSIS — E118 Type 2 diabetes mellitus with unspecified complications: Secondary | ICD-10-CM | POA: Diagnosis not present

## 2020-11-05 MED ORDER — HYDRALAZINE HCL 25 MG PO TABS: 25.0000 mg | ORAL_TABLET | Freq: Three times a day (TID) | ORAL | 3 refills | Status: DC

## 2020-11-05 NOTE — Progress Notes (Signed)
Cardiology Office Note:    Date:  11/05/2020   ID:  Chelsea, [name] [birth year], MRN [id number]  PCP:  Wendie Agreste, MD  Cardiologist:  Buford Dresser, MD  Referring MD: Wendie Agreste, MD   CC: post hospital follow up  History of Present Illness:    Chelsea [name] is a 75 y.o. female with a hx of CVA, cardiac arrest, carotid artery occlusion, diabetes mellitus without complication, hypertension, and stroke who is seen for follow up today. I initially met her 06/18/20 as a new consult at the request of Wendie Agreste, MD for the evaluation and management of uncontrolled hypertension. She was last seen by Frann Rider, NP 05/22/2020 for continued stroke symptom management.  Today: Here with her daughter who is her caretaker.  Seen for post hospital follow up. Discharged 10/10/20 after presenting with syncope. Noted to have elevated blood pressures prior to admission. On arrival, found to have UTI, hyponatremia, dehydration. Improved with hydration. HCTZ was discontinued.   Feeling better since coming home. Getting OT at home. Has been having highly variable BP. Yesterday was 110/52, remained low in the evening. Was very elevated at recent office visit, [id number] systolic.   We reviewed her current medication regimen: 8 AM: amlodipine 10 mg, carvedilol 3.125 mg, clopidogrel 75 mg, hydralazine 25 mg, lisinopril 20 mg, tamsulosin 0.4 mg every other day, iron 325 mg, torsemide 20 mg  Feels very tired about two hours later  Mid-day: hydralazine 25 mg daily  Around 5 PM: torsemide 20 mg  Evening (around 10 PM): hydralazine 25 mg, rosuvastatin 5 mg, carvedilol 3.125 mg  I recommend the following: 8 AM: amlodipine 10 mg, carvedilol 3.125 mg, clopidogrel 75 mg, hydralazine 25 mg, iron 325 mg, torsemide 20 mg  Mid-day: hydralazine 25 mg  I would take the PM torsemide dose only if there is a lot of swelling.  Evening: hydralazine 25 mg, lisinopril 20 mg, tamsulosin 0.4  mg every other day, rosuvastatin 5 mg, carvedilol 3.125 mg  I also recommend the following parameters: If blood pressure is <120/70, I would hold the next dose of hydralazine.  If one hour after taking medications the blood pressure is >170 on the top number, I would take an extra hydralazine 25 mg.  Due for blood work this week, followed by Dr. [name].  Swelling much less since starting torsemide.   Denies chest pain, shortness of breath at rest or with normal exertion. No PND, orthopnea, or unexpected weight gain. No additional syncope or palpitations.   Past Medical History:  Diagnosis Date   Allergy    Anemia    Blind left eye    Cardiac arrest [facility])    Carotid artery occlusion    Cataract    Depression    Diabetes mellitus without complication ([place])    Hyperlipidemia    Hypertension    Oxygen deficiency    Stroke [facility])     Past Surgical History:  Procedure Laterality Date   ENDARTERECTOMY Left 12/03/2019   Procedure: LEFT CAROTID ENDARTERECTOMY;  Surgeon: Angelia Mould, MD;  Location: [place];  Service: Vascular;  Laterality: Left;   PATCH ANGIOPLASTY Left 12/03/2019   Procedure: PATCH ANGIOPLASTY Left Carotid Artery;  Surgeon: Angelia Mould, MD;  Location: [facility] OR;  Service: Vascular;  Laterality: Left;    Current Medications: Current Outpatient Medications on File Prior to Visit  Medication Sig   acetaminophen (TYLENOL) 500 MG tablet Take 1,000 mg by mouth every 6 (six) hours  as needed for moderate pain.   amLODipine (NORVASC) 10 MG tablet Take 1 tablet (10 mg total) by mouth daily.   carvedilol (COREG) 3.125 MG tablet Take 1 tablet (3.125 mg total) by mouth 2 (two) times daily with a meal.   clopidogrel (PLAVIX) 75 MG tablet TAKE 1 TABLET ONCE DAILY   Elastic Bandages & Supports (MEDICAL COMPRESSION SOCKS) MISC 1 Units by Does not apply route daily.   Ferrous Sulfate (IRON) 325 (65 Fe) MG TABS Take 325 mg by mouth every other day.   guaiFENesin  (MUCINEX) 600 MG 12 hr tablet Take 1 tablet (600 mg total) by mouth 2 (two) times daily.   lisinopril (ZESTRIL) 20 MG tablet Take 1 tablet (20 mg total) by mouth daily.   rosuvastatin (CRESTOR) 5 MG tablet Take 5 mg every other day for 2 weeks, then increase to 5 mg daily. (Patient taking differently: Take 5 mg by mouth See admin instructions. Take 5 mg every other day for 2 weeks, then increase to 5 mg daily.)   tamsulosin (FLOMAX) 0.4 MG CAPS capsule Take 0.4 mg by mouth every other day.   torsemide (DEMADEX) 20 MG tablet Take 20 mg by mouth 2 (two) times daily.   No current facility-administered medications on file prior to visit.     Allergies:   Codeine, Latex, Penicillins, Sulfa antibiotics, Benzodiazepines, Lidocaine, Oxycodone-acetaminophen, Trazodone, Cephalexin, Gabapentin, Lipitor [atorvastatin], and Lyrica [pregabalin]   Social History   Tobacco Use   Smoking status: Former    Types: Cigarettes   Smokeless tobacco: Never  Vaping Use   Vaping Use: Never used  Substance Use Topics   Alcohol use: Not Currently   Drug use: Never    Family History: family history includes Arthritis in her brother, daughter, father, mother, and sister; COPD in her sister; Cancer in her mother; Depression in her brother, daughter, sister, and son; Diabetes in her brother, daughter, father, mother, sister, and son; Heart attack in her mother and sister; Heart disease in her brother and sister; Hyperlipidemia in her father and mother; Hypertension in her brother, daughter, father, mother, and sister; Stroke in her brother, daughter, and father.  ROS:   Please see the history of present illness.  Additional pertinent ROS otherwise unremarkable.   EKGs/Labs/Other Studies Reviewed:    The following studies were reviewed today: Echo 10/07/20 1. Peak LV outflow tract velocity 2.7 m/s, peak gradient 29 mmHg (no  significant obstruction). Left ventricular ejection fraction, by  estimation, is 70 to  75%. The left ventricle has hyperdynamic function.  The left ventricle has no regional wall motion  abnormalities. There is mild left ventricular hypertrophy. Left  ventricular diastolic parameters are consistent with Grade I diastolic  dysfunction (impaired relaxation).   2. Right ventricular systolic function is normal. The right ventricular  size is normal.   3. The mitral valve is normal in structure. No evidence of mitral valve  regurgitation. No evidence of mitral stenosis. Moderate mitral annular  calcification.   4. The aortic valve is tricuspid. Aortic valve regurgitation is not  visualized. Mild aortic valve sclerosis is present, with no evidence of  aortic valve stenosis.   5. The inferior vena cava is normal in size with greater than 50%  respiratory variability, suggesting right atrial pressure of 3 mmHg.   [place] LE Venous 12/12/2019: RIGHT:  - There is no evidence of deep vein thrombosis in the lower extremity.  - No cystic structure found in the popliteal fossa.  LEFT:  - No evidence of common femoral vein obstruction.  Echo 11/07/2019:  1. Left ventricular ejection fraction, by estimation, is 60 to 65%. The  left ventricle has normal function. The left ventricle has no regional  wall motion abnormalities. There is mild asymmetric left ventricular  hypertrophy of the basal-septal segment.  Left ventricular diastolic parameters are consistent with Grade I  diastolic dysfunction (impaired relaxation). Elevated left ventricular  end-diastolic pressure.   2. Right ventricular systolic function is normal. The right ventricular  size is normal.   3. Left atrial size was moderately dilated.   4. The mitral valve is normal in structure. Trivial mitral valve  regurgitation. No evidence of mitral stenosis. Moderate mitral annular  calcification.   5. The aortic valve is tricuspid. There is mild calcification of the  aortic valve. There is mild thickening of the aortic  valve. Aortic valve  regurgitation is not visualized. Mild aortic valve sclerosis is present,  with no evidence of aortic valve  stenosis.   6. The inferior vena cava is normal in size with greater than 50%  respiratory variability, suggesting right atrial pressure of 3 mmHg.   7. Agitated saline contrast bubble study was negative, with no evidence  of any interatrial shunt.   Comparison(s): No prior Echocardiogram.   Conclusion(s)/Recommendation(s): Normal biventricular function without  evidence of hemodynamically significant valvular heart disease. No  intracardiac source of embolism detected on this transthoracic study. A  transesophageal echocardiogram is  recommended to exclude cardiac source of embolism if clinically indicated.   US Carotid Duplex 11/02/2019: Summary:  Right Carotid: Velocities in the right ICA are consistent with a 1-39%  stenosis.   Left Carotid: Velocities in the left ICA are consistent with a 80-99%  stenosis.   Vertebrals:  Bilateral vertebral arteries demonstrate antegrade flow.  Subclavians: Normal flow hemodynamics were seen in bilateral subclavian arteries.   EKG:  EKG is personally reviewed.   06/18/2020: sinus bradycardia at 59 bpm, nonspecific t wave pattern  Recent Labs: 09/29/2020: ALT 8 10/08/2020: Hemoglobin 9.1; Platelets 254 10/09/2020: BUN 39; Creatinine, Ser 1.87; Magnesium 1.9; Potassium 4.4; Sodium 134  Recent Lipid Panel    Component Value Date/Time   CHOL 173 11/08/2019 0406   TRIG 82 11/08/2019 0406   HDL 36 (L) 11/08/2019 0406   CHOLHDL 4.8 11/08/2019 0406   VLDL 16 11/08/2019 0406   LDLCALC 121 (H) 11/08/2019 0406    Physical Exam:    VS:  BP (!) 147/54   Pulse (!) 59   Ht _0  (1.651 m)   Wt 138 lb 6.4 oz (62.8 kg)   SpO2 99%   BMI 23.03 kg/m     Wt Readings from Last 3 Encounters:  11/05/20 138 lb 6.4 oz (62.8 kg)  10/30/20 140 lb (63.5 kg)  10/06/20 147 lb 11.3 oz (67 kg)    GEN: Well nourished, well  developed in no acute distress HEENT: Normal, moist mucous membranes NECK: No JVD CARDIAC: regular rhythm, normal S1 and S2, no rubs or gallops. No significant murmur appreciated today VASCULAR: Radial and DP pulses 2+ bilaterally. +left carotid bruit RESPIRATORY:  Clear to auscultation without wheezing or rhonchi. Slight rales at right base ABDOMEN: Soft, non-tender, non-distended MUSCULOSKELETAL:  Moves all 4 limbs independently, in wheelchair SKIN: Warm and dry, no LE edema, compression stockings in place NEUROLOGIC:  Alert and oriented x 3. No focal neuro deficits noted. PSYCHIATRIC:  Normal affect    ASSESSMENT:    1. History of  CVA (cerebrovascular accident)   2. Essential hypertension   3. Carotid stenosis, left   4. Hyperlipidemia LDL goal <70   5. Type 2 diabetes mellitus with complication, without long-term current use of insulin (HCC)   6. Stage 3b chronic kidney disease (HCC)     PLAN:    History of CVA Carotid stenosis, left Hypercholesterolemia -continue clopidogrel -last LDL 121, goal <70 -tolerating rosuvastatin, had recent labs done, will have them sent to [place]  Hypertension, labile -we reviewed her regimen and timing at length, see above -difficult situation. She is at high risk for syncope, falls etc with hypotension. She can have very elevated blood pressures as well. We adjusted parts of her regimen today. Hydralazine is the easiest med to change if needed, gave parameters on when to hold/when to give extra  Type II diabetes Chronic kidney disease, stage 3b (GFR 39) Former smoker -no edema on torsemide. Given CKD, will cut to daily dose with PRN second daily dose for edema -With frequent UTIs, SGLT2i not ideal. Considered GLP1RA, though she has had issues with lack of appetite/weight loss, so would not start at this time. -given good control of sugars in the hospital, and renal disease, I would favor monitoring sugars and not starting meds unless they rise  significantly. A1c has been 5.9-6.4 recently.  Cardiac risk counseling and prevention recommendations: -recommend heart healthy/Mediterranean diet, with whole grains, fruits, vegetable, fish, lean meats, nuts, and olive oil. Limit salt. -limited mobility, cannot walk for significant distance -recommend avoidance of tobacco products. Avoid excess alcohol.  Plan for follow up: 6-8 weeks or sooner as needed.  Buford Dresser, MD, PhD, [place]    Total time of encounter: 56 minutes total time of encounter, including 34 minutes spent in face-to-face patient care. This time includes coordination of care and counseling regarding blood pressure and medications. Remainder of non-face-to-face time involved reviewing chart documents/testing relevant to the patient encounter and documentation in the medical record.  Buford Dresser, MD, PhD, [facility]    Medication Adjustments/Labs and Tests Ordered: Current medicines are reviewed at length with the patient today.  Concerns regarding medicines are outlined above.  No orders of the defined types were placed in this encounter.  Meds ordered this encounter  Medications   hydrALAZINE (APRESOLINE) 25 MG tablet    Sig: Take 1 tablet (25 mg total) by mouth 3 (three) times daily.    Dispense:  270 tablet    Refill:  3    Please wait to fill until they call for a refill     Patient Instructions  Medication Instructions:  I recommend the following: 8 AM: amlodipine 10 mg, carvedilol 3.125 mg, clopidogrel 75 mg, hydralazine 25 mg, iron 325 mg, torsemide 20 mg  Mid-day: hydralazine 25 mg  I would take the PM torsemide dose only if there is a lot of swelling.  Evening: hydralazine 25 mg, lisinopril 20 mg, tamsulosin 0.4 mg every other day, rosuvastatin 5 mg, carvedilol 3.125 mg  I also recommend the following parameters: If blood pressure is <120/70, I would hold the next dose of  hydralazine.  If one hour after taking medications the blood pressure is >170 on the top number, I would take an extra hydralazine 25 mg  *If you need a refill on your cardiac medications before your next appointment, please call your pharmacy*   Lab Work: None ordered today   Testing/Procedures: None ordered today   Follow-Up: At  [facility], you and your health needs are our priority.  As part of our continuing mission to provide you with exceptional heart care, we have created designated Provider Care Teams.  These Care Teams include your primary Cardiologist (physician) and Advanced Practice Providers (APPs -  Physician Assistants and Nurse Practitioners) who all work together to provide you with the care you need, when you need it.  We recommend signing up for the patient portal called "MyChart".  Sign up information is provided on this After Visit Summary.  MyChart is used to connect with patients for Virtual Visits (Telemedicine).  Patients are able to view lab/test results, encounter notes, upcoming appointments, etc.  Non-urgent messages can be sent to your provider as well.   To learn more about what you can do with MyChart, go to [email or web address].    Your next appointment:   6-8 week(s)  The format for your next appointment:   In Person  Provider:   Buford Dresser, MD     Signed, Buford Dresser, MD PhD 11/05/2020 12:14 PM    [place]

## 2020-11-05 NOTE — Patient Instructions (Signed)
Medication Instructions:  I recommend the following: 8 AM: amlodipine 10 mg, carvedilol 3.125 mg, clopidogrel 75 mg, hydralazine 25 mg, iron 325 mg, torsemide 20 mg  Mid-day: hydralazine 25 mg  I would take the PM torsemide dose only if there is a lot of swelling.  Evening: hydralazine 25 mg, lisinopril 20 mg, tamsulosin 0.4 mg every other day, rosuvastatin 5 mg, carvedilol 3.125 mg  I also recommend the following parameters: If blood pressure is <120/70, I would hold the next dose of hydralazine.  If one hour after taking medications the blood pressure is >170 on the top number, I would take an extra hydralazine 25 mg  *If you need a refill on your cardiac medications before your next appointment, please call your pharmacy*   Lab Work: None ordered today   Testing/Procedures: None ordered today   Follow-Up: At [facility], you and your health needs are our priority.  As part of our continuing mission to provide you with exceptional heart care, we have created designated Provider Care Teams.  These Care Teams include your primary Cardiologist (physician) and Advanced Practice Providers (APPs -  Physician Assistants and Nurse Practitioners) who all work together to provide you with the care you need, when you need it.  We recommend signing up for the patient portal called "MyChart".  Sign up information is provided on this After Visit Summary.  MyChart is used to connect with patients for Virtual Visits (Telemedicine).  Patients are able to view lab/test results, encounter notes, upcoming appointments, etc.  Non-urgent messages can be sent to your provider as well.   To learn more about what you can do with MyChart, go to [email or web address].    Your next appointment:   6-8 week(s)  The format for your next appointment:   In Person  Provider:   Jodelle Red, MD

## 2020-11-06 ENCOUNTER — Other Ambulatory Visit (HOSPITAL_BASED_OUTPATIENT_CLINIC_OR_DEPARTMENT_OTHER): Payer: Self-pay | Admitting: Family

## 2020-11-06 DIAGNOSIS — I129 Hypertensive chronic kidney disease with stage 1 through stage 4 chronic kidney disease, or unspecified chronic kidney disease: Secondary | ICD-10-CM

## 2020-11-06 NOTE — Telephone Encounter (Signed)
[facility] called again about discontinuing oxygen - please advise.  Please fax to [phone] - Attention:  [facility]

## 2020-11-07 ENCOUNTER — Telehealth (HOSPITAL_BASED_OUTPATIENT_CLINIC_OR_DEPARTMENT_OTHER): Payer: Self-pay | Admitting: Family

## 2020-11-07 ENCOUNTER — Ambulatory Visit: Payer: PPO | Admitting: Family Medicine

## 2020-11-07 MED ORDER — LISINOPRIL 20 MG PO TABS: 20.0000 mg | ORAL_TABLET | Freq: Every day | ORAL | 1 refills | Status: DC

## 2020-11-07 NOTE — Telephone Encounter (Signed)
Lisinopril 20 mg sent to [facility].

## 2020-11-07 NOTE — Telephone Encounter (Signed)
*  STAT* If patient is at the pharmacy, call can be transferred to refill team.   1. Which medications need to be refilled? (please list name of each medication and dose if known) new prescription for Lisinopril  2. Which pharmacy/location (including street and city if local pharmacy) is medication to be sent to? [facility]  3. Do they need a 30 day or 90 day supply? 90 days and refills

## 2020-11-10 ENCOUNTER — Telehealth: Payer: Self-pay

## 2020-11-10 NOTE — Telephone Encounter (Signed)
Error

## 2020-11-11 ENCOUNTER — Telehealth: Payer: Self-pay

## 2020-11-11 ENCOUNTER — Encounter (HOSPITAL_BASED_OUTPATIENT_CLINIC_OR_DEPARTMENT_OTHER): Payer: Self-pay

## 2020-11-11 DIAGNOSIS — Z87891 Personal history of nicotine dependence: Secondary | ICD-10-CM | POA: Diagnosis not present

## 2020-11-11 DIAGNOSIS — N183 Chronic kidney disease, stage 3 unspecified: Secondary | ICD-10-CM | POA: Diagnosis not present

## 2020-11-11 DIAGNOSIS — Z9181 History of falling: Secondary | ICD-10-CM | POA: Diagnosis not present

## 2020-11-11 DIAGNOSIS — N179 Acute kidney failure, unspecified: Secondary | ICD-10-CM | POA: Diagnosis not present

## 2020-11-11 DIAGNOSIS — F32A Depression, unspecified: Secondary | ICD-10-CM | POA: Diagnosis not present

## 2020-11-11 DIAGNOSIS — E1136 Type 2 diabetes mellitus with diabetic cataract: Secondary | ICD-10-CM | POA: Diagnosis not present

## 2020-11-11 DIAGNOSIS — I1310 Hypertensive heart and chronic kidney disease without heart failure, with stage 1 through stage 4 chronic kidney disease, or unspecified chronic kidney disease: Secondary | ICD-10-CM | POA: Diagnosis not present

## 2020-11-11 DIAGNOSIS — E1122 Type 2 diabetes mellitus with diabetic chronic kidney disease: Secondary | ICD-10-CM | POA: Diagnosis not present

## 2020-11-11 DIAGNOSIS — N390 Urinary tract infection, site not specified: Secondary | ICD-10-CM | POA: Diagnosis not present

## 2020-11-11 DIAGNOSIS — I69351 Hemiplegia and hemiparesis following cerebral infarction affecting right dominant side: Secondary | ICD-10-CM | POA: Diagnosis not present

## 2020-11-11 DIAGNOSIS — E7800 Pure hypercholesterolemia, unspecified: Secondary | ICD-10-CM | POA: Diagnosis not present

## 2020-11-11 DIAGNOSIS — G3184 Mild cognitive impairment, so stated: Secondary | ICD-10-CM | POA: Diagnosis not present

## 2020-11-11 DIAGNOSIS — H5462 Unqualified visual loss, left eye, normal vision right eye: Secondary | ICD-10-CM | POA: Diagnosis not present

## 2020-11-11 DIAGNOSIS — Z7902 Long term (current) use of antithrombotics/antiplatelets: Secondary | ICD-10-CM | POA: Diagnosis not present

## 2020-11-11 DIAGNOSIS — E871 Hypo-osmolality and hyponatremia: Secondary | ICD-10-CM | POA: Diagnosis not present

## 2020-11-11 DIAGNOSIS — D631 Anemia in chronic kidney disease: Secondary | ICD-10-CM | POA: Diagnosis not present

## 2020-11-11 NOTE — Telephone Encounter (Signed)
Called to cancel. Heidi will check to see if verbal order ok, if not will let us know and I can fax Rx.

## 2020-11-11 NOTE — Telephone Encounter (Signed)
Call from [facility] stating she has tried to deliver a DC Oxygen verbal order to [facility] but [employer] does not accept verbal orders and has requested form be faxed to [phone].   Caller stated you had offered for [place] to fax a form, was there a specific order that needed to be placed or Rx pad with signature? Please advise and I can return message to [employer]

## 2020-11-14 ENCOUNTER — Telehealth: Payer: Self-pay

## 2020-11-14 NOTE — Telephone Encounter (Signed)
Caller name: [facility]   On DPR? :Yes    Call back [phone]  Provider they see: Neva Seat  Reason for call:Need Verbal orders for 1x week for 4 weeks Occupational Therapy

## 2020-11-17 NOTE — Telephone Encounter (Signed)
Dr Ave Filter from [facility] called and needs verbal order for 1x week for 4 weeks for occupational therapy.

## 2020-11-18 NOTE — Telephone Encounter (Signed)
Verbal orders given  

## 2020-11-19 NOTE — Telephone Encounter (Signed)
Done - Rx in your file.

## 2020-11-19 NOTE — Telephone Encounter (Signed)
Did you ever write this order on Rx sheet? I can still fax accordingly

## 2020-11-20 ENCOUNTER — Other Ambulatory Visit: Payer: Self-pay

## 2020-11-20 ENCOUNTER — Telehealth: Payer: Self-pay | Admitting: Family Medicine

## 2020-11-20 ENCOUNTER — Emergency Department (HOSPITAL_COMMUNITY)
Admission: EM | Admit: 2020-11-20 | Discharge: 2020-11-20 | Disposition: A | Discharge status: Home / Self Care | Payer: PPO | Attending: Emergency Medicine | Admitting: Emergency Medicine

## 2020-11-20 ENCOUNTER — Emergency Department (HOSPITAL_COMMUNITY): Payer: PPO

## 2020-11-20 DIAGNOSIS — Z79899 Other long term (current) drug therapy: Secondary | ICD-10-CM | POA: Insufficient documentation

## 2020-11-20 DIAGNOSIS — R42 Dizziness and giddiness: Secondary | ICD-10-CM | POA: Insufficient documentation

## 2020-11-20 DIAGNOSIS — Z20822 Contact with and (suspected) exposure to covid-19: Secondary | ICD-10-CM | POA: Insufficient documentation

## 2020-11-20 DIAGNOSIS — Z7902 Long term (current) use of antithrombotics/antiplatelets: Secondary | ICD-10-CM | POA: Insufficient documentation

## 2020-11-20 DIAGNOSIS — D631 Anemia in chronic kidney disease: Secondary | ICD-10-CM | POA: Insufficient documentation

## 2020-11-20 DIAGNOSIS — I959 Hypotension, unspecified: Secondary | ICD-10-CM | POA: Diagnosis not present

## 2020-11-20 DIAGNOSIS — Z91040 Latex allergy status: Secondary | ICD-10-CM | POA: Diagnosis not present

## 2020-11-20 DIAGNOSIS — Z87891 Personal history of nicotine dependence: Secondary | ICD-10-CM | POA: Insufficient documentation

## 2020-11-20 DIAGNOSIS — M25511 Pain in right shoulder: Secondary | ICD-10-CM | POA: Insufficient documentation

## 2020-11-20 DIAGNOSIS — R55 Syncope and collapse: Secondary | ICD-10-CM | POA: Insufficient documentation

## 2020-11-20 DIAGNOSIS — R5383 Other fatigue: Secondary | ICD-10-CM | POA: Diagnosis not present

## 2020-11-20 DIAGNOSIS — R61 Generalized hyperhidrosis: Secondary | ICD-10-CM | POA: Diagnosis not present

## 2020-11-20 DIAGNOSIS — E1122 Type 2 diabetes mellitus with diabetic chronic kidney disease: Secondary | ICD-10-CM | POA: Diagnosis not present

## 2020-11-20 DIAGNOSIS — N1832 Chronic kidney disease, stage 3b: Secondary | ICD-10-CM | POA: Diagnosis not present

## 2020-11-20 DIAGNOSIS — I129 Hypertensive chronic kidney disease with stage 1 through stage 4 chronic kidney disease, or unspecified chronic kidney disease: Secondary | ICD-10-CM | POA: Diagnosis not present

## 2020-11-20 LAB — URINALYSIS, ROUTINE W REFLEX MICROSCOPIC
Bilirubin Urine: NEGATIVE
Glucose, UA: 50 mg/dL — AB
Hgb urine dipstick: NEGATIVE
Ketones, ur: NEGATIVE mg/dL
Leukocytes,Ua: NEGATIVE
Nitrite: NEGATIVE
Protein, ur: NEGATIVE mg/dL
Specific Gravity, Urine: 1.006 (ref 1.005–1.030)
pH: 6 (ref 5.0–8.0)

## 2020-11-20 LAB — CBC
HCT: 30.3 % — ABNORMAL LOW (ref 36.0–46.0)
Hemoglobin: 10.2 g/dL — ABNORMAL LOW (ref 12.0–15.0)
MCH: 31.9 pg (ref 26.0–34.0)
MCHC: 33.7 g/dL (ref 30.0–36.0)
MCV: 94.7 fL (ref 80.0–100.0)
Platelets: 229 10*3/uL (ref 150–400)
RBC: 3.2 MIL/uL — ABNORMAL LOW (ref 3.87–5.11)
RDW: 13.2 % (ref 11.5–15.5)
WBC: 8.6 10*3/uL (ref 4.0–10.5)
nRBC: 0 % (ref 0.0–0.2)

## 2020-11-20 LAB — TROPONIN I (HIGH SENSITIVITY)
Troponin I (High Sensitivity): 4 ng/L (ref <18)
Troponin I (High Sensitivity): 3 ng/L (ref <18)

## 2020-11-20 LAB — RESP PANEL BY RT-PCR (FLU A&B, COVID) ARPGX2
Influenza A by PCR: NEGATIVE
Influenza B by PCR: NEGATIVE
SARS Coronavirus 2 by RT PCR: NEGATIVE

## 2020-11-20 LAB — BASIC METABOLIC PANEL
Anion gap: 7 (ref 5–15)
BUN: 48 mg/dL — ABNORMAL HIGH (ref 8–23)
CO2: 28 mmol/L (ref 22–32)
Calcium: 8.5 mg/dL — ABNORMAL LOW (ref 8.9–10.3)
Chloride: 102 mmol/L (ref 98–111)
Creatinine, Ser: 2.17 mg/dL — ABNORMAL HIGH (ref 0.44–1.00)
GFR, Estimated: 23 mL/min — ABNORMAL LOW (ref >60)
Glucose, Bld: 212 mg/dL — ABNORMAL HIGH (ref 70–99)
Potassium: 4.2 mmol/L (ref 3.5–5.1)
Sodium: 137 mmol/L (ref 135–145)

## 2020-11-20 MED ORDER — SODIUM CHLORIDE 0.9 % IV BOLUS
500.0000 mL | Freq: Once | INTRAVENOUS | Status: AC
  Administered 2020-11-20: 500 mL via INTRAVENOUS

## 2020-11-20 NOTE — Telephone Encounter (Signed)
..  Home Health Certification or Plan of Care Tracking  Is this a Certification or Plan of Care?yes [id number]  [facility] Agency:  [facility] Order Number:  [id number]  Has charge sheet been attached? yes  Where has form been placed:   In Dr. Paralee Cancel bin up front

## 2020-11-20 NOTE — ED Triage Notes (Signed)
Pt brought in by ems for lightheadedness and hypotension. Pt states symptoms started around 1330 today while out to lunch. Pt states all home meds taken today as prescribed.

## 2020-11-20 NOTE — ED Notes (Signed)
PureWick placed on patient. 

## 2020-11-20 NOTE — ED Provider Notes (Signed)
[facility] DEPT Provider Note   CSN: [id number] Arrival date & time: 11/20/20  1446     History Chief Complaint  Patient presents with   Hypotension   Dizziness    Chelsea [name] is a 75 y.o. female.  This is a 75 y.o. female with significant medical history as below, including anemia, dm, hld, htn, cva, mi who presents to the ED with complaint of transient episode of diaphoresis, shoulder pain, generalized fatigue.   Location: Anterior right shoulder Duration: 20 minutes Onset: Sudden while out to lunch Timing: Resolved Description:  aching Severity: Mild Exacerbating/Alleviating Factors: None identified Associated Symptoms: Diaphoresis, generalized fatigue, reports that she "felt tired" Pertinent Negatives: No fevers, chills, nausea, vomiting, chest pain, dyspnea.    Context: Normal state of health prior to onset of symptoms.  Patient ports this occurred approximately 1 month ago and was secondary to electrolyte abnormalities. Family at bedside reports when EMS evaluate patient's blood pressure on arrival systolic pressure was around 80.  She reports compliance with home antihypertensives. No change to po or bowel/bladder fxn. No dizziness reported, just a sensation of feeling very fatigued transiently.     The history is provided by the patient and a relative. No language interpreter was used.  Dizziness Associated symptoms: no headaches, no nausea, no palpitations, no shortness of breath and no vomiting       Past Medical History:  Diagnosis Date   Allergy    Anemia    Blind left eye    Cardiac arrest ([place])    Carotid artery occlusion    Cataract    Depression    Diabetes mellitus without complication ([place])    Hyperlipidemia    Hypertension    Oxygen deficiency    Stroke [facility])     Patient Active Problem List   Diagnosis Date Noted   Hyponatremia 10/07/2020   AKI (acute kidney injury) ([place]) 10/07/2020   Acute kidney injury  superimposed on CKD ([place]) 10/07/2020   Dehydration with hyponatremia    Syncope 10/06/2020   History of CVA (cerebrovascular accident) 07/10/2020   Pure hypercholesterolemia 07/10/2020   Type 2 diabetes mellitus with complication, without long-term current use of insulin ([place]) 07/10/2020   Malnutrition of moderate degree 06/27/2020   Stage 3b chronic kidney disease ([place]) 06/25/2020   CAP (community acquired pneumonia) 06/25/2020   Stenosis of left carotid artery 12/03/2019   Stroke [facility])    Pain    Acute ischemic right posterior cerebral artery (PCA) stroke ([place]) 11/09/2019   Essential hypertension    Controlled type 2 diabetes mellitus with hyperglycemia (H[facility])    Postherpetic neuralgia    Uncontrolled type 2 diabetes mellitus with hyperglycemia, without long-term current use of insulin ([place]) 11/07/2019   Hypertensive urgency 11/07/2019   Stenosis of left internal carotid artery 11/07/2019   Intractable nausea and vomiting 11/07/2019   Nicotine dependence, cigarettes, uncomplicated [id number]   HZV (herpes zoster virus) post herpetic neuralgia 11/07/2019   Weakness of right lower extremity 11/07/2019   CVA (cerebral vascular accident) ([place]) 11/07/2019    Past Surgical History:  Procedure Laterality Date   ENDARTERECTOMY Left 12/03/2019   Procedure: LEFT CAROTID ENDARTERECTOMY;  Surgeon: Angelia Mould, MD;  Location: [facility];  Service: Vascular;  Laterality: Left;   PATCH ANGIOPLASTY Left 12/03/2019   Procedure: PATCH ANGIOPLASTY Left Carotid Artery;  Surgeon: Angelia Mould, MD;  Location: [facility] OR;  Service: Vascular;  Laterality: Left;     OB History  Gravida  2   Para  2   Term      Preterm      AB      Living         SAB      IAB      Ectopic      Multiple      Live Births              Family History  Problem Relation Age of Onset   Hypertension Mother    Hyperlipidemia Mother    Diabetes Mother    Cancer Mother    Arthritis  Mother    Heart attack Mother    Hypertension Father    Hyperlipidemia Father    Diabetes Father    Arthritis Father    Stroke Father    Hypertension Sister    Heart disease Sister    Heart attack Sister    Diabetes Sister    Depression Sister    COPD Sister    Arthritis Sister    Stroke Brother    Hypertension Brother    Heart disease Brother    Diabetes Brother    Depression Brother    Arthritis Brother    Stroke Daughter    Hypertension Daughter    Diabetes Daughter    Depression Daughter    Arthritis Daughter    Diabetes Son    Depression Son     Social History   Tobacco Use   Smoking status: Former    Types: Cigarettes   Smokeless tobacco: Never  Vaping Use   Vaping Use: Never used  Substance Use Topics   Alcohol use: Not Currently   Drug use: Never    Home Medications Prior to Admission medications   Medication Sig Start Date End Date Taking? Authorizing Provider  acetaminophen (TYLENOL) 500 MG tablet Take 1,000 mg by mouth every 6 (six) hours as needed for moderate pain.   Yes [provider]  amLODipine (NORVASC) 10 MG tablet Take 1 tablet (10 mg total) by mouth daily. 09/12/20  Yes Loel Dubonnet, NP  carvedilol (COREG) 3.125 MG tablet Take 1 tablet (3.125 mg total) by mouth 2 (two) times daily with a meal. 06/18/20  Yes Buford Dresser, MD  clopidogrel (PLAVIX) 75 MG tablet TAKE 1 TABLET ONCE DAILY 10/20/20  Yes Buford Dresser, MD  Ferrous Sulfate (IRON) 325 (65 Fe) MG TABS Take 325 mg by mouth every other day.   Yes [provider]  furosemide (LASIX) 20 MG tablet Take 20 mg by mouth daily as needed for fluid or edema. 10/10/20  Yes [provider]  hydrALAZINE (APRESOLINE) 25 MG tablet Take 1 tablet (25 mg total) by mouth 3 (three) times daily. 11/05/20 10/31/21 Yes Buford Dresser, MD  lisinopril (ZESTRIL) 20 MG tablet Take 1 tablet (20 mg total) by mouth daily. 11/07/20  Yes Buford Dresser, MD   rosuvastatin (CRESTOR) 5 MG tablet Take 5 mg every other day for 2 weeks, then increase to 5 mg daily. Patient taking differently: Take 5 mg by mouth daily. 06/18/20  Yes Buford Dresser, MD  tamsulosin (FLOMAX) 0.4 MG CAPS capsule Take 0.4 mg by mouth every evening. 07/07/20  Yes [provider]  torsemide (DEMADEX) 20 MG tablet Take 20 mg by mouth 2 (two) times daily. 10/23/20  Yes [provider]  Elastic Bandages & Supports (MEDICAL COMPRESSION SOCKS) MISC 1 Units by Does not apply route daily. 09/12/20   Loel Dubonnet, NP  guaiFENesin (  MUCINEX) 600 MG 12 hr tablet Take 1 tablet (600 mg total) by mouth 2 (two) times daily. Patient not taking: Reported on 11/20/2020 10/10/20   Florencia Reasons, MD    Allergies    Codeine, Latex, Penicillins, Sulfa antibiotics, Benzodiazepines, Lidocaine, Oxycodone-acetaminophen, Trazodone, Cephalexin, Gabapentin, Lipitor [atorvastatin], and Lyrica [pregabalin]  Review of Systems   Review of Systems  Constitutional:  Positive for diaphoresis and fatigue. Negative for chills and fever.  HENT:  Negative for facial swelling and trouble swallowing.   Eyes:  Negative for photophobia and visual disturbance.  Respiratory:  Negative for cough and shortness of breath.   Cardiovascular:  Negative for palpitations.  Gastrointestinal:  Negative for abdominal pain, nausea and vomiting.  Endocrine: Negative for polydipsia and polyuria.  Genitourinary:  Negative for difficulty urinating and hematuria.  Musculoskeletal:  Positive for arthralgias. Negative for gait problem and joint swelling.  Skin:  Negative for pallor and rash.  Neurological:  Negative for dizziness, tremors, syncope, numbness and headaches.  Psychiatric/Behavioral:  Negative for agitation and confusion.    Physical Exam Updated Vital Signs BP (!) 167/66 (BP Location: Left Arm)   Pulse 63   Temp 97.6 F (36.4 C) (Oral)   Resp 19   Ht 5\' 5"  (1.651 m)   Wt 62.6 kg   SpO2 99%    BMI 22.96 kg/m   Physical Exam Vitals and nursing note reviewed.  Constitutional:      General: She is not in acute distress.    Appearance: Normal appearance.  HENT:     Head: Normocephalic and atraumatic.     Right Ear: External ear normal.     Left Ear: External ear normal.     Nose: Nose normal.     Mouth/Throat:     Mouth: Mucous membranes are moist.  Eyes:     General: No scleral icterus.       Right eye: No discharge.        Left eye: No discharge.     Extraocular Movements: Extraocular movements intact.     Pupils: Pupils are equal, round, and reactive to light.  Cardiovascular:     Rate and Rhythm: Normal rate and regular rhythm.     Pulses: Normal pulses.     Heart sounds: Normal heart sounds.  Pulmonary:     Effort: Pulmonary effort is normal. No respiratory distress.     Breath sounds: Normal breath sounds.  Abdominal:     General: Abdomen is flat.     Tenderness: There is no abdominal tenderness.  Musculoskeletal:        General: Normal range of motion.     Cervical back: Normal range of motion.     Right lower leg: No edema.     Left lower leg: No edema.  Skin:    General: Skin is warm and dry.     Capillary Refill: Capillary refill takes less than 2 seconds.  Neurological:     Mental Status: She is alert and oriented to person, place, and time.     GCS: GCS eye subscore is 4. GCS verbal subscore is 5. GCS motor subscore is 6.     Cranial Nerves: Cranial nerves 2-12 are intact. No dysarthria or facial asymmetry.     Sensory: Sensation is intact.     Motor: Motor function is intact. No tremor.     Coordination: Coordination is intact. Finger-Nose-Finger Test normal.     Gait: Gait is intact.  Psychiatric:  Mood and Affect: Mood normal.        Behavior: Behavior normal.    ED Results / Procedures / Treatments   Labs (all labs ordered are listed, but only abnormal results are displayed) Labs Reviewed  BASIC METABOLIC PANEL - Abnormal; Notable  for the following components:      Result Value   Glucose, Bld 212 (*)    BUN 48 (*)    Creatinine, Ser 2.17 (*)    Calcium 8.5 (*)    GFR, Estimated 23 (*)    All other components within normal limits  CBC - Abnormal; Notable for the following components:   RBC 3.20 (*)    Hemoglobin 10.2 (*)    HCT 30.3 (*)    All other components within normal limits  URINALYSIS, ROUTINE W REFLEX MICROSCOPIC - Abnormal; Notable for the following components:   Color, Urine STRAW (*)    Glucose, UA 50 (*)    All other components within normal limits  RESP PANEL BY RT-PCR (FLU A&B, COVID) [id number]  TROPONIN I (HIGH SENSITIVITY)  TROPONIN I (HIGH SENSITIVITY)    EKG EKG Interpretation  Date/Time:  Thursday November 20 2020 15:22:59 EST Ventricular Rate:  68 PR Interval:  212 QRS Duration: 109 QT Interval:  443 QTC Calculation: 472 R Axis:   -32 Text Interpretation: Sinus rhythm Borderline prolonged PR interval Left ventricular hypertrophy Similar to prior tracing 9/22 Confirmed by Tanda Rockers ([id number]) on 11/20/2020 5:36:00 PM  Radiology DG Chest 2 View  Result Date: 11/20/2020 CLINICAL DATA:  Diaphoresis.  Hypotension EXAM: CHEST - 2 VIEW COMPARISON:  10/06/2020 FINDINGS: The heart size and mediastinal contours are within normal limits. Both lungs are clear. The visualized skeletal structures are unremarkable. IMPRESSION: No active cardiopulmonary disease. Electronically Signed   By: Marlan Palau M.D.   On: 11/20/2020 16:42    Procedures Procedures   Medications Ordered in ED Medications  sodium chloride 0.9 % bolus 500 mL (0 mLs Intravenous Stopped 11/20/20 2155)    ED Course  I have reviewed the triage vital signs and the nursing notes.  Pertinent labs & imaging results that were available during my care of the patient were reviewed by me and considered in my medical decision making (see chart for details).    MDM Rules/Calculators/A&P                           CC: light  headed, diaphoretic  This patient complains of above; this involves an extensive number of treatment options and is a complaint that carries with it a high risk of complications and morbidity. Vital signs were reviewed. Serious etiologies considered.  Record review:   Previous records obtained and reviewed   Additional history obtained from family  Work up as above, notable for:  Labs & imaging results that were available during my care of the patient were reviewed by me and considered in my medical decision making.   I ordered imaging studies which included chest x-ray and I independently visualized and interpreted imaging which showed no acute process  EKG with evidence acute ischemia, chest x-ray needed, no ongoing chest pain, troponin negative.  ACS is unlikely.  Patient attributes her shoulder pain to prior episode of shingles  Laboratory evaluation otherwise comparable to her baseline values.  Creatinine is minimally worse from baseline, she is tolerating oral fluids.   Reassessment:  Patient reports that she is feeling back to her baseline. She is ambulatory  and tolerating oral intake. Favor likely vasovagal episode as etiology of complaints today. Advised her to follow up with her cardiologist as an outpatient and with her pcp.  Bp stable in the ED, pt ambulatory without difficulty, tolerating PO.   The patient improved significantly and was discharged in stable condition. Detailed discussions were had with the patient regarding current findings, and need for close f/u with PCP or on call doctor. The patient has been instructed to return immediately if the symptoms worsen in any way for re-evaluation. Patient verbalized understanding and is in agreement with current care plan. All questions answered prior to discharge.           This chart was dictated using voice recognition software.  Despite best efforts to proofread,  errors can occur which can change the  documentation meaning.  Final Clinical Impression(s) / ED Diagnoses Final diagnoses:  Vasovagal near syncope    Rx / DC Orders ED Discharge Orders     None        Jeanell Sparrow, DO 11/21/20 0127

## 2020-11-21 ENCOUNTER — Encounter: Payer: PPO | Admitting: Cardiology

## 2020-11-24 ENCOUNTER — Telehealth: Payer: Self-pay | Admitting: Family Medicine

## 2020-11-24 ENCOUNTER — Encounter: Payer: Self-pay | Admitting: Adult Health

## 2020-11-24 ENCOUNTER — Ambulatory Visit: Payer: PPO | Admitting: Adult Health

## 2020-11-24 VITALS — BP 163/69 | HR 57 | Ht 66.0 in | Wt 137.0 lb

## 2020-11-24 DIAGNOSIS — I10 Essential (primary) hypertension: Secondary | ICD-10-CM

## 2020-11-24 DIAGNOSIS — E785 Hyperlipidemia, unspecified: Secondary | ICD-10-CM

## 2020-11-24 DIAGNOSIS — Z8673 Personal history of transient ischemic attack (TIA), and cerebral infarction without residual deficits: Secondary | ICD-10-CM | POA: Diagnosis not present

## 2020-11-24 NOTE — Progress Notes (Signed)
[facility] [address] [place].  [id number] [phone]       OFFICE FOLLOW UP NOTE  Chelsea [name] Date of Birth:  [birth year] Medical Record Number:  [id number]   Referring MD: Dr. Antonietta [name]   Reason for Referral: Stroke   Chief Complaint  Patient presents with   Follow-up    RM 3 with daughter  Chelsea [name]  Pt is well and stable, no new concerns      HPI:   Update 11/24/2020 JM: Patient presents for stroke follow-up after prior visit 6 months ago accompanied by daughter.  Patient is still using walker for right-sided weakness and remains unable to cook or perform ADLs independently. Vision remains blurred. She is continuing physical/occupational therapy once a week. Denies falls. Continuing Plavix and Crestor without side effects.  Routinely follows with [facility] for carotid stenosis. Is seeing Chelsea Red, MD with cardiology for hypertension management and recurrent syncopal events with f/u scheduled 11/17. Presented to ED 4 days ago for lightheadedness and hypotension, deemed vasovagal. Bps remain labile - routinely monitored at home typically SBP 140s but largely fluctuate . She has a cardiology appointment in 3 days.      History provided for reference purposes only  Update 05/22/2020 JM: Chelsea [name] returns for stroke follow-up accompanied by her daughter after prior visit 3 months ago with Chelsea [name].  She has been doing well since prior visit recently started working with PT for gait and balance impairment. She continues to use rolling walker and denies any recent falls.  Continues to have mild right sided weakness since her stroke with some improvement since prior visit.  Denies new stroke/TIA symptoms.  Compliant on Plavix without associated side effects Self discontinued atorvastatin due to continued nausea and vomiting which subsided after stopping.  PCP recently started omega-3 but she has been tolerating without side effects. Blood  pressure today 160/78 - monitors at home and has been uncontrolled with PCP managing  Recent LDL 87; recent A1c 6.2  Daughter is frustrated regarding continued difficulty controlling blood pressure as well as recurrent UTIs. Denies being referred to any specialty such as cardiology or urology  No further concerns at this time   Consult visit 02/21/2020 Chelsea [name]: [name] is a 38 Caucasian lady seen today for initial office consultation visit for stroke.  She is accompanied by her daughter Chelsea [name].  History is obtained from the patient, review of electronic medical records and I personally reviewed pertinent imaging films and PACS.  She has past medical history of diabetes, hypertension, cardiac arrest who presented to [facility] emergency room on 11/07/2019 with confusion and she was a poor historian upon arrival.  Her blood pressure was found to be significantly elevated to 1 6/79.  She fell 1 week prior that she had lost vision in the left eye.  She had an MRI scan done after arrival which showed a right PCA infarct most involving the right hippocampus.  CT angiogram showed 80% focal left proximal internal carotid artery stenosis.  Left vertebral artery was occluded at the origin.  There was diffuse intracranial atherosclerotic changes involving both posterior cerebral arteries.  She was seen by vascular surgery and underwent elective left carotid endarterectomy in 12/03/2019 by Chelsea [name].  She was placed on aspirin Plavix due to intracranial atherosclerosis.  Her LDL cholesterol is 121 she started on Lipitor 80.  Hemoglobin A1c was significantly elevated at 10.1.  Patient states that she continues to have persistent left-sided vision difficulties  however is she has been quite sick with recurrent bouts of urinary tract infections as well as severe nausea as well as alternating constipation and diarrhea.  She has been on antibiotics several courses.  She is quite frustrated with the primary care physician  and has recently changed to a new primary care physician whom she has not seen yet.  She is barely able to walk with a walker and requires constant help from her daughter and she is now moved in to live with the daughter.  She still has poor balance and poor vision weakness is generalized.  She remains on aspirin and Plavix and denies significant bruising or bleeding.  She is also complaining of increased resurgence of her shingles pain in the left arm.  She had good response with Lyrica in the past but she had stopped it for few months and when she took 1 tablet of 100 mg she could not tolerated and hence she is not taking it since.  ROS:   14 system review of systems is positive for those listed in HPI and all other systems negative  PMH:  Past Medical History:  Diagnosis Date   Allergy    Anemia    Blind left eye    Cardiac arrest (HCC)    Carotid artery occlusion    Cataract    Depression    Diabetes mellitus without complication (HCC)    Hyperlipidemia    Hypertension    Oxygen deficiency    Stroke [facility])     Social History:  Social History   Socioeconomic History   Marital status: Divorced    Spouse name: Not on file   Number of children: Not on file   Years of education: Not on file   Highest education level: Not on file  Occupational History   Not on file  Tobacco Use   Smoking status: Former    Types: Cigarettes   Smokeless tobacco: Never  Vaping Use   Vaping Use: Never used  Substance and Sexual Activity   Alcohol use: Not Currently   Drug use: Never   Sexual activity: Not Currently  Other Topics Concern   Not on file  Social History Narrative   Lives with daughter, Chelsea [name]   Right Handed   Drinks no caffeine   Social Determinants of Health   Financial Resource Strain: Not on file  Food Insecurity: Not on file  Transportation Needs: Not on file  Physical Activity: Not on file  Stress: Not on file  Social Connections: Not on file  Intimate Partner  Violence: Not on file    Medications:   Current Outpatient Medications on File Prior to Visit  Medication Sig Dispense Refill   acetaminophen (TYLENOL) 500 MG tablet Take 1,000 mg by mouth every 6 (six) hours as needed for moderate pain.     amLODipine (NORVASC) 10 MG tablet Take 1 tablet (10 mg total) by mouth daily. 30 tablet 5   carvedilol (COREG) 3.125 MG tablet Take 1 tablet (3.125 mg total) by mouth 2 (two) times daily with a meal. 180 tablet 3   clopidogrel (PLAVIX) 75 MG tablet TAKE 1 TABLET ONCE DAILY 90 tablet 0   Elastic Bandages & Supports (MEDICAL COMPRESSION SOCKS) MISC 1 Units by Does not apply route daily. 1 each 0   Ferrous Sulfate (IRON) 325 (65 Fe) MG TABS Take 325 mg by mouth every other day.     furosemide (LASIX) 20 MG tablet Take 20 mg by mouth daily as  needed for fluid or edema.     guaiFENesin (MUCINEX) 600 MG 12 hr tablet Take 1 tablet (600 mg total) by mouth 2 (two) times daily. 14 tablet 0   hydrALAZINE (APRESOLINE) 25 MG tablet Take 1 tablet (25 mg total) by mouth 3 (three) times daily. 270 tablet 3   lisinopril (ZESTRIL) 20 MG tablet Take 1 tablet (20 mg total) by mouth daily. 90 tablet 1   rosuvastatin (CRESTOR) 5 MG tablet Take 5 mg every other day for 2 weeks, then increase to 5 mg daily. (Patient taking differently: Take 5 mg by mouth daily.) 90 tablet 3   tamsulosin (FLOMAX) 0.4 MG CAPS capsule Take 0.4 mg by mouth every evening.     torsemide (DEMADEX) 20 MG tablet Take 20 mg by mouth 2 (two) times daily.     No current facility-administered medications on file prior to visit.    Allergies:   Allergies  Allergen Reactions   Codeine Anaphylaxis and Swelling    Throat swells     Latex Anaphylaxis   Penicillins Anaphylaxis and Swelling    Throat closes     Sulfa Antibiotics Anaphylaxis, Swelling and Other (See Comments)    Lips and eye swell    Benzodiazepines Hives   Lidocaine Hives   Oxycodone-Acetaminophen Palpitations         Trazodone  Other (See Comments)    Sweating     Cephalexin Itching    Tolerating rocephin 06/25/20   Gabapentin Nausea And Vomiting   Lipitor [Atorvastatin]     Vomiting    Lyrica [Pregabalin]     Vomiting     Physical Exam Today's Vitals   11/24/20 1434  BP: (!) 163/69  Pulse: (!) 57  Weight: 137 lb (62.1 kg)  Height: 5\' 6"  (1.676 m)    Body mass index is 22.11 kg/m.   General: Elderly unwell looking Caucasian lady appears fatigued Head: head normocephalic and atraumatic.   Neck: supple with no carotid or supraclavicular bruits Cardiovascular: regular rate and rhythm, bradycardic, no murmurs Musculoskeletal: no deformity Skin:  no rash/petichiae Vascular:  Normal pulses all extremities  Neurologic Exam Mental Status: Awake and fully alert.  Fluent speech and language.  Oriented to place and time. Recent and remote memory intact. Attention span, concentration and fund of knowledge appropriate. Mood and affect appropriate.  Cranial Nerves: Pupils equal, briskly reactive to light. Extraocular movements full without nystagmus. Visual fields full show dense left homonymous hemianopsia confrontation. Hearing intact.  Mild left lower facial asymmetry when she smiles.  Sensation intact. tongue, palate moves normally and symmetrically.  Motor: Normal bulk and tone. Full strength left side. RUE: 4+5/5. RLE: mild hip flexor and ADF weakness Sensory:  Slightly decreased light touch sensation right lower extremity otherwise intact Coordination: Rapid alternating movements normal in all extremities except decreased right hand. Finger-to-nose and heel-to-shin mild incoordination on right side. Gait and Station: Arises from chair with  difficulty. Stance is stooped.  Gait demonstrates short shuffled steps with use of a Rollator walker-difficulty ambulating long distance  Reflexes: 1+ and symmetric. Toes downgoing.        ASSESSMENT: 77 Caucasian lady with a right posterior cerebral artery infarct  in October 2021 secondary to intracranial atherosclerosis.  She also has left upper extremity postherpetic neuralgic pain.  Vascular risk factors of hypertension, hyperlipidemia, diabetes, L CEA 11/2019 and intracranial and extracranial atherosclerosis.  More recently has been experiencing  recurrent syncopal events (felt vasovagal)  - has eval by cardiology Thursday  PLAN:  1.  History of multiple strokes -some improvement of right sided weakness - mild dysmetria noted. Continued use of RW at all times for fall prevention -Continue Plavix and Crestor for secondary stroke prevention -Discussed secondary stroke prevention measures and importance of close PCP follow-up for aggressive stroke risk factor management including HTN with BP goal<130/90 and HLD with LDL goal <70 -Longstanding uncontrolled HTN remains labile, being managed by cardiologist whom she will see this week -Continue routine follow-up with [facility] for carotid stenosis monitoring/management    Follow-up PRN as stable from stroke standpoint or call earlier if needed.    CC:  Shade Flood, MD   I spent 34 minutes of face-to-face and non-face-to-face time with patient and daughter.  This included previsit chart review, lab review, study review, order entry, electronic health record documentation, patient and daughter education education and discussion regarding history of prior stroke, residual deficits, secondary stroke prevention measures and aggressive stroke risk factor management, recurrent syncopal events and fluctuation of BP and answered all other questions to patient and daughter satisfaction   Ihor Austin, AGNP-BC  [facility] [address] [place], [place] [address]  Phone [phone] Fax [phone] Note: This document was prepared with digital dictation and possible smart phrase technology. Any transcriptional errors that result from this process are unintentional.

## 2020-11-24 NOTE — Telephone Encounter (Signed)
..  Home Health Certification or Plan of Care Tracking  Is this a Certification or Plan of Care?yes  [facility] Agency: [facility]  Order Number:  [id number]  Has charge sheet been attached? yes Where has form been placed:   In Dr. Paralee Cancel bin up front

## 2020-11-24 NOTE — Patient Instructions (Signed)
Continue clopidogrel 75 mg daily  and Crestor  for secondary stroke prevention  Please ensure follow up with cardiology/PCP regarding syncopal events   Continue to follow up with PCP regarding cholesterol and blood pressure management  Maintain strict control of hypertension with blood pressure goal below 130/90 and cholesterol with LDL cholesterol (bad cholesterol) goal below 70 mg/dL.         Thank you for coming to see [place] at [facility]. I hope we have been able to provide you high quality care today.  You may receive a patient satisfaction survey over the next few weeks. We would appreciate your feedback and comments so that we may continue to improve ourselves and the health of our patients.

## 2020-11-24 NOTE — Telephone Encounter (Signed)
..  Type of form received:  Confirmation of verbal order  Additional comments:   Received by:  Gus Height should be Faxed/mailed to: (address/ fax #)  [phone]  Is patient requesting call for pickup:  Form placed:  In Dr. Paralee Cancel bin  Attach charge sheet.  Provider will determine charge.  Individual made aware of 3-5 business day turn around No?

## 2020-11-25 DIAGNOSIS — N183 Chronic kidney disease, stage 3 unspecified: Secondary | ICD-10-CM | POA: Diagnosis not present

## 2020-11-25 DIAGNOSIS — Z9181 History of falling: Secondary | ICD-10-CM | POA: Diagnosis not present

## 2020-11-25 DIAGNOSIS — F32A Depression, unspecified: Secondary | ICD-10-CM | POA: Diagnosis not present

## 2020-11-25 DIAGNOSIS — Z87891 Personal history of nicotine dependence: Secondary | ICD-10-CM | POA: Diagnosis not present

## 2020-11-25 DIAGNOSIS — E1122 Type 2 diabetes mellitus with diabetic chronic kidney disease: Secondary | ICD-10-CM | POA: Diagnosis not present

## 2020-11-25 DIAGNOSIS — Z7902 Long term (current) use of antithrombotics/antiplatelets: Secondary | ICD-10-CM | POA: Diagnosis not present

## 2020-11-25 DIAGNOSIS — N390 Urinary tract infection, site not specified: Secondary | ICD-10-CM | POA: Diagnosis not present

## 2020-11-25 DIAGNOSIS — E7800 Pure hypercholesterolemia, unspecified: Secondary | ICD-10-CM | POA: Diagnosis not present

## 2020-11-25 DIAGNOSIS — E1136 Type 2 diabetes mellitus with diabetic cataract: Secondary | ICD-10-CM | POA: Diagnosis not present

## 2020-11-25 DIAGNOSIS — E871 Hypo-osmolality and hyponatremia: Secondary | ICD-10-CM | POA: Diagnosis not present

## 2020-11-25 DIAGNOSIS — D631 Anemia in chronic kidney disease: Secondary | ICD-10-CM | POA: Diagnosis not present

## 2020-11-25 DIAGNOSIS — I69351 Hemiplegia and hemiparesis following cerebral infarction affecting right dominant side: Secondary | ICD-10-CM | POA: Diagnosis not present

## 2020-11-25 DIAGNOSIS — N179 Acute kidney failure, unspecified: Secondary | ICD-10-CM | POA: Diagnosis not present

## 2020-11-25 DIAGNOSIS — H5462 Unqualified visual loss, left eye, normal vision right eye: Secondary | ICD-10-CM | POA: Diagnosis not present

## 2020-11-25 DIAGNOSIS — G3184 Mild cognitive impairment, so stated: Secondary | ICD-10-CM | POA: Diagnosis not present

## 2020-11-25 DIAGNOSIS — I1310 Hypertensive heart and chronic kidney disease without heart failure, with stage 1 through stage 4 chronic kidney disease, or unspecified chronic kidney disease: Secondary | ICD-10-CM | POA: Diagnosis not present

## 2020-11-25 NOTE — Telephone Encounter (Signed)
Faxed

## 2020-11-27 ENCOUNTER — Other Ambulatory Visit: Payer: Self-pay

## 2020-11-27 ENCOUNTER — Ambulatory Visit (INDEPENDENT_AMBULATORY_CARE_PROVIDER_SITE_OTHER): Payer: PPO | Admitting: Cardiology

## 2020-11-27 ENCOUNTER — Encounter (HOSPITAL_BASED_OUTPATIENT_CLINIC_OR_DEPARTMENT_OTHER): Payer: Self-pay | Admitting: Cardiology

## 2020-11-27 VITALS — BP 152/70 | HR 60 | Ht 66.0 in | Wt 139.3 lb

## 2020-11-27 DIAGNOSIS — I6522 Occlusion and stenosis of left carotid artery: Secondary | ICD-10-CM

## 2020-11-27 DIAGNOSIS — Z8673 Personal history of transient ischemic attack (TIA), and cerebral infarction without residual deficits: Secondary | ICD-10-CM | POA: Diagnosis not present

## 2020-11-27 DIAGNOSIS — N1832 Chronic kidney disease, stage 3b: Secondary | ICD-10-CM

## 2020-11-27 DIAGNOSIS — E785 Hyperlipidemia, unspecified: Secondary | ICD-10-CM

## 2020-11-27 DIAGNOSIS — I10 Essential (primary) hypertension: Secondary | ICD-10-CM | POA: Diagnosis not present

## 2020-11-27 DIAGNOSIS — E118 Type 2 diabetes mellitus with unspecified complications: Secondary | ICD-10-CM

## 2020-11-27 NOTE — Patient Instructions (Addendum)
8 AM: amlodipine 10 mg, carvedilol 3.125 mg, clopidogrel 75 mg, hydralazine 25 mg, iron 325 mg, torsemide 20 mg  Stop Mid-day hydralazine 25 mg.  Evening: hydralazine 25 mg, lisinopril 20 mg, tamsulosin 0.4 mg every other day, rosuvastatin 5 mg, carvedilol 3.125 mg  I also recommend the following parameters: If blood pressure is <120/70, I would hold the next dose of hydralazine.  If one hour after taking medications the blood pressure is >170 on the top number, I would take an extra hydralazine 25 mg.  Try to eat smaller meals throughout the day. If you are going to have a big meal, I would be prepared for the blood pressure to drop. If you are somewhere where you can, lay on the floor and elevate the legs (on a chair, etc).  Your physician recommends that you schedule a follow-up appointment in: Tuesday January 10 @9 :20 am

## 2020-11-27 NOTE — Progress Notes (Signed)
Cardiology Office Note:    Date:  11/27/2020   ID:  Chelsea [name], Chelsea [name] 1945-05-29, MRN [id number]  PCP:  Wendie Agreste, MD  Cardiologist:  Buford Dresser, MD  Referring MD: Wendie Agreste, MD   CC: follow up  History of Present Illness:    Chelsea [name] is a 75 y.o. female with a hx of CVA, cardiac arrest, carotid artery occlusion, diabetes mellitus without complication, hypertension, and stroke who is seen for follow up today. I initially met her 06/18/20 as a new consult at the request of Wendie Agreste, MD for the evaluation and management of uncontrolled hypertension.  Today: Had an episode while in a restaurant. Eyes glazed over, flushed, sweating, clammy. Didn't completely pass out but called EMS to have her check. BP 70/40. Was in the middle of lunch. Time before was just after eating. Usually around lunchtime. When this happens, feels like the back of her neck is weak and can't hold her head up.  We reviewed her medication regimen. Hasn't taken AM meds yet today.    8 AM: amlodipine 10 mg, carvedilol 3.125 mg, clopidogrel 75 mg, hydralazine 25 mg, iron 325 mg, torsemide 20 mg  Mid-day: hydralazine 25 mg-->we decided to stop this as her mid day meal is her biggest and when she typically has symptoms.  I would take the PM torsemide dose only if there is a lot of swelling. She has not required this.  Evening: hydralazine 25 mg, lisinopril 20 mg, tamsulosin 0.4 mg every other day, rosuvastatin 5 mg, carvedilol 3.125 mg  I also recommend the following parameters: If blood pressure is <120/70, I would hold the next dose of hydralazine.  If one hour after taking medications the blood pressure is >170 on the top number, I would take an extra hydralazine 25 mg.  Had been doing therapy, walking up until recently.   Past Medical History:  Diagnosis Date   Allergy    Anemia    Blind left eye    Cardiac arrest [facility])    Carotid artery occlusion    Cataract     Depression    Diabetes mellitus without complication ([place])    Hyperlipidemia    Hypertension    Oxygen deficiency    Stroke [facility]     Past Surgical History:  Procedure Laterality Date   ENDARTERECTOMY Left 12/03/2019   Procedure: LEFT CAROTID ENDARTERECTOMY;  Surgeon: Angelia Mould, MD;  Location: [facility];  Service: Vascular;  Laterality: Left;   PATCH ANGIOPLASTY Left 12/03/2019   Procedure: PATCH ANGIOPLASTY Left Carotid Artery;  Surgeon: Angelia Mould, MD;  Location: [facility] OR;  Service: Vascular;  Laterality: Left;    Current Medications: Current Outpatient Medications on File Prior to Visit  Medication Sig   acetaminophen (TYLENOL) 500 MG tablet Take 1,000 mg by mouth every 6 (six) hours as needed for moderate pain.   amLODipine (NORVASC) 10 MG tablet Take 1 tablet (10 mg total) by mouth daily.   carvedilol (COREG) 3.125 MG tablet Take 1 tablet (3.125 mg total) by mouth 2 (two) times daily with a meal.   clopidogrel (PLAVIX) 75 MG tablet TAKE 1 TABLET ONCE DAILY   Elastic Bandages & Supports (MEDICAL COMPRESSION SOCKS) MISC 1 Units by Does not apply route daily.   Ferrous Sulfate (IRON) 325 (65 Fe) MG TABS Take 325 mg by mouth every other day.   furosemide (LASIX) 20 MG tablet Take 20 mg by mouth daily as needed for fluid or  edema.   guaiFENesin (MUCINEX) 600 MG 12 hr tablet Take 1 tablet (600 mg total) by mouth 2 (two) times daily.   hydrALAZINE (APRESOLINE) 25 MG tablet Take 1 tablet (25 mg total) by mouth 3 (three) times daily.   lisinopril (ZESTRIL) 20 MG tablet Take 1 tablet (20 mg total) by mouth daily.   rosuvastatin (CRESTOR) 5 MG tablet Take 5 mg every other day for 2 weeks, then increase to 5 mg daily. (Patient taking differently: Take 5 mg by mouth daily.)   tamsulosin (FLOMAX) 0.4 MG CAPS capsule Take 0.4 mg by mouth every evening.   torsemide (DEMADEX) 20 MG tablet Take 20 mg by mouth 2 (two) times daily.   No current facility-administered medications on  file prior to visit.     Allergies:   Codeine, Latex, Penicillins, Sulfa antibiotics, Benzodiazepines, Lidocaine, Oxycodone-acetaminophen, Trazodone, Cephalexin, Gabapentin, Lipitor [atorvastatin], and Lyrica [pregabalin]   Social History   Tobacco Use   Smoking status: Former    Types: Cigarettes   Smokeless tobacco: Never  Vaping Use   Vaping Use: Never used  Substance Use Topics   Alcohol use: Not Currently   Drug use: Never    Family History: family history includes Arthritis in her brother, daughter, father, mother, and sister; COPD in her sister; Cancer in her mother; Depression in her brother, daughter, sister, and son; Diabetes in her brother, daughter, father, mother, sister, and son; Heart attack in her mother and sister; Heart disease in her brother and sister; Hyperlipidemia in her father and mother; Hypertension in her brother, daughter, father, mother, and sister; Stroke in her brother, daughter, and father.  ROS:   Please see the history of present illness.  Additional pertinent ROS otherwise unremarkable.   EKGs/Labs/Other Studies Reviewed:    The following studies were reviewed today: Echo 10/07/20 1. Peak LV outflow tract velocity 2.7 m/s, peak gradient 29 mmHg (no  significant obstruction). Left ventricular ejection fraction, by  estimation, is 70 to 75%. The left ventricle has hyperdynamic function.  The left ventricle has no regional wall motion  abnormalities. There is mild left ventricular hypertrophy. Left  ventricular diastolic parameters are consistent with Grade I diastolic  dysfunction (impaired relaxation).   2. Right ventricular systolic function is normal. The right ventricular  size is normal.   3. The mitral valve is normal in structure. No evidence of mitral valve  regurgitation. No evidence of mitral stenosis. Moderate mitral annular  calcification.   4. The aortic valve is tricuspid. Aortic valve regurgitation is not  visualized. Mild  aortic valve sclerosis is present, with no evidence of  aortic valve stenosis.   5. The inferior vena cava is normal in size with greater than 50%  respiratory variability, suggesting right atrial pressure of 3 mmHg.   [place] LE Venous 12/12/2019: RIGHT:  - There is no evidence of deep vein thrombosis in the lower extremity.  - No cystic structure found in the popliteal fossa.     LEFT:  - No evidence of common femoral vein obstruction.  Echo 11/07/2019:  1. Left ventricular ejection fraction, by estimation, is 60 to 65%. The  left ventricle has normal function. The left ventricle has no regional  wall motion abnormalities. There is mild asymmetric left ventricular  hypertrophy of the basal-septal segment.  Left ventricular diastolic parameters are consistent with Grade I  diastolic dysfunction (impaired relaxation). Elevated left ventricular  end-diastolic pressure.   2. Right ventricular systolic function is normal. The right ventricular  size  is normal.   3. Left atrial size was moderately dilated.   4. The mitral valve is normal in structure. Trivial mitral valve  regurgitation. No evidence of mitral stenosis. Moderate mitral annular  calcification.   5. The aortic valve is tricuspid. There is mild calcification of the  aortic valve. There is mild thickening of the aortic valve. Aortic valve  regurgitation is not visualized. Mild aortic valve sclerosis is present,  with no evidence of aortic valve  stenosis.   6. The inferior vena cava is normal in size with greater than 50%  respiratory variability, suggesting right atrial pressure of 3 mmHg.   7. Agitated saline contrast bubble study was negative, with no evidence  of any interatrial shunt.   Comparison(s): No prior Echocardiogram.   Conclusion(s)/Recommendation(s): Normal biventricular function without  evidence of hemodynamically significant valvular heart disease. No  intracardiac source of embolism detected on this  transthoracic study. A  transesophageal echocardiogram is  recommended to exclude cardiac source of embolism if clinically indicated.   US Carotid Duplex 11/02/2019: Summary:  Right Carotid: Velocities in the right ICA are consistent with a 1-39%  stenosis.   Left Carotid: Velocities in the left ICA are consistent with a 80-99%  stenosis.   Vertebrals:  Bilateral vertebral arteries demonstrate antegrade flow.  Subclavians: Normal flow hemodynamics were seen in bilateral subclavian arteries.   EKG:  EKG is personally reviewed.   06/18/2020: sinus bradycardia at 59 bpm, nonspecific t wave pattern  Recent Labs: 09/29/2020: ALT 8 10/09/2020: Magnesium 1.9 11/20/2020: BUN 48; Creatinine, Ser 2.17; Hemoglobin 10.2; Platelets 229; Potassium 4.2; Sodium 137  Recent Lipid Panel    Component Value Date/Time   CHOL 173 11/08/2019 0406   TRIG 82 11/08/2019 0406   HDL 36 (L) 11/08/2019 0406   CHOLHDL 4.8 11/08/2019 0406   VLDL 16 11/08/2019 0406   LDLCALC 121 (H) 11/08/2019 0406    Physical Exam:    VS:  BP (!) 152/70 (BP Location: Left Arm, Patient Position: Sitting, Cuff Size: Normal)   Pulse 60   Ht _0  (1.676 m)   Wt 139 lb 4.8 oz (63.2 kg)   SpO2 98%   BMI 22.48 kg/m     Wt Readings from Last 3 Encounters:  11/27/20 139 lb 4.8 oz (63.2 kg)  11/24/20 137 lb (62.1 kg)  11/20/20 138 lb (62.6 kg)    GEN: Well nourished, well developed in no acute distress HEENT: Normal, moist mucous membranes NECK: No JVD CARDIAC: regular rhythm, normal S1 and S2, no rubs or gallops. No significant murmur appreciated today VASCULAR: Radial and DP pulses 2+ bilaterally. +left carotid bruit RESPIRATORY:  Clear to auscultation without wheezing or rhonchi. Slight rales at right base ABDOMEN: Soft, non-tender, non-distended MUSCULOSKELETAL:  Moves all 4 limbs independently, in wheelchair SKIN: Warm and dry, no LE edema, compression stockings in place NEUROLOGIC:  Alert and oriented x 3. No focal  neuro deficits noted. PSYCHIATRIC:  Normal affect    ASSESSMENT:    1. Essential hypertension   2. History of CVA (cerebrovascular accident)   3. Carotid stenosis, left   4. Hyperlipidemia LDL goal <70   5. Type 2 diabetes mellitus with complication, without long-term current use of insulin (HCC)   6. Stage 3b chronic kidney disease (HCC)    PLAN:    History of CVA Carotid stenosis, left Hypercholesterolemia -continue clopidogrel -last LDL 121, goal <70 -we discussed intensifying/changing her regimen today. She would like to work on the blood pressure  first and then re-address the cholesterol  Hypertension, labile -we reviewed her regimen and timing at length, see above -difficult situation. She is at high risk for syncope, falls etc with hypotension. She can have very elevated blood pressures as well. We adjusted parts of her regimen today, see below. Hydralazine is the easiest med to change if needed, gave parameters on when to hold/when to give extra  Type II diabetes Chronic kidney disease, stage 3b (GFR 39) Former smoker -no edema on torsemide. Given CKD, continue daily dose with PRN second daily dose for edema -With frequent UTIs, SGLT2i not ideal. Considered GLP1RA, though she has had issues with lack of appetite/weight loss, so would not start at this time. -given good control of sugars in the hospital, and renal disease, I would favor monitoring sugars and not starting meds unless they rise significantly. A1c has been 5.9-6.4 recently.  Cardiac risk counseling and prevention recommendations: -recommend heart healthy/Mediterranean diet, with whole grains, fruits, vegetable, fish, lean meats, nuts, and olive oil. Limit salt. -limited mobility, cannot walk for significant distance -recommend avoidance of tobacco products. Avoid excess alcohol.  Plan for follow up: 6-8 weeks or sooner as needed.  Buford Dresser, MD, PhD, [facility]     Medication Adjustments/Labs and Tests Ordered: Current medicines are reviewed at length with the patient today.  Concerns regarding medicines are outlined above.  No orders of the defined types were placed in this encounter.  No orders of the defined types were placed in this encounter.    Patient Instructions  8 AM: amlodipine 10 mg, carvedilol 3.125 mg, clopidogrel 75 mg, hydralazine 25 mg, iron 325 mg, torsemide 20 mg  Stop Mid-day hydralazine 25 mg.  Evening: hydralazine 25 mg, lisinopril 20 mg, tamsulosin 0.4 mg every other day, rosuvastatin 5 mg, carvedilol 3.125 mg  I also recommend the following parameters: If blood pressure is <120/70, I would hold the next dose of hydralazine.  If one hour after taking medications the blood pressure is >170 on the top number, I would take an extra hydralazine 25 mg.  Try to eat smaller meals throughout the day. If you are going to have a big meal, I would be prepared for the blood pressure to drop. If you are somewhere where you can, lay on the floor and elevate the legs (on a chair, etc).  Your physician recommends that you schedule a follow-up appointment in: Tuesday January 10 _0 :20 am   Signed, Buford Dresser, MD PhD 11/27/2020 3:37 PM    [place]

## 2020-12-04 DIAGNOSIS — I63531 Cerebral infarction due to unspecified occlusion or stenosis of right posterior cerebral artery: Secondary | ICD-10-CM | POA: Diagnosis not present

## 2020-12-08 ENCOUNTER — Telehealth: Payer: Self-pay

## 2020-12-08 NOTE — Telephone Encounter (Signed)
Letter has been sent to patient instructing them to call us if they are still interested in completing their sleep study. If we have not received a response from the patient within 30 days of this notice, the order will be cancelled and they will need to discuss the need for a sleep study at their next office visit.  ° °

## 2020-12-11 DIAGNOSIS — R55 Syncope and collapse: Secondary | ICD-10-CM | POA: Diagnosis not present

## 2020-12-11 DIAGNOSIS — N1832 Chronic kidney disease, stage 3b: Secondary | ICD-10-CM | POA: Diagnosis not present

## 2020-12-11 DIAGNOSIS — D631 Anemia in chronic kidney disease: Secondary | ICD-10-CM | POA: Diagnosis not present

## 2020-12-11 DIAGNOSIS — I129 Hypertensive chronic kidney disease with stage 1 through stage 4 chronic kidney disease, or unspecified chronic kidney disease: Secondary | ICD-10-CM | POA: Diagnosis not present

## 2020-12-11 DIAGNOSIS — I639 Cerebral infarction, unspecified: Secondary | ICD-10-CM | POA: Diagnosis not present

## 2020-12-11 DIAGNOSIS — R600 Localized edema: Secondary | ICD-10-CM | POA: Diagnosis not present

## 2020-12-11 DIAGNOSIS — E1122 Type 2 diabetes mellitus with diabetic chronic kidney disease: Secondary | ICD-10-CM | POA: Diagnosis not present

## 2020-12-15 ENCOUNTER — Ambulatory Visit (HOSPITAL_BASED_OUTPATIENT_CLINIC_OR_DEPARTMENT_OTHER): Payer: PPO | Admitting: Cardiology

## 2020-12-23 ENCOUNTER — Telehealth: Payer: Self-pay

## 2020-12-23 NOTE — Telephone Encounter (Signed)
Opened in error

## 2020-12-25 ENCOUNTER — Ambulatory Visit (HOSPITAL_BASED_OUTPATIENT_CLINIC_OR_DEPARTMENT_OTHER): Payer: PPO | Admitting: Cardiology

## 2020-12-29 NOTE — Progress Notes (Signed)
Opened in error

## 2020-12-31 ENCOUNTER — Other Ambulatory Visit (HOSPITAL_BASED_OUTPATIENT_CLINIC_OR_DEPARTMENT_OTHER): Payer: Self-pay | Admitting: Cardiology

## 2021-01-07 NOTE — Telephone Encounter (Signed)
Sleep study order deleted. Letter was previously mailed to pt 30 days ago. Pt can call back to scheduled sleep study.

## 2021-01-08 ENCOUNTER — Encounter (HOSPITAL_COMMUNITY): Payer: Self-pay

## 2021-01-08 ENCOUNTER — Emergency Department (HOSPITAL_COMMUNITY)
Admission: EM | Admit: 2021-01-08 | Discharge: 2021-01-09 | Disposition: A | Discharge status: Home / Self Care | Payer: PPO | Attending: Emergency Medicine | Admitting: Emergency Medicine

## 2021-01-08 DIAGNOSIS — N760 Acute vaginitis: Secondary | ICD-10-CM | POA: Diagnosis not present

## 2021-01-08 DIAGNOSIS — Z87891 Personal history of nicotine dependence: Secondary | ICD-10-CM | POA: Insufficient documentation

## 2021-01-08 DIAGNOSIS — R509 Fever, unspecified: Secondary | ICD-10-CM | POA: Diagnosis present

## 2021-01-08 DIAGNOSIS — Z91040 Latex allergy status: Secondary | ICD-10-CM | POA: Insufficient documentation

## 2021-01-08 DIAGNOSIS — Z79899 Other long term (current) drug therapy: Secondary | ICD-10-CM | POA: Insufficient documentation

## 2021-01-08 DIAGNOSIS — B9689 Other specified bacterial agents as the cause of diseases classified elsewhere: Secondary | ICD-10-CM | POA: Insufficient documentation

## 2021-01-08 DIAGNOSIS — J189 Pneumonia, unspecified organism: Secondary | ICD-10-CM

## 2021-01-08 DIAGNOSIS — E119 Type 2 diabetes mellitus without complications: Secondary | ICD-10-CM | POA: Diagnosis not present

## 2021-01-08 DIAGNOSIS — N1832 Chronic kidney disease, stage 3b: Secondary | ICD-10-CM | POA: Insufficient documentation

## 2021-01-08 DIAGNOSIS — I129 Hypertensive chronic kidney disease with stage 1 through stage 4 chronic kidney disease, or unspecified chronic kidney disease: Secondary | ICD-10-CM | POA: Diagnosis not present

## 2021-01-08 DIAGNOSIS — J181 Lobar pneumonia, unspecified organism: Secondary | ICD-10-CM | POA: Diagnosis not present

## 2021-01-08 LAB — CBC WITH DIFFERENTIAL/PLATELET
Abs Immature Granulocytes: 0.1 10*3/uL — ABNORMAL HIGH (ref 0.00–0.07)
Basophils Absolute: 0.1 10*3/uL (ref 0.0–0.1)
Basophils Relative: 0 %
Eosinophils Absolute: 0.1 10*3/uL (ref 0.0–0.5)
Eosinophils Relative: 1 %
HCT: 35 % — ABNORMAL LOW (ref 36.0–46.0)
Hemoglobin: 11.7 g/dL — ABNORMAL LOW (ref 12.0–15.0)
Immature Granulocytes: 1 %
Lymphocytes Relative: 6 %
Lymphs Abs: 1.1 10*3/uL (ref 0.7–4.0)
MCH: 31.6 pg (ref 26.0–34.0)
MCHC: 33.4 g/dL (ref 30.0–36.0)
MCV: 94.6 fL (ref 80.0–100.0)
Monocytes Absolute: 0.8 10*3/uL (ref 0.1–1.0)
Monocytes Relative: 4 %
Neutro Abs: 18.4 10*3/uL — ABNORMAL HIGH (ref 1.7–7.7)
Neutrophils Relative %: 88 %
Platelets: 307 10*3/uL (ref 150–400)
RBC: 3.7 MIL/uL — ABNORMAL LOW (ref 3.87–5.11)
RDW: 12.5 % (ref 11.5–15.5)
WBC: 20.5 10*3/uL — ABNORMAL HIGH (ref 4.0–10.5)
nRBC: 0 % (ref 0.0–0.2)

## 2021-01-08 LAB — URINALYSIS, ROUTINE W REFLEX MICROSCOPIC
Bacteria, UA: NONE SEEN
Bilirubin Urine: NEGATIVE
Glucose, UA: NEGATIVE mg/dL
Ketones, ur: NEGATIVE mg/dL
Leukocytes,Ua: NEGATIVE
Nitrite: NEGATIVE
Protein, ur: NEGATIVE mg/dL
Specific Gravity, Urine: 1.008 (ref 1.005–1.030)
pH: 5 (ref 5.0–8.0)

## 2021-01-08 LAB — BASIC METABOLIC PANEL
Anion gap: 11 (ref 5–15)
BUN: 58 mg/dL — ABNORMAL HIGH (ref 8–23)
CO2: 24 mmol/L (ref 22–32)
Calcium: 9.3 mg/dL (ref 8.9–10.3)
Chloride: 99 mmol/L (ref 98–111)
Creatinine, Ser: 2.12 mg/dL — ABNORMAL HIGH (ref 0.44–1.00)
GFR, Estimated: 24 mL/min — ABNORMAL LOW (ref >60)
Glucose, Bld: 128 mg/dL — ABNORMAL HIGH (ref 70–99)
Potassium: 4 mmol/L (ref 3.5–5.1)
Sodium: 134 mmol/L — ABNORMAL LOW (ref 135–145)

## 2021-01-08 LAB — LACTIC ACID, PLASMA: Lactic Acid, Venous: 0.8 mmol/L (ref 0.5–1.9)

## 2021-01-08 NOTE — ED Provider Notes (Signed)
Emergency Medicine Provider Triage Evaluation Note  Chelsea [name] , a 75 y.o. female  was evaluated in triage.  Pt complains of nausea, weakness, fever at home.  Currently on Macrobid being treated for urinary tract infection based on urine dipstick in 2 days ago.  According to her daughter at the bedside, T-max at home was 35 F checked axillary.  Patient has history of poorly tolerating Macrobid in the past.  Review of Systems  Positive: Urinary frequency on diuretics, weakness, nausea, subjective fever, chills Negative: Dysuria, urinary urgency vomiting  Physical Exam  BP (!) 150/68 (BP Location: Left Arm)    Pulse 84    Temp 98.8 F (37.1 C) (Oral)    Resp 18    SpO2 98%  Gen:   Awake, no distress   Resp:  Normal effort  MSK:   Moves extremities without difficulty  Other:  RRR no M/R/G.  Lungs CTA B.  No CVAT.  Patient is ill-appearing  Medical Decision Making  Medically screening exam initiated at 1:58 PM.  Appropriate orders placed.  Chelsea [name] was informed that the remainder of the evaluation will be completed by another provider, this initial triage assessment does not replace that evaluation, and the importance of remaining in the ED until their evaluation is complete.  This chart was dictated using voice recognition software, Dragon. Despite the best efforts of this provider to proofread and correct errors, errors may still occur which can change documentation meaning.    Paris Lore, PA-C 01/08/21 1359    Margarita Grizzle, MD 01/08/21 [phone]

## 2021-01-08 NOTE — ED Triage Notes (Signed)
Pt arrived via POV, c/o fever at home. Dx with UTI yesterday. States 3 doses of abx given.

## 2021-01-09 ENCOUNTER — Emergency Department (HOSPITAL_COMMUNITY): Payer: PPO

## 2021-01-09 LAB — URINE CULTURE: Culture: NO GROWTH

## 2021-01-09 MED ORDER — METRONIDAZOLE 0.75 % VA GEL: 1.0000 | Freq: Two times a day (BID) | VAGINAL | 0 refills | Status: DC

## 2021-01-09 MED ORDER — LACTATED RINGERS IV BOLUS
500.0000 mL | Freq: Once | INTRAVENOUS | Status: AC
  Administered 2021-01-09: 09:00:00 500 mL via INTRAVENOUS

## 2021-01-09 MED ORDER — LEVOFLOXACIN 750 MG PO TABS: 750.0000 mg | ORAL_TABLET | Freq: Every day | ORAL | 0 refills | Status: AC

## 2021-01-09 NOTE — Discharge Instructions (Addendum)
Your x-ray and CT today show that you have pneumonia in your left lobe.  This is likely what is causing your elevated white blood cell count.  Fortunately, your vitals and physical exam today were very reassuring and we believe that this can be treated with outpatient antibiotics.  Have sent in a prescription for Levaquin which she can take for 5 days starting today.  You also had vaginal discharge and have a frequent history of bacterial vaginosis.  We will treat this empirically with metronidazole vaginal cream which you have stated worked in the past.   Please monitor your O2 saturations with your pulse ox at home and return if your levels get under 92% and if you develop worsening shortness of breath, fevers or fast heart rate. Follow up with your PCP in 4-5 days to recheck xray and ensure the pneumonia is responding to treatment. Feel better soon!

## 2021-01-09 NOTE — ED Provider Notes (Signed)
[facility]-EMERGENCY DEPT Provider Note   CSN: [id number] Arrival date & time: 01/08/21  1256     History Chief Complaint  Patient presents with   Urinary Tract Infection    Chelsea [name] is a 75 y.o. female with history as described below who presents to the ED for nausea, weakness fever at home.  Patient is currently being treated for suspected UTI with Macrobid based on urine dipstick 2 days ago.  Patient has a history of poorly tolerating Macrobid in the past.  Patient denies urinary symptoms including Dysuria, hematuria and increased frequency.  Daughter is unsure that she actually had a UTI and patient continues to feel weaker with worsening symptoms.  T-max at home was 13 F axillar her stroke and is unable to characterize whether it feels the same or worse today.  Patient states that she has chronic cough following she denies chest pain, abdominal pain, diarrhea, syncope and headache.  Patient and daughter also note a greenish white-colored vaginal discharge.  Patient has had this before and was prescribed metronidazole cream with improvement.   Urinary Tract Infection Associated symptoms: fever, nausea and vaginal discharge   Associated symptoms: no abdominal pain and no vomiting       Past Medical History:  Diagnosis Date   Allergy    Anemia    Blind left eye    Cardiac arrest (HCC)    Carotid artery occlusion    Cataract    Depression    Diabetes mellitus without complication (HCC)    Hyperlipidemia    Hypertension    Oxygen deficiency    Stroke [facility])     Patient Active Problem List   Diagnosis Date Noted   Hyponatremia 10/07/2020   AKI (acute kidney injury) (HCC) 10/07/2020   Acute kidney injury superimposed on CKD (HCC) 10/07/2020   Dehydration with hyponatremia    Syncope 10/06/2020   History of CVA (cerebrovascular accident) 07/10/2020   Pure hypercholesterolemia 07/10/2020   Type 2 diabetes mellitus with complication, without  long-term current use of insulin (HCC) 07/10/2020   Malnutrition of moderate degree 06/27/2020   Stage 3b chronic kidney disease (HCC) 06/25/2020   CAP (community acquired pneumonia) 06/25/2020   Stenosis of left carotid artery 12/03/2019   Stroke [facility])    Pain    Acute ischemic right posterior cerebral artery (PCA) stroke (HCC) 11/09/2019   Essential hypertension    Controlled type 2 diabetes mellitus with hyperglycemia (HCC)    Postherpetic neuralgia    Uncontrolled type 2 diabetes mellitus with hyperglycemia, without long-term current use of insulin (HCC) 11/07/2019   Hypertensive urgency 11/07/2019   Stenosis of left internal carotid artery 11/07/2019   Intractable nausea and vomiting 11/07/2019   Nicotine dependence, cigarettes, uncomplicated 11/07/2019   HZV (herpes zoster virus) post herpetic neuralgia 11/07/2019   Weakness of right lower extremity 11/07/2019   CVA (cerebral vascular accident) (HCC) 11/07/2019    Past Surgical History:  Procedure Laterality Date   ENDARTERECTOMY Left 12/03/2019   Procedure: LEFT CAROTID ENDARTERECTOMY;  Surgeon: Chuck Hint, MD;  Location: [facility] OR;  Service: Vascular;  Laterality: Left;   PATCH ANGIOPLASTY Left 12/03/2019   Procedure: PATCH ANGIOPLASTY Left Carotid Artery;  Surgeon: Chuck Hint, MD;  Location: [facility] OR;  Service: Vascular;  Laterality: Left;     OB History     Gravida  2   Para  2   Term      Preterm      AB  Living         SAB      IAB      Ectopic      Multiple      Live Births              Family History  Problem Relation Age of Onset   Hypertension Mother    Hyperlipidemia Mother    Diabetes Mother    Cancer Mother    Arthritis Mother    Heart attack Mother    Hypertension Father    Hyperlipidemia Father    Diabetes Father    Arthritis Father    Stroke Father    Hypertension Sister    Heart disease Sister    Heart attack Sister    Diabetes Sister     Depression Sister    COPD Sister    Arthritis Sister    Stroke Brother    Hypertension Brother    Heart disease Brother    Diabetes Brother    Depression Brother    Arthritis Brother    Stroke Daughter    Hypertension Daughter    Diabetes Daughter    Depression Daughter    Arthritis Daughter    Diabetes Son    Depression Son     Social History   Tobacco Use   Smoking status: Former    Types: Cigarettes   Smokeless tobacco: Never  Vaping Use   Vaping Use: Never used  Substance Use Topics   Alcohol use: Not Currently   Drug use: Never    Home Medications Prior to Admission medications   Medication Sig Start Date End Date Taking? Authorizing Provider  acetaminophen (TYLENOL) 500 MG tablet Take 1,000 mg by mouth every 6 (six) hours as needed for moderate pain.    [provider]  amLODipine (NORVASC) 10 MG tablet Take 1 tablet (10 mg total) by mouth daily. 09/12/20   Alver Sorrow, NP  carvedilol (COREG) 3.125 MG tablet Take 1 tablet (3.125 mg total) by mouth 2 (two) times daily with a meal. 06/18/20   Jodelle Red, MD  clopidogrel (PLAVIX) 75 MG tablet TAKE 1 TABLET ONCE DAILY 12/31/20   Jodelle Red, MD  Elastic Bandages & Supports (MEDICAL COMPRESSION SOCKS) MISC 1 Units by Does not apply route daily. 09/12/20   Alver Sorrow, NP  Ferrous Sulfate (IRON) 325 (65 Fe) MG TABS Take 325 mg by mouth every other day.    [provider]  furosemide (LASIX) 20 MG tablet Take 20 mg by mouth daily as needed for fluid or edema. 10/10/20   [provider]  guaiFENesin (MUCINEX) 600 MG 12 hr tablet Take 1 tablet (600 mg total) by mouth 2 (two) times daily. 10/10/20   Albertine Grates, MD  hydrALAZINE (APRESOLINE) 25 MG tablet Take 1 tablet (25 mg total) by mouth 3 (three) times daily. 11/05/20 10/31/21  Jodelle Red, MD  lisinopril (ZESTRIL) 20 MG tablet Take 1 tablet (20 mg total) by mouth daily. 11/07/20   Jodelle Red, MD   rosuvastatin (CRESTOR) 5 MG tablet Take 5 mg every other day for 2 weeks, then increase to 5 mg daily. Patient taking differently: Take 5 mg by mouth daily. 06/18/20   Jodelle Red, MD  tamsulosin (FLOMAX) 0.4 MG CAPS capsule Take 0.4 mg by mouth every evening. 07/07/20   [provider]  torsemide (DEMADEX) 20 MG tablet Take 20 mg by mouth 2 (two) times daily. 10/23/20   [provider]    Allergies  Codeine, Latex, Penicillins, Sulfa antibiotics, Benzodiazepines, Lidocaine, Oxycodone-acetaminophen, Trazodone, Cephalexin, Gabapentin, Lipitor [atorvastatin], and Lyrica [pregabalin]  Review of Systems   Review of Systems  Constitutional:  Positive for chills and fever.  HENT: Negative.    Eyes: Negative.   Respiratory:  Positive for cough. Negative for shortness of breath.   Cardiovascular: Negative.   Gastrointestinal:  Positive for nausea. Negative for abdominal pain and vomiting.  Endocrine: Negative.   Genitourinary:  Positive for vaginal discharge. Negative for dysuria and hematuria.  Musculoskeletal: Negative.   Skin:  Negative for rash.  Neurological:  Negative for headaches.  All other systems reviewed and are negative.  Physical Exam Updated Vital Signs BP (!) 132/112    Pulse 64    Temp 98.3 F (36.8 C) (Oral)    Resp 14    Ht 5\' 6"  (1.676 m)    Wt 63.2 kg    SpO2 99%    BMI 22.48 kg/m   Physical Exam Vitals and nursing note reviewed.  Constitutional:      General: She is not in acute distress.    Appearance: She is not ill-appearing.  HENT:     Head: Atraumatic.  Eyes:     Conjunctiva/sclera: Conjunctivae normal.  Cardiovascular:     Rate and Rhythm: Normal rate and regular rhythm.     Pulses: Normal pulses.     Heart sounds: No murmur heard. Pulmonary:     Effort: Pulmonary effort is normal. No respiratory distress.     Breath sounds: Rales present.     Comments: Fine rales on left side.  Possibly some on right side.  Patient is  not tachypneic without increased work of breathing or accessory muscle usage.  O2 at 99% Abdominal:     General: Abdomen is flat. There is no distension.     Palpations: Abdomen is soft.     Tenderness: There is no abdominal tenderness.  Musculoskeletal:        General: Normal range of motion.     Cervical back: Normal range of motion.  Skin:    General: Skin is warm and dry.     Capillary Refill: Capillary refill takes less than 2 seconds.  Neurological:     General: No focal deficit present.     Mental Status: She is alert.  Psychiatric:        Mood and Affect: Mood normal.    ED Results / Procedures / Treatments   Labs (all labs ordered are listed, but only abnormal results are displayed) Labs Reviewed  URINALYSIS, ROUTINE W REFLEX MICROSCOPIC - Abnormal; Notable for the following components:      Result Value   Color, Urine STRAW (*)    Hgb urine dipstick SMALL (*)    All other components within normal limits  CBC WITH DIFFERENTIAL/PLATELET - Abnormal; Notable for the following components:   WBC 20.5 (*)    RBC 3.70 (*)    Hemoglobin 11.7 (*)    HCT 35.0 (*)    Neutro Abs 18.4 (*)    Abs Immature Granulocytes 0.10 (*)    All other components within normal limits  BASIC METABOLIC PANEL - Abnormal; Notable for the following components:   Sodium 134 (*)    Glucose, Bld 128 (*)    BUN 58 (*)    Creatinine, Ser 2.12 (*)    GFR, Estimated 24 (*)    All other components within normal limits  URINE CULTURE  LACTIC ACID, PLASMA    EKG None  Radiology DG Chest Portable 1 View  Result Date: 01/09/2021 CLINICAL DATA:  Unknown source of infection. EXAM: PORTABLE CHEST 1 VIEW COMPARISON:  11/20/2020 FINDINGS: Heart size is normal. There is patchy opacity in the LATERAL LEFT lung base, consistent with atelectasis or possible focal infiltrate. No pulmonary edema. IMPRESSION: LEFT LOWER lobe atelectasis or infiltrate. Electronically Signed   By: Nolon Nations M.D.   On:  01/09/2021 08:37    Procedures Procedures   Medications Ordered in ED Medications  lactated ringers bolus 500 mL (500 mLs Intravenous New Bag/Given 01/09/21 [id number])    ED Course  I have reviewed the triage vital signs and the nursing notes.  Pertinent labs & imaging results that were available during my care of the patient were reviewed by me and considered in my medical decision making (see chart for details).    MDM Rules/Calculators/A&P                         This patient presents to the ED for concern of persistent fever and weakness, this involves an extensive number of treatment options, and is a complaint that carries with it a high risk of complications and morbidity.  The differential diagnosis includes sepsis, pneumonia, UTI, intraadbominal infection, PE, stroke   Additional history obtained:  Additional history obtained from her two daughters, alternating turns at bedside External records from outside source obtained and reviewed including previous labs and discharge summaries    Lab Tests:  I Ordered, reviewed, and interpreted labs.  The pertinent results include: BMP unremarkable at baseline, CBC with white count of 20.5, urinalysis with small amounts of blood but no bacteria or white blood cells, lactic acid normal, urine culture without growth   Imaging Studies ordered:  I ordered imaging studies including chest x-ray and CT abdomen pelvis without contrast I independently visualized and interpreted imaging which showed: X-ray with left lower lobe infiltrate or atelectasis CT shows partial consolidation of the lingula.  No acute processes in the abdomen I agree with the radiologist interpretation   Cardiac Monitoring:  The patient was maintained on a cardiac monitor.  I personally viewed and interpreted the cardiac monitored which showed an underlying rhythm of: NSR   Medicines ordered and prescription drug management:  I ordered medication including 500  mL lactated Ringer bolus for suspected infection Reevaluation of the patient after these medicines showed that the patient stayed the same I have reviewed the patients home medicines and have made adjustments as needed   Dispostion:  After consideration of the diagnostic results and the patients response to treatment feel that the patent would benefit from discharge with outpatient treatment and follow-up. 1.  Community-acquired pneumonia-patient's urinalysis was unremarkable for UTI.  However   physical exam and imaging confirm source of infection to be pneumonia.  Patient has history of intermittent pneumonias since her stroke.  Given her consistently stable vitals and otherwise reassuring exam, do not believe patient needs to be admitted at this time.  I discussed this with her daughter and both are amenable to plan.  All the questions were asked and answered.  Discharge patient home with 5-day course of Levaquin given patient's numerous antibiotic allergies.  She is to follow-up with her primary care doctor at the end of her course of antibiotics to recheck x-ray and monitor resolution after treatment.  Discharged home in good condition. 2.  Bacterial vaginosis-patient has history of BV that responds well to metronidazole.  Discharged home with  intravaginal metronidazole cream to use as directed.  Patient and daughter agree and are amenable to plan. All labs and imaging were discussed with patient and family at bedside.  Final Clinical Impression(s) / ED Diagnoses Final diagnoses:  Community acquired pneumonia of left lower lobe of lung  Bacterial vaginosis    Rx / DC Orders ED Discharge Orders          Ordered    levofloxacin (LEVAQUIN) 750 MG tablet  Daily        01/09/21 1033    metroNIDAZOLE (METROGEL) 0.75 % vaginal gel  2 times daily        01/09/21 [address], [place] 01/09/21 1317    Truddie Hidden, MD 01/09/21 1426

## 2021-01-20 ENCOUNTER — Other Ambulatory Visit: Payer: Self-pay

## 2021-01-20 ENCOUNTER — Ambulatory Visit (INDEPENDENT_AMBULATORY_CARE_PROVIDER_SITE_OTHER): Payer: PPO | Admitting: Cardiology

## 2021-01-20 ENCOUNTER — Encounter (HOSPITAL_BASED_OUTPATIENT_CLINIC_OR_DEPARTMENT_OTHER): Payer: Self-pay | Admitting: Cardiology

## 2021-01-20 VITALS — BP 128/68 | HR 61 | Resp 20 | Ht 66.0 in | Wt 145.6 lb

## 2021-01-20 DIAGNOSIS — Z8673 Personal history of transient ischemic attack (TIA), and cerebral infarction without residual deficits: Secondary | ICD-10-CM | POA: Diagnosis not present

## 2021-01-20 DIAGNOSIS — N1832 Chronic kidney disease, stage 3b: Secondary | ICD-10-CM

## 2021-01-20 DIAGNOSIS — I6522 Occlusion and stenosis of left carotid artery: Secondary | ICD-10-CM

## 2021-01-20 DIAGNOSIS — I10 Essential (primary) hypertension: Secondary | ICD-10-CM | POA: Diagnosis not present

## 2021-01-20 DIAGNOSIS — E118 Type 2 diabetes mellitus with unspecified complications: Secondary | ICD-10-CM

## 2021-01-20 DIAGNOSIS — E7800 Pure hypercholesterolemia, unspecified: Secondary | ICD-10-CM | POA: Diagnosis not present

## 2021-01-20 NOTE — Progress Notes (Signed)
Cardiology Office Note:    Date:  01/21/2021   ID:  Chelsea [name], [place] 22-Jul-1945, MRN [id number]  PCP:  [name], FNP  Cardiologist:  Buford Dresser, MD  Referring MD: No ref. provider found   CC: follow up  History of Present Illness:    Chelsea [name] is a 76 y.o. female with a hx of CVA, cardiac arrest, carotid artery occlusion, diabetes mellitus without complication, hypertension, and stroke who is seen for follow up today. I initially met her 06/18/20 as a new consult at the request of No ref. provider found for the evaluation and management of uncontrolled hypertension.  Today: She is accompanied by her daughter, who also provides the history. She was recently in the hospital with pneumonia. She continues to have phlegm like "hot water" that is difficult to cough up. In the morning she usually produces sputum. On admission her kidney function was reportedly 2.12, and 2.68 this past Friday.  They deny any recurrent near-syncopal episodes as prior. Lately she has been feeling dizzy, but she believes this is due to one of her medications.  Usually her blood pressure has been controlled, but has been as low as 103/50. In clinic today her BP is 128/68.   Her weight was 141 lbs last Friday, and 145 in clinic today. She remains compliant with torsemide twice daily, and notes having adequate urine production at this time.  She has successfully made dietary changes including smaller meals. She is eating properly and drinking plenty of fluids.  She denies any palpitations, or chest pain. No headaches, orthopnea, PND, or exertional symptoms.  Past Medical History:  Diagnosis Date   Allergy    Anemia    Blind left eye    Cardiac arrest [facility])    Carotid artery occlusion    Cataract    Depression    Diabetes mellitus without complication ([place])    Hyperlipidemia    Hypertension    Oxygen deficiency    Stroke [facility])     Past Surgical History:  Procedure  Laterality Date   ENDARTERECTOMY Left 12/03/2019   Procedure: LEFT CAROTID ENDARTERECTOMY;  Surgeon: Angelia Mould, MD;  Location: [place];  Service: Vascular;  Laterality: Left;   PATCH ANGIOPLASTY Left 12/03/2019   Procedure: PATCH ANGIOPLASTY Left Carotid Artery;  Surgeon: Angelia Mould, MD;  Location: [facility] OR;  Service: Vascular;  Laterality: Left;    Current Medications: Current Outpatient Medications on File Prior to Visit  Medication Sig   acetaminophen (TYLENOL) 500 MG tablet Take 1,000 mg by mouth every 6 (six) hours as needed for moderate pain.   amLODipine (NORVASC) 10 MG tablet Take 1 tablet (10 mg total) by mouth daily.   carvedilol (COREG) 3.125 MG tablet Take 1 tablet (3.125 mg total) by mouth 2 (two) times daily with a meal.   clopidogrel (PLAVIX) 75 MG tablet TAKE 1 TABLET ONCE DAILY   Elastic Bandages & Supports (MEDICAL COMPRESSION SOCKS) MISC 1 Units by Does not apply route daily.   Ferrous Sulfate (IRON) 325 (65 Fe) MG TABS Take 325 mg by mouth every other day.   guaiFENesin (MUCINEX) 600 MG 12 hr tablet Take 1 tablet (600 mg total) by mouth 2 (two) times daily.   hydrALAZINE (APRESOLINE) 25 MG tablet Take 1 tablet (25 mg total) by mouth 3 (three) times daily.   metroNIDAZOLE (METROGEL) 0.75 % vaginal gel Place 1 Applicatorful vaginally 2 (two) times daily.   rosuvastatin (CRESTOR) 5 MG tablet Take 5  mg every other day for 2 weeks, then increase to 5 mg daily. (Patient taking differently: Take 5 mg by mouth daily.)   tamsulosin (FLOMAX) 0.4 MG CAPS capsule Take 0.4 mg by mouth every evening.   torsemide (DEMADEX) 20 MG tablet Take 20 mg by mouth 2 (two) times daily.   No current facility-administered medications on file prior to visit.     Allergies:   Codeine, Latex, Penicillins, Sulfa antibiotics, Benzodiazepines, Lidocaine, Oxycodone-acetaminophen, Trazodone, Cephalexin, Gabapentin, Lipitor [atorvastatin], and Lyrica [pregabalin]   Social History    Tobacco Use   Smoking status: Former    Types: Cigarettes   Smokeless tobacco: Never  Vaping Use   Vaping Use: Never used  Substance Use Topics   Alcohol use: Not Currently   Drug use: Never    Family History: family history includes Arthritis in her brother, daughter, father, mother, and sister; COPD in her sister; Cancer in her mother; Depression in her brother, daughter, sister, and son; Diabetes in her brother, daughter, father, mother, sister, and son; Heart attack in her mother and sister; Heart disease in her brother and sister; Hyperlipidemia in her father and mother; Hypertension in her brother, daughter, father, mother, and sister; Stroke in her brother, daughter, and father.  ROS:   Please see the history of present illness.   (+) Dizziness (+) Sputum production Additional pertinent ROS otherwise unremarkable.   EKGs/Labs/Other Studies Reviewed:    The following studies were reviewed today:  Bilateral Carotid Duplex 10/30/2020: Summary:  Right Carotid: Velocities in the right ICA are consistent with a 1-39%  stenosis.   Left Carotid: There is no evidence of stenosis in the left ICA.   Vertebrals:  Right vertebral artery demonstrates antegrade flow. Left  vertebral artery was not visualized.   Subclavians: Normal flow hemodynamics were seen in bilateral subclavian arteries.   Echo 10/07/20 1. Peak LV outflow tract velocity 2.7 m/s, peak gradient 29 mmHg (no  significant obstruction). Left ventricular ejection fraction, by  estimation, is 70 to 75%. The left ventricle has hyperdynamic function.  The left ventricle has no regional wall motion  abnormalities. There is mild left ventricular hypertrophy. Left  ventricular diastolic parameters are consistent with Grade I diastolic  dysfunction (impaired relaxation).   2. Right ventricular systolic function is normal. The right ventricular  size is normal.   3. The mitral valve is normal in structure. No evidence  of mitral valve  regurgitation. No evidence of mitral stenosis. Moderate mitral annular  calcification.   4. The aortic valve is tricuspid. Aortic valve regurgitation is not  visualized. Mild aortic valve sclerosis is present, with no evidence of  aortic valve stenosis.   5. The inferior vena cava is normal in size with greater than 50%  respiratory variability, suggesting right atrial pressure of 3 mmHg.   [place] LE Venous 12/12/2019: RIGHT:  - There is no evidence of deep vein thrombosis in the lower extremity.  - No cystic structure found in the popliteal fossa.     LEFT:  - No evidence of common femoral vein obstruction.  Echo 11/07/2019:  1. Left ventricular ejection fraction, by estimation, is 60 to 65%. The  left ventricle has normal function. The left ventricle has no regional  wall motion abnormalities. There is mild asymmetric left ventricular  hypertrophy of the basal-septal segment.  Left ventricular diastolic parameters are consistent with Grade I  diastolic dysfunction (impaired relaxation). Elevated left ventricular  end-diastolic pressure.   2. Right ventricular systolic function is  normal. The right ventricular  size is normal.   3. Left atrial size was moderately dilated.   4. The mitral valve is normal in structure. Trivial mitral valve  regurgitation. No evidence of mitral stenosis. Moderate mitral annular  calcification.   5. The aortic valve is tricuspid. There is mild calcification of the  aortic valve. There is mild thickening of the aortic valve. Aortic valve  regurgitation is not visualized. Mild aortic valve sclerosis is present,  with no evidence of aortic valve  stenosis.   6. The inferior vena cava is normal in size with greater than 50%  respiratory variability, suggesting right atrial pressure of 3 mmHg.   7. Agitated saline contrast bubble study was negative, with no evidence  of any interatrial shunt.   Comparison(s): No prior Echocardiogram.    Conclusion(s)/Recommendation(s): Normal biventricular function without  evidence of hemodynamically significant valvular heart disease. No  intracardiac source of embolism detected on this transthoracic study. A  transesophageal echocardiogram is  recommended to exclude cardiac source of embolism if clinically indicated.   US Carotid Duplex 11/02/2019: Summary:  Right Carotid: Velocities in the right ICA are consistent with a 1-39%  stenosis.   Left Carotid: Velocities in the left ICA are consistent with a 80-99%  stenosis.   Vertebrals:  Bilateral vertebral arteries demonstrate antegrade flow.  Subclavians: Normal flow hemodynamics were seen in bilateral subclavian arteries.   EKG:  EKG is personally reviewed.   01/20/2021: EKG was not ordered. 06/18/2020: sinus bradycardia at 59 bpm, nonspecific t wave pattern  Recent Labs: 09/29/2020: ALT 8 10/09/2020: Magnesium 1.9 01/08/2021: BUN 58; Creatinine, Ser 2.12; Hemoglobin 11.7; Platelets 307; Potassium 4.0; Sodium 134   Recent Lipid Panel    Component Value Date/Time   CHOL 173 11/08/2019 0406   TRIG 82 11/08/2019 0406   HDL 36 (L) 11/08/2019 0406   CHOLHDL 4.8 11/08/2019 0406   VLDL 16 11/08/2019 0406   LDLCALC 121 (H) 11/08/2019 0406    Physical Exam:    VS:  BP 128/68 (BP Location: Left Arm, Patient Position: Sitting, Cuff Size: Normal)    Pulse 61    Resp 20    Ht 5' 6"  (1.676 m)    Wt 145 lb 9.6 oz (66 kg)    SpO2 96%    BMI 23.50 kg/m     Wt Readings from Last 3 Encounters:  01/20/21 145 lb 9.6 oz (66 kg)  01/09/21 139 lb 4.8 oz (63.2 kg)  11/27/20 139 lb 4.8 oz (63.2 kg)    GEN: Well nourished, well developed in no acute distress HEENT: Normal, moist mucous membranes NECK: No JVD CARDIAC: regular rhythm, normal S1 and S2, no rubs or gallops. No significant murmur appreciated today VASCULAR: Radial and DP pulses 2+ bilaterally. +left carotid bruit RESPIRATORY:  Clear to auscultation without wheezing or rhonchi,  except for diminished breath sounds slightly at right base ABDOMEN: Soft, non-tender, non-distended MUSCULOSKELETAL:  Moves all 4 limbs independently, in wheelchair SKIN: Warm and dry, no LE edema, compression stockings in place NEUROLOGIC:  Alert and oriented x 3. No focal neuro deficits noted. PSYCHIATRIC:  Normal affect    ASSESSMENT:    1. History of CVA (cerebrovascular accident)   2. Carotid stenosis, left   3. Pure hypercholesterolemia   4. Essential hypertension   5. Stage 3b chronic kidney disease ([place])   6. Type 2 diabetes mellitus with complication, without long-term current use of insulin ([place])     PLAN:    History  of CVA Carotid stenosis, left Hypercholesterolemia -continue clopidogrel -last LDL 121, goal <70 -she is currently on rosuvastatin 5 mg daily. We have discussed intensifying her regimen in the past, continue to discuss  Hypertension, labile -her BP is reasonable today with no recent syncope -has a history of both very low and very high pressures, need to be cautious with med changes -continue amlodipine, carvedilol, torsemide, lisinopril, hydralazine  Type II diabetes Chronic kidney disease, stage 3b (GFR 39) Former smoker -no edema on torsemide. Given CKD, continue daily dose with PRN second daily dose for edema -With frequent UTIs, SGLT2i not ideal. Considered GLP1RA, though she has had issues with lack of appetite/weight loss, so would not start at this time. -given good control of sugars in the hospital, and renal disease, I would favor monitoring sugars and not starting meds unless they rise significantly. .  Cardiac risk counseling and prevention recommendations: -recommend heart healthy/Mediterranean diet, with whole grains, fruits, vegetable, fish, lean meats, nuts, and olive oil. Limit salt. -limited mobility, cannot walk for significant distance -recommend avoidance of tobacco products. Avoid excess alcohol.  Plan for follow up: 2 months or  sooner as needed.  Buford Dresser, MD, PhD, [facility]    Medication Adjustments/Labs and Tests Ordered: Current medicines are reviewed at length with the patient today.  Concerns regarding medicines are outlined above.   No orders of the defined types were placed in this encounter.  No orders of the defined types were placed in this encounter.  Patient Instructions  Medication Instructions: With your change in kidney function, I would stop your lisinopril for now. If on blood work recheck, the kidney function is still abnormal, we may need to decrease the torsemide to once a day and use the second dose only for weight gain/worsening swelling/shortness of breath.  *If you need a refill on your cardiac medications before your next appointment, please call your pharmacy*   Lab Work: None If you have labs (blood work) drawn today and your tests are completely normal, you will receive your results only by: [place] (if you have MyChart) OR A paper copy in the mail If you have any lab test that is abnormal or we need to change your treatment, we will call you to review the results.    Follow-Up: At [facility], you and your health needs are our priority.  As part of our continuing mission to provide you with exceptional heart care, we have created designated Provider Care Teams.  These Care Teams include your primary Cardiologist (physician) and Advanced Practice Providers (APPs -  Physician Assistants and Nurse Practitioners) who all work together to provide you with the care you need, when you need it.   Your next appointment:   2 month(s), 03/17/2021 at 9:20 AM  The format for your next appointment:   In Person  Provider:   Buford Dresser, MD{   I,Mathew Stumpf,acting as a scribe for Buford Dresser, MD.,have documented all relevant documentation on the behalf of Buford Dresser, MD,as directed by  Buford Dresser,  MD while in the presence of Buford Dresser, MD.  I, Buford Dresser, MD, have reviewed all documentation for this visit. The documentation on 01/21/21 for the exam, diagnosis, procedures, and orders are all accurate and complete.   Signed, Buford Dresser, MD PhD 01/21/2021 8:44 AM    [place]

## 2021-01-20 NOTE — Patient Instructions (Addendum)
Medication Instructions: With your change in kidney function, I would stop your lisinopril for now. If on blood work recheck, the kidney function is still abnormal, we may need to decrease the torsemide to once a day and use the second dose only for weight gain/worsening swelling/shortness of breath.  *If you need a refill on your cardiac medications before your next appointment, please call your pharmacy*   Lab Work: None If you have labs (blood work) drawn today and your tests are completely normal, you will receive your results only by: MyChart Message (if you have MyChart) OR A paper copy in the mail If you have any lab test that is abnormal or we need to change your treatment, we will call you to review the results.    Follow-Up: At [facility], you and your health needs are our priority.  As part of our continuing mission to provide you with exceptional heart care, we have created designated Provider Care Teams.  These Care Teams include your primary Cardiologist (physician) and Advanced Practice Providers (APPs -  Physician Assistants and Nurse Practitioners) who all work together to provide you with the care you need, when you need it.   Your next appointment:   2 month(s), 03/17/2021 at 9:20 AM  The format for your next appointment:   In Person  Provider:   Jodelle Red, MD{

## 2021-02-03 ENCOUNTER — Other Ambulatory Visit (HOSPITAL_BASED_OUTPATIENT_CLINIC_OR_DEPARTMENT_OTHER): Payer: Self-pay | Admitting: Family

## 2021-02-05 ENCOUNTER — Other Ambulatory Visit: Payer: Self-pay | Admitting: Family Medicine

## 2021-02-05 ENCOUNTER — Ambulatory Visit
Admission: RE | Admit: 2021-02-05 | Discharge: 2021-02-05 | Disposition: A | Discharge status: Home / Self Care | Payer: PPO | Source: Ambulatory Visit | Attending: Family Medicine | Admitting: Family Medicine

## 2021-02-05 DIAGNOSIS — J189 Pneumonia, unspecified organism: Secondary | ICD-10-CM

## 2021-03-17 ENCOUNTER — Other Ambulatory Visit: Payer: Self-pay

## 2021-03-17 ENCOUNTER — Ambulatory Visit (INDEPENDENT_AMBULATORY_CARE_PROVIDER_SITE_OTHER): Payer: PPO | Admitting: Cardiology

## 2021-03-17 VITALS — BP 182/70 | HR 59 | Ht 66.0 in | Wt 143.2 lb

## 2021-03-17 DIAGNOSIS — E7800 Pure hypercholesterolemia, unspecified: Secondary | ICD-10-CM

## 2021-03-17 DIAGNOSIS — Z8673 Personal history of transient ischemic attack (TIA), and cerebral infarction without residual deficits: Secondary | ICD-10-CM

## 2021-03-17 DIAGNOSIS — N1832 Chronic kidney disease, stage 3b: Secondary | ICD-10-CM

## 2021-03-17 DIAGNOSIS — R42 Dizziness and giddiness: Secondary | ICD-10-CM

## 2021-03-17 DIAGNOSIS — I10 Essential (primary) hypertension: Secondary | ICD-10-CM | POA: Diagnosis not present

## 2021-03-17 NOTE — Progress Notes (Signed)
Cardiology Office Note:    Date:  03/19/2021   ID:  Chelsea [name], DOB 01/26/1945, MRN [id number]  PCP:  [name], MD  Cardiologist:  Buford Dresser, MD  Referring MD: No ref. provider found   CC: follow up  History of Present Illness:    Chelsea [name] is a 76 y.o. female with a hx of CVA, cardiac arrest, carotid artery occlusion, diabetes mellitus without complication, hypertension, and stroke who is seen for follow up today. I initially met her 06/18/20 as a new consult at the request of No ref. provider found for the evaluation and management of uncontrolled hypertension.  Today: She is accompanied by her 2 daughters. Recently, she has noticed episodes of dizziness. She describes the dizziness as lightheadedness and feeling off balance. The episodes occur daily and randomly. She can feel lightheaded when laying down or while walking. The episodes prevent her from being active because she feels she might fall. Watching television will also cause her to become dizzy. She has seen her PCP and they told her the dizziness was not due to water in her ears.  One of her daughters reports the worst symptoms after her stroke was dizziness and nausea. She was unable to perform physical therapy without feeling nauseous and vomiting. After some time, the symptoms improved and she was able to walk regularly without issues. However, she did not have similar severe episodes of dizziness until recently.  She was told to reduce her water consumption to prevent over-working her kidneys. Her nephrologist is closely following her kidney function and medications. She is tolerating her medication changes. Her daughter tried pausing some of her medications. However, her symptoms did not improve. She does not regularly check her blood pressure at home but has not noticed recent blood pressure measurements of <734 systolic.   Occasionally, her bilateral LE will swell. She reports the swelling is  improved with medications.   She reports her appetite is good. She walks as much as she can. Recently, she has to have someone walk with her because of the dizziness.   Denies chest pain, shortness of breath at rest or with normal exertion. No PND, orthopnea, or unexpected weight gain. No syncope or palpitations.  Past Medical History:  Diagnosis Date   Allergy    Anemia    Blind left eye    Cardiac arrest [facility])    Carotid artery occlusion    Cataract    Depression    Diabetes mellitus without complication ([place])    Hyperlipidemia    Hypertension    Oxygen deficiency    Stroke [facility])     Past Surgical History:  Procedure Laterality Date   ENDARTERECTOMY Left 12/03/2019   Procedure: LEFT CAROTID ENDARTERECTOMY;  Surgeon: Angelia Mould, MD;  Location: [place];  Service: Vascular;  Laterality: Left;   PATCH ANGIOPLASTY Left 12/03/2019   Procedure: PATCH ANGIOPLASTY Left Carotid Artery;  Surgeon: Angelia Mould, MD;  Location: [facility] OR;  Service: Vascular;  Laterality: Left;    Current Medications: Current Outpatient Medications on File Prior to Visit  Medication Sig   acetaminophen (TYLENOL) 500 MG tablet Take 1,000 mg by mouth every 6 (six) hours as needed for moderate pain.   amLODipine (NORVASC) 5 MG tablet Take 5 mg by mouth daily.   carvedilol (COREG) 3.125 MG tablet Take 1 tablet (3.125 mg total) by mouth 2 (two) times daily with a meal.   clopidogrel (PLAVIX) 75 MG tablet TAKE 1 TABLET ONCE  DAILY   Elastic Bandages & Supports (MEDICAL COMPRESSION SOCKS) MISC 1 Units by Does not apply route daily.   Ferrous Sulfate (IRON) 325 (65 Fe) MG TABS Take 325 mg by mouth every other day.   guaiFENesin (MUCINEX) 600 MG 12 hr tablet Take 1 tablet (600 mg total) by mouth 2 (two) times daily.   metroNIDAZOLE (METROGEL) 0.75 % vaginal gel Place 1 Applicatorful vaginally 2 (two) times daily.   olmesartan (BENICAR) 20 MG tablet Take 1 tablet by mouth daily.   rosuvastatin  (CRESTOR) 5 MG tablet Take 5 mg by mouth at bedtime.   tamsulosin (FLOMAX) 0.4 MG CAPS capsule Take 0.4 mg by mouth every evening.   torsemide (DEMADEX) 20 MG tablet Take 40 mg by mouth daily.   No current facility-administered medications on file prior to visit.     Allergies:   Codeine, Latex, Penicillins, Sulfa antibiotics, Benzodiazepines, Lidocaine, Oxycodone-acetaminophen, Trazodone, Cephalexin, Gabapentin, Lipitor [atorvastatin], and Lyrica [pregabalin]   Social History   Tobacco Use   Smoking status: Former    Types: Cigarettes   Smokeless tobacco: Never  Vaping Use   Vaping Use: Never used  Substance Use Topics   Alcohol use: Not Currently   Drug use: Never    Family History: family history includes Arthritis in her brother, daughter, father, mother, and sister; COPD in her sister; Cancer in her mother; Depression in her brother, daughter, sister, and son; Diabetes in her brother, daughter, father, mother, sister, and son; Heart attack in her mother and sister; Heart disease in her brother and sister; Hyperlipidemia in her father and mother; Hypertension in her brother, daughter, father, mother, and sister; Stroke in her brother, daughter, and father.  ROS:   Please see the history of present illness.   (+) Dizziness/lightheadedness (+) Poor balance (+) LE edema Additional pertinent ROS otherwise unremarkable.  EKGs/Labs/Other Studies Reviewed:    The following studies were reviewed today: Bilateral Carotid Duplex 10/30/2020: Summary:  Right Carotid: Velocities in the right ICA are consistent with a 1-39%  stenosis.   Left Carotid: There is no evidence of stenosis in the left ICA.   Vertebrals:  Right vertebral artery demonstrates antegrade flow. Left  vertebral artery was not visualized.   Subclavians: Normal flow hemodynamics were seen in bilateral subclavian arteries.   Echo 10/07/20 1. Peak LV outflow tract velocity 2.7 m/s, peak gradient 29 mmHg (no   significant obstruction). Left ventricular ejection fraction, by  estimation, is 70 to 75%. The left ventricle has hyperdynamic function.  The left ventricle has no regional wall motion  abnormalities. There is mild left ventricular hypertrophy. Left  ventricular diastolic parameters are consistent with Grade I diastolic  dysfunction (impaired relaxation).   2. Right ventricular systolic function is normal. The right ventricular  size is normal.   3. The mitral valve is normal in structure. No evidence of mitral valve  regurgitation. No evidence of mitral stenosis. Moderate mitral annular  calcification.   4. The aortic valve is tricuspid. Aortic valve regurgitation is not  visualized. Mild aortic valve sclerosis is present, with no evidence of  aortic valve stenosis.   5. The inferior vena cava is normal in size with greater than 50%  respiratory variability, suggesting right atrial pressure of 3 mmHg.   [place] LE Venous 12/12/2019: RIGHT:  - There is no evidence of deep vein thrombosis in the lower extremity.  - No cystic structure found in the popliteal fossa.     LEFT:  - No evidence of  common femoral vein obstruction.  Echo 11/07/2019:  1. Left ventricular ejection fraction, by estimation, is 60 to 65%. The  left ventricle has normal function. The left ventricle has no regional  wall motion abnormalities. There is mild asymmetric left ventricular  hypertrophy of the basal-septal segment.  Left ventricular diastolic parameters are consistent with Grade I  diastolic dysfunction (impaired relaxation). Elevated left ventricular  end-diastolic pressure.   2. Right ventricular systolic function is normal. The right ventricular  size is normal.   3. Left atrial size was moderately dilated.   4. The mitral valve is normal in structure. Trivial mitral valve  regurgitation. No evidence of mitral stenosis. Moderate mitral annular  calcification.   5. The aortic valve is tricuspid. There  is mild calcification of the  aortic valve. There is mild thickening of the aortic valve. Aortic valve  regurgitation is not visualized. Mild aortic valve sclerosis is present,  with no evidence of aortic valve  stenosis.   6. The inferior vena cava is normal in size with greater than 50%  respiratory variability, suggesting right atrial pressure of 3 mmHg.   7. Agitated saline contrast bubble study was negative, with no evidence  of any interatrial shunt.   Comparison(s): No prior Echocardiogram.   Conclusion(s)/Recommendation(s): Normal biventricular function without  evidence of hemodynamically significant valvular heart disease. No  intracardiac source of embolism detected on this transthoracic study. A  transesophageal echocardiogram is  recommended to exclude cardiac source of embolism if clinically indicated.   US Carotid Duplex 11/02/2019: Summary:  Right Carotid: Velocities in the right ICA are consistent with a 1-39%  stenosis.   Left Carotid: Velocities in the left ICA are consistent with a 80-99%  stenosis.   Vertebrals:  Bilateral vertebral arteries demonstrate antegrade flow.  Subclavians: Normal flow hemodynamics were seen in bilateral subclavian arteries.   EKG:  EKG was not ordered today  03/17/2021: ECG not ordered today 01/20/2021: EKG was not ordered. 06/18/2020: sinus bradycardia at 59 bpm, nonspecific t wave pattern  Recent Labs: 09/29/2020: ALT 8 10/09/2020: Magnesium 1.9 01/08/2021: BUN 58; Creatinine, Ser 2.12; Hemoglobin 11.7; Platelets 307; Potassium 4.0; Sodium 134   Recent Lipid Panel    Component Value Date/Time   CHOL 173 11/08/2019 0406   TRIG 82 11/08/2019 0406   HDL 36 (L) 11/08/2019 0406   CHOLHDL 4.8 11/08/2019 0406   VLDL 16 11/08/2019 0406   LDLCALC 121 (H) 11/08/2019 0406    Physical Exam:    VS:  BP (!) 182/70 (BP Location: Left Arm, Patient Position: Sitting)    Pulse (!) 59    Ht _0  (1.676 m)    Wt 143 lb 3.2 oz (65 kg)    SpO2  99%    BMI 23.11 kg/m   Right arm 164/86  Wt Readings from Last 3 Encounters:  03/17/21 143 lb 3.2 oz (65 kg)  01/20/21 145 lb 9.6 oz (66 kg)  01/09/21 139 lb 4.8 oz (63.2 kg)   GEN: Well nourished, well developed in no acute distress HEENT: Normal, moist mucous membranes NECK: No JVD CARDIAC: regular rhythm, normal S1 and S2, no rubs or gallops. No murmur. VASCULAR: Radial and DP pulses 2+ bilaterally. No carotid bruits RESPIRATORY:  Clear to auscultation without rales, wheezing or rhonchi  ABDOMEN: Soft, non-tender, non-distended MUSCULOSKELETAL:  Ambulates independently but using wheelchair SKIN: Warm and dry, no edema NEUROLOGIC:  Alert and oriented x 3. No focal neuro deficits noted. PSYCHIATRIC:  Normal affect   ASSESSMENT:  1. Lightheadedness   2. History of CVA (cerebrovascular accident)   3. Essential hypertension   4. Stage 3b chronic kidney disease ([place])   5. Pure hypercholesterolemia      PLAN:    Lightheadedness/dizziness:  -no clear cardiac etiology for this. Similar to symptoms she had with her prior stroke, but had a long period of time without issues. No clear cause for recrudescence. I recommended they discuss with their neurology team if additional testing needs to be done  History of CVA Carotid stenosis, left Hypercholesterolemia -continue clopidogrel -last LDL 121, goal <70 -she is currently on rosuvastatin 5 mg daily. We have discussed intensifying her regimen in the past, continue to discuss  Hypertension, labile -her BP is elevated today, but she feels poorly. With dizziness/lightheadedness (above), will monitor for now and avoid making changes. They will contact me if remains consistently elevated -has a history of both very low and very high pressures, need to be cautious with med changes -continue amlodipine, carvedilol, torsemide, lisinopril, hydralazine  Type II diabetes Chronic kidney disease, stage 3b (GFR 39) Former smoker -no edema  on torsemide. Given CKD, continue daily dose with PRN second daily dose for edema -With frequent UTIs, SGLT2i not ideal. Considered GLP1RA, though she has had issues with lack of appetite/weight loss, so would not start at this time.  Cardiac risk counseling and prevention recommendations: -recommend heart healthy/Mediterranean diet, with whole grains, fruits, vegetable, fish, lean meats, nuts, and olive oil. Limit salt. -limited mobility, cannot walk for significant distance -recommend avoidance of tobacco products. Avoid excess alcohol.  Plan for follow up: 2 months or sooner as needed after seeing neurology.  Buford Dresser, MD, PhD, [facility]    Medication Adjustments/Labs and Tests Ordered: Current medicines are reviewed at length with the patient today.  Concerns regarding medicines are outlined above.   No orders of the defined types were placed in this encounter.  No orders of the defined types were placed in this encounter.  Patient Instructions  Medication Instructions:  Your Physician recommend you continue on your current medication as directed.    *If you need a refill on your cardiac medications before your next appointment, please call your pharmacy*   Lab Work: None ordered today   Testing/Procedures: None ordered today   Follow-Up: At [facility], you and your health needs are our priority.  As part of our continuing mission to provide you with exceptional heart care, we have created designated Provider Care Teams.  These Care Teams include your primary Cardiologist (physician) and Advanced Practice Providers (APPs -  Physician Assistants and Nurse Practitioners) who all work together to provide you with the care you need, when you need it.  We recommend signing up for the patient portal called "MyChart".  Sign up information is provided on this After Visit Summary.  MyChart is used to connect with patients for Virtual Visits  (Telemedicine).  Patients are able to view lab/test results, encounter notes, upcoming appointments, etc.  Non-urgent messages can be sent to your provider as well.   To learn more about what you can do with MyChart, go to [email or web address].    Your next appointment:   May 25, 2021 @ 11:40 am  The format for your next appointment:   In Person  Provider:   Buford Dresser, MD{      Wilhemina Bonito as a scribe for Buford Dresser, MD.,have documented all relevant documentation on the behalf of Buford Dresser, MD,as directed  by  Buford Dresser, MD while in the presence of Buford Dresser, MD.  I, Buford Dresser, MD, have reviewed all documentation for this visit. The documentation on 03/19/21 for the exam, diagnosis, procedures, and orders are all accurate and complete.   Signed, Buford Dresser, MD PhD 03/19/2021 5:08 PM    [place]

## 2021-03-17 NOTE — Patient Instructions (Addendum)
Medication Instructions:  ?Your Physician recommend you continue on your current medication as directed.   ? ?*If you need a refill on your cardiac medications before your next appointment, please call your pharmacy* ? ? ?Lab Work: ?None ordered today ? ? ?Testing/Procedures: ?None ordered today ? ? ?Follow-Up: ?At [facility], you and your health needs are our priority.  As part of our continuing mission to provide you with exceptional heart care, we have created designated Provider Care Teams.  These Care Teams include your primary Cardiologist (physician) and Advanced Practice Providers (APPs -  Physician Assistants and Nurse Practitioners) who all work together to provide you with the care you need, when you need it. ? ?We recommend signing up for the patient portal called "MyChart".  Sign up information is provided on this After Visit Summary.  MyChart is used to connect with patients for Virtual Visits (Telemedicine).  Patients are able to view lab/test results, encounter notes, upcoming appointments, etc.  Non-urgent messages can be sent to your provider as well.   ?To learn more about what you can do with MyChart, go to [email or web address].   ? ?Your next appointment:   ?May 25, 2021 @ 11:40 am ? ?The format for your next appointment:   ?In Person ? ?Provider:   ?Jodelle Red, MD{ ? ? ? ?

## 2021-03-19 ENCOUNTER — Encounter (HOSPITAL_BASED_OUTPATIENT_CLINIC_OR_DEPARTMENT_OTHER): Payer: Self-pay | Admitting: Cardiology

## 2021-03-23 ENCOUNTER — Ambulatory Visit (HOSPITAL_BASED_OUTPATIENT_CLINIC_OR_DEPARTMENT_OTHER): Payer: PPO | Admitting: Cardiovascular Disease

## 2021-03-23 NOTE — Telephone Encounter (Signed)
`  Prior Authorization for NPSG sent to [facility] via Fax [phone]. 

## 2021-03-25 ENCOUNTER — Telehealth: Payer: Self-pay | Admitting: Cardiology

## 2021-03-25 ENCOUNTER — Other Ambulatory Visit (HOSPITAL_BASED_OUTPATIENT_CLINIC_OR_DEPARTMENT_OTHER): Payer: Self-pay | Admitting: Family

## 2021-03-25 DIAGNOSIS — I10 Essential (primary) hypertension: Secondary | ICD-10-CM

## 2021-03-25 MED ORDER — AMLODIPINE BESYLATE 5 MG PO TABS: 5.0000 mg | ORAL_TABLET | Freq: Every day | ORAL | 3 refills | Status: DC

## 2021-03-25 NOTE — Telephone Encounter (Signed)
?*  STAT* If patient is at the pharmacy, call can be transferred to refill team. ? ? ?1. Which medications need to be refilled? (please list name of each medication and dose if known) amLODipine (NORVASC) 5 MG tablet ? ?2. Which pharmacy/location (including street and city if local pharmacy) is medication to be sent to? [place], [place] ? ?3. Do they need a 30 day or 90 day supply? 90 day supply  ?

## 2021-03-31 NOTE — Addendum Note (Signed)
Addended by: [name] on: 03/31/2021 10:15 AM ? ? Modules accepted: Orders ? ?

## 2021-03-31 NOTE — Telephone Encounter (Signed)
Prior Authorization for NPSG sent to [facility] via Fax is APPROVED AUTH # [id number] , VALID  ?DATES  04-11-21 / 07-10-21 ?

## 2021-04-06 NOTE — Telephone Encounter (Signed)
Patient is scheduled for lab study on 05/20/21. ?Patient understands her sleep study will be done at [facility]. ?Patient understands she will receive a sleep packet in a week or so. ?Patient understands to call if she does not receive the sleep packet in a timely manner. ?Patient agrees with treatment and thanked me for call. ?

## 2021-04-16 ENCOUNTER — Telehealth: Payer: Self-pay | Admitting: Adult Health

## 2021-04-16 NOTE — Telephone Encounter (Signed)
Pt's daughter(on DPR) states pt is almost confined to bed as a result of the dizziness from her stroke.  Daughter states pt was able to walk to the bathroom on her on but no longer is able to do so.  Family would not be able to get pt here for [facility] NP's slot today and do not want to wait until Dr Clydene Fake next available in Aug.  Daughter is asking for a call, also daughter declined a my chart vv today with Janett Billow.  She said they would prefer to bring pt in. Pt's PCP suggested pt see Neurologist.  ?

## 2021-04-22 ENCOUNTER — Other Ambulatory Visit: Payer: Self-pay | Admitting: Family Medicine

## 2021-04-22 DIAGNOSIS — H539 Unspecified visual disturbance: Secondary | ICD-10-CM

## 2021-04-22 DIAGNOSIS — R42 Dizziness and giddiness: Secondary | ICD-10-CM

## 2021-04-27 ENCOUNTER — Other Ambulatory Visit: Payer: PPO

## 2021-05-01 ENCOUNTER — Other Ambulatory Visit: Payer: Self-pay | Admitting: Family Medicine

## 2021-05-01 ENCOUNTER — Ambulatory Visit
Admission: RE | Admit: 2021-05-01 | Discharge: 2021-05-01 | Disposition: A | Discharge status: Home / Self Care | Payer: PPO | Source: Ambulatory Visit | Attending: Family Medicine | Admitting: Family Medicine

## 2021-05-01 DIAGNOSIS — R42 Dizziness and giddiness: Secondary | ICD-10-CM

## 2021-05-01 DIAGNOSIS — R930 Abnormal findings on diagnostic imaging of skull and head, not elsewhere classified: Secondary | ICD-10-CM

## 2021-05-01 DIAGNOSIS — H539 Unspecified visual disturbance: Secondary | ICD-10-CM

## 2021-05-02 IMAGING — DX DG SHOULDER 2+V*L*
1 series · 3 of 3 positions shown · non-contrast
Comparison: None.

CLINICAL DATA: Left shoulder neuropathic pain.  No reported injury.

EXAM:
LEFT SHOULDER - 2+ VIEW

[Series 1: shoulder · 0.14mm/px · 3 of 3 slices shown]
[im 1/3]
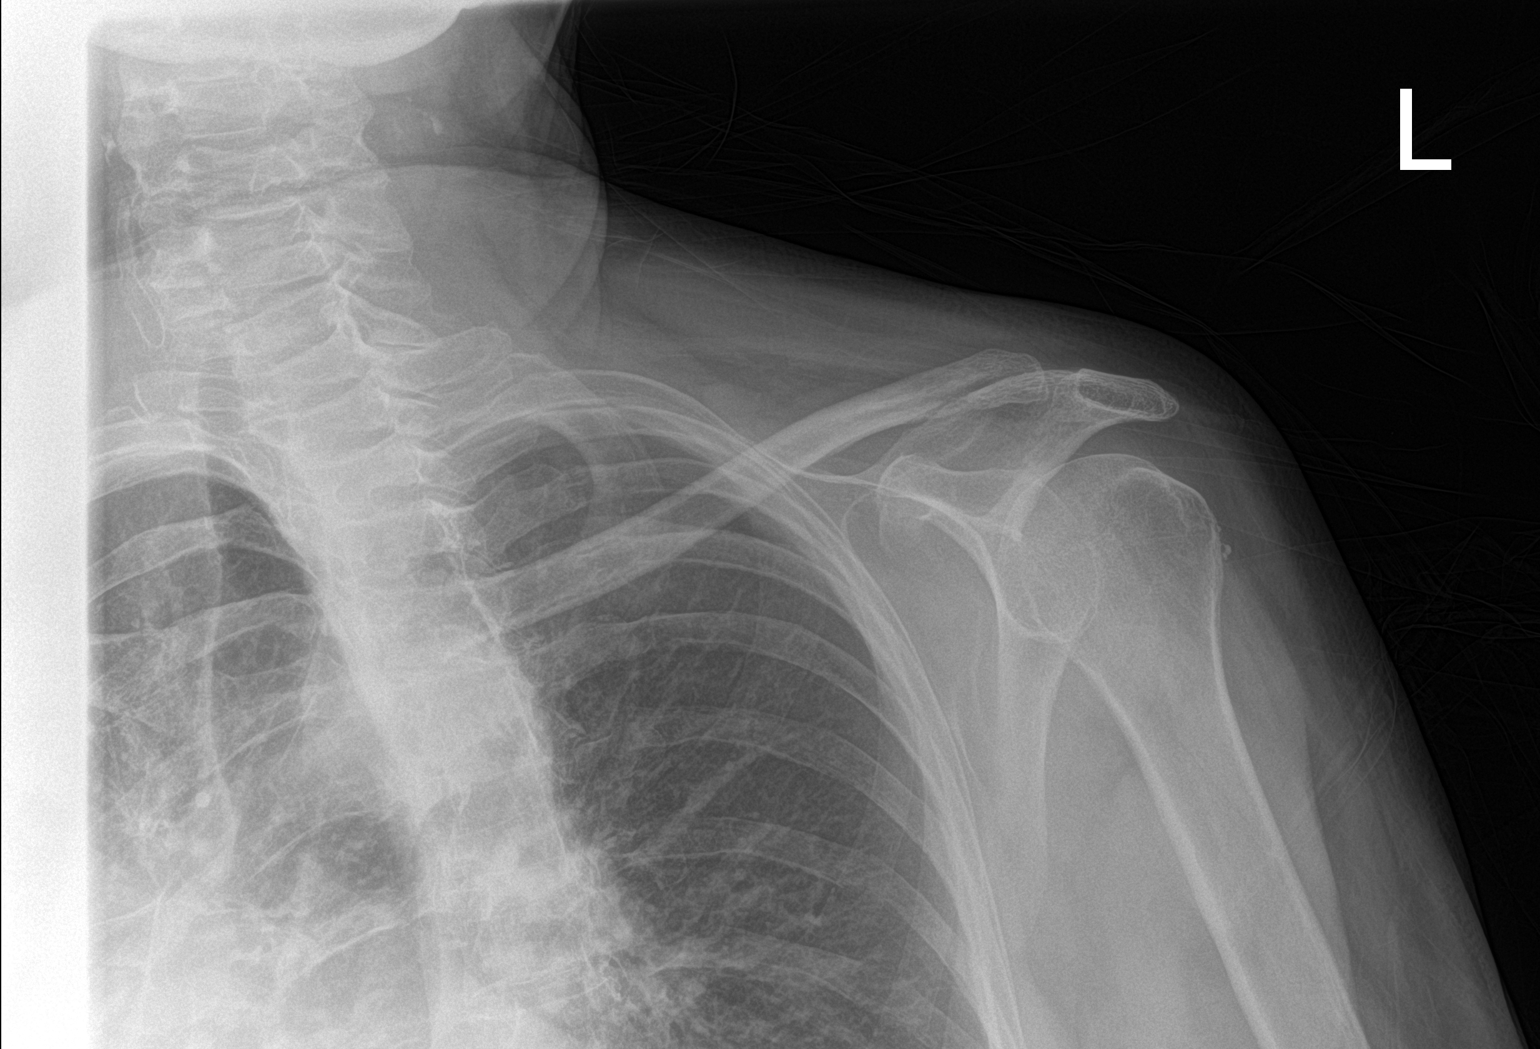
[im 2/3]
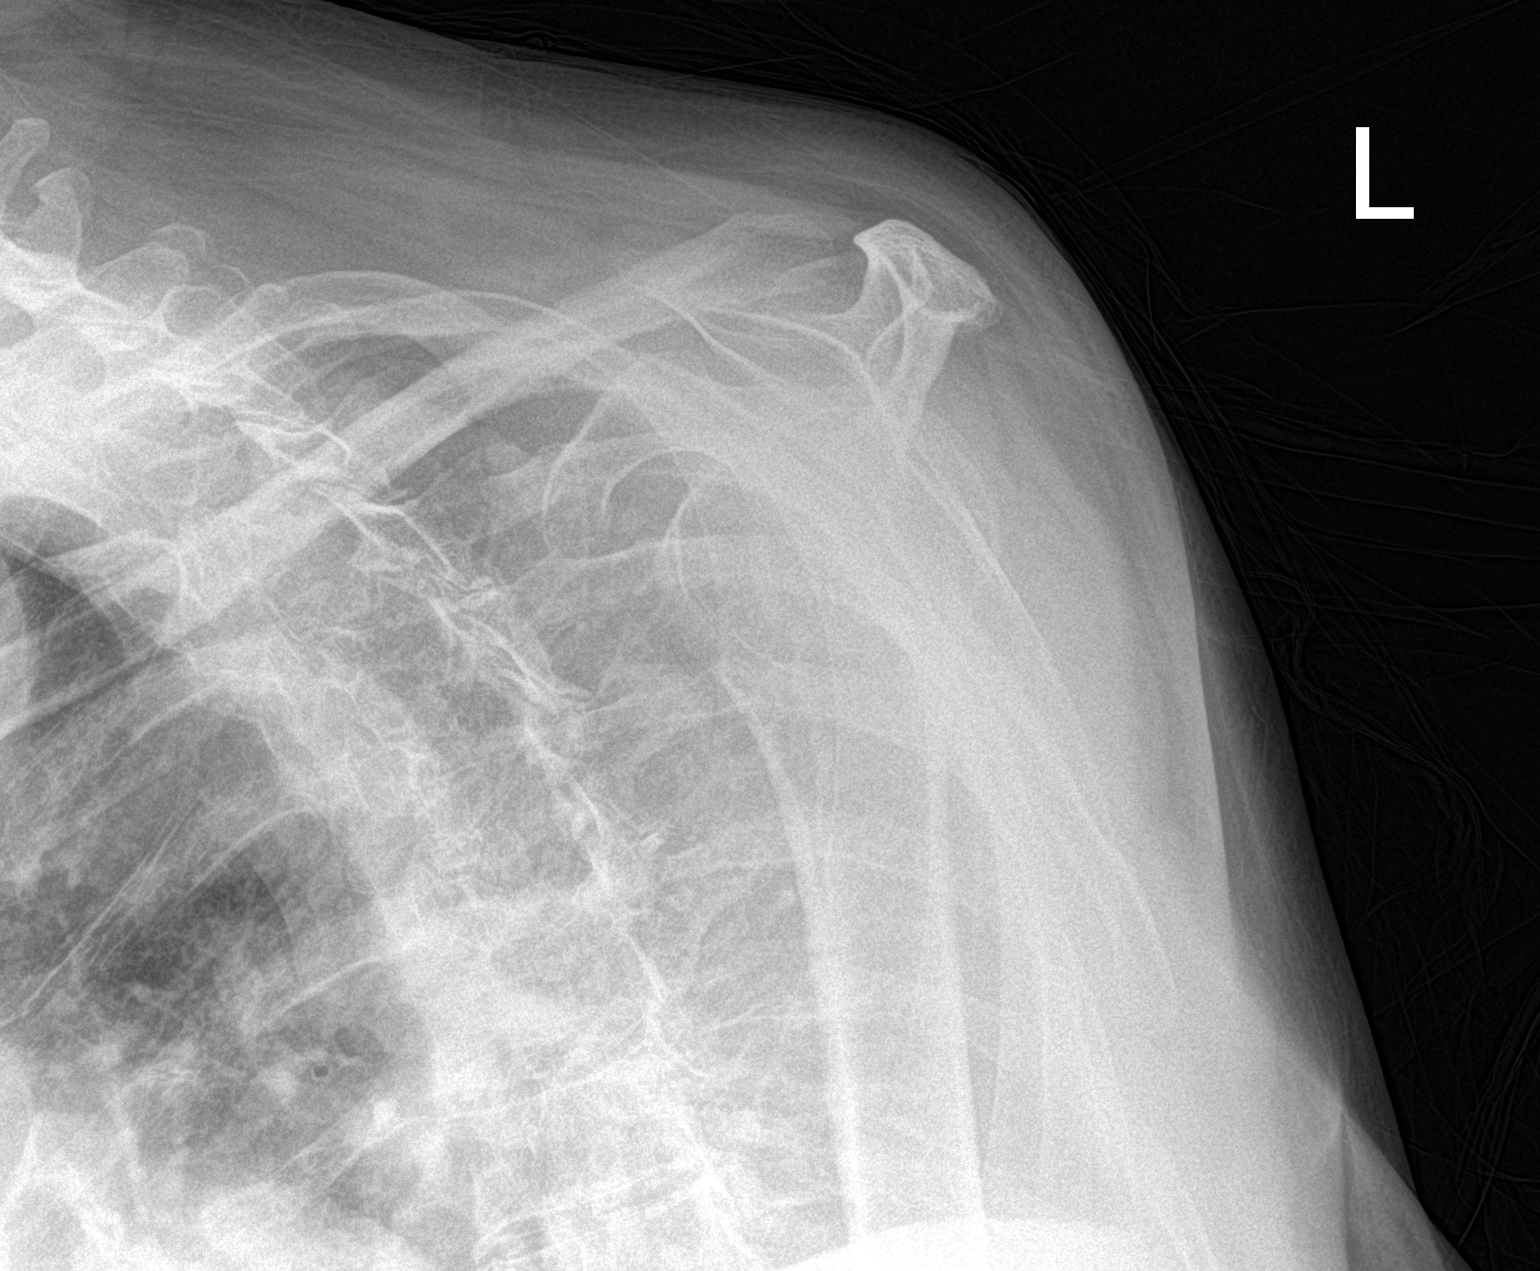
[im 3/3]
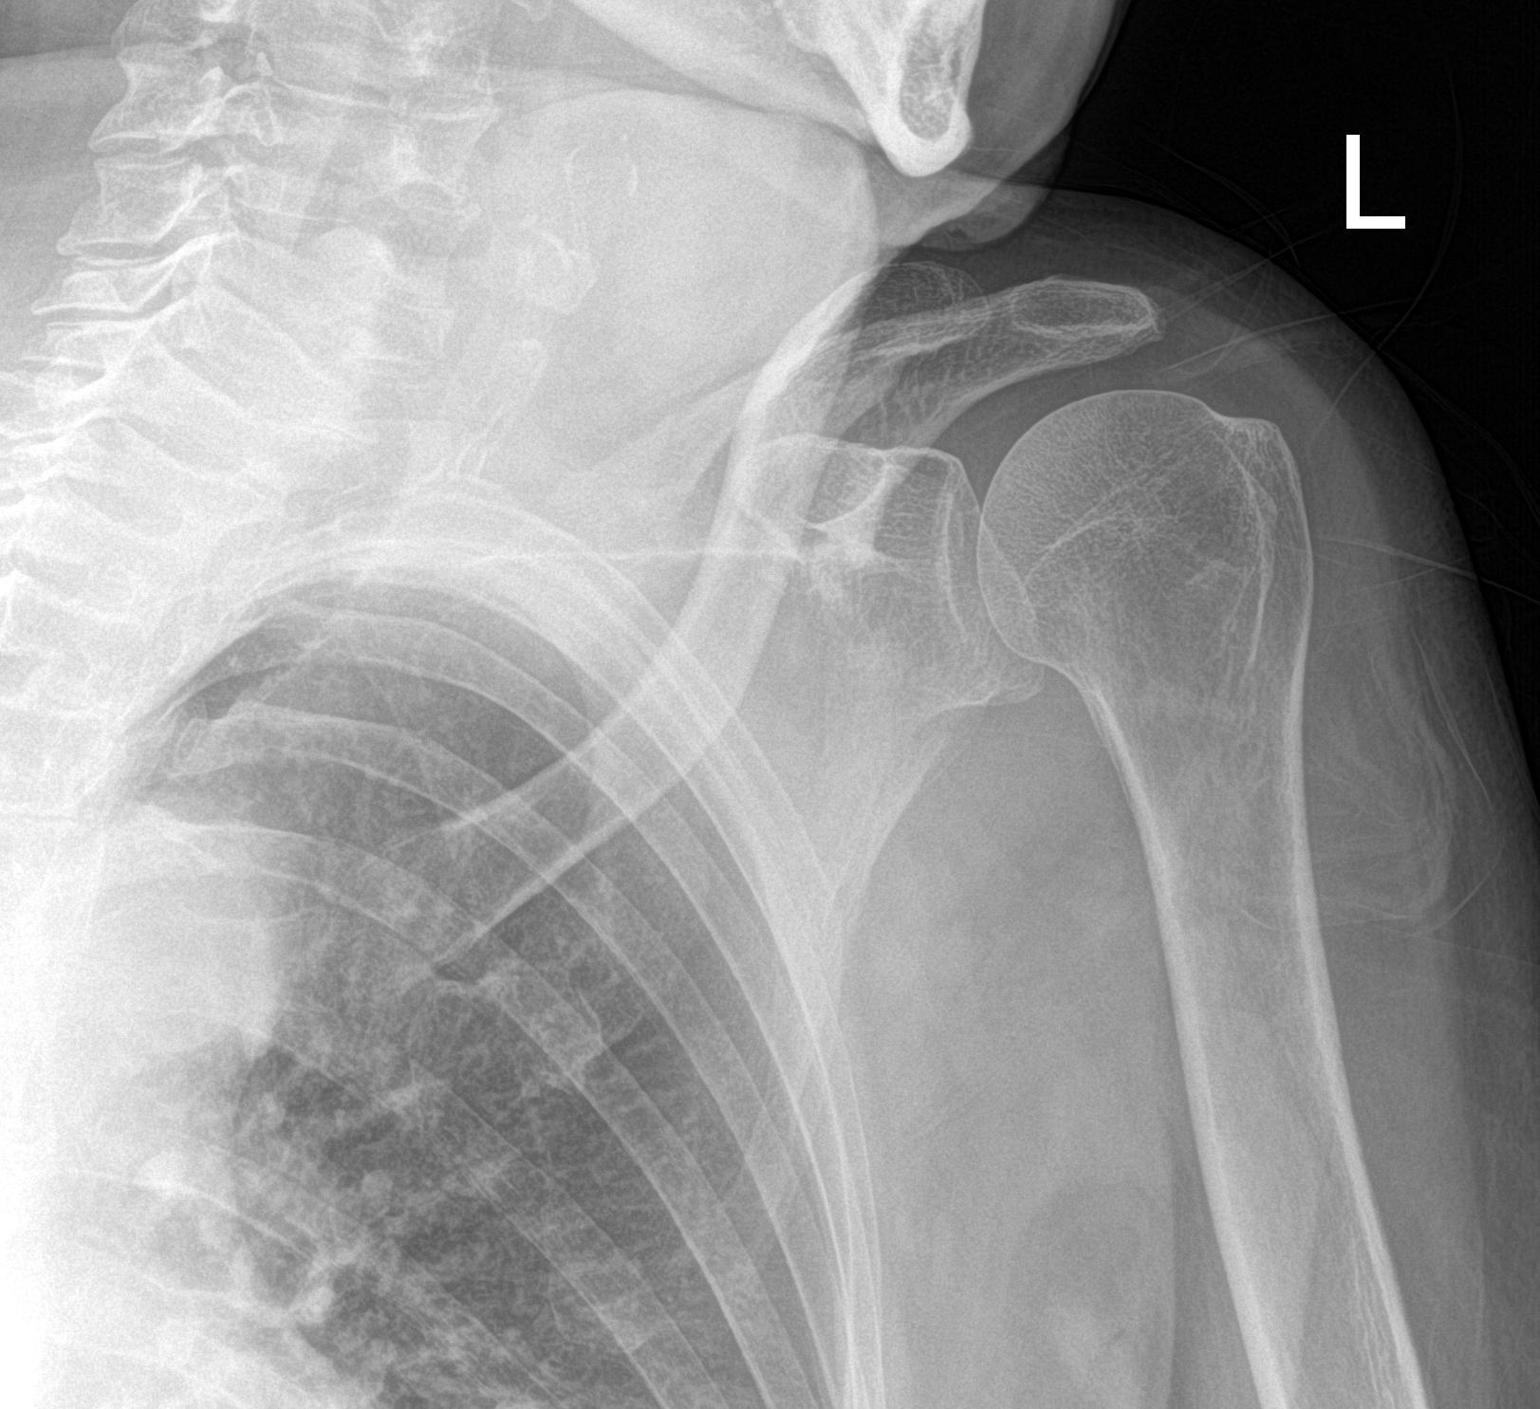

[3 of 3 positions shown; findings below may reference images not displayed]

FINDINGS: No fracture. No glenohumeral dislocation. No evidence of
acromioclavicular separation. No suspicious focal osseous lesions.
No significant arthropathy. Tiny soft tissue calcifications adjacent
to the posterolateral margin of the greater tuberosity. No
radiopaque foreign bodies.
IMPRESSION: Tiny soft tissue calcifications adjacent to the posterolateral
margin of the greater tuberosity, suggesting calcific
tendinopathy/bursitis. No acute osseous abnormality.

## 2021-05-08 ENCOUNTER — Ambulatory Visit (HOSPITAL_BASED_OUTPATIENT_CLINIC_OR_DEPARTMENT_OTHER)
Admission: RE | Admit: 2021-05-08 | Discharge: 2021-05-08 | Disposition: A | Discharge status: Home / Self Care | Payer: PPO | Source: Ambulatory Visit | Attending: Family Medicine | Admitting: Family Medicine

## 2021-05-08 DIAGNOSIS — H547 Unspecified visual loss: Secondary | ICD-10-CM | POA: Diagnosis not present

## 2021-05-08 DIAGNOSIS — R42 Dizziness and giddiness: Secondary | ICD-10-CM | POA: Diagnosis not present

## 2021-05-08 DIAGNOSIS — R930 Abnormal findings on diagnostic imaging of skull and head, not elsewhere classified: Secondary | ICD-10-CM | POA: Diagnosis present

## 2021-05-08 MED ORDER — GADOBUTROL 1 MMOL/ML IV SOLN
6.7000 mL | Freq: Once | INTRAVENOUS | Status: AC | PRN
  Administered 2021-05-08: 6.7 mL via INTRAVENOUS
  Filled 2021-05-08: qty 7.5

## 2021-05-14 ENCOUNTER — Other Ambulatory Visit (HOSPITAL_BASED_OUTPATIENT_CLINIC_OR_DEPARTMENT_OTHER): Payer: Self-pay | Admitting: Cardiology

## 2021-05-14 ENCOUNTER — Telehealth: Payer: Self-pay | Admitting: Neurology

## 2021-05-14 ENCOUNTER — Ambulatory Visit (INDEPENDENT_AMBULATORY_CARE_PROVIDER_SITE_OTHER): Payer: PPO | Admitting: Neurology

## 2021-05-14 ENCOUNTER — Encounter: Payer: Self-pay | Admitting: Neurology

## 2021-05-14 VITALS — BP 139/64 | HR 57 | Ht 66.0 in | Wt 144.0 lb

## 2021-05-14 DIAGNOSIS — R269 Unspecified abnormalities of gait and mobility: Secondary | ICD-10-CM | POA: Diagnosis not present

## 2021-05-14 DIAGNOSIS — R42 Dizziness and giddiness: Secondary | ICD-10-CM

## 2021-05-14 DIAGNOSIS — Z8673 Personal history of transient ischemic attack (TIA), and cerebral infarction without residual deficits: Secondary | ICD-10-CM | POA: Diagnosis not present

## 2021-05-14 NOTE — Patient Instructions (Addendum)
I had a long discussion with the patient and her 2 daughters regarding her worsening gait and dizziness for the last several months which seem to have started after starting Benicar.  This appears to be multifactorial with a component of orthostasis as well as dizziness related to her multiple strokes.  I recommend she do orthostatic tolerance exercises taper and discontinue Benicar reach out to primary care physician Dr. Osborne Casco to change to alternative blood pressure medicine.  Refer to outpatient physical therapy for gait and balance training.  Continue Plavix for stroke prevention and maintain strict control of hypertension blood pressure goal below 140/90, lipids with LDL cholesterol goal below 70 mg percent.  She was advised to use a walker for ambulation and we discussed fall safety precautions.  Return for follow-up in the future in 6 months or call earlier if necessary. ?

## 2021-05-14 NOTE — Progress Notes (Signed)
?[facility] ?[id number] ?[place]. [place] [id number] ?[phone] ? ?     OFFICE FOLLOW UP NOTE ? ?Chelsea [name] ?Date of Birth:  1945-06-04 ?Medical Record Number:  [id number]  ? ?Referring MD: Dr. Antonietta Barcelonaonuzi  ? ?Reason for Referral: Stroke ? ? ?Chief Complaint  ?Patient presents with  ? New Patient (Initial Visit)  ?  Rm 17. ?NX Chelsea [name] LV 2022/[facility] Proficient/[facility]/Samuel Peeples MD/vertigo hx stroke.  ? ? ? ?HPI:  ? ?Update 11/24/2020 JM: Patient presents for stroke follow-up after prior visit 6 months ago accompanied by daughter. ? ?Patient is still using walker for right-sided weakness and remains unable to cook or perform ADLs independently. Vision remains blurred. She is continuing physical/occupational therapy once a week. Denies falls. Continuing Plavix and Crestor without side effects. ? ?Routinely follows with [facility] for carotid stenosis. Is seeing Jodelle RedBridgette Christopher, MD with cardiology for hypertension management and recurrent syncopal events with f/u scheduled 11/17. Presented to ED 4 days ago for lightheadedness and hypotension, deemed vasovagal. Bps remain labile - routinely monitored at home typically SBP 140s but largely fluctuate . She has a cardiology appointment in 3 days. ? ? ? ? ? ?History provided for reference purposes only  ?Update 05/22/2020 JM: Chelsea [name] returns for stroke follow-up accompanied by her daughter after prior visit 3 months ago with Dr. Pearlean BrownieSethi.  She has been doing well since prior visit recently started working with PT for gait and balance impairment. She continues to use rolling walker and denies any recent falls.  Continues to have mild right sided weakness since her stroke with some improvement since prior visit.  Denies new stroke/TIA symptoms. ? ?Compliant on Plavix without associated side effects ?Self discontinued atorvastatin due to continued nausea and vomiting which subsided after stopping.  PCP recently started omega-3 but she has been  tolerating without side effects. ?Blood pressure today 160/78 - monitors at home and has been uncontrolled with PCP managing  ?Recent LDL 87; recent A1c 6.2 ? ?Daughter is frustrated regarding continued difficulty controlling blood pressure as well as recurrent UTIs. Denies being referred to any specialty such as cardiology or urology ? ?No further concerns at this time ? ? ?Consult visit 02/21/2020 Dr. Pearlean BrownieSethi: Chelsea [name] is a [age] Caucasian lady seen today for initial office consultation visit for stroke.  She is accompanied by her daughter Chelsea LeeSabrina.  History is obtained from the patient, review of electronic medical records and I personally reviewed pertinent imaging films and PACS.  She has past medical history of diabetes, hypertension, cardiac arrest who presented to [facility] emergency room on 11/07/2019 with confusion and she was a poor historian upon arrival.  Her blood pressure was found to be significantly elevated to 1 6/79.  She fell 1 week prior that she had lost vision in the left eye.  She had an MRI scan done after arrival which showed a right PCA infarct most involving the right hippocampus.  CT angiogram showed 80% focal left proximal internal carotid artery stenosis.  Left vertebral artery was occluded at the origin.  There was diffuse intracranial atherosclerotic changes involving both posterior cerebral arteries.  She was seen by vascular surgery and underwent elective left carotid endarterectomy in 12/03/2019 by Dr. Arbie CookeyEarly.  She was placed on aspirin Plavix due to intracranial atherosclerosis.  Her LDL cholesterol is 121 she started on Lipitor 80.  Hemoglobin A1c was significantly elevated at 10.1.  Patient states that she continues to have persistent left-sided vision difficulties however  is she has been quite sick with recurrent bouts of urinary tract infections as well as severe nausea as well as alternating constipation and diarrhea.  She has been on antibiotics several courses.  She is quite  frustrated with the primary care physician and has recently changed to a new primary care physician whom she has not seen yet.  She is barely able to walk with a walker and requires constant help from her daughter and she is now moved in to live with the daughter.  She still has poor balance and poor vision weakness is generalized.  She remains on aspirin and Plavix and denies significant bruising or bleeding.  She is also complaining of increased resurgence of her shingles pain in the left arm.  She had good response with Lyrica in the past but she had stopped it for few months and when she took 1 tablet of 100 mg she could not tolerated and hence she is not taking it since. ? ?Update 05/14/2021: Patient is seen today upon request from her daughters were concerned about her worsening dizziness.  She has had some dizziness and gait difficulties ever since her previous strokes but for the last 3 months were concerned that patient has taken a backward step.  She is barely able to walk even with a walker now.  Patient states that every time she gets up she feels lightheaded dizzy off balance.  She denies true vertigo.  She denies any blurred vision, near syncopal symptoms or increased weakness and numbness at that time.  The daughters feel that patient was started on Benicar around 3 months ago and this may be contributing.  Patient is to be previously quite independent but now she is requiring a lot of help from the daughter's for her mobility.  She did undergo an MRI scan of the brain on 05/08/2021 which did not reveal any acute abnormality and showed her previous old strokes.  Patient and family are wondering if she would benefit with another round of physical therapy.  Her blood pressure is usually been well controlled today it is 139/64.  She has not had any recurrent stroke or TIA symptoms.  She remains on Plavix which is tolerating well without significant bleeding and only minor bruising.  She is tolerating  Crestor well without muscle aches and pains.  She is unable to tell me when her last lipid profile was checked. ? ?ROS:   ?14 system review of systems is positive for those listed in HPI and all other systems negative ? ?PMH:  ?Past Medical History:  ?Diagnosis Date  ? Allergy   ? Anemia   ? Blind left eye   ? Cardiac arrest [facility])   ? Carotid artery occlusion   ? Cataract   ? Depression   ? Diabetes mellitus without complication (HCC)   ? Hyperlipidemia   ? Hypertension   ? Oxygen deficiency   ? Stroke [facility])   ? ? ?Social History:  ?Social History  ? ?Socioeconomic History  ? Marital status: Divorced  ?  Spouse name: Not on file  ? Number of children: Not on file  ? Years of education: Not on file  ? Highest education level: Not on file  ?Occupational History  ? Not on file  ?Tobacco Use  ? Smoking status: Former  ?  Types: Cigarettes  ? Smokeless tobacco: Never  ?Vaping Use  ? Vaping Use: Never used  ?Substance and Sexual Activity  ? Alcohol use: Not Currently  ?  Drug use: Never  ? Sexual activity: Not Currently  ?Other Topics Concern  ? Not on file  ?Social History Narrative  ? Lives with daughter, Chelsea [name]  ? Right Handed  ? Drinks no caffeine  ? ?Social Determinants of Health  ? ?Financial Resource Strain: Not on file  ?Food Insecurity: Not on file  ?Transportation Needs: Not on file  ?Physical Activity: Not on file  ?Stress: Not on file  ?Social Connections: Not on file  ?Intimate Partner Violence: Not on file  ? ? ?Medications:   ?Current Outpatient Medications on File Prior to Visit  ?Medication Sig Dispense Refill  ? acetaminophen (TYLENOL) 500 MG tablet Take 1,000 mg by mouth every 6 (six) hours as needed for moderate pain.    ? amLODipine (NORVASC) 5 MG tablet Take 1 tablet (5 mg total) by mouth daily. 90 tablet 3  ? carvedilol (COREG) 3.125 MG tablet Take 1 tablet (3.125 mg total) by mouth 2 (two) times daily with a meal. 180 tablet 3  ? clopidogrel (PLAVIX) 75 MG tablet TAKE 1 TABLET ONCE DAILY 90 tablet  3  ? Elastic Bandages & Supports (MEDICAL COMPRESSION SOCKS) MISC 1 Units by Does not apply route daily. 1 each 0  ? Ferrous Sulfate (IRON) 325 (65 Fe) MG TABS Take 325 mg by mouth every other day.    ? guaiFE

## 2021-05-14 NOTE — Telephone Encounter (Signed)
Pt's daughter Laren Boom [phone] is asking for a call letting her know where PT will be before referral is sent.  ?

## 2021-05-20 ENCOUNTER — Encounter: Payer: PPO | Admitting: Cardiology

## 2021-05-25 ENCOUNTER — Encounter (HOSPITAL_BASED_OUTPATIENT_CLINIC_OR_DEPARTMENT_OTHER): Payer: Self-pay | Admitting: Cardiology

## 2021-05-25 ENCOUNTER — Ambulatory Visit (INDEPENDENT_AMBULATORY_CARE_PROVIDER_SITE_OTHER): Payer: PPO | Admitting: Cardiology

## 2021-05-25 VITALS — BP 152/77 | HR 53 | Ht 65.0 in | Wt 147.0 lb

## 2021-05-25 DIAGNOSIS — E7800 Pure hypercholesterolemia, unspecified: Secondary | ICD-10-CM

## 2021-05-25 DIAGNOSIS — Z8673 Personal history of transient ischemic attack (TIA), and cerebral infarction without residual deficits: Secondary | ICD-10-CM | POA: Diagnosis not present

## 2021-05-25 DIAGNOSIS — R42 Dizziness and giddiness: Secondary | ICD-10-CM | POA: Diagnosis not present

## 2021-05-25 DIAGNOSIS — I10 Essential (primary) hypertension: Secondary | ICD-10-CM

## 2021-05-25 DIAGNOSIS — N1832 Chronic kidney disease, stage 3b: Secondary | ICD-10-CM | POA: Diagnosis not present

## 2021-05-25 DIAGNOSIS — E118 Type 2 diabetes mellitus with unspecified complications: Secondary | ICD-10-CM

## 2021-05-25 NOTE — Progress Notes (Signed)
Cardiology Office Note:    Date:  05/25/2021   ID:  Chelsea, [name] [birth year], MRN [id number]  PCP:  Chelsea Gaskins, FNP  Cardiologist:  Chelsea Dresser, MD  Referring MD: Chelsea Chew, MD   CC: follow up  History of Present Illness:    Chelsea [name] is a 76 y.o. female with a hx of CVA, cardiac arrest, carotid artery occlusion, diabetes mellitus without complication, hypertension, and stroke who is seen for follow up today. I initially met her 06/18/20 as a new consult at the request of Chelsea Chew, MD for the evaluation and management of uncontrolled hypertension.  Today: Note from Dr. Joylene [name] at [facility] from 03/10/21 reviewed. At that visit, olmesartan increased to 20 mg daily, amlodipine decreased to 5 mg daily, torsemide decreased to 40 mg daily due to LE edema. BP well controlled.  Still lightheaded. Feels unbalanced. Has had CT, MRI without clear explanation. Starts PT tomorrow. Per note from Dr. Leonie [name], was told to taper off olmesartan. She has noticed no difference in going from 20 mg to 10 mg tabs. They are concerned about stopping the olmesartan given that this is heart and kidney protective.  Symptoms are only with changing position. She pumps her legs 12 times each before standing up, but this does not seem to help.   Swelling much improved. Eating and drinking well. No more nausea. Had her eyes checked, got new glasses. Longest period without an episode of lightheadedness has been about 3 days.   Current regimen: Amlodipine 5 mg in the AM Carvedilol 3.125 mg BID Olmesartan 10 mg at night (cut in half after visit with Dr. Leonie [name]) Torsemide 40 mg in the AM  Past Medical History:  Diagnosis Date   Allergy    Anemia    Blind left eye    Cardiac arrest [facility])    Carotid artery occlusion    Cataract    Depression    Diabetes mellitus without complication ([place])    Hyperlipidemia    Hypertension    Oxygen deficiency    Stroke [facility])      Past Surgical History:  Procedure Laterality Date   ENDARTERECTOMY Left 12/03/2019   Procedure: LEFT CAROTID ENDARTERECTOMY;  Surgeon: Angelia Mould, MD;  Location: [facility];  Service: Vascular;  Laterality: Left;   PATCH ANGIOPLASTY Left 12/03/2019   Procedure: PATCH ANGIOPLASTY Left Carotid Artery;  Surgeon: Angelia Mould, MD;  Location: [facility] OR;  Service: Vascular;  Laterality: Left;    Current Medications: Current Outpatient Medications on File Prior to Visit  Medication Sig   acetaminophen (TYLENOL) 500 MG tablet Take 1,000 mg by mouth every 6 (six) hours as needed for moderate pain.   amLODipine (NORVASC) 5 MG tablet Take 1 tablet (5 mg total) by mouth daily.   carvedilol (COREG) 3.125 MG tablet Take 1 tablet (3.125 mg total) by mouth 2 (two) times daily with a meal.   clopidogrel (PLAVIX) 75 MG tablet TAKE 1 TABLET ONCE DAILY   Elastic Bandages & Supports (MEDICAL COMPRESSION SOCKS) MISC 1 Units by Does not apply route daily.   Ferrous Sulfate (IRON) 325 (65 Fe) MG TABS Take 325 mg by mouth every other day.   guaiFENesin (MUCINEX) 600 MG 12 hr tablet Take 1 tablet (600 mg total) by mouth 2 (two) times daily.   metroNIDAZOLE (METROGEL) 0.75 % vaginal gel Place 1 Applicatorful vaginally 2 (two) times daily.   olmesartan (BENICAR) 20 MG tablet Take 10 mg by mouth  daily.   rosuvastatin (CRESTOR) 5 MG tablet Take 5 mg by mouth at bedtime.   tamsulosin (FLOMAX) 0.4 MG CAPS capsule Take 0.4 mg by mouth every evening.   torsemide (DEMADEX) 20 MG tablet Take 40 mg by mouth daily.   No current facility-administered medications on file prior to visit.     Allergies:   Codeine, Latex, Penicillins, Sulfa antibiotics, Benzodiazepines, Lidocaine, Oxycodone-acetaminophen, Trazodone, Cephalexin, Gabapentin, Lipitor [atorvastatin], and Lyrica [pregabalin]   Social History   Tobacco Use   Smoking status: Former    Types: Cigarettes   Smokeless tobacco: Never  Vaping Use    Vaping Use: Never used  Substance Use Topics   Alcohol use: Not Currently   Drug use: Never    Family History: family history includes Arthritis in her brother, daughter, father, mother, and sister; COPD in her sister; Cancer in her mother; Depression in her brother, daughter, sister, and son; Diabetes in her brother, daughter, father, mother, sister, and son; Heart attack in her mother and sister; Heart disease in her brother and sister; Hyperlipidemia in her father and mother; Hypertension in her brother, daughter, father, mother, and sister; Stroke in her brother, daughter, and father.  ROS:   Please see the history of present illness.   Additional pertinent ROS otherwise unremarkable.  EKGs/Labs/Other Studies Reviewed:    The following studies were reviewed today: Bilateral Carotid Duplex 10/30/2020: Summary:  Right Carotid: Velocities in the right ICA are consistent with a 1-39%  stenosis.   Left Carotid: There is no evidence of stenosis in the left ICA.   Vertebrals:  Right vertebral artery demonstrates antegrade flow. Left  vertebral artery was not visualized.   Subclavians: Normal flow hemodynamics were seen in bilateral subclavian arteries.   Echo 10/07/20 1. Peak LV outflow tract velocity 2.7 m/s, peak gradient 29 mmHg (no  significant obstruction). Left ventricular ejection fraction, by  estimation, is 70 to 75%. The left ventricle has hyperdynamic function.  The left ventricle has no regional wall motion  abnormalities. There is mild left ventricular hypertrophy. Left  ventricular diastolic parameters are consistent with Grade I diastolic  dysfunction (impaired relaxation).   2. Right ventricular systolic function is normal. The right ventricular  size is normal.   3. The mitral valve is normal in structure. No evidence of mitral valve  regurgitation. No evidence of mitral stenosis. Moderate mitral annular  calcification.   4. The aortic valve is tricuspid. Aortic  valve regurgitation is not  visualized. Mild aortic valve sclerosis is present, with no evidence of  aortic valve stenosis.   5. The inferior vena cava is normal in size with greater than 50%  respiratory variability, suggesting right atrial pressure of 3 mmHg.   [place] LE Venous 12/12/2019: RIGHT:  - There is no evidence of deep vein thrombosis in the lower extremity.  - No cystic structure found in the popliteal fossa.     LEFT:  - No evidence of common femoral vein obstruction.  Echo 11/07/2019:  1. Left ventricular ejection fraction, by estimation, is 60 to 65%. The  left ventricle has normal function. The left ventricle has no regional  wall motion abnormalities. There is mild asymmetric left ventricular  hypertrophy of the basal-septal segment.  Left ventricular diastolic parameters are consistent with Grade I  diastolic dysfunction (impaired relaxation). Elevated left ventricular  end-diastolic pressure.   2. Right ventricular systolic function is normal. The right ventricular  size is normal.   3. Left atrial size was moderately dilated.  4. The mitral valve is normal in structure. Trivial mitral valve  regurgitation. No evidence of mitral stenosis. Moderate mitral annular  calcification.   5. The aortic valve is tricuspid. There is mild calcification of the  aortic valve. There is mild thickening of the aortic valve. Aortic valve  regurgitation is not visualized. Mild aortic valve sclerosis is present,  with no evidence of aortic valve  stenosis.   6. The inferior vena cava is normal in size with greater than 50%  respiratory variability, suggesting right atrial pressure of 3 mmHg.   7. Agitated saline contrast bubble study was negative, with no evidence  of any interatrial shunt.   Comparison(s): No prior Echocardiogram.   Conclusion(s)/Recommendation(s): Normal biventricular function without  evidence of hemodynamically significant valvular heart disease. No   intracardiac source of embolism detected on this transthoracic study. A  transesophageal echocardiogram is  recommended to exclude cardiac source of embolism if clinically indicated.   US Carotid Duplex 11/02/2019: Summary:  Right Carotid: Velocities in the right ICA are consistent with a 1-39%  stenosis.   Left Carotid: Velocities in the left ICA are consistent with a 80-99%  stenosis.   Vertebrals:  Bilateral vertebral arteries demonstrate antegrade flow.  Subclavians: Normal flow hemodynamics were seen in bilateral subclavian arteries.   EKG:  EKG was not ordered today  03/17/2021: ECG not ordered today 01/20/2021: EKG was not ordered. 06/18/2020: sinus bradycardia at 59 bpm, nonspecific t wave pattern  Recent Labs: 09/29/2020: ALT 8 10/09/2020: Magnesium 1.9 01/08/2021: BUN 58; Creatinine, Ser 2.12; Hemoglobin 11.7; Platelets 307; Potassium 4.0; Sodium 134   Recent Lipid Panel    Component Value Date/Time   CHOL 173 11/08/2019 0406   TRIG 82 11/08/2019 0406   HDL 36 (L) 11/08/2019 0406   CHOLHDL 4.8 11/08/2019 0406   VLDL 16 11/08/2019 0406   LDLCALC 121 (H) 11/08/2019 0406    Physical Exam:    VS:  BP (!) 152/77   Pulse (!) 53   Ht 5' 5"  (1.651 m)   Wt 147 lb (66.7 kg)   SpO2 99%   BMI 24.46 kg/m     Wt Readings from Last 3 Encounters:  05/25/21 147 lb (66.7 kg)  05/14/21 144 lb (65.3 kg)  03/17/21 143 lb 3.2 oz (65 kg)   GEN: Well nourished, well developed in no acute distress HEENT: Normal, moist mucous membranes NECK: No JVD CARDIAC: regular rhythm, normal S1 and S2, no rubs or gallops. No murmur. VASCULAR: Radial and DP pulses 2+ bilaterally. No carotid bruits RESPIRATORY:  Clear to auscultation without rales, wheezing or rhonchi  ABDOMEN: Soft, non-tender, non-distended MUSCULOSKELETAL:  Ambulates independently but using wheelchair SKIN: Warm and dry, no edema NEUROLOGIC:  Alert and oriented x 3. No focal neuro deficits noted. PSYCHIATRIC:  Normal  affect   ASSESSMENT:    1. Lightheadedness   2. History of CVA (cerebrovascular accident)   3. Essential hypertension   4. Stage 3b chronic kidney disease ([place])   5. Pure hypercholesterolemia   6. Type 2 diabetes mellitus with complication, without long-term current use of insulin (HCC)     PLAN:    Lightheadedness/dizziness:  -no clear cardiac etiology for this. See below. Follows with Dr. Leonie [name] as well.  History of CVA Carotid stenosis, left Hypercholesterolemia -continue clopidogrel -last LDL 121, goal <70 -she is currently on rosuvastatin 5 mg daily. We have discussed intensifying her regimen in the past, continue to discuss  Hypertension, labile -her BP is elevated today, but  she feels poorly. With dizziness/lightheadedness (above), will monitor for now and avoid making changes. They will contact me if remains consistently elevated -has a history of both very low and very high pressures, need to be cautious with med changes -continue amlodipine, carvedilol, torsemide. Has come off of hydralazine and is being tapered off olmesartan. See comments--willing to try this, but if no improvement off of olmesartan symptomatically, would restart and increase gradually to protect heart/kidneys and potentially come off amlodipine.  Type II diabetes Chronic kidney disease, stage 3b (GFR 39) Former smoker -no edema on torsemide. Given CKD, continue daily dose with PRN second daily dose for edema -With frequent UTIs, SGLT2i not ideal. Considered GLP1RA, though she has had issues with lack of appetite/weight loss, so would not start at this time.  Cardiac risk counseling and prevention recommendations: -recommend heart healthy/Mediterranean diet, with whole grains, fruits, vegetable, fish, lean meats, nuts, and olive oil. Limit salt. -limited mobility, cannot walk for significant distance -recommend avoidance of tobacco products. Avoid excess alcohol.  Plan for follow up: 2 months or  sooner as needed after seeing neurology.  Chelsea Dresser, MD, PhD, [facility]    Medication Adjustments/Labs and Tests Ordered: Current medicines are reviewed at length with the patient today.  Concerns regarding medicines are outlined above.   No orders of the defined types were placed in this encounter.  No orders of the defined types were placed in this encounter.  Patient Instructions  Medication Instructions:  Try stopping the olmesartan (benicar). Give it about a week. If you feel better, then we can try to explore other options. If you do not feel better, I would like you to get back on the olmesartan (benicar) given that it is heart and kidney protective. If you do not feel better after a week, restart the olmesartan at 10 mg daily for a week, then if blood pressure still up (see below), we can go back up to 20 mg daily dose.  If you can keep a log of blood pressures, especially when you feel poorly, we can see how closely this is related to your symptoms.  Goal blood pressure average is 130/80. I'm ok with a range of 110/70s-150/90s. If consistently much lower or higher than this, please let me know.   *If you need a refill on your cardiac medications before your next appointment, please call your pharmacy*   Lab Work: None ordered today   Testing/Procedures: None ordered today   Follow-Up: At [facility], you and your health needs are our priority.  As part of our continuing mission to provide you with exceptional heart care, we have created designated Provider Care Teams.  These Care Teams include your primary Cardiologist (physician) and Advanced Practice Providers (APPs -  Physician Assistants and Nurse Practitioners) who all work together to provide you with the care you need, when you need it.  We recommend signing up for the patient portal called "MyChart".  Sign up information is provided on this After Visit Summary.  MyChart is used  to connect with patients for Virtual Visits (Telemedicine).  Patients are able to view lab/test results, encounter notes, upcoming appointments, etc.  Non-urgent messages can be sent to your provider as well.   To learn more about what you can do with MyChart, go to [email or web address].    Your next appointment:   2 month(s)  The format for your next appointment:   In Person  Provider:   Buford Dresser, MD{  Important Information About Sugar          Signed, Chelsea Dresser, MD PhD 05/25/2021     [place]

## 2021-05-25 NOTE — Patient Instructions (Signed)
Medication Instructions:  ?Try stopping the olmesartan (benicar). Give it about a week. If you feel better, then we can try to explore other options. If you do not feel better, I would like you to get back on the olmesartan (benicar) given that it is heart and kidney protective. If you do not feel better after a week, restart the olmesartan at 10 mg daily for a week, then if blood pressure still up (see below), we can go back up to 20 mg daily dose. ? ?If you can keep a log of blood pressures, especially when you feel poorly, we can see how closely this is related to your symptoms. ? ?Goal blood pressure average is 130/80. I'm ok with a range of 110/70s-150/90s. If consistently much lower or higher than this, please let me know.  ? ?*If you need a refill on your cardiac medications before your next appointment, please call your pharmacy* ? ? ?Lab Work: ?None ordered today ? ? ?Testing/Procedures: ?None ordered today ? ? ?Follow-Up: ?At [facility], you and your health needs are our priority.  As part of our continuing mission to provide you with exceptional heart care, we have created designated Provider Care Teams.  These Care Teams include your primary Cardiologist (physician) and Advanced Practice Providers (APPs -  Physician Assistants and Nurse Practitioners) who all work together to provide you with the care you need, when you need it. ? ?We recommend signing up for the patient portal called "MyChart".  Sign up information is provided on this After Visit Summary.  MyChart is used to connect with patients for Virtual Visits (Telemedicine).  Patients are able to view lab/test results, encounter notes, upcoming appointments, etc.  Non-urgent messages can be sent to your provider as well.   ?To learn more about what you can do with MyChart, go to [email or web address].   ? ?Your next appointment:   ?2 month(s) ? ?The format for your next appointment:   ?In Person ? ?Provider:   ?Jodelle Red,  MD{ ? ? ?Important Information About Sugar ? ? ? ? ? ? ? ?

## 2021-05-26 ENCOUNTER — Ambulatory Visit: Payer: PPO | Attending: Neurology

## 2021-05-26 DIAGNOSIS — I63531 Cerebral infarction due to unspecified occlusion or stenosis of right posterior cerebral artery: Secondary | ICD-10-CM | POA: Diagnosis present

## 2021-05-26 DIAGNOSIS — R262 Difficulty in walking, not elsewhere classified: Secondary | ICD-10-CM | POA: Insufficient documentation

## 2021-05-26 DIAGNOSIS — R2681 Unsteadiness on feet: Secondary | ICD-10-CM | POA: Insufficient documentation

## 2021-05-26 DIAGNOSIS — R269 Unspecified abnormalities of gait and mobility: Secondary | ICD-10-CM | POA: Diagnosis not present

## 2021-05-26 DIAGNOSIS — M6281 Muscle weakness (generalized): Secondary | ICD-10-CM | POA: Insufficient documentation

## 2021-05-26 DIAGNOSIS — R42 Dizziness and giddiness: Secondary | ICD-10-CM | POA: Insufficient documentation

## 2021-05-26 NOTE — Therapy (Signed)
[facility] ?[address] ?[place], [place], [address] ?Phone: [phone]   Fax:  [phone] ? ?Physical Therapy Evaluation ? ?Patient Details  ?Name: Chelsea [name] ?MRN: [id number] ?Date of Birth: 22-Apr-1945 ?Referring Provider (PT): Delia Heady, MD ? ? ?Encounter Date: 05/26/2021 ? ? PT End of Session - 05/26/21 1030   ? ? Visit Number 1   ? Number of Visits 12   ? Date for PT Re-Evaluation 08/07/21   ? PT Start Time 1033   ? PT Stop Time 1115   ? PT Time Calculation (min) 42 min   ? Equipment Utilized During Treatment Gait belt   ? Activity Tolerance Patient tolerated treatment well   ? Behavior During Therapy [facility] for tasks assessed/performed   ? ?  ?  ? ?  ? ? ?Past Medical History:  ?Diagnosis Date  ? Allergy   ? Anemia   ? Blind left eye   ? Cardiac arrest [facility])   ? Carotid artery occlusion   ? Cataract   ? Depression   ? Diabetes mellitus without complication (HCC)   ? Hyperlipidemia   ? Hypertension   ? Oxygen deficiency   ? Stroke [facility])   ? ? ?Past Surgical History:  ?Procedure Laterality Date  ? ENDARTERECTOMY Left 12/03/2019  ? Procedure: LEFT CAROTID ENDARTERECTOMY;  Surgeon: Chuck Hint, MD;  Location: [facility] OR;  Service: Vascular;  Laterality: Left;  ? PATCH ANGIOPLASTY Left 12/03/2019  ? Procedure: PATCH ANGIOPLASTY Left Carotid Artery;  Surgeon: Chuck Hint, MD;  Location: [facility] OR;  Service: Vascular;  Laterality: Left;  ? ? ?There were no vitals filed for this visit. ? ? ? Subjective Assessment - 05/26/21 1032   ? ? Subjective Patient reports that that she had multiple CVA's about two years ago which caused her to get really weak. However, she notes that her balance got worse about 3 months ago with no known cause as she had CT scans which showed no evidence of a new stroke. She notes that it has not gotten any better or worse since it first began. She notes that she does not really get dizzy, but it feels more like unsteadiness. She  notes that the strokes primarily effected the right side of her body. She feels that most of her loss of balance occurs laterally rather than forward or backward. Her caregiver notes that since she began having her strokes that she cannot walk safely alone. She notes that turning seems to be when she has the most difficulty and unsteadiness.   ? Pertinent History multiple CVA's, CKD,   ? Limitations Walking;Standing   ? Patient Stated Goals improved balance   ? Currently in Pain? No/denies   ? ?  ?  ? ?  ? ? ? ? ? [facility] PT Assessment - 05/26/21 0001   ? ?  ? Assessment  ? Medical Diagnosis Gait difficulty   ? Referring Provider (PT) Delia Heady, MD   ? Hand Dominance Right   ? Next MD Visit 11/26/21   ? Prior Therapy yes   ?  ? Precautions  ? Precautions Fall   ?  ? Restrictions  ? Weight Bearing Restrictions No   ?  ? Balance Screen  ? Has the patient fallen in the past 6 months No   ? Has the patient had a decrease in activity level because of a fear of falling?  Yes   ? Is the patient reluctant to leave their home  because of a fear of falling?  Yes   ?  ? Home Environment  ? Living Environment Private residence   ? Living Arrangements Children   ? Home Access Ramped entrance   ? Home Layout One level   ? Home Equipment Walker - 2 wheels   ?  ? Prior Function  ? Level of Independence Needs assistance with ADLs;Needs assistance with gait;Needs assistance with homemaking;Needs assistance with transfers   ?  ? Cognition  ? Attention Alternating   ? Memory Appears intact   ?  ? Sensation  ? Light Touch Impaired by gross assessment   diminished sensation in BLE below the knee  ? Additional Comments Patient reports tingling in BLE   ?  ? ROM / Strength  ? AROM / PROM / Strength Strength   ?  ? Strength  ? Strength Assessment Site Ankle;Hip;Knee   ? Right/Left Hip Right;Left   ? Right Hip Flexion 3-/5   ? Left Hip Flexion 4-/5   ? Right/Left Knee Right;Left   ? Right Knee Flexion 3+/5   ? Right Knee Extension 3/5   ?  Left Knee Flexion 4/5   ? Left Knee Extension 4-/5   ? Right/Left Ankle Right;Left   ? Right Ankle Dorsiflexion 3+/5   ? Left Ankle Dorsiflexion 4-/5   ?  ? Transfers  ? Transfers Sit to Stand;Stand to Sit   ? Sit to Stand 4: Min assist;With upper extremity assist;With armrests   ? Stand to Sit 4: Min assist;With upper extremity assist;With armrests   ?  ? Ambulation/Gait  ? Ambulation/Gait Yes   ? Ambulation/Gait Assistance 4: Min guard   ? Assistive device Rolling walker   ? Gait Pattern Step-to pattern;Shuffle   ? Ambulation Surface Level;Indoor   ?  ? Standardized Balance Assessment  ? Standardized Balance Assessment Timed Up and Go Test   ?  ? Timed Up and Go Test  ? TUG Normal TUG   ? Normal TUG (seconds) 106   1:46  ? TUG Comments with walker, gait belt, and close supervision   ? ?  ?  ? ?  ? ? ? ? ? ? ? ? ? ? ? ? ? ?Objective measurements completed on examination: See above findings.  ? ? ? ? ? ? ? ? ? ? ? ? ? ? ? ? ? ? ? PT Long Term Goals - 05/26/21 1313   ? ?  ? PT LONG TERM GOAL #1  ? Title Patient will be independent with HEP   ? Time 6   ? Period Weeks   ? Status New   ? Target Date 07/07/21   ?  ? PT LONG TERM GOAL #2  ? Title Patient will be able to independently transfer from sitting to standing with at most close supervision for increased independence.   ? Time 6   ? Period Weeks   ? Status New   ? Target Date 07/07/21   ?  ? PT LONG TERM GOAL #3  ? Title Patient will improve her TUG time to 90 seconds (1:30) or less for improved mobility.   ? Time 6   ? Period Weeks   ? Status New   ? Target Date 07/07/21   ?  ? PT LONG TERM GOAL #4  ? Title Patient will be able to ambulate with a step through pattern for improved gait speed and mobiltiy.   ? Time 6   ? Period Weeks   ?  Status New   ? Target Date 07/07/21   ? ?  ?  ? ?  ? ? ? ? ? ? ? ? ? Plan - 05/26/21 1256   ? ? Clinical Impression Statement Patient is a 76 year old year old female presenting to physical therapy with gait deviations and  unsteadiness following a CVA approximately two years ago and then an unexplained decline approximately three months ago. She exhibited significant bilateral lower extremity weakness with the RLE being more affected that the left. She also exhibited significant gait deviations and reduced gait speed. These deficits along with her difficulty with transfers led to her elevated TUG time which places her at a high risk of falls. Recommend that she continue with skilled physical therapy to address her impairments to maximize her mobility and safety.   ? Personal Factors and Comorbidities Time since onset of injury/illness/exacerbation;Transportation;Other;Comorbidity 3+   ? Comorbidities history of multiple CVAs, HTN, DM, R LE weakness, blind left eye, Carotid artery occlusion, CKD   ? Examination-Activity Limitations Transfers;Locomotion Level;Stand   ? Examination-Participation Restrictions Other   ? Stability/Clinical Decision Making Evolving/Moderate complexity   ? Clinical Decision Making Moderate   ? Rehab Potential Fair   ? PT Frequency 2x / week   ? PT Duration 6 weeks   ? PT Treatment/Interventions ADLs/Self Care Home Management;Gait training;Stair training;Functional mobility training;Therapeutic activities;Therapeutic exercise;Balance training;Neuromuscular re-education;Manual techniques;Passive range of motion;Patient/family education   ? PT Next Visit Plan upper and LE strengthening, standing interventions (gait belt and close supervision),   ? Consulted and Agree with Plan of Care Patient;Family member/caregiver   ? Family Member Consulted daughter   ? ?  ?  ? ?  ? ? ?Patient will benefit from skilled therapeutic intervention in order to improve the following deficits and impairments:  Abnormal gait, Difficulty walking, Decreased range of motion, Decreased activity tolerance, Decreased balance, Decreased strength, Postural dysfunction, Impaired sensation ? ?Visit Diagnosis: ?Difficulty in walking, not  elsewhere classified ? ?Muscle weakness (generalized) ? ? ? ? ?Problem List ?Patient Active Problem List  ? Diagnosis Date Noted  ? Hyponatremia 10/07/2020  ? AKI (acute kidney injury) (HCC) 10/07/2020  ? Acute kidney in

## 2021-05-28 ENCOUNTER — Ambulatory Visit: Payer: PPO | Admitting: *Deleted

## 2021-05-28 DIAGNOSIS — R262 Difficulty in walking, not elsewhere classified: Secondary | ICD-10-CM

## 2021-05-28 DIAGNOSIS — R2681 Unsteadiness on feet: Secondary | ICD-10-CM

## 2021-05-28 DIAGNOSIS — M6281 Muscle weakness (generalized): Secondary | ICD-10-CM

## 2021-05-28 NOTE — Therapy (Signed)
[facility] [address] [place], [place], [address] Phone: [phone]   Fax:  [phone]  Physical Therapy Treatment  Patient Details  Name: Chelsea JanskySandra M [name] MRN: [id number] Date of Birth: 1945-12-14 Referring Provider (PT): Delia HeadyPramod Sethi, MD   Encounter Date: 05/28/2021   PT End of Session - 05/28/21 0951     Visit Number 2    Number of Visits 12    Date for PT Re-Evaluation 08/07/21    PT Start Time 0945    PT Stop Time 1031    PT Time Calculation (min) 46 min             Past Medical History:  Diagnosis Date   Allergy    Anemia    Blind left eye    Cardiac arrest [facility](HCC)    Carotid artery occlusion    Cataract    Depression    Diabetes mellitus without complication (HCC)    Hyperlipidemia    Hypertension    Oxygen deficiency    Stroke [facility](HCC)     Past Surgical History:  Procedure Laterality Date   ENDARTERECTOMY Left 12/03/2019   Procedure: LEFT CAROTID ENDARTERECTOMY;  Surgeon: Chuck Hintickson, Tinisha Etzkorn S, MD;  [facility];  Service: Vascular;  Laterality: Left;   PATCH ANGIOPLASTY Left 12/03/2019   Procedure: PATCH ANGIOPLASTY Left Carotid Artery;  Surgeon: Chuck Hintickson, Rachele Lamaster S, MD;  [facility];  Service: Vascular;  Laterality: Left;    There were no vitals filed for this visit.   Subjective Assessment - 05/28/21 0949     Subjective Doing okay today. No energy    Pertinent History multiple CVA's, CKD,    Limitations Walking;Standing    Patient Stated Goals improved balance                               [facility] Adult PT Treatment/Exercise - 05/28/21 0001       Exercises   Exercises Knee/Hip      Knee/Hip Exercises: Seated   Long Arc Quad Strengthening;Both;2 sets;10 reps    Long Arc Quad Weight 2 lbs.    Ball Squeeze x10 hold 5    Clamshell with TheraBand Yellow   2x10   Marching AROM;AAROM;Both;3 sets;10 reps   AAROM RT LE                         PT Long  Term Goals - 05/26/21 1313       PT LONG TERM GOAL #1   Title Patient will be independent with HEP    Time 6    Period Weeks    Status New    Target Date 07/07/21      PT LONG TERM GOAL #2   Title Patient will be able to independently transfer from sitting to standing with at most close supervision for increased independence.    Time 6    Period Weeks    Status New    Target Date 07/07/21      PT LONG TERM GOAL #3   Title Patient will improve her TUG time to 90 seconds (1:30) or less for improved mobility.    Time 6    Period Weeks    Status New    Target Date 07/07/21      PT LONG TERM GOAL #4   Title Patient will be able to ambulate with a step through pattern for improved gait  speed and mobiltiy.    Time 6    Period Weeks    Status New    Target Date 07/07/21                   Plan - 05/28/21 0951     Clinical Impression Statement Pt arrived today with her daughter and requested BP be taken in sitting and then standing and document please. Today seated was 128/59 and 110/58 in standing.Pt was able to complete exs today, but with frequent rest periods. Progress seated exs and add standing exs as tolerated    Personal Factors and Comorbidities Time since onset of injury/illness/exacerbation;Transportation;Other;Comorbidity 3+    Comorbidities history of multiple CVAs, HTN, DM, R LE weakness, blind left eye, Carotid artery occlusion, CKD    Examination-Activity Limitations Transfers;Locomotion Level;Stand    Examination-Participation Restrictions Other    Stability/Clinical Decision Making Evolving/Moderate complexity    Rehab Potential Fair    PT Frequency 2x / week    PT Duration 6 weeks    PT Treatment/Interventions ADLs/Self Care Home Management;Gait training;Stair training;Functional mobility training;Therapeutic activities;Therapeutic exercise;Balance training;Neuromuscular re-education;Manual techniques;Passive range of motion;Patient/family education     PT Next Visit Plan upper and LE strengthening, standing interventions (gait belt and close supervision),   Take BP seated and then standing before Therex and document    Consulted and Agree with Plan of Care Patient;Family member/caregiver    Family Member Consulted daughter             Patient will benefit from skilled therapeutic intervention in order to improve the following deficits and impairments:  Abnormal gait, Difficulty walking, Decreased range of motion, Decreased activity tolerance, Decreased balance, Decreased strength, Postural dysfunction, Impaired sensation  Visit Diagnosis: Muscle weakness (generalized)  Difficulty in walking, not elsewhere classified  Unsteadiness on feet     Problem List Patient Active Problem List   Diagnosis Date Noted   Hyponatremia 10/07/2020   AKI (acute kidney injury) ([place]) 10/07/2020   Acute kidney injury superimposed on CKD ([place]) 10/07/2020   Dehydration with hyponatremia    Syncope 10/06/2020   History of CVA (cerebrovascular accident) 07/10/2020   Pure hypercholesterolemia 07/10/2020   Type 2 diabetes mellitus with complication, without long-term current use of insulin ([place]) 07/10/2020   Malnutrition of moderate degree 06/27/2020   Stage 3b chronic kidney disease ([place]) 06/25/2020   CAP (community acquired pneumonia) 06/25/2020   Stenosis of left carotid artery 12/03/2019   Stroke ([place])    Pain    Acute ischemic right posterior cerebral artery (PCA) stroke ([place]) 11/09/2019   Essential hypertension    Controlled type 2 diabetes mellitus with hyperglycemia (HCC)    Postherpetic neuralgia    Uncontrolled type 2 diabetes mellitus with hyperglycemia, without long-term current use of insulin ([place]) 11/07/2019   Hypertensive urgency 11/07/2019   Stenosis of left internal carotid artery 11/07/2019   Intractable nausea and vomiting 11/07/2019   Nicotine dependence, cigarettes, uncomplicated Q000111Q   HZV (herpes zoster virus) post  herpetic neuralgia 11/07/2019   Weakness of right lower extremity 11/07/2019   CVA (cerebral vascular accident) ([place]) 11/07/2019    Shantia Sanford,CHRIS, PTA 05/28/2021, 2:02 PM  [facility] [address] [place], [place], [address] Phone: [phone]   Fax:  [phone]  Name: TALEEAH DAUGHETY MRN: [id number] Date of Birth: 1945/09/30

## 2021-06-02 ENCOUNTER — Encounter: Payer: Self-pay | Admitting: Physical Therapy

## 2021-06-02 ENCOUNTER — Ambulatory Visit: Payer: PPO | Admitting: Physical Therapy

## 2021-06-02 DIAGNOSIS — R262 Difficulty in walking, not elsewhere classified: Secondary | ICD-10-CM

## 2021-06-02 DIAGNOSIS — M6281 Muscle weakness (generalized): Secondary | ICD-10-CM

## 2021-06-02 DIAGNOSIS — R2681 Unsteadiness on feet: Secondary | ICD-10-CM

## 2021-06-02 NOTE — Therapy (Signed)
[facility] [place], [place], [address] Phone: [phone]   Fax:  [phone]  Physical Therapy Treatment  Patient Details  Name: Chelsea [name] MRN: [id number] Date of Birth: 03/18/1945 Referring Provider (PT): Antony Contras, MD   Encounter Date: 06/02/2021   PT End of Session - 06/02/21 1034     Visit Number 3    Number of Visits 12    Date for PT Re-Evaluation 08/07/21    PT Start Time 1033    PT Stop Time 1103    PT Time Calculation (min) 30 min    Activity Tolerance Patient limited by fatigue    Behavior During Therapy [facility] for tasks assessed/performed             Past Medical History:  Diagnosis Date   Allergy    Anemia    Blind left eye    Cardiac arrest [facility])    Carotid artery occlusion    Cataract    Depression    Diabetes mellitus without complication ([place])    Hyperlipidemia    Hypertension    Oxygen deficiency    Stroke [facility])     Past Surgical History:  Procedure Laterality Date   ENDARTERECTOMY Left 12/03/2019   Procedure: LEFT CAROTID ENDARTERECTOMY;  Surgeon: Angelia Mould, MD;  Location: [facility];  Service: Vascular;  Laterality: Left;   PATCH ANGIOPLASTY Left 12/03/2019   Procedure: PATCH ANGIOPLASTY Left Carotid Artery;  Surgeon: Angelia Mould, MD;  Location: [facility];  Service: Vascular;  Laterality: Left;    There were no vitals filed for this visit.   Subjective Assessment - 06/02/21 1033     Subjective Daughter reports that she feels more off balance today "like she's on a boat all the time."    Pertinent History multiple CVA's, CKD,    Limitations Walking;Standing    Patient Stated Goals improved balance    Currently in Pain? No/denies                [facility] PT Assessment - 06/02/21 0001       Assessment   Medical Diagnosis Gait difficulty    Referring Provider (PT) Antony Contras, MD    Hand Dominance Right    Next MD Visit 11/26/21    Prior Therapy  yes      Precautions   Precautions Fall      Restrictions   Weight Bearing Restrictions No                           [facility] Adult PT Treatment/Exercise - 06/02/21 0001       Knee/Hip Exercises: Aerobic   Nustep L1 x10 min      Knee/Hip Exercises: Standing   Hip Flexion AROM;Both;2 sets;10 reps;Knee bent      Knee/Hip Exercises: Seated   Long Arc Quad Strengthening;Both;2 sets;10 reps    Clamshell with TheraBand --   AROM x20breps   Other Seated Knee/Hip Exercises B heel/toe raise    Marching AROM;AAROM;Both;15 reps    Marching Limitations AAROM for RLE    Sit to Sand 10 reps;with UE support                          PT Long Term Goals - 05/26/21 1313       PT LONG TERM GOAL #1   Title Patient will be independent with HEP    Time 6  Period Weeks    Status New    Target Date 07/07/21      PT LONG TERM GOAL #2   Title Patient will be able to independently transfer from sitting to standing with at most close supervision for increased independence.    Time 6    Period Weeks    Status New    Target Date 07/07/21      PT LONG TERM GOAL #3   Title Patient will improve her TUG time to 90 seconds (1:30) or less for improved mobility.    Time 6    Period Weeks    Status New    Target Date 07/07/21      PT LONG TERM GOAL #4   Title Patient will be able to ambulate with a step through pattern for improved gait speed and mobiltiy.    Time 6    Period Weeks    Status New    Target Date 07/07/21                   Plan - 06/02/21 1216     Clinical Impression Statement Patient presented in clinic with the help of her daughter who reported dizzy symptoms were very prominant today. Patient guided through seated exercises due to global weakness but especially RLE. Patient reports compliance with previous HEPs at home and able to complete hip flexion better in standing. Patient was quite fatigued after therex. Patient encouraged to  think of safety and rest as needed to not overexceed her tolerance.    Personal Factors and Comorbidities Time since onset of injury/illness/exacerbation;Transportation;Other;Comorbidity 3+    Comorbidities history of multiple CVAs, HTN, DM, R LE weakness, blind left eye, Carotid artery occlusion, CKD    Examination-Activity Limitations Transfers;Locomotion Level;Stand    Examination-Participation Restrictions Other    Stability/Clinical Decision Making Evolving/Moderate complexity    Rehab Potential Fair    PT Frequency 2x / week    PT Duration 6 weeks    PT Treatment/Interventions ADLs/Self Care Home Management;Gait training;Stair training;Functional mobility training;Therapeutic activities;Therapeutic exercise;Balance training;Neuromuscular re-education;Manual techniques;Passive range of motion;Patient/family education    PT Next Visit Plan upper and LE strengthening, standing interventions (gait belt and close supervision),   Take BP seated and then standing before Therex and document    Consulted and Agree with Plan of Care Patient             Patient will benefit from skilled therapeutic intervention in order to improve the following deficits and impairments:  Abnormal gait, Difficulty walking, Decreased range of motion, Decreased activity tolerance, Decreased balance, Decreased strength, Postural dysfunction, Impaired sensation  Visit Diagnosis: Muscle weakness (generalized)  Difficulty in walking, not elsewhere classified  Unsteadiness on feet     Problem List Patient Active Problem List   Diagnosis Date Noted   Hyponatremia 10/07/2020   AKI (acute kidney injury) ([place]) 10/07/2020   Acute kidney injury superimposed on CKD ([place]) 10/07/2020   Dehydration with hyponatremia    Syncope 10/06/2020   History of CVA (cerebrovascular accident) 07/10/2020   Pure hypercholesterolemia 07/10/2020   Type 2 diabetes mellitus with complication, without long-term current use of insulin  ([place]) 07/10/2020   Malnutrition of moderate degree 06/27/2020   Stage 3b chronic kidney disease ([place]) 06/25/2020   CAP (community acquired pneumonia) 06/25/2020   Stenosis of left carotid artery 12/03/2019   Stroke [facility])    Pain    Acute ischemic right posterior cerebral artery (PCA) stroke ([place]) 11/09/2019   Essential hypertension  Controlled type 2 diabetes mellitus with hyperglycemia (HCC)    Postherpetic neuralgia    Uncontrolled type 2 diabetes mellitus with hyperglycemia, without long-term current use of insulin ([place]) 11/07/2019   Hypertensive urgency 11/07/2019   Stenosis of left internal carotid artery 11/07/2019   Intractable nausea and vomiting 11/07/2019   Nicotine dependence, cigarettes, uncomplicated Q000111Q   HZV (herpes zoster virus) post herpetic neuralgia 11/07/2019   Weakness of right lower extremity 11/07/2019   CVA (cerebral vascular accident) [facility]) 11/07/2019    Standley Brooking, PTA 06/02/2021, 12:18 PM  [facility] [place], [place], [address] Phone: [phone]   Fax:  [phone]  Name: Chelsea [name] MRN: [id number] Date of Birth: 1945-02-27

## 2021-06-04 ENCOUNTER — Ambulatory Visit: Payer: PPO | Admitting: *Deleted

## 2021-06-04 DIAGNOSIS — M6281 Muscle weakness (generalized): Secondary | ICD-10-CM

## 2021-06-04 DIAGNOSIS — R2681 Unsteadiness on feet: Secondary | ICD-10-CM

## 2021-06-04 DIAGNOSIS — R262 Difficulty in walking, not elsewhere classified: Secondary | ICD-10-CM | POA: Diagnosis not present

## 2021-06-04 DIAGNOSIS — I63531 Cerebral infarction due to unspecified occlusion or stenosis of right posterior cerebral artery: Secondary | ICD-10-CM

## 2021-06-04 NOTE — Therapy (Signed)
[facility] [place], [place], [address] Phone: [phone]   Fax:  [phone]  Physical Therapy Treatment  Patient Details  Name: Chelsea [name] MRN: [id number] Date of Birth: [birth year] Referring Provider (PT): Antony Contras, MD   Encounter Date: 06/04/2021   PT End of Session - 06/04/21 0956     Visit Number 4    Number of Visits 12    Date for PT Re-Evaluation 08/07/21    PT Start Time 0945    PT Stop Time [id number]    PT Time Calculation (min) 50 min    Equipment Utilized During Treatment Gait belt    Activity Tolerance Patient limited by fatigue    Behavior During Therapy [facility] for tasks assessed/performed             Past Medical History:  Diagnosis Date   Allergy    Anemia    Blind left eye    Cardiac arrest [facility])    Carotid artery occlusion    Cataract    Depression    Diabetes mellitus without complication ([place])    Hyperlipidemia    Hypertension    Oxygen deficiency    Stroke [facility])     Past Surgical History:  Procedure Laterality Date   ENDARTERECTOMY Left 12/03/2019   Procedure: LEFT CAROTID ENDARTERECTOMY;  Surgeon: Angelia Mould, MD;  Location: [place];  Service: Vascular;  Laterality: Left;   PATCH ANGIOPLASTY Left 12/03/2019   Procedure: PATCH ANGIOPLASTY Left Carotid Artery;  Surgeon: Angelia Mould, MD;  Location: [place];  Service: Vascular;  Laterality: Left;    There were no vitals filed for this visit.   Subjective Assessment - 06/04/21 0955     Subjective Feeling dizzy today    Pertinent History multiple CVA's, CKD,    Limitations Walking;Standing    Patient Stated Goals improved balance    Currently in Pain? No/denies                               [facility] Adult PT Treatment/Exercise - 06/04/21 0001       Exercises   Exercises Knee/Hip      Knee/Hip Exercises: Aerobic   Nustep L1 x10 min      Knee/Hip Exercises: Seated   Long Arc Quad  Strengthening;Both;2 sets;10 reps    Long Arc Quad Weight 2 lbs.    Ball Squeeze x10 hold 5    Clamshell with TheraBand Green   3x10   Other Seated Knee/Hip Exercises B heel/toe raise    Marching AROM;AAROM;Both;15 reps    Hamstring Curl Strengthening;Both;3 sets;10 reps   green band                         PT Long Term Goals - 05/26/21 1313       PT LONG TERM GOAL #1   Title Patient will be independent with HEP    Time 6    Period Weeks    Status New    Target Date 07/07/21      PT LONG TERM GOAL #2   Title Patient will be able to independently transfer from sitting to standing with at most close supervision for increased independence.    Time 6    Period Weeks    Status New    Target Date 07/07/21      PT LONG TERM GOAL #3   Title  Patient will improve her TUG time to 90 seconds (1:30) or less for improved mobility.    Time 6    Period Weeks    Status New    Target Date 07/07/21      PT LONG TERM GOAL #4   Title Patient will be able to ambulate with a step through pattern for improved gait speed and mobiltiy.    Time 6    Period Weeks    Status New    Target Date 07/07/21                   Plan - 06/04/21 0958     Clinical Impression Statement BP seated 141/64 and standing122/68 today. Pt still feeling like her balance is off today so therex was  performed seated for LE strengthening. Pt able to perform exs with increased resistance today, but requested no standing exs. Progress as tolerated.    Personal Factors and Comorbidities Time since onset of injury/illness/exacerbation;Transportation;Other;Comorbidity 3+    Comorbidities history of multiple CVAs, HTN, DM, R LE weakness, blind left eye, Carotid artery occlusion, CKD    Examination-Activity Limitations Transfers;Locomotion Level;Stand    Examination-Participation Restrictions Other    Stability/Clinical Decision Making Evolving/Moderate complexity    Rehab Potential Fair    PT  Frequency 2x / week    PT Duration 6 weeks    PT Treatment/Interventions ADLs/Self Care Home Management;Gait training;Stair training;Functional mobility training;Therapeutic activities;Therapeutic exercise;Balance training;Neuromuscular re-education;Manual techniques;Passive range of motion;Patient/family education    PT Next Visit Plan upper and LE strengthening, standing interventions (gait belt and close supervision),   Take BP seated and then standing before Therex and document             Patient will benefit from skilled therapeutic intervention in order to improve the following deficits and impairments:  Abnormal gait, Difficulty walking, Decreased range of motion, Decreased activity tolerance, Decreased balance, Decreased strength, Postural dysfunction, Impaired sensation  Visit Diagnosis: Muscle weakness (generalized)  Difficulty in walking, not elsewhere classified  Unsteadiness on feet  Acute ischemic right posterior cerebral artery (PCA) stroke (HCC)     Problem List Patient Active Problem List   Diagnosis Date Noted   Hyponatremia 10/07/2020   AKI (acute kidney injury) ([place]) 10/07/2020   Acute kidney injury superimposed on CKD ([place]) 10/07/2020   Dehydration with hyponatremia    Syncope 10/06/2020   History of CVA (cerebrovascular accident) 07/10/2020   Pure hypercholesterolemia 07/10/2020   Type 2 diabetes mellitus with complication, without long-term current use of insulin ([place]) 07/10/2020   Malnutrition of moderate degree 06/27/2020   Stage 3b chronic kidney disease ([place]) 06/25/2020   CAP (community acquired pneumonia) 06/25/2020   Stenosis of left carotid artery 12/03/2019   Stroke [facility])    Pain    Acute ischemic right posterior cerebral artery (PCA) stroke ([place]) 11/09/2019   Essential hypertension    Controlled type 2 diabetes mellitus with hyperglycemia ([place])    Postherpetic neuralgia    Uncontrolled type 2 diabetes mellitus with hyperglycemia, without  long-term current use of insulin ([place]) 11/07/2019   Hypertensive urgency 11/07/2019   Stenosis of left internal carotid artery 11/07/2019   Intractable nausea and vomiting 11/07/2019   Nicotine dependence, cigarettes, uncomplicated Q000111Q   HZV (herpes zoster virus) post herpetic neuralgia 11/07/2019   Weakness of right lower extremity 11/07/2019   CVA (cerebral vascular accident) ([place]) 11/07/2019    Caeden Foots,CHRIS, PTA 06/04/2021, 5:04 PM  [facility] [place],  [place], [address] Phone: [phone]   Fax:  [phone]  Name: Chelsea [name] MRN: [id number] Date of Birth: 1945/06/17

## 2021-06-09 ENCOUNTER — Ambulatory Visit: Payer: PPO | Admitting: *Deleted

## 2021-06-09 DIAGNOSIS — R2681 Unsteadiness on feet: Secondary | ICD-10-CM

## 2021-06-09 DIAGNOSIS — M6281 Muscle weakness (generalized): Secondary | ICD-10-CM

## 2021-06-09 DIAGNOSIS — R262 Difficulty in walking, not elsewhere classified: Secondary | ICD-10-CM

## 2021-06-09 NOTE — Therapy (Signed)
[facility] [address] [place], [place], [address] Phone: [phone]   Fax:  [phone]  Physical Therapy Treatment  Patient Details  Name: Chelsea [name] MRN: [id number] Date of Birth: 11/01/1945 Referring Provider (PT): Delia Heady, MD   Encounter Date: 06/09/2021   PT End of Session - 06/09/21 1050     Visit Number 5    Number of Visits 12    Date for PT Re-Evaluation 08/07/21    PT Start Time 1030    PT Stop Time 1118    PT Time Calculation (min) 48 min             Past Medical History:  Diagnosis Date   Allergy    Anemia    Blind left eye    Cardiac arrest [facility])    Carotid artery occlusion    Cataract    Depression    Diabetes mellitus without complication (HCC)    Hyperlipidemia    Hypertension    Oxygen deficiency    Stroke [facility])     Past Surgical History:  Procedure Laterality Date   ENDARTERECTOMY Left 12/03/2019   Procedure: LEFT CAROTID ENDARTERECTOMY;  Surgeon: Chuck Hint, MD;  Location: [facility] OR;  Service: Vascular;  Laterality: Left;   PATCH ANGIOPLASTY Left 12/03/2019   Procedure: PATCH ANGIOPLASTY Left Carotid Artery;  Surgeon: Chuck Hint, MD;  Location: [facility] OR;  Service: Vascular;  Laterality: Left;    There were no vitals filed for this visit.   Subjective Assessment - 06/09/21 1053     Subjective Feeling dizzy still. My leg feels heavy some times    Pertinent History multiple CVA's, CKD,    Limitations Walking;Standing    Patient Stated Goals improved balance    Currently in Pain? No/denies                               [facility] Adult PT Treatment/Exercise - 06/09/21 0001       Exercises   Exercises Knee/Hip      Knee/Hip Exercises: Aerobic   Nustep L1 x10 min      Knee/Hip Exercises: Standing   Heel Raises Both;2 sets;10 reps    Knee Flexion Both;2 sets;10 reps    Hip Flexion AROM;Both;2 sets;10 reps;Knee bent    Hip Abduction  AROM;Both;2 sets;10 reps    Hip Extension AROM;2 sets;10 reps    Functional Squat 2 sets;10 reps   holding on 2 hands   Other Standing Knee Exercises balnce: one step holds. HHA      Knee/Hip Exercises: Seated   Long Arc Quad Strengthening;Both;2 sets;10 reps    Long Arc Quad Weight 3 lbs.    Clamshell with TheraBand Green   3x10   Hamstring Curl Strengthening;Both;10 reps;1 set   green band                         PT Long Term Goals - 05/26/21 1313       PT LONG TERM GOAL #1   Title Patient will be independent with HEP    Time 6    Period Weeks    Status New    Target Date 07/07/21      PT LONG TERM GOAL #2   Title Patient will be able to independently transfer from sitting to standing with at most close supervision for increased independence.    Time 6  Period Weeks    Status New    Target Date 07/07/21      PT LONG TERM GOAL #3   Title Patient will improve her TUG time to 90 seconds (1:30) or less for improved mobility.    Time 6    Period Weeks    Status New    Target Date 07/07/21      PT LONG TERM GOAL #4   Title Patient will be able to ambulate with a step through pattern for improved gait speed and mobiltiy.    Time 6    Period Weeks    Status New    Target Date 07/07/21                   Plan - 06/09/21 1051     Clinical Impression Statement BP 137/59 sitting and standing112/66. Pt did much better today and was able to perform standing exs as well as seated  today. one step hold balance exs were very challenging for Pt and needs CGA at all times.    Personal Factors and Comorbidities Time since onset of injury/illness/exacerbation;Transportation;Other;Comorbidity 3+    Comorbidities history of multiple CVAs, HTN, DM, R LE weakness, blind left eye, Carotid artery occlusion, CKD    Examination-Activity Limitations Transfers;Locomotion Level;Stand    Stability/Clinical Decision Making Evolving/Moderate complexity    Rehab Potential  Fair    PT Duration 6 weeks    PT Treatment/Interventions ADLs/Self Care Home Management;Gait training;Stair training;Functional mobility training;Therapeutic activities;Therapeutic exercise;Balance training;Neuromuscular re-education;Manual techniques;Passive range of motion;Patient/family education    PT Next Visit Plan upper and LE strengthening, standing interventions (gait belt and close supervision),   Take BP seated and then standing before Therex and document    Consulted and Agree with Plan of Care Patient             Patient will benefit from skilled therapeutic intervention in order to improve the following deficits and impairments:  Abnormal gait, Difficulty walking, Decreased range of motion, Decreased activity tolerance, Decreased balance, Decreased strength, Postural dysfunction, Impaired sensation  Visit Diagnosis: Muscle weakness (generalized)  Unsteadiness on feet  Difficulty in walking, not elsewhere classified     Problem List Patient Active Problem List   Diagnosis Date Noted   Hyponatremia 10/07/2020   AKI (acute kidney injury) ([place]) 10/07/2020   Acute kidney injury superimposed on CKD ([place]) 10/07/2020   Dehydration with hyponatremia    Syncope 10/06/2020   History of CVA (cerebrovascular accident) 07/10/2020   Pure hypercholesterolemia 07/10/2020   Type 2 diabetes mellitus with complication, without long-term current use of insulin ([place]) 07/10/2020   Malnutrition of moderate degree 06/27/2020   Stage 3b chronic kidney disease ([place]) 06/25/2020   CAP (community acquired pneumonia) 06/25/2020   Stenosis of left carotid artery 12/03/2019   Stroke ([place])    Pain    Acute ischemic right posterior cerebral artery (PCA) stroke ([place]) 11/09/2019   Essential hypertension    Controlled type 2 diabetes mellitus with hyperglycemia ([place])    Postherpetic neuralgia    Uncontrolled type 2 diabetes mellitus with hyperglycemia, without long-term current use of insulin  ([place]) 11/07/2019   Hypertensive urgency 11/07/2019   Stenosis of left internal carotid artery 11/07/2019   Intractable nausea and vomiting 11/07/2019   Nicotine dependence, cigarettes, uncomplicated Q000111Q   HZV (herpes zoster virus) post herpetic neuralgia 11/07/2019   Weakness of right lower extremity 11/07/2019   CVA (cerebral vascular accident) ([place]) 11/07/2019    Oneil Behney,CHRIS, PTA 06/09/2021, 12:13 PM  [facility] [place], [place], [address] Phone: [phone]   Fax:  [phone]  Name: Chelsea [name] MRN: [id number] Date of Birth: 21-Jan-1945

## 2021-06-11 ENCOUNTER — Institutional Professional Consult (permissible substitution): Payer: PPO | Admitting: Neurology

## 2021-06-15 ENCOUNTER — Ambulatory Visit: Payer: PPO | Attending: Neurology

## 2021-06-15 DIAGNOSIS — R2681 Unsteadiness on feet: Secondary | ICD-10-CM | POA: Diagnosis present

## 2021-06-15 DIAGNOSIS — R262 Difficulty in walking, not elsewhere classified: Secondary | ICD-10-CM | POA: Diagnosis present

## 2021-06-15 DIAGNOSIS — M6281 Muscle weakness (generalized): Secondary | ICD-10-CM | POA: Diagnosis present

## 2021-06-15 NOTE — Therapy (Signed)
[facility] [place], [place], [address] Phone: [phone]   Fax:  [phone]  Physical Therapy Treatment  Patient Details  Name: Chelsea [name] MRN: [id number] Date of Birth: [birth year] Referring Provider (PT): Antony Contras, MD   Encounter Date: 06/15/2021   PT End of Session - 06/15/21 [id number]     Visit Number 6    Number of Visits 12    Date for PT Re-Evaluation 08/07/21    PT Start Time 41    PT Stop Time 1345    PT Time Calculation (min) 45 min             Past Medical History:  Diagnosis Date   Allergy    Anemia    Blind left eye    Cardiac arrest [facility])    Carotid artery occlusion    Cataract    Depression    Diabetes mellitus without complication ([place])    Hyperlipidemia    Hypertension    Oxygen deficiency    Stroke [facility])     Past Surgical History:  Procedure Laterality Date   ENDARTERECTOMY Left 12/03/2019   Procedure: LEFT CAROTID ENDARTERECTOMY;  Surgeon: Angelia Mould, MD;  Location: [place];  Service: Vascular;  Laterality: Left;   PATCH ANGIOPLASTY Left 12/03/2019   Procedure: PATCH ANGIOPLASTY Left Carotid Artery;  Surgeon: Angelia Mould, MD;  Location: [place];  Service: Vascular;  Laterality: Left;    There were no vitals filed for this visit.   Subjective Assessment - 06/15/21 1306     Subjective Pt arrives for today's treatment session denying any pain.    Pertinent History multiple CVA's, CKD,    Limitations Walking;Standing    Patient Stated Goals improved balance    Currently in Pain? No/denies                               [facility] Adult PT Treatment/Exercise - 06/15/21 0001       Knee/Hip Exercises: Aerobic   Nustep Lvl 2 x 15 mins      Knee/Hip Exercises: Standing   Heel Raises Both;2 sets;10 reps    Knee Flexion Both;2 sets;10 reps    Hip Flexion AROM;Both;2 sets;10 reps;Knee bent    Hip Abduction AROM;Both;2 sets;10 reps    Hip  Extension AROM;2 sets;10 reps    Functional Squat 2 sets;10 reps   holding on with 2 hands   Other Standing Knee Exercises Side-stepping x 5 times at the sink      Knee/Hip Exercises: Seated   Long Arc Quad Strengthening;Both;2 sets;10 reps    Long Arc Quad Weight 3 lbs.    Ball Squeeze 20reps w 5 sec hold    Clamshell with Marga Hoots   25 reps   Marching Both;20 reps;Weights    Marching Limitations Difficulty with RLE    Marching Weights 3 lbs.    Hamstring Curl Strengthening;Both;10 reps;1 set    Hamstring Limitations green tband    Sit to Sand 10 reps;with UE support                          PT Long Term Goals - 05/26/21 1313       PT LONG TERM GOAL #1   Title Patient will be independent with HEP    Time 6    Period Weeks    Status New    Target  Date 07/07/21      PT LONG TERM GOAL #2   Title Patient will be able to independently transfer from sitting to standing with at most close supervision for increased independence.    Time 6    Period Weeks    Status New    Target Date 07/07/21      PT LONG TERM GOAL #3   Title Patient will improve her TUG time to 90 seconds (1:30) or less for improved mobility.    Time 6    Period Weeks    Status New    Target Date 07/07/21      PT LONG TERM GOAL #4   Title Patient will be able to ambulate with a step through pattern for improved gait speed and mobiltiy.    Time 6    Period Weeks    Status New    Target Date 07/07/21                   Plan - 06/15/21 1308     Clinical Impression Statement Pt arrives for today's treatment session denying any pain.  Pt able to tolerate standing exercises today without need for rest break.  Pt demonstrates early fatigue with RLE seated exercises.  Pt denies any pain at completion of today's treatment session, but does report fatigue.  Pt encouraged to perform exercises at home as previously instructed.    Personal Factors and Comorbidities Time since onset of  injury/illness/exacerbation;Transportation;Other;Comorbidity 3+    Comorbidities history of multiple CVAs, HTN, DM, R LE weakness, blind left eye, Carotid artery occlusion, CKD    Examination-Activity Limitations Transfers;Locomotion Level;Stand    Stability/Clinical Decision Making Evolving/Moderate complexity    Rehab Potential Fair    PT Duration 6 weeks    PT Treatment/Interventions ADLs/Self Care Home Management;Gait training;Stair training;Functional mobility training;Therapeutic activities;Therapeutic exercise;Balance training;Neuromuscular re-education;Manual techniques;Passive range of motion;Patient/family education    PT Next Visit Plan upper and LE strengthening, standing interventions (gait belt and close supervision),   Take BP seated and then standing before Therex and document    Consulted and Agree with Plan of Care Patient             Patient will benefit from skilled therapeutic intervention in order to improve the following deficits and impairments:  Abnormal gait, Difficulty walking, Decreased range of motion, Decreased activity tolerance, Decreased balance, Decreased strength, Postural dysfunction, Impaired sensation  Visit Diagnosis: Unsteadiness on feet  Muscle weakness (generalized)     Problem List Patient Active Problem List   Diagnosis Date Noted   Hyponatremia 10/07/2020   AKI (acute kidney injury) ([place]) 10/07/2020   Acute kidney injury superimposed on CKD ([place]) 10/07/2020   Dehydration with hyponatremia    Syncope 10/06/2020   History of CVA (cerebrovascular accident) 07/10/2020   Pure hypercholesterolemia 07/10/2020   Type 2 diabetes mellitus with complication, without long-term current use of insulin ([place]) 07/10/2020   Malnutrition of moderate degree 06/27/2020   Stage 3b chronic kidney disease ([place]) 06/25/2020   CAP (community acquired pneumonia) 06/25/2020   Stenosis of left carotid artery 12/03/2019   Stroke ([place])    Pain    Acute ischemic  right posterior cerebral artery (PCA) stroke ([place]) 11/09/2019   Essential hypertension    Controlled type 2 diabetes mellitus with hyperglycemia ([place])    Postherpetic neuralgia    Uncontrolled type 2 diabetes mellitus with hyperglycemia, without long-term current use of insulin ([place]) 11/07/2019   Hypertensive urgency 11/07/2019   Stenosis of  left internal carotid artery 11/07/2019   Intractable nausea and vomiting 11/07/2019   Nicotine dependence, cigarettes, uncomplicated Q000111Q   HZV (herpes zoster virus) post herpetic neuralgia 11/07/2019   Weakness of right lower extremity 11/07/2019   CVA (cerebral vascular accident) [facility]) 11/07/2019   Rationale for Evaluation and Treatment [place], PTA 06/15/2021, 1:57 PM  [facility] [address] [place], [place], [address] Phone: [phone]   Fax:  [phone]  Name: LIANI LEBEOUF MRN: [id number] Date of Birth: 11-Feb-1945

## 2021-06-18 ENCOUNTER — Ambulatory Visit: Payer: PPO

## 2021-06-18 DIAGNOSIS — M6281 Muscle weakness (generalized): Secondary | ICD-10-CM

## 2021-06-18 DIAGNOSIS — R2681 Unsteadiness on feet: Secondary | ICD-10-CM

## 2021-06-18 DIAGNOSIS — R262 Difficulty in walking, not elsewhere classified: Secondary | ICD-10-CM

## 2021-06-18 NOTE — Therapy (Signed)
[facility] [place], [place], [address] Phone: [phone]   Fax:  [phone]  Physical Therapy Treatment  Patient Details  Name: Chelsea [name] MRN: [id number] Date of Birth: [birth year] Referring Provider (PT): Antony Contras, MD   Encounter Date: 06/18/2021   PT End of Session - 06/18/21 0949     Visit Number 7    Number of Visits 12    Date for PT Re-Evaluation 08/07/21    PT Start Time [id number]    PT Stop Time 1028    PT Time Calculation (min) 41 min    Activity Tolerance Patient limited by fatigue    Behavior During Therapy [facility] for tasks assessed/performed             Past Medical History:  Diagnosis Date   Allergy    Anemia    Blind left eye    Cardiac arrest [facility])    Carotid artery occlusion    Cataract    Depression    Diabetes mellitus without complication ([place])    Hyperlipidemia    Hypertension    Oxygen deficiency    Stroke [facility])     Past Surgical History:  Procedure Laterality Date   ENDARTERECTOMY Left 12/03/2019   Procedure: LEFT CAROTID ENDARTERECTOMY;  Surgeon: Angelia Mould, MD;  Location: [place];  Service: Vascular;  Laterality: Left;   PATCH ANGIOPLASTY Left 12/03/2019   Procedure: PATCH ANGIOPLASTY Left Carotid Artery;  Surgeon: Angelia Mould, MD;  Location: [place];  Service: Vascular;  Laterality: Left;    There were no vitals filed for this visit.   Subjective Assessment - 06/18/21 0949     Subjective Patient reports that she feels alright, but she still feels a little dizzy. She notes that her neurologist is not sure why she is dizzy, but it seems to be getting better. She notes that she is tired this morning from doing her HEP.    Pertinent History multiple CVA's, CKD,    Limitations Walking;Standing    Patient Stated Goals improved balance    Currently in Pain? No/denies                [facility] PT Assessment - 06/18/21 0001       Special Tests   Other  special tests Eye tracking: [facility] and Head Inpulse: WFL                           [facility] Adult PT Treatment/Exercise - 06/18/21 0001       Knee/Hip Exercises: Aerobic   Nustep L3 x 15 minutes      Knee/Hip Exercises: Standing   Heel Raises Both;20 reps    Hip Flexion Both;20 reps;Knee bent      Knee/Hip Exercises: Seated   Ball Squeeze 5 second hold for 2 minutes                          PT Long Term Goals - 05/26/21 1313       PT LONG TERM GOAL #1   Title Patient will be independent with HEP    Time 6    Period Weeks    Status New    Target Date 07/07/21      PT LONG TERM GOAL #2   Title Patient will be able to independently transfer from sitting to standing with at most close supervision for increased independence.  Time 6    Period Weeks    Status New    Target Date 07/07/21      PT LONG TERM GOAL #3   Title Patient will improve her TUG time to 90 seconds (1:30) or less for improved mobility.    Time 6    Period Weeks    Status New    Target Date 07/07/21      PT LONG TERM GOAL #4   Title Patient will be able to ambulate with a step through pattern for improved gait speed and mobiltiy.    Time 6    Period Weeks    Status New    Target Date 07/07/21                   Plan - 06/18/21 0951     Clinical Impression Statement Patient presented to treatment with increased fatigue due to performing her HEP prior to treatment. This significantly limited her ability to complete today's interventions. She required multiple seated rest breaks throughout treatment due to fatigue. She reported feeling tired upon the conclusion of treatment. She continues to require skilled physical therapy to address her remaining impairments to maximize her functional mobility and safety.    Personal Factors and Comorbidities Time since onset of injury/illness/exacerbation;Transportation;Other;Comorbidity 3+    Comorbidities history of multiple  CVAs, HTN, DM, R LE weakness, blind left eye, Carotid artery occlusion, CKD    Examination-Activity Limitations Transfers;Locomotion Level;Stand    Stability/Clinical Decision Making Evolving/Moderate complexity    Rehab Potential Fair    PT Duration 6 weeks    PT Treatment/Interventions ADLs/Self Care Home Management;Gait training;Stair training;Functional mobility training;Therapeutic activities;Therapeutic exercise;Balance training;Neuromuscular re-education;Manual techniques;Passive range of motion;Patient/family education    PT Next Visit Plan upper and LE strengthening, standing interventions (gait belt and close supervision),   Take BP seated and then standing before Therex and document    Consulted and Agree with Plan of Care Patient             Patient will benefit from skilled therapeutic intervention in order to improve the following deficits and impairments:  Abnormal gait, Difficulty walking, Decreased range of motion, Decreased activity tolerance, Decreased balance, Decreased strength, Postural dysfunction, Impaired sensation  Visit Diagnosis: Unsteadiness on feet  Muscle weakness (generalized)  Difficulty in walking, not elsewhere classified     Problem List Patient Active Problem List   Diagnosis Date Noted   Hyponatremia 10/07/2020   AKI (acute kidney injury) (HCC) 10/07/2020   Acute kidney injury superimposed on CKD (HCC) 10/07/2020   Dehydration with hyponatremia    Syncope 10/06/2020   History of CVA (cerebrovascular accident) 07/10/2020   Pure hypercholesterolemia 07/10/2020   Type 2 diabetes mellitus with complication, without long-term current use of insulin (HCC) 07/10/2020   Malnutrition of moderate degree 06/27/2020   Stage 3b chronic kidney disease (HCC) 06/25/2020   CAP (community acquired pneumonia) 06/25/2020   Stenosis of left carotid artery 12/03/2019   Stroke (HCC)    Pain    Acute ischemic right posterior cerebral artery (PCA) stroke (HCC)  11/09/2019   Essential hypertension    Controlled type 2 diabetes mellitus with hyperglycemia (HCC)    Postherpetic neuralgia    Uncontrolled type 2 diabetes mellitus with hyperglycemia, without long-term current use of insulin (HCC) 11/07/2019   Hypertensive urgency 11/07/2019   Stenosis of left internal carotid artery 11/07/2019   Intractable nausea and vomiting 11/07/2019   Nicotine dependence, cigarettes, uncomplicated 11/07/2019   HZV (herpes zoster  virus) post herpetic neuralgia 11/07/2019   Weakness of right lower extremity 11/07/2019   CVA (cerebral vascular accident) [facility]) 11/07/2019   Rationale for Evaluation and Treatment Rehabilitation   Darlin Coco, PT 06/18/2021, 5:00 PM  [facility] [place], [place], [address] Phone: [phone]   Fax:  [phone]  Name: Chelsea [name] MRN: [id number] Date of Birth: [birth year]

## 2021-06-22 ENCOUNTER — Ambulatory Visit: Payer: PPO | Admitting: Physical Therapy

## 2021-06-22 ENCOUNTER — Encounter: Payer: Self-pay | Admitting: Physical Therapy

## 2021-06-22 ENCOUNTER — Telehealth: Payer: Self-pay

## 2021-06-22 DIAGNOSIS — R262 Difficulty in walking, not elsewhere classified: Secondary | ICD-10-CM

## 2021-06-22 DIAGNOSIS — M6281 Muscle weakness (generalized): Secondary | ICD-10-CM

## 2021-06-22 DIAGNOSIS — R2681 Unsteadiness on feet: Secondary | ICD-10-CM

## 2021-06-22 NOTE — Therapy (Signed)
[facility] [place], [place], [address] Phone: [phone]   Fax:  [phone]  Physical Therapy Treatment  Patient Details  Name: Chelsea [name] MRN: [id number] Date of Birth: 10/22/1945 Referring Provider (PT): Antony Contras, MD   Encounter Date: 06/22/2021   PT End of Session - 06/22/21 1007     Visit Number 8    Number of Visits 12    Date for PT Re-Evaluation 08/07/21    PT Start Time 0949    PT Stop Time 1028    PT Time Calculation (min) 39 min    Activity Tolerance Patient limited by fatigue    Behavior During Therapy [facility] for tasks assessed/performed             Past Medical History:  Diagnosis Date   Allergy    Anemia    Blind left eye    Cardiac arrest [facility])    Carotid artery occlusion    Cataract    Depression    Diabetes mellitus without complication ([place])    Hyperlipidemia    Hypertension    Oxygen deficiency    Stroke [facility])     Past Surgical History:  Procedure Laterality Date   ENDARTERECTOMY Left 12/03/2019   Procedure: LEFT CAROTID ENDARTERECTOMY;  Surgeon: Angelia Mould, MD;  Location: [place];  Service: Vascular;  Laterality: Left;   PATCH ANGIOPLASTY Left 12/03/2019   Procedure: PATCH ANGIOPLASTY Left Carotid Artery;  Surgeon: Angelia Mould, MD;  Location: [place];  Service: Vascular;  Laterality: Left;    There were no vitals filed for this visit.   Subjective Assessment - 06/22/21 0956     Subjective Reports that she had already done some exercises prior to PT today.    Pertinent History multiple CVA's, CKD,    Limitations Walking;Standing    Patient Stated Goals improved balance    Currently in Pain? No/denies                [facility] PT Assessment - 06/22/21 0001       Assessment   Medical Diagnosis Gait difficulty    Referring Provider (PT) Antony Contras, MD    Hand Dominance Right    Next MD Visit 11/26/21    Prior Therapy yes      Precautions    Precautions [facility] Adult PT Treatment/Exercise - 06/22/21 0001       Ambulation/Gait   Ambulation/Gait Yes    Ambulation/Gait Assistance 4: Min guard    Ambulation Distance (Feet) 80 Feet    Assistive device Rolling walker    Gait Pattern Step-through pattern;Decreased stride length;Decreased dorsiflexion - right;Right foot flat;Right genu recurvatum;Shuffle;Trunk flexed;Narrow base of support;Poor foot clearance - right    Ambulation Surface Level;Indoor      Knee/Hip Exercises: Aerobic   Nustep L3 x 18 minutes      Knee/Hip Exercises: Seated   Long Arc Quad Strengthening;Both;20 reps;Weights    Long Arc Quad Weight 3 lbs.    Marching Strengthening;Both;20 reps;Weights    Marching Weights 3 lbs.    Hamstring Curl AROM;Both;20 reps    Sit to Sand 10 reps;with UE support                          PT Long Term Goals - 05/26/21 1313  PT LONG TERM GOAL #1   Title Patient will be independent with HEP    Time 6    Period Weeks    Status New    Target Date 07/07/21      PT LONG TERM GOAL #2   Title Patient will be able to independently transfer from sitting to standing with at most close supervision for increased independence.    Time 6    Period Weeks    Status New    Target Date 07/07/21      PT LONG TERM GOAL #3   Title Patient will improve her TUG time to 90 seconds (1:30) or less for improved mobility.    Time 6    Period Weeks    Status New    Target Date 07/07/21      PT LONG TERM GOAL #4   Title Patient will be able to ambulate with a step through pattern for improved gait speed and mobiltiy.    Time 6    Period Weeks    Status New    Target Date 07/07/21                   Plan - 06/22/21 1507     Clinical Impression Statement Patient arrived reporting compliance with HEP but fatigued prior to arrive due to completion of HEP. Patient still fatigues quickly and experiences R knee  hyperextension periodically with RLE stance phase. Patient able to complete sit to stands fairly well from elevated plinth but as she fatigued she began to experience more instability and difficulty.    Personal Factors and Comorbidities Time since onset of injury/illness/exacerbation;Transportation;Other;Comorbidity 3+    Comorbidities history of multiple CVAs, HTN, DM, R LE weakness, blind left eye, Carotid artery occlusion, CKD    Examination-Activity Limitations Transfers;Locomotion Level;Stand    Examination-Participation Restrictions Other    Stability/Clinical Decision Making Evolving/Moderate complexity    Rehab Potential Fair    PT Frequency 2x / week    PT Duration 6 weeks    PT Treatment/Interventions ADLs/Self Care Home Management;Gait training;Stair training;Functional mobility training;Therapeutic activities;Therapeutic exercise;Balance training;Neuromuscular re-education;Manual techniques;Passive range of motion;Patient/family education    PT Next Visit Plan upper and LE strengthening, standing interventions (gait belt and close supervision),   Take BP seated and then standing before Therex and document    Consulted and Agree with Plan of Care Patient    Family Member Consulted daughter             Patient will benefit from skilled therapeutic intervention in order to improve the following deficits and impairments:  Abnormal gait, Difficulty walking, Decreased range of motion, Decreased activity tolerance, Decreased balance, Decreased strength, Postural dysfunction, Impaired sensation  Visit Diagnosis: Unsteadiness on feet  Muscle weakness (generalized)  Difficulty in walking, not elsewhere classified     Problem List Patient Active Problem List   Diagnosis Date Noted   Hyponatremia 10/07/2020   AKI (acute kidney injury) (HCC) 10/07/2020   Acute kidney injury superimposed on CKD (HCC) 10/07/2020   Dehydration with hyponatremia    Syncope 10/06/2020   History of  CVA (cerebrovascular accident) 07/10/2020   Pure hypercholesterolemia 07/10/2020   Type 2 diabetes mellitus with complication, without long-term current use of insulin (HCC) 07/10/2020   Malnutrition of moderate degree 06/27/2020   Stage 3b chronic kidney disease (HCC) 06/25/2020   CAP (community acquired pneumonia) 06/25/2020   Stenosis of left carotid artery 12/03/2019   Stroke (HCC)    Pain  Acute ischemic right posterior cerebral artery (PCA) stroke ([place]) 11/09/2019   Essential hypertension    Controlled type 2 diabetes mellitus with hyperglycemia (HCC)    Postherpetic neuralgia    Uncontrolled type 2 diabetes mellitus with hyperglycemia, without long-term current use of insulin ([place]) 11/07/2019   Hypertensive urgency 11/07/2019   Stenosis of left internal carotid artery 11/07/2019   Intractable nausea and vomiting 11/07/2019   Nicotine dependence, cigarettes, uncomplicated [id number]   HZV (herpes zoster virus) post herpetic neuralgia 11/07/2019   Weakness of right lower extremity 11/07/2019   CVA (cerebral vascular accident) [facility]) 11/07/2019   Rationale for Evaluation and [place], PTA 06/22/2021, 3:11 PM  [facility] [place], [place], [address] Phone: [phone]   Fax:  [phone]  Name: Chelsea [name] MRN: [id number] Date of Birth: [birth year]

## 2021-06-25 ENCOUNTER — Other Ambulatory Visit: Payer: Self-pay | Admitting: Cardiology

## 2021-06-25 ENCOUNTER — Encounter: Payer: PPO | Admitting: Physical Therapy

## 2021-06-25 NOTE — Telephone Encounter (Signed)
Rx request sent to pharmacy.  

## 2021-07-08 ENCOUNTER — Encounter: Payer: Self-pay | Admitting: Physical Therapy

## 2021-07-08 ENCOUNTER — Ambulatory Visit: Payer: PPO | Admitting: Physical Therapy

## 2021-07-08 DIAGNOSIS — R2681 Unsteadiness on feet: Secondary | ICD-10-CM

## 2021-07-08 DIAGNOSIS — R262 Difficulty in walking, not elsewhere classified: Secondary | ICD-10-CM

## 2021-07-08 DIAGNOSIS — M6281 Muscle weakness (generalized): Secondary | ICD-10-CM

## 2021-07-08 NOTE — Therapy (Signed)
[facility] [place], [place], [address] Phone: [phone]   Fax:  [phone]  Physical Therapy Treatment  Patient Details  Name: Chelsea [name] MRN: [id number] Date of Birth: 02/25/1945 Referring Provider (PT): Antony Contras, MD   Encounter Date: 07/08/2021   PT End of Session - 07/08/21 0945     Visit Number 9    Number of Visits 12    Date for PT Re-Evaluation 08/07/21    PT Start Time 0948    PT Stop Time 1028    PT Time Calculation (min) 40 min    Equipment Utilized During Treatment Other (comment)   FWW   Activity Tolerance Patient limited by fatigue    Behavior During Therapy [facility] for tasks assessed/performed             Past Medical History:  Diagnosis Date   Allergy    Anemia    Blind left eye    Cardiac arrest [facility])    Carotid artery occlusion    Cataract    Depression    Diabetes mellitus without complication ([place])    Hyperlipidemia    Hypertension    Oxygen deficiency    Stroke [facility])     Past Surgical History:  Procedure Laterality Date   ENDARTERECTOMY Left 12/03/2019   Procedure: LEFT CAROTID ENDARTERECTOMY;  Surgeon: Angelia Mould, MD;  Location: [place];  Service: Vascular;  Laterality: Left;   PATCH ANGIOPLASTY Left 12/03/2019   Procedure: PATCH ANGIOPLASTY Left Carotid Artery;  Surgeon: Angelia Mould, MD;  Location: [place];  Service: Vascular;  Laterality: Left;    There were no vitals filed for this visit.   Subjective Assessment - 07/08/21 0945     Subjective Compliant with HEP 2-3 times per day.    Pertinent History multiple CVA's, CKD,    Limitations Walking;Standing    Patient Stated Goals improved balance    Currently in Pain? No/denies                [facility] PT Assessment - 07/08/21 0001       Assessment   Medical Diagnosis Gait difficulty    Referring Provider (PT) Antony Contras, MD    Hand Dominance Right    Next MD Visit 11/26/21    Prior  Therapy yes      Precautions   Precautions [place]                           [facility] Adult PT Treatment/Exercise - 07/08/21 0001       Knee/Hip Exercises: Aerobic   Nustep L3 x15 min      Knee/Hip Exercises: Seated   Long Arc Quad Strengthening;Both;20 reps;Weights    Long Arc Quad Weight 3 lbs.    Clamshell with TheraBand Red   x20 reps   Marching Strengthening;Both;20 reps    Marching Limitations Attempting more AROM on RLE    Marching Weights 3 lbs.    Hamstring Curl Strengthening;Both;20 reps;Limitations    Hamstring Limitations red theraband    Sit to Sand 15 reps;with UE support                          PT Long Term Goals - 05/26/21 1313       PT LONG TERM GOAL #1   Title Patient will be independent with HEP    Time 6    Period Weeks  Status New    Target Date 07/07/21      PT LONG TERM GOAL #2   Title Patient will be able to independently transfer from sitting to standing with at most close supervision for increased independence.    Time 6    Period Weeks    Status New    Target Date 07/07/21      PT LONG TERM GOAL #3   Title Patient will improve her TUG time to 90 seconds (1:30) or less for improved mobility.    Time 6    Period Weeks    Status New    Target Date 07/07/21      PT LONG TERM GOAL #4   Title Patient will be able to ambulate with a step through pattern for improved gait speed and mobiltiy.    Time 6    Period Weeks    Status New    Target Date 07/07/21                   Plan - 07/08/21 1042     Clinical Impression Statement Patient maintains that she is compliant with HEP while at home for 2-3 times per day sometimes. Patient able to demonstrate greater R hip flexion without resistance. Patient very nervous without FWW close by for safety. Patient fatigued by end of the treatment session.    Personal Factors and Comorbidities Time since onset of  injury/illness/exacerbation;Transportation;Other;Comorbidity 3+    Comorbidities history of multiple CVAs, HTN, DM, R LE weakness, blind left eye, Carotid artery occlusion, CKD    Examination-Activity Limitations Transfers;Locomotion Level;Stand    Examination-Participation Restrictions Other    Stability/Clinical Decision Making Evolving/Moderate complexity    Rehab Potential Fair    PT Frequency 2x / week    PT Duration 6 weeks    PT Treatment/Interventions ADLs/Self Care Home Management;Gait training;Stair training;Functional mobility training;Therapeutic activities;Therapeutic exercise;Balance training;Neuromuscular re-education;Manual techniques;Passive range of motion;Patient/family education    PT Next Visit Plan upper and LE strengthening, standing interventions (gait belt and close supervision),   Take BP seated and then standing before Therex and document    Consulted and Agree with Plan of Care Patient             Patient will benefit from skilled therapeutic intervention in order to improve the following deficits and impairments:  Abnormal gait, Difficulty walking, Decreased range of motion, Decreased activity tolerance, Decreased balance, Decreased strength, Postural dysfunction, Impaired sensation  Visit Diagnosis: Unsteadiness on feet  Muscle weakness (generalized)  Difficulty in walking, not elsewhere classified     Problem List Patient Active Problem List   Diagnosis Date Noted   Hyponatremia 10/07/2020   AKI (acute kidney injury) (HCC) 10/07/2020   Acute kidney injury superimposed on CKD (HCC) 10/07/2020   Dehydration with hyponatremia    Syncope 10/06/2020   History of CVA (cerebrovascular accident) 07/10/2020   Pure hypercholesterolemia 07/10/2020   Type 2 diabetes mellitus with complication, without long-term current use of insulin (HCC) 07/10/2020   Malnutrition of moderate degree 06/27/2020   Stage 3b chronic kidney disease (HCC) 06/25/2020   CAP  (community acquired pneumonia) 06/25/2020   Stenosis of left carotid artery 12/03/2019   Stroke (HCC)    Pain    Acute ischemic right posterior cerebral artery (PCA) stroke (HCC) 11/09/2019   Essential hypertension    Controlled type 2 diabetes mellitus with hyperglycemia (HCC)    Postherpetic neuralgia    Uncontrolled type 2 diabetes mellitus with hyperglycemia, without long-term current  use of insulin (HCC) 11/07/2019   Hypertensive urgency 11/07/2019   Stenosis of left internal carotid artery 11/07/2019   Intractable nausea and vomiting 11/07/2019   Nicotine dependence, cigarettes, uncomplicated 11/07/2019   HZV (herpes zoster virus) post herpetic neuralgia 11/07/2019   Weakness of right lower extremity 11/07/2019   CVA (cerebral vascular accident) [facility]) 11/07/2019   Rationale for Evaluation and Treatment Rehabilitation   Marvell Fuller, [place] 07/08/2021, 12:24 PM  [facility] [address] [place], [place], [address] Phone: [phone]   Fax:  [phone]  Name: Chelsea [name] MRN: [id number] Date of Birth: March 27, 1945

## 2021-07-10 ENCOUNTER — Ambulatory Visit: Payer: PPO | Admitting: Physical Therapy

## 2021-07-10 ENCOUNTER — Encounter: Payer: Self-pay | Admitting: Physical Therapy

## 2021-07-10 DIAGNOSIS — R2681 Unsteadiness on feet: Secondary | ICD-10-CM

## 2021-07-10 DIAGNOSIS — R262 Difficulty in walking, not elsewhere classified: Secondary | ICD-10-CM

## 2021-07-10 DIAGNOSIS — M6281 Muscle weakness (generalized): Secondary | ICD-10-CM

## 2021-07-10 NOTE — Therapy (Addendum)
OUTPATIENT PHYSICAL THERAPY TREATMENT NOTE   Patient Name: Chelsea [name] MRN: [id number] DOB:[birth year], 76 y.o., female Today's Date: 07/10/2021   REFERRING PROVIDER: Micki Riley, MD   PT End of Session - 07/10/21 0909     Visit Number 10    Number of Visits 12    Date for PT Re-Evaluation 08/07/21    PT Start Time 0903    PT Stop Time 0944    PT Time Calculation (min) 41 min    Equipment Utilized During Treatment Other (comment)   FWW   Activity Tolerance Patient limited by fatigue    Behavior During Therapy [facility] for tasks assessed/performed               Past Medical History:  Diagnosis Date   Allergy    Anemia    Blind left eye    Cardiac arrest (HCC)    Carotid artery occlusion    Cataract    Depression    Diabetes mellitus without complication (HCC)    Hyperlipidemia    Hypertension    Oxygen deficiency    Stroke [facility])    Past Surgical History:  Procedure Laterality Date   ENDARTERECTOMY Left 12/03/2019   Procedure: LEFT CAROTID ENDARTERECTOMY;  Surgeon: Chuck Hint, MD;  Location: [facility] OR;  Service: Vascular;  Laterality: Left;   PATCH ANGIOPLASTY Left 12/03/2019   Procedure: PATCH ANGIOPLASTY Left Carotid Artery;  Surgeon: Chuck Hint, MD;  Location: [facility] OR;  Service: Vascular;  Laterality: Left;   Patient Active Problem List   Diagnosis Date Noted   Hyponatremia 10/07/2020   AKI (acute kidney injury) (HCC) 10/07/2020   Acute kidney injury superimposed on CKD (HCC) 10/07/2020   Dehydration with hyponatremia    Syncope 10/06/2020   History of CVA (cerebrovascular accident) 07/10/2020   Pure hypercholesterolemia 07/10/2020   Type 2 diabetes mellitus with complication, without long-term current use of insulin (HCC) 07/10/2020   Malnutrition of moderate degree 06/27/2020   Stage 3b chronic kidney disease (HCC) 06/25/2020   CAP (community acquired pneumonia) 06/25/2020   Stenosis of left carotid artery 12/03/2019   Stroke [facility])     Pain    Acute ischemic right posterior cerebral artery (PCA) stroke (HCC) 11/09/2019   Essential hypertension    Controlled type 2 diabetes mellitus with hyperglycemia (HCC)    Postherpetic neuralgia    Uncontrolled type 2 diabetes mellitus with hyperglycemia, without long-term current use of insulin (HCC) 11/07/2019   Hypertensive urgency 11/07/2019   Stenosis of left internal carotid artery 11/07/2019   Intractable nausea and vomiting 11/07/2019   Nicotine dependence, cigarettes, uncomplicated 11/07/2019   HZV (herpes zoster virus) post herpetic neuralgia 11/07/2019   Weakness of right lower extremity 11/07/2019   CVA (cerebral vascular accident) (HCC) 11/07/2019    REFERRING DIAG: Unsteadiness on feet; muscle weakness (generalized); difficulty in walking, not elsewhere classified  THERAPY DIAG:  Unsteadiness on feet  Muscle weakness (generalized)  Difficulty in walking, not elsewhere classified  Rationale for Evaluation and Treatment Rehabilitation  PERTINENT HISTORY: multiple CVA's, CKD  PRECAUTIONS: Fall  SUBJECTIVE: No new complaints.  PAIN:  Are you having pain? No     TODAY'S TREATMENT:                                     EXERCISE LOG  Exercise Repetitions and Resistance Comments  Nustep L3, seat 9 x13 min Intermittant rest breaks  LAQ 4# x20 reps;  Greater limitation with RLE  Seated marching  Alternating x20 reps Min assist with RLE  Sit to stand x10 reps B hand contact,        Blank cell = exercise not performed today     TUG test:  completed in 1 min 15 sec  Gait Assessment: One lap (260 ft); step through gait, less L foot clearance and LLE step length, slower turns in corners     PATIENT EDUCATION: Education details: Gait observations of less L foot clearance and POC for next two visits of more standing and balance Person educated: Patient and Child(ren) Education method: Explanation Education comprehension: verbalized  understanding      PT Long Term Goals - 07/10/21 0910       PT LONG TERM GOAL #1   Title Patient will be independent with HEP    Time 6    Period Weeks    Status Achieved   Target Date 07/07/21      PT LONG TERM GOAL #2   Title Patient will be able to independently transfer from sitting to standing with at most close supervision for increased independence.    Time 6    Period Weeks    Status Achieved   Target Date 07/07/21      PT LONG TERM GOAL #3   Title Patient will improve her TUG time to 90 seconds (1:30) or less for improved mobility.    Time 6    Period Weeks    Status Achieved   Target Date 07/07/21      PT LONG TERM GOAL #4   Title Patient will be able to ambulate with a step through pattern for improved gait speed and mobiltiy.    Time 6    Period Weeks    Status Partially achieved    Target Date 07/07/21            Progress Note Reporting Period 05/26/21 to 07/10/21  See note below for Objective Data and Assessment of Progress/Goals.    Patient is making good progress with skilled physical therapy as evidenced by her improved mobility and progress toward her goals. Recommend that she continue with skilled physical therapy to continue to improved her mobility and safety at home.   Jacqulynn Cadet, PT, DPT    Plan - 07/10/21 [id number]     Clinical Impression Statement Patient presented in clinic with no new complaints. All goals assessed today with patient able to achieve all except for gait goal. Patient ambulating with less L foot clearance due to the deficits of the RLE affected by the CVA. Patient educated that we will progress to more standing and balance in the next two visits in efforts to improve her confidence in the RLE to allow for greater L foot clearance. Patient's daughter also educated on this goal at end of session. Patient states that she completes her HEP 2-3 times per day and was encouaged to stay diligent with HEP even after discharged from PT.    Personal Factors and Comorbidities Time since onset of injury/illness/exacerbation;Transportation;Other;Comorbidity 3+    Comorbidities history of multiple CVAs, HTN, DM, R LE weakness, blind left eye, Carotid artery occlusion, CKD    Examination-Activity Limitations Transfers;Locomotion Level;Stand    Examination-Participation Restrictions Other    Stability/Clinical Decision Making Evolving/Moderate complexity    Rehab Potential Fair    PT Frequency 2x / week    PT Duration 6 weeks    PT Treatment/Interventions ADLs/Self Care  Home Management;Gait training;Stair training;Functional mobility training;Therapeutic activities;Therapeutic exercise;Balance training;Neuromuscular re-education;Manual techniques;Passive range of motion;Patient/family education    PT Next Visit Plan upper and LE strengthening, standing interventions (gait belt and close supervision),   Take BP seated and then standing before Therex and document    Consulted and Agree with Plan of Care Patient               Marvell Fuller, PTA 07/10/21 10:47 AM

## 2021-07-13 ENCOUNTER — Encounter: Payer: Self-pay | Admitting: *Deleted

## 2021-07-13 ENCOUNTER — Ambulatory Visit: Payer: PPO | Attending: Neurology | Admitting: *Deleted

## 2021-07-13 DIAGNOSIS — R2681 Unsteadiness on feet: Secondary | ICD-10-CM | POA: Diagnosis not present

## 2021-07-13 DIAGNOSIS — I63531 Cerebral infarction due to unspecified occlusion or stenosis of right posterior cerebral artery: Secondary | ICD-10-CM | POA: Diagnosis present

## 2021-07-13 DIAGNOSIS — R52 Pain, unspecified: Secondary | ICD-10-CM | POA: Insufficient documentation

## 2021-07-13 DIAGNOSIS — M6281 Muscle weakness (generalized): Secondary | ICD-10-CM | POA: Diagnosis present

## 2021-07-13 DIAGNOSIS — R262 Difficulty in walking, not elsewhere classified: Secondary | ICD-10-CM | POA: Diagnosis present

## 2021-07-13 NOTE — Therapy (Signed)
OUTPATIENT PHYSICAL THERAPY TREATMENT NOTE   Patient Name: Chelsea [name] MRN: [id number] DOB:May 01, 1945, 76 y.o., female Today's Date: 07/13/2021   REFERRING PROVIDER: Micki Riley, MD   PT End of Session - 07/10/21 0909     Visit Number 11   Number of Visits 12    Date for PT Re-Evaluation 08/07/21    PT Start Time 0945   PT Stop Time 1035   PT Time Calculation (min) 50 min    Equipment Utilized During Treatment Other (comment)   FWW   Activity Tolerance Patient limited by fatigue    Behavior During Therapy [facility] for tasks assessed/performed               Past Medical History:  Diagnosis Date   Allergy    Anemia    Blind left eye    Cardiac arrest (HCC)    Carotid artery occlusion    Cataract    Depression    Diabetes mellitus without complication (HCC)    Hyperlipidemia    Hypertension    Oxygen deficiency    Stroke [facility])    Past Surgical History:  Procedure Laterality Date   ENDARTERECTOMY Left 12/03/2019   Procedure: LEFT CAROTID ENDARTERECTOMY;  Surgeon: Chuck Hint, MD;  Location: [facility] OR;  Service: Vascular;  Laterality: Left;   PATCH ANGIOPLASTY Left 12/03/2019   Procedure: PATCH ANGIOPLASTY Left Carotid Artery;  Surgeon: Chuck Hint, MD;  Location: [facility] OR;  Service: Vascular;  Laterality: Left;   Patient Active Problem List   Diagnosis Date Noted   Hyponatremia 10/07/2020   AKI (acute kidney injury) (HCC) 10/07/2020   Acute kidney injury superimposed on CKD (HCC) 10/07/2020   Dehydration with hyponatremia    Syncope 10/06/2020   History of CVA (cerebrovascular accident) 07/10/2020   Pure hypercholesterolemia 07/10/2020   Type 2 diabetes mellitus with complication, without long-term current use of insulin (HCC) 07/10/2020   Malnutrition of moderate degree 06/27/2020   Stage 3b chronic kidney disease (HCC) 06/25/2020   CAP (community acquired pneumonia) 06/25/2020   Stenosis of left carotid artery 12/03/2019   Stroke [facility])     Pain    Acute ischemic right posterior cerebral artery (PCA) stroke (HCC) 11/09/2019   Essential hypertension    Controlled type 2 diabetes mellitus with hyperglycemia (HCC)    Postherpetic neuralgia    Uncontrolled type 2 diabetes mellitus with hyperglycemia, without long-term current use of insulin (HCC) 11/07/2019   Hypertensive urgency 11/07/2019   Stenosis of left internal carotid artery 11/07/2019   Intractable nausea and vomiting 11/07/2019   Nicotine dependence, cigarettes, uncomplicated 11/07/2019   HZV (herpes zoster virus) post herpetic neuralgia 11/07/2019   Weakness of right lower extremity 11/07/2019   CVA (cerebral vascular accident) (HCC) 11/07/2019   REFERRING DIAG: Unsteadiness on feet; muscle weakness (generalized); difficulty in walking, not elsewhere classified  THERAPY DIAG:  Unsteadiness on feet  Muscle weakness (generalized)  Difficulty in walking, not elsewhere classified  Acute ischemic right posterior cerebral artery (PCA) stroke (HCC)  Pain  Rationale for Evaluation and Treatment Rehabilitation  PERTINENT HISTORY: multiple CVA's, CKD  PRECAUTIONS: Fall  SUBJECTIVE: No new complaints.Did OK after last Rx  PAIN:  Are you having pain? No     TODAY'S TREATMENT:                                     EXERCISE LOG  Exercise Repetitions and  Resistance Comments  Nustep L4, seat 9 x15 min Intermittant rest breaks  LAQ 4#  3x10-15 reps; Bil. Greater limitation with RLE  Standing marching  Alternating x20 reps   Sit to stand X 5 reps with UE assist off low mat table  B hand contact      Balance: rocker board X 4 mins with SBA/CGA   One step holds X 2 each side with SBA/CGA        Blank cell = exercise not performed today              PT Long Term Goals - 07/10/21 0910       PT LONG TERM GOAL #1   Title Patient will be independent with HEP    Time 6    Period Weeks    Status Achieved   Target Date 07/07/21      PT LONG TERM GOAL  #2   Title Patient will be able to independently transfer from sitting to standing with at most close supervision for increased independence.    Time 6    Period Weeks    Status Achieved   Target Date 07/07/21      PT LONG TERM GOAL #3   Title Patient will improve her TUG time to 90 seconds (1:30) or less for improved mobility.    Time 6    Period Weeks    Status Achieved   Target Date 07/07/21      PT LONG TERM GOAL #4   Title Patient will be able to ambulate with a step through pattern for improved gait speed and mobiltiy.    Time 6    Period Weeks    Status Partially achieved    Target Date 07/07/21                Plan - 07/10/21 0914     Clinical Impression Statement Pt arrived today feeling better with energy. Rx focused on standing and sitting strengthening exs as well as balance exs today. SBA and CGA with all balance act.'s   Personal Factors and Comorbidities Time since onset of injury/illness/exacerbation;Transportation;Other;Comorbidity 3+    Comorbidities history of multiple CVAs, HTN, DM, R LE weakness, blind left eye, Carotid artery occlusion, CKD    Examination-Activity Limitations Transfers;Locomotion Level;Stand    Examination-Participation Restrictions Other    Stability/Clinical Decision Making Evolving/Moderate complexity    Rehab Potential Fair    PT Frequency 2x / week    PT Duration 6 weeks    PT Treatment/Interventions ADLs/Self Care Home Management;Gait training;Stair training;Functional mobility training;Therapeutic activities;Therapeutic exercise;Balance training;Neuromuscular re-education;Manual techniques;Passive range of motion;Patient/family education    PT Next Visit Plan upper and LE strengthening, standing interventions (gait belt and close supervision),   Take BP seated and then standing before Therex and document    Consulted and Agree with Plan of Care Patient               Gretta Cool, PTA 07/13/21 12:08 PM , PTA

## 2021-07-20 ENCOUNTER — Ambulatory Visit: Payer: PPO

## 2021-07-20 DIAGNOSIS — R2681 Unsteadiness on feet: Secondary | ICD-10-CM | POA: Diagnosis not present

## 2021-07-20 DIAGNOSIS — M6281 Muscle weakness (generalized): Secondary | ICD-10-CM

## 2021-07-20 DIAGNOSIS — R262 Difficulty in walking, not elsewhere classified: Secondary | ICD-10-CM

## 2021-07-20 NOTE — Therapy (Signed)
OUTPATIENT PHYSICAL THERAPY TREATMENT NOTE   Patient Name: Chelsea [name] MRN: [id number] DOB:01/04/1946, 76 y.o., female Today's Date: 07/20/2021   REFERRING PROVIDER: Garvin Fila, MD   PT End of Session - 07/10/21 0909     Visit Number 12   Number of Visits 12    Date for PT Re-Evaluation 08/07/21    PT Start Time 0945   PT Stop Time 1017   PT Time Calculation (min) 43 min    Equipment Utilized During Treatment Other (comment)   FWW   Activity Tolerance Patient limited by fatigue    Behavior During Therapy [facility] for tasks assessed/performed               Past Medical History:  Diagnosis Date   Allergy    Anemia    Blind left eye    Cardiac arrest ([place])    Carotid artery occlusion    Cataract    Depression    Diabetes mellitus without complication ([place])    Hyperlipidemia    Hypertension    Oxygen deficiency    Stroke [facility])    Past Surgical History:  Procedure Laterality Date   ENDARTERECTOMY Left 12/03/2019   Procedure: LEFT CAROTID ENDARTERECTOMY;  Surgeon: Angelia Mould, MD;  Location: [place];  Service: Vascular;  Laterality: Left;   PATCH ANGIOPLASTY Left 12/03/2019   Procedure: PATCH ANGIOPLASTY Left Carotid Artery;  Surgeon: Angelia Mould, MD;  Location: [place];  Service: Vascular;  Laterality: Left;   Patient Active Problem List   Diagnosis Date Noted   Hyponatremia 10/07/2020   AKI (acute kidney injury) ([place]) 10/07/2020   Acute kidney injury superimposed on CKD ([place]) 10/07/2020   Dehydration with hyponatremia    Syncope 10/06/2020   History of CVA (cerebrovascular accident) 07/10/2020   Pure hypercholesterolemia 07/10/2020   Type 2 diabetes mellitus with complication, without long-term current use of insulin ([place]) 07/10/2020   Malnutrition of moderate degree 06/27/2020   Stage 3b chronic kidney disease ([place]) 06/25/2020   CAP (community acquired pneumonia) 06/25/2020   Stenosis of left carotid artery 12/03/2019   Stroke [facility])     Pain    Acute ischemic right posterior cerebral artery (PCA) stroke ([place]) 11/09/2019   Essential hypertension    Controlled type 2 diabetes mellitus with hyperglycemia ([place])    Postherpetic neuralgia    Uncontrolled type 2 diabetes mellitus with hyperglycemia, without long-term current use of insulin ([place]) 11/07/2019   Hypertensive urgency 11/07/2019   Stenosis of left internal carotid artery 11/07/2019   Intractable nausea and vomiting 11/07/2019   Nicotine dependence, cigarettes, uncomplicated 51/02/5850   HZV (herpes zoster virus) post herpetic neuralgia 11/07/2019   Weakness of right lower extremity 11/07/2019   CVA (cerebral vascular accident) ([place]) 11/07/2019   REFERRING DIAG: Unsteadiness on feet; muscle weakness (generalized); difficulty in walking, not elsewhere classified  THERAPY DIAG:  Difficulty in walking, not elsewhere classified  Muscle weakness (generalized)  Rationale for Evaluation and Treatment Rehabilitation  PERTINENT HISTORY: multiple CVA's, CKD  PRECAUTIONS: Fall  SUBJECTIVE: Patient reports that she feels "pretty good."   PAIN:  Are you having pain? No     TODAY'S TREATMENT:                                    7/10 EXERCISE LOG  Exercise Repetitions and Resistance Comments  Nustep  L4 x 15 minutes    LAQ BLE; 5  lbs x 3 minutes    Sit to stand  10 reps with UE support   Marching  2 minutes with UE support         Blank cell = exercise not performed today                                     7/3 EXERCISE LOG  Exercise Repetitions and Resistance Comments  Nustep L4, seat 9 x15 min Intermittant rest breaks  LAQ 4#  3x10-15 reps; Bil. Greater limitation with RLE  Standing marching  Alternating x20 reps   Sit to stand X 5 reps with UE assist off low mat table  B hand contact      Balance: rocker board X 4 mins with SBA/CGA   One step holds X 2 each side with SBA/CGA        Blank cell = exercise not performed today               PT Long Term Goals - 07/10/21 0910       PT LONG TERM GOAL #1   Title Patient will be independent with HEP    Time 6    Period Weeks    Status Achieved   Target Date 07/07/21      PT LONG TERM GOAL #2   Title Patient will be able to independently transfer from sitting to standing with at most close supervision for increased independence.    Time 6    Period Weeks    Status Achieved   Target Date 07/07/21      PT LONG TERM GOAL #3   Title Patient will improve her TUG time to 90 seconds (1:30) or less for improved mobility.    Time 6    Period Weeks    Status Achieved   Target Date 07/07/21      PT LONG TERM GOAL #4   Title Patient will be able to ambulate with a step through pattern for improved gait speed and mobiltiy.    Time 6    Period Weeks    Status Achieved    Target Date 07/07/21                Plan - 07/10/21 0914     Clinical Impression Statement Treatment focused on familiar interventions and her HEP was reviewed. She reported feeling comfortable with these interventions and being discharged at this time. She had made good progress with skilled physical therapy as evidenced by her subjective reports and her ability to achieve her goals with physical therapy.    Personal Factors and Comorbidities Time since onset of injury/illness/exacerbation;Transportation;Other;Comorbidity 3+    Comorbidities history of multiple CVAs, HTN, DM, R LE weakness, blind left eye, Carotid artery occlusion, CKD    Examination-Activity Limitations Transfers;Locomotion Level;Stand    Examination-Participation Restrictions Other    Stability/Clinical Decision Making Evolving/Moderate complexity    Rehab Potential Fair    PT Frequency 2x / week    PT Duration 6 weeks    PT Treatment/Interventions ADLs/Self Care Home Management;Gait training;Stair training;Functional mobility training;Therapeutic activities;Therapeutic exercise;Balance training;Neuromuscular re-education;Manual  techniques;Passive range of motion;Patient/family education    PT Next Visit Plan upper and LE strengthening, standing interventions (gait belt and close supervision),   Take BP seated and then standing before Therex and document    Consulted and Agree with Plan of Care Patient  PHYSICAL THERAPY DISCHARGE SUMMARY  Visits from Start of Care: 12  Current functional level related to goals / functional outcomes: Patient is being discharged at this time as she has met her goals for physical therapy and reported feeling comfortable with her HEP.    Remaining deficits: Fatigue and lower extremity power   Education / Equipment: HEP    Patient agrees to discharge. Patient goals were met. Patient is being discharged due to meeting the stated rehab goals.   Jacqulynn Cadet, PT, DPT  07/20/21 10:46 AM

## 2021-07-28 NOTE — Telephone Encounter (Signed)
A user error has taken place: medication ordered in error, not dispensed to this patient.

## 2021-07-29 ENCOUNTER — Ambulatory Visit (INDEPENDENT_AMBULATORY_CARE_PROVIDER_SITE_OTHER): Payer: PPO | Admitting: Cardiology

## 2021-07-29 ENCOUNTER — Encounter (HOSPITAL_BASED_OUTPATIENT_CLINIC_OR_DEPARTMENT_OTHER): Payer: Self-pay | Admitting: Cardiology

## 2021-07-29 VITALS — BP 130/80 | HR 54 | Ht 65.0 in | Wt 148.0 lb

## 2021-07-29 DIAGNOSIS — E7800 Pure hypercholesterolemia, unspecified: Secondary | ICD-10-CM

## 2021-07-29 DIAGNOSIS — E118 Type 2 diabetes mellitus with unspecified complications: Secondary | ICD-10-CM

## 2021-07-29 DIAGNOSIS — N1832 Chronic kidney disease, stage 3b: Secondary | ICD-10-CM | POA: Diagnosis not present

## 2021-07-29 DIAGNOSIS — Z8673 Personal history of transient ischemic attack (TIA), and cerebral infarction without residual deficits: Secondary | ICD-10-CM

## 2021-07-29 DIAGNOSIS — I10 Essential (primary) hypertension: Secondary | ICD-10-CM | POA: Diagnosis not present

## 2021-07-29 MED ORDER — AMLODIPINE BESYLATE 2.5 MG PO TABS: 2.5000 mg | ORAL_TABLET | Freq: Every day | ORAL | 3 refills | Status: DC

## 2021-07-29 NOTE — Patient Instructions (Signed)

## 2021-07-29 NOTE — Progress Notes (Signed)
Cardiology Office Note:    Date:  07/29/2021   ID:  Chelsea [name], Chelsea [name] December 04, 1945, MRN [id number]  PCP:  [name], FNP  Cardiologist:  Buford Dresser, MD  Referring MD: [name], *   CC: follow up  History of Present Illness:    Chelsea [name] is a 76 y.o. female with a hx of CVA, cardiac arrest, carotid artery occlusion, diabetes mellitus without complication, hypertension, and stroke who is seen for follow up today. I initially met her 06/18/20 as a new consult at the request of [name], * for the evaluation and management of uncontrolled hypertension.  At the last visit in 05/25/2021: Note from Dr. [name] at [facility] from 03/10/21 reviewed. At that visit, olmesartan increased to 20 mg daily, amlodipine decreased to 5 mg daily, torsemide decreased to 40 mg daily due to LE edema. BP well controlled.  Today, she is accompanied with her daughter. Reports that everything has been well, and reports that her lightheadedness has resolved. Her daughter states that she had been going to physical therapy. She is able to do more activities independently. Her daughter states that she has been having an appetite. She no longer has a cough.   The patient denies chest pain, shortness of breath, nocturnal dyspnea, orthopnea or peripheral edema. There have been no palpitations, lightheadedness or syncope.   Current regimen: Amlodipine 2.5 mg in the AM (down from 5 mg) Carvedilol 3.125 mg BID Olmesartan 10 mg at night (cut in half after visit with Dr. [name]) Torsemide 20-40 mg in the AM  Past Medical History:  Diagnosis Date   Allergy    Anemia    Blind left eye    Cardiac arrest [facility])    Carotid artery occlusion    Cataract    Depression    Diabetes mellitus without complication ([place])    Hyperlipidemia    Hypertension    Oxygen deficiency    Stroke [facility])     Past Surgical History:  Procedure Laterality Date   ENDARTERECTOMY Left  12/03/2019   Procedure: LEFT CAROTID ENDARTERECTOMY;  Surgeon: Angelia Mould, MD;  Location: [place];  Service: Vascular;  Laterality: Left;   PATCH ANGIOPLASTY Left 12/03/2019   Procedure: PATCH ANGIOPLASTY Left Carotid Artery;  Surgeon: Angelia Mould, MD;  Location: [facility] OR;  Service: Vascular;  Laterality: Left;    Current Medications: Current Outpatient Medications on File Prior to Visit  Medication Sig   acetaminophen (TYLENOL) 500 MG tablet Take 1,000 mg by mouth every 6 (six) hours as needed for moderate pain.   amLODipine (NORVASC) 5 MG tablet Take 1 tablet (5 mg total) by mouth daily.   carvedilol (COREG) 3.125 MG tablet TAKE ONE TABLET TWICE DAILY WITH MEAL(S)   clopidogrel (PLAVIX) 75 MG tablet TAKE 1 TABLET ONCE DAILY   Elastic Bandages & Supports (MEDICAL COMPRESSION SOCKS) MISC 1 Units by Does not apply route daily.   Ferrous Sulfate (IRON) 325 (65 Fe) MG TABS Take 325 mg by mouth every other day.   guaiFENesin (MUCINEX) 600 MG 12 hr tablet Take 1 tablet (600 mg total) by mouth 2 (two) times daily.   metroNIDAZOLE (METROGEL) 0.75 % vaginal gel Place 1 Applicatorful vaginally 2 (two) times daily.   olmesartan (BENICAR) 20 MG tablet Take 10 mg by mouth daily.   rosuvastatin (CRESTOR) 5 MG tablet TAKE ONE TABLET EVERY OTHER DAY FOR FOURTEEN DAYS, THEN ONE DAILY   tamsulosin (FLOMAX) 0.4 MG CAPS capsule Take  0.4 mg by mouth every evening.   torsemide (DEMADEX) 20 MG tablet Take 40 mg by mouth daily.   No current facility-administered medications on file prior to visit.     Allergies:   Codeine, Latex, Penicillins, Sulfa antibiotics, Benzodiazepines, Lidocaine, Oxycodone-acetaminophen, Trazodone, Cephalexin, Gabapentin, Lipitor [atorvastatin], and Lyrica [pregabalin]   Social History   Tobacco Use   Smoking status: Former    Types: Cigarettes   Smokeless tobacco: Never  Vaping Use   Vaping Use: Never used  Substance Use Topics   Alcohol use: Not Currently    Drug use: Never    Family History: family history includes Arthritis in her brother, daughter, father, mother, and sister; COPD in her sister; Cancer in her mother; Depression in her brother, daughter, sister, and son; Diabetes in her brother, daughter, father, mother, sister, and son; Heart attack in her mother and sister; Heart disease in her brother and sister; Hyperlipidemia in her father and mother; Hypertension in her brother, daughter, father, mother, and sister; Stroke in her brother, daughter, and father.  ROS:   Please see the history of present illness. All other systems are reviewed and negative.    EKGs/Labs/Other Studies Reviewed:    The following studies were reviewed today: Bilateral Carotid Duplex 10/30/2020: Summary:  Right Carotid: Velocities in the right ICA are consistent with a 1-39%  stenosis.   Left Carotid: There is no evidence of stenosis in the left ICA.   Vertebrals:  Right vertebral artery demonstrates antegrade flow. Left  vertebral artery was not visualized.   Subclavians: Normal flow hemodynamics were seen in bilateral subclavian arteries.   Echo 10/07/20 1. Peak LV outflow tract velocity 2.7 m/s, peak gradient 29 mmHg (no  significant obstruction). Left ventricular ejection fraction, by  estimation, is 70 to 75%. The left ventricle has hyperdynamic function.  The left ventricle has no regional wall motion  abnormalities. There is mild left ventricular hypertrophy. Left  ventricular diastolic parameters are consistent with Grade I diastolic  dysfunction (impaired relaxation).   2. Right ventricular systolic function is normal. The right ventricular  size is normal.   3. The mitral valve is normal in structure. No evidence of mitral valve  regurgitation. No evidence of mitral stenosis. Moderate mitral annular  calcification.   4. The aortic valve is tricuspid. Aortic valve regurgitation is not  visualized. Mild aortic valve sclerosis is present,  with no evidence of  aortic valve stenosis.   5. The inferior vena cava is normal in size with greater than 50%  respiratory variability, suggesting right atrial pressure of 3 mmHg.   [place] LE Venous 12/12/2019: RIGHT:  - There is no evidence of deep vein thrombosis in the lower extremity.  - No cystic structure found in the popliteal fossa.     LEFT:  - No evidence of common femoral vein obstruction.  Echo 11/07/2019:  1. Left ventricular ejection fraction, by estimation, is 60 to 65%. The  left ventricle has normal function. The left ventricle has no regional  wall motion abnormalities. There is mild asymmetric left ventricular  hypertrophy of the basal-septal segment.  Left ventricular diastolic parameters are consistent with Grade I  diastolic dysfunction (impaired relaxation). Elevated left ventricular  end-diastolic pressure.   2. Right ventricular systolic function is normal. The right ventricular  size is normal.   3. Left atrial size was moderately dilated.   4. The mitral valve is normal in structure. Trivial mitral valve  regurgitation. No evidence of mitral stenosis. Moderate mitral  annular  calcification.   5. The aortic valve is tricuspid. There is mild calcification of the  aortic valve. There is mild thickening of the aortic valve. Aortic valve  regurgitation is not visualized. Mild aortic valve sclerosis is present,  with no evidence of aortic valve  stenosis.   6. The inferior vena cava is normal in size with greater than 50%  respiratory variability, suggesting right atrial pressure of 3 mmHg.   7. Agitated saline contrast bubble study was negative, with no evidence  of any interatrial shunt.   Comparison(s): No prior Echocardiogram.   Conclusion(s)/Recommendation(s): Normal biventricular function without  evidence of hemodynamically significant valvular heart disease. No  intracardiac source of embolism detected on this transthoracic study. A  transesophageal  echocardiogram is  recommended to exclude cardiac source of embolism if clinically indicated.   US Carotid Duplex 11/02/2019: Summary:  Right Carotid: Velocities in the right ICA are consistent with a 1-39%  stenosis.   Left Carotid: Velocities in the left ICA are consistent with a 80-99%  stenosis.   Vertebrals:  Bilateral vertebral arteries demonstrate antegrade flow.  Subclavians: Normal flow hemodynamics were seen in bilateral subclavian arteries.   EKG:  EKG was not ordered today  07/29/2021: sinus bradycardia at 54 bpm, LVH 06/18/2020: sinus bradycardia at 59 bpm, nonspecific t wave pattern  Recent Labs: 09/29/2020: ALT 8 10/09/2020: Magnesium 1.9 01/08/2021: BUN 58; Creatinine, Ser 2.12; Hemoglobin 11.7; Platelets 307; Potassium 4.0; Sodium 134   Recent Lipid Panel    Component Value Date/Time   CHOL 173 11/08/2019 0406   TRIG 82 11/08/2019 0406   HDL 36 (L) 11/08/2019 0406   CHOLHDL 4.8 11/08/2019 0406   VLDL 16 11/08/2019 0406   LDLCALC 121 (H) 11/08/2019 0406    Physical Exam:    VS:  BP 130/80   Pulse (!) 54   Ht 5' 5"  (1.651 m)   Wt 148 lb (67.1 kg)   BMI 24.63 kg/m     Wt Readings from Last 3 Encounters:  07/29/21 148 lb (67.1 kg)  05/25/21 147 lb (66.7 kg)  05/14/21 144 lb (65.3 kg)   GEN: Well nourished, well developed in no acute distress HEENT: Normal, moist mucous membranes NECK: No JVD CARDIAC: regular rhythm, normal S1 and S2, no rubs or gallops. No murmur. VASCULAR: Radial and DP pulses 2+ bilaterally. No carotid bruits RESPIRATORY:  Clear to auscultation without rales, wheezing or rhonchi  ABDOMEN: Soft, non-tender, non-distended MUSCULOSKELETAL:  Ambulates independently but using wheelchair SKIN: Warm and dry, no edema NEUROLOGIC:  Alert and oriented x 3. No focal neuro deficits noted. PSYCHIATRIC:  Normal affect   ASSESSMENT:    1. History of CVA (cerebrovascular accident)   2. Essential hypertension   3. Pure hypercholesterolemia    4. Stage 3b chronic kidney disease ([place])   5. Type 2 diabetes mellitus with complication, without long-term current use of insulin (HCC)    PLAN:    Lightheadedness/dizziness:  -resolved  History of CVA Carotid stenosis, left Hypercholesterolemia -continue clopidogrel -last LDL 121, goal <70 -she is currently on rosuvastatin 5 mg daily. We have discussed intensifying her regimen in the past, continue to discuss  Hypertension, labile -has a history of both very low and very high pressures, need to be cautious with med changes -taking amlodipine 2.5 mg daily, would like to not have to cut pill. Will order 2.5 mg tabs today -continue olmesartan, carvedilol, torsemide. Has come off of hydralazine.  Type II diabetes Chronic kidney disease, stage  3b (GFR 39) Former smoker -no edema on torsemide.  -With frequent UTIs, SGLT2i not ideal. Considered GLP1RA, though she has had issues with lack of appetite/weight loss, so would not start at this time.  Cardiac risk counseling and prevention recommendations: -recommend heart healthy/Mediterranean diet, with whole grains, fruits, vegetable, fish, lean meats, nuts, and olive oil. Limit salt. -limited mobility, cannot walk for significant distance -recommend avoidance of tobacco products. Avoid excess alcohol.  Plan for follow up: 6 months or sooner as needed  Buford Dresser, MD, PhD, [facility]    Medication Adjustments/Labs and Tests Ordered: Current medicines are reviewed at length with the patient today.  Concerns regarding medicines are outlined above.   Orders Placed This Encounter  Procedures   EKG 12-Lead   Meds ordered this encounter  Medications   amLODipine (NORVASC) 2.5 MG tablet    Sig: Take 1 tablet (2.5 mg total) by mouth daily.    Dispense:  90 tablet    Refill:  3   Patient Instructions  Medication Instructions:  Your Physician recommend you continue on your current medication as  directed.    *If you need a refill on your cardiac medications before your next appointment, please call your pharmacy*   Lab Work: None ordered today   Testing/Procedures: None ordered today   Follow-Up: At [facility], you and your health needs are our priority.  As part of our continuing mission to provide you with exceptional heart care, we have created designated Provider Care Teams.  These Care Teams include your primary Cardiologist (physician) and Advanced Practice Providers (APPs -  Physician Assistants and Nurse Practitioners) who all work together to provide you with the care you need, when you need it.  We recommend signing up for the patient portal called "MyChart".  Sign up information is provided on this After Visit Summary.  MyChart is used to connect with patients for Virtual Visits (Telemedicine).  Patients are able to view lab/test results, encounter notes, upcoming appointments, etc.  Non-urgent messages can be sent to your provider as well.   To learn more about what you can do with MyChart, go to [email or web address].    Your next appointment:   6 month(s)  The format for your next appointment:   In Person  Provider:   Buford Dresser, MD{          I,Tinashe Williams,acting as a scribe for Buford Dresser, MD.,have documented all relevant documentation on the behalf of Buford Dresser, MD,as directed by  Buford Dresser, MD while in the presence of Buford Dresser, MD.   I, Buford Dresser, MD, have reviewed all documentation for this visit. The documentation on 09/06/21 for the exam, diagnosis, procedures, and orders are all accurate and complete.   Buford Dresser, MD, PhD, [facility] at [facility] [address], [place], [place] [address] [phone]

## 2021-09-06 ENCOUNTER — Encounter (HOSPITAL_BASED_OUTPATIENT_CLINIC_OR_DEPARTMENT_OTHER): Payer: Self-pay | Admitting: Cardiology

## 2021-10-19 ENCOUNTER — Encounter: Payer: Self-pay | Admitting: *Deleted

## 2021-10-19 ENCOUNTER — Telehealth: Payer: Self-pay | Admitting: *Deleted

## 2021-10-19 NOTE — Telephone Encounter (Signed)
Letter has been sent to patient informing them that their nocturnal polysomnography order has expired. Patient will need to call and schedule an office visit to re-evaluate the need for a sleep study.

## 2021-11-26 ENCOUNTER — Encounter: Payer: Self-pay | Admitting: Neurology

## 2021-11-26 ENCOUNTER — Ambulatory Visit: Payer: PPO | Admitting: Neurology

## 2021-11-26 VITALS — BP 125/63 | HR 66 | Ht 65.0 in

## 2021-11-26 DIAGNOSIS — R42 Dizziness and giddiness: Secondary | ICD-10-CM

## 2021-11-26 DIAGNOSIS — Z8673 Personal history of transient ischemic attack (TIA), and cerebral infarction without residual deficits: Secondary | ICD-10-CM | POA: Diagnosis not present

## 2021-11-26 NOTE — Patient Instructions (Signed)
I had a long d/w patient and her daughter about her remote t stroke, dizziness and gait imbalance, risk for recurrent stroke/TIAs, personally independently reviewed imaging studies and stroke evaluation results and answered questions.Continue Plavix 75 mg daily for secondary stroke prevention and maintain strict control of hypertension with blood pressure goal below 130/90, diabetes with hemoglobin A1c goal below 6.5% and lipids with LDL cholesterol goal below 70 mg/dL. I also advised the patient to eat a healthy diet with plenty of whole grains, cereals, fruits and vegetables, exercise regularly and maintain ideal body weight .she was encouraged to use her walker at all times and we discussed fall and safety precautions.  Check screening carotid ultrasound and lipid profile and hemoglobin A1c.  Followup in the future with me in the future as needed only.  Fall Prevention in the Home, Adult Falls can cause injuries and affect people of all ages. There are many simple things that you can do to make your home safe and to help prevent falls. Ask for help when making these changes, if needed. What actions can I take to prevent falls? General instructions Use good lighting in all rooms. Replace any light bulbs that burn out, turn on lights if it is dark, and use night-lights. Place frequently used items in easy-to-reach places. Lower the shelves around your home if necessary. Set up furniture so that there are clear paths around it. Avoid moving your furniture around. Remove throw rugs and other tripping hazards from the floor. Avoid walking on wet floors. Fix any uneven floor surfaces. Add color or contrast paint or tape to grab bars and handrails in your home. Place contrasting color strips on the first and last steps of staircases. When you use a stepladder, make sure that it is completely opened and that the sides and supports are firmly locked. Have someone hold the ladder while you are using it. Do not  climb a closed stepladder. Know where your pets are when moving through your home. What can I do in the bathroom?     Keep the floor dry. Immediately clean up any water that is on the floor. Remove soap buildup in the tub or shower regularly. Use nonskid mats or decals on the floor of the tub or shower. Attach bath mats securely with double-sided, nonslip rug tape. If you need to sit down while you are in the shower, use a plastic, nonslip stool. Install grab bars by the toilet and in the tub and shower. Do not use towel bars as grab bars. What can I do in the bedroom? Make sure that a bedside light is easy to reach. Do not use oversized bedding that reaches the floor. Have a firm chair that has side arms to use for getting dressed. What can I do in the kitchen? Clean up any spills right away. If you need to reach for something above you, use a sturdy step stool that has a grab bar. Keep electrical cables out of the way. Do not use floor polish or wax that makes floors slippery. If you must use wax, make sure that it is non-skid floor wax. What can I do with my stairs? Do not leave any items on the stairs. Make sure that you have a light switch at the top and the bottom of the stairs. Have them installed if you do not have them. Make sure that there are handrails on both sides of the stairs. Fix handrails that are broken or loose. Make sure that handrails  are as long as the staircases. Install non-slip stair treads on all stairs in your home. Avoid having throw rugs at the top or bottom of stairs, or secure the rugs with carpet tape to prevent them from moving. Choose a carpet design that does not hide the edge of steps on the stairs. Check any carpeting to make sure that it is firmly attached to the stairs. Fix any carpet that is loose or worn. What can I do on the outside of my home? Use bright outdoor lighting. Regularly repair the edges of walkways and driveways and fix any  cracks. Remove high doorway thresholds. Trim any shrubbery on the main path into your home. Regularly check that handrails are securely fastened and in good repair. Both sides of all steps should have handrails. Install guardrails along the edges of any raised decks or porches. Clear walkways of debris and clutter, including tools and rocks. Have leaves, snow, and ice cleared regularly. Use sand or salt on walkways during winter months. In the garage, clean up any spills right away, including grease or oil spills. What other actions can I take? Wear closed-toe shoes that fit well and support your feet. Wear shoes that have rubber soles or low heels. Use mobility aids as needed, such as canes, walkers, scooters, and crutches. Review your medicines with your health care provider. Some medicines can cause dizziness or changes in blood pressure, which increase your risk of falling. Talk with your health care provider about other ways that you can decrease your risk of falls. This may include working with a physical therapist or trainer to improve your strength, balance, and endurance. Where to find more information Centers for Disease Control and Prevention, [employer]: FootballExhibition.com.br [employer] on Aging: https://walker.com/ Contact a health care provider if: You are afraid of falling at home. You feel weak, drowsy, or dizzy at home. You fall at home. Summary There are many simple things that you can do to make your home safe and to help prevent falls. Ways to make your home safe include removing tripping hazards and installing grab bars in the bathroom. Ask for help when making these changes in your home. This information is not intended to replace advice given to you by your health care provider. Make sure you discuss any questions you have with your health care provider. Document Revised: 09/29/2020 Document Reviewed: 08/01/2019 Elsevier Patient Education  2023 [employer].

## 2021-11-26 NOTE — Progress Notes (Signed)
[facility] [address] [place]. [place] [address] [phone]       OFFICE FOLLOW UP NOTE  [name] Date of Birth:  November 21, 1945 Medical Record Number:  [id number]   Referring MD: Dr. Antonietta [name]   Reason for Referral: Stroke   Chief Complaint  Patient presents with   Follow-up    Rm 14.     HPI:   Update 11/24/2020 JM: Patient presents for stroke follow-up after prior visit 6 months ago accompanied by daughter.  Patient is still using walker for right-sided weakness and remains unable to cook or perform ADLs independently. Vision remains blurred. She is continuing physical/occupational therapy once a week. Denies falls. Continuing Plavix and Crestor without side effects.  Routinely follows with [facility] for carotid stenosis. Is seeing Chelsea Red, MD with cardiology for hypertension management and recurrent syncopal events with f/u scheduled 11/17. Presented to ED 4 days ago for lightheadedness and hypotension, deemed vasovagal. Bps remain labile - routinely monitored at home typically SBP 140s but largely fluctuate . She has a cardiology appointment in 3 days.      History provided for reference purposes only  Update 05/22/2020 JM: Chelsea [name] returns for stroke follow-up accompanied by her daughter after prior visit 3 months ago with Chelsea [name].  She has been doing well since prior visit recently started working with PT for gait and balance impairment. She continues to use rolling walker and denies any recent falls.  Continues to have mild right sided weakness since her stroke with some improvement since prior visit.  Denies new stroke/TIA symptoms.  Compliant on Plavix without associated side effects Self discontinued atorvastatin due to continued nausea and vomiting which subsided after stopping.  PCP recently started omega-3 but she has been tolerating without side effects. Blood pressure today 160/78 - monitors at home and has been uncontrolled  with PCP managing  Recent LDL 87; recent A1c 6.2  Daughter is frustrated regarding continued difficulty controlling blood pressure as well as recurrent UTIs. Denies being referred to any specialty such as cardiology or urology  No further concerns at this time   Consult visit 02/21/2020 Chelsea [name]: Chelsea [name] is a 46 Caucasian lady seen today for initial office consultation visit for stroke.  She is accompanied by her daughter Chelsea [name].  History is obtained from the patient, review of electronic medical records and I personally reviewed pertinent imaging films and PACS.  She has past medical history of diabetes, hypertension, cardiac arrest who presented to [facility] emergency room on 11/07/2019 with confusion and she was a poor historian upon arrival.  Her blood pressure was found to be significantly elevated to 1 6/79.  She fell 1 week prior that she had lost vision in the left eye.  She had an MRI scan done after arrival which showed a right PCA infarct most involving the right hippocampus.  CT angiogram showed 80% focal left proximal internal carotid artery stenosis.  Left vertebral artery was occluded at the origin.  There was diffuse intracranial atherosclerotic changes involving both posterior cerebral arteries.  She was seen by vascular surgery and underwent elective left carotid endarterectomy in 12/03/2019 by Dr. Arbie [name].  She was placed on aspirin Plavix due to intracranial atherosclerosis.  Her LDL cholesterol is 121 she started on Lipitor 80.  Hemoglobin A1c was significantly elevated at 10.1.  Patient states that she continues to have persistent left-sided vision difficulties however is she has been quite sick with recurrent bouts of urinary tract infections  as well as severe nausea as well as alternating constipation and diarrhea.  She has been on antibiotics several courses.  She is quite frustrated with the primary care physician and has recently changed to a new primary care physician whom she  has not seen yet.  She is barely able to walk with a walker and requires constant help from her daughter and she is now moved in to live with the daughter.  She still has poor balance and poor vision weakness is generalized.  She remains on aspirin and Plavix and denies significant bruising or bleeding.  She is also complaining of increased resurgence of her shingles pain in the left arm.  She had good response with Lyrica in the past but she had stopped it for few months and when she took 1 tablet of 100 mg she could not tolerated and hence she is not taking it since.  Update 05/14/2021: Patient is seen today upon request from her daughters were concerned about her worsening dizziness.  She has had some dizziness and gait difficulties ever since her previous strokes but for the last 3 months were concerned that patient has taken a backward step.  She is barely able to walk even with a walker now.  Patient states that every time she gets up she feels lightheaded dizzy off balance.  She denies true vertigo.  She denies any blurred vision, near syncopal symptoms or increased weakness and numbness at that time.  The daughters feel that patient was started on Benicar around 3 months ago and this may be contributing.  Patient is to be previously quite independent but now she is requiring a lot of help from the daughter's for her mobility.  She did undergo an MRI scan of the brain on 05/08/2021 which did not reveal any acute abnormality and showed her previous old strokes.  Patient and family are wondering if she would benefit with another round of physical therapy.  Her blood pressure is usually been well controlled today it is 139/64.  She has not had any recurrent stroke or TIA symptoms.  She remains on Plavix which is tolerating well without significant bleeding and only minor bruising.  She is tolerating Crestor well without muscle aches and pains.  She is unable to tell me when her last lipid profile was  checked. Update 11/26/2021 : She returns for follow-up after last visit 6 months ago.  She is accompanied by her daughter.  Patient states she is doing better and episode of dizziness and worsening gait and balance appears to have resolved.  She is not sure what happened and primary care did try changing her blood pressure medicines did not have an immediate effect.  She remains on Plavix which is tolerating well with minor bruising but no bleeding.  She states her blood pressures under good control and today it is 125/63.  Mains on Crestor.  Tolerating well without side effects but cannot tell me when last time lipid profile was checked.  He is able to walk with a cane and a walker and had no falls at home.  He has not had any recurrent stroke or TIA symptoms.  Left-sided peripheral vision loss and right leg numbness history of stroke related deficits persist.    14 system review of systems is positive for those listed in HPI and all other systems negative  PMH:  Past Medical History:  Diagnosis Date   Allergy    Anemia    Blind left eye  Cardiac arrest (HCC)    Carotid artery occlusion    Cataract    Depression    Diabetes mellitus without complication (HCC)    Hyperlipidemia    Hypertension    Oxygen deficiency    Stroke [facility])     Social History:  Social History   Socioeconomic History   Marital status: Divorced    Spouse name: Not on file   Number of children: Not on file   Years of education: Not on file   Highest education level: Not on file  Occupational History   Not on file  Tobacco Use   Smoking status: Former    Types: Cigarettes   Smokeless tobacco: Never  Vaping Use   Vaping Use: Never used  Substance and Sexual Activity   Alcohol use: Not Currently   Drug use: Never   Sexual activity: Not Currently  Other Topics Concern   Not on file  Social History Narrative   Lives with daughter, Chelsea [name]   Right Handed   Drinks no caffeine   Social Determinants of  Health   Financial Resource Strain: Not on file  Food Insecurity: Not on file  Transportation Needs: Not on file  Physical Activity: Not on file  Stress: Not on file  Social Connections: Not on file  Intimate Partner Violence: Not on file    Medications:   Current Outpatient Medications on File Prior to Visit  Medication Sig Dispense Refill   acetaminophen (TYLENOL) 500 MG tablet Take 1,000 mg by mouth every 6 (six) hours as needed for moderate pain.     carvedilol (COREG) 3.125 MG tablet TAKE ONE TABLET TWICE DAILY WITH MEAL(S) 180 tablet 3   clopidogrel (PLAVIX) 75 MG tablet TAKE 1 TABLET ONCE DAILY 90 tablet 3   Elastic Bandages & Supports (MEDICAL COMPRESSION SOCKS) MISC 1 Units by Does not apply route daily. 1 each 0   Ferrous Sulfate (IRON) 325 (65 Fe) MG TABS Take 325 mg by mouth every other day.     guaiFENesin (MUCINEX) 600 MG 12 hr tablet Take 1 tablet (600 mg total) by mouth 2 (two) times daily. (Patient taking differently: Take 600 mg by mouth 2 (two) times daily as needed.) 14 tablet 0   KERENDIA 10 MG TABS Take 1 tablet by mouth daily.     olmesartan (BENICAR) 20 MG tablet Take 10 mg by mouth daily.     rosuvastatin (CRESTOR) 5 MG tablet TAKE ONE TABLET EVERY OTHER DAY FOR FOURTEEN DAYS, THEN ONE DAILY 90 tablet 3   tamsulosin (FLOMAX) 0.4 MG CAPS capsule Take 0.4 mg by mouth every evening.     torsemide (DEMADEX) 20 MG tablet Take 20 mg by mouth daily. Takes 40 mg if swelling     No current facility-administered medications on file prior to visit.    Allergies:   Allergies  Allergen Reactions   Codeine Anaphylaxis and Swelling    Throat swells     Latex Anaphylaxis   Penicillins Anaphylaxis and Swelling    Throat closes     Sulfa Antibiotics Anaphylaxis, Other (See Comments) and Swelling    Lips and eye swell   Benzodiazepines Hives   Lidocaine Hives   Oxycodone-Acetaminophen Palpitations         Trazodone Other (See Comments)    Sweating      Cephalexin Itching    Tolerating rocephin 06/25/20   Gabapentin Nausea And Vomiting   Lipitor [Atorvastatin]     Vomiting    Lyrica [Pregabalin]  Vomiting     Physical Exam Today's Vitals   11/26/21 1352  BP: 125/63  Pulse: 66  Height: 5\' 5"  (1.651 m)    Body mass index is 24.63 kg/m.   General: Elderly pleasant Caucasian lady not in distress Head: head normocephalic and atraumatic.   Neck: supple with no carotid or supraclavicular bruits Cardiovascular: regular rate and rhythm, bradycardic, no murmurs Musculoskeletal: no deformity Skin:  no rash/petichiae Vascular:  Normal pulses all extremities  Neurologic Exam Mental Status: Awake and fully alert.  Fluent speech and language.  Oriented to place and time. Recent and remote memory intact. Attention span, concentration and fund of knowledge appropriate. Mood and affect appropriate.  Cranial Nerves: Pupils equal, briskly reactive to light. Extraocular movements full without nystagmus. Visual fields full show dense left homonymous hemianopsia to confrontation. Hearing intact.  Mild left lower facial asymmetry when she smiles.  Sensation intact. tongue, palate moves normally and symmetrically.  Motor: Normal bulk and tone. Full strength left side. RUE: 4+5/5. RLE: mild hip flexor and ADF weakness Sensory:  Slightly decreased light touch sensation right lower extremity otherwise intact Coordination: Rapid alternating movements normal in all extremities except mild Finger-to-nose and heel-to-shin mild incoordination on right side. Gait and Station: Deferred as patient is in a wheelchair today  reflexes: 1+ and symmetric. Toes downgoing.        ASSESSMENT: 66 year oldv  Caucasian lady with a right posterior cerebral artery infarct in October 2021 secondary to intracranial atherosclerosis.  She has longstanding dizziness and gait difficulties which appear to have worsened in the last 3 months and appear to be multifactorial..   Vascular risk factors of hypertension, hyperlipidemia, diabetes, L CEA 11/2019 and intracranial and extracranial atherosclerosis.  Patient had episode of dizziness earlier this year of unclear etiology which appears to have resolved.     PLAN:  I had a long d/w patient and her daughter about her remote t stroke, dizziness and gait imbalance, risk for recurrent stroke/TIAs, personally independently reviewed imaging studies and stroke evaluation results and answered questions.Continue Plavix 75 mg daily for secondary stroke prevention and maintain strict control of hypertension with blood pressure goal below 130/90, diabetes with hemoglobin A1c goal below 6.5% and lipids with LDL cholesterol goal below 70 mg/dL. I also advised the patient to eat a healthy diet with plenty of whole grains, cereals, fruits and vegetables, exercise regularly and maintain ideal body weight .she was encouraged to use her walker at all times and we discussed fall and safety precautions.  Check screening carotid ultrasound and lipid profile and hemoglobin A1c.  Followup in the future with me in the future as needed only. Greater than 50% time during this 35-minute visit was spent in counseling and coordination of care about her dizziness and previous strokes and answering questions and discussion with patient and daughters.  12/2019, MD  [facility] [address] [place], [place]  Phone [phone] Fax [phone] Note: This document was prepared with digital dictation and possible smart phrase technology. Any transcriptional errors that result from this process are unintentional.

## 2021-11-27 LAB — LIPID PANEL
Chol/HDL Ratio: 3.1 ratio (ref 0.0–4.4)
Cholesterol, Total: 129 mg/dL (ref 100–199)
HDL: 41 mg/dL (ref >39)
LDL Chol Calc (NIH): 55 mg/dL (ref 0–99)
Triglycerides: 203 mg/dL — ABNORMAL HIGH (ref 0–149)
VLDL Cholesterol Cal: 33 mg/dL (ref 5–40)

## 2021-11-27 NOTE — Progress Notes (Signed)
Kindly inform the patient that most of the cholesterol profile is satisfactory except triglycerides are elevated and she needs to see primary care physician to discuss treatment for this.

## 2021-12-09 ENCOUNTER — Ambulatory Visit (HOSPITAL_COMMUNITY)
Admission: RE | Admit: 2021-12-09 | Discharge: 2021-12-09 | Disposition: A | Discharge status: Home / Self Care | Payer: PPO | Source: Ambulatory Visit | Attending: Neurology | Admitting: Neurology

## 2021-12-09 DIAGNOSIS — Z8673 Personal history of transient ischemic attack (TIA), and cerebral infarction without residual deficits: Secondary | ICD-10-CM | POA: Diagnosis present

## 2021-12-09 NOTE — Progress Notes (Signed)
Carotid duplex has been completed.   Preliminary results in CV Proc.   Matthieu Loftus Anaise Sterbenz 12/09/2021 2:25 PM

## 2021-12-10 NOTE — Progress Notes (Signed)
Kindly inform the patient that carotid ultrasound study shows no significant blockages of either carotid artery in the neck

## 2022-01-01 ENCOUNTER — Other Ambulatory Visit (HOSPITAL_BASED_OUTPATIENT_CLINIC_OR_DEPARTMENT_OTHER): Payer: Self-pay | Admitting: Cardiology

## 2022-01-01 NOTE — Telephone Encounter (Signed)
Rx request sent to pharmacy.  

## 2022-04-21 ENCOUNTER — Other Ambulatory Visit: Payer: Self-pay

## 2022-04-21 DIAGNOSIS — I6522 Occlusion and stenosis of left carotid artery: Secondary | ICD-10-CM

## 2022-04-27 ENCOUNTER — Ambulatory Visit (INDEPENDENT_AMBULATORY_CARE_PROVIDER_SITE_OTHER): Payer: PPO | Admitting: Cardiology

## 2022-04-27 ENCOUNTER — Encounter (HOSPITAL_BASED_OUTPATIENT_CLINIC_OR_DEPARTMENT_OTHER): Payer: Self-pay | Admitting: Cardiology

## 2022-04-27 VITALS — BP 138/64 | HR 65 | Ht 65.0 in | Wt 154.2 lb

## 2022-04-27 DIAGNOSIS — N1832 Chronic kidney disease, stage 3b: Secondary | ICD-10-CM | POA: Diagnosis not present

## 2022-04-27 DIAGNOSIS — I10 Essential (primary) hypertension: Secondary | ICD-10-CM

## 2022-04-27 DIAGNOSIS — Z8673 Personal history of transient ischemic attack (TIA), and cerebral infarction without residual deficits: Secondary | ICD-10-CM

## 2022-04-27 DIAGNOSIS — E118 Type 2 diabetes mellitus with unspecified complications: Secondary | ICD-10-CM

## 2022-04-27 DIAGNOSIS — E7800 Pure hypercholesterolemia, unspecified: Secondary | ICD-10-CM | POA: Diagnosis not present

## 2022-04-27 NOTE — Progress Notes (Signed)
Cardiology Office Note:    Date:  04/27/2022   ID:  Arelly, Richman [birth year], MRN [id number]  PCP:  Trisha Mangle, FNP  Cardiologist:  Jodelle Red, MD  Referring MD: Trisha Mangle, *   CC: follow up  History of Present Illness:    Chelsea [name] is a 77 y.o. female with a hx of CVA, cardiac arrest, carotid artery occlusion, diabetes mellitus without complication, hypertension, and stroke who is seen for follow up today. I initially met her 06/18/20 as a new consult at the request of Trisha Mangle, * for the evaluation and management of uncontrolled hypertension.  At the last visit in 05/25/2021 note from Dr. Valentino Nose at [facility] from 03/10/21 reviewed. At that visit, olmesartan increased to 20 mg daily, amlodipine decreased to 5 mg daily, torsemide decreased to 40 mg daily due to LE edema. BP well controlled.  Last visit her lightheadedness had resolved. She had been able to do more activities independently since starting physical therapy. Her daughter states that she has been having an appetite. She no longer has a cough.   Today, the patient presents with her daughter to help provide history. She states that she has been doing well but has been short of breath due to having to breathe out of her mouth due to her cold. Her blood pressure and swelling has been well controlled recently. Her lightheadedness is still resolved.   She denies any palpitations, chest pain, or peripheral edema. No lightheadedness, headaches, syncope, orthopnea, or PND.  Past Medical History:  Diagnosis Date   Allergy    Anemia    Blind left eye    Cardiac arrest [facility])    Carotid artery occlusion    Cataract    Depression    Diabetes mellitus without complication (HCC)    Hyperlipidemia    Hypertension    Oxygen deficiency    Stroke [facility])     Past Surgical History:  Procedure Laterality Date   ENDARTERECTOMY Left 12/03/2019   Procedure: LEFT CAROTID  ENDARTERECTOMY;  Surgeon: Chuck Hint, MD;  Location: [facility] OR;  Service: Vascular;  Laterality: Left;   PATCH ANGIOPLASTY Left 12/03/2019   Procedure: PATCH ANGIOPLASTY Left Carotid Artery;  Surgeon: Chuck Hint, MD;  Location: [facility] OR;  Service: Vascular;  Laterality: Left;    Current Medications: Current Outpatient Medications on File Prior to Visit  Medication Sig   acetaminophen (TYLENOL) 500 MG tablet Take 1,000 mg by mouth every 6 (six) hours as needed for moderate pain.   carvedilol (COREG) 3.125 MG tablet TAKE ONE TABLET TWICE DAILY WITH MEAL(S)   clopidogrel (PLAVIX) 75 MG tablet TAKE 1 TABLET ONCE DAILY   Elastic Bandages & Supports (MEDICAL COMPRESSION SOCKS) MISC 1 Units by Does not apply route daily.   Ferrous Sulfate (IRON) 325 (65 Fe) MG TABS Take 325 mg by mouth every other day.   guaiFENesin (MUCINEX) 600 MG 12 hr tablet Take 1 tablet (600 mg total) by mouth 2 (two) times daily. (Patient taking differently: Take 600 mg by mouth 2 (two) times daily as needed.)   KERENDIA 10 MG TABS Take 1 tablet by mouth daily.   olmesartan (BENICAR) 20 MG tablet Take 10 mg by mouth daily.   rosuvastatin (CRESTOR) 5 MG tablet TAKE ONE TABLET EVERY OTHER DAY FOR FOURTEEN DAYS, THEN ONE DAILY   tamsulosin (FLOMAX) 0.4 MG CAPS capsule Take 0.4 mg by mouth every evening.   torsemide (DEMADEX) 20 MG tablet Take  20 mg by mouth daily. Takes 40 mg if swelling   No current facility-administered medications on file prior to visit.     Allergies:   Codeine, Latex, Penicillins, Sulfa antibiotics, Benzodiazepines, Lidocaine, Oxycodone-acetaminophen, Trazodone, Cephalexin, Gabapentin, Lipitor [atorvastatin], and Lyrica [pregabalin]   Social History   Tobacco Use   Smoking status: Former    Types: Cigarettes   Smokeless tobacco: Never  Vaping Use   Vaping Use: Never used  Substance Use Topics   Alcohol use: Not Currently   Drug use: Never    Family History: family history  includes Arthritis in her brother, daughter, father, mother, and sister; COPD in her sister; Cancer in her mother; Depression in her brother, daughter, sister, and son; Diabetes in her brother, daughter, father, mother, sister, and son; Heart attack in her mother and sister; Heart disease in her brother and sister; Hyperlipidemia in her father and mother; Hypertension in her brother, daughter, father, mother, and sister; Stroke in her brother, daughter, and father.  ROS:   Please see the history of present illness. (+)SOB due to cold All other systems are reviewed and negative.    EKGs/Labs/Other Studies Reviewed:    The following studies were reviewed today: Bilateral Carotid Duplex 10/30/2020: Summary:  Right Carotid: Velocities in the right ICA are consistent with a 1-39%  stenosis.   Left Carotid: There is no evidence of stenosis in the left ICA.   Vertebrals:  Right vertebral artery demonstrates antegrade flow. Left  vertebral artery was not visualized.   Subclavians: Normal flow hemodynamics were seen in bilateral subclavian arteries.   Echo 10/07/20 1. Peak LV outflow tract velocity 2.7 m/s, peak gradient 29 mmHg (no  significant obstruction). Left ventricular ejection fraction, by  estimation, is 70 to 75%. The left ventricle has hyperdynamic function.  The left ventricle has no regional wall motion  abnormalities. There is mild left ventricular hypertrophy. Left  ventricular diastolic parameters are consistent with Grade I diastolic  dysfunction (impaired relaxation).   2. Right ventricular systolic function is normal. The right ventricular  size is normal.   3. The mitral valve is normal in structure. No evidence of mitral valve  regurgitation. No evidence of mitral stenosis. Moderate mitral annular  calcification.   4. The aortic valve is tricuspid. Aortic valve regurgitation is not  visualized. Mild aortic valve sclerosis is present, with no evidence of  aortic valve  stenosis.   5. The inferior vena cava is normal in size with greater than 50%  respiratory variability, suggesting right atrial pressure of 3 mmHg.   [place] LE Venous 12/12/2019: RIGHT:  - There is no evidence of deep vein thrombosis in the lower extremity.  - No cystic structure found in the popliteal fossa.     LEFT:  - No evidence of common femoral vein obstruction.  Echo 11/07/2019:  1. Left ventricular ejection fraction, by estimation, is 60 to 65%. The  left ventricle has normal function. The left ventricle has no regional  wall motion abnormalities. There is mild asymmetric left ventricular  hypertrophy of the basal-septal segment.  Left ventricular diastolic parameters are consistent with Grade I  diastolic dysfunction (impaired relaxation). Elevated left ventricular  end-diastolic pressure.   2. Right ventricular systolic function is normal. The right ventricular  size is normal.   3. Left atrial size was moderately dilated.   4. The mitral valve is normal in structure. Trivial mitral valve  regurgitation. No evidence of mitral stenosis. Moderate mitral annular  calcification.  5. The aortic valve is tricuspid. There is mild calcification of the  aortic valve. There is mild thickening of the aortic valve. Aortic valve  regurgitation is not visualized. Mild aortic valve sclerosis is present,  with no evidence of aortic valve  stenosis.   6. The inferior vena cava is normal in size with greater than 50%  respiratory variability, suggesting right atrial pressure of 3 mmHg.   7. Agitated saline contrast bubble study was negative, with no evidence  of any interatrial shunt.   Comparison(s): No prior Echocardiogram.   Conclusion(s)/Recommendation(s): Normal biventricular function without  evidence of hemodynamically significant valvular heart disease. No  intracardiac source of embolism detected on this transthoracic study. A  transesophageal echocardiogram is  recommended to  exclude cardiac source of embolism if clinically indicated.   US Carotid Duplex 11/02/2019: Summary:  Right Carotid: Velocities in the right ICA are consistent with a 1-39%  stenosis.   Left Carotid: Velocities in the left ICA are consistent with a 80-99%  stenosis.   Vertebrals:  Bilateral vertebral arteries demonstrate antegrade flow.  Subclavians: Normal flow hemodynamics were seen in bilateral subclavian arteries.   EKG:  EKG was not ordered today  04/27/2022: SR at 65 bpm with LBBB 07/29/2021: sinus bradycardia at 54 bpm, LVH 06/18/2020: sinus bradycardia at 59 bpm, nonspecific t wave pattern  Recent Labs: No results found for requested labs within last 365 days.   Recent Lipid Panel    Component Value Date/Time   CHOL 129 11/26/2021 1423   TRIG 203 (H) 11/26/2021 1423   HDL 41 11/26/2021 1423   CHOLHDL 3.1 11/26/2021 1423   CHOLHDL 4.8 11/08/2019 0406   VLDL 16 11/08/2019 0406   LDLCALC 55 11/26/2021 1423    Physical Exam:    VS:  BP 138/64 (BP Location: Left Arm, Patient Position: Sitting, Cuff Size: Normal)   Pulse 65   Ht 5\' 5"  (1.651 m)   Wt 154 lb 3.2 oz (69.9 kg)   BMI 25.66 kg/m     Wt Readings from Last 3 Encounters:  06/14/22 153 lb (69.4 kg)  04/29/22 153 lb 4.8 oz (69.5 kg)  04/27/22 154 lb 3.2 oz (69.9 kg)   GEN: Well nourished, well developed in no acute distress HEENT: Normal, moist mucous membranes NECK: No JVD CARDIAC: regular rhythm, normal S1 and S2, no rubs or gallops. No murmur. VASCULAR: Radial and DP pulses 2+ bilaterally. No carotid bruits RESPIRATORY:  Clear to auscultation without rales, wheezing or rhonchi  ABDOMEN: Soft, non-tender, non-distended MUSCULOSKELETAL:  Ambulates independently but using wheelchair SKIN: Warm and dry, no edema NEUROLOGIC:  Alert and oriented x 3. No focal neuro deficits noted. PSYCHIATRIC:  Normal affect   ASSESSMENT:    1. History of CVA (cerebrovascular accident)   2. Essential hypertension   3.  Pure hypercholesterolemia   4. Stage 3b chronic kidney disease (HCC)   5. Type 2 diabetes mellitus with complication, without long-term current use of insulin (HCC)     PLAN:    Lightheadedness/dizziness:  -resolved  History of CVA Carotid stenosis, left Hypercholesterolemia -continue clopidogrel -last LDL 55, goal <70 -continue rosuvastatin 5 mg daily  Hypertension, labile -has a history of both very low and very high pressures, need to be cautious with med changes -no longer on amlodipine. Hydralazine stopped previously. -continue olmesartan, carvedilol, torsemide, kerendia  Type II diabetes Chronic kidney disease, stage 3b (GFR 39) Former smoker -no edema on torsemide/kerendia -With frequent UTIs, SGLT2i not ideal. Considered GLP1RA, though  she has had issues with lack of appetite/weight loss, so would not start at this time.  Cardiac risk counseling and prevention recommendations: -recommend heart healthy/Mediterranean diet, with whole grains, fruits, vegetable, fish, lean meats, nuts, and olive oil. Limit salt. -limited mobility, cannot walk for significant distance -recommend avoidance of tobacco products. Avoid excess alcohol.  Plan for follow up: 6 months or sooner as needed  Jodelle Red, MD, PhD, [facility]  [facility]  [place]  [facility] & Vascular at [facility] at [facility] [address] [place], [place] [address] [phone]  Medication Adjustments/Labs and Tests Ordered: Current medicines are reviewed at length with the patient today.  Concerns regarding medicines are outlined above.   Orders Placed This Encounter  Procedures   EKG 12-Lead   No orders of the defined types were placed in this encounter.  Patient Instructions  Medication Instructions:  Your physician recommends that you continue on your current medications as directed. Please refer to the Current Medication list given to you  today.  *If you need a refill on your cardiac medications before your next appointment, please call your pharmacy*  Lab Work: NONE  Testing/Procedures: NONE  Follow-Up: At [facility], you and your health needs are our priority.  As part of our continuing mission to provide you with exceptional heart care, we have created designated Provider Care Teams.  These Care Teams include your primary Cardiologist (physician) and Advanced Practice Providers (APPs -  Physician Assistants and Nurse Practitioners) who all work together to provide you with the care you need, when you need it.  We recommend signing up for the patient portal called "MyChart".  Sign up information is provided on this After Visit Summary.  MyChart is used to connect with patients for Virtual Visits (Telemedicine).  Patients are able to view lab/test results, encounter notes, upcoming appointments, etc.  Non-urgent messages can be sent to your provider as well.   To learn more about what you can do with MyChart, go to [email or web address].    Your next appointment:   6 month(s)  The format for your next appointment:   In Person  Provider:   Jodelle Red, MD       I,Coren O'Brien,acting as a scribe for Jodelle Red, MD.,have documented all relevant documentation on the behalf of Jodelle Red, MD,as directed by  Jodelle Red, MD while in the presence of Jodelle Red, MD.  I, Jodelle Red, MD, have reviewed all documentation for this visit. The documentation on 06/15/22 for the exam, diagnosis, procedures, and orders are all accurate and complete.

## 2022-04-27 NOTE — Patient Instructions (Signed)
Medication Instructions:  Your physician recommends that you continue on your current medications as directed. Please refer to the Current Medication list given to you today.   *If you need a refill on your cardiac medications before your next appointment, please call your pharmacy*  Lab Work: NONE  Testing/Procedures: NONE   Follow-Up: At [facility], you and your health needs are our priority.  As part of our continuing mission to provide you with exceptional heart care, we have created designated Provider Care Teams.  These Care Teams include your primary Cardiologist (physician) and Advanced Practice Providers (APPs -  Physician Assistants and Nurse Practitioners) who all work together to provide you with the care you need, when you need it.  We recommend signing up for the patient portal called "MyChart".  Sign up information is provided on this After Visit Summary.  MyChart is used to connect with patients for Virtual Visits (Telemedicine).  Patients are able to view lab/test results, encounter notes, upcoming appointments, etc.  Non-urgent messages can be sent to your provider as well.   To learn more about what you can do with MyChart, go to [email or web address].    Your next appointment:   6 month(s)  The format for your next appointment:   In Person  Provider:   Bridgette Christopher, MD             

## 2022-04-28 NOTE — Progress Notes (Unsigned)
HISTORY AND PHYSICAL     CC:  follow up. Requesting Provider:  [name], *  HPI: This is a 77 y.o. female here for follow up for carotid artery stenosis.  Pt is s/p left CEA for asymptomatic carotid artery stenosis on 12/03/2019 by [name].    Pt was last seen 10/30/2020 and at that time she was doing well without any neurological sx.    Pt returns today for follow up.    Pt *** any amaurosis fugax, speech difficulties, weakness, numbness, paralysis or clumsiness or facial droop.    ***  The pt is on a statin for cholesterol management.  The pt is not on a daily aspirin.   Other AC:  Plavix The pt is on BB, diuretic, ARB for hypertension.   The pt is not on medication for diabetes Tobacco hx:  former  Pt does *** have family hx of AAA.  Past Medical History:  Diagnosis Date   Allergy    Anemia    Blind left eye    Cardiac arrest    Carotid artery occlusion    Cataract    Depression    Diabetes mellitus without complication    Hyperlipidemia    Hypertension    Oxygen deficiency    Stroke     Past Surgical History:  Procedure Laterality Date   ENDARTERECTOMY Left 12/03/2019   Procedure: LEFT CAROTID ENDARTERECTOMY;  Surgeon: Chelsea Hint, MD;  Location: [facility] OR;  Service: Vascular;  Laterality: Left;   PATCH ANGIOPLASTY Left 12/03/2019   Procedure: PATCH ANGIOPLASTY Left Carotid Artery;  Surgeon: Chelsea Hint, MD;  Location: [facility] OR;  Service: Vascular;  Laterality: Left;    Allergies  Allergen Reactions   Codeine Anaphylaxis and Swelling    Throat swells     Latex Anaphylaxis   Penicillins Anaphylaxis and Swelling    Throat closes     Sulfa Antibiotics Anaphylaxis, Other (See Comments) and Swelling    Lips and eye swell   Benzodiazepines Hives   Lidocaine Hives   Oxycodone-Acetaminophen Palpitations         Trazodone Other (See Comments)    Sweating     Cephalexin Itching    Tolerating rocephin 06/25/20    Gabapentin Nausea And Vomiting   Lipitor [Atorvastatin]     Vomiting    Lyrica [Pregabalin]     Vomiting     Current Outpatient Medications  Medication Sig Dispense Refill   acetaminophen (TYLENOL) 500 MG tablet Take 1,000 mg by mouth every 6 (six) hours as needed for moderate pain.     carvedilol (COREG) 3.125 MG tablet TAKE ONE TABLET TWICE DAILY WITH MEAL(S) 180 tablet 3   clopidogrel (PLAVIX) 75 MG tablet TAKE 1 TABLET ONCE DAILY 90 tablet 3   Elastic Bandages & Supports (MEDICAL COMPRESSION SOCKS) MISC 1 Units by Does not apply route daily. 1 each 0   Ferrous Sulfate (IRON) 325 (65 Fe) MG TABS Take 325 mg by mouth every other day.     guaiFENesin (MUCINEX) 600 MG 12 hr tablet Take 1 tablet (600 mg total) by mouth 2 (two) times daily. (Patient taking differently: Take 600 mg by mouth 2 (two) times daily as needed.) 14 tablet 0   KERENDIA 10 MG TABS Take 1 tablet by mouth daily.     olmesartan (BENICAR) 20 MG tablet Take 10 mg by mouth daily.     rosuvastatin (CRESTOR) 5 MG tablet TAKE ONE TABLET EVERY OTHER DAY FOR FOURTEEN DAYS,  THEN ONE DAILY 90 tablet 3   tamsulosin (FLOMAX) 0.4 MG CAPS capsule Take 0.4 mg by mouth every evening.     torsemide (DEMADEX) 20 MG tablet Take 20 mg by mouth daily. Takes 40 mg if swelling     No current facility-administered medications for this visit.    Family History  Problem Relation Age of Onset   Hypertension Mother    Hyperlipidemia Mother    Diabetes Mother    Cancer Mother    Arthritis Mother    Heart attack Mother    Hypertension Father    Hyperlipidemia Father    Diabetes Father    Arthritis Father    Stroke Father    Hypertension Sister    Heart disease Sister    Heart attack Sister    Diabetes Sister    Depression Sister    COPD Sister    Arthritis Sister    Stroke Brother    Hypertension Brother    Heart disease Brother    Diabetes Brother    Depression Brother    Arthritis Brother    Stroke Daughter    Hypertension  Daughter    Diabetes Daughter    Depression Daughter    Arthritis Daughter    Diabetes Son    Depression Son     Social History   Socioeconomic History   Marital status: Divorced    Spouse name: Not on file   Number of children: Not on file   Years of education: Not on file   Highest education level: Not on file  Occupational History   Not on file  Tobacco Use   Smoking status: Former    Types: Cigarettes   Smokeless tobacco: Never  Vaping Use   Vaping Use: Never used  Substance and Sexual Activity   Alcohol use: Not Currently   Drug use: Never   Sexual activity: Not Currently  Other Topics Concern   Not on file  Social History Narrative   Lives with daughter, [name]   Right Handed   Drinks no caffeine   Social Determinants of Health   Financial Resource Strain: Not on file  Food Insecurity: Not on file  Transportation Needs: Not on file  Physical Activity: Not on file  Stress: Not on file  Social Connections: Not on file  Intimate Partner Violence: Not on file     REVIEW OF SYSTEMS:  ***  denotes positive finding,  denotes negative finding Cardiac  Comments:  Chest pain or chest pressure:    Shortness of breath upon exertion:    Short of breath when lying flat:    Irregular heart rhythm:        Vascular    Pain in calf, thigh, or hip brought on by ambulation:    Pain in feet at night that wakes you up from your sleep:     Blood clot in your veins:    Leg swelling:         Pulmonary    Oxygen at home:    Productive cough:     Wheezing:         Neurologic    Sudden weakness in arms or legs:     Sudden numbness in arms or legs:     Sudden onset of difficulty speaking or slurred speech:    Temporary loss of vision in one eye:     Problems with dizziness:         Gastrointestinal    Blood in  stool:     Vomited blood:         Genitourinary    Burning when urinating:     Blood in urine:        Psychiatric    Major depression:          Hematologic    Bleeding problems:    Problems with blood clotting too easily:        Skin    Rashes or ulcers:        Constitutional    Fever or chills:      PHYSICAL EXAMINATION:  ***  General:  WDWN in NAD; vital signs documented above Gait: Not observed HENT: WNL, normocephalic Pulmonary: normal non-labored breathing Cardiac: {Desc; regular/irreg:14544} HR, {With/Without:20273} carotid bruit*** Abdomen: soft, NT; aortic pulse is *** palpable Skin: {With/Without:20273} rashes Vascular Exam/Pulses:  Right Left  Radial {Exam; arterial pulse strength 0-4:30167} {Exam; arterial pulse strength 0-4:30167}  Popliteal {Exam; arterial pulse strength 0-4:30167} {Exam; arterial pulse strength 0-4:30167}  DP {Exam; arterial pulse strength 0-4:30167} {Exam; arterial pulse strength 0-4:30167}  PT {Exam; arterial pulse strength 0-4:30167} {Exam; arterial pulse strength 0-4:30167}   Extremities: {With/Without:20273} open wounds Musculoskeletal: no muscle wasting or atrophy  Neurologic: A&O X 3; moving all extremities equally; speech is fluent/normal Psychiatric:  The pt has {Desc; normal/abnormal:11317::"Normal"} affect.   Non-Invasive Vascular Imaging:   Carotid Duplex on 04/28/2022 Right:  ***% ICA stenosis Left:  ***% ICA stenosis ***  Previous Carotid duplex on 12/09/2021: Right: 1-39% ICA stenosis Left:   1-39% ICA stenosis    ASSESSMENT/PLAN:: 77 y.o. female here for follow up carotid artery stenosis and s/p  left CEA for asymptomatic carotid artery stenosis on 12/03/2019 by [name]  -duplex today reveals *** -discussed s/s of stroke with pt and ***he understands should ***he develop any of these sx, ***he will go to the nearest ER or call 911. -pt will f/u in *** with carotid duplex -pt will call sooner should ***he have any issues. -continue statin/asa ***   Doreatha Massed, [facility] [phone]  Clinic MD:

## 2022-04-29 ENCOUNTER — Ambulatory Visit (HOSPITAL_COMMUNITY)
Admission: RE | Admit: 2022-04-29 | Discharge: 2022-04-29 | Disposition: A | Discharge status: Home / Self Care | Payer: PPO | Source: Ambulatory Visit | Attending: Vascular Surgery | Admitting: Vascular Surgery

## 2022-04-29 ENCOUNTER — Ambulatory Visit (INDEPENDENT_AMBULATORY_CARE_PROVIDER_SITE_OTHER): Payer: PPO | Admitting: Physician Assistant

## 2022-04-29 VITALS — BP 172/74 | HR 58 | Temp 97.6°F | Resp 18 | Ht 67.0 in | Wt 153.3 lb

## 2022-04-29 DIAGNOSIS — I6529 Occlusion and stenosis of unspecified carotid artery: Secondary | ICD-10-CM | POA: Diagnosis not present

## 2022-04-29 DIAGNOSIS — I6522 Occlusion and stenosis of left carotid artery: Secondary | ICD-10-CM | POA: Insufficient documentation

## 2022-04-29 DIAGNOSIS — I6523 Occlusion and stenosis of bilateral carotid arteries: Secondary | ICD-10-CM | POA: Diagnosis not present

## 2022-06-12 ENCOUNTER — Emergency Department (HOSPITAL_COMMUNITY): Payer: PPO

## 2022-06-12 ENCOUNTER — Encounter (HOSPITAL_COMMUNITY): Payer: Self-pay

## 2022-06-12 ENCOUNTER — Other Ambulatory Visit: Payer: Self-pay

## 2022-06-12 ENCOUNTER — Inpatient Hospital Stay (HOSPITAL_COMMUNITY)
Admission: EM | Admit: 2022-06-12 | Discharge: 2022-06-25 | DRG: 481 | Disposition: A | Discharge status: Skilled Nursing Facility | Payer: PPO | Attending: Internal Medicine | Admitting: Internal Medicine

## 2022-06-12 DIAGNOSIS — Z825 Family history of asthma and other chronic lower respiratory diseases: Secondary | ICD-10-CM

## 2022-06-12 DIAGNOSIS — Z515 Encounter for palliative care: Secondary | ICD-10-CM | POA: Diagnosis not present

## 2022-06-12 DIAGNOSIS — E860 Dehydration: Secondary | ICD-10-CM | POA: Diagnosis present

## 2022-06-12 DIAGNOSIS — Z818 Family history of other mental and behavioral disorders: Secondary | ICD-10-CM

## 2022-06-12 DIAGNOSIS — N184 Chronic kidney disease, stage 4 (severe): Secondary | ICD-10-CM | POA: Diagnosis present

## 2022-06-12 DIAGNOSIS — I9581 Postprocedural hypotension: Secondary | ICD-10-CM | POA: Diagnosis not present

## 2022-06-12 DIAGNOSIS — K567 Ileus, unspecified: Secondary | ICD-10-CM | POA: Diagnosis not present

## 2022-06-12 DIAGNOSIS — I509 Heart failure, unspecified: Secondary | ICD-10-CM | POA: Diagnosis present

## 2022-06-12 DIAGNOSIS — Z880 Allergy status to penicillin: Secondary | ICD-10-CM

## 2022-06-12 DIAGNOSIS — E875 Hyperkalemia: Secondary | ICD-10-CM | POA: Diagnosis present

## 2022-06-12 DIAGNOSIS — Z90710 Acquired absence of both cervix and uterus: Secondary | ICD-10-CM

## 2022-06-12 DIAGNOSIS — K56609 Unspecified intestinal obstruction, unspecified as to partial versus complete obstruction: Secondary | ICD-10-CM

## 2022-06-12 DIAGNOSIS — Z7902 Long term (current) use of antithrombotics/antiplatelets: Secondary | ICD-10-CM

## 2022-06-12 DIAGNOSIS — Z881 Allergy status to other antibiotic agents status: Secondary | ICD-10-CM

## 2022-06-12 DIAGNOSIS — E1122 Type 2 diabetes mellitus with diabetic chronic kidney disease: Secondary | ICD-10-CM | POA: Diagnosis present

## 2022-06-12 DIAGNOSIS — D649 Anemia, unspecified: Secondary | ICD-10-CM | POA: Diagnosis present

## 2022-06-12 DIAGNOSIS — S72001A Fracture of unspecified part of neck of right femur, initial encounter for closed fracture: Secondary | ICD-10-CM | POA: Diagnosis not present

## 2022-06-12 DIAGNOSIS — S72009A Fracture of unspecified part of neck of unspecified femur, initial encounter for closed fracture: Secondary | ICD-10-CM | POA: Diagnosis present

## 2022-06-12 DIAGNOSIS — W010XXA Fall on same level from slipping, tripping and stumbling without subsequent striking against object, initial encounter: Secondary | ICD-10-CM | POA: Diagnosis present

## 2022-06-12 DIAGNOSIS — Z751 Person awaiting admission to adequate facility elsewhere: Secondary | ICD-10-CM

## 2022-06-12 DIAGNOSIS — S72144A Nondisplaced intertrochanteric fracture of right femur, initial encounter for closed fracture: Principal | ICD-10-CM

## 2022-06-12 DIAGNOSIS — E1151 Type 2 diabetes mellitus with diabetic peripheral angiopathy without gangrene: Secondary | ICD-10-CM | POA: Diagnosis present

## 2022-06-12 DIAGNOSIS — D72829 Elevated white blood cell count, unspecified: Secondary | ICD-10-CM | POA: Diagnosis present

## 2022-06-12 DIAGNOSIS — Y9301 Activity, walking, marching and hiking: Secondary | ICD-10-CM | POA: Diagnosis present

## 2022-06-12 DIAGNOSIS — I700 Atherosclerosis of aorta: Secondary | ICD-10-CM | POA: Diagnosis present

## 2022-06-12 DIAGNOSIS — Z91040 Latex allergy status: Secondary | ICD-10-CM

## 2022-06-12 DIAGNOSIS — Z8673 Personal history of transient ischemic attack (TIA), and cerebral infarction without residual deficits: Secondary | ICD-10-CM | POA: Diagnosis not present

## 2022-06-12 DIAGNOSIS — I69351 Hemiplegia and hemiparesis following cerebral infarction affecting right dominant side: Secondary | ICD-10-CM | POA: Diagnosis not present

## 2022-06-12 DIAGNOSIS — Z79899 Other long term (current) drug therapy: Secondary | ICD-10-CM

## 2022-06-12 DIAGNOSIS — I10 Essential (primary) hypertension: Secondary | ICD-10-CM | POA: Diagnosis present

## 2022-06-12 DIAGNOSIS — I447 Left bundle-branch block, unspecified: Secondary | ICD-10-CM | POA: Diagnosis present

## 2022-06-12 DIAGNOSIS — S72001D Fracture of unspecified part of neck of right femur, subsequent encounter for closed fracture with routine healing: Secondary | ICD-10-CM | POA: Diagnosis not present

## 2022-06-12 DIAGNOSIS — F32A Depression, unspecified: Secondary | ICD-10-CM | POA: Diagnosis present

## 2022-06-12 DIAGNOSIS — Z833 Family history of diabetes mellitus: Secondary | ICD-10-CM

## 2022-06-12 DIAGNOSIS — Z66 Do not resuscitate: Secondary | ICD-10-CM | POA: Diagnosis present

## 2022-06-12 DIAGNOSIS — E1165 Type 2 diabetes mellitus with hyperglycemia: Secondary | ICD-10-CM | POA: Diagnosis present

## 2022-06-12 DIAGNOSIS — N1832 Chronic kidney disease, stage 3b: Secondary | ICD-10-CM | POA: Diagnosis present

## 2022-06-12 DIAGNOSIS — J4489 Other specified chronic obstructive pulmonary disease: Secondary | ICD-10-CM | POA: Diagnosis present

## 2022-06-12 DIAGNOSIS — I130 Hypertensive heart and chronic kidney disease with heart failure and stage 1 through stage 4 chronic kidney disease, or unspecified chronic kidney disease: Secondary | ICD-10-CM | POA: Diagnosis present

## 2022-06-12 DIAGNOSIS — Z87891 Personal history of nicotine dependence: Secondary | ICD-10-CM | POA: Diagnosis not present

## 2022-06-12 DIAGNOSIS — Z882 Allergy status to sulfonamides status: Secondary | ICD-10-CM

## 2022-06-12 DIAGNOSIS — Z83438 Family history of other disorder of lipoprotein metabolism and other lipidemia: Secondary | ICD-10-CM

## 2022-06-12 DIAGNOSIS — Z823 Family history of stroke: Secondary | ICD-10-CM

## 2022-06-12 DIAGNOSIS — M25551 Pain in right hip: Secondary | ICD-10-CM | POA: Diagnosis present

## 2022-06-12 DIAGNOSIS — E440 Moderate protein-calorie malnutrition: Secondary | ICD-10-CM | POA: Diagnosis present

## 2022-06-12 DIAGNOSIS — Z6823 Body mass index (BMI) 23.0-23.9, adult: Secondary | ICD-10-CM | POA: Diagnosis not present

## 2022-06-12 DIAGNOSIS — Z8249 Family history of ischemic heart disease and other diseases of the circulatory system: Secondary | ICD-10-CM

## 2022-06-12 DIAGNOSIS — Z888 Allergy status to other drugs, medicaments and biological substances status: Secondary | ICD-10-CM

## 2022-06-12 DIAGNOSIS — Y92009 Unspecified place in unspecified non-institutional (private) residence as the place of occurrence of the external cause: Secondary | ICD-10-CM | POA: Diagnosis not present

## 2022-06-12 DIAGNOSIS — I5031 Acute diastolic (congestive) heart failure: Secondary | ICD-10-CM | POA: Diagnosis not present

## 2022-06-12 DIAGNOSIS — H5462 Unqualified visual loss, left eye, normal vision right eye: Secondary | ICD-10-CM | POA: Diagnosis present

## 2022-06-12 DIAGNOSIS — E7800 Pure hypercholesterolemia, unspecified: Secondary | ICD-10-CM | POA: Diagnosis present

## 2022-06-12 DIAGNOSIS — Z885 Allergy status to narcotic agent status: Secondary | ICD-10-CM

## 2022-06-12 DIAGNOSIS — E8889 Other specified metabolic disorders: Secondary | ICD-10-CM | POA: Diagnosis present

## 2022-06-12 DIAGNOSIS — Z8674 Personal history of sudden cardiac arrest: Secondary | ICD-10-CM

## 2022-06-12 DIAGNOSIS — Z7409 Other reduced mobility: Secondary | ICD-10-CM | POA: Diagnosis present

## 2022-06-12 DIAGNOSIS — S72141A Displaced intertrochanteric fracture of right femur, initial encounter for closed fracture: Principal | ICD-10-CM | POA: Diagnosis present

## 2022-06-12 DIAGNOSIS — Z884 Allergy status to anesthetic agent status: Secondary | ICD-10-CM

## 2022-06-12 DIAGNOSIS — N179 Acute kidney failure, unspecified: Secondary | ICD-10-CM | POA: Diagnosis present

## 2022-06-12 DIAGNOSIS — I739 Peripheral vascular disease, unspecified: Secondary | ICD-10-CM | POA: Insufficient documentation

## 2022-06-12 DIAGNOSIS — W19XXXA Unspecified fall, initial encounter: Secondary | ICD-10-CM | POA: Diagnosis present

## 2022-06-12 DIAGNOSIS — Z7189 Other specified counseling: Secondary | ICD-10-CM | POA: Diagnosis not present

## 2022-06-12 DIAGNOSIS — R062 Wheezing: Secondary | ICD-10-CM | POA: Insufficient documentation

## 2022-06-12 LAB — CBC WITH DIFFERENTIAL/PLATELET
Abs Immature Granulocytes: 0.08 10*3/uL — ABNORMAL HIGH (ref 0.00–0.07)
Basophils Absolute: 0 10*3/uL (ref 0.0–0.1)
Basophils Relative: 0 %
Eosinophils Absolute: 0.1 10*3/uL (ref 0.0–0.5)
Eosinophils Relative: 1 %
HCT: 34.3 % — ABNORMAL LOW (ref 36.0–46.0)
Hemoglobin: 11 g/dL — ABNORMAL LOW (ref 12.0–15.0)
Immature Granulocytes: 1 %
Lymphocytes Relative: 9 %
Lymphs Abs: 1.3 10*3/uL (ref 0.7–4.0)
MCH: 30.7 pg (ref 26.0–34.0)
MCHC: 32.1 g/dL (ref 30.0–36.0)
MCV: 95.8 fL (ref 80.0–100.0)
Monocytes Absolute: 0.7 10*3/uL (ref 0.1–1.0)
Monocytes Relative: 5 %
Neutro Abs: 12.6 10*3/uL — ABNORMAL HIGH (ref 1.7–7.7)
Neutrophils Relative %: 84 %
Platelets: 229 10*3/uL (ref 150–400)
RBC: 3.58 MIL/uL — ABNORMAL LOW (ref 3.87–5.11)
RDW: 12.3 % (ref 11.5–15.5)
WBC: 14.9 10*3/uL — ABNORMAL HIGH (ref 4.0–10.5)
nRBC: 0 % (ref 0.0–0.2)

## 2022-06-12 LAB — BASIC METABOLIC PANEL
Anion gap: 10 (ref 5–15)
BUN: 47 mg/dL — ABNORMAL HIGH (ref 8–23)
CO2: 21 mmol/L — ABNORMAL LOW (ref 22–32)
Calcium: 8.1 mg/dL — ABNORMAL LOW (ref 8.9–10.3)
Chloride: 106 mmol/L (ref 98–111)
Creatinine, Ser: 2.08 mg/dL — ABNORMAL HIGH (ref 0.44–1.00)
GFR, Estimated: 24 mL/min — ABNORMAL LOW (ref >60)
Glucose, Bld: 168 mg/dL — ABNORMAL HIGH (ref 70–99)
Potassium: 5 mmol/L (ref 3.5–5.1)
Sodium: 137 mmol/L (ref 135–145)

## 2022-06-12 LAB — GLUCOSE, CAPILLARY: Glucose-Capillary: 269 mg/dL — ABNORMAL HIGH (ref 70–99)

## 2022-06-12 LAB — TROPONIN I (HIGH SENSITIVITY): Troponin I (High Sensitivity): 11 ng/L (ref <18)

## 2022-06-12 MED ORDER — FENTANYL CITRATE PF 50 MCG/ML IJ SOSY
50.0000 ug | PREFILLED_SYRINGE | Freq: Once | INTRAMUSCULAR | Status: AC
  Administered 2022-06-12: 50 ug via INTRAVENOUS
  Filled 2022-06-12: qty 1

## 2022-06-12 MED ORDER — CARVEDILOL 3.125 MG PO TABS
3.1250 mg | ORAL_TABLET | Freq: Two times a day (BID) | ORAL | Status: DC
  Administered 2022-06-13 – 2022-06-14 (×3): 3.125 mg via ORAL
  Filled 2022-06-12 (×3): qty 1

## 2022-06-12 MED ORDER — FLUTICASONE FUROATE-VILANTEROL 100-25 MCG/ACT IN AEPB
1.0000 | INHALATION_SPRAY | Freq: Every day | RESPIRATORY_TRACT | Status: DC
  Administered 2022-06-13 – 2022-06-18 (×6): 1 via RESPIRATORY_TRACT
  Filled 2022-06-12 (×2): qty 28

## 2022-06-12 MED ORDER — HEPARIN SODIUM (PORCINE) 5000 UNIT/ML IJ SOLN
5000.0000 [IU] | Freq: Two times a day (BID) | INTRAMUSCULAR | Status: AC
  Administered 2022-06-12 – 2022-06-13 (×3): 5000 [IU] via SUBCUTANEOUS
  Filled 2022-06-12 (×3): qty 1

## 2022-06-12 MED ORDER — SODIUM BICARBONATE 650 MG PO TABS
650.0000 mg | ORAL_TABLET | Freq: Every day | ORAL | Status: DC
  Administered 2022-06-12 – 2022-06-20 (×7): 650 mg via ORAL
  Filled 2022-06-12 (×7): qty 1

## 2022-06-12 MED ORDER — ALBUTEROL SULFATE (2.5 MG/3ML) 0.083% IN NEBU: 2.5000 mg | INHALATION_SOLUTION | Freq: Four times a day (QID) | RESPIRATORY_TRACT | Status: DC | PRN

## 2022-06-12 MED ORDER — ONDANSETRON HCL 4 MG/2ML IJ SOLN
4.0000 mg | Freq: Four times a day (QID) | INTRAMUSCULAR | Status: DC | PRN
  Administered 2022-06-12 – 2022-06-14 (×3): 4 mg via INTRAVENOUS
  Filled 2022-06-12 (×3): qty 2

## 2022-06-12 MED ORDER — HYDROMORPHONE HCL 1 MG/ML IJ SOLN
0.5000 mg | INTRAMUSCULAR | Status: DC | PRN
  Administered 2022-06-12 – 2022-06-16 (×11): 0.5 mg via INTRAVENOUS
  Filled 2022-06-12 (×11): qty 0.5

## 2022-06-12 MED ORDER — SENNOSIDES-DOCUSATE SODIUM 8.6-50 MG PO TABS: 1.0000 | ORAL_TABLET | Freq: Every evening | ORAL | Status: DC | PRN

## 2022-06-12 MED ORDER — BISACODYL 5 MG PO TBEC
5.0000 mg | DELAYED_RELEASE_TABLET | Freq: Every day | ORAL | Status: DC | PRN
  Administered 2022-06-16 – 2022-06-17 (×2): 5 mg via ORAL
  Filled 2022-06-12 (×2): qty 1

## 2022-06-12 MED ORDER — IPRATROPIUM-ALBUTEROL 0.5-2.5 (3) MG/3ML IN SOLN
3.0000 mL | Freq: Two times a day (BID) | RESPIRATORY_TRACT | Status: DC
  Administered 2022-06-13 – 2022-06-18 (×11): 3 mL via RESPIRATORY_TRACT
  Filled 2022-06-12 (×16): qty 3

## 2022-06-12 MED ORDER — GUAIFENESIN ER 600 MG PO TB12
600.0000 mg | ORAL_TABLET | Freq: Two times a day (BID) | ORAL | Status: DC
  Administered 2022-06-12 – 2022-06-24 (×20): 600 mg via ORAL
  Filled 2022-06-12 (×23): qty 1

## 2022-06-12 MED ORDER — SODIUM CHLORIDE 0.9 % IV BOLUS
1000.0000 mL | Freq: Once | INTRAVENOUS | Status: AC
  Administered 2022-06-12: 1000 mL via INTRAVENOUS

## 2022-06-12 MED ORDER — ASPIRIN 81 MG PO TBEC
81.0000 mg | DELAYED_RELEASE_TABLET | Freq: Every day | ORAL | Status: DC
  Administered 2022-06-13 – 2022-06-20 (×6): 81 mg via ORAL
  Filled 2022-06-12 (×6): qty 1

## 2022-06-12 MED ORDER — INSULIN ASPART 100 UNIT/ML IJ SOLN
2.0000 [IU] | Freq: Once | INTRAMUSCULAR | Status: AC
  Administered 2022-06-12: 2 [IU] via SUBCUTANEOUS

## 2022-06-12 MED ORDER — ONDANSETRON HCL 4 MG/2ML IJ SOLN
4.0000 mg | Freq: Once | INTRAMUSCULAR | Status: AC
  Administered 2022-06-12: 4 mg via INTRAVENOUS
  Filled 2022-06-12: qty 2

## 2022-06-12 MED ORDER — TAMSULOSIN HCL 0.4 MG PO CAPS
0.4000 mg | ORAL_CAPSULE | Freq: Every evening | ORAL | Status: DC
  Administered 2022-06-13 – 2022-06-24 (×10): 0.4 mg via ORAL
  Filled 2022-06-12 (×10): qty 1

## 2022-06-12 MED ORDER — BUDESONIDE 0.25 MG/2ML IN SUSP
0.2500 mg | Freq: Two times a day (BID) | RESPIRATORY_TRACT | Status: DC
  Administered 2022-06-12 – 2022-06-18 (×11): 0.25 mg via RESPIRATORY_TRACT
  Filled 2022-06-12 (×16): qty 2

## 2022-06-12 MED ORDER — FINERENONE 10 MG PO TABS
1.0000 | ORAL_TABLET | Freq: Every day | ORAL | Status: DC
  Filled 2022-06-12 (×2): qty 1

## 2022-06-12 MED ORDER — ACETAMINOPHEN 500 MG PO TABS
1000.0000 mg | ORAL_TABLET | Freq: Four times a day (QID) | ORAL | Status: DC | PRN
  Administered 2022-06-13 (×2): 1000 mg via ORAL
  Filled 2022-06-12 (×2): qty 2

## 2022-06-12 MED ORDER — ALBUTEROL SULFATE HFA 108 (90 BASE) MCG/ACT IN AERS: 1.0000 | INHALATION_SPRAY | Freq: Four times a day (QID) | RESPIRATORY_TRACT | Status: DC | PRN

## 2022-06-12 MED ORDER — IRBESARTAN 150 MG PO TABS
75.0000 mg | ORAL_TABLET | Freq: Every day | ORAL | Status: DC
  Administered 2022-06-13: 75 mg via ORAL
  Filled 2022-06-12: qty 1

## 2022-06-12 MED ORDER — FLUTICASONE FUROATE-VILANTEROL 100-25 MCG/ACT IN AEPB: 1.0000 | INHALATION_SPRAY | Freq: Every day | RESPIRATORY_TRACT | Status: DC

## 2022-06-12 MED ORDER — INSULIN ASPART 100 UNIT/ML IJ SOLN
0.0000 [IU] | Freq: Three times a day (TID) | INTRAMUSCULAR | Status: DC
  Administered 2022-06-13: 3 [IU] via SUBCUTANEOUS
  Administered 2022-06-13 (×2): 1 [IU] via SUBCUTANEOUS
  Administered 2022-06-14 (×2): 2 [IU] via SUBCUTANEOUS
  Administered 2022-06-15: 3 [IU] via SUBCUTANEOUS
  Administered 2022-06-15: 1 [IU] via SUBCUTANEOUS
  Administered 2022-06-15: 2 [IU] via SUBCUTANEOUS
  Administered 2022-06-16: 1 [IU] via SUBCUTANEOUS
  Administered 2022-06-16: 2 [IU] via SUBCUTANEOUS
  Administered 2022-06-16: 1 [IU] via SUBCUTANEOUS
  Administered 2022-06-17 (×2): 2 [IU] via SUBCUTANEOUS
  Administered 2022-06-17: 1 [IU] via SUBCUTANEOUS
  Administered 2022-06-18: 2 [IU] via SUBCUTANEOUS
  Administered 2022-06-18: 5 [IU] via SUBCUTANEOUS
  Administered 2022-06-18: 2 [IU] via SUBCUTANEOUS
  Administered 2022-06-19: 1 [IU] via SUBCUTANEOUS
  Administered 2022-06-19: 3 [IU] via SUBCUTANEOUS
  Administered 2022-06-20 (×2): 1 [IU] via SUBCUTANEOUS

## 2022-06-12 MED ORDER — IPRATROPIUM-ALBUTEROL 0.5-2.5 (3) MG/3ML IN SOLN
3.0000 mL | Freq: Four times a day (QID) | RESPIRATORY_TRACT | Status: DC
  Administered 2022-06-12: 3 mL via RESPIRATORY_TRACT
  Filled 2022-06-12: qty 3

## 2022-06-12 MED ORDER — ROSUVASTATIN CALCIUM 5 MG PO TABS
5.0000 mg | ORAL_TABLET | Freq: Every day | ORAL | Status: DC
  Administered 2022-06-13 – 2022-06-25 (×11): 5 mg via ORAL
  Filled 2022-06-12 (×11): qty 1

## 2022-06-12 NOTE — ED Notes (Signed)
Recollect sent 

## 2022-06-12 NOTE — ED Notes (Signed)
MD made aware pt had a brief moment where she brady to 39 strip printed

## 2022-06-12 NOTE — Care Plan (Addendum)
Orthopaedic Surgery Plan of Care Note  -history and imaging reviewed with consulting physician (ER) -pt has right intertroch femur fx, DOI today 06/12/22 -PMH includes prior cardiac arrest, CVA with residual right hemiparesis, anticoagulation (Plavix - last dose this AM per ER), amaurosis fugax, DM (A1c 8.0), CKD, HTN, HLD, depression, s/p left CEA for carotid artery stenosis -admit to medicine team for preoperative optimization and clearance -please hold Plavix for upcoming surgery -will confirm timing of operative intervention pending medical clearance and holding anticoagulation -contact OR control desk for nerve block per hip fx protocol -full consult note to follow  Netta Cedars, MD Orthopaedic Surgery [facility]

## 2022-06-12 NOTE — ED Notes (Signed)
ED TO INPATIENT HANDOFF REPORT  ED Nurse Name and Phone #: [phone]  S Name/Age/Gender Chelsea [name] 77 y.o. female Room/Bed: 012C/012C  Code Status   Code Status: DNR  Home/SNF/Other Home Patient oriented to: self, place, time, and situation Is this baseline? Yes   Triage Complete: Triage complete  Chief Complaint Hip fracture (HCC) [S72.009A] Fall [W19.XXXA]  Triage Note Pt BIB [facility] from home after fall to right side with rotation and no palpated pulse with EMS. Hx of previous stroke causing right sided deficits. Patient reported to EMS that she saw spots and fell but remembers the whole episode. EMS administered fent and 4mg  zofran en route. A&Ox4   Allergies Allergies  Allergen Reactions   Codeine Anaphylaxis and Swelling    Throat swells     Latex Anaphylaxis   Penicillins Anaphylaxis and Swelling    Throat closes     Sulfa Antibiotics Anaphylaxis, Other (See Comments) and Swelling    Lips and eye swell   Benzodiazepines Hives   Cephalosporins Other (See Comments)    unknown  Other Reaction(s): Other (See Comments)   Lidocaine Hives   Oxycodone-Acetaminophen Palpitations    Other Reaction(s): Other (See Comments)  unknown   Trazodone Other (See Comments)    Sweating  Other Reaction(s): Other (See Comments)  unknown   Atorvastatin Nausea And Vomiting    Vomiting  Other Reaction(s): GI Intolerance  Vomiting    Cephalexin Itching    Tolerating rocephin 06/25/20  Other Reaction(s): Other (See Comments)  unknown   Gabapentin Nausea And Vomiting    Other Reaction(s): GI Intolerance   Pregabalin Nausea And Vomiting    Vomiting  Other Reaction(s): GI Intolerance  Vomiting     Level of Care/Admitting Diagnosis ED Disposition     ED Disposition  Admit   Condition  --   Comment  Hospital Area: [facility] [[id number]]  Level of Care: Telemetry Medical [104]  May admit patient to [facility] or [facility] if  equivalent level of care is available:: No  Covid Evaluation: Asymptomatic - no recent exposure (last 10 days) testing not required  Diagnosis: Fall [[id number]]  Admitting Physician: Emeline General [[id number]]  Attending Physician: Emeline General [[id number]]  Certification:: I certify this patient will need inpatient services for at least 2 midnights          B Medical/Surgery History Past Medical History:  Diagnosis Date   Allergy    Anemia    Blind left eye    Cardiac arrest [facility])    Carotid artery occlusion    Cataract    Depression    Diabetes mellitus without complication (HCC)    Hyperlipidemia    Hypertension    Oxygen deficiency    Stroke [facility])    Past Surgical History:  Procedure Laterality Date   ENDARTERECTOMY Left 12/03/2019   Procedure: LEFT CAROTID ENDARTERECTOMY;  Surgeon: Chuck Hint, MD;  Location: [facility] OR;  Service: Vascular;  Laterality: Left;   PATCH ANGIOPLASTY Left 12/03/2019   Procedure: PATCH ANGIOPLASTY Left Carotid Artery;  Surgeon: Chuck Hint, MD;  Location: [facility] OR;  Service: Vascular;  Laterality: Left;     A IV Location/Drains/Wounds Patient Lines/Drains/Airways Status     Active Line/Drains/Airways     Name Placement date Placement time Site Days   Peripheral IV 06/12/22 20 G Anterior;Left Forearm 06/12/22  1411  Forearm  less than 1  Intake/Output Last 24 hours No intake or output data in the 24 hours ending 06/12/22 1849  Labs/Imaging Results for orders placed or performed during the hospital encounter of 06/12/22 (from the past 48 hour(s))  CBC with Differential     Status: Abnormal   Collection Time: 06/12/22  2:17 PM  Result Value Ref Range   WBC 14.9 (H) 4.0 - 10.5 K/uL   RBC 3.58 (L) 3.87 - 5.11 MIL/uL   Hemoglobin 11.0 (L) 12.0 - 15.0 g/dL   HCT 16.1 (L) 09.6 - 04.5 %   MCV 95.8 80.0 - 100.0 fL   MCH 30.7 26.0 - 34.0 pg   MCHC 32.1 30.0 - 36.0 g/dL   RDW 40.9 81.1 - 91.4 %   Platelets 229  150 - 400 K/uL   nRBC 0.0 0.0 - 0.2 %   Neutrophils Relative % 84 %   Neutro Abs 12.6 (H) 1.7 - 7.7 K/uL   Lymphocytes Relative 9 %   Lymphs Abs 1.3 0.7 - 4.0 K/uL   Monocytes Relative 5 %   Monocytes Absolute 0.7 0.1 - 1.0 K/uL   Eosinophils Relative 1 %   Eosinophils Absolute 0.1 0.0 - 0.5 K/uL   Basophils Relative 0 %   Basophils Absolute 0.0 0.0 - 0.1 K/uL   Immature Granulocytes 1 %   Abs Immature Granulocytes 0.08 (H) 0.00 - 0.07 K/uL    Comment: Performed at [facility], [address], [place], [place] [address]  Basic metabolic panel     Status: Abnormal   Collection Time: 06/12/22  4:05 PM  Result Value Ref Range   Sodium 137 135 - 145 mmol/L   Potassium 5.0 3.5 - 5.1 mmol/L   Chloride 106 98 - 111 mmol/L   CO2 21 (L) 22 - 32 mmol/L   Glucose, Bld 168 (H) 70 - 99 mg/dL    Comment: Glucose reference range applies only to samples taken after fasting for at least 8 hours.   BUN 47 (H) 8 - 23 mg/dL   Creatinine, Ser 6.21 (H) 0.44 - 1.00 mg/dL   Calcium 8.1 (L) 8.9 - 10.3 mg/dL   GFR, Estimated 24 (L) >60 mL/min    Comment: (NOTE) Calculated using the CKD-EPI Creatinine Equation (2021)    Anion gap 10 5 - 15    Comment: Performed at [facility], [address], [place], [place] [address]   DG Hip Ceex Haci or [place] Pelvis 1 View Right  Result Date: 06/12/2022 CLINICAL DATA:  Trauma EXAM: DG HIP (WITH OR WITHOUT PELVIS) 1V PORT RIGHT COMPARISON:  10/06/2020 FINDINGS: Acute intertrochanteric fracture is seen in proximal right femur. There is no dislocation. There is slight overriding of fracture fragments. IMPRESSION: Recent intertrochanteric fracture is seen in proximal right femur. Electronically Signed   By: Ernie Avena M.D.   On: 06/12/2022 15:06    Pending Labs Unresulted Labs (From admission, onward)    None       Vitals/Pain Today's Vitals   06/12/22 1600 06/12/22 1630 06/12/22 1748 06/12/22 1753  BP: (!) 145/72 (!) 148/67    Pulse: 68  76  77  Resp:    18  Temp:    99.7 F (37.6 C)  TempSrc:    Oral  SpO2: 100% 100%  95%  Weight:      Height:      PainSc:   10-Worst pain ever     Isolation Precautions No active isolations  Medications Medications  acetaminophen (  TYLENOL) tablet 1,000 mg (has no administration in time range)  carvedilol (COREG) tablet 3.125 mg (has no administration in time range)  irbesartan (AVAPRO) tablet 75 mg (has no administration in time range)  rosuvastatin (CRESTOR) tablet 5 mg (has no administration in time range)  Finerenone TABS 10 mg (has no administration in time range)  tamsulosin (FLOMAX) capsule 0.4 mg (has no administration in time range)  guaiFENesin (MUCINEX) 12 hr tablet 600 mg (has no administration in time range)  heparin injection 5,000 Units (has no administration in time range)  HYDROmorphone (DILAUDID) injection 0.5 mg (has no administration in time range)  bisacodyl (DULCOLAX) EC tablet 5 mg (has no administration in time range)  senna-docusate (Senokot-S) tablet 1 tablet (has no administration in time range)  budesonide (PULMICORT) nebulizer solution 0.25 mg (has no administration in time range)  fluticasone furoate-vilanterol (BREO ELLIPTA) 100-25 MCG/ACT 1 puff (has no administration in time range)  ipratropium-albuterol (DUONEB) 0.5-2.5 (3) MG/3ML nebulizer solution 3 mL (has no administration in time range)  albuterol (VENTOLIN HFA) 108 (90 Base) MCG/ACT inhaler 1 puff (has no administration in time range)  aspirin EC tablet 81 mg (has no administration in time range)  sodium chloride 0.9 % bolus 1,000 mL (1,000 mLs Intravenous Bolus 06/12/22 1519)  fentaNYL (SUBLIMAZE) injection 50 mcg (50 mcg Intravenous Given 06/12/22 1517)  ondansetron (ZOFRAN) injection 4 mg (4 mg Intravenous Given 06/12/22 1515)  fentaNYL (SUBLIMAZE) injection 50 mcg (50 mcg Intravenous Given 06/12/22 1748)    Mobility walks with device     Focused Assessments Hip  fracture   R Recommendations: See Admitting Provider Note  Report given to:   Additional Notes: llives with daughter alert oriented had 5 strokes with vertigo and today saw some black spots before she fell.  No o2 at baseline here on 2L 95%

## 2022-06-12 NOTE — Progress Notes (Signed)
Orthopedic Tech Progress Note Patient Details:  Chelsea [name] 1945/05/14 [id number]  Patient ID: Chelsea [name], female   DOB: 10-10-1945, 77 y.o.   MRN: [id number] Patient doesn't meet criteria for ohf. Pt must be under 70 to get ohf. Trinna Post 06/12/2022, 8:21 PM

## 2022-06-12 NOTE — H&P (Signed)
History and Physical    Chelsea [name]:[id number] DOB: [birth year] DOA: 06/12/2022  PCP: Trisha Mangle, FNP (Confirm with patient/family/NH records and if not entered, this has to be entered at [facility] point of entry) Patient coming from: Home  I have personally briefly reviewed patient's old medical records in [facility] Link  Chief Complaint: I fell and broke my hip  HPI: Chelsea [name] is a 77 y.o. female with medical history significant of CVA with residual chronic right-sided paresis 2021, HTN, IIDM, HLD, CKD stage IV, question of COPD and chronic hypoxic respiratory failure on as needed 2 L oxygen, left-sided carotid stenosis status post CEA, presented with fall and right hip fracture.  At baseline patient uses roller walker for ambulation but only can tolerate short distance and at baseline she is not very active and lifestyle has been sedentary-bedridden and she has a orthopedic bed at night and frequently need to raise bed head to avoid shortening of breath at night.  Chronic patient also has a chronic cough and shortness of breath she described that even walking inside her house can trigger significant shortness of breath and had to sit down to catch her breath.  She does have a intermittent productive cough usually with clear phlegm and her PCP diagnosed her with COPD and put her on as needed home oxygen.  But she denies any chest pain or lightheadedness associated with ambulation.  Today, patient went to bathroom and without warning she fell down, she did not remember any feeling of lightheadedness palpitations.  No head injury no LOC.  No history of CAD, no previous stress test record.  ED Course: Borderline bradycardia heart rate 58, blood pressure stable afebrile O2 sat ration 95% on room air.  Hip x-ray showed acute right intertrochanter fracture.  Blood work showed creatinine 2.0 which is about her baseline, K5.0 bicarb 21 glucose 168  Review of Systems: As per HPI otherwise  14 point review of systems negative.    Past Medical History:  Diagnosis Date   Allergy    Anemia    Blind left eye    Cardiac arrest [facility])    Carotid artery occlusion    Cataract    Depression    Diabetes mellitus without complication (HCC)    Hyperlipidemia    Hypertension    Oxygen deficiency    Stroke [facility])     Past Surgical History:  Procedure Laterality Date   ENDARTERECTOMY Left 12/03/2019   Procedure: LEFT CAROTID ENDARTERECTOMY;  Surgeon: Chuck Hint, MD;  Location: [facility] OR;  Service: Vascular;  Laterality: Left;   PATCH ANGIOPLASTY Left 12/03/2019   Procedure: PATCH ANGIOPLASTY Left Carotid Artery;  Surgeon: Chuck Hint, MD;  Location: [facility] OR;  Service: Vascular;  Laterality: Left;     reports that she has quit smoking. Her smoking use included cigarettes. She has never used smokeless tobacco. She reports that she does not currently use alcohol. She reports that she does not use drugs.  Allergies  Allergen Reactions   Codeine Anaphylaxis and Swelling    Throat swells     Latex Anaphylaxis   Penicillins Anaphylaxis and Swelling    Throat closes     Sulfa Antibiotics Anaphylaxis, Other (See Comments) and Swelling    Lips and eye swell   Benzodiazepines Hives   Cephalosporins Other (See Comments)    unknown  Other Reaction(s): Other (See Comments)   Lidocaine Hives   Oxycodone-Acetaminophen Palpitations    Other Reaction(s): Other (See  Comments)  unknown   Trazodone Other (See Comments)    Sweating  Other Reaction(s): Other (See Comments)  unknown   Atorvastatin Nausea And Vomiting    Vomiting  Other Reaction(s): GI Intolerance  Vomiting    Cephalexin Itching    Tolerating rocephin 06/25/20  Other Reaction(s): Other (See Comments)  unknown   Gabapentin Nausea And Vomiting    Other Reaction(s): GI Intolerance   Pregabalin Nausea And Vomiting    Vomiting  Other Reaction(s): GI Intolerance  Vomiting     Family  History  Problem Relation Age of Onset   Hypertension Mother    Hyperlipidemia Mother    Diabetes Mother    Cancer Mother    Arthritis Mother    Heart attack Mother    Hypertension Father    Hyperlipidemia Father    Diabetes Father    Arthritis Father    Stroke Father    Hypertension Sister    Heart disease Sister    Heart attack Sister    Diabetes Sister    Depression Sister    COPD Sister    Arthritis Sister    Stroke Brother    Hypertension Brother    Heart disease Brother    Diabetes Brother    Depression Brother    Arthritis Brother    Stroke Daughter    Hypertension Daughter    Diabetes Daughter    Depression Daughter    Arthritis Daughter    Diabetes Son    Depression Son      Prior to Admission medications   Medication Sig Start Date End Date Taking? Authorizing Provider  carvedilol (COREG) 3.125 MG tablet TAKE ONE TABLET TWICE DAILY WITH MEAL(S) Patient taking differently: Take 3.125 mg by mouth 2 (two) times daily with a meal. 06/25/21  Yes Chilton Si, MD  clopidogrel (PLAVIX) 75 MG tablet TAKE 1 TABLET ONCE DAILY 01/01/22  Yes Jodelle Red, MD  KERENDIA 10 MG TABS Take 1 tablet by mouth daily. 11/16/21  Yes [provider]  olmesartan (BENICAR) 20 MG tablet Take 10 mg by mouth daily.   Yes [provider]  Polyethyl Glycol-Propyl Glycol (SYSTANE FREE OP) Place 1 drop into both eyes daily.   Yes [provider]  rosuvastatin (CRESTOR) 5 MG tablet TAKE ONE TABLET EVERY OTHER DAY FOR FOURTEEN DAYS, THEN ONE DAILY Patient taking differently: Take 5 mg by mouth daily. 06/25/21  Yes Chilton Si, MD  tamsulosin (FLOMAX) 0.4 MG CAPS capsule Take 0.4 mg by mouth every evening. 07/07/20  Yes [provider]  torsemide (DEMADEX) 20 MG tablet Take 20 mg by mouth daily. Takes 40 mg if swelling   Yes [provider]  acetaminophen (TYLENOL) 500 MG tablet Take 1,000 mg by mouth every 6 (six) hours as needed  for moderate pain.    [provider]  Ferrous Sulfate (IRON) 325 (65 Fe) MG TABS Take 325 mg by mouth every other day.    [provider]  guaiFENesin (MUCINEX) 600 MG 12 hr tablet Take 1 tablet (600 mg total) by mouth 2 (two) times daily. Patient taking differently: Take 600 mg by mouth 2 (two) times daily as needed. 10/10/20   Albertine Grates, MD    Physical Exam: Vitals:   06/12/22 1630 06/12/22 1753 06/12/22 1916 06/12/22 1917  BP: (!) 148/67   (!) 143/63  Pulse: 76 77 84 85  Resp:  18 16 14   Temp:  99.7 F (37.6 C)    TempSrc:  Oral  SpO2: 100% 95% 98% 98%  Weight:      Height:        Constitutional: NAD, calm, comfortable Vitals:   06/12/22 1630 06/12/22 1753 06/12/22 1916 06/12/22 1917  BP: (!) 148/67   (!) 143/63  Pulse: 76 77 84 85  Resp:  18 16 14   Temp:  99.7 F (37.6 C)    TempSrc:  Oral    SpO2: 100% 95% 98% 98%  Weight:      Height:       Eyes: PERRL, lids and conjunctivae normal ENMT: Mucous membranes are moist. Posterior pharynx clear of any exudate or lesions.Normal dentition.  Neck: normal, supple, no masses, no thyromegaly Respiratory: clear to auscultation bilaterally, scattered wheezing, no crackles, increasing breathing effort no accessory muscle use.  Cardiovascular: Regular rate and rhythm, no murmurs / rubs / gallops. No extremity edema. 2+ pedal pulses. No carotid bruits.  Abdomen: no tenderness, no masses palpated. No hepatosplenomegaly. Bowel sounds positive.  Musculoskeletal: Right leg shortened and rotated Skin: no rashes, lesions, ulcers. No induration Neurologic: CN 2-12 grossly intact. Sensation intact, DTR normal.  Slightly weakness on right side compared to left.  Psychiatric: Normal judgment and insight. Alert and oriented x 3. Normal mood.     Labs on Admission: I have personally reviewed following labs and imaging studies  CBC: Recent Labs  Lab 06/12/22 1417  WBC 14.9*  NEUTROABS 12.6*  HGB 11.0*  HCT 34.3*   MCV 95.8  PLT 229   Basic Metabolic Panel: Recent Labs  Lab 06/12/22 1605  NA 137  K 5.0  CL 106  CO2 21*  GLUCOSE 168*  BUN 47*  CREATININE 2.08*  CALCIUM 8.1*   GFR: Estimated Creatinine Clearance: 21.7 mL/min (A) (by C-G formula based on SCr of 2.08 mg/dL (H)). Liver Function Tests: No results for input(s): "AST", "ALT", "ALKPHOS", "BILITOT", "PROT", "ALBUMIN" in the last 168 hours. No results for input(s): "LIPASE", "AMYLASE" in the last 168 hours. No results for input(s): "AMMONIA" in the last 168 hours. Coagulation Profile: No results for input(s): "INR", "PROTIME" in the last 168 hours. Cardiac Enzymes: No results for input(s): "CKTOTAL", "CKMB", "CKMBINDEX", "TROPONINI" in the last 168 hours. BNP (last 3 results) No results for input(s): "PROBNP" in the last 8760 hours. HbA1C: No results for input(s): "HGBA1C" in the last 72 hours. CBG: No results for input(s): "GLUCAP" in the last 168 hours. Lipid Profile: No results for input(s): "CHOL", "HDL", "LDLCALC", "TRIG", "CHOLHDL", "LDLDIRECT" in the last 72 hours. Thyroid Function Tests: No results for input(s): "TSH", "T4TOTAL", "FREET4", "T3FREE", "THYROIDAB" in the last 72 hours. Anemia Panel: No results for input(s): "VITAMINB12", "FOLATE", "FERRITIN", "TIBC", "IRON", "RETICCTPCT" in the last 72 hours. Urine analysis:    Component Value Date/Time   COLORURINE STRAW (A) 01/08/2021 1355   APPEARANCEUR CLEAR 01/08/2021 1355   LABSPEC 1.008 01/08/2021 1355   PHURINE 5.0 01/08/2021 1355   GLUCOSEU NEGATIVE 01/08/2021 1355   HGBUR SMALL (A) 01/08/2021 1355   BILIRUBINUR NEGATIVE 01/08/2021 1355   BILIRUBINUR negative 10/06/2020 1210   KETONESUR NEGATIVE 01/08/2021 1355   PROTEINUR NEGATIVE 01/08/2021 1355   UROBILINOGEN negative (A) 10/06/2020 1210   UROBILINOGEN 0.2 09/17/2008 1929   NITRITE NEGATIVE 01/08/2021 1355   LEUKOCYTESUR NEGATIVE 01/08/2021 1355    Radiological Exams on Admission: DG Chest 1  View  Result Date: 06/12/2022 CLINICAL DATA:  COPD. Fall. Right hip fracture. EXAM: CHEST  1 VIEW COMPARISON:  Chest radiograph 02/05/2021 FINDINGS: Chronic cardiomegaly. Unchanged mediastinal contours. Diaphragmatic flattening  was better demonstrated on prior exam not included a lateral view. Bandlike atelectasis or scarring at the left lung base. No acute airspace disease, pleural effusion or pneumothorax. On limited assessment, no acute osseous finding. IMPRESSION: Chronic cardiomegaly. Bandlike atelectasis or scarring at the left lung base. Electronically Signed   By: Narda Rutherford M.D.   On: 06/12/2022 19:14   DG Hip Port Unilat W or Wo Pelvis 1 View Right  Result Date: 06/12/2022 CLINICAL DATA:  Trauma EXAM: DG HIP (WITH OR WITHOUT PELVIS) 1V PORT RIGHT COMPARISON:  10/06/2020 FINDINGS: Acute intertrochanteric fracture is seen in proximal right femur. There is no dislocation. There is slight overriding of fracture fragments. IMPRESSION: Recent intertrochanteric fracture is seen in proximal right femur. Electronically Signed   By: Ernie Avena M.D.   On: 06/12/2022 15:06    EKG: Independently reviewed. LBBB since April 2024  Assessment/Plan Principal Problem:   Hip fracture [facility]) Active Problems:   Stage 3b chronic kidney disease (HCC)   Hip fracture, right, closed, initial encounter [facility])   Fall  (please populate well all problems here in Problem List. (For example, if patient is on BP meds at home and you resume or decide to hold them, it is a problem that needs to be her. Same for CAD, COPD, HLD and so on)  Fall and right intertrochanter femur fracture -Pain control, planned for ORIF on Monday.  As per request by orthopedic surgery will hold off Plavix -Incentive spirometry, chemical DVT prophylaxis -Given the poor exercise tolerance at baseline, it is hard to evaluate patient's METS value, D/W cardiology fellow recommend echocardiogram 1 time troponin.  Cardiology does not  recommend stress test at this point.  Depends on the echocardiogram finding, will stratify patient's perioperation cardiac risk tomorrow.  Exertional dyspnea -No history of CHF.  Appears to have a questionable history of COPD and patient was a smoker but she quit in 2021 after stroke. -For questions CHF, discussed with on-call cardiology fellow Dr. Welton Flakes, went to x-ray which showed euvolemic, and cardiology recommended echocardiogram and one-time troponin.  Noted that EKG showed LBBB which only showed on EKG 2 months ago but not before that.  However denies any chest pain at any point.  Currently patient appears to be euvolemic, will hold off diuresis until echocardiogram is done. -For COPD, will management as below  Fall -Denies any near-syncope or syncope -Telemonitoring on ED record showed borderline bradycardia which this appears to be chronic.  Will continue telemonitoring for 24 hours  COPD -History implies this patient has a poorly treated chronic bronchitis with frequent productive cough and wheezing. -Start patient on inhaled steroid, ICS and LABA -Labs every 6 hours and as needed albuterol  CKD stage IV -Euvolemic, creatinine level stable -Start bicarb p.o. -Continue ARB  History of CVA with residual right-sided weakness -No new weakness -Change Plavix to aspirin, continue statin  HTN -Continue Coreg and ARB  DVT prophylaxis: Heparin subcu Code Status: DNR Family Communication: Daughter at bedside Disposition Plan: Is sick with hip fracture requiring inpatient ORIF, expect more than 2 midnight hospital stay Consults called: Orthopedic surgery, cardiology Admission status: Telemetry admission   Emeline General MD [facility] Pager [phone]  06/12/2022, 7:56 PM

## 2022-06-12 NOTE — ED Provider Notes (Signed)
[place] EMERGENCY DEPARTMENT AT [facility] Provider Note  CSN: [id number] Arrival date & time: 06/12/22 1317  Chief Complaint(s) Hip Pain  HPI Chelsea [name] is a 77 y.o. female with past medical history as below, significant for CVA with residual right-sided deficit, DM, HLD, HTN CKD, depression who presents to the ED with complaint of fall and right hip pain.  Patient accompanied by sister/caretaker.  Patient reports that she was ambulating to the restroom this morning, when she stood up quickly from a seated position face felt lightheaded/dizzy, fell to the ground.  No LOC.  No head injury.  Explains immediate pain to her right hip and unable to get up from the floor.  She has chronic right-sided weakness from previous stroke.  Numbness or tingling to her right lower extremity or left lower extremity.  No chest pain abdominal pain, nausea or vomiting.  Last p.o. intake was around 8 or 9 AM this morning.  Family reports that she was seen by Delbert Harness previously, no ortho surg hx per pt, unsure provider that she saw  Past Medical History Past Medical History:  Diagnosis Date   Allergy    Anemia    Blind left eye    Cardiac arrest [facility])    Carotid artery occlusion    Cataract    Depression    Diabetes mellitus without complication (HCC)    Hyperlipidemia    Hypertension    Oxygen deficiency    Stroke [facility])    Patient Active Problem List   Diagnosis Date Noted   Hip fracture, right, closed, initial encounter (HCC) 06/12/2022   Hip fracture (HCC) 06/12/2022   Hyponatremia 10/07/2020   AKI (acute kidney injury) (HCC) 10/07/2020   Acute kidney injury superimposed on CKD (HCC) 10/07/2020   Dehydration with hyponatremia    Syncope 10/06/2020   History of CVA (cerebrovascular accident) 07/10/2020   Pure hypercholesterolemia 07/10/2020   Type 2 diabetes mellitus with complication, without long-term current use of insulin (HCC) 07/10/2020   Malnutrition of moderate  degree 06/27/2020   Stage 3b chronic kidney disease (HCC) 06/25/2020   CAP (community acquired pneumonia) 06/25/2020   Stenosis of left carotid artery 12/03/2019   Stroke [facility])    Pain    Acute ischemic right posterior cerebral artery (PCA) stroke (HCC) 11/09/2019   Essential hypertension    Controlled type 2 diabetes mellitus with hyperglycemia (HCC)    Postherpetic neuralgia    Uncontrolled type 2 diabetes mellitus with hyperglycemia, without long-term current use of insulin (HCC) 11/07/2019   Hypertensive urgency 11/07/2019   Stenosis of left internal carotid artery 11/07/2019   Intractable nausea and vomiting 11/07/2019   Nicotine dependence, cigarettes, uncomplicated 11/07/2019   HZV (herpes zoster virus) post herpetic neuralgia 11/07/2019   Weakness of right lower extremity 11/07/2019   CVA (cerebral vascular accident) (HCC) 11/07/2019   Home Medication(s) Prior to Admission medications   Medication Sig Start Date End Date Taking? Authorizing Provider  acetaminophen (TYLENOL) 500 MG tablet Take 1,000 mg by mouth every 6 (six) hours as needed for moderate pain.    [provider]  carvedilol (COREG) 3.125 MG tablet TAKE ONE TABLET TWICE DAILY WITH MEAL(S) 06/25/21   Chilton Si, MD  clopidogrel (PLAVIX) 75 MG tablet TAKE 1 TABLET ONCE DAILY 01/01/22   Jodelle Red, MD  Elastic Bandages & Supports (MEDICAL COMPRESSION SOCKS) MISC 1 Units by Does not apply route daily. 09/12/20   Alver Sorrow, NP  Ferrous Sulfate (IRON) 325 (65  Fe) MG TABS Take 325 mg by mouth every other day.    [provider]  guaiFENesin (MUCINEX) 600 MG 12 hr tablet Take 1 tablet (600 mg total) by mouth 2 (two) times daily. Patient taking differently: Take 600 mg by mouth 2 (two) times daily as needed. 10/10/20   Albertine Grates, MD  KERENDIA 10 MG TABS Take 1 tablet by mouth daily. 11/16/21   [provider]  olmesartan (BENICAR) 20 MG tablet Take 10 mg by mouth daily.     [provider]  rosuvastatin (CRESTOR) 5 MG tablet TAKE ONE TABLET EVERY OTHER DAY FOR FOURTEEN DAYS, THEN ONE DAILY 06/25/21   Chilton Si, MD  tamsulosin [facility]) 0.4 MG CAPS capsule Take 0.4 mg by mouth every evening. 07/07/20   [provider]  torsemide (DEMADEX) 20 MG tablet Take 20 mg by mouth daily. Takes 40 mg if swelling    [provider]                                                                                                                                    Past Surgical History Past Surgical History:  Procedure Laterality Date   ENDARTERECTOMY Left 12/03/2019   Procedure: LEFT CAROTID ENDARTERECTOMY;  Surgeon: Chuck Hint, MD;  Location: [facility] OR;  Service: Vascular;  Laterality: Left;   PATCH ANGIOPLASTY Left 12/03/2019   Procedure: PATCH ANGIOPLASTY Left Carotid Artery;  Surgeon: Chuck Hint, MD;  Location: [facility] OR;  Service: Vascular;  Laterality: Left;   Family History Family History  Problem Relation Age of Onset   Hypertension Mother    Hyperlipidemia Mother    Diabetes Mother    Cancer Mother    Arthritis Mother    Heart attack Mother    Hypertension Father    Hyperlipidemia Father    Diabetes Father    Arthritis Father    Stroke Father    Hypertension Sister    Heart disease Sister    Heart attack Sister    Diabetes Sister    Depression Sister    COPD Sister    Arthritis Sister    Stroke Brother    Hypertension Brother    Heart disease Brother    Diabetes Brother    Depression Brother    Arthritis Brother    Stroke Daughter    Hypertension Daughter    Diabetes Daughter    Depression Daughter    Arthritis Daughter    Diabetes Son    Depression Son     Social History Social History   Tobacco Use   Smoking status: Former    Types: Cigarettes   Smokeless tobacco: Never  Vaping Use   Vaping Use: Never used  Substance Use Topics   Alcohol use: Not Currently   Drug use: Never    Allergies Codeine, Latex, Penicillins, Sulfa antibiotics, Benzodiazepines, Cephalosporins, Lidocaine, Oxycodone-acetaminophen, Trazodone, Atorvastatin, Cephalexin, Gabapentin, and Pregabalin  Review  of Systems Review of Systems  Constitutional:  Negative for activity change and fever.  HENT:  Negative for facial swelling and trouble swallowing.   Eyes:  Negative for discharge and redness.  Respiratory:  Negative for cough and shortness of breath.   Cardiovascular:  Negative for chest pain and palpitations.  Gastrointestinal:  Negative for abdominal pain and nausea.  Genitourinary:  Negative for dysuria and flank pain.  Musculoskeletal:  Positive for arthralgias, gait problem and joint swelling. Negative for back pain.  Skin:  Negative for pallor and rash.  Neurological:  Positive for weakness. Negative for syncope and headaches.    Physical Exam Vital Signs  I have reviewed the triage vital signs BP (!) 148/67   Pulse 77   Temp 99.7 F (37.6 C) (Oral)   Resp 18   Ht 5\' 7"  (1.702 m)   Wt 69.4 kg   SpO2 95%   BMI 23.96 kg/m  Physical Exam Vitals and nursing note reviewed.  Constitutional:      General: She is not in acute distress.    Appearance: Normal appearance.  HENT:     Head: Normocephalic and atraumatic. No raccoon eyes, Battle's sign, abrasion, right periorbital erythema or left periorbital erythema.     Jaw: There is normal jaw occlusion. No trismus.     Right Ear: External ear normal.     Left Ear: External ear normal.     Nose: Nose normal.     Mouth/Throat:     Mouth: Mucous membranes are moist.  Eyes:     General: No scleral icterus.       Right eye: No discharge.        Left eye: No discharge.  Cardiovascular:     Rate and Rhythm: Normal rate and regular rhythm.     Pulses: Normal pulses.     Heart sounds: Normal heart sounds.  Pulmonary:     Effort: Pulmonary effort is normal. No respiratory distress.     Breath sounds: Normal breath sounds.   Abdominal:     General: Abdomen is flat.     Tenderness: There is no abdominal tenderness.  Musculoskeletal:        General: Normal range of motion.     Cervical back: Full passive range of motion without pain. No rigidity.     Right lower leg: No edema.     Left lower leg: No edema.       Legs:     Comments: Lower extremities NVI, symmetric DP pulses Refill is brisk to feet bilateral Significant TTP to right proximal femur, limited range of motion secondary to pain  Skin:    General: Skin is warm and dry.     Capillary Refill: Capillary refill takes less than 2 seconds.  Neurological:     Mental Status: She is alert and oriented to person, place, and time.     GCS: GCS eye subscore is 4. GCS verbal subscore is 5. GCS motor subscore is 6.  Psychiatric:        Mood and Affect: Mood normal.        Behavior: Behavior normal.     ED Results and Treatments Labs (all labs ordered are listed, but only abnormal results are displayed) Labs Reviewed  CBC WITH DIFFERENTIAL/PLATELET - Abnormal; Notable for the following components:      Result Value   WBC 14.9 (*)    RBC 3.58 (*)    Hemoglobin 11.0 (*)    HCT 34.3 (*)  Neutro Abs 12.6 (*)    Abs Immature Granulocytes 0.08 (*)    All other components within normal limits  BASIC METABOLIC PANEL - Abnormal; Notable for the following components:   CO2 21 (*)    Glucose, Bld 168 (*)    BUN 47 (*)    Creatinine, Ser 2.08 (*)    Calcium 8.1 (*)    GFR, Estimated 24 (*)    All other components within normal limits  TROPONIN I (HIGH SENSITIVITY)                                                                                                                          Radiology DG Hip Rafael Hernandez W or [place] Pelvis 1 View Right  Result Date: 06/12/2022 CLINICAL DATA:  Trauma EXAM: DG HIP (WITH OR WITHOUT PELVIS) 1V PORT RIGHT COMPARISON:  10/06/2020 FINDINGS: Acute intertrochanteric fracture is seen in proximal right femur. There is no  dislocation. There is slight overriding of fracture fragments. IMPRESSION: Recent intertrochanteric fracture is seen in proximal right femur. Electronically Signed   By: Ernie Avena M.D.   On: 06/12/2022 15:06    Pertinent labs & imaging results that were available during my care of the patient were reviewed by me and considered in my medical decision making (see MDM for details).  Medications Ordered in ED Medications  acetaminophen (TYLENOL) tablet 1,000 mg (has no administration in time range)  carvedilol (COREG) tablet 3.125 mg (has no administration in time range)  irbesartan (AVAPRO) tablet 75 mg (has no administration in time range)  rosuvastatin (CRESTOR) tablet 5 mg (has no administration in time range)  Finerenone TABS 10 mg (has no administration in time range)  tamsulosin (FLOMAX) capsule 0.4 mg (has no administration in time range)  guaiFENesin (MUCINEX) 12 hr tablet 600 mg (has no administration in time range)  heparin injection 5,000 Units (has no administration in time range)  HYDROmorphone (DILAUDID) injection 0.5 mg (has no administration in time range)  bisacodyl (DULCOLAX) EC tablet 5 mg (has no administration in time range)  senna-docusate (Senokot-S) tablet 1 tablet (has no administration in time range)  budesonide (PULMICORT) nebulizer solution 0.25 mg (has no administration in time range)  fluticasone furoate-vilanterol (BREO ELLIPTA) 100-25 MCG/ACT 1 puff (has no administration in time range)  ipratropium-albuterol (DUONEB) 0.5-2.5 (3) MG/3ML nebulizer solution 3 mL (has no administration in time range)  albuterol (VENTOLIN HFA) 108 (90 Base) MCG/ACT inhaler 1 puff (has no administration in time range)  aspirin EC tablet 81 mg (has no administration in time range)  sodium chloride 0.9 % bolus 1,000 mL (1,000 mLs Intravenous Bolus 06/12/22 1519)  fentaNYL (SUBLIMAZE) injection 50 mcg (50 mcg Intravenous Given 06/12/22 1517)  ondansetron (ZOFRAN) injection 4 mg (4  mg Intravenous Given 06/12/22 1515)  fentaNYL (SUBLIMAZE) injection 50 mcg (50 mcg Intravenous Given 06/12/22 1748)  Procedures Procedures  (including critical care time)  Medical Decision Making / ED Course    Medical Decision Making:    Chelsea [name] is a 77 y.o. female with past medical history as below, significant for CVA with residual right-sided deficit, DM, HLD, HTN CKD, depression who presents to the ED with complaint of fall and right hip pain.. The complaint involves an extensive differential diagnosis and also carries with it a high risk of complications and morbidity.  Serious etiology was considered. Ddx includes but is not limited to: sprain , strain, fx, msk, vascular disruption, etc  Complete initial physical exam performed, notably the patient  was pain on exam to right hip, acting at baseline per family, HDS.    Reviewed and confirmed nursing documentation for past medical history, family history, social history.  Vital signs reviewed.    Clinical Course as of 06/12/22 1843  Sat Jun 12, 2022  1529 Concern for proximal femur fracture on wet read, awaiting formal radiology interpretation   [SG]  1529 Formal read > XR right "IMPRESSION: Recent intertrochanteric fracture is seen in proximal right femur." [SG]  1625 Spoke with ortho Dr Odis Hollingshead, recommend hold plavix. Admit to  medical service  [SG]  1748 Creatinine(!): 2.08 Similar to baseline [SG]  1801 Admitted Dr Chipper Herb [SG]    Clinical Course User Index [SG] Sloan Leiter, DO    Patient with right intertrochanteric femur fracture, d/w orthopedics as above Admitted triad     Additional history obtained: -Additional history obtained from family -External records from outside source obtained and reviewed including: Chart review including previous notes, labs, imaging,  consultation notes including primary care documentation, prior labs and imaging, home medications Takes Plavix 75 mg daily   Lab Tests: -I ordered, reviewed, and interpreted labs.   The pertinent results include:   Labs Reviewed  CBC WITH DIFFERENTIAL/PLATELET - Abnormal; Notable for the following components:      Result Value   WBC 14.9 (*)    RBC 3.58 (*)    Hemoglobin 11.0 (*)    HCT 34.3 (*)    Neutro Abs 12.6 (*)    Abs Immature Granulocytes 0.08 (*)    All other components within normal limits  BASIC METABOLIC PANEL - Abnormal; Notable for the following components:   CO2 21 (*)    Glucose, Bld 168 (*)    BUN 47 (*)    Creatinine, Ser 2.08 (*)    Calcium 8.1 (*)    GFR, Estimated 24 (*)    All other components within normal limits  TROPONIN I (HIGH SENSITIVITY)    Notable for labs stable, she has leukocytosis, likely stress response in setting of trauma  EKG   EKG Interpretation  Date/Time:    Ventricular Rate:    PR Interval:    QRS Duration:   QT Interval:    QTC Calculation:   R Axis:     Text Interpretation:           Imaging Studies ordered: I ordered imaging studies including hip xr I independently visualized the following imaging with scope of interpretation limited to determining acute life threatening conditions related to emergency care; findings noted above, significant for intertroch fx I independently visualized and interpreted imaging. I agree with the radiologist interpretation   Medicines ordered and prescription drug management: Meds ordered this encounter  Medications   sodium chloride 0.9 % bolus 1,000 mL   fentaNYL (SUBLIMAZE) injection 50 mcg   ondansetron (ZOFRAN) injection 4  mg   fentaNYL (SUBLIMAZE) injection 50 mcg   acetaminophen (TYLENOL) tablet 1,000 mg   carvedilol (COREG) tablet 3.125 mg   irbesartan (AVAPRO) tablet 75 mg   rosuvastatin (CRESTOR) tablet 5 mg   Finerenone TABS 10 mg   tamsulosin (FLOMAX) capsule 0.4 mg    guaiFENesin (MUCINEX) 12 hr tablet 600 mg   heparin injection 5,000 Units   HYDROmorphone (DILAUDID) injection 0.5 mg   bisacodyl (DULCOLAX) EC tablet 5 mg   senna-docusate (Senokot-S) tablet 1 tablet   budesonide (PULMICORT) nebulizer solution 0.25 mg   fluticasone furoate-vilanterol (BREO ELLIPTA) 100-25 MCG/ACT 1 puff   ipratropium-albuterol (DUONEB) 0.5-2.5 (3) MG/3ML nebulizer solution 3 mL   albuterol (VENTOLIN HFA) 108 (90 Base) MCG/ACT inhaler 1 puff   aspirin EC tablet 81 mg    -I have reviewed the patients home medicines and have made adjustments as needed   Consultations Obtained: I requested consultation with the Dr Odis Hollingshead,  and discussed lab and imaging findings as well as pertinent plan - they recommend: admit medicine, hold plavix   Cardiac Monitoring: The patient was maintained on a cardiac monitor.  I personally viewed and interpreted the cardiac monitored which showed an underlying rhythm of: NSR  Social Determinants of Health:  Diagnosis or treatment significantly limited by social determinants of health: former smoker   Reevaluation: After the interventions noted above, I reevaluated the patient and found that they have improved  Co morbidities that complicate the patient evaluation  Past Medical History:  Diagnosis Date   Allergy    Anemia    Blind left eye    Cardiac arrest (HCC)    Carotid artery occlusion    Cataract    Depression    Diabetes mellitus without complication (HCC)    Hyperlipidemia    Hypertension    Oxygen deficiency    Stroke [facility])       Dispostion: Disposition decision including need for hospitalization was considered, and patient admitted to the hospital.    Final Clinical Impression(s) / ED Diagnoses Final diagnoses:  Closed nondisplaced intertrochanteric fracture of right femur, initial encounter [facility])     This chart was dictated using voice recognition software.  Despite best efforts to proofread,  errors  can occur which can change the documentation meaning.    Sloan Leiter, DO 06/12/22 1843

## 2022-06-12 NOTE — ED Triage Notes (Signed)
Pt BIB [facility] from home after fall to right side with rotation and no palpated pulse with EMS. Hx of previous stroke causing right sided deficits. Patient reported to EMS that she saw spots and fell but remembers the whole episode. EMS administered fent and 4mg  zofran en route. A&Ox4

## 2022-06-13 ENCOUNTER — Other Ambulatory Visit (HOSPITAL_COMMUNITY): Payer: PPO

## 2022-06-13 ENCOUNTER — Inpatient Hospital Stay (HOSPITAL_COMMUNITY): Payer: PPO | Admitting: Anesthesiology

## 2022-06-13 ENCOUNTER — Inpatient Hospital Stay (HOSPITAL_COMMUNITY): Payer: PPO

## 2022-06-13 DIAGNOSIS — I5031 Acute diastolic (congestive) heart failure: Secondary | ICD-10-CM | POA: Diagnosis not present

## 2022-06-13 DIAGNOSIS — I739 Peripheral vascular disease, unspecified: Secondary | ICD-10-CM | POA: Insufficient documentation

## 2022-06-13 DIAGNOSIS — S72001A Fracture of unspecified part of neck of right femur, initial encounter for closed fracture: Secondary | ICD-10-CM | POA: Diagnosis not present

## 2022-06-13 DIAGNOSIS — R062 Wheezing: Secondary | ICD-10-CM | POA: Insufficient documentation

## 2022-06-13 LAB — GLUCOSE, CAPILLARY
Glucose-Capillary: 139 mg/dL — ABNORMAL HIGH (ref 70–99)
Glucose-Capillary: 213 mg/dL — ABNORMAL HIGH (ref 70–99)
Glucose-Capillary: 127 mg/dL — ABNORMAL HIGH (ref 70–99)
Glucose-Capillary: 255 mg/dL — ABNORMAL HIGH (ref 70–99)

## 2022-06-13 LAB — ECHOCARDIOGRAM COMPLETE
Area-P 1/2: 2.07 cm2
Height: 67 in
MV M vel: 2.48 m/s
MV Peak grad: 24.6 mmHg
MV VTI: 1.05 cm2
S' Lateral: 2.1 cm
Weight: 2448 oz

## 2022-06-13 LAB — CBC
HCT: 33.8 % — ABNORMAL LOW (ref 36.0–46.0)
Hemoglobin: 10.6 g/dL — ABNORMAL LOW (ref 12.0–15.0)
MCH: 30.6 pg (ref 26.0–34.0)
MCHC: 31.4 g/dL (ref 30.0–36.0)
MCV: 97.7 fL (ref 80.0–100.0)
Platelets: 225 10*3/uL (ref 150–400)
RBC: 3.46 MIL/uL — ABNORMAL LOW (ref 3.87–5.11)
RDW: 12.5 % (ref 11.5–15.5)
WBC: 10.1 10*3/uL (ref 4.0–10.5)
nRBC: 0 % (ref 0.0–0.2)

## 2022-06-13 MED ORDER — MUPIROCIN 2 % EX OINT
1.0000 | TOPICAL_OINTMENT | Freq: Two times a day (BID) | CUTANEOUS | Status: AC
  Administered 2022-06-14 – 2022-06-18 (×9): 1 via NASAL
  Filled 2022-06-13 (×5): qty 22

## 2022-06-13 MED ORDER — AMISULPRIDE (ANTIEMETIC) 5 MG/2ML IV SOLN
INTRAVENOUS | Status: AC
  Administered 2022-06-13: 5 mg
  Filled 2022-06-13: qty 2

## 2022-06-13 MED ORDER — AMISULPRIDE (ANTIEMETIC) 5 MG/2ML IV SOLN
5.0000 mg | Freq: Once | INTRAVENOUS | Status: DC
  Filled 2022-06-13: qty 2

## 2022-06-13 MED ORDER — FENTANYL CITRATE PF 50 MCG/ML IJ SOSY: 25.0000 ug | PREFILLED_SYRINGE | Freq: Once | INTRAMUSCULAR | Status: DC

## 2022-06-13 MED ORDER — FENTANYL CITRATE (PF) 100 MCG/2ML IJ SOLN
INTRAMUSCULAR | Status: AC
  Administered 2022-06-13: 25 ug
  Filled 2022-06-13: qty 2

## 2022-06-13 MED ORDER — BUPIVACAINE-EPINEPHRINE (PF) 0.5% -1:200000 IJ SOLN
INTRAMUSCULAR | Status: DC | PRN
  Administered 2022-06-13: 25 mL

## 2022-06-13 MED ORDER — BUPIVACAINE HCL (PF) 0.5 % IJ SOLN
INTRAMUSCULAR | Status: AC
  Filled 2022-06-13: qty 30

## 2022-06-13 NOTE — Hospital Course (Addendum)
Chelsea [name] is a 77 y.o. 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  In the ER, radiograph showed RIGHT hip fracture   6/1: Admitted 6/2: Got peripheral nerve block 6/3: Got IM nail by Dr. Carola Frost, creatinine up to 2.8 6/4: Still orthostatic

## 2022-06-13 NOTE — Assessment & Plan Note (Signed)
A1c 8.1%.  Diet controlled. - Continue SS corrections

## 2022-06-13 NOTE — Assessment & Plan Note (Signed)
As evidenced by loss of subcutaneous muscle mass and fat diffusely

## 2022-06-13 NOTE — Progress Notes (Signed)
  Echocardiogram 2D Echocardiogram has been performed.  Chelsea [name] 06/13/2022, 2:27 PM

## 2022-06-13 NOTE — TOC CAGE-AID Note (Signed)
Transition of Care [facility]) - CAGE-AID Screening   Patient Details  Name: Chelsea [name] MRN: [id number] Date of Birth: 19-Aug-1945  Transition of Care [facility]) CM/SW Contact:    Janora Norlander, RN Phone Number: [phone] 06/13/2022, 1:48 PM   Clinical Narrative: Pt denies alcohol or drug use.  Screening complete.   CAGE-AID Screening:    Have You Ever Felt You Ought to Cut Down on Your Drinking or Drug Use?: No Have People Annoyed You By Critizing Your Drinking Or Drug Use?: No Have You Felt Bad Or Guilty About Your Drinking Or Drug Use?: No Have You Ever Had a Drink or Used Drugs First Thing In The Morning to Steady Your Nerves or to Get Rid of a Hangover?: No CAGE-AID Score: 0  Substance Abuse Education Offered: No

## 2022-06-13 NOTE — Assessment & Plan Note (Signed)
S/p INTRAMEDULLARY (IM) NAIL INTERTROCHANTERIC, REPAIR OF RIGHT HIP FRACTURE 6/3 by Dr. Carola Frost - PT ordered - WBAT R leg with walker - Unrestricted ROM - Analgesia per ortho - DVT PPx SCDs and Plavix

## 2022-06-13 NOTE — Anesthesia Preprocedure Evaluation (Signed)
Anesthesia Evaluation  Patient identified by MRN, date of birth, ID band Patient awake    Reviewed: Allergy & Precautions, NPO status , Patient's Chart, lab work & pertinent test results  Airway Mallampati: III  TM Distance: <3 FB Neck ROM: Full    Dental  (+) Teeth Intact, Dental Advisory Given   Pulmonary former smoker   breath sounds clear to auscultation       Cardiovascular hypertension, Pt. on home beta blockers and Pt. on medications + Peripheral Vascular Disease   Rhythm:Regular Rate:Normal  06/13/2022 ECHO:  1. Left ventricular EF 70 to 75%. The LV has hyperdynamic function, no regional wall motion abnormalities. There is mild LVH. Grade II diastolic dysfunction  (pseudonormalization). Elevated left atrial pressure.   2. Right ventricular systolic function was not well visualized. The right ventricular size is not well visualized.   3. Right atrial size was mildly dilated.   4. The mitral valve is degenerative. Trivial MR. Moderate mitral annular calcification.   5. The aortic valve is tricuspid. Aortic valve regurgitation is not visualized. Aortic valve sclerosis/calcification is present, without any evidence of aortic stenosis.     Neuro/Psych  PSYCHIATRIC DISORDERS  Depression    CVA    GI/Hepatic negative GI ROS, Neg liver ROS,,,  Endo/Other  diabetes    Renal/GU Renal disease     Musculoskeletal negative musculoskeletal ROS (+)    Abdominal   Peds  Hematology   Anesthesia Other Findings   Reproductive/Obstetrics                             Anesthesia Physical Anesthesia Plan  ASA: 3  Anesthesia Plan: General   Post-op Pain Management: Tylenol PO (pre-op)*   Induction: Intravenous  PONV Risk Score and Plan: 4 or greater and Ondansetron, Dexamethasone and Treatment may vary due to age or medical condition  Airway Management Planned: Oral ETT  Additional Equipment:  None  Intra-op Plan:   Post-operative Plan: Extubation in OR  Informed Consent: I have reviewed the patients History and Physical, chart, labs and discussed the procedure including the risks, benefits and alternatives for the proposed anesthesia with the patient or authorized representative who has indicated his/her understanding and acceptance.   Patient has DNR.  Discussed DNR with patient and Continue DNR.   Dental advisory given  Plan Discussed with: CRNA  Anesthesia Plan Comments:        Anesthesia Quick Evaluation

## 2022-06-13 NOTE — Anesthesia Preprocedure Evaluation (Addendum)
Anesthesia Evaluation  Patient identified by MRN, date of birth, ID band Patient awake    Reviewed: Allergy & Precautions, NPO status , Patient's Chart, lab work & pertinent test results  History of Anesthesia Complications Negative for: history of anesthetic complications  Airway Mallampati: II  TM Distance: >3 FB Neck ROM: Full    Dental  (+) Dental Advisory Given   Pulmonary former smoker   breath sounds clear to auscultation       Cardiovascular hypertension, Pt. on medications and Pt. on home beta blockers (-) angina + Peripheral Vascular Disease   Rhythm:Regular Rate:Bradycardia  06/13/2022 ECHO:  1. Left ventricular EF 70 to 75%. The LV has hyperdynamic function, no regional wall motion abnormalities. There is mild LVH. Grade II diastolic dysfunction  (pseudonormalization). Elevated left atrial pressure.   2. Right ventricular systolic function was not well visualized. The right ventricular size is not well visualized.   3. Right atrial size was mildly dilated.   4. The mitral valve is degenerative. Trivial MR. Moderate mitral annular calcification.   5. The aortic valve is tricuspid. Aortic valve regurgitation is not visualized. Aortic valve sclerosis/calcification is present, without any evidence of aortic stenosis.      Neuro/Psych    Depression    Walks with walker CVA (balance and R weakness), Residual Symptoms    GI/Hepatic negative GI ROS, Neg liver ROS,,,  Endo/Other  diabetes (glu 127)    Renal/GU Renal InsufficiencyRenal disease     Musculoskeletal Acute intertrochanteric fracture is seen in proximal right femur.   Abdominal   Peds  Hematology  (+) Blood dyscrasia (Hb 10.6, plt 225k), anemia   Anesthesia Other Findings   Reproductive/Obstetrics                             Anesthesia Physical Anesthesia Plan  ASA: 3  Anesthesia Plan: Regional   Post-op Pain Management:     Induction:   PONV Risk Score and Plan: 2 and Treatment may vary due to age or medical condition  Airway Management Planned: Natural Airway and Nasal Cannula  Additional Equipment: None  Intra-op Plan:   Post-operative Plan:   Informed Consent: I have reviewed the patients History and Physical, chart, labs and discussed the procedure including the risks, benefits and alternatives for the proposed anesthesia with the patient or authorized representative who has indicated his/her understanding and acceptance.     Dental advisory given and Consent reviewed with POA  Plan Discussed with: Anesthesiologist  Anesthesia Plan Comments: (Plan PENG block for hip fracture analgesia, discussed with patient and her daughter)        Anesthesia Quick Evaluation

## 2022-06-13 NOTE — Progress Notes (Signed)
  Progress Note   Patient: Chelsea [name] WJX:[id number] DOB: Apr 13, 1945 DOA: 06/12/2022     1 DOS: the patient was seen and examined on 06/13/2022       Brief hospital course: [name] is a 77 y.o. 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  In the ER, radiograph showed RIGHT hip fracture     Assessment and Plan: * Hip fracture, right, closed, initial encounter (HCC) - Consult ortho for repair    Wheezing Patient was prescribed supplemental oxygen after her stroke many years ago but never uses it at home, family confirmed that she does not use oxygen at home as needed, she is not using alcohol.  Here she is not wheezing, has no dyspnea.  She has no swelling, palpitations, orthopnea, change in breathing from baseline.  I confirmed this twice with family, and I am uncertain what H&P was referring to. - Follow echo  PVD (peripheral vascular disease) (HCC) - Hold Plavix - Continue aspirin, Crestor, beta-blocker  Fall    Pure hypercholesterolemia - Continue Crestor  History of CVA (cerebrovascular accident) - Continue aspirin - Hold Plavix - Continue Crestor  Malnutrition of moderate degree As evidenced by loss of subcutaneous muscle mass and fat diffusely  CKD (chronic kidney disease), stage IV (HCC) Creatinine 2.1, stable relative to baseline - Continue finerenone if able to bring in from home - Close monitoring renal function after surgery  Essential hypertension BP controlled - Continue Coreg, ARB - Hold irbesartan tomorrow morning - Hold torsemide  Controlled type 2 diabetes mellitus with hyperglycemia, without long-term current use of insulin (HCC) A1c 8% last month.  Diet controlled. - Continue SS corerctions          Subjective: Patient has some soreness in her right hip, improved  with pain medicine.  No fever, no respiratory distress, no cough, no palpitations, no chest pain.     Physical Exam: BP 124/74   Pulse (!) 58   Temp 97.8 F (36.6 C) (Oral)   Resp 16   Ht 5\' 7"  (1.702 m)   Wt 69.4 kg   SpO2 98%   BMI 23.96 kg/m   Elderly obese female, lying in bed, no acute distress RRR, no murmurs, no peripheral edema Respiratory rate normal, lungs clear without rales or wheezes Abdomen soft without tenderness palpation or guarding Hard of hearing, mild dementia, face symmetric, speech fluent, upper extremity strength appears generally weak but symmetric    Data Reviewed: BMP shows creatinine 2.08 Glucose is slightly elevated Hemoglobin 10.6 Troponin normal Echo pending  Family Communication: Daughters at the bedside    Disposition: Status is: Inpatient ORIF tomorrow, will need close monitoring of respiratory status, electrolytes and renal function postoperatively.  Likely to SNF midweek        Author: Alberteen Sam, MD 06/13/2022 11:30 AM  For on call review [email or web address].

## 2022-06-13 NOTE — Assessment & Plan Note (Addendum)
See prior notes.  Respiratory status stable.  No prior formal diagnosis of COPD.

## 2022-06-13 NOTE — Anesthesia Postprocedure Evaluation (Signed)
Anesthesia Post Note  Patient: Chelsea [name]  Procedure(s) Performed: AN AD HOC NERVE BLOCK     Patient location during evaluation: PACU Anesthesia Type: Regional Level of consciousness: awake and alert, patient cooperative and oriented Pain management: pain level controlled (pt says no pain presently) Vital Signs Assessment: post-procedure vital signs reviewed and stable Respiratory status: nonlabored ventilation, spontaneous breathing, respiratory function stable and patient connected to nasal cannula oxygen Cardiovascular status: blood pressure returned to baseline and stable Postop Assessment: no apparent nausea or vomiting Anesthetic complications: no   No notable events documented.  Last Vitals:  Vitals:   06/13/22 1740 06/13/22 1745  BP: (!) 114/52 (!) 129/50  Pulse: (!) 56 (!) 59  Resp: 12 18  Temp:    SpO2: 99% 99%    Last Pain:  Vitals:   06/13/22 1616  TempSrc:   PainSc: 5                  Eduardo Honor,E. Jazaria Jarecki

## 2022-06-13 NOTE — Assessment & Plan Note (Addendum)
-   Continue aspirin, Plavix - Trend CBC - Continue Crestor

## 2022-06-13 NOTE — Assessment & Plan Note (Addendum)
Cr baseline 2.0-2.2, see above  - Resume finerenone now that BP is better

## 2022-06-13 NOTE — Assessment & Plan Note (Signed)
Continue Crestor 

## 2022-06-13 NOTE — Assessment & Plan Note (Addendum)
BP trending up  - Resume Coreg, finerenone - Hold ARB due to AKI - Hold torsemide

## 2022-06-13 NOTE — Anesthesia Procedure Notes (Signed)
Anesthesia Regional Block: Peng block   Pre-Anesthetic Checklist: , timeout performed,  Correct Patient, Correct Site, Correct Laterality,  Correct Procedure, Correct Position, site marked,  Risks and benefits discussed,  Surgical consent,  Pre-op evaluation,  At surgeon's request and post-op pain management  Laterality: Right and Lower  Prep: chloraprep       Needles:  Injection technique: Single-shot  Needle Type: Echogenic Needle     Needle Length: 9cm  Needle Gauge: 21     Additional Needles:   Procedures:,,,, ultrasound used (permanent image in chart),,    Narrative:  Start time: 06/13/2022 5:21 PM End time: 06/13/2022 5:26 PM Injection made incrementally with aspirations every 5 mL.  Performed by: Personally  Anesthesiologist: Jairo Ben, MD  Additional Notes: Pt identified in Holding room.  Monitors applied. Working IV access confirmed. Timeout, Sterile prep R iliac crest and inguinal crease.  #21ga ECHOgenic Arrow block needle to PENG with US guidance.  25cc 0.5% Bupivacaine 1:200k epi injected incrementally after negative test dose.  Patient asymptomatic, VSS, no heme aspirated, tolerated well. Sandford Craze, MD

## 2022-06-13 NOTE — Care Plan (Signed)
Orthopaedic Surgery Plan of Care Note   -history and imaging reviewed with primary team -Came to bedside to evaluate patient. However, she was off the floor to undergo echo -Reviewed case with Ortho traumatologist team who will kindly help manage possibly tomorrow -please keep NPO and hold VTE ppx from MN -full consult note to follow   Netta Cedars, MD Orthopaedic Surgery [facility]

## 2022-06-13 NOTE — Assessment & Plan Note (Addendum)
-   Continue aspirin, Crestor, Plavix

## 2022-06-14 ENCOUNTER — Encounter (HOSPITAL_COMMUNITY)
Admission: EM | Disposition: A | Discharge status: Skilled Nursing Facility | Payer: Self-pay | Source: Home / Self Care | Attending: Family Medicine

## 2022-06-14 ENCOUNTER — Inpatient Hospital Stay (HOSPITAL_COMMUNITY): Payer: PPO | Admitting: Anesthesiology

## 2022-06-14 ENCOUNTER — Inpatient Hospital Stay (HOSPITAL_COMMUNITY): Payer: PPO

## 2022-06-14 ENCOUNTER — Other Ambulatory Visit: Payer: Self-pay

## 2022-06-14 ENCOUNTER — Encounter (HOSPITAL_COMMUNITY): Payer: Self-pay | Admitting: Internal Medicine

## 2022-06-14 DIAGNOSIS — E1151 Type 2 diabetes mellitus with diabetic peripheral angiopathy without gangrene: Secondary | ICD-10-CM | POA: Diagnosis not present

## 2022-06-14 DIAGNOSIS — N184 Chronic kidney disease, stage 4 (severe): Secondary | ICD-10-CM

## 2022-06-14 DIAGNOSIS — S72141A Displaced intertrochanteric fracture of right femur, initial encounter for closed fracture: Secondary | ICD-10-CM | POA: Diagnosis not present

## 2022-06-14 DIAGNOSIS — Z8673 Personal history of transient ischemic attack (TIA), and cerebral infarction without residual deficits: Secondary | ICD-10-CM

## 2022-06-14 DIAGNOSIS — E440 Moderate protein-calorie malnutrition: Secondary | ICD-10-CM

## 2022-06-14 DIAGNOSIS — Z87891 Personal history of nicotine dependence: Secondary | ICD-10-CM

## 2022-06-14 DIAGNOSIS — E1165 Type 2 diabetes mellitus with hyperglycemia: Secondary | ICD-10-CM | POA: Diagnosis not present

## 2022-06-14 DIAGNOSIS — E7800 Pure hypercholesterolemia, unspecified: Secondary | ICD-10-CM

## 2022-06-14 DIAGNOSIS — S72001A Fracture of unspecified part of neck of right femur, initial encounter for closed fracture: Secondary | ICD-10-CM | POA: Diagnosis not present

## 2022-06-14 DIAGNOSIS — I10 Essential (primary) hypertension: Secondary | ICD-10-CM

## 2022-06-14 DIAGNOSIS — N179 Acute kidney failure, unspecified: Secondary | ICD-10-CM

## 2022-06-14 DIAGNOSIS — E875 Hyperkalemia: Secondary | ICD-10-CM | POA: Insufficient documentation

## 2022-06-14 HISTORY — PX: INTRAMEDULLARY (IM) NAIL INTERTROCHANTERIC: SHX5875

## 2022-06-14 LAB — SURGICAL PCR SCREEN
MRSA, PCR: NEGATIVE
Staphylococcus aureus: POSITIVE — AB

## 2022-06-14 LAB — CBC
HCT: 30.1 % — ABNORMAL LOW (ref 36.0–46.0)
Hemoglobin: 9.8 g/dL — ABNORMAL LOW (ref 12.0–15.0)
MCH: 31.3 pg (ref 26.0–34.0)
MCHC: 32.6 g/dL (ref 30.0–36.0)
MCV: 96.2 fL (ref 80.0–100.0)
Platelets: 190 10*3/uL (ref 150–400)
RBC: 3.13 MIL/uL — ABNORMAL LOW (ref 3.87–5.11)
RDW: 12.6 % (ref 11.5–15.5)
WBC: 8.8 10*3/uL (ref 4.0–10.5)
nRBC: 0 % (ref 0.0–0.2)

## 2022-06-14 LAB — BASIC METABOLIC PANEL
Anion gap: 9 (ref 5–15)
Anion gap: 10 (ref 5–15)
BUN: 58 mg/dL — ABNORMAL HIGH (ref 8–23)
BUN: 52 mg/dL — ABNORMAL HIGH (ref 8–23)
CO2: 25 mmol/L (ref 22–32)
CO2: 23 mmol/L (ref 22–32)
Calcium: 8.1 mg/dL — ABNORMAL LOW (ref 8.9–10.3)
Calcium: 8.5 mg/dL — ABNORMAL LOW (ref 8.9–10.3)
Chloride: 100 mmol/L (ref 98–111)
Chloride: 103 mmol/L (ref 98–111)
Creatinine, Ser: 2.8 mg/dL — ABNORMAL HIGH (ref 0.44–1.00)
Creatinine, Ser: 2.65 mg/dL — ABNORMAL HIGH (ref 0.44–1.00)
GFR, Estimated: 17 mL/min — ABNORMAL LOW (ref >60)
GFR, Estimated: 18 mL/min — ABNORMAL LOW (ref >60)
Glucose, Bld: 212 mg/dL — ABNORMAL HIGH (ref 70–99)
Glucose, Bld: 213 mg/dL — ABNORMAL HIGH (ref 70–99)
Potassium: 5.3 mmol/L — ABNORMAL HIGH (ref 3.5–5.1)
Potassium: 5.4 mmol/L — ABNORMAL HIGH (ref 3.5–5.1)
Sodium: 134 mmol/L — ABNORMAL LOW (ref 135–145)
Sodium: 136 mmol/L (ref 135–145)

## 2022-06-14 LAB — GLUCOSE, CAPILLARY
Glucose-Capillary: 194 mg/dL — ABNORMAL HIGH (ref 70–99)
Glucose-Capillary: 167 mg/dL — ABNORMAL HIGH (ref 70–99)
Glucose-Capillary: 150 mg/dL — ABNORMAL HIGH (ref 70–99)
Glucose-Capillary: 144 mg/dL — ABNORMAL HIGH (ref 70–99)
Glucose-Capillary: 182 mg/dL — ABNORMAL HIGH (ref 70–99)
Glucose-Capillary: 190 mg/dL — ABNORMAL HIGH (ref 70–99)
Glucose-Capillary: 254 mg/dL — ABNORMAL HIGH (ref 70–99)

## 2022-06-14 LAB — HEMOGLOBIN A1C
Hgb A1c MFr Bld: 8.1 % — ABNORMAL HIGH (ref 4.8–5.6)
Mean Plasma Glucose: 186 mg/dL

## 2022-06-14 SURGERY — FIXATION, FRACTURE, INTERTROCHANTERIC, WITH INTRAMEDULLARY ROD
Anesthesia: General | Site: Hip | Laterality: Right

## 2022-06-14 MED ORDER — ONDANSETRON HCL 4 MG PO TABS: 4.0000 mg | ORAL_TABLET | Freq: Four times a day (QID) | ORAL | Status: DC | PRN

## 2022-06-14 MED ORDER — MENTHOL 3 MG MT LOZG: 1.0000 | LOZENGE | OROMUCOSAL | Status: DC | PRN

## 2022-06-14 MED ORDER — GLUCERNA SHAKE PO LIQD
237.0000 mL | Freq: Three times a day (TID) | ORAL | Status: DC
  Administered 2022-06-14 – 2022-06-16 (×4): 237 mL via ORAL

## 2022-06-14 MED ORDER — TRANEXAMIC ACID-NACL 1000-0.7 MG/100ML-% IV SOLN
1000.0000 mg | Freq: Once | INTRAVENOUS | Status: AC
  Administered 2022-06-14: 1000 mg via INTRAVENOUS
  Filled 2022-06-14: qty 100

## 2022-06-14 MED ORDER — PHENOL 1.4 % MT LIQD
1.0000 | OROMUCOSAL | Status: DC | PRN
  Administered 2022-06-19: 1 via OROMUCOSAL
  Filled 2022-06-14: qty 177

## 2022-06-14 MED ORDER — HYDROMORPHONE HCL 1 MG/ML IJ SOLN: 0.2500 mg | INTRAMUSCULAR | Status: DC | PRN

## 2022-06-14 MED ORDER — CLOPIDOGREL BISULFATE 75 MG PO TABS
75.0000 mg | ORAL_TABLET | Freq: Every day | ORAL | Status: DC
  Administered 2022-06-15 – 2022-06-20 (×5): 75 mg via ORAL
  Filled 2022-06-14 (×6): qty 1

## 2022-06-14 MED ORDER — HYDROMORPHONE HCL 1 MG/ML IJ SOLN
INTRAMUSCULAR | Status: AC
  Filled 2022-06-14: qty 1

## 2022-06-14 MED ORDER — ONDANSETRON HCL 4 MG/2ML IJ SOLN
4.0000 mg | Freq: Four times a day (QID) | INTRAMUSCULAR | Status: DC | PRN
  Administered 2022-06-15 – 2022-06-20 (×5): 4 mg via INTRAVENOUS
  Filled 2022-06-14 (×5): qty 2

## 2022-06-14 MED ORDER — ACETAMINOPHEN 325 MG PO TABS
650.0000 mg | ORAL_TABLET | Freq: Four times a day (QID) | ORAL | Status: DC
  Administered 2022-06-14 – 2022-06-19 (×15): 650 mg via ORAL
  Filled 2022-06-14 (×15): qty 2

## 2022-06-14 MED ORDER — PHENYLEPHRINE 80 MCG/ML (10ML) SYRINGE FOR IV PUSH (FOR BLOOD PRESSURE SUPPORT)
PREFILLED_SYRINGE | INTRAVENOUS | Status: DC | PRN
  Administered 2022-06-14: 240 ug via INTRAVENOUS
  Administered 2022-06-14 (×2): 80 ug via INTRAVENOUS

## 2022-06-14 MED ORDER — EPHEDRINE SULFATE-NACL 50-0.9 MG/10ML-% IV SOSY
PREFILLED_SYRINGE | INTRAVENOUS | Status: DC | PRN
  Administered 2022-06-14: 5 mg via INTRAVENOUS

## 2022-06-14 MED ORDER — HYDROCODONE-ACETAMINOPHEN 5-325 MG PO TABS
1.0000 | ORAL_TABLET | Freq: Four times a day (QID) | ORAL | Status: DC | PRN
  Administered 2022-06-14: 1 via ORAL
  Administered 2022-06-15: 2 via ORAL
  Administered 2022-06-16: 1 via ORAL
  Administered 2022-06-16: 2 via ORAL
  Filled 2022-06-14 (×2): qty 1
  Filled 2022-06-14 (×3): qty 2

## 2022-06-14 MED ORDER — ACETAMINOPHEN 160 MG/5ML PO SOLN: 325.0000 mg | Freq: Once | ORAL | Status: DC | PRN

## 2022-06-14 MED ORDER — SODIUM CHLORIDE 0.9 % IV BOLUS
1000.0000 mL | Freq: Once | INTRAVENOUS | Status: AC
  Administered 2022-06-14: 1000 mL via INTRAVENOUS

## 2022-06-14 MED ORDER — PHENYLEPHRINE 80 MCG/ML (10ML) SYRINGE FOR IV PUSH (FOR BLOOD PRESSURE SUPPORT)
PREFILLED_SYRINGE | INTRAVENOUS | Status: AC
  Filled 2022-06-14: qty 10

## 2022-06-14 MED ORDER — ORAL CARE MOUTH RINSE: 15.0000 mL | Freq: Once | OROMUCOSAL | Status: AC

## 2022-06-14 MED ORDER — PHENYLEPHRINE HCL-NACL 20-0.9 MG/250ML-% IV SOLN
INTRAVENOUS | Status: DC | PRN
  Administered 2022-06-14: 40 ug/min via INTRAVENOUS

## 2022-06-14 MED ORDER — 0.9 % SODIUM CHLORIDE (POUR BTL) OPTIME
TOPICAL | Status: DC | PRN
  Administered 2022-06-14: 1000 mL

## 2022-06-14 MED ORDER — ACETAMINOPHEN 325 MG PO TABS: 325.0000 mg | ORAL_TABLET | Freq: Four times a day (QID) | ORAL | Status: DC | PRN

## 2022-06-14 MED ORDER — ONDANSETRON HCL 4 MG/2ML IJ SOLN
INTRAMUSCULAR | Status: DC | PRN
  Administered 2022-06-14: 4 mg via INTRAVENOUS

## 2022-06-14 MED ORDER — METOCLOPRAMIDE HCL 5 MG PO TABS: 5.0000 mg | ORAL_TABLET | Freq: Three times a day (TID) | ORAL | Status: DC | PRN

## 2022-06-14 MED ORDER — CHLORHEXIDINE GLUCONATE 0.12 % MT SOLN: 15.0000 mL | Freq: Once | OROMUCOSAL | Status: AC

## 2022-06-14 MED ORDER — CHLORHEXIDINE GLUCONATE 0.12 % MT SOLN
OROMUCOSAL | Status: AC
  Administered 2022-06-14: 15 mL via OROMUCOSAL
  Filled 2022-06-14: qty 15

## 2022-06-14 MED ORDER — CEFAZOLIN SODIUM-DEXTROSE 2-4 GM/100ML-% IV SOLN
2.0000 g | Freq: Once | INTRAVENOUS | Status: AC
  Administered 2022-06-14: 2 g via INTRAVENOUS

## 2022-06-14 MED ORDER — PROPOFOL 10 MG/ML IV BOLUS
INTRAVENOUS | Status: DC | PRN
  Administered 2022-06-14: 50 mg via INTRAVENOUS

## 2022-06-14 MED ORDER — ROCURONIUM BROMIDE 10 MG/ML (PF) SYRINGE
PREFILLED_SYRINGE | INTRAVENOUS | Status: DC | PRN
  Administered 2022-06-14: 50 mg via INTRAVENOUS
  Administered 2022-06-14: 10 mg via INTRAVENOUS

## 2022-06-14 MED ORDER — ROCURONIUM BROMIDE 10 MG/ML (PF) SYRINGE
PREFILLED_SYRINGE | INTRAVENOUS | Status: AC
  Filled 2022-06-14: qty 10

## 2022-06-14 MED ORDER — FENTANYL CITRATE (PF) 250 MCG/5ML IJ SOLN
INTRAMUSCULAR | Status: AC
  Filled 2022-06-14: qty 5

## 2022-06-14 MED ORDER — INSULIN ASPART 100 UNIT/ML IJ SOLN
2.0000 [IU] | Freq: Once | INTRAMUSCULAR | Status: AC
  Administered 2022-06-14: 2 [IU] via SUBCUTANEOUS

## 2022-06-14 MED ORDER — METOCLOPRAMIDE HCL 5 MG/ML IJ SOLN
5.0000 mg | Freq: Three times a day (TID) | INTRAMUSCULAR | Status: DC | PRN
  Administered 2022-06-18: 10 mg via INTRAVENOUS
  Administered 2022-06-19: 5 mg via INTRAVENOUS
  Filled 2022-06-14 (×2): qty 2

## 2022-06-14 MED ORDER — LACTATED RINGERS IV SOLN: INTRAVENOUS | Status: DC

## 2022-06-14 MED ORDER — INSULIN ASPART 100 UNIT/ML IJ SOLN: 0.0000 [IU] | INTRAMUSCULAR | Status: DC | PRN

## 2022-06-14 MED ORDER — ADULT MULTIVITAMIN W/MINERALS CH
1.0000 | ORAL_TABLET | Freq: Every day | ORAL | Status: DC
  Administered 2022-06-15 – 2022-06-21 (×6): 1 via ORAL
  Filled 2022-06-14 (×6): qty 1

## 2022-06-14 MED ORDER — FENTANYL CITRATE (PF) 250 MCG/5ML IJ SOLN
INTRAMUSCULAR | Status: DC | PRN
  Administered 2022-06-14 (×4): 50 ug via INTRAVENOUS

## 2022-06-14 MED ORDER — SODIUM CHLORIDE 0.9 % IV SOLN: INTRAVENOUS | Status: AC

## 2022-06-14 MED ORDER — SUGAMMADEX SODIUM 200 MG/2ML IV SOLN
INTRAVENOUS | Status: DC | PRN
  Administered 2022-06-14: 200 mg via INTRAVENOUS

## 2022-06-14 MED ORDER — EPHEDRINE 5 MG/ML INJ
INTRAVENOUS | Status: AC
  Filled 2022-06-14: qty 5

## 2022-06-14 MED ORDER — PROMETHAZINE HCL 25 MG/ML IJ SOLN: 6.2500 mg | INTRAMUSCULAR | Status: DC | PRN

## 2022-06-14 MED ORDER — LIDOCAINE 2% (20 MG/ML) 5 ML SYRINGE
INTRAMUSCULAR | Status: DC | PRN
  Administered 2022-06-14: 40 mg via INTRAVENOUS

## 2022-06-14 MED ORDER — LIDOCAINE 2% (20 MG/ML) 5 ML SYRINGE
INTRAMUSCULAR | Status: AC
  Filled 2022-06-14: qty 5

## 2022-06-14 MED ORDER — ACETAMINOPHEN 325 MG PO TABS: 325.0000 mg | ORAL_TABLET | Freq: Once | ORAL | Status: DC | PRN

## 2022-06-14 MED ORDER — DOCUSATE SODIUM 100 MG PO CAPS
100.0000 mg | ORAL_CAPSULE | Freq: Two times a day (BID) | ORAL | Status: DC
  Administered 2022-06-14 – 2022-06-18 (×9): 100 mg via ORAL
  Filled 2022-06-14 (×9): qty 1

## 2022-06-14 MED ORDER — ONDANSETRON HCL 4 MG/2ML IJ SOLN
INTRAMUSCULAR | Status: AC
  Filled 2022-06-14: qty 2

## 2022-06-14 MED ORDER — STERILE WATER FOR IRRIGATION IR SOLN
Status: DC | PRN
  Administered 2022-06-14: 1000 mL

## 2022-06-14 MED ORDER — ACETAMINOPHEN 10 MG/ML IV SOLN: 1000.0000 mg | Freq: Once | INTRAVENOUS | Status: DC | PRN

## 2022-06-14 SURGICAL SUPPLY — 53 items
BAG COUNTER SPONGE SURGICOUNT (BAG) ×2 IMPLANT
BAG SPNG CNTER NS LX DISP (BAG) ×1
BIT DRILL CANN LG 4.3MM (BIT) IMPLANT
BNDG CMPR 5X6 CHSV STRCH STRL (GAUZE/BANDAGES/DRESSINGS) ×1
BNDG COHESIVE 6X5 TAN ST LF (GAUZE/BANDAGES/DRESSINGS) IMPLANT
BRUSH SCRUB EZ PLAIN DRY (MISCELLANEOUS) ×4 IMPLANT
COVER PERINEAL POST (MISCELLANEOUS) ×2 IMPLANT
COVER SURGICAL LIGHT HANDLE (MISCELLANEOUS) ×4 IMPLANT
DRAPE C-ARM 42X72 X-RAY (DRAPES) ×2 IMPLANT
DRAPE C-ARMOR (DRAPES) ×2 IMPLANT
DRAPE HALF SHEET 40X57 (DRAPES) IMPLANT
DRAPE INCISE IOBAN 66X45 STRL (DRAPES) ×2 IMPLANT
DRAPE ORTHO SPLIT 77X108 STRL (DRAPES)
DRAPE SURG ORHT 6 SPLT 77X108 (DRAPES) IMPLANT
DRAPE U-SHAPE 47X51 STRL (DRAPES) ×2 IMPLANT
DRESSING MEPILEX FLEX 4X4 (GAUZE/BANDAGES/DRESSINGS) ×2 IMPLANT
DRILL BIT CANN LG 4.3MM (BIT) ×1
DRSG EMULSION OIL 3X3 NADH (GAUZE/BANDAGES/DRESSINGS) ×2 IMPLANT
DRSG MEPILEX FLEX 4X4 (GAUZE/BANDAGES/DRESSINGS) ×1
DRSG MEPILEX POST OP 4X8 (GAUZE/BANDAGES/DRESSINGS) ×2 IMPLANT
ELECT REM PT RETURN 9FT ADLT (ELECTROSURGICAL) ×1
ELECTRODE REM PT RTRN 9FT ADLT (ELECTROSURGICAL) ×2 IMPLANT
GLOVE BIO SURGEON STRL SZ7.5 (GLOVE) ×2 IMPLANT
GLOVE BIO SURGEON STRL SZ8 (GLOVE) ×2 IMPLANT
GLOVE BIOGEL PI IND STRL 7.5 (GLOVE) ×2 IMPLANT
GLOVE BIOGEL PI IND STRL 8 (GLOVE) ×2 IMPLANT
GLOVE SURG ORTHO LTX SZ7.5 (GLOVE) ×4 IMPLANT
GOWN STRL REUS W/ TWL LRG LVL3 (GOWN DISPOSABLE) ×4 IMPLANT
GOWN STRL REUS W/ TWL XL LVL3 (GOWN DISPOSABLE) ×2 IMPLANT
GOWN STRL REUS W/TWL LRG LVL3 (GOWN DISPOSABLE) ×2
GOWN STRL REUS W/TWL XL LVL3 (GOWN DISPOSABLE) ×1
GUIDEPIN VERSANAIL DSP 3.2X444 (ORTHOPEDIC DISPOSABLE SUPPLIES) IMPLANT
KIT BASIN OR (CUSTOM PROCEDURE TRAY) ×2 IMPLANT
KIT TURNOVER KIT B (KITS) ×2 IMPLANT
MANIFOLD NEPTUNE II (INSTRUMENTS) ×2 IMPLANT
NAIL HIP FRACT 130D 11X180 (Screw) IMPLANT
NS IRRIG 1000ML POUR BTL (IV SOLUTION) ×2 IMPLANT
PACK GENERAL/GYN (CUSTOM PROCEDURE TRAY) ×2 IMPLANT
PAD ARMBOARD 7.5X6 YLW CONV (MISCELLANEOUS) ×4 IMPLANT
SCREW BONE CORTICAL 5.0X3 (Screw) IMPLANT
SCREW LAG HIP NAIL 10.5X95 (Screw) IMPLANT
STAPLER VISISTAT 35W (STAPLE) ×2 IMPLANT
STOCKINETTE IMPERVIOUS LG (DRAPES) IMPLANT
SUT ETHILON 2 0 PSLX (SUTURE) ×2 IMPLANT
SUT VIC AB 0 CT1 27 (SUTURE) ×1
SUT VIC AB 0 CT1 27XBRD ANBCTR (SUTURE) ×2 IMPLANT
SUT VIC AB 1 CT1 27 (SUTURE) ×1
SUT VIC AB 1 CT1 27XBRD ANBCTR (SUTURE) ×2 IMPLANT
SUT VIC AB 2-0 CT1 27 (SUTURE) ×1
SUT VIC AB 2-0 CT1 TAPERPNT 27 (SUTURE) ×2 IMPLANT
TOWEL GREEN STERILE (TOWEL DISPOSABLE) ×4 IMPLANT
TOWEL GREEN STERILE FF (TOWEL DISPOSABLE) ×2 IMPLANT
WATER STERILE IRR 1000ML POUR (IV SOLUTION) ×2 IMPLANT

## 2022-06-14 NOTE — Assessment & Plan Note (Addendum)
Resolved

## 2022-06-14 NOTE — Transfer of Care (Signed)
Immediate Anesthesia Transfer of Care Note  Patient: Chelsea [name]  Procedure(s) Performed: INTRAMEDULLARY (IM) NAIL INTERTROCHANTERIC, REPAIR OF RIGHT HIP FRACTURE (Right: Hip)  Patient Location: PACU  Anesthesia Type:General  Level of Consciousness: awake and alert   Airway & Oxygen Therapy: Patient Spontanous Breathing and Patient connected to face mask oxygen  Post-op Assessment: Report given to RN, Post -op Vital signs reviewed and stable, and Patient moving all extremities  Post vital signs: Reviewed and stable  Last Vitals:  Vitals Value Taken Time  BP 144/65 06/14/22 1401  Temp    Pulse    Resp 12 06/14/22 1405  SpO2    Vitals shown include unvalidated device data.  Last Pain:  Vitals:   06/14/22 0938  TempSrc:   PainSc: 0-No pain      Patients Stated Pain Goal: 0 (06/13/22 1478)  Complications: There were no known notable events for this encounter.

## 2022-06-14 NOTE — Anesthesia Procedure Notes (Addendum)
Procedure Name: Intubation Date/Time: 06/14/2022 12:33 PM  Performed by: Sharyn Dross, CRNAPre-anesthesia Checklist: Patient identified, Emergency Drugs available, Suction available and Patient being monitored Patient Re-evaluated:Patient Re-evaluated prior to induction Oxygen Delivery Method: Circle system utilized Preoxygenation: Pre-oxygenation with 100% oxygen Induction Type: IV induction Ventilation: Mask ventilation without difficulty Laryngoscope Size: Mac and 3 Grade View: Grade I Tube type: Oral Tube size: 7.0 mm Number of attempts: 1 Airway Equipment and Method: Stylet and Oral airway Placement Confirmation: ETT inserted through vocal cords under direct vision, positive ETCO2 and breath sounds checked- equal and bilateral Secured at: 22 cm Tube secured with: Tape Dental Injury: Teeth and Oropharynx as per pre-operative assessment

## 2022-06-14 NOTE — Progress Notes (Signed)
Initial Nutrition Assessment  DOCUMENTATION CODES:   Not applicable  INTERVENTION:  Once diet resumes, recommend: Glucerna Shake po TID, each supplement provides 220 kcal and 10 grams of protein to support increased nutrition needs MVI with minerals daily  NUTRITION DIAGNOSIS:   Increased nutrient needs related to hip fracture, post-op healing as evidenced by estimated needs.  GOAL:   Patient will meet greater than or equal to 90% of their needs  MONITOR:   Diet advancement, Supplement acceptance, Weight trends, Labs, Skin  REASON FOR ASSESSMENT:   Consult Hip fracture protocol  ASSESSMENT:   Pt admitted with R hip fracture following a fall. PMH significant for CVA with residual chronic R sided paresis (2021), HTN, T2DM, HLD, CKD stage IV, L sided carotid stenosis s/p CEA.  Pt off unit at time of visit for fixation of R hip fracture. Unable to obtain detailed nutrition related history at this time.   Pt uses rolling walker for ambulation for short distances. Not very active and lifestyle is bedridden/sedentary.   Meal completions: 6/2: 100% breakfast and lunch (heart healthy diet) NPO today for ORIF  Reviewed weight history. Pt's weight noted to have increased ~4kg within the last year. Since 04/16 her weight appears to have remained stable around 69 kg. Will continue to monitor throughout admission.   Medications: SSI0-9 units TID, sodium bicarbonate  Labs: sodium 134, potassium 5.3, BUN 58, Cr 2.80, GFR 17, CBG's 127-255 x24 hours, HgbA1c 8.1%  NUTRITION - FOCUSED PHYSICAL EXAM: Off unit. Deferred to follow up.  Per MD note, pt with noted loss of muscle and fat mass. Suspect a degree of malnutrition to be present, will reassess upon follow up.   Diet Order:   Diet Order             Diet NPO time specified  Diet effective midnight                   EDUCATION NEEDS:   No education needs have been identified at this time  Skin:  Skin Assessment:  Reviewed RN Assessment  Last BM:  6/1  Height:   Ht Readings from Last 1 Encounters:  06/14/22 5\' 7"  (1.702 m)    Weight:   Wt Readings from Last 1 Encounters:  06/14/22 69.4 kg   BMI:  Body mass index is 23.96 kg/m.  Estimated Nutritional Needs:   Kcal:  1700-1900  Protein:  85-100g  Fluid:  >/=1.7L  Drusilla Kanner, RDN, LDN Clinical Nutrition

## 2022-06-14 NOTE — Anesthesia Postprocedure Evaluation (Signed)
Anesthesia Post Note  Patient: Chelsea [name]  Procedure(s) Performed: INTRAMEDULLARY (IM) NAIL INTERTROCHANTERIC, REPAIR OF RIGHT HIP FRACTURE (Right: Hip)     Patient location during evaluation: PACU Anesthesia Type: General Level of consciousness: awake and alert Pain management: pain level controlled Vital Signs Assessment: post-procedure vital signs reviewed and stable Respiratory status: spontaneous breathing, nonlabored ventilation, respiratory function stable and patient connected to nasal cannula oxygen Cardiovascular status: blood pressure returned to baseline and stable Postop Assessment: no apparent nausea or vomiting Anesthetic complications: no  There were no known notable events for this encounter.  Last Vitals:  Vitals:   06/14/22 1445 06/14/22 1503  BP: 122/81 (!) 104/40  Pulse: 82   Resp: 19 16  Temp:  36.7 C  SpO2: 93%     Last Pain:  Vitals:   06/14/22 1503  TempSrc: Oral  PainSc:                  Shelton Silvas

## 2022-06-14 NOTE — Assessment & Plan Note (Addendum)
Cr baseline 2-2.2, today up to 2.8 preop.  Resolved to 1.8 with fluids.   - Hold olmesartan, torsemide  - Avoid nephrotoxins

## 2022-06-14 NOTE — Consult Note (Signed)
Orthopaedic Trauma Service (OTS) Consultation   Patient ID: Chelsea [name] MRN: [id number] DOB/AGE: [birth year] 77 y.o.   Reason for Consult: Right hip fracture Referring Physician: Netta Cedars, MD  HPI: Chelsea [name] is an 77 y.o. female s/p stroke with right sided weakness, living with daughter, household ambulator with walker, fell with acute right hip pain. Pain is currently controlled, aching and dull, sharp and severe with motion, without associated distal tingling or numbness, and improved with narcotics but those are making her nauseated. Surgery held for echo yesterday which showed EF of 70-75%.   Past Medical History:  Diagnosis Date   Allergy    Anemia    Blind left eye    Cardiac arrest (HCC)    Carotid artery occlusion    Cataract    Depression    Diabetes mellitus without complication (HCC)    Hyperlipidemia    Hypertension    Oxygen deficiency    Stroke [facility])     Past Surgical History:  Procedure Laterality Date   ENDARTERECTOMY Left 12/03/2019   Procedure: LEFT CAROTID ENDARTERECTOMY;  Surgeon: Chuck Hint, MD;  Location[facility] OR;  Service: Vascular;  Laterality: Left;   PATCH ANGIOPLASTY Left 12/03/2019   Procedure: PATCH ANGIOPLASTY Left Carotid Artery;  Surgeon: Chuck Hint, MD;  Location: [facility] OR;  Service: Vascular;  Laterality: Left;    Family History  Problem Relation Age of Onset   Hypertension Mother    Hyperlipidemia Mother    Diabetes Mother    Cancer Mother    Arthritis Mother    Heart attack Mother    Hypertension Father    Hyperlipidemia Father    Diabetes Father    Arthritis Father    Stroke Father    Hypertension Sister    Heart disease Sister    Heart attack Sister    Diabetes Sister    Depression Sister    COPD Sister    Arthritis Sister    Stroke Brother    Hypertension Brother    Heart disease Brother    Diabetes Brother    Depression Brother    Arthritis Brother    Stroke Daughter     Hypertension Daughter    Diabetes Daughter    Depression Daughter    Arthritis Daughter    Diabetes Son    Depression Son     Social History:  reports that she has quit smoking. Her smoking use included cigarettes. She has never used smokeless tobacco. She reports that she does not currently use alcohol. She reports that she does not use drugs.  Allergies:  Allergies  Allergen Reactions   Codeine Anaphylaxis and Swelling    Throat swells     Latex Anaphylaxis   Penicillins Anaphylaxis and Swelling    Throat closes     Sulfa Antibiotics Anaphylaxis, Other (See Comments) and Swelling    Lips and eye swell   Benzodiazepines Hives   Cephalosporins Other (See Comments)    unknown  Other Reaction(s): Other (See Comments)   Lidocaine Hives   Oxycodone-Acetaminophen Palpitations    Other Reaction(s): Other (See Comments)  unknown   Trazodone Other (See Comments)    Sweating  Other Reaction(s): Other (See Comments)  unknown   Atorvastatin Nausea And Vomiting    Vomiting  Other Reaction(s): GI Intolerance  Vomiting    Cephalexin Itching    Tolerating rocephin 06/25/20  Other Reaction(s): Other (See Comments)  unknown   Gabapentin Nausea  And Vomiting    Other Reaction(s): GI Intolerance   Pregabalin Nausea And Vomiting    Vomiting  Other Reaction(s): GI Intolerance  Vomiting     Medications: Prior to Admission:  Medications Prior to Admission  Medication Sig Dispense Refill Last Dose   carvedilol (COREG) 3.125 MG tablet TAKE ONE TABLET TWICE DAILY WITH MEAL(S) (Patient taking differently: Take 3.125 mg by mouth 2 (two) times daily with a meal.) 180 tablet 3 06/12/2022 at 08:30am   clopidogrel (PLAVIX) 75 MG tablet TAKE 1 TABLET ONCE DAILY 90 tablet 3 06/12/2022 at 08:30am   KERENDIA 10 MG TABS Take 1 tablet by mouth daily.   06/12/2022   olmesartan (BENICAR) 20 MG tablet Take 10 mg by mouth daily.   06/11/2022   Polyethyl Glycol-Propyl Glycol (SYSTANE FREE OP)  Place 1 drop into both eyes daily.   06/11/2022   rosuvastatin (CRESTOR) 5 MG tablet TAKE ONE TABLET EVERY OTHER DAY FOR FOURTEEN DAYS, THEN ONE DAILY (Patient taking differently: Take 5 mg by mouth daily.) 90 tablet 3 06/11/2022   tamsulosin (FLOMAX) 0.4 MG CAPS capsule Take 0.4 mg by mouth every evening.   06/12/2022   torsemide (DEMADEX) 20 MG tablet Take 20 mg by mouth daily. Takes 40 mg if swelling   06/12/2022   acetaminophen (TYLENOL) 500 MG tablet Take 1,000 mg by mouth every 6 (six) hours as needed for moderate pain.   unknown   Ferrous Sulfate (IRON) 325 (65 Fe) MG TABS Take 325 mg by mouth every other day.   unknown   guaiFENesin (MUCINEX) 600 MG 12 hr tablet Take 1 tablet (600 mg total) by mouth 2 (two) times daily. (Patient taking differently: Take 600 mg by mouth 2 (two) times daily as needed.) 14 tablet 0 unknown    Results for orders placed or performed during the hospital encounter of 06/12/22 (from the past 48 hour(s))  CBC with Differential     Status: Abnormal   Collection Time: 06/12/22  2:17 PM  Result Value Ref Range   WBC 14.9 (H) 4.0 - 10.5 K/uL   RBC 3.58 (L) 3.87 - 5.11 MIL/uL   Hemoglobin 11.0 (L) 12.0 - 15.0 g/dL   HCT 16.1 (L) 09.6 - 04.5 %   MCV 95.8 80.0 - 100.0 fL   MCH 30.7 26.0 - 34.0 pg   MCHC 32.1 30.0 - 36.0 g/dL   RDW 40.9 81.1 - 91.4 %   Platelets 229 150 - 400 K/uL   nRBC 0.0 0.0 - 0.2 %   Neutrophils Relative % 84 %   Neutro Abs 12.6 (H) 1.7 - 7.7 K/uL   Lymphocytes Relative 9 %   Lymphs Abs 1.3 0.7 - 4.0 K/uL   Monocytes Relative 5 %   Monocytes Absolute 0.7 0.1 - 1.0 K/uL   Eosinophils Relative 1 %   Eosinophils Absolute 0.1 0.0 - 0.5 K/uL   Basophils Relative 0 %   Basophils Absolute 0.0 0.0 - 0.1 K/uL   Immature Granulocytes 1 %   Abs Immature Granulocytes 0.08 (H) 0.00 - 0.07 K/uL    Comment: Performed at [facility], [address] [place], [place] [address]  Basic metabolic panel     Status: Abnormal   Collection Time: 06/12/22   4:05 PM  Result Value Ref Range   Sodium 137 135 - 145 mmol/L   Potassium 5.0 3.5 - 5.1 mmol/L   Chloride 106 98 - 111 mmol/L   CO2 21 (L) 22 - 32 mmol/L  Glucose, Bld 168 (H) 70 - 99 mg/dL    Comment: Glucose reference range applies only to samples taken after fasting for at least 8 hours.   BUN 47 (H) 8 - 23 mg/dL   Creatinine, Ser 1.61 (H) 0.44 - 1.00 mg/dL   Calcium 8.1 (L) 8.9 - 10.3 mg/dL   GFR, Estimated 24 (L) >60 mL/min    Comment: (NOTE) Calculated using the CKD-EPI Creatinine Equation (2021)    Anion gap 10 5 - 15    Comment: Performed at [facility], [address], [place], [place] [address]  Troponin I (High Sensitivity)     Status: None   Collection Time: 06/12/22  8:33 PM  Result Value Ref Range   Troponin I (High Sensitivity) 11 <18 ng/L    Comment: (NOTE) Elevated high sensitivity troponin I (hsTnI) values and significant  changes across serial measurements may suggest ACS but many other  chronic and acute conditions are known to elevate hsTnI results.  Refer to the "Links" section for chest pain algorithms and additional  guidance. Performed at [facility], [address], [place], [place] [address]   Glucose, capillary     Status: Abnormal   Collection Time: 06/12/22  9:45 PM  Result Value Ref Range   Glucose-Capillary 269 (H) 70 - 99 mg/dL    Comment: Glucose reference range applies only to samples taken after fasting for at least 8 hours.   Comment 1 Notify RN   CBC     Status: Abnormal   Collection Time: 06/13/22  2:12 AM  Result Value Ref Range   WBC 10.1 4.0 - 10.5 K/uL   RBC 3.46 (L) 3.87 - 5.11 MIL/uL   Hemoglobin 10.6 (L) 12.0 - 15.0 g/dL   HCT 11.9 (L) 14.7 - 82.9 %   MCV 97.7 80.0 - 100.0 fL   MCH 30.6 26.0 - 34.0 pg   MCHC 31.4 30.0 - 36.0 g/dL   RDW 56.2 13.0 - 86.5 %   Platelets 225 150 - 400 K/uL   nRBC 0.0 0.0 - 0.2 %    Comment: Performed at [facility], [address] [place], [place] [address]  Glucose,  capillary     Status: Abnormal   Collection Time: 06/13/22  7:47 AM  Result Value Ref Range   Glucose-Capillary 139 (H) 70 - 99 mg/dL    Comment: Glucose reference range applies only to samples taken after fasting for at least 8 hours.  Glucose, capillary     Status: Abnormal   Collection Time: 06/13/22 11:31 AM  Result Value Ref Range   Glucose-Capillary 213 (H) 70 - 99 mg/dL    Comment: Glucose reference range applies only to samples taken after fasting for at least 8 hours.  Glucose, capillary     Status: Abnormal   Collection Time: 06/13/22  4:56 PM  Result Value Ref Range   Glucose-Capillary 127 (H) 70 - 99 mg/dL    Comment: Glucose reference range applies only to samples taken after fasting for at least 8 hours.  Glucose, capillary     Status: Abnormal   Collection Time: 06/13/22  9:18 PM  Result Value Ref Range   Glucose-Capillary 255 (H) 70 - 99 mg/dL    Comment: Glucose reference range applies only to samples taken after fasting for at least 8 hours.  Surgical PCR screen     Status: Abnormal   Collection Time: 06/14/22 12:45 AM   Specimen: Nasal Mucosa; Nasal Swab  Result Value Ref Range   MRSA, PCR NEGATIVE NEGATIVE   Staphylococcus aureus POSITIVE (A) NEGATIVE    Comment: (NOTE) The Xpert SA Assay (FDA approved for NASAL specimens in patients 32 years of age and older), is one component of a comprehensive surveillance program. It is not intended to diagnose infection nor to guide or monitor treatment. Performed at [facility], [address], [place], [place] [address]   CBC     Status: Abnormal   Collection Time: 06/14/22  2:46 AM  Result Value Ref Range   WBC 8.8 4.0 - 10.5 K/uL   RBC 3.13 (L) 3.87 - 5.11 MIL/uL   Hemoglobin 9.8 (L) 12.0 - 15.0 g/dL   HCT 19.1 (L) 47.8 - 29.5 %   MCV 96.2 80.0 - 100.0 fL   MCH 31.3 26.0 - 34.0 pg   MCHC 32.6 30.0 - 36.0 g/dL   RDW 62.1 30.8 - 65.7 %   Platelets 190 150 - 400 K/uL   nRBC 0.0 0.0 - 0.2 %    Comment:  Performed at [facility], [address], [place], [place] [address]  Basic metabolic panel     Status: Abnormal   Collection Time: 06/14/22  2:46 AM  Result Value Ref Range   Sodium 134 (L) 135 - 145 mmol/L   Potassium 5.3 (H) 3.5 - 5.1 mmol/L   Chloride 100 98 - 111 mmol/L   CO2 25 22 - 32 mmol/L   Glucose, Bld 212 (H) 70 - 99 mg/dL    Comment: Glucose reference range applies only to samples taken after fasting for at least 8 hours.   BUN 58 (H) 8 - 23 mg/dL   Creatinine, Ser 2.95 (H) 0.44 - 1.00 mg/dL   Calcium 8.1 (L) 8.9 - 10.3 mg/dL   GFR, Estimated 17 (L) >60 mL/min    Comment: (NOTE) Calculated using the CKD-EPI Creatinine Equation (2021)    Anion gap 9 5 - 15    Comment: Performed at [facility], [address] [place], [place] [address]  Glucose, capillary     Status: Abnormal   Collection Time: 06/14/22  9:13 AM  Result Value Ref Range   Glucose-Capillary 194 (H) 70 - 99 mg/dL    Comment: Glucose reference range applies only to samples taken after fasting for at least 8 hours.  Glucose, capillary     Status: Abnormal   Collection Time: 06/14/22  9:37 AM  Result Value Ref Range   Glucose-Capillary 167 (H) 70 - 99 mg/dL    Comment: Glucose reference range applies only to samples taken after fasting for at least 8 hours.    ECHOCARDIOGRAM COMPLETE  Result Date: 06/13/2022    ECHOCARDIOGRAM REPORT   Patient Name:   Chelsea [name] Bower Date of Exam: 06/13/2022 Medical Rec #:  [id number]      Height:       67.0 in Accession #:    [id number]     Weight:       153.0 lb Date of Birth:  [birth year]      BSA:          1.805 m Patient Age:    77 years       BP:           124/74 mmHg Patient Gender: F              HR:           58 bpm. Exam Location:  Inpatient Procedure: 2D Echo, Cardiac Doppler and Color Doppler Indications:    CHF-Acute Diastolic I50.31  History:        Patient has prior history of Echocardiogram examinations, most                 recent 10/07/2020. Hip  Fracture; Risk Factors:Diabetes,                 Hypertension and Dyslipidemia.  Sonographer:    Aron Baba Referring Phys: [id number] Emeline General IMPRESSIONS  1. Left ventricular ejection fraction, by estimation, is 70 to 75%. The left ventricle has hyperdynamic function. The left ventricle has no regional wall motion abnormalities. There is mild left ventricular hypertrophy. Left ventricular diastolic parameters are consistent with Grade II diastolic dysfunction (pseudonormalization). Elevated left atrial pressure.  2. Right ventricular systolic function was not well visualized. The right ventricular size is not well visualized.  3. Right atrial size was mildly dilated.  4. The mitral valve is degenerative. Trivial mitral valve regurgitation. Moderate mitral annular calcification.  5. The aortic valve is tricuspid. Aortic valve regurgitation is not visualized. Aortic valve sclerosis/calcification is present, without any evidence of aortic stenosis. FINDINGS  Left Ventricle: Left ventricular ejection fraction, by estimation, is 70 to 75%. The left ventricle has hyperdynamic function. The left ventricle has no regional wall motion abnormalities. The left ventricular internal cavity size was small. There is mild left ventricular hypertrophy. Left ventricular diastolic parameters are consistent with Grade II diastolic dysfunction (pseudonormalization). Elevated left atrial pressure. Right Ventricle: The right ventricular size is not well visualized. Right vetricular wall thickness was not well visualized. Right ventricular systolic function was not well visualized. Left Atrium: Left atrial size was normal in size. Right Atrium: Right atrial size was mildly dilated. Pericardium: There is no evidence of pericardial effusion. Mitral Valve: The mitral valve is degenerative in appearance. Moderate mitral annular calcification. Trivial mitral valve regurgitation. MV peak gradient, 6.4 mmHg. The mean mitral valve gradient is  3.0 mmHg. Tricuspid Valve: The tricuspid valve is normal in structure. Tricuspid valve regurgitation is trivial. Aortic Valve: The aortic valve is tricuspid. Aortic valve regurgitation is not visualized. Aortic valve sclerosis/calcification is present, without any evidence of aortic stenosis. Pulmonic Valve: The pulmonic valve was not well visualized. Pulmonic valve regurgitation is trivial. Aorta: The aortic root is normal in size and structure. IAS/Shunts: The interatrial septum was not well visualized.  LEFT VENTRICLE PLAX 2D LVIDd:         3.40 cm   Diastology LVIDs:         2.10 cm   LV e' medial:    4.24 cm/s LV PW:         1.10 cm   LV E/e' medial:  25.0 LV IVS:        0.70 cm   LV e' lateral:   6.96 cm/s LVOT diam:     1.60 cm   LV E/e' lateral: 15.2 LV SV:         53 LV SV Index:   29 LVOT Area:     2.01 cm  RIGHT VENTRICLE RV S prime:     9.14 cm/s TAPSE (M-mode): 1.8 cm LEFT ATRIUM             Index        RIGHT ATRIUM           Index LA diam:        3.70 cm 2.05 cm/m   RA Area:  22.40 cm LA Vol (A2C):   49.0 ml 27.15 ml/m  RA Volume:   72.10 ml  39.95 ml/m LA Vol (A4C):   36.6 ml 20.28 ml/m LA Biplane Vol: 45.8 ml 25.38 ml/m  AORTIC VALVE             PULMONIC VALVE LVOT Vmax:   112.00 cm/s PR End Diast Vel: 5.71 msec LVOT Vmean:  76.500 cm/s LVOT VTI:    0.262 m  AORTA Ao Root diam: 3.50 cm Ao Asc diam:  3.70 cm MITRAL VALVE MV Area (PHT): 2.07 cm     SHUNTS MV Area VTI:   1.05 cm     Systemic VTI:  0.26 m MV Peak grad:  6.4 mmHg     Systemic Diam: 1.60 cm MV Mean grad:  3.0 mmHg MV Vmax:       1.26 m/s MV Vmean:      76.6 cm/s MV Decel Time: 366 msec MR Peak grad: 24.6 mmHg MR Vmax:      248.00 cm/s MV E velocity: 106.00 cm/s MV A velocity: 135.00 cm/s MV E/A ratio:  0.79 Epifanio Lesches MD Electronically signed by Epifanio Lesches MD Signature Date/Time: 06/13/2022/3:53:09 PM    Final    DG Knee Right Port  Result Date: 06/13/2022 CLINICAL DATA:  Hip fracture, right, closed,  initial encounter. EXAM: PORTABLE RIGHT KNEE - 1-2 VIEW COMPARISON:  None Available. FINDINGS: The bones are subjectively under mineralized. No acute fracture. The joint spaces are preserved. Minor peripheral spurring. Minimal knee joint effusion. No erosive change or focal bone abnormality. IMPRESSION: Minor osteoarthritis.  No acute fracture of the right knee. Electronically Signed   By: Narda Rutherford M.D.   On: 06/13/2022 15:29   DG Chest 1 View  Result Date: 06/12/2022 CLINICAL DATA:  COPD. Fall. Right hip fracture. EXAM: CHEST  1 VIEW COMPARISON:  Chest radiograph 02/05/2021 FINDINGS: Chronic cardiomegaly. Unchanged mediastinal contours. Diaphragmatic flattening was better demonstrated on prior exam not included a lateral view. Bandlike atelectasis or scarring at the left lung base. No acute airspace disease, pleural effusion or pneumothorax. On limited assessment, no acute osseous finding. IMPRESSION: Chronic cardiomegaly. Bandlike atelectasis or scarring at the left lung base. Electronically Signed   By: Narda Rutherford M.D.   On: 06/12/2022 19:14   DG Hip Port Unilat W or Wo Pelvis 1 View Right  Result Date: 06/12/2022 CLINICAL DATA:  Trauma EXAM: DG HIP (WITH OR WITHOUT PELVIS) 1V PORT RIGHT COMPARISON:  10/06/2020 FINDINGS: Acute intertrochanteric fracture is seen in proximal right femur. There is no dislocation. There is slight overriding of fracture fragments. IMPRESSION: Recent intertrochanteric fracture is seen in proximal right femur. Electronically Signed   By: Ernie Avena M.D.   On: 06/12/2022 15:06    Intake/Output      06/02 0701 06/03 0700 06/03 0701 06/04 0700   P.O. 480    Total Intake(mL/kg) 480 (6.9)    Urine (mL/kg/hr) 870 (0.5)    Emesis/NG output     Total Output 870    Net -390            ROS As above. No recent fever, bleeding abnormalities, urologic dysfunction, GI problems, or weight gain. Blood pressure (!) 144/47, pulse 71, temperature 98.4 F (36.9  C), temperature source Oral, resp. rate 16, height 5\' 7"  (1.702 m), weight 69.4 kg, SpO2 97 %. Physical Exam NCAT, Nauseated RLE Tender right hip and right knee Mild abrasions of the knee  Edema/ swelling controlled  Sens:  DPN, SPN, TN intact  Motor: EHL, FHL, and lessor toe ext and flex all intact grossly  Brisk cap refill, warm to touch   Assessment/Plan:  Right intertroch hip fracture Multiple medical problems with baseline impaired mobility: stroke, weakness, PVD, DM, CHF  The risks and benefits of surgery were discussed with the patient and her daughter, including the possibility of infection, nerve injury, vessel injury, wound breakdown, arthritis, symptomatic hardware, DVT/ PE, loss of motion, malunion, nonunion, and need for further surgery among others. These risks were acknowledged and consent provided to proceed.   Weightbearing: WBAT RLE Insicional and dressing care: Reinforce dressings as needed Showering: yes please VTE prophylaxis:  Plavix  indefinitely Pain control: Norco Follow - up plan: 2 weeks Contact information:  Myrene Galas MD, Montez Morita PA   Myrene Galas, MD [facility], [facility] [phone]  06/14/2022, 11:23 AM  [facility] [address] [place] [address] [phone] [phone] (F)    After 5pm and on the weekends please log on to Amion, go to orthopaedics and the look under the [facility] Call for the provider(s) on call. You can also call our office at [phone] and then follow the prompts to be connected to the call team.

## 2022-06-14 NOTE — Op Note (Signed)
02/11/2022  6:11 PM  PATIENT:  Chelsea [name]  12/17/1945 female   MEDICAL RECORD NUMBER: [id number]  PRE-OPERATIVE DIAGNOSIS:  RIGHT HIP INTERTROCHANTERIC FRACTURE  POST-OPERATIVE DIAGNOSIS:  RIGHT HIP INTERTROCHANTERIC FRACTURE  PROCEDURE:  INTRAMEDULLARY NAILING OF THE RIGHT HIP using a Biomet Affixus nail 11 mm diameter locked short.  SURGEON:  Doralee Albino. Carola Frost, M.D.  ASSISTANT:  Montez Morita, PA-C.  ANESTHESIA:  General.  COMPLICATIONS:  None.  ESTIMATED BLOOD LOSS:  Less than 150 mL.  DISPOSITION:  To PACU.  CONDITION:  Stable.  DELAY START OF DVT PROPHYLAXIS BECAUSE OF BLEEDING RISK: NO  BRIEF SUMMARY AND INDICATION OF PROCEDURE:  Chelsea [name] is a 77 y.o. year- old with multiple medical problems.  I discussed with the patient and family risks and benefits of surgical treatment including the potential for malunion, nonunion, symptomatic hardware, heart attack, stroke, neurovascular injury, bleeding, and others.  After full discussion, the patient and family wished to proceed.  BRIEF SUMMARY OF PROCEDURE:  The patient was taken to the operating room where general anesthesia was induced.  He was positioned supine on the Hana fracture table.  A closed reduction maneuver was performed of the fractured proximal femur and this was confirmed on both AP and lateral xray views. A thorough scrub and wash with chlorhexidine and then Betadine scrub and paint was performed.  After sterile drapes and time-out, a long instrument was used to identify the appropriate starting position under C-arm on both AP and lateral images.  A 3 cm incision was made proximal to the greater trochanter.  The curved cannulated awl was inserted just medial to the tip of the lateral trochanter and then the starting guidewire advanced into the proximal femur.  This was checked on AP and lateral views.  The starting reamer was engaged with the soft tissue protected by a sleeve.  The 11 mm short nail was then  inserted to the appropriate depth.  The guidewire for the lag screw was then inserted with the appropriate anteversion to make sure it was in a center-center position.  This was measured and the lag screw placed with excellent purchase and position checked on both views. The set screw was then engaged within the groove of the lag screw, which was allowed to telescope.  Traction was released and compression achieved with the screw.  This was followed by placement of one distal locking screw using the jig.  This was confirmed on AP and lateral images. Wounds were irrigated thoroughly, closed in a standard layered fashion. Sterile gently compressive dressings were applied.  Montez Morita, PA-C assisted throughout.  The patient was awakened from anesthesia and transported to the PACU in stable condition.  PROGNOSIS:  The patient will be weightbearing as tolerated with physical therapy beginning DVT prophylaxis as soon as deemed stable by the Primary Care Service.  She has no range of motion precautions.  We will continue to follow while in the hospital.  Anticipate follow up in the office in 2 weeks for removal of sutures and further evaluation. Resume Plavix.     Doralee Albino. Carola Frost, M.D.

## 2022-06-14 NOTE — Progress Notes (Signed)
Progress Note   Chelsea [name]: Chelsea Chelsea [name] NGE:[id number] DOB: Aug 14, 1945 DOA: 06/12/2022     2 DOS: Chelsea Chelsea [name] was seen and examined on 06/14/2022 at 4:15 PM      Brief hospital course: Chelsea Chelsea [name] is a 77 y.o. 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  In [facility], radiograph showed RIGHT hip fracture   6/1: Admitted 6/2: Got peripheral nerve block 6/3: Got IM nail by Dr. Carola Frost     Assessment and Plan: * Hip fracture, right, closed, initial encounter (HCC) S/p INTRAMEDULLARY (IM) NAIL INTERTROCHANTERIC, REPAIR OF RIGHT HIP FRACTURE 6/3 by Dr. Carola Frost - PT ordered - WB per Ortho - Analgesia per ortho - DVT PPx per Ortho - Resume Plavix tomorrow    AKI (acute kidney injury) on CKD IV Cr baseline 2-2.2, today up to 2.8 pre-op.  Also some hypotension post-op. - IV fluids - Avoid nephrotoxins - Hold olmesartan, torsemide - Avoid hypotension, hold finerenone, Coreg  Hyperkalemia Due to AKI - IV fluids  Wheezing Chelsea [name] was prescribed supplemental oxygen after her stroke many years ago but never uses it at home, family confirmed that she does not use oxygen at home as needed, she is not using alcohol.  Here she is not wheezing, has no dyspnea.  She has no swelling, palpitations, orthopnea, change in breathing from baseline.  I confirmed this twice with family, and I am uncertain what H&P was referring to.  Echo here with grade II DD, normal valves, normal EF.   - Continue ICS/LABA, started here, no need to continue at d/c  PVD (peripheral vascular disease) (HCC) - Resume Plavix - Continue aspirin, Crestor - Hold Coreg  Fall    Pure hypercholesterolemia - Continue Crestor  History of CVA (cerebrovascular accident) - Continue aspirin -  Resume Plavix tomorrow - Trend CBC - Continue Crestor  Malnutrition  of moderate degree As evidenced by loss of subcutaneous muscle mass and fat diffusely  CKD (chronic kidney disease), stage IV (HCC)    Essential hypertension BP low post-op - Hold Coreg, finerenone - Hold ARB due to AKI - Hold torsemide   Controlled type 2 diabetes mellitus with hyperglycemia, without long-term current use of insulin (HCC) A1c 8.1%.  Diet controlled. - Continue SS corrections          Subjective: Chelsea [name] has generalized malaise, nausea, fatigue, weakness, feels poorly.  No chest pain, dyspnea, wheezing, confusion, fever, focal weakness.  Pressure soft on return from PACU     Physical Exam: BP (!) 104/40 (BP Location: Left Arm)   Pulse 82   Temp 98.1 F (36.7 C) (Oral)   Resp 16   Ht 5\' 7"  (1.702 m)   Wt 69.4 kg   SpO2 93%   BMI 23.96 kg/m   Elderly adult female, lying in bed, appears weak and debilitated RRR, no murmurs, no pitting peripheral edema, no JVD Respiratory rate normal, lungs clear without rales or wheezes but overall diminished Abdomen soft without tenderness palpation or guarding Pale Hard of hearing, face symmetric, speech fluent, severe generalized weakness, mild tremor    Data Reviewed: Echocardiogram shows EF normal, grade 2 diastolic dysfunction, normal valves Glucose elevated Creatinine up to 2.8 Hemoglobin slightly down to 9.8 Sodium 134, potassium 5.3    Family Communication: Daughter at Chelsea bedside    Disposition: Status is: Inpatient  Author: Alberteen Sam, MD 06/14/2022 4:48 PM  For on call review [email or web address].

## 2022-06-14 NOTE — Consult Note (Signed)
Patient ID: Chelsea [name] MRN: [id number] DOB/AGE: [birth year] 77 y.o.  Admit date: 06/12/2022  Admission Diagnoses:  Principal Problem:   Hip fracture, right, closed, initial encounter [name]) Active Problems:   Controlled type 2 diabetes mellitus with hyperglycemia, without long-term current use of insulin (HCC)   Essential hypertension   CKD (chronic kidney disease), stage IV (HCC)   Malnutrition of moderate degree   History of CVA (cerebrovascular accident)   Pure hypercholesterolemia   AKI (acute kidney injury) (HCC)   Fall   PVD (peripheral vascular disease) (HCC)   Wheezing   Hyperkalemia   HPI: Ortho consult for right intertrochanteric hip fx (DOI 06/12/22).  PMH notable for prior cardiac arrest, CVA with residual right hemiparesis, anticoagulation (Plavix - last dose this AM per ER), amaurosis fugax, DM (A1c 8.0), CKD, HTN, HLD, depression, s/p left CEA for carotid artery stenosis.  Denies numbness/tingling. She has baseline RLE weakness from stroke in 2017.  Past Medical History: Past Medical History:  Diagnosis Date   Allergy    Anemia    Blind left eye    Cardiac arrest (HCC)    Carotid artery occlusion    Cataract    Depression    Diabetes mellitus without complication (HCC)    Hyperlipidemia    Hypertension    Oxygen deficiency    Stroke [name])     Surgical History: Past Surgical History:  Procedure Laterality Date   ENDARTERECTOMY Left 12/03/2019   Procedure: LEFT CAROTID ENDARTERECTOMY;  Surgeon: [facility] Hint, MD;  Location: [facility] OR;  Service: Vascular;  Laterality: Left;   PATCH ANGIOPLASTY Left 12/03/2019   Procedure: PATCH ANGIOPLASTY Left Carotid Artery;  Surgeon: [facility] Hint, MD;  Location: [name] OR;  Service: Vascular;  Laterality: Left;    Family History: Family History  Problem Relation Age of Onset   Hypertension Mother    Hyperlipidemia Mother    Diabetes Mother    Cancer Mother    Arthritis Mother    Heart attack  Mother    Hypertension Father    Hyperlipidemia Father    Diabetes Father    Arthritis Father    Stroke Father    Hypertension Sister    Heart disease Sister    Heart attack Sister    Diabetes Sister    Depression Sister    COPD Sister    Arthritis Sister    Stroke Brother    Hypertension Brother    Heart disease Brother    Diabetes Brother    Depression Brother    Arthritis Brother    Stroke Daughter    Hypertension Daughter    Diabetes Daughter    Depression Daughter    Arthritis Daughter    Diabetes Son    Depression Son     Social History: Social History   Socioeconomic History   Marital status: Divorced    Spouse name: Not on file   Number of children: Not on file   Years of education: Not on file   Highest education level: Not on file  Occupational History   Not on file  Tobacco Use   Smoking status: Former    Types: Cigarettes   Smokeless tobacco: Never  Vaping Use   Vaping Use: Never used  Substance and Sexual Activity   Alcohol use: Not Currently   Drug use: Never   Sexual activity: Not Currently  Other Topics Concern   Not on file  Social History Narrative   Lives with daughter, Chelsea [name]  Right Handed   Drinks no caffeine   Social Determinants of Health   Financial Resource Strain: Not on file  Food Insecurity: No Food Insecurity (06/13/2022)   Hunger Vital Sign    Worried About Running Out of Food in the Last Year: Never true    Ran Out of Food in the Last Year: Never true  Transportation Needs: No Transportation Needs (06/13/2022)   PRAPARE - Administrator, Civil Service (Medical): No    Lack of Transportation (Non-Medical): No  Physical Activity: Not on file  Stress: Not on file  Social Connections: Not on file  Intimate Partner Violence: Not At Risk (06/13/2022)   Humiliation, Afraid, Rape, and Kick questionnaire    Fear of Current or Ex-Partner: No    Emotionally Abused: No    Physically Abused: No    Sexually Abused: No     Allergies: Codeine, Latex, Penicillins, Sulfa antibiotics, Benzodiazepines, Cephalosporins, Lidocaine, Oxycodone-acetaminophen, Trazodone, Atorvastatin, Cephalexin, Gabapentin, and Pregabalin  Medications: I have reviewed the patient's current medications.  Vital Signs: Patient Vitals for the past 24 hrs:  BP Temp Temp src Pulse Resp SpO2  06/14/22 0846 -- -- -- -- -- 97 %  06/14/22 0844 -- -- -- -- -- 97 %  06/14/22 0840 -- -- -- 62 16 97 %  06/14/22 0805 (!) 122/58 98.3 F (36.8 C) Oral 63 -- 100 %  06/14/22 0515 (!) 149/54 98.5 F (36.9 C) Oral 66 18 97 %  06/14/22 0100 (!) 107/49 98.9 F (37.2 C) Oral 60 18 93 %  06/13/22 2047 (!) 147/38 98.4 F (36.9 C) Oral 63 17 100 %  06/13/22 2036 -- -- -- -- -- 99 %  06/13/22 1809 (!) 123/47 98.3 F (36.8 C) Oral (!) 59 -- 100 %  06/13/22 1745 (!) 129/50 -- -- (!) 59 18 99 %  06/13/22 1740 (!) 114/52 -- -- (!) 56 12 99 %  06/13/22 1730 (!) 144/46 -- -- (!) 57 15 100 %  06/13/22 1718 (!) 152/66 -- -- (!) 58 12 100 %  06/13/22 1656 (!) 123/52 98 F (36.7 C) -- (!) 57 18 100 %  06/13/22 0941 -- -- -- -- -- 98 %    Radiology: ECHOCARDIOGRAM COMPLETE  Result Date: 06/13/2022    ECHOCARDIOGRAM REPORT   Patient Name:   Chelsea [name] Date of Exam: 06/13/2022 Medical Rec #:  [id number]      Height:       67.0 in Accession #:    [id number]     Weight:       153.0 lb Date of Birth:  06-20-1945      BSA:          1.805 m Patient Age:    77 years       BP:           124/74 mmHg Patient Gender: F              HR:           58 bpm. Exam Location:  Inpatient Procedure: 2D Echo, Cardiac Doppler and Color Doppler Indications:    CHF-Acute Diastolic I50.31  History:        Patient has prior history of Echocardiogram examinations, most                 recent 10/07/2020. Hip Fracture; Risk Factors:Diabetes,                 Hypertension and Dyslipidemia.  Sonographer:    Chelsea [name] Referring Phys: [id number] [name] IMPRESSIONS  1. Left ventricular  ejection fraction, by estimation, is 70 to 75%. The left ventricle has hyperdynamic function. The left ventricle has no regional wall motion abnormalities. There is mild left ventricular hypertrophy. Left ventricular diastolic parameters are consistent with Grade II diastolic dysfunction (pseudonormalization). Elevated left atrial pressure.  2. Right ventricular systolic function was not well visualized. The right ventricular size is not well visualized.  3. Right atrial size was mildly dilated.  4. The mitral valve is degenerative. Trivial mitral valve regurgitation. Moderate mitral annular calcification.  5. The aortic valve is tricuspid. Aortic valve regurgitation is not visualized. Aortic valve sclerosis/calcification is present, without any evidence of aortic stenosis. FINDINGS  Left Ventricle: Left ventricular ejection fraction, by estimation, is 70 to 75%. The left ventricle has hyperdynamic function. The left ventricle has no regional wall motion abnormalities. The left ventricular internal cavity size was small. There is mild left ventricular hypertrophy. Left ventricular diastolic parameters are consistent with Grade II diastolic dysfunction (pseudonormalization). Elevated left atrial pressure. Right Ventricle: The right ventricular size is not well visualized. Right vetricular wall thickness was not well visualized. Right ventricular systolic function was not well visualized. Left Atrium: Left atrial size was normal in size. Right Atrium: Right atrial size was mildly dilated. Pericardium: There is no evidence of pericardial effusion. Mitral Valve: The mitral valve is degenerative in appearance. Moderate mitral annular calcification. Trivial mitral valve regurgitation. MV peak gradient, 6.4 mmHg. The mean mitral valve gradient is 3.0 mmHg. Tricuspid Valve: The tricuspid valve is normal in structure. Tricuspid valve regurgitation is trivial. Aortic Valve: The aortic valve is tricuspid. Aortic valve  regurgitation is not visualized. Aortic valve sclerosis/calcification is present, without any evidence of aortic stenosis. Pulmonic Valve: The pulmonic valve was not well visualized. Pulmonic valve regurgitation is trivial. Aorta: The aortic root is normal in size and structure. IAS/Shunts: The interatrial septum was not well visualized.  LEFT VENTRICLE PLAX 2D LVIDd:         3.40 cm   Diastology LVIDs:         2.10 cm   LV e' medial:    4.24 cm/s LV PW:         1.10 cm   LV E/e' medial:  25.0 LV IVS:        0.70 cm   LV e' lateral:   6.96 cm/s LVOT diam:     1.60 cm   LV E/e' lateral: 15.2 LV SV:         53 LV SV Index:   29 LVOT Area:     2.01 cm  RIGHT VENTRICLE RV S prime:     9.14 cm/s TAPSE (M-mode): 1.8 cm LEFT ATRIUM             Index        RIGHT ATRIUM           Index LA diam:        3.70 cm 2.05 cm/m   RA Area:     22.40 cm LA Vol (A2C):   49.0 ml 27.15 ml/m  RA Volume:   72.10 ml  39.95 ml/m LA Vol (A4C):   36.6 ml 20.28 ml/m LA Biplane Vol: 45.8 ml 25.38 ml/m  AORTIC VALVE             PULMONIC VALVE LVOT Vmax:   112.00 cm/s PR End Diast Vel: 5.71 msec LVOT Vmean:  76.500 cm/s  LVOT VTI:    0.262 m  AORTA Ao Root diam: 3.50 cm Ao Asc diam:  3.70 cm MITRAL VALVE MV Area (PHT): 2.07 cm     SHUNTS MV Area VTI:   1.05 cm     Systemic VTI:  0.26 m MV Peak grad:  6.4 mmHg     Systemic Diam: 1.60 cm MV Mean grad:  3.0 mmHg MV Vmax:       1.26 m/s MV Vmean:      76.6 cm/s MV Decel Time: 366 msec MR Peak grad: 24.6 mmHg MR Vmax:      248.00 cm/s MV E velocity: 106.00 cm/s MV A velocity: 135.00 cm/s MV E/A ratio:  0.79 Epifanio Lesches MD Electronically signed by Epifanio Lesches MD Signature Date/Time: 06/13/2022/3:53:09 PM    Final    DG Knee Right Port  Result Date: 06/13/2022 CLINICAL DATA:  Hip fracture, right, closed, initial encounter. EXAM: PORTABLE RIGHT KNEE - 1-2 VIEW COMPARISON:  None Available. FINDINGS: The bones are subjectively under mineralized. No acute fracture. The joint spaces  are preserved. Minor peripheral spurring. Minimal knee joint effusion. No erosive change or focal bone abnormality. IMPRESSION: Minor osteoarthritis.  No acute fracture of the right knee. Electronically Signed   By: Narda Rutherford M.D.   On: 06/13/2022 15:29   DG Chest 1 View  Result Date: 06/12/2022 CLINICAL DATA:  COPD. Fall. Right hip fracture. EXAM: CHEST  1 VIEW COMPARISON:  Chest radiograph 02/05/2021 FINDINGS: Chronic cardiomegaly. Unchanged mediastinal contours. Diaphragmatic flattening was better demonstrated on prior exam not included a lateral view. Bandlike atelectasis or scarring at the left lung base. No acute airspace disease, pleural effusion or pneumothorax. On limited assessment, no acute osseous finding. IMPRESSION: Chronic cardiomegaly. Bandlike atelectasis or scarring at the left lung base. Electronically Signed   By: Narda Rutherford M.D.   On: 06/12/2022 19:14   DG Hip Port Unilat W or Wo Pelvis 1 View Right  Result Date: 06/12/2022 CLINICAL DATA:  Trauma EXAM: DG HIP (WITH OR WITHOUT PELVIS) 1V PORT RIGHT COMPARISON:  10/06/2020 FINDINGS: Acute intertrochanteric fracture is seen in proximal right femur. There is no dislocation. There is slight overriding of fracture fragments. IMPRESSION: Recent intertrochanteric fracture is seen in proximal right femur. Electronically Signed   By: Ernie Avena M.D.   On: 06/12/2022 15:06    Labs: Recent Labs    06/13/22 0212 06/14/22 0246  WBC 10.1 8.8  RBC 3.46* 3.13*  HCT 33.8* 30.1*  PLT 225 190   Recent Labs    06/12/22 1605 06/14/22 0246  NA 137 134*  K 5.0 5.3*  CL 106 100  CO2 21* 25  BUN 47* 58*  CREATININE 2.08* 2.80*  GLUCOSE 168* 212*  CALCIUM 8.1* 8.1*   No results for input(s): "LABPT", "INR" in the last 72 hours.  Review of Systems: ROS as detailed in HPI  Physical Exam: Body mass index is 23.96 kg/m.  Physical Exam   Gen: AAOx3, NAD Comfortable at rest  Right Lower Extremity: Skin  intact TTP over proximal femur Shortened, ER + ADF/APF/EHL 3/5 (baseline per family) SILT throughout DP, PT 2+ to palp CR < 2s   Assessment and Plan: Ortho consult for right intertrochanteric hip fx (DOI 06/12/22 with extensive comorbidities and on anticoagulation (last dose Plavix 06/12/22)  -history, exam and imaging reviewed at length with patient/family -advise surgery in the form of right hip ORIF (intramedullary nail fixation) -timing of surgery will be 06/15/22 given Plavix and medical workup  necessary preop -appreciate primary team assistance -TDWB RLE with walker and 2 person assist pending surgery  -PT/OT postop  Netta Cedars, MD Orthopaedic Surgeon [facility] [phone]  The risks and benefits were presented and reviewed. The risks due to hardware failure/irritation, new/persistent infection, stiffness, nerve/vessel/tendon injury, nonunion/malunion, wound healing issues, allograft usage, development of arthritis, failure of this surgery, possibility of external fixation, possibility of delayed definitive surgery, need for further surgery, thromboembolic events, anesthesia/medical complications, amputation, death among others were discussed. The patient acknowledged the explanation, agreed to proceed with the plan.

## 2022-06-15 ENCOUNTER — Encounter (HOSPITAL_COMMUNITY): Payer: Self-pay | Admitting: Orthopedic Surgery

## 2022-06-15 DIAGNOSIS — N184 Chronic kidney disease, stage 4 (severe): Secondary | ICD-10-CM | POA: Diagnosis not present

## 2022-06-15 DIAGNOSIS — N179 Acute kidney failure, unspecified: Secondary | ICD-10-CM | POA: Diagnosis not present

## 2022-06-15 DIAGNOSIS — S72001A Fracture of unspecified part of neck of right femur, initial encounter for closed fracture: Secondary | ICD-10-CM | POA: Diagnosis not present

## 2022-06-15 DIAGNOSIS — E1165 Type 2 diabetes mellitus with hyperglycemia: Secondary | ICD-10-CM | POA: Diagnosis not present

## 2022-06-15 LAB — CBC
HCT: 28.1 % — ABNORMAL LOW (ref 36.0–46.0)
HCT: 25.4 % — ABNORMAL LOW (ref 36.0–46.0)
Hemoglobin: 9.1 g/dL — ABNORMAL LOW (ref 12.0–15.0)
Hemoglobin: 8 g/dL — ABNORMAL LOW (ref 12.0–15.0)
MCH: 31.9 pg (ref 26.0–34.0)
MCH: 30.9 pg (ref 26.0–34.0)
MCHC: 32.4 g/dL (ref 30.0–36.0)
MCHC: 31.5 g/dL (ref 30.0–36.0)
MCV: 98.6 fL (ref 80.0–100.0)
MCV: 98.1 fL (ref 80.0–100.0)
Platelets: 180 10*3/uL (ref 150–400)
Platelets: 183 10*3/uL (ref 150–400)
RBC: 2.85 MIL/uL — ABNORMAL LOW (ref 3.87–5.11)
RBC: 2.59 MIL/uL — ABNORMAL LOW (ref 3.87–5.11)
RDW: 12.5 % (ref 11.5–15.5)
RDW: 12.3 % (ref 11.5–15.5)
WBC: 7.6 10*3/uL (ref 4.0–10.5)
WBC: 7.2 10*3/uL (ref 4.0–10.5)
nRBC: 0 % (ref 0.0–0.2)
nRBC: 0 % (ref 0.0–0.2)

## 2022-06-15 LAB — COMPREHENSIVE METABOLIC PANEL
ALT: 12 U/L (ref 0–44)
AST: 20 U/L (ref 15–41)
Albumin: 2.8 g/dL — ABNORMAL LOW (ref 3.5–5.0)
Alkaline Phosphatase: 69 U/L (ref 38–126)
Anion gap: 10 (ref 5–15)
BUN: 54 mg/dL — ABNORMAL HIGH (ref 8–23)
CO2: 24 mmol/L (ref 22–32)
Calcium: 8.2 mg/dL — ABNORMAL LOW (ref 8.9–10.3)
Chloride: 101 mmol/L (ref 98–111)
Creatinine, Ser: 2.62 mg/dL — ABNORMAL HIGH (ref 0.44–1.00)
GFR, Estimated: 18 mL/min — ABNORMAL LOW (ref >60)
Glucose, Bld: 204 mg/dL — ABNORMAL HIGH (ref 70–99)
Potassium: 5.2 mmol/L — ABNORMAL HIGH (ref 3.5–5.1)
Sodium: 135 mmol/L (ref 135–145)
Total Bilirubin: 0.7 mg/dL (ref 0.3–1.2)
Total Protein: 5.3 g/dL — ABNORMAL LOW (ref 6.5–8.1)

## 2022-06-15 LAB — GLUCOSE, CAPILLARY
Glucose-Capillary: 160 mg/dL — ABNORMAL HIGH (ref 70–99)
Glucose-Capillary: 202 mg/dL — ABNORMAL HIGH (ref 70–99)
Glucose-Capillary: 142 mg/dL — ABNORMAL HIGH (ref 70–99)
Glucose-Capillary: 139 mg/dL — ABNORMAL HIGH (ref 70–99)

## 2022-06-15 LAB — MAGNESIUM: Magnesium: 2.2 mg/dL (ref 1.7–2.4)

## 2022-06-15 MED ORDER — SODIUM CHLORIDE 0.9 % IV BOLUS
500.0000 mL | Freq: Once | INTRAVENOUS | Status: AC
  Administered 2022-06-15: 500 mL via INTRAVENOUS

## 2022-06-15 MED ORDER — VITAMIN D 25 MCG (1000 UNIT) PO TABS
2000.0000 [IU] | ORAL_TABLET | Freq: Every day | ORAL | Status: DC
  Administered 2022-06-15 – 2022-06-20 (×5): 2000 [IU] via ORAL
  Filled 2022-06-15 (×5): qty 2

## 2022-06-15 NOTE — Inpatient Diabetes Management (Signed)
Inpatient Diabetes Program Recommendations  AACE/ADA: New Consensus Statement on Inpatient Glycemic Control   Target Ranges:  Prepandial:   less than 140 mg/dL      Peak postprandial:   less than 180 mg/dL (1-2 hours)      Critically ill patients:  140 - 180 mg/dL    Latest Reference Range & Units 06/15/22 07:52  Glucose-Capillary 70 - 99 mg/dL 161 (H)    Latest Reference Range & Units 06/14/22 09:13 06/14/22 09:37 06/14/22 11:35 06/14/22 14:06 06/14/22 16:22 06/14/22 16:31 06/14/22 19:55  Glucose-Capillary 70 - 99 mg/dL 096 (H) 045 (H) 409 (H) 144 (H) 182 (H) 190 (H) 254 (H)   Review of Glycemic Control  Diabetes history: DM2 Outpatient Diabetes medications: none; diet controlled Current orders for Inpatient glycemic control: Novolog 0-9 units TID with meals  Inpatient Diabetes Program Recommendations:    Oral DM medication: May want to consider ordering Tradjenta 5 mg daily as inpatient and also consider discharging patient on same.  HbgA1C:  A1C 8.1% on 06/12/22 indicating an average glucose of 186 mg/dl over the past 2-3 months.  Thanks, Orlando Penner, RN, MSN, CDCES Diabetes Coordinator Inpatient Diabetes Program [phone] (Team Pager from 8am to 5pm)

## 2022-06-15 NOTE — Progress Notes (Addendum)
Orthopaedic Trauma Service Progress Note  Patient ID: Chelsea [name] MRN: [id number] DOB/AGE: December 07, 1945 77 y.o.  Subjective:  Doing ok POD 1  No pain in R hip currently but apprehensive about therapy  PT in room and ready to work with pt  Pt did c/o of light headedness going from supine to seated.  O2 sat 100%, HR 92. BP seated was in the upper 90's/upper 70's (MAP in mid 70's)  Walker dependent at baseline, R sided weakness due to previous stroke    ROS As above  Objective:   VITALS:   Vitals:   06/15/22 0728 06/15/22 0732 06/15/22 0733 06/15/22 0751  BP:    125/68  Pulse: 77   75  Resp: 18     Temp:    99 F (37.2 C)  TempSrc:    Oral  SpO2: 96% 96% 96% 100%  Weight:      Height:        Estimated body mass index is 23.96 kg/m as calculated from the following:   Height as of this encounter: 5\' 7"  (1.702 m).   Weight as of this encounter: 69.4 kg.   Intake/Output      06/03 0701 06/04 0700 06/04 0701 06/05 0700   P.O.     I.V. (mL/kg) 400 (5.8)    IV Piggyback 411.9    Total Intake(mL/kg) 811.9 (11.7)    Urine (mL/kg/hr) 200 (0.1) 450 (1.2)   Blood 50    Total Output 250 450   Net +561.9 -450        Urine Occurrence 1 x      LABS  Results for orders placed or performed during the hospital encounter of 06/12/22 (from the past 24 hour(s))  Glucose, capillary     Status: Abnormal   Collection Time: 06/14/22  2:06 PM  Result Value Ref Range   Glucose-Capillary 144 (H) 70 - 99 mg/dL  Basic metabolic panel     Status: Abnormal   Collection Time: 06/14/22  3:48 PM  Result Value Ref Range   Sodium 136 135 - 145 mmol/L   Potassium 5.4 (H) 3.5 - 5.1 mmol/L   Chloride 103 98 - 111 mmol/L   CO2 23 22 - 32 mmol/L   Glucose, Bld 213 (H) 70 - 99 mg/dL   BUN 52 (H) 8 - 23 mg/dL   Creatinine, Ser 4.09 (H) 0.44 - 1.00 mg/dL   Calcium 8.5 (L) 8.9 - 10.3 mg/dL   GFR, Estimated 18 (L) >60  mL/min   Anion gap 10 5 - 15  Glucose, capillary     Status: Abnormal   Collection Time: 06/14/22  4:22 PM  Result Value Ref Range   Glucose-Capillary 182 (H) 70 - 99 mg/dL  Glucose, capillary     Status: Abnormal   Collection Time: 06/14/22  4:31 PM  Result Value Ref Range   Glucose-Capillary 190 (H) 70 - 99 mg/dL  Glucose, capillary     Status: Abnormal   Collection Time: 06/14/22  7:55 PM  Result Value Ref Range   Glucose-Capillary 254 (H) 70 - 99 mg/dL  CBC     Status: Abnormal   Collection Time: 06/15/22  3:31 AM  Result Value Ref Range   WBC 7.6 4.0 - 10.5 K/uL   RBC 2.85 (L) 3.87 - 5.11  MIL/uL   Hemoglobin 9.1 (L) 12.0 - 15.0 g/dL   HCT 16.1 (L) 09.6 - 04.5 %   MCV 98.6 80.0 - 100.0 fL   MCH 31.9 26.0 - 34.0 pg   MCHC 32.4 30.0 - 36.0 g/dL   RDW 40.9 81.1 - 91.4 %   Platelets 180 150 - 400 K/uL   nRBC 0.0 0.0 - 0.2 %  Comprehensive metabolic panel     Status: Abnormal   Collection Time: 06/15/22  3:31 AM  Result Value Ref Range   Sodium 135 135 - 145 mmol/L   Potassium 5.2 (H) 3.5 - 5.1 mmol/L   Chloride 101 98 - 111 mmol/L   CO2 24 22 - 32 mmol/L   Glucose, Bld 204 (H) 70 - 99 mg/dL   BUN 54 (H) 8 - 23 mg/dL   Creatinine, Ser 7.82 (H) 0.44 - 1.00 mg/dL   Calcium 8.2 (L) 8.9 - 10.3 mg/dL   Total Protein 5.3 (L) 6.5 - 8.1 g/dL   Albumin 2.8 (L) 3.5 - 5.0 g/dL   AST 20 15 - 41 U/L   ALT 12 0 - 44 U/L   Alkaline Phosphatase 69 38 - 126 U/L   Total Bilirubin 0.7 0.3 - 1.2 mg/dL   GFR, Estimated 18 (L) >60 mL/min   Anion gap 10 5 - 15  Magnesium     Status: None   Collection Time: 06/15/22  3:31 AM  Result Value Ref Range   Magnesium 2.2 1.7 - 2.4 mg/dL  Glucose, capillary     Status: Abnormal   Collection Time: 06/15/22  7:52 AM  Result Value Ref Range   Glucose-Capillary 160 (H) 70 - 99 mg/dL  Glucose, capillary     Status: Abnormal   Collection Time: 06/15/22 11:22 AM  Result Value Ref Range   Glucose-Capillary 202 (H) 70 - 99 mg/dL     PHYSICAL EXAM:    Gen: in bed, NAD, pleasant, family present  Lungs: unlabored  Cardiac: reg pulse  Ext:       Right Lower Extremity              Dressings clean, dry and intact             Ext warm              + dp pulse             No DCT             No pitting edema             Swelling minimal              EHL, FHL, lesser toe motor intact             Ankle flexion, extension, inversion and eversion intact             + Quad set              Good perfusion distally      Assessment/Plan: 1 Day Post-Op   Principal Problem:   Hip fracture, right, closed, initial encounter (HCC) Active Problems:   Controlled type 2 diabetes mellitus with hyperglycemia, without long-term current use of insulin (HCC)   Essential hypertension   CKD (chronic kidney disease), stage IV (HCC)   Malnutrition of moderate degree   History of CVA (cerebrovascular accident)   Pure hypercholesterolemia   AKI (acute kidney injury) on CKD IV   Fall   PVD (peripheral  vascular disease) (HCC)   Wheezing   Hyperkalemia   Anti-infectives (From admission, onward)    Start     Dose/Rate Route Frequency Ordered Stop   06/14/22 1200  ceFAZolin (ANCEF) IVPB 2g/100 mL premix       Note to Pharmacy: Dr. Carola Frost reviewed allergies, wants Ancef to be given   2 g 200 mL/hr over 30 Minutes Intravenous  Once 06/14/22 1147 06/14/22 1304     .  POD/HD#: 1  77 y/o female with complex medical history s/p fall with R hip fracture   -fragility fracture R hip s/p IMN R hip  Weightbearing WBAT R leg with walker/assistance   ROM/Activity   Unrestricted ROM R hip and knee    Activity as tolerated     Wound care   Dressing changes right hip starting 06/17/2022  PT/OT evals   Ice PRN R hip    - Pain management:  Multimodal   Minimize narcotics  - ABL anemia/Hemodynamics  Soft BPs this pm, I have discussed with medicine team    Will give NS bolus of 500cc today and recheck CBC   Blood loss was not significant  yesterday   I do think a lot of it has to do with the fact the pt has not been out of bed since the 1st   - Medical issues   Per primary   - DVT/PE prophylaxis:  Scds  Resumed plavix  - ID:   Periop abx  - Metabolic Bone Disease:  Vitamin deficiency    Noted on outside labs about 4 days ago    Supplement  This hip fracture is a fragility fracture  Outpt workup    Referral to osteoporosis clinic    DEXA    Appreciate RD recs   - Activity:  As above  - FEN  NS bolus today   - Impediments to fracture healing:  Vitamin d deficiency   Fragility fracture  Age   DM   - Dispo:  Therapy evals  TOC consult   Ortho issues stable      Mearl Latin, PA-C [phone] (C) 06/15/2022, 12:26 PM  [facility] [address] [place] [address] [phone] [phone] (F)    After 5pm and on the weekends please log on to Amion, go to orthopaedics and the look under the [facility] Call for the provider(s) on call. You can also call our office at [phone] and then follow the prompts to be connected to the call team.  Patient ID: Chelsea [name], female   DOB: 1945-02-28, 77 y.o.   MRN: [id number]

## 2022-06-15 NOTE — Progress Notes (Signed)
Physical Therapy Evaluation Patient Details Name: Chelsea [name] MRN: [id number] DOB: 1945-09-22 Today's Date: 06/15/2022  History of Present Illness  77 yo female with onset of mechanical fall was admitted 6/1 for management. Had R intertrochanteric fracture with IM nailing done 6/3.  Has been walking only short trips at baseline, SOB with pt requiring HOB elevated at night. PMHx:  bronchitis, Stroke, R hemiparesis, LBBB, COPD, carotid stenosis, HTN, MI, L eye blind, DM, R hippocampus infarct  Clinical Impression  Pt was seen with family in attendance and was encouraged by them to participate.  During session pt sat on side of bed and reported light headed feelings, orthostatics taken:  sitting 97/67 (78) pulse 73, sat 100%;  standing 104/81 (88), sats 100% pulse 74.  Will work toward more mobility and have asked for family support on rehab placement, and will await the decision for her to see what options are given.  Pt is recommended to do PT with goals as are outlined below.       Recommendations for follow up therapy are one component of a multi-disciplinary discharge planning process, led by the attending physician.  Recommendations may be updated based on patient status, additional functional criteria and insurance authorization.  Follow Up Recommendations Can patient physically be transported by private vehicle: No     Assistance Recommended at Discharge Frequent or constant Supervision/Assistance  Patient can return home with the following  Two people to help with walking and/or transfers;Two people to help with bathing/dressing/bathroom;Assistance with cooking/housework;Assist for transportation;Help with stairs or ramp for entrance    Equipment Recommendations Other (comment) (TBD)  Recommendations for Other Services  Rehab consult    Functional Status Assessment Patient has had a recent decline in their functional status and demonstrates the ability to make significant  improvements in function in a reasonable and predictable amount of time.     Precautions / Restrictions Precautions Precautions: Fall Precaution Comments: monitor O2 sats, BP, HR Restrictions Weight Bearing Restrictions: Yes RLE Weight Bearing: Weight bearing as tolerated      Mobility  Bed Mobility Overal bed mobility: Needs Assistance Bed Mobility: Supine to Sit, Sit to Supine     Supine to sit: Max assist, +2 for physical assistance, +2 for safety/equipment Sit to supine: Max assist, +2 for physical assistance, +2 for safety/equipment   General bed mobility comments: pt initially resisted with trunk and required redirection to get her off the bed    Transfers Overall transfer level: Needs assistance Equipment used: Rolling walker (2 wheels), 2 person hand held assist Transfers: Sit to/from Stand Sit to Stand: Max assist, +2 physical assistance, +2 safety/equipment                Ambulation/Gait               General Gait Details: unable to take a step  Stairs            Wheelchair Mobility    Modified Rankin (Stroke Patients Only)       Balance Overall balance assessment: Needs assistance Sitting-balance support: Feet supported Sitting balance-Leahy Scale: Poor     Standing balance support: Bilateral upper extremity supported, During functional activity Standing balance-Leahy Scale: Poor                               Pertinent Vitals/Pain Pain Assessment Pain Assessment: Faces Faces Pain Scale: Hurts even more Pain Location: R hip during movement  Pain Descriptors / Indicators: Guarding, Grimacing, Burning Pain Intervention(s): Limited activity within patient's tolerance, Monitored during session, Repositioned    Home Living Family/patient expects to be discharged to:: Private residence Living Arrangements: Children Available Help at Discharge: Family;Available 24 hours/day Type of Home: House Home Access: Ramped  entrance       Home Layout: One level Home Equipment: Agricultural consultant (2 wheels);Rollator (4 wheels);Shower seat;BSC/3in1;Grab bars - tub/shower Additional Comments: has been SOB at home but not using O2    Prior Function Prior Level of Function : Needs assist       Physical Assist : Mobility (physical) Mobility (physical): Gait   Mobility Comments: RW for gait with tolerance for short walking distances, able to go alone to BR ADLs Comments: family assist for showers     Hand Dominance   Dominant Hand: Left    Extremity/Trunk Assessment   Upper Extremity Assessment Upper Extremity Assessment: Defer to OT evaluation RUE Deficits / Details: hx of stroke R sided weakness, decreased strength, FM/GM coordination RUE Sensation: history of peripheral neuropathy RUE Coordination: decreased fine motor;decreased gross motor    Lower Extremity Assessment Lower Extremity Assessment: Defer to PT evaluation RLE Deficits / Details: pain and weakness from stroke and new injury RLE: Unable to fully assess due to pain RLE Coordination: decreased gross motor    Cervical / Trunk Assessment Cervical / Trunk Assessment: Kyphotic (mild)  Communication   Communication: Expressive difficulties (mild changes in speech)  Cognition Arousal/Alertness: Awake/alert Behavior During Therapy: WFL for tasks assessed/performed, Agitated Overall Cognitive Status: Difficult to assess                                 General Comments: Pt is struggling with details and recall of recent information and family does not indicate if this is new        General Comments General comments (skin integrity, edema, etc.): pt is reluctant to get up to stand and objects over fears of being unable and risk to fall.  Even with encouragement is exerting a low effort, but with two standing near will power up more with legs    Exercises     Assessment/Plan    PT Assessment Patient needs continued PT  services  PT Problem List Decreased strength;Decreased range of motion;Decreased activity tolerance;Decreased balance;Decreased mobility;Decreased coordination;Decreased cognition;Decreased knowledge of use of DME;Decreased safety awareness;Cardiopulmonary status limiting activity;Pain;Decreased skin integrity       PT Treatment Interventions DME instruction;Gait training;Functional mobility training;Therapeutic activities;Therapeutic exercise;Balance training;Neuromuscular re-education;Patient/family education    PT Goals (Current goals can be found in the Care Plan section)  Acute Rehab PT Goals Patient Stated Goal: to recover to stand and walk PT Goal Formulation: With patient/family Time For Goal Achievement: 06/29/22 Potential to Achieve Goals: Good    Frequency Min 3X/week     Co-evaluation               AM-PAC PT "6 Clicks" Mobility  Outcome Measure Help needed turning from your back to your side while in a flat bed without using bedrails?: Total Help needed moving from lying on your back to sitting on the side of a flat bed without using bedrails?: Total Help needed moving to and from a bed to a chair (including a wheelchair)?: Total Help needed standing up from a chair using your arms (e.g., wheelchair or bedside chair)?: Total Help needed to walk in hospital room?: Total Help needed climbing  3-5 steps with a railing? : Total 6 Click Score: 6    End of Session Equipment Utilized During Treatment: Gait belt Activity Tolerance: Patient limited by fatigue;Patient limited by pain;Treatment limited secondary to medical complications (Comment) Patient left: in bed;with call bell/phone within reach;with bed alarm set;with family/visitor present Nurse Communication: Mobility status;Other (comment) (replace purwick) PT Visit Diagnosis: Unsteadiness on feet (R26.81);Muscle weakness (generalized) (M62.81);Adult, failure to thrive (R62.7);Pain;Hemiplegia and  hemiparesis Hemiplegia - Right/Left: Right Hemiplegia - dominant/non-dominant: Dominant Hemiplegia - caused by: Unspecified Pain - Right/Left: Right Pain - part of body: Hip    Time: 1210-1254 PT Time Calculation (min) (ACUTE ONLY): 44 min   Charges:   PT Evaluation $PT Eval Moderate Complexity: 1 Mod PT Treatments $Therapeutic Activity: 23-37 mins       Ivar Drape 06/15/2022, 5:17 PM  Samul Dada, PT PhD Acute Rehab Dept. Number: [facility] [id number] and [facility] [phone]

## 2022-06-15 NOTE — Progress Notes (Signed)
Progress Note   Patient: Chelsea [name]:[id number] DOB: 1945/01/17 DOA: 06/12/2022     3 DOS: the patient was seen and examined on 06/15/2022 at 11:10AM      Brief hospital course: Mrs. Seawell is a 77 y.o. 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  In the ER, radiograph showed RIGHT hip fracture   6/1: Admitted 6/2: Got peripheral nerve block 6/3: Got IM nail by Dr. Carola Frost, creatinine up to 2.8 6/4: Still orthostatic     Assessment and Plan: * Hip fracture, right, closed, initial encounter (HCC) S/p INTRAMEDULLARY (IM) NAIL INTERTROCHANTERIC, REPAIR OF RIGHT HIP FRACTURE 6/3 by Dr. Carola Frost - PT ordered - WBAT R leg with walker - Unrestricted ROM - Analgesia per ortho - DVT PPx SCDs and Plavix    AKI (acute kidney injury) on CKD IV Cr baseline 2-2.2, today up to 2.8 yesterday morning, down to 2.6 today with fluids - IV fluids today, stop this evening and check BMP tomorrow - Avoid nephrotoxins - Hold olmesartan, torsemide - Avoid hypotension - Hold finerenone, Coreg due to low BP  Hyperkalemia Due to AKI, mild, slightly better today - IV fluids - Ternd BMP  Wheezing Former smoker but no prior dx of CHF or COPD.  Patient was prescribed supplemental oxygen after her stroke many years ago but never uses it at home, family confirmed that she does not use oxygen at home as needed, she is not using alcohol.  Here she is not wheezing, has no dyspnea.  She has no swelling, palpitations, orthopnea, change in breathing from baseline.  I confirmed this twice with family, and I am uncertain what H&P was referring to.  Echo here with grade II DD, normal valves, normal EF.    - Continue ICS/LABA, started here, no need to continue at d/c  PVD (peripheral vascular disease) (HCC) - Continue aspirin, Crestor,  Plavix - Hold Coreg  Fall    Pure hypercholesterolemia - Continue Crestor  History of CVA (cerebrovascular accident) - Continue aspirin, Plavix - Trend CBC - Continue Crestor  Malnutrition of moderate degree As evidenced by loss of subcutaneous muscle mass and fat diffusely  CKD (chronic kidney disease), stage IV (HCC) Cr baseline 2.0-2.2, see above  - Hold finerenone until BP improves  Essential hypertension BP low post-op, improved with fluids. Orthostatic again today. - Repeat fluids - Hold Coreg, finerenone - Hold ARB due to AKI - Hold torsemide   Controlled type 2 diabetes mellitus with hyperglycemia, without long-term current use of insulin (HCC) A1c 8.1%.  Diet controlled at baseline. Glucoses fairly good here. - Continue SS corrections          Subjective: Patient feels tired and weak, but her pain is well-controlled, she has no confusion, she has had no respiratory symptoms chest pain, or bleeding.  No nursing concerns.  Get orthostatic with PT, calcium and fluids.     Physical Exam: BP (!) 128/48 (BP Location: Left Arm)   Pulse 71   Temp 98 F (36.7 C) (Oral)   Resp 18   Ht 5\' 7"  (1.702 m)   Wt 69.4 kg   SpO2 99%   BMI 23.96 kg/m   Elderly adult female, lying in bed, appears weak and tired RRR, no murmurs, no pitting peripheral edema, no JVD RRR normal, lungs clear without rales despite  overall diminished Abdomen soft without tenderness palpation or guarding Right hip dressings appears clean dry and intact Hard of hearing, face symmetric, speech fluent, generalized weakness but symmetric strength, mild tremor    Data Reviewed: Discussed with orthopedics team Basic metabolic panel shows creatinine down to 2.6, hemoglobin slightly down to 9.1 Potassium at 5.2, glucose mostly controlled  Family Communication: Daughter at the bedside    Disposition: Status is: Inpatient Patient was admitted for hip fracture  She has advanced renal  disease and has had an AKI here  So far her hemoglobin is stable, we will give more fluids given she is orthostatic today, we will monitor fluid status and hemodynamics carefully  Hopefully to SNF by the end of the week, she would benefit from slower more gradual rehabilitation in my medical opinion and is not a good candidate for intense acute rehab in an inpatient setting.        Author: Alberteen Sam, MD 06/15/2022 5:25 PM  For on call review [email or web address].

## 2022-06-15 NOTE — Evaluation (Signed)
Occupational Therapy Evaluation Patient Details Name: Chelsea [name] MRN: [id number] DOB: 04-Feb-1945 Today's Date: 06/15/2022   History of Present Illness 77 yo female with onset of mechanical fall was admitted 6/1 for management. Had R intertrochanteric fracture with IM nailing done 6/3.  Has been walking only short trips at baseline, SOB with pt requiring HOB elevated at night. PMHx:  bronchitis, Stroke, R hemiparesis, LBBB, COPD, carotid stenosis, HTN, MI, L eye blind, DM, R hippocampus infarct   Clinical Impression   Pt s/p above diagnosis. Pt has hx of strokes, R sided weakness, was able to use RW around home, mod I for ADLs, assist only for bathing. Pt currently total A for bed mobility, LB dressing/bathing, difficulty sitting on EOB. Pt lives at home with daughter who is not able to physically assist. Pt would benefit greatly from skilled therapy to improve functional strength, and from post acute follow up < 3hrs/day.      Recommendations for follow up therapy are one component of a multi-disciplinary discharge planning process, led by the attending physician.  Recommendations may be updated based on patient status, additional functional criteria and insurance authorization.   Assistance Recommended at Discharge Frequent or constant Supervision/Assistance  Patient can return home with the following A lot of help with walking and/or transfers;A lot of help with bathing/dressing/bathroom;Assistance with cooking/housework;Direct supervision/assist for medications management;Assist for transportation;Help with stairs or ramp for entrance    Functional Status Assessment  Patient has had a recent decline in their functional status and demonstrates the ability to make significant improvements in function in a reasonable and predictable amount of time.  Equipment Recommendations  Other (comment) (defer)    Recommendations for Other Services       Precautions / Restrictions  Precautions Precautions: Fall Precaution Comments: monitor O2 sats, BP, HR Restrictions Weight Bearing Restrictions: Yes RLE Weight Bearing: Weight bearing as tolerated      Mobility Bed Mobility Overal bed mobility: Needs Assistance Bed Mobility: Supine to Sit, Sit to Supine     Supine to sit: Total assist, +2 for physical assistance, HOB elevated Sit to supine: Total assist, +2 for physical assistance   General bed mobility comments: total, unable to lift legs or turn/roll to participate    Transfers Overall transfer level: Needs assistance                 General transfer comment: unable to currently      Balance Overall balance assessment: Needs assistance Sitting-balance support: Feet supported Sitting balance-Leahy Scale: Poor Sitting balance - Comments: sitting EOB   Standing balance support:  (unable) Standing balance-Leahy Scale: Zero Standing balance comment: unable                           ADL either performed or assessed with clinical judgement   ADL Overall ADL's : Needs assistance/impaired Eating/Feeding: Set up   Grooming: Minimal assistance;Bed level   Upper Body Bathing: Moderate assistance;Sitting   Lower Body Bathing: Total assistance;Sitting/lateral leans;+2 for physical assistance   Upper Body Dressing : Minimal assistance;Sitting   Lower Body Dressing: Total assistance;Sitting/lateral leans;+2 for physical assistance   Toilet Transfer: Total assistance;+2 for physical assistance   Toileting- Clothing Manipulation and Hygiene: Maximal assistance;Sitting/lateral lean       Functional mobility during ADLs: Total assistance;+2 for physical assistance General ADL Comments: Pt currently able to sit on EOB, unable to stand with assistance x1 and elevated surface. Pt total for LB ADLs.  Vision Baseline Vision/History: 1 Wears glasses;4 Cataracts Ability to See in Adequate Light: 1 Impaired Patient Visual Report: No  change from baseline       Perception     Praxis      Pertinent Vitals/Pain Pain Assessment Pain Assessment: 0-10 Pain Score: 6  Faces Pain Scale: Hurts even more Pain Location: R hip during activities Pain Descriptors / Indicators: Guarding, Grimacing, Burning Pain Intervention(s): Limited activity within patient's tolerance     Hand Dominance Left   Extremity/Trunk Assessment Upper Extremity Assessment Upper Extremity Assessment: Generalized weakness;RUE deficits/detail RUE Deficits / Details: hx of stroke R sided weakness, decreased strength, FM/GM coordination RUE Sensation: history of peripheral neuropathy RUE Coordination: decreased fine motor;decreased gross motor   Lower Extremity Assessment Lower Extremity Assessment: Defer to PT evaluation RLE Deficits / Details: pain and weakness from stroke and new injury       Communication Communication Communication: Expressive difficulties   Cognition Arousal/Alertness: Awake/alert Behavior During Therapy: WFL for tasks assessed/performed Overall Cognitive Status: Difficult to assess                                 General Comments: Pt HOH, occassional comments displaying disorientation to situation     General Comments       Exercises     Shoulder Instructions      Home Living Family/patient expects to be discharged to:: Private residence Living Arrangements: Children Available Help at Discharge: Family;Available 24 hours/day Type of Home: House Home Access: Ramped entrance     Home Layout: One level     Bathroom Shower/Tub: Tub only   Firefighter: Standard     Home Equipment: Agricultural consultant (2 wheels);Rollator (4 wheels);Shower seat;BSC/3in1;Grab bars - tub/shower   Additional Comments: has been SOB at home but not using O2      Prior Functioning/Environment Prior Level of Function : Needs assist       Physical Assist : Mobility (physical) Mobility (physical): Gait    Mobility Comments: RW for gait with tolerance for short walking distances, able to go alone to BR ADLs Comments: family assist for showers        OT Problem List: Decreased strength;Decreased range of motion;Decreased activity tolerance;Impaired balance (sitting and/or standing);Impaired vision/perception;Decreased safety awareness;Impaired UE functional use;Pain      OT Treatment/Interventions: Self-care/ADL training;Therapeutic exercise;Energy conservation;DME and/or AE instruction;Therapeutic activities    OT Goals(Current goals can be found in the care plan section) Acute Rehab OT Goals Patient Stated Goal: To improve strength through rehab and return home OT Goal Formulation: With patient/family Time For Goal Achievement: 06/29/22 Potential to Achieve Goals: Good  OT Frequency: Min 2X/week    Co-evaluation              AM-PAC OT "6 Clicks" Daily Activity     Outcome Measure Help from another person eating meals?: A Little Help from another person taking care of personal grooming?: A Little Help from another person toileting, which includes using toliet, bedpan, or urinal?: A Lot Help from another person bathing (including washing, rinsing, drying)?: A Lot Help from another person to put on and taking off regular upper body clothing?: A Lot Help from another person to put on and taking off regular lower body clothing?: Total 6 Click Score: 13   End of Session Equipment Utilized During Treatment: Gait belt;Rolling walker (2 wheels) Nurse Communication: Mobility status  Activity Tolerance: Patient limited by pain;Patient limited  by lethargy Patient left: in bed;with call bell/phone within reach;with family/visitor present  OT Visit Diagnosis: Unsteadiness on feet (R26.81);Other abnormalities of gait and mobility (R26.89);Muscle weakness (generalized) (M62.81);History of falling (Z91.81);Pain Pain - Right/Left: Right Pain - part of body: Hip                Time:  1455-1535 OT Time Calculation (min): 40 min Charges:  OT General Charges $OT Visit: 1 Visit OT Evaluation $OT Eval High Complexity: 1 High OT Treatments $Self Care/Home Management : 8-22 mins $Therapeutic Activity: 8-22 mins  Herminio Kniskern, OTR/L   Alexis Goodell 06/15/2022, 3:48 PM

## 2022-06-16 ENCOUNTER — Inpatient Hospital Stay (HOSPITAL_COMMUNITY): Payer: PPO

## 2022-06-16 DIAGNOSIS — S72001A Fracture of unspecified part of neck of right femur, initial encounter for closed fracture: Secondary | ICD-10-CM | POA: Diagnosis not present

## 2022-06-16 LAB — CBC
HCT: 24.1 % — ABNORMAL LOW (ref 36.0–46.0)
Hemoglobin: 7.6 g/dL — ABNORMAL LOW (ref 12.0–15.0)
MCH: 31.3 pg (ref 26.0–34.0)
MCHC: 31.5 g/dL (ref 30.0–36.0)
MCV: 99.2 fL (ref 80.0–100.0)
Platelets: 181 10*3/uL (ref 150–400)
RBC: 2.43 MIL/uL — ABNORMAL LOW (ref 3.87–5.11)
RDW: 12.4 % (ref 11.5–15.5)
WBC: 8 10*3/uL (ref 4.0–10.5)
nRBC: 0 % (ref 0.0–0.2)

## 2022-06-16 LAB — BASIC METABOLIC PANEL
Anion gap: 6 (ref 5–15)
BUN: 48 mg/dL — ABNORMAL HIGH (ref 8–23)
CO2: 22 mmol/L (ref 22–32)
Calcium: 8 mg/dL — ABNORMAL LOW (ref 8.9–10.3)
Chloride: 106 mmol/L (ref 98–111)
Creatinine, Ser: 2.36 mg/dL — ABNORMAL HIGH (ref 0.44–1.00)
GFR, Estimated: 21 mL/min — ABNORMAL LOW (ref >60)
Glucose, Bld: 169 mg/dL — ABNORMAL HIGH (ref 70–99)
Potassium: 5.2 mmol/L — ABNORMAL HIGH (ref 3.5–5.1)
Sodium: 134 mmol/L — ABNORMAL LOW (ref 135–145)

## 2022-06-16 LAB — GLUCOSE, CAPILLARY
Glucose-Capillary: 135 mg/dL — ABNORMAL HIGH (ref 70–99)
Glucose-Capillary: 179 mg/dL — ABNORMAL HIGH (ref 70–99)
Glucose-Capillary: 132 mg/dL — ABNORMAL HIGH (ref 70–99)
Glucose-Capillary: 115 mg/dL — ABNORMAL HIGH (ref 70–99)

## 2022-06-16 LAB — HEMOGLOBIN AND HEMATOCRIT, BLOOD
HCT: 23.8 % — ABNORMAL LOW (ref 36.0–46.0)
Hemoglobin: 7.4 g/dL — ABNORMAL LOW (ref 12.0–15.0)

## 2022-06-16 LAB — POTASSIUM: Potassium: 5 mmol/L (ref 3.5–5.1)

## 2022-06-16 NOTE — Progress Notes (Signed)
Physical Therapy Treatment Patient Details Name: Chelsea [name] MRN: [id number] DOB: March 24, 1945 Today's Date: 06/16/2022   History of Present Illness 77 yo female with onset of mechanical fall was admitted 6/1 for management. Had R intertrochanteric fracture with IM nailing done 6/3.  Has been walking only short trips at baseline, SOB with pt requiring HOB elevated at night. PMHx:  bronchitis, Stroke, R hemiparesis, LBBB, COPD, carotid stenosis, HTN, MI, L eye blind, DM, R hippocampus infarct    PT Comments    Pt was seen initially asking to use BSC for bowel movement, but after transition to chair did not have  energy to  do more.  Pt was pivoted directly by PT as she is not able to complete the task of making transition to [facility] on walker, both from understanding and energy for the task.  Lengthy conversation with family later about her weakness and low intake along with difficulty in getting her energy to be enough to sit up in chair.  Contacted medical team for discussion with them about expectations with this pt and recovery of her PLOF.  Will have her attempt to sit in a recliner if able, but now is up and back to bed with poor tolerance for mobility.  Have adjusted discharge plan to reflect her level, will reassess after next visit.  Recommendations for follow up therapy are one component of a multi-disciplinary discharge planning process, led by the attending physician.  Recommendations may be updated based on patient status, additional functional criteria and insurance authorization.  Follow Up Recommendations  Can patient physically be transported by private vehicle: No    Assistance Recommended at Discharge Frequent or constant Supervision/Assistance  Patient can return home with the following Two people to help with walking and/or transfers;Two people to help with bathing/dressing/bathroom;Assistance with cooking/housework;Assistance with feeding;Assist for transportation   Equipment  Recommendations  Other (comment) (TBD)    Recommendations for Other Services       Precautions / Restrictions Precautions Precautions: Fall Precaution Comments: monitor O2 sats, BP, HR Restrictions Weight Bearing Restrictions: Yes RLE Weight Bearing: Weight bearing as tolerated     Mobility  Bed Mobility Overal bed mobility: Needs Assistance Bed Mobility: Supine to Sit, Sit to Supine     Supine to sit: Max assist, +2 for physical assistance, +2 for safety/equipment Sit to supine: Max assist, +2 for physical assistance, +2 for safety/equipment   General bed mobility comments: assisted her to get up and to side of bed but pt is more willing to let PT help her    Transfers Overall transfer level: Needs assistance Equipment used: Rolling walker (2 wheels), 1 person hand held assist Transfers: Sit to/from Stand, Bed to chair/wheelchair/BSC Sit to Stand: Max assist Stand pivot transfers: Max assist         General transfer comment: pt was seen for progression of therapy to [facility] but is too fatigued from sitting on chair to get to recliner after, asking to return to bed    Ambulation/Gait               General Gait Details: unable to step   Stairs             Wheelchair Mobility    Modified Rankin (Stroke Patients Only)       Balance Overall balance assessment: Needs assistance Sitting-balance support: Feet supported Sitting balance-Leahy Scale: Poor       Standing balance-Leahy Scale: Poor  Cognition Arousal/Alertness: Lethargic Behavior During Therapy: Flat affect Overall Cognitive Status: Difficult to assess                                 General Comments: pt is weaker and family shares intake is low        Exercises      General Comments General comments (skin integrity, edema, etc.): Pt is very weak and concerning to family so contacted medical team to talk with them about her  intake and her expectations for rehab progression      Pertinent Vitals/Pain Pain Assessment Pain Assessment: Faces Faces Pain Scale: Hurts even more Pain Location: R hip during movement Pain Descriptors / Indicators: Operative site guarding, Tender Pain Intervention(s): Limited activity within patient's tolerance, Monitored during session, Premedicated before session, Repositioned, Other (comment), Patient requesting pain meds-RN notified    Home Living                          Prior Function            PT Goals (current goals can now be found in the care plan section) Acute Rehab PT Goals Patient Stated Goal: to get on bsc Progress towards PT goals: Not progressing toward goals - comment    Frequency    Min 3X/week      PT Plan Other (comment);Discharge plan needs to be updated ([facility] has currently declined pt)    Co-evaluation              AM-PAC PT "6 Clicks" Mobility   Outcome Measure  Help needed turning from your back to your side while in a flat bed without using bedrails?: Total Help needed moving from lying on your back to sitting on the side of a flat bed without using bedrails?: Total Help needed moving to and from a bed to a chair (including a wheelchair)?: Total Help needed standing up from a chair using your arms (e.g., wheelchair or bedside chair)?: Total Help needed to walk in hospital room?: Total Help needed climbing 3-5 steps with a railing? : Total 6 Click Score: 6    End of Session Equipment Utilized During Treatment: Gait belt Activity Tolerance: Patient limited by fatigue;Treatment limited secondary to medical complications (Comment) Patient left: in bed;with call bell/phone within reach;with bed alarm set;with family/visitor present Nurse Communication: Mobility status;Other (comment) PT Visit Diagnosis: Unsteadiness on feet (R26.81);Muscle weakness (generalized) (M62.81);Adult, failure to thrive (R62.7);Pain;Hemiplegia and  hemiparesis Hemiplegia - Right/Left: Right Hemiplegia - dominant/non-dominant: Dominant Hemiplegia - caused by: Unspecified Pain - Right/Left: Right Pain - part of body: Hip     Time: 1610-9604 PT Time Calculation (min) (ACUTE ONLY): 32 min  Charges:  $Therapeutic Activity: 23-37 mins        Ivar Drape 06/16/2022, 6:56 PM  Samul Dada, PT PhD Acute Rehab Dept. Number: [facility] [id number] and [facility] [phone]

## 2022-06-16 NOTE — TOC Initial Note (Addendum)
Transition of Care [facility]) - Initial/Assessment Note    Patient Details  Name: Chelsea [name] MRN: [id number] Date of Birth: [birth year]  [facility]) CM/SW Contact:    Lorri Frederick, LCSW Phone Number: 06/16/2022, 10:28 AM  Clinical Narrative:      CSW met with pt and daughters Smith Robert and [name] to discuss DC recommendation for SNF.  Permission given to talk with daughters.  Pt initially recommended for [facility], discussed that [facility] has assessed and will not be able to admit.     They are agreeable to SNF as DC plan, first choice would be [facility], would like to be in [facility] area if possible.  Medicare choice document provided.  Pt lives at home with Smith Robert, no current services.        Referral sent out in hub for SNF.  CSW reached out to [facility] to review.   1450: [facility] does offer, pt and daughter accept.   Auth request made to [facility].    Barriers to Discharge: Continued Medical Work up, SNF Pending bed offer   Patient Goals and CMS Choice Patient states their goals for this hospitalization and ongoing recovery are:: drive again (daughters Laren Boom and [name].) CMS Medicare.gov Compare Post Acute Care list provided to:: Patient Represenative (must comment) Choice offered to / list presented to : Adult Children      Expected Discharge Plan and Services In-house Referral: Clinical Social Work   Post Acute Care Choice: Skilled Nursing Facility Living arrangements for the past 2 months: Single Family Home                                      Prior Living Arrangements/Services Living arrangements for the past 2 months: Single Family Home Lives with:: Adult Children (with daughter Laren Boom) Patient language and need for interpreter reviewed:: Yes Do you feel safe going back to the place where you live?: Yes      Need for Family Participation in Patient Care: Yes (Comment) Care giver support system in place?: Yes (comment) Current  home services: Other (comment) (none) Criminal Activity/Legal Involvement Pertinent to Current Situation/Hospitalization: No - Comment as needed  Activities of Daily Living Home Assistive Devices/Equipment: Walker (specify type) ADL Screening (condition at time of admission) Patient's cognitive ability adequate to safely complete daily activities?: Yes Is the patient deaf or have difficulty hearing?: No Does the patient have difficulty seeing, even when wearing glasses/contacts?: No Does the patient have difficulty concentrating, remembering, or making decisions?: No Patient able to express need for assistance with ADLs?: Yes Does the patient have difficulty dressing or bathing?: Yes Independently performs ADLs?: No Communication: Independent Dressing (OT): Needs assistance Is this a change from baseline?: Pre-admission baseline Grooming: Independent Feeding: Independent Bathing: Needs assistance Is this a change from baseline?: Pre-admission baseline Toileting: Independent In/Out Bed: Needs assistance Is this a change from baseline?: Pre-admission baseline Walks in Home: Independent with device (comment) Does the patient have difficulty walking or climbing stairs?: Yes Weakness of Legs: Right Weakness of Arms/Hands: Right  Permission Sought/Granted Permission sought to share information with : Family Supports Permission granted to share information with : Yes, Verbal Permission Granted  Share Information with NAME: daughters Laren Boom and [name]  Permission granted to share info w AGENCY: SNF        Emotional Assessment Appearance:: Appears stated age Attitude/Demeanor/Rapport: Lethargic Affect (typically observed): Pleasant Orientation: : Oriented to  Self, Oriented to Place, Oriented to  Time, Oriented to Situation      Admission diagnosis:  Hip fracture (HCC) [S72.009A] Fall [W19.XXXA] Closed nondisplaced intertrochanteric fracture of right femur, initial encounter (HCC)  [S72.144A] Patient Active Problem List   Diagnosis Date Noted   Hyperkalemia 06/14/2022   PVD (peripheral vascular disease) (HCC) 06/13/2022   Wheezing 06/13/2022   Hip fracture, right, closed, initial encounter (HCC) 06/12/2022   Fall 06/12/2022   Hyponatremia 10/07/2020   AKI (acute kidney injury) on CKD IV 10/07/2020   Acute kidney injury superimposed on CKD (HCC) 10/07/2020   Dehydration with hyponatremia    Syncope 10/06/2020   History of CVA (cerebrovascular accident) 07/10/2020   Pure hypercholesterolemia 07/10/2020   Type 2 diabetes mellitus with complication, without long-term current use of insulin (HCC) 07/10/2020   Malnutrition of moderate degree 06/27/2020   CKD (chronic kidney disease), stage IV (HCC) 06/25/2020   CAP (community acquired pneumonia) 06/25/2020   Stenosis of left carotid artery 12/03/2019   Stroke [facility])    Pain    Acute ischemic right posterior cerebral artery (PCA) stroke (HCC) 11/09/2019   Essential hypertension    Controlled type 2 diabetes mellitus with hyperglycemia (HCC)    Postherpetic neuralgia    Controlled type 2 diabetes mellitus with hyperglycemia, without long-term current use of insulin (HCC) 11/07/2019   Hypertensive urgency 11/07/2019   Stenosis of left internal carotid artery 11/07/2019   Intractable nausea and vomiting 11/07/2019   Nicotine dependence, cigarettes, uncomplicated 11/07/2019   HZV (herpes zoster virus) post herpetic neuralgia 11/07/2019   Weakness of right lower extremity 11/07/2019   CVA (cerebral vascular accident) (HCC) 11/07/2019   PCP:  Trisha Mangle, FNP Pharmacy:   [facility] [place], [place] - [address] [place] [address] Phone: [phone] Fax: [phone]     Social Determinants of Health (SDOH) Social History: SDOH Screenings   Food Insecurity: No Food Insecurity (06/13/2022)  Housing: Patient Declined (06/13/2022)  Transportation Needs: No  Transportation Needs (06/13/2022)  Utilities: Not At Risk (06/13/2022)  Depression (PHQ2-9): High Risk (09/29/2020)  Tobacco Use: Medium Risk (06/15/2022)   SDOH Interventions:     Readmission Risk Interventions    06/27/2020    2:08 PM 12/05/2019    1:40 PM  Readmission Risk Prevention Plan  Transportation Screening Complete Complete  PCP or Specialist Appt within 3-5 Days  Complete  HRI or Home Care Consult Complete Complete  Social Work Consult for Recovery Care Planning/Counseling Complete Complete  Palliative Care Screening Not Applicable Not Applicable  Medication Review Oceanographer)  Complete

## 2022-06-16 NOTE — NC FL2 (Signed)
[place] MEDICAID [facility] LEVEL OF CARE FORM     IDENTIFICATION  Patient Name: Chelsea [name] Birthdate: 05/11/1945 Sex: female Admission Date (Current Location): 06/12/2022  [facility] and [place] Number:  Producer, television/film/video and Address:  [place], [address] [place], [place] [address]      Provider Number: [id number]  Attending Physician Name and Address:  Willeen Niece, MD  Relative Name and Phone Number:  Geanie Cooley Daughter [phone]    Current Level of Care: Hospital Recommended Level of Care: Skilled Nursing Facility Prior Approval Number:    Date Approved/Denied:   PASRR Number: [id number]  Discharge Plan: SNF    Current Diagnoses: Patient Active Problem List   Diagnosis Date Noted   Hyperkalemia 06/14/2022   PVD (peripheral vascular disease) (HCC) 06/13/2022   Wheezing 06/13/2022   Hip fracture, right, closed, initial encounter (HCC) 06/12/2022   Fall 06/12/2022   Hyponatremia 10/07/2020   AKI (acute kidney injury) on CKD IV 10/07/2020   Acute kidney injury superimposed on CKD (HCC) 10/07/2020   Dehydration with hyponatremia    Syncope 10/06/2020   History of CVA (cerebrovascular accident) 07/10/2020   Pure hypercholesterolemia 07/10/2020   Type 2 diabetes mellitus with complication, without long-term current use of insulin (HCC) 07/10/2020   Malnutrition of moderate degree 06/27/2020   CKD (chronic kidney disease), stage IV (HCC) 06/25/2020   CAP (community acquired pneumonia) 06/25/2020   Stenosis of left carotid artery 12/03/2019   Stroke [facility])    Pain    Acute ischemic right posterior cerebral artery (PCA) stroke (HCC) 11/09/2019   Essential hypertension    Controlled type 2 diabetes mellitus with hyperglycemia (HCC)    Postherpetic neuralgia    Controlled type 2 diabetes mellitus with hyperglycemia, without long-term current use of insulin (HCC) 11/07/2019   Hypertensive urgency 11/07/2019   Stenosis of left  internal carotid artery 11/07/2019   Intractable nausea and vomiting 11/07/2019   Nicotine dependence, cigarettes, uncomplicated 11/07/2019   HZV (herpes zoster virus) post herpetic neuralgia 11/07/2019   Weakness of right lower extremity 11/07/2019   CVA (cerebral vascular accident) (HCC) 11/07/2019    Orientation RESPIRATION BLADDER Height & Weight     Self, Time, Situation, Place  O2 Incontinent Weight: 153 lb (69.4 kg) Height:  5\' 7"  (170.2 cm)  BEHAVIORAL SYMPTOMS/MOOD NEUROLOGICAL BOWEL NUTRITION STATUS      Continent    AMBULATORY STATUS COMMUNICATION OF NEEDS Skin   Total Care Verbally Surgical wounds, Other (Comment) (ecchymosis)                       Personal Care Assistance Level of Assistance  Bathing, Feeding, Dressing, Total care Bathing Assistance: Maximum assistance Feeding assistance: Limited assistance Dressing Assistance: Maximum assistance Total Care Assistance: Maximum assistance   Functional Limitations Info  Sight, Hearing, Speech Sight Info: Adequate Hearing Info: Adequate Speech Info: Adequate    SPECIAL CARE FACTORS FREQUENCY  PT (By licensed PT), OT (By licensed OT)     PT Frequency: 5x week OT Frequency: 5x week            Contractures Contractures Info: Not present    Additional Factors Info  Code Status, Allergies, Insulin Sliding Scale Code Status Info: DNR Allergies Info: Codeine, Latex, Penicillins, Sulfa Antibiotics, Benzodiazepines, Cephalosporins, Lidocaine, Oxycodone-acetaminophen, Trazodone, Atorvastatin, Cephalexin, Gabapentin, Pregabalin   Insulin Sliding Scale Info: Novolog: see discharge summary       Current Medications (06/16/2022):  This is the current  hospital active medication list Current Facility-Administered Medications  Medication Dose Route Frequency Provider Last Rate Last Admin   acetaminophen (TYLENOL) tablet 325-650 mg  325-650 mg Oral Q6H PRN Montez Morita, PA-C       acetaminophen (TYLENOL) tablet 650  mg  650 mg Oral Q6H Montez Morita, PA-C   650 mg at 06/16/22 0505   albuterol (PROVENTIL) (2.5 MG/3ML) 0.083% nebulizer solution 2.5 mg  2.5 mg Nebulization Q6H PRN Montez Morita, PA-C       aspirin EC tablet 81 mg  81 mg Oral Daily Montez Morita, PA-C   81 mg at 06/16/22 1610   bisacodyl (DULCOLAX) EC tablet 5 mg  5 mg Oral Daily PRN Montez Morita, PA-C       budesonide (PULMICORT) nebulizer solution 0.25 mg  0.25 mg Nebulization BID Montez Morita, PA-C   0.25 mg at 06/16/22 0701   cholecalciferol (VITAMIN D3) 25 MCG (1000 UNIT) tablet 2,000 Units  2,000 Units Oral Daily Montez Morita, PA-C   2,000 Units at 06/16/22 9604   clopidogrel (PLAVIX) tablet 75 mg  75 mg Oral Daily Mosetta Anis, RPH   75 mg at 06/16/22 5409   docusate sodium (COLACE) capsule 100 mg  100 mg Oral BID Montez Morita, PA-C   100 mg at 06/16/22 8119   feeding supplement (GLUCERNA SHAKE) (GLUCERNA SHAKE) liquid 237 mL  237 mL Oral TID BM Danford, Earl Lites, MD   237 mL at 06/15/22 1325   fluticasone furoate-vilanterol (BREO ELLIPTA) 100-25 MCG/ACT 1 puff  1 puff Inhalation Daily Montez Morita, PA-C   1 puff at 06/16/22 0700   guaiFENesin (MUCINEX) 12 hr tablet 600 mg  600 mg Oral BID Montez Morita, PA-C   600 mg at 06/16/22 1478   HYDROcodone-acetaminophen (NORCO/VICODIN) 5-325 MG per tablet 1-2 tablet  1-2 tablet Oral Q6H PRN Montez Morita, PA-C   1 tablet at 06/16/22 2956   HYDROmorphone (DILAUDID) injection 0.5 mg  0.5 mg Intravenous Q2H PRN Montez Morita, PA-C   0.5 mg at 06/16/22 0002   insulin aspart (novoLOG) injection 0-9 Units  0-9 Units Subcutaneous TID WC Montez Morita, PA-C   1 Units at 06/16/22 0818   ipratropium-albuterol (DUONEB) 0.5-2.5 (3) MG/3ML nebulizer solution 3 mL  3 mL Nebulization BID Montez Morita, PA-C   3 mL at 06/16/22 0701   menthol-cetylpyridinium (CEPACOL) lozenge 3 mg  1 lozenge Oral PRN Montez Morita, PA-C       Or   phenol (CHLORASEPTIC) mouth spray 1 spray  1 spray Mouth/Throat PRN Montez Morita, PA-C        metoCLOPramide (REGLAN) tablet 5-10 mg  5-10 mg Oral Q8H PRN Montez Morita, PA-C       Or   metoCLOPramide (REGLAN) injection 5-10 mg  5-10 mg Intravenous Q8H PRN Montez Morita, PA-C       multivitamin with minerals tablet 1 tablet  1 tablet Oral Daily Danford, Earl Lites, MD   1 tablet at 06/16/22 0820   mupirocin ointment (BACTROBAN) 2 % 1 Application  1 Application Nasal BID Montez Morita, PA-C   1 Application at 06/16/22 0821   ondansetron (ZOFRAN) tablet 4 mg  4 mg Oral Q6H PRN Montez Morita, PA-C       Or   ondansetron [facility]) injection 4 mg  4 mg Intravenous Q6H PRN Montez Morita, PA-C   4 mg at 06/15/22 2122   rosuvastatin (CRESTOR) tablet 5 mg  5 mg Oral Daily Montez Morita, PA-C   5 mg at 06/16/22  0820   senna-docusate (Senokot-S) tablet 1 tablet  1 tablet Oral QHS PRN Montez Morita, PA-C       sodium bicarbonate tablet 650 mg  650 mg Oral Daily Montez Morita, PA-C   650 mg at 06/16/22 4782   tamsulosin (FLOMAX) capsule 0.4 mg  0.4 mg Oral QPM Montez Morita, PA-C   0.4 mg at 06/15/22 1756     Discharge Medications: Please see discharge summary for a list of discharge medications.  Relevant Imaging Results:  Relevant Lab Results:   Additional Information SSN: [id number]  Lorri Frederick, LCSW

## 2022-06-16 NOTE — Progress Notes (Signed)
PROGRESS NOTE    TACOMA COUSER  ZOX:[id number] DOB: [birth year] DOA: 06/12/2022  PCP: Trisha Mangle, FNP   Brief Narrative:  This 77 yrs old Female with DM, hx. CVA with residual vision impairment and Right sided hemiparesis, uses walker at baseline, lives at home, HLD, CKD IV with baseline creatinine 1.9-2.1, PVD s/p CEA 2021, and uncontrolled HTN who presented with mechanical fall. In the ER, radiograph showed RIGHT hip fracture.  Orthopedic consulted.  Patient underwent IM nail by Dr. Sherilyn Cooter.  Creatinine spiked to 2.8 postoperatively.  Started on IV hydration.  PT and OT recommended CIR which is denied,  awaiting SNF placement  Assessment & Plan:   Principal Problem:   Hip fracture, right, closed, initial encounter (HCC) Active Problems:   AKI (acute kidney injury) on CKD IV   Controlled type 2 diabetes mellitus with hyperglycemia, without long-term current use of insulin (HCC)   Essential hypertension   CKD (chronic kidney disease), stage IV (HCC)   Malnutrition of moderate degree   History of CVA (cerebrovascular accident)   Pure hypercholesterolemia   Fall   PVD (peripheral vascular disease) (HCC)   Wheezing   Hyperkalemia   Right hip fracture, closed: S/p INTRAMEDULLARY (IM) NAIL INTERTROCHANTERIC, REPAIR OF RIGHT HIP FRACTURE 6/3 by Dr. Carola Frost - WBAT R leg with walker. - Unrestricted ROM - Analgesia per ortho - DVT PPx SCDs and Plavix. PT and OT recommended SNF       AKI (acute kidney injury) on CKD IV: Baseline creatinine 2-2.2, spiked up to 2.8  down to 2.36 today with fluids. IV fluid discontinued yesterday. Avoid nephrotoxins Hold olmesartan, torsemide Avoid hypotension Hold finerenone, Coreg due to low BP   Hyperkalemia: Due to AKI, mild, better today. Continue to monitor serum creatinine, potassium   Wheezing: Former smoker but no prior dx. of CHF or COPD. Patient was prescribed supplemental oxygen after her stroke many years ago but never uses  it at home, family confirmed that she does not use oxygen at home as needed, she is not using alcohol.  Echo here with grade II DD, normal valves, normal EF.   Continue ICS/LABA, started here, no need to continue at d/c   PVD (peripheral vascular disease) (HCC) Continue aspirin, Crestor, Plavix Hold Coreg   Fall: PT and OT evaluation.     Pure hypercholesterolemia Continue Crestor   History of CVA (cerebrovascular accident) - Continue aspirin, Plavix - Trend CBC - Continue Crestor   Malnutrition of moderate degree As evidenced by loss of subcutaneous muscle mass and fat diffusely   CKD (chronic kidney disease), stage IV (HCC) Cr baseline 2.0-2.2, see above  - Hold finerenone until BP improves.   Essential hypertension BP low post-op, improved with fluids. Check orthostatic blood pressure. - Repeat fluids - Hold Coreg, finerenone - Hold ARB due to AKI - Hold torsemide    Controlled type 2 diabetes mellitus with hyperglycemia, without long-term current use of insulin (HCC) A1c 8.1%.  Diet controlled at baseline. Glucoses fairly good here. Continue SS correction    DVT prophylaxis: SCDs Code Status: DNR Family Communication: No family at bed side. Disposition Plan:     Status is: Inpatient Remains inpatient appropriate because: Admitted to s/p mechanical fall with right hip fracture s/p ORIF.  Now has developed AKI on CKD.   Renal functions improving.   PT recommended SNF   Consultants:  Orthopeadics  Procedures: ORIF  Antimicrobials:  Anti-infectives (From admission, onward)    Start  Dose/Rate Route Frequency Ordered Stop   06/14/22 1200  ceFAZolin (ANCEF) IVPB 2g/100 mL premix       Note to Pharmacy: Dr. Carola Frost reviewed allergies, wants Ancef to be given   2 g 200 mL/hr over 30 Minutes Intravenous  Once 06/14/22 1147 06/14/22 1304      Subjective: Patient seen and examined at bedside.  Overnight events noted. Patient reports doing better.  Renal  functions are improving.  She reports pain is reasonably controlled with pain medications.  Objective: Vitals:   06/16/22 0100 06/16/22 0452 06/16/22 0701 06/16/22 0757  BP:  119/61  (!) 117/50  Pulse:  77  73  Resp:  19    Temp: 98.9 F (37.2 C) 99.3 F (37.4 C)  97.7 F (36.5 C)  TempSrc: Oral   Oral  SpO2:  100% 96% 97%  Weight:      Height:        Intake/Output Summary (Last 24 hours) at 06/16/2022 1356 Last data filed at 06/16/2022 0600 Gross per 24 hour  Intake 480 ml  Output 350 ml  Net 130 ml   Filed Weights   06/12/22 1340 06/14/22 0934  Weight: 69.4 kg 69.4 kg    Examination:  General exam: Appears calm and comfortable, deconditioned, not in any acute distress. Respiratory system: Clear to auscultation. Respiratory effort normal. RR 13 Cardiovascular system: S1 & S2 heard, RRR. No JVD, murmurs, rubs, gallops or clicks. No pedal edema. Gastrointestinal system: Abdomen is soft, nontender, nondistended, bowel sounds present Central nervous system: Alert and oriented X 3. No focal neurological deficits. Extremities: Right hip tenderness, stitches noted. Skin: No rashes, lesions or ulcers Psychiatry: Judgement and insight appear normal. Mood & affect appropriate.     Data Reviewed: I have personally reviewed following labs and imaging studies  CBC: Recent Labs  Lab 06/12/22 1417 06/13/22 0212 06/14/22 0246 06/15/22 0331 06/15/22 1316 06/16/22 0308  WBC 14.9* 10.1 8.8 7.6 7.2 8.0  NEUTROABS 12.6*  --   --   --   --   --   HGB 11.0* 10.6* 9.8* 9.1* 8.0* 7.6*  HCT 34.3* 33.8* 30.1* 28.1* 25.4* 24.1*  MCV 95.8 97.7 96.2 98.6 98.1 99.2  PLT 229 225 190 180 183 181   Basic Metabolic Panel: Recent Labs  Lab 06/12/22 1605 06/14/22 0246 06/14/22 1548 06/15/22 0331 06/16/22 0308 06/16/22 0942  NA 137 134* 136 135 134*  --   K 5.0 5.3* 5.4* 5.2* 5.2* 5.0  CL 106 100 103 101 106  --   CO2 21* 25 23 24 22   --   GLUCOSE 168* 212* 213* 204* 169*  --   BUN  47* 58* 52* 54* 48*  --   CREATININE 2.08* 2.80* 2.65* 2.62* 2.36*  --   CALCIUM 8.1* 8.1* 8.5* 8.2* 8.0*  --   MG  --   --   --  2.2  --   --    GFR: Estimated Creatinine Clearance: 19.1 mL/min (A) (by C-G formula based on SCr of 2.36 mg/dL (H)). Liver Function Tests: Recent Labs  Lab 06/15/22 0331  AST 20  ALT 12  ALKPHOS 69  BILITOT 0.7  PROT 5.3*  ALBUMIN 2.8*   No results for input(s): "LIPASE", "AMYLASE" in the last 168 hours. No results for input(s): "AMMONIA" in the last 168 hours. Coagulation Profile: No results for input(s): "INR", "PROTIME" in the last 168 hours. Cardiac Enzymes: No results for input(s): "CKTOTAL", "CKMB", "CKMBINDEX", "TROPONINI" in the last 168 hours.  BNP (last 3 results) No results for input(s): "PROBNP" in the last 8760 hours. HbA1C: No results for input(s): "HGBA1C" in the last 72 hours. CBG: Recent Labs  Lab 06/15/22 1122 06/15/22 1618 06/15/22 2003 06/16/22 0759 06/16/22 1149  GLUCAP 202* 142* 139* 135* 179*   Lipid Profile: No results for input(s): "CHOL", "HDL", "LDLCALC", "TRIG", "CHOLHDL", "LDLDIRECT" in the last 72 hours. Thyroid Function Tests: No results for input(s): "TSH", "T4TOTAL", "FREET4", "T3FREE", "THYROIDAB" in the last 72 hours. Anemia Panel: No results for input(s): "VITAMINB12", "FOLATE", "FERRITIN", "TIBC", "IRON", "RETICCTPCT" in the last 72 hours. Sepsis Labs: No results for input(s): "PROCALCITON", "LATICACIDVEN" in the last 168 hours.  Recent Results (from the past 240 hour(s))  Surgical PCR screen     Status: Abnormal   Collection Time: 06/14/22 12:45 AM   Specimen: Nasal Mucosa; Nasal Swab  Result Value Ref Range Status   MRSA, PCR NEGATIVE NEGATIVE Final   Staphylococcus aureus POSITIVE (A) NEGATIVE Final    Comment: (NOTE) The Xpert SA Assay (FDA approved for NASAL specimens in patients 35 years of age and older), is one component of a comprehensive surveillance program. It is not intended to  diagnose infection nor to guide or monitor treatment. Performed at [facility], [address] [place], [place] [address]     Radiology Studies: DG HIP UNILAT WITH PELVIS 2-3 VIEWS RIGHT  Result Date: 06/14/2022 CLINICAL DATA:  Postop EXAM: DG HIP (WITH OR WITHOUT PELVIS) 3V RIGHT COMPARISON:  Preop x-ray 06/12/2018 FINDINGS: Interval placement of dynamic right hip screw transfixing the intertrochanteric fracture. Expected alignment. Lateral soft tissue gas. Otherwise global osteopenia. Mild joint space loss of the hip joints and sacroiliac joints. Vascular calcifications. Whole pelvis x-rays rotated to the right slightly. Imaging was obtained to aid in treatment. IMPRESSION: Acute surgical changes of dynamic right hip screw placement transfixing the intertrochanteric fracture. Electronically Signed   By: Karen Kays M.D.   On: 06/14/2022 15:40    Scheduled Meds:  acetaminophen  650 mg Oral Q6H   aspirin EC  81 mg Oral Daily   budesonide (PULMICORT) nebulizer solution  0.25 mg Nebulization BID   cholecalciferol  2,000 Units Oral Daily   clopidogrel  75 mg Oral Daily   docusate sodium  100 mg Oral BID   feeding supplement (GLUCERNA SHAKE)  237 mL Oral TID BM   fluticasone furoate-vilanterol  1 puff Inhalation Daily   guaiFENesin  600 mg Oral BID   insulin aspart  0-9 Units Subcutaneous TID WC   ipratropium-albuterol  3 mL Nebulization BID   multivitamin with minerals  1 tablet Oral Daily   mupirocin ointment  1 Application Nasal BID   rosuvastatin  5 mg Oral Daily   sodium bicarbonate  650 mg Oral Daily   tamsulosin  0.4 mg Oral QPM   Continuous Infusions:   LOS: 4 days    Time spent: 50 mins    Willeen Niece, MD [facility]   If 7PM-7AM, please contact night-coverage

## 2022-06-16 NOTE — Progress Notes (Signed)
  Inpatient Rehab Admissions Coordinator :  Per  PT therapy recommendations patient was screened for CIR candidacy by Ottie Glazier RN MSN. OT recommends SNF. Patient is not at a level to tolerate the intensity required to pursue a CIR admit. Recommend other rehab venues to be pursued. We will not place Rehab consult at this time.Please contact me with any questions.  Ottie Glazier RN MSN Admissions Coordinator [phone]

## 2022-06-17 ENCOUNTER — Inpatient Hospital Stay (HOSPITAL_COMMUNITY): Payer: PPO

## 2022-06-17 DIAGNOSIS — S72001A Fracture of unspecified part of neck of right femur, initial encounter for closed fracture: Secondary | ICD-10-CM | POA: Diagnosis not present

## 2022-06-17 LAB — COMPREHENSIVE METABOLIC PANEL
ALT: 9 U/L (ref 0–44)
AST: 15 U/L (ref 15–41)
Albumin: 2.6 g/dL — ABNORMAL LOW (ref 3.5–5.0)
Alkaline Phosphatase: 66 U/L (ref 38–126)
Anion gap: 9 (ref 5–15)
BUN: 48 mg/dL — ABNORMAL HIGH (ref 8–23)
CO2: 20 mmol/L — ABNORMAL LOW (ref 22–32)
Calcium: 8.2 mg/dL — ABNORMAL LOW (ref 8.9–10.3)
Chloride: 104 mmol/L (ref 98–111)
Creatinine, Ser: 2.09 mg/dL — ABNORMAL HIGH (ref 0.44–1.00)
GFR, Estimated: 24 mL/min — ABNORMAL LOW (ref >60)
Glucose, Bld: 142 mg/dL — ABNORMAL HIGH (ref 70–99)
Potassium: 5.3 mmol/L — ABNORMAL HIGH (ref 3.5–5.1)
Sodium: 133 mmol/L — ABNORMAL LOW (ref 135–145)
Total Bilirubin: 0.6 mg/dL (ref 0.3–1.2)
Total Protein: 4.9 g/dL — ABNORMAL LOW (ref 6.5–8.1)

## 2022-06-17 LAB — GLUCOSE, CAPILLARY
Glucose-Capillary: 134 mg/dL — ABNORMAL HIGH (ref 70–99)
Glucose-Capillary: 141 mg/dL — ABNORMAL HIGH (ref 70–99)
Glucose-Capillary: 179 mg/dL — ABNORMAL HIGH (ref 70–99)
Glucose-Capillary: 180 mg/dL — ABNORMAL HIGH (ref 70–99)
Glucose-Capillary: 195 mg/dL — ABNORMAL HIGH (ref 70–99)

## 2022-06-17 LAB — URINALYSIS, ROUTINE W REFLEX MICROSCOPIC
Bilirubin Urine: NEGATIVE
Glucose, UA: NEGATIVE mg/dL
Hgb urine dipstick: NEGATIVE
Ketones, ur: NEGATIVE mg/dL
Leukocytes,Ua: NEGATIVE
Nitrite: NEGATIVE
Protein, ur: NEGATIVE mg/dL
Specific Gravity, Urine: 1.02 (ref 1.005–1.030)
pH: 5 (ref 5.0–8.0)

## 2022-06-17 LAB — CBC WITH DIFFERENTIAL/PLATELET
Abs Immature Granulocytes: 0.02 10*3/uL (ref 0.00–0.07)
Basophils Absolute: 0 10*3/uL (ref 0.0–0.1)
Basophils Relative: 0 %
Eosinophils Absolute: 0.2 10*3/uL (ref 0.0–0.5)
Eosinophils Relative: 3 %
HCT: 24.9 % — ABNORMAL LOW (ref 36.0–46.0)
Hemoglobin: 8 g/dL — ABNORMAL LOW (ref 12.0–15.0)
Immature Granulocytes: 0 %
Lymphocytes Relative: 16 %
Lymphs Abs: 1.2 10*3/uL (ref 0.7–4.0)
MCH: 31.6 pg (ref 26.0–34.0)
MCHC: 32.1 g/dL (ref 30.0–36.0)
MCV: 98.4 fL (ref 80.0–100.0)
Monocytes Absolute: 0.5 10*3/uL (ref 0.1–1.0)
Monocytes Relative: 7 %
Neutro Abs: 5.3 10*3/uL (ref 1.7–7.7)
Neutrophils Relative %: 74 %
Platelets: 197 10*3/uL (ref 150–400)
RBC: 2.53 MIL/uL — ABNORMAL LOW (ref 3.87–5.11)
RDW: 12.5 % (ref 11.5–15.5)
WBC: 7.3 10*3/uL (ref 4.0–10.5)
nRBC: 0 % (ref 0.0–0.2)

## 2022-06-17 LAB — POTASSIUM: Potassium: 5.1 mmol/L (ref 3.5–5.1)

## 2022-06-17 LAB — LACTIC ACID, PLASMA: Lactic Acid, Venous: 0.6 mmol/L (ref 0.5–1.9)

## 2022-06-17 LAB — AMMONIA: Ammonia: 21 umol/L (ref 9–35)

## 2022-06-17 MED ORDER — HYDROCODONE-ACETAMINOPHEN 5-325 MG PO TABS
1.0000 | ORAL_TABLET | Freq: Four times a day (QID) | ORAL | Status: DC | PRN
  Administered 2022-06-17: 1 via ORAL
  Administered 2022-06-17: 0.5 via ORAL
  Administered 2022-06-18 – 2022-06-23 (×4): 1 via ORAL
  Filled 2022-06-17 (×7): qty 1

## 2022-06-17 MED ORDER — BISACODYL 10 MG RE SUPP
10.0000 mg | Freq: Every day | RECTAL | Status: DC | PRN
  Administered 2022-06-18: 10 mg via RECTAL
  Filled 2022-06-17: qty 1

## 2022-06-17 MED ORDER — POLYETHYLENE GLYCOL 3350 17 G PO PACK
17.0000 g | PACK | Freq: Every day | ORAL | Status: DC
  Administered 2022-06-17 – 2022-06-23 (×6): 17 g via ORAL
  Filled 2022-06-17 (×6): qty 1

## 2022-06-17 MED ORDER — CHLORHEXIDINE GLUCONATE CLOTH 2 % EX PADS
6.0000 | MEDICATED_PAD | Freq: Every day | CUTANEOUS | Status: AC
  Administered 2022-06-17 – 2022-06-21 (×5): 6 via TOPICAL

## 2022-06-17 NOTE — Progress Notes (Signed)
Orthopaedic Trauma Service Progress Note  Patient ID: Chelsea [name] MRN: [id number] DOB/AGE: 78/28/1947 78 y.o.  Subjective:  Progressing slowly but doing ok  Ortho issue stable     ROS As above  Objective:   VITALS:   Vitals:   06/16/22 2200 06/17/22 0500 06/17/22 0724 06/17/22 0751  BP: (!) 114/47 (!) 148/59 (!) 133/52   Pulse: 70 80 69   Resp: 17 18 16    Temp: 98.2 F (36.8 C) 98.4 F (36.9 C) 98.6 F (37 C)   TempSrc: Oral Oral    SpO2: 100% 95% 98% 99%  Weight:      Height:        Estimated body mass index is 23.96 kg/m as calculated from the following:   Height as of this encounter: 5\' 7"  (1.702 m).   Weight as of this encounter: 69.4 kg.   Intake/Output      06/05 0701 06/06 0700 06/06 0701 06/07 0700   P.O. 60    Total Intake(mL/kg) 60 (0.9)    Urine (mL/kg/hr) 300 (0.2)    Total Output 300    Net -240         Urine Occurrence 2 x      LABS  Results for orders placed or performed during the hospital encounter of 06/12/22 (from the past 24 hour(s))  Hemoglobin and hematocrit, blood     Status: Abnormal   Collection Time: 06/16/22  1:35 PM  Result Value Ref Range   Hemoglobin 7.4 (L) 12.0 - 15.0 g/dL   HCT 78.2 (L) 95.6 - 21.3 %  Glucose, capillary     Status: Abnormal   Collection Time: 06/16/22  4:09 PM  Result Value Ref Range   Glucose-Capillary 132 (H) 70 - 99 mg/dL  Glucose, capillary     Status: Abnormal   Collection Time: 06/16/22  8:45 PM  Result Value Ref Range   Glucose-Capillary 115 (H) 70 - 99 mg/dL  Glucose, capillary     Status: Abnormal   Collection Time: 06/17/22 12:01 AM  Result Value Ref Range   Glucose-Capillary 134 (H) 70 - 99 mg/dL  Comprehensive metabolic panel     Status: Abnormal   Collection Time: 06/17/22  5:11 AM  Result Value Ref Range   Sodium 133 (L) 135 - 145 mmol/L   Potassium 5.3 (H) 3.5 - 5.1 mmol/L   Chloride 104 98 - 111  mmol/L   CO2 20 (L) 22 - 32 mmol/L   Glucose, Bld 142 (H) 70 - 99 mg/dL   BUN 48 (H) 8 - 23 mg/dL   Creatinine, Ser 0.86 (H) 0.44 - 1.00 mg/dL   Calcium 8.2 (L) 8.9 - 10.3 mg/dL   Total Protein 4.9 (L) 6.5 - 8.1 g/dL   Albumin 2.6 (L) 3.5 - 5.0 g/dL   AST 15 15 - 41 U/L   ALT 9 0 - 44 U/L   Alkaline Phosphatase 66 38 - 126 U/L   Total Bilirubin 0.6 0.3 - 1.2 mg/dL   GFR, Estimated 24 (L) >60 mL/min   Anion gap 9 5 - 15  Ammonia     Status: None   Collection Time: 06/17/22  5:11 AM  Result Value Ref Range   Ammonia 21 9 - 35 umol/L  Lactic acid, plasma     Status: None  Collection Time: 06/17/22  5:11 AM  Result Value Ref Range   Lactic Acid, Venous 0.6 0.5 - 1.9 mmol/L  Glucose, capillary     Status: Abnormal   Collection Time: 06/17/22  7:20 AM  Result Value Ref Range   Glucose-Capillary 141 (H) 70 - 99 mg/dL  Urinalysis, Routine w reflex microscopic -Urine, Clean Catch     Status: None   Collection Time: 06/17/22  8:41 AM  Result Value Ref Range   Color, Urine YELLOW YELLOW   APPearance CLEAR CLEAR   Specific Gravity, Urine 1.020 1.005 - 1.030   pH 5.0 5.0 - 8.0   Glucose, UA NEGATIVE NEGATIVE mg/dL   Hgb urine dipstick NEGATIVE NEGATIVE   Bilirubin Urine NEGATIVE NEGATIVE   Ketones, ur NEGATIVE NEGATIVE mg/dL   Protein, ur NEGATIVE NEGATIVE mg/dL   Nitrite NEGATIVE NEGATIVE   Leukocytes,Ua NEGATIVE NEGATIVE  CBC with Differential/Platelet     Status: Abnormal   Collection Time: 06/17/22  9:41 AM  Result Value Ref Range   WBC 7.3 4.0 - 10.5 K/uL   RBC 2.53 (L) 3.87 - 5.11 MIL/uL   Hemoglobin 8.0 (L) 12.0 - 15.0 g/dL   HCT 16.1 (L) 09.6 - 04.5 %   MCV 98.4 80.0 - 100.0 fL   MCH 31.6 26.0 - 34.0 pg   MCHC 32.1 30.0 - 36.0 g/dL   RDW 40.9 81.1 - 91.4 %   Platelets 197 150 - 400 K/uL   nRBC 0.0 0.0 - 0.2 %   Neutrophils Relative % 74 %   Neutro Abs 5.3 1.7 - 7.7 K/uL   Lymphocytes Relative 16 %   Lymphs Abs 1.2 0.7 - 4.0 K/uL   Monocytes Relative 7 %    Monocytes Absolute 0.5 0.1 - 1.0 K/uL   Eosinophils Relative 3 %   Eosinophils Absolute 0.2 0.0 - 0.5 K/uL   Basophils Relative 0 %   Basophils Absolute 0.0 0.0 - 0.1 K/uL   Immature Granulocytes 0 %   Abs Immature Granulocytes 0.02 0.00 - 0.07 K/uL  Potassium     Status: None   Collection Time: 06/17/22  9:41 AM  Result Value Ref Range   Potassium 5.1 3.5 - 5.1 mmol/L  Glucose, capillary     Status: Abnormal   Collection Time: 06/17/22 11:29 AM  Result Value Ref Range   Glucose-Capillary 195 (H) 70 - 99 mg/dL     PHYSICAL EXAM:   Gen: in bed, NAD, pleasant, family present. More awake today  Lungs: unlabored  Cardiac: reg pulse  Ext:       Right Lower Extremity              Dressings clean, dry and intact             Ext warm              + dp pulse             No DCT             No pitting edema             Swelling minimal              EHL, FHL, lesser toe motor intact             Ankle flexion, extension, inversion and eversion intact             + Quad set  Good perfusion distally   Assessment/Plan: 3 Days Post-Op    Anti-infectives (From admission, onward)    Start     Dose/Rate Route Frequency Ordered Stop   06/14/22 1200  ceFAZolin (ANCEF) IVPB 2g/100 mL premix       Note to Pharmacy: Dr. Carola Frost reviewed allergies, wants Ancef to be given   2 g 200 mL/hr over 30 Minutes Intravenous  Once 06/14/22 1147 06/14/22 1304     .  POD/HD#: 57  77 y/o female with complex medical history s/p fall with R hip fracture    -fragility fracture R hip s/p IMN R hip  Weightbearing WBAT R leg with walker/assistance               ROM/Activity                         Unrestricted ROM R hip and knee                          Activity as tolerated                  Wound care                         Dressing changes as needed to right hip    PT/OT evals    Ice PRN R hip      - Pain management:             Multimodal              Minimize  narcotics   Norco dose decreased today    - ABL anemia/Hemodynamics            h/h looks to have stabilized  Continue to monitor    - Medical issues              Per primary    - DVT/PE prophylaxis:             Scds             Resumed plavix   - ID:              Periop abx completed    - Metabolic Bone Disease:             Vitamin deficiency                          Noted on outside labs about 4 days ago                          Supplement             This hip fracture is a fragility fracture             Outpt workup                          Referral to osteoporosis clinic                          DEXA                Appreciate RD recs    - Activity:             As above   -  FEN             NS bolus today    - Impediments to fracture healing:             Vitamin d deficiency              Fragility fracture             Age              DM    - Dispo:             Therapies             TOC consult              Ortho issues stable  SNF at DC                   Mearl Latin, PA-C [phone] (C) 06/17/2022, 12:44 PM  [facility] [address] [place] [address] [phone] Collier Bullock (F)    After 5pm and on the weekends please log on to Amion, go to orthopaedics and the look under the [facility] Call for the provider(s) on call. You can also call our office at [phone] and then follow the prompts to be connected to the call team.  Patient ID: Chelsea [name], female   DOB: 1945-06-12, 77 y.o.   MRN: [id number]

## 2022-06-17 NOTE — Significant Event (Signed)
Rapid Response Event Note   Reason for Call :  AMS, drowisness  Initial Focused Assessment:  Pt lying in bed with eyes open. She is confused to the year but oriented to self, place, and situation. Pt needs repeated stimulation to follow commands but does move all extremities(L>R-pt with h/o CVA with R sided deficits and pt has had R hip surgery restricting her RLE movement). Pupils 4, equal, reactive. NIH-11. Lungs clear/diminished. ABD soft/nontender. Skin warm to touch.  T-98.7, HR-74, SBP-118, RR-20, SpO2-100% on 3L [place].  Interventions:  CBG-134 EKG-NSR, cannot r/o ANT infarct, age undetermined PCXR U/A CBCD/CMP/LA/Ammonia NIH x 1 Neuro checks q4h Plan of Care:  Pt has R sided weakness on exam but pt with h/o R sided weakness and pt just had R hip surgery. Last pain meds were at 1730. Pt awake-doesn't need narcan at this time. Await lab results. Please call RRT if further assistance needed.    Event Summary:   MD Notified: Virgel Manifold, NP Call [phone] Arrival [phone] End [id number]  Terrilyn Saver, RN

## 2022-06-17 NOTE — Progress Notes (Signed)
Occupational Therapy Treatment Patient Details Name: Chelsea [name] MRN: [id number] DOB: [birth year] Today's Date: 06/17/2022   History of present illness 77 yo female with onset of mechanical fall was admitted 6/1 for management. Had R intertrochanteric fracture with IM nailing done 6/3.  Has been walking only short trips at baseline, SOB with pt requiring HOB elevated at night. PMHx:  bronchitis, Stroke, R hemiparesis, LBBB, COPD, carotid stenosis, HTN, MI, L eye blind, DM, R hippocampus infarct   OT comments  Pt motivated to participate today, able to perform several sit to stands from elevated surface, x2 assist using Stedy for support. Pt able to tolerate stedy for several minutes, did not want to sit in recliner today, returned to bed. Pt continues to require max A x2 for bed mobility, able to transfer to [facility], not able to take steps currently. Pt would benefit from continued skilled therapy to improve functional strength, DC plan still appropriate.   Recommendations for follow up therapy are one component of a multi-disciplinary discharge planning process, led by the attending physician.  Recommendations may be updated based on patient status, additional functional criteria and insurance authorization.    Assistance Recommended at Discharge Frequent or constant Supervision/Assistance  Patient can return home with the following  A lot of help with walking and/or transfers;A lot of help with bathing/dressing/bathroom;Assistance with cooking/housework;Direct supervision/assist for medications management;Assist for transportation;Help with stairs or ramp for entrance   Equipment Recommendations   (defer)    Recommendations for Other Services      Precautions / Restrictions Precautions Precautions: Fall Precaution Comments: monitor O2 sats, BP, HR Restrictions Weight Bearing Restrictions: Yes RLE Weight Bearing: Weight bearing as tolerated       Mobility Bed Mobility Overal bed  mobility: Needs Assistance Bed Mobility: Supine to Sit, Sit to Supine     Supine to sit: Max assist, +2 for physical assistance, +2 for safety/equipment Sit to supine: Max assist, +2 for physical assistance, +2 for safety/equipment   General bed mobility comments: able to perform bed mobility x2 for assistance torso and BLEs.    Transfers Overall transfer level: Needs assistance Equipment used:  Antony Salmon) Transfers: Sit to/from Stand, Bed to chair/wheelchair/BSC Sit to Stand: Max assist, From elevated surface Stand pivot transfers: Max assist, From elevated surface         General transfer comment: Pt able to tolerate standing with max A, difficulty with taking steps, able to tolerate Stedy for several minutes. Transfer via Lift Equipment: Stedy   Balance Overall balance assessment: Needs assistance Sitting-balance support: Feet supported Sitting balance-Leahy Scale: Poor Sitting balance - Comments: sitting EOB   Standing balance support: Bilateral upper extremity supported, Reliant on assistive device for balance, During functional activity Standing balance-Leahy Scale: Poor Standing balance comment: used Stedy for standing, able to stand up to 30 seconds multiple times, able to tolerate stedy for several minutes                           ADL either performed or assessed with clinical judgement   ADL                                              Extremity/Trunk Assessment              Vision       Perception  Praxis      Cognition Arousal/Alertness: Lethargic Behavior During Therapy: Flat affect Overall Cognitive Status: Difficult to assess                                 General Comments: Pt is tired, lethargic, does remember me from two days ago, and was motivated to participate despite fatigue.        Exercises      Shoulder Instructions       General Comments      Pertinent Vitals/ Pain        Pain Assessment Pain Assessment: 0-10 Pain Score: 4  Faces Pain Scale: Hurts little more Pain Location: R hip during movement Pain Descriptors / Indicators: Operative site guarding, Tender Pain Intervention(s): Monitored during session  Home Living                                          Prior Functioning/Environment              Frequency  Min 2X/week        Progress Toward Goals  OT Goals(current goals can now be found in the care plan section)  Progress towards OT goals: Progressing toward goals  Acute Rehab OT Goals Patient Stated Goal: to improve functional strength OT Goal Formulation: With patient/family Time For Goal Achievement: 06/29/22 Potential to Achieve Goals: Good ADL Goals Pt Will Perform Lower Body Dressing: with min assist;with adaptive equipment;sitting/lateral leans Pt Will Transfer to Toilet: with mod assist;stand pivot transfer;bedside commode Pt Will Perform Toileting - Clothing Manipulation and hygiene: with min assist;with adaptive equipment;sitting/lateral leans Additional ADL Goal #1: Pt will be able to perform supine to sit to prepare for transfer or sitting ADLs with min A  Plan Discharge plan remains appropriate    Co-evaluation                 AM-PAC OT "6 Clicks" Daily Activity     Outcome Measure   Help from another person eating meals?: A Little Help from another person taking care of personal grooming?: A Little Help from another person toileting, which includes using toliet, bedpan, or urinal?: A Lot Help from another person bathing (including washing, rinsing, drying)?: A Lot Help from another person to put on and taking off regular upper body clothing?: A Lot Help from another person to put on and taking off regular lower body clothing?: Total 6 Click Score: 13    End of Session Equipment Utilized During Treatment: Other (comment);Gait belt (stedy)  OT Visit Diagnosis: Unsteadiness on feet  (R26.81);Other abnormalities of gait and mobility (R26.89);Muscle weakness (generalized) (M62.81);History of falling (Z91.81);Pain Pain - Right/Left: Right Pain - part of body: Hip   Activity Tolerance Patient tolerated treatment well;Patient limited by lethargy   Patient Left in bed;with call bell/phone within reach;with family/visitor present   Nurse Communication Mobility status        Time: 1610-9604 OT Time Calculation (min): 18 min  Charges: OT General Charges $OT Visit: 1 Visit OT Treatments $Therapeutic Activity: 8-22 mins  Chelsea [name], OTR/L   Chelsea [name] 06/17/2022, 3:45 PM

## 2022-06-17 NOTE — TOC Progression Note (Signed)
Transition of Care [facility]) - Progression Note    Patient Details  Name: Chelsea [name] MRN: [id number] Date of Birth: 06-25-1945  Transition of Care [facility]) CM/SW Contact  Lorri Frederick, LCSW Phone Number: 06/17/2022, 1:26 PM  Clinical Narrative:   TC  Tammy/HTA.  PTAR approved: [id number]. SNF auth not approved due to pt being custodial.  Peer to peer offered with Dr Logan Bores, [phone] by 10am Friday.  MD informed.  CSW spoke with pt daughter [name] and pt in room.  Daughter confirms that pt was ambulatory in the home with a rolling walker prior to the hip fx.       Barriers to Discharge: Continued Medical Work up, SNF Pending bed offer  Expected Discharge Plan and Services In-house Referral: Clinical Social Work   Post Acute Care Choice: Skilled Nursing Facility Living arrangements for the past 2 months: Single Family Home                                       Social Determinants of Health (SDOH) Interventions SDOH Screenings   Food Insecurity: No Food Insecurity (06/13/2022)  Housing: Patient Declined (06/13/2022)  Transportation Needs: No Transportation Needs (06/13/2022)  Utilities: Not At Risk (06/13/2022)  Depression (PHQ2-9): High Risk (09/29/2020)  Tobacco Use: Medium Risk (06/15/2022)    Readmission Risk Interventions    06/27/2020    2:08 PM 12/05/2019    1:40 PM  Readmission Risk Prevention Plan  Transportation Screening Complete Complete  PCP or Specialist Appt within 3-5 Days  Complete  HRI or Home Care Consult Complete Complete  Social Work Consult for Recovery Care Planning/Counseling Complete Complete  Palliative Care Screening Not Applicable Not Applicable  Medication Review [employer])  Complete

## 2022-06-17 NOTE — Progress Notes (Signed)
Patient drowsy with AMS. Patient was alert and oriented during the day per report from day shift and daughter. On call provider, charge nurse, and rapid notified. Orders received and initiated. Will continue to monitor.

## 2022-06-17 NOTE — Progress Notes (Signed)
    Patient Name: MELEIGHA OKEEFE           DOB: 01-Feb-1945  MRN: [id number]      Admission Date: 06/12/2022  Attending Provider: Willeen Niece, MD  Primary Diagnosis: Hip fracture, right, closed, initial encounter [facility])   Level of care: Med-Surg    CROSS COVER NOTE   Date of Service   06/17/2022   VERNESE VANDERPOEL, 77 y.o. female, was admitted on 06/12/2022 for Hip fracture, right, closed, initial encounter (HCC).    HPI/Events of Note   Altered mental status/ drowsy Patient was A/O x 4 during daytime, but is now disoriented x 4.  Hemodynamically stable.  Patient has been receiving Norco and Dilaudid for pain management.  Creatinine elevated 2.36, above baseline. Hold sedating agents. Hx CVA with residual visual impairment and right hemiparesis. Unsure of last known well.   Given change in mentation, rapid response RN was called.  During RR RN reassessment, patient is drowsy but alert, oriented x 3.  Patient is able to follow commands with repeated stimulation.  All extremities purposefully moving, right side weaker (history of hemiparesis and right hip surgery).  No other focal neuro deficits reported. Glucose 134, EKG NSR without ST segment elevation Low-grade fever noted earlier, but has been receiving Tylenol every 6 hours so may actually be higher. CBC, BMP, UA, chest x-ray, lactic acid ordered.  Differential dx: acute delirium, medication adverse effect, metabolic encephalopathy, CVA, TIA   Interventions/ Plan   Neuro checks, NIH Head CT without contrast- No acute intracranial abnormality Labs: CBC, CMP, ammonia, lactic acid, UA, ammonia Chest x-ray, EKG Delirium precautions RN to hold sedating meds        Anthoney Harada, DNP, ACNPC- AG [facility] [place]

## 2022-06-17 NOTE — Progress Notes (Signed)
PROGRESS NOTE    TURQUOISE BATTISTA  GMW:[id number] DOB: 11/08/1945 DOA: 06/12/2022  PCP: Trisha Mangle, FNP   Brief Narrative:  This 77 yrs old Female with DM, hx. CVA with residual vision impairment and Right sided hemiparesis, uses walker at baseline, lives at home, HLD, CKD IV with baseline creatinine 1.9-2.1, PVD s/p CEA 2021, and uncontrolled HTN who presented with mechanical fall. In the ER, radiograph showed RIGHT hip fracture.  Orthopedic consulted.  Patient underwent IM nail by Dr. Sherilyn Cooter.  Creatinine spiked to 2.8 postoperatively.  Started on IV hydration.  PT and OT recommended CIR which is denied,  awaiting SNF placement.  Assessment & Plan:   Principal Problem:   Hip fracture, right, closed, initial encounter (HCC) Active Problems:   AKI (acute kidney injury) on CKD IV   Controlled type 2 diabetes mellitus with hyperglycemia, without long-term current use of insulin (HCC)   Essential hypertension   CKD (chronic kidney disease), stage IV (HCC)   Malnutrition of moderate degree   History of CVA (cerebrovascular accident)   Pure hypercholesterolemia   Fall   PVD (peripheral vascular disease) (HCC)   Wheezing   Hyperkalemia   Right hip fracture, closed: S/p INTRAMEDULLARY (IM) NAIL INTERTROCHANTERIC, REPAIR OF RIGHT HIP FRACTURE 6/3 by Dr. Carola Frost - WBAT R leg with walker. - Unrestricted ROM - Analgesia per ortho - DVT PPx SCDs and Plavix. PT and OT recommended SNF       AKI (acute kidney injury) on CKD IV: Baseline creatinine 2-2.2, spiked up to 2.8  down to 2.09 today with fluids. IV fluid discontinued yesterday.  Serum creatinine back to baseline. Avoid nephrotoxins Hold olmesartan, torsemide Avoid hypotension Hold finerenone, Coreg due to low BP   Hyperkalemia: Due to AKI, mild, better today. Continue to monitor serum creatinine, potassium   Wheezing: Former smoker but no prior dx. of CHF or COPD. Patient was prescribed supplemental oxygen after her  stroke many years ago but never uses it at home, family confirmed that she does use oxygen at home as needed, she is not using alcohol.  Echo here with grade II DD, normal valves, normal EF.   Continue ICS/LABA, started here, no need to continue at d/c   PVD (peripheral vascular disease) (HCC) Continue aspirin, Crestor, Plavix Hold Coreg   Fall: PT and OT evaluation.     Pure hypercholesterolemia Continue Crestor   History of CVA (cerebrovascular accident) - Continue aspirin, Plavix - Trend CBC - Continue Crestor   Malnutrition of moderate degree. As evidenced by loss of subcutaneous muscle mass and fat diffusely   CKD (chronic kidney disease), stage IV (HCC) Cr baseline 2.0-2.2, see above  - Hold finerenone until BP improves.   Essential hypertension BP low post-op, improved with fluids. Check orthostatic blood pressure. - Repeat fluids - Hold Coreg, finerenone - Hold ARB due to AKI - Hold torsemide    Controlled type 2 diabetes mellitus with hyperglycemia, without long-term current use of insulin (HCC) A1c 8.1%.  Diet controlled at baseline. Glucoses fairly good here. Continue SS correction    DVT prophylaxis: SCDs Code Status: DNR Family Communication: Family at bed side. Disposition Plan:     Status is: Inpatient Remains inpatient appropriate because: Admitted to s/p mechanical fall with right hip fracture s/p ORIF.  Now has developed AKI on CKD.   Renal functions improving.   PT recommended SNF   Consultants:  Orthopeadics  Procedures: ORIF  Antimicrobials:  Anti-infectives (From admission, onward)    Start  Dose/Rate Route Frequency Ordered Stop   06/14/22 1200  ceFAZolin (ANCEF) IVPB 2g/100 mL premix       Note to Pharmacy: Dr. Carola Frost reviewed allergies, wants Ancef to be given   2 g 200 mL/hr over 30 Minutes Intravenous  Once 06/14/22 1147 06/14/22 1304      Subjective: Patient seen and examined at bedside.  Overnight events  noted. Patient was confused,  likely secondary to opiate pain medication.  CT head negative for stroke. She seems much improved.  Renal functions are back to baseline.  Objective: Vitals:   06/16/22 2200 06/17/22 0500 06/17/22 0724 06/17/22 0751  BP: (!) 114/47 (!) 148/59 (!) 133/52   Pulse: 70 80 69   Resp: 17 18 16    Temp: 98.2 F (36.8 C) 98.4 F (36.9 C) 98.6 F (37 C)   TempSrc: Oral Oral    SpO2: 100% 95% 98% 99%  Weight:      Height:        Intake/Output Summary (Last 24 hours) at 06/17/2022 1257 Last data filed at 06/16/2022 2200 Gross per 24 hour  Intake 60 ml  Output 300 ml  Net -240 ml   Filed Weights   06/12/22 1340 06/14/22 0934  Weight: 69.4 kg 69.4 kg    Examination:  General exam: Appears comfortable, calm, deconditioned, not in any distress. Respiratory system: CTA bilaterally . Respiratory effort normal. RR 15 Cardiovascular system: S1 & S2 heard, regular rate and rhythm, no murmur. Gastrointestinal system: Abdomen is soft, non tender, non distended, bowel sounds present. Central nervous system: Alert and oriented X 2. No focal neurological deficits. Extremities: Right hip tenderness, stitches noted. Skin: No rashes, lesions or ulcers Psychiatry: Judgement and insight appear normal. Mood & affect appropriate.     Data Reviewed: I have personally reviewed following labs and imaging studies  CBC: Recent Labs  Lab 06/12/22 1417 06/13/22 0212 06/14/22 0246 06/15/22 0331 06/15/22 1316 06/16/22 0308 06/16/22 1335 06/17/22 0941  WBC 14.9*   < > 8.8 7.6 7.2 8.0  --  7.3  NEUTROABS 12.6*  --   --   --   --   --   --  5.3  HGB 11.0*   < > 9.8* 9.1* 8.0* 7.6* 7.4* 8.0*  HCT 34.3*   < > 30.1* 28.1* 25.4* 24.1* 23.8* 24.9*  MCV 95.8   < > 96.2 98.6 98.1 99.2  --  98.4  PLT 229   < > 190 180 183 181  --  197   < > = values in this interval not displayed.   Basic Metabolic Panel: Recent Labs  Lab 06/14/22 0246 06/14/22 1548 06/15/22 0331  06/16/22 0308 06/16/22 0942 06/17/22 0511 06/17/22 0941  NA 134* 136 135 134*  --  133*  --   K 5.3* 5.4* 5.2* 5.2* 5.0 5.3* 5.1  CL 100 103 101 106  --  104  --   CO2 25 23 24 22   --  20*  --   GLUCOSE 212* 213* 204* 169*  --  142*  --   BUN 58* 52* 54* 48*  --  48*  --   CREATININE 2.80* 2.65* 2.62* 2.36*  --  2.09*  --   CALCIUM 8.1* 8.5* 8.2* 8.0*  --  8.2*  --   MG  --   --  2.2  --   --   --   --    GFR: Estimated Creatinine Clearance: 21.6 mL/min (A) (by C-G formula based on SCr  of 2.09 mg/dL (H)). Liver Function Tests: Recent Labs  Lab 06/15/22 0331 06/17/22 0511  AST 20 15  ALT 12 9  ALKPHOS 69 66  BILITOT 0.7 0.6  PROT 5.3* 4.9*  ALBUMIN 2.8* 2.6*   No results for input(s): "LIPASE", "AMYLASE" in the last 168 hours. Recent Labs  Lab 06/17/22 0511  AMMONIA 21   Coagulation Profile: No results for input(s): "INR", "PROTIME" in the last 168 hours. Cardiac Enzymes: No results for input(s): "CKTOTAL", "CKMB", "CKMBINDEX", "TROPONINI" in the last 168 hours. BNP (last 3 results) No results for input(s): "PROBNP" in the last 8760 hours. HbA1C: No results for input(s): "HGBA1C" in the last 72 hours. CBG: Recent Labs  Lab 06/16/22 1609 06/16/22 2045 06/17/22 0001 06/17/22 0720 06/17/22 1129  GLUCAP 132* 115* 134* 141* 195*   Lipid Profile: No results for input(s): "CHOL", "HDL", "LDLCALC", "TRIG", "CHOLHDL", "LDLDIRECT" in the last 72 hours. Thyroid Function Tests: No results for input(s): "TSH", "T4TOTAL", "FREET4", "T3FREE", "THYROIDAB" in the last 72 hours. Anemia Panel: No results for input(s): "VITAMINB12", "FOLATE", "FERRITIN", "TIBC", "IRON", "RETICCTPCT" in the last 72 hours. Sepsis Labs: Recent Labs  Lab 06/17/22 0511  LATICACIDVEN 0.6    Recent Results (from the past 240 hour(s))  Surgical PCR screen     Status: Abnormal   Collection Time: 06/14/22 12:45 AM   Specimen: Nasal Mucosa; Nasal Swab  Result Value Ref Range Status   MRSA, PCR  NEGATIVE NEGATIVE Final   Staphylococcus aureus POSITIVE (A) NEGATIVE Final    Comment: (NOTE) The Xpert SA Assay (FDA approved for NASAL specimens in patients 22 years of age and older), is one component of a comprehensive surveillance program. It is not intended to diagnose infection nor to guide or monitor treatment. Performed at [facility], [address] [place], [place] [address]     Radiology Studies: CT HEAD WO CONTRAST ( )  Result Date: 06/17/2022 CLINICAL DATA:  Altered mental status EXAM: CT HEAD WITHOUT CONTRAST TECHNIQUE: Contiguous axial images were obtained from the base of the skull through the vertex without intravenous contrast. RADIATION DOSE REDUCTION: This exam was performed according to the departmental dose-optimization program which includes automated exposure control, adjustment of the mA and/or kV according to patient size and/or use of iterative reconstruction technique. COMPARISON:  05/01/2021 FINDINGS: Brain: There is no mass, hemorrhage or extra-axial collection. There is generalized atrophy without lobar predilection. Hypodensity of the white matter is most commonly associated with chronic microvascular disease. Old posterior left parietal infarct and multiple old basal ganglia small vessel infarcts. Vascular: No abnormal hyperdensity of the major intracranial arteries or dural venous sinuses. No intracranial atherosclerosis. Skull: The visualized skull base, calvarium and extracranial soft tissues are normal. Sinuses/Orbits: No fluid levels or advanced mucosal thickening of the visualized paranasal sinuses. No mastoid or middle ear effusion. The orbits are normal. IMPRESSION: 1. No acute intracranial abnormality. 2. Old posterior left parietal infarct and multiple old basal ganglia small vessel infarcts. 3. Hypodensity of the white matter is most commonly associated with chronic microvascular disease. Electronically Signed   By: Deatra Robinson M.D.   On:  06/17/2022 03:41    Scheduled Meds:  acetaminophen  650 mg Oral Q6H   aspirin EC  81 mg Oral Daily   budesonide (PULMICORT) nebulizer solution  0.25 mg Nebulization BID   Chlorhexidine Gluconate Cloth  6 each Topical Daily   cholecalciferol  2,000 Units Oral Daily   clopidogrel  75 mg Oral Daily   docusate sodium  100 mg Oral BID   feeding supplement (GLUCERNA SHAKE)  237 mL Oral TID BM   fluticasone furoate-vilanterol  1 puff Inhalation Daily   guaiFENesin  600 mg Oral BID   insulin aspart  0-9 Units Subcutaneous TID WC   ipratropium-albuterol  3 mL Nebulization BID   multivitamin with minerals  1 tablet Oral Daily   mupirocin ointment  1 Application Nasal BID   polyethylene glycol  17 g Oral Daily   rosuvastatin  5 mg Oral Daily   sodium bicarbonate  650 mg Oral Daily   tamsulosin  0.4 mg Oral QPM   Continuous Infusions:   LOS: 5 days    Time spent: 35 mins    Willeen Niece, MD [facility]   If 7PM-7AM, please contact night-coverage

## 2022-06-17 NOTE — Plan of Care (Signed)
  Problem: Pain Managment: Goal: General experience of comfort will improve Outcome: Progressing   Problem: Safety: Goal: Ability to remain free from injury will improve Outcome: Progressing   Problem: Skin Integrity: Goal: Risk for impaired skin integrity will decrease Outcome: Progressing   

## 2022-06-18 DIAGNOSIS — S72001A Fracture of unspecified part of neck of right femur, initial encounter for closed fracture: Secondary | ICD-10-CM | POA: Diagnosis not present

## 2022-06-18 LAB — BASIC METABOLIC PANEL
Anion gap: 7 (ref 5–15)
BUN: 44 mg/dL — ABNORMAL HIGH (ref 8–23)
CO2: 22 mmol/L (ref 22–32)
Calcium: 8.2 mg/dL — ABNORMAL LOW (ref 8.9–10.3)
Chloride: 103 mmol/L (ref 98–111)
Creatinine, Ser: 1.98 mg/dL — ABNORMAL HIGH (ref 0.44–1.00)
GFR, Estimated: 25 mL/min — ABNORMAL LOW (ref >60)
Glucose, Bld: 171 mg/dL — ABNORMAL HIGH (ref 70–99)
Potassium: 5.4 mmol/L — ABNORMAL HIGH (ref 3.5–5.1)
Sodium: 132 mmol/L — ABNORMAL LOW (ref 135–145)

## 2022-06-18 LAB — POTASSIUM: Potassium: 5.7 mmol/L — ABNORMAL HIGH (ref 3.5–5.1)

## 2022-06-18 LAB — MAGNESIUM: Magnesium: 2.6 mg/dL — ABNORMAL HIGH (ref 1.7–2.4)

## 2022-06-18 LAB — CBC
HCT: 26.1 % — ABNORMAL LOW (ref 36.0–46.0)
Hemoglobin: 8.3 g/dL — ABNORMAL LOW (ref 12.0–15.0)
MCH: 30.5 pg (ref 26.0–34.0)
MCHC: 31.8 g/dL (ref 30.0–36.0)
MCV: 96 fL (ref 80.0–100.0)
Platelets: 240 10*3/uL (ref 150–400)
RBC: 2.72 MIL/uL — ABNORMAL LOW (ref 3.87–5.11)
RDW: 12.4 % (ref 11.5–15.5)
WBC: 7.9 10*3/uL (ref 4.0–10.5)
nRBC: 0 % (ref 0.0–0.2)

## 2022-06-18 LAB — GLUCOSE, CAPILLARY
Glucose-Capillary: 156 mg/dL — ABNORMAL HIGH (ref 70–99)
Glucose-Capillary: 251 mg/dL — ABNORMAL HIGH (ref 70–99)
Glucose-Capillary: 176 mg/dL — ABNORMAL HIGH (ref 70–99)
Glucose-Capillary: 203 mg/dL — ABNORMAL HIGH (ref 70–99)

## 2022-06-18 LAB — PHOSPHORUS: Phosphorus: 3.5 mg/dL (ref 2.5–4.6)

## 2022-06-18 MED ORDER — ACETAMINOPHEN 325 MG PO TABS: 325.0000 mg | ORAL_TABLET | Freq: Four times a day (QID) | ORAL | 0 refills | Status: AC | PRN

## 2022-06-18 MED ORDER — SIMETHICONE 80 MG PO CHEW
80.0000 mg | CHEWABLE_TABLET | Freq: Four times a day (QID) | ORAL | Status: DC | PRN
  Administered 2022-06-18 – 2022-06-19 (×2): 80 mg via ORAL
  Filled 2022-06-18 (×2): qty 1

## 2022-06-18 MED ORDER — HYDROCODONE-ACETAMINOPHEN 5-325 MG PO TABS: 1.0000 | ORAL_TABLET | Freq: Four times a day (QID) | ORAL | 0 refills | Status: DC | PRN

## 2022-06-18 MED ORDER — GLUCERNA SHAKE PO LIQD: 237.0000 mL | Freq: Three times a day (TID) | ORAL | 0 refills | Status: DC

## 2022-06-18 MED ORDER — VITAMIN D3 25 MCG PO TABS: 2000.0000 [IU] | ORAL_TABLET | Freq: Every day | ORAL | Status: DC

## 2022-06-18 MED ORDER — ADULT MULTIVITAMIN W/MINERALS CH: 1.0000 | ORAL_TABLET | Freq: Every day | ORAL | Status: DC

## 2022-06-18 NOTE — Progress Notes (Signed)
Physical Therapy Treatment Patient Details Name: Chelsea [name] MRN: [id number] DOB: 02/16/1945 Today's Date: 06/18/2022   History of Present Illness 77 yo female with onset of mechanical fall was admitted 6/1 for management. Had R intertrochanteric fracture with IM nailing done 6/3.  Has been walking only short trips at baseline, SOB with pt requiring HOB elevated at night. PMHx:  bronchitis, Stroke, R hemiparesis, LBBB, COPD, carotid stenosis, HTN, MI, L eye blind, DM, R hippocampus infarct    PT Comments    Pt was seen for progression of mobility on stedy to stand and pivot to the chair, where she was comfortably able to sit up.  Pt is getting up to move with two person help, very much able to stand with stedy bar and one but two for safety.  Will recommend updated rehab to <3 hours a day, as pt is tolerant to be in a chair and to do ROM to legs.  Follow acutely for goals of PT as outlined on POC.   Recommendations for follow up therapy are one component of a multi-disciplinary discharge planning process, led by the attending physician.  Recommendations may be updated based on patient status, additional functional criteria and insurance authorization.  Follow Up Recommendations  Can patient physically be transported by private vehicle: No    Assistance Recommended at Discharge Frequent or constant Supervision/Assistance  Patient can return home with the following Two people to help with walking and/or transfers;A lot of help with bathing/dressing/bathroom;Assistance with cooking/housework;Assist for transportation;Help with stairs or ramp for entrance   Equipment Recommendations  None recommended by PT (await rehab determination)    Recommendations for Other Services       Precautions / Restrictions Precautions Precautions: Fall Precaution Comments: monitor O2 sats, BP, HR Restrictions RLE Weight Bearing: Weight bearing as tolerated     Mobility  Bed Mobility Overal bed  mobility: Needs Assistance Bed Mobility: Supine to Sit     Supine to sit: Max assist, +2 for physical assistance, +2 for safety/equipment          Transfers Overall transfer level: Needs assistance Equipment used: 1 person hand held assist, 2 person hand held assist, Ambulation equipment used Transfers: Sit to/from Stand Sit to Stand: Mod assist, +2 physical assistance, +2 safety/equipment, From elevated surface           General transfer comment: stood with equipment and able to sit with two person help Transfer via Lift Equipment: Stedy  Ambulation/Gait               General Gait Details: unable   Stairs             Wheelchair Mobility    Modified Rankin (Stroke Patients Only)       Balance Overall balance assessment: Needs assistance Sitting-balance support: Feet supported Sitting balance-Leahy Scale: Fair       Standing balance-Leahy Scale: Poor Standing balance comment: stedy to chair                            Cognition Arousal/Alertness: Awake/alert Behavior During Therapy: Flat affect Overall Cognitive Status: Impaired/Different from baseline Area of Impairment: Attention, Orientation                 Orientation Level: Time Current Attention Level: Selective           General Comments: a bit flat but can follow directions        Exercises General  Exercises - Lower Extremity Ankle Circles/Pumps: AAROM, 5 reps Quad Sets: AROM, 5 reps Gluteal Sets: Other (comment) (hip rotation) Hip ABduction/ADduction: AROM, AAROM, 10 reps    General Comments General comments (skin integrity, edema, etc.): Pt is more tolerant to move, set up in recliner to stretch LE's and pt demonstrated her ability to move herself      Pertinent Vitals/Pain Pain Assessment Pain Assessment: Faces Faces Pain Scale: Hurts a little bit Pain Location: R hip during movement Pain Intervention(s): Limited activity within patient's  tolerance, Monitored during session, Premedicated before session, Repositioned    Home Living                          Prior Function            PT Goals (current goals can now be found in the care plan section) Progress towards PT goals: Progressing toward goals    Frequency    Min 3X/week      PT Plan Discharge plan needs to be updated    Co-evaluation              AM-PAC PT "6 Clicks" Mobility   Outcome Measure  Help needed turning from your back to your side while in a flat bed without using bedrails?: Total Help needed moving from lying on your back to sitting on the side of a flat bed without using bedrails?: Total Help needed moving to and from a bed to a chair (including a wheelchair)?: Total Help needed standing up from a chair using your arms (e.g., wheelchair or bedside chair)?: Total Help needed to walk in hospital room?: Total Help needed climbing 3-5 steps with a railing? : Total 6 Click Score: 6    End of Session   Activity Tolerance: Patient tolerated treatment well;Patient limited by pain Patient left: in chair;with call bell/phone within reach;with chair alarm set;with family/visitor present Nurse Communication: Mobility status PT Visit Diagnosis: Unsteadiness on feet (R26.81);Muscle weakness (generalized) (M62.81);Adult, failure to thrive (R62.7);Pain;Hemiplegia and hemiparesis Hemiplegia - Right/Left: Right Hemiplegia - dominant/non-dominant: Dominant Hemiplegia - caused by: Unspecified Pain - Right/Left: Right Pain - part of body: Hip     Time: 8119-1478 PT Time Calculation (min) (ACUTE ONLY): 30 min  Charges:  $Therapeutic Exercise: 8-22 mins $Therapeutic Activity: 8-22 mins          Ivar Drape 06/18/2022, 11:44 AM  Samul Dada, PT PhD Acute Rehab Dept. Number: [facility] [id number] and [facility] [phone]

## 2022-06-18 NOTE — Progress Notes (Signed)
PROGRESS NOTE    AVIGAIL GRANADOS  ZOX:[id number] DOB: Dec 19, 1945 DOA: 06/12/2022  PCP: Trisha Mangle, FNP   Brief Narrative:  This 77 yrs old Female with DM, hx. CVA with residual vision impairment and Right sided hemiparesis, uses walker at baseline, lives at home, HLD, CKD IV with baseline creatinine 1.9-2.1, PVD s/p CEA 2021, and uncontrolled HTN who presented with mechanical fall. In the ER, radiograph showed RIGHT hip fracture.  Orthopedic consulted.  Patient underwent IM nail by Dr. Sherilyn Cooter.  Creatinine spiked to 2.8 postoperatively.  Started on IV hydration.  PT and OT recommended CIR which is denied,  awaiting SNF placement.  Assessment & Plan:   Principal Problem:   Hip fracture, right, closed, initial encounter (HCC) Active Problems:   AKI (acute kidney injury) on CKD IV   Controlled type 2 diabetes mellitus with hyperglycemia, without long-term current use of insulin (HCC)   Essential hypertension   CKD (chronic kidney disease), stage IV (HCC)   Malnutrition of moderate degree   History of CVA (cerebrovascular accident)   Pure hypercholesterolemia   Fall   PVD (peripheral vascular disease) (HCC)   Wheezing   Hyperkalemia  Right hip fracture, closed: S/p INTRAMEDULLARY (IM) NAIL INTERTROCHANTERIC, REPAIR OF RIGHT HIP FRACTURE 6/3 by Dr. Carola Frost - WBAT R leg with walker. - Unrestricted ROM - Analgesia per ortho - DVT PPx SCDs and Plavix. PT and OT recommended SNF     AKI (acute kidney injury) on CKD IV: Baseline creatinine 2-2.2, spiked up to 2.8  down to 1.9 today with fluids. IV fluid discontinued yesterday.  Serum creatinine back to baseline. Avoid nephrotoxins. Hold olmesartan, torsemide Avoid hypotension Hold finerenone, Coreg due to low BP   Hyperkalemia: Due to AKI, mild, better today. Continue to monitor serum creatinine, potassium.   Wheezing: Former smoker but no prior dx. of CHF or COPD. Patient was prescribed supplemental oxygen after her  stroke many years ago but never uses it at home, family confirmed that she does use oxygen at home as needed, she is not using alcohol.  Echo here with grade II DD, normal valves, normal EF.   Continue ICS/LABA, started here, no need to continue at d/c   PVD (peripheral vascular disease) (HCC) Continue aspirin, Crestor, Plavix Hold Coreg   Fall: PT and OT evaluation.     Pure hypercholesterolemia Continue Crestor   History of CVA (cerebrovascular accident) - Continue aspirin, Plavix - Trend CBC - Continue Crestor   Malnutrition of moderate degree. As evidenced by loss of subcutaneous muscle mass and fat diffusely   CKD (chronic kidney disease), stage IV (HCC) Cr baseline 2.0-2.2, see above  - Hold finerenone until BP improves.   Essential hypertension BP low post-op, improved with fluids. Check orthostatic blood pressure. - Repeat fluids - Hold Coreg, finerenone - Hold ARB due to AKI - Hold torsemide    Controlled type 2 diabetes mellitus with hyperglycemia, without long-term current use of insulin (HCC) A1c 8.1%.  Diet controlled at baseline. Glucoses fairly good here. Continue SS correction    DVT prophylaxis: SCDs Code Status: DNR Family Communication: Family at bed side. Disposition Plan:     Status is: Inpatient Remains inpatient appropriate because: Admitted to s/p mechanical fall with right hip fracture s/p ORIF.  Now has developed AKI on CKD.   Renal functions improving.   PT recommended SNF.Marland Kitchen Peer to peer review completed.  Patient needs updated PT note for approval.   Consultants:  [facility]adics  Procedures: ORIF  Antimicrobials:  Anti-infectives (From admission, onward)    Start     Dose/Rate Route Frequency Ordered Stop   06/14/22 1200  ceFAZolin (ANCEF) IVPB 2g/100 mL premix       Note to Pharmacy: Dr. Carola Frost reviewed allergies, wants Ancef to be given   2 g 200 mL/hr over 30 Minutes Intravenous  Once 06/14/22 1147 06/14/22 1304       Subjective: Patient seen and examined at bedside.  Overnight events noted. Patient seems much improved.  She remains back to baseline mental status. Renal functions are back to baseline. She is awaiting SNF placement.  Objective: Vitals:   06/18/22 0832 06/18/22 0833 06/18/22 0834 06/18/22 1000  BP:    (!) 140/64  Pulse:    73  Resp:    18  Temp:    98.4 F (36.9 C)  TempSrc:    Oral  SpO2: 97% 100% 100% 95%  Weight:      Height:        Intake/Output Summary (Last 24 hours) at 06/18/2022 1252 Last data filed at 06/18/2022 1000 Gross per 24 hour  Intake 360 ml  Output 700 ml  Net -340 ml   Filed Weights   06/12/22 1340 06/14/22 0934  Weight: 69.4 kg 69.4 kg    Examination:  General exam: Appears comfortable, deconditioned, not in any acute distress. Respiratory system: CTA bilaterally . Respiratory effort normal. RR 14 Cardiovascular system: S1 & S2 heard, regular rate and rhythm, no murmur. Gastrointestinal system: Abdomen is soft, non tender, non distended, bowel sounds present. Central nervous system: Alert and oriented X 2. No focal neurological deficits. Extremities: Right hip tenderness, stitches noted. Skin: No rashes, lesions or ulcers Psychiatry: Judgement and insight appear normal. Mood & affect appropriate.     Data Reviewed: I have personally reviewed following labs and imaging studies  CBC: Recent Labs  Lab 06/12/22 1417 06/13/22 0212 06/15/22 0331 06/15/22 1316 06/16/22 0308 06/16/22 1335 06/17/22 0941 06/18/22 0755  WBC 14.9*   < > 7.6 7.2 8.0  --  7.3 7.9  NEUTROABS 12.6*  --   --   --   --   --  5.3  --   HGB 11.0*   < > 9.1* 8.0* 7.6* 7.4* 8.0* 8.3*  HCT 34.3*   < > 28.1* 25.4* 24.1* 23.8* 24.9* 26.1*  MCV 95.8   < > 98.6 98.1 99.2  --  98.4 96.0  PLT 229   < > 180 183 181  --  197 240   < > = values in this interval not displayed.   Basic Metabolic Panel: Recent Labs  Lab 06/14/22 1548 06/15/22 0331 06/16/22 0308 06/16/22 0942  06/17/22 0511 06/17/22 0941 06/18/22 0755  NA 136 135 134*  --  133*  --  132*  K 5.4* 5.2* 5.2* 5.0 5.3* 5.1 5.4*  CL 103 101 106  --  104  --  103  CO2 23 24 22   --  20*  --  22  GLUCOSE 213* 204* 169*  --  142*  --  171*  BUN 52* 54* 48*  --  48*  --  44*  CREATININE 2.65* 2.62* 2.36*  --  2.09*  --  1.98*  CALCIUM 8.5* 8.2* 8.0*  --  8.2*  --  8.2*  MG  --  2.2  --   --   --   --  2.6*  PHOS  --   --   --   --   --   --  3.5   GFR: Estimated Creatinine Clearance: 22.8 mL/min (A) (by C-G formula based on SCr of 1.98 mg/dL (H)). Liver Function Tests: Recent Labs  Lab 06/15/22 0331 06/17/22 0511  AST 20 15  ALT 12 9  ALKPHOS 69 66  BILITOT 0.7 0.6  PROT 5.3* 4.9*  ALBUMIN 2.8* 2.6*   No results for input(s): "LIPASE", "AMYLASE" in the last 168 hours. Recent Labs  Lab 06/17/22 0511  AMMONIA 21   Coagulation Profile: No results for input(s): "INR", "PROTIME" in the last 168 hours. Cardiac Enzymes: No results for input(s): "CKTOTAL", "CKMB", "CKMBINDEX", "TROPONINI" in the last 168 hours. BNP (last 3 results) No results for input(s): "PROBNP" in the last 8760 hours. HbA1C: No results for input(s): "HGBA1C" in the last 72 hours. CBG: Recent Labs  Lab 06/17/22 1129 06/17/22 1558 06/17/22 2235 06/18/22 0722 06/18/22 1137  GLUCAP 195* 180* 179* 156* 251*   Lipid Profile: No results for input(s): "CHOL", "HDL", "LDLCALC", "TRIG", "CHOLHDL", "LDLDIRECT" in the last 72 hours. Thyroid Function Tests: No results for input(s): "TSH", "T4TOTAL", "FREET4", "T3FREE", "THYROIDAB" in the last 72 hours. Anemia Panel: No results for input(s): "VITAMINB12", "FOLATE", "FERRITIN", "TIBC", "IRON", "RETICCTPCT" in the last 72 hours. Sepsis Labs: Recent Labs  Lab 06/17/22 0511  LATICACIDVEN 0.6    Recent Results (from the past 240 hour(s))  Surgical PCR screen     Status: Abnormal   Collection Time: 06/14/22 12:45 AM   Specimen: Nasal Mucosa; Nasal Swab  Result Value Ref  Range Status   MRSA, PCR NEGATIVE NEGATIVE Final   Staphylococcus aureus POSITIVE (A) NEGATIVE Final    Comment: (NOTE) The Xpert SA Assay (FDA approved for NASAL specimens in patients 9 years of age and older), is one component of a comprehensive surveillance program. It is not intended to diagnose infection nor to guide or monitor treatment. Performed at [facility], [address] [place], [place] [address]     Radiology Studies: CT HEAD WO CONTRAST ( )  Result Date: 06/17/2022 CLINICAL DATA:  Altered mental status EXAM: CT HEAD WITHOUT CONTRAST TECHNIQUE: Contiguous axial images were obtained from the base of the skull through the vertex without intravenous contrast. RADIATION DOSE REDUCTION: This exam was performed according to the departmental dose-optimization program which includes automated exposure control, adjustment of the mA and/or kV according to patient size and/or use of iterative reconstruction technique. COMPARISON:  05/01/2021 FINDINGS: Brain: There is no mass, hemorrhage or extra-axial collection. There is generalized atrophy without lobar predilection. Hypodensity of the white matter is most commonly associated with chronic microvascular disease. Old posterior left parietal infarct and multiple old basal ganglia small vessel infarcts. Vascular: No abnormal hyperdensity of the major intracranial arteries or dural venous sinuses. No intracranial atherosclerosis. Skull: The visualized skull base, calvarium and extracranial soft tissues are normal. Sinuses/Orbits: No fluid levels or advanced mucosal thickening of the visualized paranasal sinuses. No mastoid or middle ear effusion. The orbits are normal. IMPRESSION: 1. No acute intracranial abnormality. 2. Old posterior left parietal infarct and multiple old basal ganglia small vessel infarcts. 3. Hypodensity of the white matter is most commonly associated with chronic microvascular disease. Electronically Signed   By: Deatra Robinson M.D.   On: 06/17/2022 03:41    Scheduled Meds:  acetaminophen  650 mg Oral Q6H   aspirin EC  81 mg Oral Daily   budesonide (PULMICORT) nebulizer solution  0.25 mg Nebulization BID   Chlorhexidine Gluconate Cloth  6 each Topical Daily   cholecalciferol  2,000 Units Oral Daily   clopidogrel  75 mg Oral Daily   docusate sodium  100 mg Oral BID   feeding supplement (GLUCERNA SHAKE)  237 mL Oral TID BM   fluticasone furoate-vilanterol  1 puff Inhalation Daily   guaiFENesin  600 mg Oral BID   insulin aspart  0-9 Units Subcutaneous TID WC   ipratropium-albuterol  3 mL Nebulization BID   multivitamin with minerals  1 tablet Oral Daily   mupirocin ointment  1 Application Nasal BID   polyethylene glycol  17 g Oral Daily   rosuvastatin  5 mg Oral Daily   sodium bicarbonate  650 mg Oral Daily   tamsulosin  0.4 mg Oral QPM   Continuous Infusions:   LOS: 6 days    Time spent: 35 mins    Willeen Niece, MD [facility]   If 7PM-7AM, please contact night-coverage

## 2022-06-18 NOTE — TOC Progression Note (Signed)
Transition of Care [facility]) - Progression Note    Patient Details  Name: Chelsea [name] MRN: [id number] Date of Birth: 03/04/1945  Transition of Care [facility]) CM/SW Contact  Lorri Frederick, LCSW Phone Number: 06/18/2022, 2:08 PM  Clinical Narrative:   [facility] still pending--[facility] informed MD that they would approve but needed a new PT note, which has been entered.  [facility] can accept over the weekend but will need to fax DC summary to: [phone]      Barriers to Discharge: Continued Medical Work up, SNF Pending bed offer  Expected Discharge Plan and Services In-house Referral: Clinical Social Work   Post Acute Care Choice: Skilled Nursing Facility Living arrangements for the past 2 months: Single Family Home                                       Social Determinants of Health (SDOH) Interventions SDOH Screenings   Food Insecurity: No Food Insecurity (06/13/2022)  Housing: Patient Declined (06/13/2022)  Transportation Needs: No Transportation Needs (06/13/2022)  Utilities: Not At Risk (06/13/2022)  Depression (PHQ2-9): High Risk (09/29/2020)  Tobacco Use: Medium Risk (06/15/2022)    Readmission Risk Interventions    06/27/2020    2:08 PM 12/05/2019    1:40 PM  Readmission Risk Prevention Plan  Transportation Screening Complete Complete  PCP or Specialist Appt within 3-5 Days  Complete  HRI or Home Care Consult Complete Complete  Social Work Consult for Recovery Care Planning/Counseling Complete Complete  Palliative Care Screening Not Applicable Not Applicable  Medication Review Oceanographer)  Complete

## 2022-06-18 NOTE — Plan of Care (Signed)
  Problem: Clinical Measurements: Goal: Ability to maintain clinical measurements within normal limits will improve Outcome: Progressing Goal: Will remain free from infection Outcome: Progressing Goal: Diagnostic test results will improve Outcome: Progressing Goal: Respiratory complications will improve Outcome: Progressing Goal: Cardiovascular complication will be avoided Outcome: Progressing   Problem: Activity: Goal: Risk for activity intolerance will decrease Outcome: Progressing   Problem: Nutrition: Goal: Adequate nutrition will be maintained Outcome: Progressing   Problem: Elimination: Goal: Will not experience complications related to bowel motility Outcome: Progressing   Problem: Pain Managment: Goal: General experience of comfort will improve Outcome: Progressing   Problem: Safety: Goal: Ability to remain free from injury will improve Outcome: Progressing   Problem: Skin Integrity: Goal: Risk for impaired skin integrity will decrease Outcome: Progressing   

## 2022-06-18 NOTE — Plan of Care (Signed)
  Problem: Education: Goal: Knowledge of General Education information will improve Description: Including pain rating scale, medication(s)/side effects and non-pharmacologic comfort measures Outcome: Progressing   Problem: Health Behavior/Discharge Planning: Goal: Ability to manage health-related needs will improve Outcome: Progressing   Problem: Clinical Measurements: Goal: Ability to maintain clinical measurements within normal limits will improve Outcome: Progressing Goal: Will remain free from infection Outcome: Progressing Goal: Diagnostic test results will improve Outcome: Progressing Goal: Respiratory complications will improve Outcome: Progressing Goal: Cardiovascular complication will be avoided Outcome: Progressing   Problem: Activity: Goal: Risk for activity intolerance will decrease Outcome: Progressing   Problem: Nutrition: Goal: Adequate nutrition will be maintained Outcome: Progressing   Problem: Coping: Goal: Level of anxiety will decrease Outcome: Progressing   Problem: Elimination: Goal: Will not experience complications related to bowel motility Outcome: Progressing Goal: Will not experience complications related to urinary retention Outcome: Progressing   Problem: Pain Managment: Goal: General experience of comfort will improve Outcome: Progressing   Problem: Safety: Goal: Ability to remain free from injury will improve Outcome: Progressing   Problem: Skin Integrity: Goal: Risk for impaired skin integrity will decrease Outcome: Progressing   Problem: Education: Goal: Ability to describe self-care measures that may prevent or decrease complications (Diabetes Survival Skills Education) will improve Outcome: Progressing Goal: Individualized Educational Video(s) Outcome: Progressing   Problem: Coping: Goal: Ability to adjust to condition or change in health will improve Outcome: Progressing   Problem: Fluid Volume: Goal: Ability to  maintain a balanced intake and output will improve Outcome: Progressing   Problem: Health Behavior/Discharge Planning: Goal: Ability to identify and utilize available resources and services will improve Outcome: Progressing Goal: Ability to manage health-related needs will improve Outcome: Progressing   Problem: Metabolic: Goal: Ability to maintain appropriate glucose levels will improve Outcome: Progressing   Problem: Nutritional: Goal: Maintenance of adequate nutrition will improve Outcome: Progressing Goal: Progress toward achieving an optimal weight will improve Outcome: Progressing   Problem: Skin Integrity: Goal: Risk for impaired skin integrity will decrease Outcome: Progressing   Problem: Tissue Perfusion: Goal: Adequacy of tissue perfusion will improve Outcome: Progressing   Problem: Education: Goal: Verbalization of understanding the information provided (i.e., activity precautions, restrictions, etc) will improve Outcome: Progressing Goal: Individualized Educational Video(s) Outcome: Progressing   Problem: Activity: Goal: Ability to ambulate and perform ADLs will improve Outcome: Progressing   Problem: Clinical Measurements: Goal: Postoperative complications will be avoided or minimized Outcome: Progressing   Problem: Self-Concept: Goal: Ability to maintain and perform role responsibilities to the fullest extent possible will improve Outcome: Progressing   Problem: Pain Management: Goal: Pain level will decrease Outcome: Progressing   

## 2022-06-18 NOTE — Progress Notes (Signed)
Patient has not had BM since 06/12/22. Patient given scheduled stool softeners orally, as well as PRN suppository with no success. Pt complaining of nausea and administered PRN antiemetic with no relief. Pt stating nausea is coming from not having BM and being distended in abdomen. MD Willeen Niece paged.

## 2022-06-19 ENCOUNTER — Inpatient Hospital Stay (HOSPITAL_COMMUNITY): Payer: PPO

## 2022-06-19 DIAGNOSIS — Z7189 Other specified counseling: Secondary | ICD-10-CM | POA: Diagnosis not present

## 2022-06-19 DIAGNOSIS — Z515 Encounter for palliative care: Secondary | ICD-10-CM

## 2022-06-19 DIAGNOSIS — S72001A Fracture of unspecified part of neck of right femur, initial encounter for closed fracture: Secondary | ICD-10-CM | POA: Diagnosis not present

## 2022-06-19 LAB — GLUCOSE, CAPILLARY
Glucose-Capillary: 201 mg/dL — ABNORMAL HIGH (ref 70–99)
Glucose-Capillary: 150 mg/dL — ABNORMAL HIGH (ref 70–99)
Glucose-Capillary: 108 mg/dL — ABNORMAL HIGH (ref 70–99)
Glucose-Capillary: 109 mg/dL — ABNORMAL HIGH (ref 70–99)

## 2022-06-19 LAB — BASIC METABOLIC PANEL
Anion gap: 10 (ref 5–15)
BUN: 44 mg/dL — ABNORMAL HIGH (ref 8–23)
CO2: 24 mmol/L (ref 22–32)
Calcium: 8.4 mg/dL — ABNORMAL LOW (ref 8.9–10.3)
Chloride: 99 mmol/L (ref 98–111)
Creatinine, Ser: 1.84 mg/dL — ABNORMAL HIGH (ref 0.44–1.00)
GFR, Estimated: 28 mL/min — ABNORMAL LOW (ref >60)
Glucose, Bld: 208 mg/dL — ABNORMAL HIGH (ref 70–99)
Potassium: 5.4 mmol/L — ABNORMAL HIGH (ref 3.5–5.1)
Sodium: 133 mmol/L — ABNORMAL LOW (ref 135–145)

## 2022-06-19 LAB — CBC
HCT: 28.8 % — ABNORMAL LOW (ref 36.0–46.0)
Hemoglobin: 9.3 g/dL — ABNORMAL LOW (ref 12.0–15.0)
MCH: 30.3 pg (ref 26.0–34.0)
MCHC: 32.3 g/dL (ref 30.0–36.0)
MCV: 93.8 fL (ref 80.0–100.0)
Platelets: 283 10*3/uL (ref 150–400)
RBC: 3.07 MIL/uL — ABNORMAL LOW (ref 3.87–5.11)
RDW: 12.3 % (ref 11.5–15.5)
WBC: 13 10*3/uL — ABNORMAL HIGH (ref 4.0–10.5)
nRBC: 0 % (ref 0.0–0.2)

## 2022-06-19 LAB — POTASSIUM: Potassium: 5.7 mmol/L — ABNORMAL HIGH (ref 3.5–5.1)

## 2022-06-19 LAB — MAGNESIUM: Magnesium: 2.8 mg/dL — ABNORMAL HIGH (ref 1.7–2.4)

## 2022-06-19 LAB — PHOSPHORUS: Phosphorus: 3.3 mg/dL (ref 2.5–4.6)

## 2022-06-19 MED ORDER — ACETAMINOPHEN 500 MG PO TABS
1000.0000 mg | ORAL_TABLET | Freq: Three times a day (TID) | ORAL | Status: DC
  Administered 2022-06-19 – 2022-06-20 (×2): 1000 mg via ORAL
  Filled 2022-06-19 (×2): qty 2

## 2022-06-19 MED ORDER — SODIUM CHLORIDE 0.9 % IV SOLN: INTRAVENOUS | Status: DC

## 2022-06-19 MED ORDER — SODIUM ZIRCONIUM CYCLOSILICATE 10 G PO PACK: 10.0000 g | PACK | Freq: Once | ORAL | Status: DC

## 2022-06-19 MED ORDER — IOHEXOL 9 MG/ML PO SOLN
500.0000 mL | ORAL | Status: AC
  Administered 2022-06-19 (×2): 500 mL via ORAL

## 2022-06-19 MED ORDER — FLEET ENEMA 7-19 GM/118ML RE ENEM
1.0000 | ENEMA | Freq: Every day | RECTAL | Status: DC | PRN
  Filled 2022-06-19: qty 1

## 2022-06-19 MED ORDER — DIATRIZOATE MEGLUMINE & SODIUM 66-10 % PO SOLN
90.0000 mL | Freq: Once | ORAL | Status: AC
  Administered 2022-06-19: 90 mL via NASOGASTRIC
  Filled 2022-06-19: qty 90

## 2022-06-19 NOTE — Plan of Care (Signed)
  Problem: Education: Goal: Knowledge of General Education information will improve Description Including pain rating scale, medication(s)/side effects and non-pharmacologic comfort measures  Outcome: Progressing   Problem: Health Behavior/Discharge Planning: Goal: Ability to manage health-related needs will improve Outcome: Progressing   Problem: Clinical Measurements: Goal: Respiratory complications will improve Outcome: Progressing   Problem: Elimination: Goal: Will not experience complications related to bowel motility Outcome: Progressing   

## 2022-06-19 NOTE — Consult Note (Addendum)
Palliative Medicine Inpatient Consult Note  Consulting Provider: Willeen Niece, MD   Reason for consult:   Palliative Care Consult Services Palliative Medicine Consult  Reason for Consult? Discuss goals of care.   06/19/2022  HPI:  Per intake H&P --> This 77 yrs old Female with DM, hx. 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 Admitted for R hip fracture requiring IM nail placement.  The Palliative care team has been asked to get involved for further goals of care conversations.   Clinical Assessment/Goals of Care:  *Please note that this is a verbal dictation therefore any spelling or grammatical errors are due to the "Dragon Medical One" system interpretation.  I have reviewed medical records including EPIC notes, labs and imaging, received report from bedside RN, assessed the patient who is lying in bed, nauseated with a distended abdomen.    I met with Jaanvi and patient's daughters, Chelsea [name] and [name] to further discuss diagnosis prognosis, GOC, EOL wishes, disposition and options.   I introduced Palliative Medicine as specialized medical care for people living with serious illness. It focuses on providing relief from the symptoms and stress of a serious illness. The goal is to improve quality of life for both the patient and the family.  Medical History Review and Understanding:  A discussion of Erine's past medical history inclusive of her prior stroke, stage IV chronic kidney disease, hypertension, and recent fall requiring intramedullary nail placement was held.  Social History:  Jaid shares with me that she is from [place], [place] originally though she now lives in [place], [place] with her daughter Chelsea [name].  She is divorced and has 2 children, 2 grandchildren, and 5 great-grandchildren.  Carlise shares  that she formerly worked in Theatre manager.  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  Functional and Nutritional State:  Preceding hospitalization Mykaila was living with her daughter able to participate in B ADLs with assistance.  She did have a fairly decent appetite preceding hospitalization.  Palliative Symptoms:  Urinary retention-this has been going on postsurgically overnight a bladder scan was completed and a straight catheterization thereafter.  We discussed ordering every 6 hour bladder scans.  Nausea and vomiting-this has been going on for the past 2 days patient has been vomiting bile and able to keep little to nothing down as a result of this.  Constipation-this is also been going on since surgery per family it has been 8 or 9 days since she has had a robust bowel movement.  She did get a suppository yesterday and got 2 small stools and then loose stools though nothing sufficient for the amount of time she has not had a bowel movement.  We discussed based upon her abdominal distention and nausea/vomiting having a CT of her abdomen complete to rule out an obstructive process.  Weakness/deconditioning in the setting of hip fracture will require rehabilitation efforts.  Advance Directives:  A detailed discussion was had today regarding advanced directives.  Patient's daughter, Chelsea [name] is her Social research officer, government.  Code Status:  Concepts specific to code status, artifical feeding and hydration, continued IV antibiotics and rehospitalization was had.  The difference between a aggressive medical intervention path  and a palliative comfort care path for this patient at this time was had.   I completed a MOST form today.  The patient and family outlined their wishes for the following treatment decisions:  Cardiopulmonary Resuscitation: Do Not Attempt Resuscitation (DNR/No CPR)  Medical Interventions: Limited Additional  Interventions: Use medical treatment, IV fluids and cardiac monitoring as indicated, DO NOT USE intubation or mechanical ventilation. May consider use of less invasive airway support such as BiPAP or CPAP. Also provide comfort measures. Transfer to the hospital if indicated. Avoid intensive care.   Antibiotics: Determine use of limitation of antibiotics when infection occurs  IV Fluids: IV fluids for a defined trial period  Feeding Tube: No feeding tube    Patient states she would not want dialysis.  Discussion:  Patient's daughters endorse a variety of concerns to me during my time at bedside.  One of the concerns is her leukocytosis-there is been a jump from yesterday to today.  We discussed various causes of this -urinary retention leading to possible infection, possible perforation, and possible aspiration - I shared that I would allow the primary team to further work her up.  Patient's daughters are very concerned about her overall health state and the possibilities moving forward.  We discussed best case worst-case scenarios.  We discussed if she were to decline further and have conditions which were overwhelming to her body that we could always discuss the idea of comfort care and hospice.  At this time though it does appear that there are things which perhaps could be treated and further workup will help to determine what that treatment path will be.  Patient's family is well aware of Juliet's overall health state and the possibility of her declining.  Kamari herself vocalizes readiness to meet God when it is her time. ____________________________  I was able to meet with patient's daughter, Chelsea [name] outside her room and we discussed a variety of concerns as well as [name]'s overall poor health due to Parkinson's disease, cervical disease, fibromyalgia, among a multitude of other unfortunate circumstances.  Patient's family vocalized the desire to have her worked up further and treat the  treatable.  They would not want Taysia's life prolonged unnecessarily if she had untreatable conditions and/or if her condition/treatments would resulted in a decline in her present quality of life.  Discussed the importance of continued conversation with family and their  medical providers regarding overall plan of care and treatment options, ensuring decisions are within the context of the patients values and GOCs. __________________________________________ AddendumSherron Monday to radiologist regarding possible SBO and alerted primary team.  Assisted patients RN with placement of NGT for decompression.  Spent time speaking with patients family regarding active medical problems.   Additional Time: 63 minutes  Decision Maker: MANUEL,[name] (Daughter): [phone] (Mobile)   SUMMARY OF RECOMMENDATIONS   DNAR/DNI  MOST form completed  Appreciate primary team updated patient and her family  Open and honest conversations held with both Quinn and her daughters about her clinical condition  Goals at this time are for improvements, allowing time for outcomes  TOC - OP Palliative support on discharge  Symptoms as below  Ongoing support  Code Status/Advance Care Planning: DNAR/DNI   Symptom Management:  Urinary retention: - Bladder scan Q6H Straight cath if >  Nausea/Vomiting: - Reglan and zofran ordered - CT abdomen to r/o obstruction  Constipation: - Will need aggressive efforts to clear out her bowel, awaiting CT scan results for additional orders  Weakness: - PT/OT  Leukocytosis: - WU per primary team  Palliative Prophylaxis:  Aspiration, Bowel Regimen, Delirium Protocol, Frequent Pain Assessment, Oral Care,  Palliative Wound Care, and Turn Reposition  Additional Recommendations (Limitations, Scope, Preferences): Continue present care  Psycho-social/Spiritual:  Desire for further Chaplaincy support: Yes Additional Recommendations: Education on present  care   Prognosis: Multiple chronic co-morbidities place patient at a high 12 month mortality risk   Discharge Planning: Discharge to SNF once medically optimized  Vitals:   06/18/22 1933 06/19/22 0447  BP: (!) 148/63 136/65  Pulse: 67 81  Resp: 20 17  Temp: 97.7 F (36.5 C) 98.2 F (36.8 C)  SpO2: 100% 99%    Intake/Output Summary (Last 24 hours) at 06/19/2022 1610 Last data filed at 06/19/2022 0315 Gross per 24 hour  Intake 360 ml  Output 1342 ml  Net -982 ml   Last Weight  Most recent update: 06/14/2022  9:35 AM    Weight  69.4 kg (153 lb)            Gen: Elderly Caucasian F in NAD HEENT: moist mucous membranes CV: Regular rate and rhythm  PULM:  On 2LPM [place], breathing nonlabored ABD: Distended w/hypoactive BS, tender to palpation EXT: No edema  Neuro: Alert and oriented x3   PPS: 30%   This conversation/these recommendations were discussed with patient primary care team, Dr. Idelle Leech  Total Time: 21 Billing based on MDM: High  Problems Addressed: One acute or chronic illness or injury that poses a threat to life or bodily function  Amount and/or Complexity of Data: Category 3:Discussion of management or test interpretation with external physician/other qualified health care professional/appropriate source (not separately reported)  Risks: Decision not to resuscitate or to de-escalate care because of poor prognosis ______________________________________________________ Lamarr Lulas [place] Palliative Medicine Team Team Cell Phone: [phone] Please utilize secure chat with additional questions, if there is no response within 30 minutes please call the above phone number  Palliative Medicine Team providers are available by phone from 7am to 7pm daily and can be reached through the team cell phone.  Should this patient require assistance outside of these hours, please call the patient's attending physician.

## 2022-06-19 NOTE — Progress Notes (Signed)
Patient having a bout of green emesis this afternoon. Also retaining urine and was straight cathed for out. MD Idelle Leech made aware and gave OK to hold all meds until abdominal CT scan results are back. Patient received IV reglan with some relief.

## 2022-06-19 NOTE — Consult Note (Signed)
Consulting Physician: Hyman Hopes Nazar Kuan  Referring Provider: Dr. Idelle Leech  Chief Complaint: I fell and broke my hip  Reason for Consult: Ileus vs. Small bowel obstruction   Subjective   HPI: Chelsea [name] is an 77 y.o. female who is here for a broken hip.  She underwent IM nailing of the right hip with Dr. Carola Frost on 06/14/22.  Postoperatively she has had bowel movements, including some yesterday, but has become bloated with abdominal pain, nausea and vomiting.  A CT was completed today with concern for small bowel obstruction without specific transition point vs. Ileus.  Past Medical History:  Diagnosis Date   Allergy    Anemia    Blind left eye    Cardiac arrest [facility])    Carotid artery occlusion    Cataract    Depression    Diabetes mellitus without complication (HCC)    Hyperlipidemia    Hypertension    Oxygen deficiency    Stroke [facility])     Past Surgical History:  Procedure Laterality Date   ENDARTERECTOMY Left 12/03/2019   Procedure: LEFT CAROTID ENDARTERECTOMY;  Surgeon: Chuck Hint, MD;  Location[facility] OR;  Service: Vascular;  Laterality: Left;   INTRAMEDULLARY (IM) NAIL INTERTROCHANTERIC Right 06/14/2022   Procedure: INTRAMEDULLARY (IM) NAIL INTERTROCHANTERIC, REPAIR OF RIGHT HIP FRACTURE;  Surgeon: Myrene Galas, MD;  Location: [facility] OR;  Service: Orthopedics;  Laterality: Right;   PATCH ANGIOPLASTY Left 12/03/2019   Procedure: PATCH ANGIOPLASTY Left Carotid Artery;  Surgeon: Chuck Hint, MD;  Location: [facility] OR;  Service: Vascular;  Laterality: Left;    Family History  Problem Relation Age of Onset   Hypertension Mother    Hyperlipidemia Mother    Diabetes Mother    Cancer Mother    Arthritis Mother    Heart attack Mother    Hypertension Father    Hyperlipidemia Father    Diabetes Father    Arthritis Father    Stroke Father    Hypertension Sister    Heart disease Sister    Heart attack Sister    Diabetes Sister    Depression Sister     COPD Sister    Arthritis Sister    Stroke Brother    Hypertension Brother    Heart disease Brother    Diabetes Brother    Depression Brother    Arthritis Brother    Stroke Daughter    Hypertension Daughter    Diabetes Daughter    Depression Daughter    Arthritis Daughter    Diabetes Son    Depression Son     Social:  reports that she has quit smoking. Her smoking use included cigarettes. She has never used smokeless tobacco. She reports that she does not currently use alcohol. She reports that she does not use drugs.  Allergies:  Allergies  Allergen Reactions   Codeine Anaphylaxis and Swelling    Throat swells     Latex Anaphylaxis   Penicillins Anaphylaxis and Swelling    Throat closes   Tolerated Cephalosporin Date: 06/14/22.     Sulfa Antibiotics Anaphylaxis, Other (See Comments) and Swelling    Lips and eye swell   Benzodiazepines Hives   Cephalosporins Other (See Comments)    Tolerated Cephalosporin Date: 06/14/22.     Lidocaine Hives   Oxycodone-Acetaminophen Palpitations    Other Reaction(s): Other (See Comments)  unknown   Trazodone Other (See Comments)    Sweating  Other Reaction(s): Other (See Comments)  unknown   Atorvastatin Nausea  And Vomiting    Vomiting  Other Reaction(s): GI Intolerance  Vomiting    Cephalexin Itching    Tolerating rocephin 06/25/20  Tolerated Cephalosporin Date: 06/14/22.    Other Reaction(s): Other (See Comments)  unknown   Gabapentin Nausea And Vomiting    Other Reaction(s): GI Intolerance   Pregabalin Nausea And Vomiting    Vomiting  Other Reaction(s): GI Intolerance  Vomiting     Medications: Current Outpatient Medications  Medication Instructions   acetaminophen (TYLENOL) 1,000 mg, Oral, Every 6 hours PRN   acetaminophen (TYLENOL) 325-650 mg, Oral, Every 6 hours PRN   carvedilol (COREG) 3.125 MG tablet TAKE ONE TABLET TWICE DAILY WITH MEAL(S)   clopidogrel (PLAVIX) 75 MG tablet TAKE 1 TABLET ONCE  DAILY   feeding supplement, GLUCERNA SHAKE, (GLUCERNA SHAKE) LIQD 237 mLs, Oral, 3 times daily between meals   guaiFENesin (MUCINEX) 600 mg, Oral, 2 times daily   HYDROcodone-acetaminophen (NORCO/VICODIN) 5-325 MG tablet 1 tablet, Oral, Every 6 hours PRN   Iron 325 mg, Oral, Every other day   KERENDIA 10 MG TABS 1 tablet, Oral, Daily   Multiple Vitamin (MULTIVITAMIN WITH MINERALS) TABS tablet 1 tablet, Oral, Daily   olmesartan (BENICAR) 10 mg, Oral, Daily   Polyethyl Glycol-Propyl Glycol (SYSTANE FREE OP) 1 drop, Both Eyes, Daily   rosuvastatin (CRESTOR) 5 MG tablet TAKE ONE TABLET EVERY OTHER DAY FOR FOURTEEN DAYS, THEN ONE DAILY   tamsulosin (FLOMAX) 0.4 mg, Oral, Every evening   torsemide (DEMADEX) 20 mg, Oral, Daily, Takes 40 mg if swelling   vitamin D3 (CHOLECALCIFEROL) 2,000 Units, Oral, Daily    ROS - all of the below systems have been reviewed with the patient and positives are indicated with bold text General: chills, fever or night sweats Eyes: blurry vision or double vision ENT: epistaxis or sore throat Allergy/Immunology: itchy/watery eyes or nasal congestion Hematologic/Lymphatic: bleeding problems, blood clots or swollen lymph nodes Endocrine: temperature intolerance or unexpected weight changes Breast: new or changing breast lumps or nipple discharge Resp: cough, shortness of breath, or wheezing CV: chest pain or dyspnea on exertion GI: as per HPI GU: dysuria, trouble voiding, or hematuria MSK: joint pain or joint stiffness Neuro: TIA or stroke symptoms Derm: pruritus and skin lesion changes Psych: anxiety and depression  Objective   PE Blood pressure 121/60, pulse 81, temperature 97.7 F (36.5 C), temperature source Oral, resp. rate 17, height 5\' 7"  (1.702 m), weight 69.4 kg, SpO2 97 %. Constitutional: NAD; conversant; no deformities Eyes: Moist conjunctiva; no lid lag; anicteric; PERRL Neck: Trachea midline; no thyromegaly Lungs: Normal respiratory effort; no  tactile fremitus CV: RRR; no palpable thrills; no pitting edema GI: Abd Distended, nontender; no palpable hepatosplenomegaly MSK: Normal range of motion of extremities; no clubbing/cyanosis Psychiatric: Appropriate affect; alert and oriented x3 Lymphatic: No palpable cervical or axillary lymphadenopathy  Results for orders placed or performed during the hospital encounter of 06/12/22 (from the past 24 hour(s))  Glucose, capillary     Status: Abnormal   Collection Time: 06/18/22  4:09 PM  Result Value Ref Range   Glucose-Capillary 176 (H) 70 - 99 mg/dL  Glucose, capillary     Status: Abnormal   Collection Time: 06/18/22  7:36 PM  Result Value Ref Range   Glucose-Capillary 203 (H) 70 - 99 mg/dL  Potassium     Status: Abnormal   Collection Time: 06/19/22  1:16 AM  Result Value Ref Range   Potassium 5.7 (H) 3.5 - 5.1 mmol/L  CBC  Status: Abnormal   Collection Time: 06/19/22  1:21 AM  Result Value Ref Range   WBC 13.0 (H) 4.0 - 10.5 K/uL   RBC 3.07 (L) 3.87 - 5.11 MIL/uL   Hemoglobin 9.3 (L) 12.0 - 15.0 g/dL   HCT 60.4 (L) 54.0 - 98.1 %   MCV 93.8 80.0 - 100.0 fL   MCH 30.3 26.0 - 34.0 pg   MCHC 32.3 30.0 - 36.0 g/dL   RDW 19.1 47.8 - 29.5 %   Platelets 283 150 - 400 K/uL   nRBC 0.0 0.0 - 0.2 %  Magnesium     Status: Abnormal   Collection Time: 06/19/22  1:21 AM  Result Value Ref Range   Magnesium 2.8 (H) 1.7 - 2.4 mg/dL  Basic metabolic panel     Status: Abnormal   Collection Time: 06/19/22  1:21 AM  Result Value Ref Range   Sodium 133 (L) 135 - 145 mmol/L   Potassium 5.4 (H) 3.5 - 5.1 mmol/L   Chloride 99 98 - 111 mmol/L   CO2 24 22 - 32 mmol/L   Glucose, Bld 208 (H) 70 - 99 mg/dL   BUN 44 (H) 8 - 23 mg/dL   Creatinine, Ser 6.21 (H) 0.44 - 1.00 mg/dL   Calcium 8.4 (L) 8.9 - 10.3 mg/dL   GFR, Estimated 28 (L) >60 mL/min   Anion gap 10 5 - 15  Phosphorus     Status: None   Collection Time: 06/19/22  1:21 AM  Result Value Ref Range   Phosphorus 3.3 2.5 - 4.6 mg/dL   Glucose, capillary     Status: Abnormal   Collection Time: 06/19/22  8:00 AM  Result Value Ref Range   Glucose-Capillary 201 (H) 70 - 99 mg/dL  Glucose, capillary     Status: Abnormal   Collection Time: 06/19/22 11:21 AM  Result Value Ref Range   Glucose-Capillary 150 (H) 70 - 99 mg/dL    Imaging Orders         DG Hip Port Unilat W or Wo Pelvis 1 View Right         DG Chest 1 View         DG Knee Right Port         DG FEMUR, MIN 2 VIEWS RIGHT         DG C-Arm 1-60 Min-No Report         DG C-Arm 1-60 Min-No Report         DG HIP UNILAT WITH PELVIS 2-3 VIEWS RIGHT         DG Chest Port 1 View         CT HEAD WO CONTRAST ( )         CT ABDOMEN PELVIS WO CONTRAST     1. Dilated stomach and duodenum with dilated proximal small bowel and some edema in the mesentery of left upper quadrant jejunal loops. While no discrete or abrupt transition zone is evident, distal ileal loops in the right lower quadrant are decompressed. Imaging features are suspicious for small bowel obstruction. No small bowel wall thickening or evidence of pneumatosis. 2. Trace free fluid adjacent to the liver. 3. Tiny right pleural effusion with dependent atelectasis in both lower lobes. 4.  Aortic Atherosclerosis (ICD10-I70.0).    Assessment and Plan   Chelsea [name] is an 77 y.o. female s/p IM nailing of right hip on 06/14/22, now experiencing abdominal distention, pain, nausea and vomiting, with findings of a small bowel obstruction  vs. Ileus on CT.     She has no abdominal surgical history.  She had a bad ileus after a stroke.  My impression of the CT is that this is more of an ileus than a bowel obstruction.  I recommend conservative treatment with NG tube decompression and bowel rest.  Correcting electrolyte abnormalities, ambulation and time for recuperation from recent surgery should help improve her bowel function.  We can start the small bowel obstruction protocol today which also may be therapeutic to  help improve motility.    Quentin Ore, MD  [facility] Use [email or web address] to contact on call provider  New Patient Billing: [id number] - High MDM

## 2022-06-19 NOTE — Care Management (Signed)
Received call from [phone] [facility] w HTA re: SNF [facility] [id number] approved for 10 days with 5 days to complete transfer, notified D Duffy CSW via secure chat to facilitate.

## 2022-06-19 NOTE — Progress Notes (Signed)
PROGRESS NOTE    Chelsea [name]  ZOX:[id number] DOB: 1945-03-04 DOA: 06/12/2022  PCP: Trisha Mangle, FNP   Brief Narrative:  This 77 yrs old Female with DM, hx. CVA with residual vision impairment and Right sided hemiparesis, uses walker at baseline, lives at home, HLD, CKD IV with baseline creatinine 1.9-2.1, PVD s/p CEA 2021, and uncontrolled HTN who presented s/p mechanical fall. In the ER, radiograph showed RIGHT hip fracture.  Orthopedic consulted.  Patient underwent IM nail by Dr. Sherilyn Cooter.  Creatinine spiked to 2.8 postoperatively.  Started on IV hydration.  PT and OT recommended CIR which is denied,  awaiting SNF placement.  Patient has not had a bowel movement last 1 week.  Started on aggressive bowel regimen for constipation.  Assessment & Plan:   Principal Problem:   Hip fracture, right, closed, initial encounter (HCC) Active Problems:   AKI (acute kidney injury) on CKD IV   Controlled type 2 diabetes mellitus with hyperglycemia, without long-term current use of insulin (HCC)   Essential hypertension   CKD (chronic kidney disease), stage IV (HCC)   Malnutrition of moderate degree   History of CVA (cerebrovascular accident)   Pure hypercholesterolemia   Fall   PVD (peripheral vascular disease) (HCC)   Wheezing   Hyperkalemia  Right hip fracture, closed: S/p INTRAMEDULLARY (IM) NAIL INTERTROCHANTERIC, REPAIR OF RIGHT HIP FRACTURE 6/3 by Dr. Carola Frost - WBAT R leg with walker. - Unrestricted ROM - Analgesia per ortho - DVT PPx SCDs and Plavix. PT and OT recommended SNF     AKI (acute kidney injury) on CKD IV: Baseline creatinine 2-2.2, spiked up to 2.8  down to 1.84 today with fluids. IV fluid discontinued.  Serum creatinine back to baseline. Avoid nephrotoxins. Hold olmesartan, torsemide Avoid hypotension Hold finerenone, Coreg due to low BP   Hyperkalemia: Due to AKI, mild, better today. Continue to monitor serum creatinine, potassium. Lokelma given x 1.  Recheck potassium   Wheezing: Former smoker but no prior dx. of CHF or COPD. Patient was prescribed supplemental oxygen after her stroke many years ago but never uses it at home, family confirmed that she does use oxygen at home as needed, she is not using alcohol.  Echo here with grade II DD, normal valves, normal EF.   Continue ICS/LABA, started here, no need to continue at d/c   PVD (peripheral vascular disease) (HCC) Continue aspirin, Crestor, Plavix Hold Coreg   Fall: PT and OT evaluation. Insurance authorization approved.   Pure hypercholesterolemia Continue Crestor   History of CVA (cerebrovascular accident) - Continue aspirin, Plavix - Continue Crestor   Malnutrition of moderate degree. As evidenced by loss of subcutaneous muscle mass and fat diffusely   CKD (chronic kidney disease), stage IV (HCC) Cr baseline 2.0-2.2, see above  - Hold finerenone until BP improves.   Essential hypertension BP low post-op, improved with fluids. Check orthostatic blood pressure. - Repeat fluids - Hold Coreg, finerenone - Hold ARB due to AKI - Hold torsemide    Controlled type 2 diabetes mellitus with hyperglycemia, without long-term current use of insulin (HCC) A1c 8.1%.  Diet controlled at baseline. Glucoses fairly good here. Continue SS correction.  Constipation: Patient has developed severe constipation, has not had a bowel movement. Started on aggressive bowel regimen. Patient also feels nauseous.  CT abdominal pelvis ordered to rule out SBO.    DVT prophylaxis: SCDs Code Status: DNR Family Communication: Family at bed side. Disposition Plan:     Status is: Inpatient Remains  inpatient appropriate because: Admitted  s/p mechanical fall with right hip fracture s/p ORIF.  Now has developed AKI on CKD.   Renal functions improving.   PT recommended SNF.Marland Kitchen Peer to peer review completed.  Patient needs updated PT note for approval. Now developed by abdominal distention  secondary to constipation.   Consultants:  Orthopeadics  Procedures: ORIF  Antimicrobials:  Anti-infectives (From admission, onward)    Start     Dose/Rate Route Frequency Ordered Stop   06/14/22 1200  ceFAZolin (ANCEF) IVPB 2g/100 mL premix       Note to Pharmacy: Dr. Carola Frost reviewed allergies, wants Ancef to be given   2 g 200 mL/hr over 30 Minutes Intravenous  Once 06/14/22 1147 06/14/22 1304      Subjective: Patient was seen and examined at bedside.  Overnight events noted. Patient seems improved.  Back to baseline mental status. Renal functions are improving, has not had a bowel movement in last 7 days. She is awaiting SNF placement.  Objective: Vitals:   06/18/22 1933 06/19/22 0447 06/19/22 0758 06/19/22 0850  BP: (!) 148/63 136/65 (!) 176/70 121/60  Pulse: 67 81 81   Resp: 20 17    Temp: 97.7 F (36.5 C) 98.2 F (36.8 C) 97.7 F (36.5 C)   TempSrc:   Oral   SpO2: 100% 99% 97%   Weight:      Height:        Intake/Output Summary (Last 24 hours) at 06/19/2022 1305 Last data filed at 06/19/2022 1213 Gross per 24 hour  Intake 120 ml  Output 1142 ml  Net -1022 ml   Filed Weights   06/12/22 1340 06/14/22 0934  Weight: 69.4 kg 69.4 kg    Examination:  General exam: Appears comfortable, deconditioned, not in any acute distress. Respiratory system: CTA bilaterally . Respiratory effort normal. RR 14 Cardiovascular system: S1 & S2 heard, regular rate and rhythm, no murmur. Gastrointestinal system: Abdomen is soft, non tender, mildly distended, bowel sounds present. Central nervous system: Alert and oriented X 2. No focal neurological deficits. Extremities: Right hip tenderness, stitches noted. Skin: No rashes, lesions or ulcers Psychiatry: Judgement and insight appear normal. Mood & affect appropriate.     Data Reviewed: I have personally reviewed following labs and imaging studies  CBC: Recent Labs  Lab 06/12/22 1417 06/13/22 0212 06/15/22 1316  06/16/22 0308 06/16/22 1335 06/17/22 0941 06/18/22 0755 06/19/22 0121  WBC 14.9*   < > 7.2 8.0  --  7.3 7.9 13.0*  NEUTROABS 12.6*  --   --   --   --  5.3  --   --   HGB 11.0*   < > 8.0* 7.6* 7.4* 8.0* 8.3* 9.3*  HCT 34.3*   < > 25.4* 24.1* 23.8* 24.9* 26.1* 28.8*  MCV 95.8   < > 98.1 99.2  --  98.4 96.0 93.8  PLT 229   < > 183 181  --  197 240 283   < > = values in this interval not displayed.   Basic Metabolic Panel: Recent Labs  Lab 06/15/22 0331 06/16/22 0308 06/16/22 1610 06/17/22 0511 06/17/22 0941 06/18/22 0755 06/18/22 1334 06/19/22 0116 06/19/22 0121  NA 135 134*  --  133*  --  132*  --   --  133*  K 5.2* 5.2*   < > 5.3* 5.1 5.4* 5.7* 5.7* 5.4*  CL 101 106  --  104  --  103  --   --  99  CO2 24 22  --  20*  --  22  --   --  24  GLUCOSE 204* 169*  --  142*  --  171*  --   --  208*  BUN 54* 48*  --  48*  --  44*  --   --  44*  CREATININE 2.62* 2.36*  --  2.09*  --  1.98*  --   --  1.84*  CALCIUM 8.2* 8.0*  --  8.2*  --  8.2*  --   --  8.4*  MG 2.2  --   --   --   --  2.6*  --   --  2.8*  PHOS  --   --   --   --   --  3.5  --   --  3.3   < > = values in this interval not displayed.   GFR: Estimated Creatinine Clearance: 24.5 mL/min (A) (by C-G formula based on SCr of 1.84 mg/dL (H)). Liver Function Tests: Recent Labs  Lab 06/15/22 0331 06/17/22 0511  AST 20 15  ALT 12 9  ALKPHOS 69 66  BILITOT 0.7 0.6  PROT 5.3* 4.9*  ALBUMIN 2.8* 2.6*   No results for input(s): "LIPASE", "AMYLASE" in the last 168 hours. Recent Labs  Lab 06/17/22 0511  AMMONIA 21   Coagulation Profile: No results for input(s): "INR", "PROTIME" in the last 168 hours. Cardiac Enzymes: No results for input(s): "CKTOTAL", "CKMB", "CKMBINDEX", "TROPONINI" in the last 168 hours. BNP (last 3 results) No results for input(s): "PROBNP" in the last 8760 hours. HbA1C: No results for input(s): "HGBA1C" in the last 72 hours. CBG: Recent Labs  Lab 06/18/22 1137 06/18/22 1609  06/18/22 1936 06/19/22 0800 06/19/22 1121  GLUCAP 251* 176* 203* 201* 150*   Lipid Profile: No results for input(s): "CHOL", "HDL", "LDLCALC", "TRIG", "CHOLHDL", "LDLDIRECT" in the last 72 hours. Thyroid Function Tests: No results for input(s): "TSH", "T4TOTAL", "FREET4", "T3FREE", "THYROIDAB" in the last 72 hours. Anemia Panel: No results for input(s): "VITAMINB12", "FOLATE", "FERRITIN", "TIBC", "IRON", "RETICCTPCT" in the last 72 hours. Sepsis Labs: Recent Labs  Lab 06/17/22 0511  LATICACIDVEN 0.6    Recent Results (from the past 240 hour(s))  Surgical PCR screen     Status: Abnormal   Collection Time: 06/14/22 12:45 AM   Specimen: Nasal Mucosa; Nasal Swab  Result Value Ref Range Status   MRSA, PCR NEGATIVE NEGATIVE Final   Staphylococcus aureus POSITIVE (A) NEGATIVE Final    Comment: (NOTE) The Xpert SA Assay (FDA approved for NASAL specimens in patients 6 years of age and older), is one component of a comprehensive surveillance program. It is not intended to diagnose infection nor to guide or monitor treatment. Performed at [facility], [address], [place], [place] [address]     Radiology Studies: No results found.  Scheduled Meds:  acetaminophen  1,000 mg Oral TID   aspirin EC  81 mg Oral Daily   budesonide (PULMICORT) nebulizer solution  0.25 mg Nebulization BID   Chlorhexidine Gluconate Cloth  6 each Topical Daily   cholecalciferol  2,000 Units Oral Daily   clopidogrel  75 mg Oral Daily   docusate sodium  100 mg Oral BID   feeding supplement (GLUCERNA SHAKE)  237 mL Oral TID BM   fluticasone furoate-vilanterol  1 puff Inhalation Daily   guaiFENesin  600 mg Oral BID   insulin aspart  0-9 Units Subcutaneous TID WC   ipratropium-albuterol  3 mL Nebulization BID   multivitamin  with minerals  1 tablet Oral Daily   polyethylene glycol  17 g Oral Daily   rosuvastatin  5 mg Oral Daily   sodium bicarbonate  650 mg Oral Daily   sodium zirconium  cyclosilicate  10 g Oral Once   tamsulosin  0.4 mg Oral QPM   Continuous Infusions:   LOS: 7 days    Time spent: 35 mins    Willeen Niece, MD [facility]   If 7PM-7AM, please contact night-coverage

## 2022-06-19 NOTE — Progress Notes (Signed)
   06/19/22 0315  Gastrointestinal  Gastrointestinal (WDL) WDL  Abdomen Inspection Distended;Taut (Firm)  Bowel Sounds Assessment Active  Tenderness Tender  Last BM Date  06/19/22  Passing Flatus Yes  GI Symptoms Constipation  Nausea Precipitating Factors Other (Comment) (constipation)  Nausea relieved by Nothing  Urine Characteristics  Urinary Incontinence Yes  Urine Color Yellow/straw  Urine Appearance Clear  Urinary Interventions Bladder scan;Intermittent/Straight cath  Bladder Scan Volume (mL) 721 mL  Intermittent/Straight Cath (mL) 800 mL  Intermittent Catheter Size 16  Post Void Cath Residual (mL) 42 mL  Hygiene Peri care

## 2022-06-20 ENCOUNTER — Inpatient Hospital Stay (HOSPITAL_COMMUNITY): Payer: PPO

## 2022-06-20 DIAGNOSIS — S72001A Fracture of unspecified part of neck of right femur, initial encounter for closed fracture: Secondary | ICD-10-CM | POA: Diagnosis not present

## 2022-06-20 DIAGNOSIS — E1165 Type 2 diabetes mellitus with hyperglycemia: Secondary | ICD-10-CM | POA: Diagnosis not present

## 2022-06-20 DIAGNOSIS — N179 Acute kidney failure, unspecified: Secondary | ICD-10-CM | POA: Diagnosis not present

## 2022-06-20 DIAGNOSIS — N184 Chronic kidney disease, stage 4 (severe): Secondary | ICD-10-CM | POA: Diagnosis not present

## 2022-06-20 DIAGNOSIS — K567 Ileus, unspecified: Secondary | ICD-10-CM | POA: Insufficient documentation

## 2022-06-20 LAB — COMPREHENSIVE METABOLIC PANEL
ALT: 18 U/L (ref 0–44)
AST: 27 U/L (ref 15–41)
Albumin: 2.7 g/dL — ABNORMAL LOW (ref 3.5–5.0)
Alkaline Phosphatase: 76 U/L (ref 38–126)
Anion gap: 8 (ref 5–15)
BUN: 40 mg/dL — ABNORMAL HIGH (ref 8–23)
CO2: 26 mmol/L (ref 22–32)
Calcium: 8.3 mg/dL — ABNORMAL LOW (ref 8.9–10.3)
Chloride: 104 mmol/L (ref 98–111)
Creatinine, Ser: 1.8 mg/dL — ABNORMAL HIGH (ref 0.44–1.00)
GFR, Estimated: 28 mL/min — ABNORMAL LOW (ref >60)
Glucose, Bld: 145 mg/dL — ABNORMAL HIGH (ref 70–99)
Potassium: 4.9 mmol/L (ref 3.5–5.1)
Sodium: 138 mmol/L (ref 135–145)
Total Bilirubin: 1.1 mg/dL (ref 0.3–1.2)
Total Protein: 5.7 g/dL — ABNORMAL LOW (ref 6.5–8.1)

## 2022-06-20 LAB — CBC
HCT: 26.7 % — ABNORMAL LOW (ref 36.0–46.0)
Hemoglobin: 8.6 g/dL — ABNORMAL LOW (ref 12.0–15.0)
MCH: 31.7 pg (ref 26.0–34.0)
MCHC: 32.2 g/dL (ref 30.0–36.0)
MCV: 98.5 fL (ref 80.0–100.0)
Platelets: 336 10*3/uL (ref 150–400)
RBC: 2.71 MIL/uL — ABNORMAL LOW (ref 3.87–5.11)
RDW: 12.4 % (ref 11.5–15.5)
WBC: 10.6 10*3/uL — ABNORMAL HIGH (ref 4.0–10.5)
nRBC: 0 % (ref 0.0–0.2)

## 2022-06-20 LAB — GLUCOSE, CAPILLARY
Glucose-Capillary: 135 mg/dL — ABNORMAL HIGH (ref 70–99)
Glucose-Capillary: 130 mg/dL — ABNORMAL HIGH (ref 70–99)
Glucose-Capillary: 88 mg/dL (ref 70–99)
Glucose-Capillary: 127 mg/dL — ABNORMAL HIGH (ref 70–99)

## 2022-06-20 LAB — MAGNESIUM: Magnesium: 2.6 mg/dL — ABNORMAL HIGH (ref 1.7–2.4)

## 2022-06-20 MED ORDER — ACETAMINOPHEN 160 MG/5ML PO SOLN
1000.0000 mg | Freq: Three times a day (TID) | ORAL | Status: DC
  Administered 2022-06-20 – 2022-06-22 (×7): 1000 mg
  Filled 2022-06-20 (×8): qty 40.6

## 2022-06-20 MED ORDER — SODIUM BICARBONATE 650 MG PO TABS
650.0000 mg | ORAL_TABLET | Freq: Every day | ORAL | Status: DC
  Administered 2022-06-21 – 2022-06-22 (×2): 650 mg
  Filled 2022-06-20 (×2): qty 1

## 2022-06-20 MED ORDER — GUAIFENESIN 100 MG/5ML PO LIQD: 5.0000 mL | ORAL | Status: DC | PRN

## 2022-06-20 MED ORDER — CLOPIDOGREL BISULFATE 75 MG PO TABS
75.0000 mg | ORAL_TABLET | Freq: Every day | ORAL | Status: DC
  Administered 2022-06-21 – 2022-06-22 (×2): 75 mg
  Filled 2022-06-20 (×2): qty 1

## 2022-06-20 MED ORDER — CARVEDILOL 3.125 MG PO TABS
3.1250 mg | ORAL_TABLET | Freq: Two times a day (BID) | ORAL | Status: DC
  Administered 2022-06-20 – 2022-06-22 (×5): 3.125 mg
  Filled 2022-06-20 (×5): qty 1

## 2022-06-20 MED ORDER — DOCUSATE SODIUM 50 MG/5ML PO LIQD
100.0000 mg | Freq: Two times a day (BID) | ORAL | Status: DC
  Administered 2022-06-20 – 2022-06-22 (×5): 100 mg
  Filled 2022-06-20 (×7): qty 10

## 2022-06-20 MED ORDER — ASPIRIN 81 MG PO CHEW
81.0000 mg | CHEWABLE_TABLET | Freq: Every day | ORAL | Status: DC
  Administered 2022-06-21 – 2022-06-22 (×2): 81 mg
  Filled 2022-06-20 (×2): qty 1

## 2022-06-20 MED ORDER — VITAMIN D 25 MCG (1000 UNIT) PO TABS
2000.0000 [IU] | ORAL_TABLET | Freq: Every day | ORAL | Status: DC
  Administered 2022-06-21 – 2022-06-25 (×5): 2000 [IU]
  Filled 2022-06-20 (×5): qty 2

## 2022-06-20 MED ORDER — INSULIN ASPART 100 UNIT/ML IJ SOLN
0.0000 [IU] | INTRAMUSCULAR | Status: DC
  Administered 2022-06-20 – 2022-06-21 (×2): 1 [IU] via SUBCUTANEOUS
  Administered 2022-06-21: 2 [IU] via SUBCUTANEOUS
  Administered 2022-06-21 – 2022-06-22 (×5): 1 [IU] via SUBCUTANEOUS
  Administered 2022-06-22 – 2022-06-23 (×2): 2 [IU] via SUBCUTANEOUS
  Administered 2022-06-23 (×2): 1 [IU] via SUBCUTANEOUS
  Administered 2022-06-23: 2 [IU] via SUBCUTANEOUS
  Administered 2022-06-23: 3 [IU] via SUBCUTANEOUS
  Administered 2022-06-24: 2 [IU] via SUBCUTANEOUS
  Administered 2022-06-24 (×2): 1 [IU] via SUBCUTANEOUS
  Administered 2022-06-24: 5 [IU] via SUBCUTANEOUS
  Administered 2022-06-25: 2 [IU] via SUBCUTANEOUS

## 2022-06-20 MED ORDER — FINERENONE 10 MG PO TABS
1.0000 | ORAL_TABLET | Freq: Every day | ORAL | Status: DC
  Administered 2022-06-20 – 2022-06-25 (×6): 10 mg via ORAL
  Filled 2022-06-20 (×6): qty 1

## 2022-06-20 NOTE — Assessment & Plan Note (Signed)
No BM since surgery, now developed distension, nausea in the last 48 hours.  Gen Surg consulted 6/8, CT showed ileus, NG placed Symtoms improving - Maintain NG - Appreciate Gen Surg expertise

## 2022-06-20 NOTE — Progress Notes (Signed)
6 Days Post-Op  Subjective: CC: Still feels bloated/distended but better after NGT placement. No abdominal pain this am. 1.85L/24 hours out from NGT, dark bilious. Small amount of flatus and small streak of stool last night. Xray pending.   Hx of abdominal hysterectomy in the past.   Objective: Vital signs in last 24 hours: Temp:  [98.2 F (36.8 C)-99.2 F (37.3 C)] 99.2 F (37.3 C) (06/09 0709) Pulse Rate:  [73-79] 78 (06/09 0709) Resp:  [17-18] 18 (06/09 0709) BP: (143-158)/(55-69) 150/64 (06/09 0709) SpO2:  [95 %-98 %] 96 % (06/09 0709) Last BM Date : 06/20/22  Intake/Output from previous day: 06/08 0701 - 06/09 0700 In: 800.1 [I.V.:650.1; NG/GT:150] Out: 3250 [Urine:1400; Emesis/NG output:1850] Intake/Output this shift: No intake/output data recorded.  PE: Gen:  Alert, NAD, pleasant Abd: Mild to moderate distension but very soft and completely NT. Hypoactive BS. NGT in place w/ thick dark output.   Lab Results:  Recent Labs    06/18/22 0755 06/19/22 0121  WBC 7.9 13.0*  HGB 8.3* 9.3*  HCT 26.1* 28.8*  PLT 240 283   BMET Recent Labs    06/18/22 0755 06/18/22 1334 06/19/22 0116 06/19/22 0121  NA 132*  --   --  133*  K 5.4*   < > 5.7* 5.4*  CL 103  --   --  99  CO2 22  --   --  24  GLUCOSE 171*  --   --  208*  BUN 44*  --   --  44*  CREATININE 1.98*  --   --  1.84*  CALCIUM 8.2*  --   --  8.4*   < > = values in this interval not displayed.   PT/INR No results for input(s): "LABPROT", "INR" in the last 72 hours. CMP     Component Value Date/Time   NA 133 (L) 06/19/2022 0121   NA 138 07/21/2020 1519   K 5.4 (H) 06/19/2022 0121   CL 99 06/19/2022 0121   CO2 24 06/19/2022 0121   GLUCOSE 208 (H) 06/19/2022 0121   BUN 44 (H) 06/19/2022 0121   BUN 30 (H) 07/21/2020 1519   CREATININE 1.84 (H) 06/19/2022 0121   CALCIUM 8.4 (L) 06/19/2022 0121   PROT 4.9 (L) 06/17/2022 0511   ALBUMIN 2.6 (L) 06/17/2022 0511   AST 15 06/17/2022 0511   ALT 9  06/17/2022 0511   ALKPHOS 66 06/17/2022 0511   BILITOT 0.6 06/17/2022 0511   GFRNONAA 28 (L) 06/19/2022 0121   GFRAA  09/17/2008 1612    >60        The eGFR has been calculated using the MDRD equation. This calculation has not been validated in all clinical situations. eGFR's persistently <60 mL/min signify possible Chronic Kidney Disease.   Lipase     Component Value Date/Time   LIPASE 21 12/12/2019 1610    Studies/Results: DG Abd Portable 1V-Small Bowel Protocol-Position Verification  Result Date: 06/19/2022 CLINICAL DATA:  Check gastric catheter placement EXAM: PORTABLE ABDOMEN - 1 VIEW COMPARISON:  None Available. FINDINGS: Gastric catheter is noted within the stomach. Proximal side port lies in the distal esophagus. This should be advanced deeper into the stomach. Mild small bowel dilatation is seen. IMPRESSION: Gastric catheter as described. This should be advanced deeper into the stomach. Electronically Signed   By: Alcide Clever M.D.   On: 06/19/2022 16:10   CT ABDOMEN PELVIS WO CONTRAST  Result Date: 06/19/2022 CLINICAL DATA:  Bowel obstruction suspected. EXAM: CT ABDOMEN  WITHOUT CONTRAST TECHNIQUE: Multidetector CT imaging of the abdomen was performed following the standard protocol without IV contrast. RADIATION DOSE REDUCTION: This exam was performed according to the departmental dose-optimization program which includes automated exposure control, adjustment of the mA and/or kV according to patient size and/or use of iterative reconstruction technique. COMPARISON:  01/09/2021 FINDINGS: Lower chest: Dependent atelectasis noted in both lower lobes with tiny right pleural effusion. Hepatobiliary: No suspicious focal abnormality within the liver parenchyma. There is no evidence for gallstones, gallbladder wall thickening, or pericholecystic fluid. No intrahepatic or extrahepatic biliary dilation. Pancreas: No focal mass lesion. No dilatation of the main duct. No intraparenchymal  cyst. No peripancreatic edema. Spleen: No splenomegaly. No focal mass lesion. Adrenals/Urinary Tract: No adrenal nodule or mass. Kidneys unremarkable. No evidence for hydroureter. The urinary bladder appears normal for the degree of distention. Stomach/Bowel: Stomach is distended with gas and contrast material. Duodenum is distended. Small bowel loops in the left upper quadrant are fluid-filled and distended up to 3.6 cm diameter. There is some edema in the mesentery of left upper quadrant jejunal loops (see axial 55/3). Dilated small bowel loops extend into the pelvis and while no discrete or abrupt transition zone is evident, distal ileal loops in the right lower quadrant are decompressed. The terminal ileum is decompressed. The appendix is normal. No gross colonic mass. No colonic wall thickening. Diverticuli are seen scattered along the entire length of the colon without CT findings of diverticulitis. Vascular/Lymphatic: There is moderate atherosclerotic calcification of the abdominal aorta without aneurysm. There is no gastrohepatic or hepatoduodenal ligament lymphadenopathy. No retroperitoneal or mesenteric lymphadenopathy. No pelvic sidewall lymphadenopathy. Other: Trace free fluid is seen adjacent to the liver. No free fluid in the pelvis. Musculoskeletal: Fixation hardware noted right femoral neck. Bones are diffusely demineralized IMPRESSION: 1. Dilated stomach and duodenum with dilated proximal small bowel and some edema in the mesentery of left upper quadrant jejunal loops. While no discrete or abrupt transition zone is evident, distal ileal loops in the right lower quadrant are decompressed. Imaging features are suspicious for small bowel obstruction. No small bowel wall thickening or evidence of pneumatosis. 2. Trace free fluid adjacent to the liver. 3. Tiny right pleural effusion with dependent atelectasis in both lower lobes. 4.  Aortic Atherosclerosis (ICD10-I70.0). Electronically Signed   By: Kennith Center M.D.   On: 06/19/2022 14:22    Anti-infectives: Anti-infectives (From admission, onward)    Start     Dose/Rate Route Frequency Ordered Stop   06/14/22 1200  ceFAZolin (ANCEF) IVPB 2g/100 mL premix       Note to Pharmacy: Dr. Carola Frost reviewed allergies, wants Ancef to be given   2 g 200 mL/hr over 30 Minutes Intravenous  Once 06/14/22 1147 06/14/22 1304        Assessment/Plan Ileus vs pSBO - Hx Hysterectomy - CT 6/8 w/ distended small bowel loops w/o obvious transition point.  - Afebrile. No tachycardia or hypotension. WBC downtrending. No peritonitis on exam. No indication for emergency surgery.  - Contrast in colon on today's film. Passing some flatus and had a small streak of stool yesterday but still some small bowel dilation on xray and NGT output > 1L/24 hours. Would keep NGT to LIWS today. Repeat abdominal xray in am. - Mobilize as able for bowel function. She did recently have R Hip IMN - WBAT per Ortho notes.  - Keep K > 4 and Mg > 2 for bowel function - We will follow with you.  FEN - NPO, NGT to LIWS, IVF per primary  VTE - SCDs, okay for chem ppx from a general surgery standpoint.  ID - None currently.   I reviewed nursing notes, last 24 h vitals and pain scores, last 48 h intake and output, last 24 h labs and trends, and last 24 h imaging results.    LOS: 8 days    Jacinto Halim , [facility] 06/20/2022, 9:35 AM Please see Amion for pager number during day hours 7:00am-4:30pm

## 2022-06-20 NOTE — Plan of Care (Signed)
  Problem: Coping: Goal: Level of anxiety will decrease Outcome: Progressing   Problem: Elimination: Goal: Will not experience complications related to bowel motility Outcome: Progressing   Problem: Skin Integrity: Goal: Risk for impaired skin integrity will decrease Outcome: Progressing

## 2022-06-20 NOTE — Progress Notes (Signed)
Progress Note   Patient: Chelsea [name] ZOX:[id number] DOB: [birth year] DOA: 06/12/2022     8 DOS: the patient was seen and examined on 06/20/2022 at 9:55AM      Brief hospital course: Mrs. Heilig is a 77 y.o. 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  In the ER, radiograph showed RIGHT hip fracture   6/1: Admitted 6/2: Got peripheral nerve block 6/3: Got IM nail by Dr. Carola Frost, creatinine up to 2.8 6/4: Still orthostatic     Assessment and Plan: * Hip fracture, right, closed, initial encounter (HCC) S/p INTRAMEDULLARY (IM) NAIL INTERTROCHANTERIC, REPAIR OF RIGHT HIP FRACTURE 6/3 by Dr. Carola Frost - PT ordered - WBAT R leg with walker - Unrestricted ROM - Analgesia per ortho - DVT PPx SCDs and Plavix    Ileus (HCC) No BM since surgery, now developed distension, nausea in the last 48 hours.  Gen Surg consulted 6/8, CT showed ileus, NG placed Symtoms improving - Maintain NG - Appreciate Gen Surg expertise    Hyperkalemia Resolved  Wheezing See prior notes.  Respiratory status stable.  No prior formal diagnosis of COPD.  PVD (peripheral vascular disease) (HCC) - Continue aspirin, Crestor, Plavix    Fall    AKI (acute kidney injury) on CKD IV Cr baseline 2-2.2, today up to 2.8 preop.  Resolved to 1.8 with fluids.   - Hold olmesartan, torsemide  - Avoid nephrotoxins  Pure hypercholesterolemia - Continue Crestor  History of CVA (cerebrovascular accident) - Continue aspirin, Plavix - Trend CBC - Continue Crestor  Malnutrition of moderate degree As evidenced by loss of subcutaneous muscle mass and fat diffusely  CKD (chronic kidney disease), stage IV (HCC) Cr baseline 2.0-2.2, see above  - Resume finerenone now that BP is better  Essential hypertension BP trending up  - Resume Coreg,  finerenone - Hold ARB due to AKI - Hold torsemide   Controlled type 2 diabetes mellitus with hyperglycemia, without long-term current use of insulin (HCC) A1c 8.1%.  Diet controlled at baseline. Glucoses fairly good here. - Continue SS corrections          Subjective: Patient is less nauseated, no vomiting.  Minimal flatus, no bowel movement.  NG placed yesterday.     Physical Exam: BP (!) 159/68 (BP Location: Left Arm)   Pulse 75   Temp 98.7 F (37.1 C) (Oral)   Resp 18   Ht 5\' 7"  (1.702 m)   Wt 69.4 kg   SpO2 97%   BMI 23.96 kg/m   Adult female, lying in bed, no acute distress, still weak and tired but oriented RRR, no murmurs, no pitting peripheral edema, no JVD Respiratory rate normal, lungs clear without rales or wheezes, diminished Abdomen soft, no focal tenderness, no significant distention Hard of hearing, face symmetric, speech fluent, memory slightly impaired but at baseline, mild tremor    Data Reviewed: Patient metabolic panel shows creatinine stable at 1.8, potassium down to 4.9 CBC shows stable anemia    Family Communication: Daughter at the bedside    Disposition: Status is: Inpatient Patient was admitted for hip fracture, she is undergone repair, and at this point is awaiting discharge to rehab as soon as her ileus resolves, she is able to have the NG removed, and take oral diet        Author: Cristal Deer P  Andria Head, MD 06/20/2022 2:43 PM  For on call review [email or web address].

## 2022-06-20 NOTE — Plan of Care (Signed)
  Problem: Education: Goal: Knowledge of General Education information will improve Description: Including pain rating scale, medication(s)/side effects and non-pharmacologic comfort measures Outcome: Progressing   Problem: Coping: Goal: Level of anxiety will decrease Outcome: Progressing   Problem: Pain Managment: Goal: General experience of comfort will improve Outcome: Progressing   Problem: Skin Integrity: Goal: Risk for impaired skin integrity will decrease Outcome: Progressing   

## 2022-06-21 ENCOUNTER — Inpatient Hospital Stay (HOSPITAL_COMMUNITY): Payer: PPO

## 2022-06-21 DIAGNOSIS — Z7189 Other specified counseling: Secondary | ICD-10-CM | POA: Diagnosis not present

## 2022-06-21 DIAGNOSIS — Z515 Encounter for palliative care: Secondary | ICD-10-CM | POA: Diagnosis not present

## 2022-06-21 DIAGNOSIS — S72001A Fracture of unspecified part of neck of right femur, initial encounter for closed fracture: Secondary | ICD-10-CM | POA: Diagnosis not present

## 2022-06-21 LAB — BASIC METABOLIC PANEL
Anion gap: 8 (ref 5–15)
BUN: 41 mg/dL — ABNORMAL HIGH (ref 8–23)
CO2: 29 mmol/L (ref 22–32)
Calcium: 7.9 mg/dL — ABNORMAL LOW (ref 8.9–10.3)
Chloride: 102 mmol/L (ref 98–111)
Creatinine, Ser: 1.75 mg/dL — ABNORMAL HIGH (ref 0.44–1.00)
GFR, Estimated: 29 mL/min — ABNORMAL LOW (ref >60)
Glucose, Bld: 131 mg/dL — ABNORMAL HIGH (ref 70–99)
Potassium: 4.5 mmol/L (ref 3.5–5.1)
Sodium: 139 mmol/L (ref 135–145)

## 2022-06-21 LAB — GLUCOSE, CAPILLARY
Glucose-Capillary: 115 mg/dL — ABNORMAL HIGH (ref 70–99)
Glucose-Capillary: 118 mg/dL — ABNORMAL HIGH (ref 70–99)
Glucose-Capillary: 125 mg/dL — ABNORMAL HIGH (ref 70–99)
Glucose-Capillary: 141 mg/dL — ABNORMAL HIGH (ref 70–99)
Glucose-Capillary: 147 mg/dL — ABNORMAL HIGH (ref 70–99)
Glucose-Capillary: 155 mg/dL — ABNORMAL HIGH (ref 70–99)

## 2022-06-21 LAB — CBC
HCT: 24 % — ABNORMAL LOW (ref 36.0–46.0)
Hemoglobin: 7.7 g/dL — ABNORMAL LOW (ref 12.0–15.0)
MCH: 30.9 pg (ref 26.0–34.0)
MCHC: 32.1 g/dL (ref 30.0–36.0)
MCV: 96.4 fL (ref 80.0–100.0)
Platelets: 176 10*3/uL (ref 150–400)
RBC: 2.49 MIL/uL — ABNORMAL LOW (ref 3.87–5.11)
RDW: 12.6 % (ref 11.5–15.5)
WBC: 12.2 10*3/uL — ABNORMAL HIGH (ref 4.0–10.5)
nRBC: 0 % (ref 0.0–0.2)

## 2022-06-21 LAB — PHOSPHORUS: Phosphorus: 3.1 mg/dL (ref 2.5–4.6)

## 2022-06-21 LAB — MAGNESIUM: Magnesium: 2.8 mg/dL — ABNORMAL HIGH (ref 1.7–2.4)

## 2022-06-21 MED ORDER — CHLORHEXIDINE GLUCONATE CLOTH 2 % EX PADS
6.0000 | MEDICATED_PAD | Freq: Every day | CUTANEOUS | Status: DC
  Administered 2022-06-21 – 2022-06-25 (×5): 6 via TOPICAL

## 2022-06-21 MED ORDER — PANTOPRAZOLE SODIUM 40 MG IV SOLR
40.0000 mg | INTRAVENOUS | Status: DC
  Administered 2022-06-21 – 2022-06-25 (×5): 40 mg via INTRAVENOUS
  Filled 2022-06-21 (×5): qty 10

## 2022-06-21 MED ORDER — METOCLOPRAMIDE HCL 5 MG/ML IJ SOLN: 5.0000 mg | Freq: Four times a day (QID) | INTRAMUSCULAR | Status: DC | PRN

## 2022-06-21 MED ORDER — ONDANSETRON HCL 4 MG/2ML IJ SOLN
4.0000 mg | Freq: Three times a day (TID) | INTRAMUSCULAR | Status: AC
  Administered 2022-06-21 – 2022-06-23 (×6): 4 mg via INTRAVENOUS
  Filled 2022-06-21 (×6): qty 2

## 2022-06-21 MED ORDER — METOCLOPRAMIDE HCL 5 MG/ML IJ SOLN
5.0000 mg | Freq: Three times a day (TID) | INTRAMUSCULAR | Status: DC
  Administered 2022-06-21: 5 mg via INTRAVENOUS
  Filled 2022-06-21: qty 2

## 2022-06-21 NOTE — Progress Notes (Signed)
Physical Therapy Treatment Patient Details Name: Chelsea [name] MRN: [id number] DOB: [birth year] Today's Date: 06/21/2022   History of Present Illness 77 yo female with onset of mechanical fall was admitted 6/1 for management. Had R intertrochanteric fracture with IM nailing done 6/3.  Has been walking only short trips at baseline, SOB with pt requiring HOB elevated at night. PMHx:  bronchitis, Stroke, R hemiparesis, LBBB, COPD, carotid stenosis, HTN, MI, L eye blind, DM, R hippocampus infarct    PT Comments    Pt was seen for mobility with second person assist to get to the chair and to get her BP checked.  Pt is in a stable BP and HR, as well as sats with 1L O2 in use.  Will recommend her to continue to be up with staff and rehab, and should be able to progress strengthening better with being up more.  Developed an ileus over the WE, NG tube in place and will work with pt as she tolerates given this new issue.  Family in and most supportive of pt's care.   Recommendations for follow up therapy are one component of a multi-disciplinary discharge planning process, led by the attending physician.  Recommendations may be updated based on patient status, additional functional criteria and insurance authorization.  Follow Up Recommendations  Can patient physically be transported by private vehicle: No    Assistance Recommended at Discharge Frequent or constant Supervision/Assistance  Patient can return home with the following Two people to help with walking and/or transfers;A lot of help with bathing/dressing/bathroom;Assistance with cooking/housework;Assist for transportation;Help with stairs or ramp for entrance   Equipment Recommendations  None recommended by PT    Recommendations for Other Services       Precautions / Restrictions Precautions Precautions: Fall Precaution Comments: ck sats, NG tube, watch BP Restrictions RLE Weight Bearing: Weight bearing as tolerated     Mobility   Bed Mobility Overal bed mobility: Needs Assistance Bed Mobility: Supine to Sit     Supine to sit: Mod assist, +2 for physical assistance, +2 for safety/equipment          Transfers Overall transfer level: Needs assistance Equipment used: Ambulation equipment used Transfers: Sit to/from Stand Sit to Stand: Max assist, +2 physical assistance, +2 safety/equipment             Transfer via Lift Equipment: Stedy  Ambulation/Gait               General Gait Details: unable   Optometrist    Modified Rankin (Stroke Patients Only)       Balance Overall balance assessment: Needs assistance Sitting-balance support: Feet supported, Single extremity supported Sitting balance-Leahy Scale: Fair Sitting balance - Comments: fair once set                                    Cognition Arousal/Alertness: Lethargic, Awake/alert Behavior During Therapy: Flat affect Overall Cognitive Status: Impaired/Different from baseline Area of Impairment: Attention, Awareness, Memory                   Current Attention Level: Selective Memory: Decreased short-term memory     Awareness: Intellectual   General Comments: pt is cooperative with extra time to manage the stedy lift        Exercises      General Comments General comments (  skin integrity, edema, etc.): Pt had controlled sitting BP in chair and was comfortable relative to her new ileus and prev hip surgery      Pertinent Vitals/Pain Pain Assessment Pain Assessment: Faces Faces Pain Scale: Hurts a little bit Pain Location: R hip and abd    Home Living                          Prior Function            PT Goals (current goals can now be found in the care plan section) Acute Rehab PT Goals Patient Stated Goal: to get up Progress towards PT goals: Progressing toward goals    Frequency    Min 3X/week      PT Plan Current plan remains  appropriate    Co-evaluation              AM-PAC PT "6 Clicks" Mobility   Outcome Measure  Help needed turning from your back to your side while in a flat bed without using bedrails?: A Lot Help needed moving from lying on your back to sitting on the side of a flat bed without using bedrails?: Total Help needed moving to and from a bed to a chair (including a wheelchair)?: Total Help needed standing up from a chair using your arms (e.g., wheelchair or bedside chair)?: Total Help needed to walk in hospital room?: Total Help needed climbing 3-5 steps with a railing? : Total 6 Click Score: 7    End of Session Equipment Utilized During Treatment: Gait belt Activity Tolerance: Patient tolerated treatment well;Patient limited by pain Patient left: in chair;with call bell/phone within reach;with chair alarm set;with family/visitor present Nurse Communication: Mobility status PT Visit Diagnosis: Unsteadiness on feet (R26.81);Muscle weakness (generalized) (M62.81);Adult, failure to thrive (R62.7);Pain;Hemiplegia and hemiparesis Hemiplegia - Right/Left: Right Hemiplegia - dominant/non-dominant: Dominant Hemiplegia - caused by: Unspecified Pain - Right/Left: Right Pain - part of body: Hip     Time: 0981-1914 PT Time Calculation (min) (ACUTE ONLY): 23 min  Charges:  $Therapeutic Activity: 23-37 mins    Ivar Drape 06/21/2022, 7:33 PM  Samul Dada, PT PhD Acute Rehab Dept. Number: [facility] [id number] and [facility] [phone]

## 2022-06-21 NOTE — Progress Notes (Signed)
Nutrition Follow-up  DOCUMENTATION CODES:   Not applicable  INTERVENTION:  Recommend initiation of TPN given inability to advance diet and NPO status x7 days; discussed with Surgery  Pt is at high refeeding risk, if nutrition support initiated, monitor magnesium, potassium, and phosphorus BID for at least 3 days, MD to replete as needed  Recommend thiamine 100mg  x5 days if nutrition support indicated  Request updated measured weight to assess  NUTRITION DIAGNOSIS:   Increased nutrient needs related to hip fracture, post-op healing as evidenced by estimated needs. - remains applicable  GOAL:   Patient will meet greater than or equal to 90% of their needs - goal unmet, now NPO d/t ileus  MONITOR:   Diet advancement, Supplement acceptance, Weight trends, Labs, Skin  REASON FOR ASSESSMENT:   Consult Hip fracture protocol  ASSESSMENT:   Pt admitted with R hip fracture following a fall. PMH significant for CVA with residual chronic R sided paresis (2021), HTN, T2DM, HLD, CKD stage IV, L sided carotid stenosis s/p CEA.  6/3 - s/p IMN of R hip intertrochanteric fracture 6/8 - NGT to LIS d/t ileus  Pt sitting up in chair with her daughter present at bedside. 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   Her PO intake has remained at her baseline up until admission. She lives with her daughter who usually prepared meals. She has breakfast and lunch around 2-3pm but does not often eat after her lunch meal. She may have fruit but does not typically snack.   Pt has remained with limited/no PO intake throughout admission d/t surgery, constipation and ileus. Noted clamping trials today however pt reports having increased distension since clamping. RN present and unclamped NGT during visit. Pt denies nausea at that time and report having flatus but no BM.   Pt prefers her weight to be  ~145 lbs her whole life. She sometimes would gain up to 150-155 lbs but would diet to maintain her usual weight.  Just prior to admission, she had recently gained weight to about 152 lbs.   Medications: vitamin D3,  colace, SSI 0-9 units q4h, IV zofran, IV protonix, miralax, sodium bicarbonate  Labs: BUN 41, Cr 1.75, Mg 2.8, GFR 29, CBG's 115-147 x24 hours  NGT output not documented today.   NUTRITION - FOCUSED PHYSICAL EXAM:  Flowsheet Row Most Recent Value  Orbital Region Mild depletion  Upper Arm Region No depletion  Thoracic and Lumbar Region No depletion  Buccal Region No depletion  Temple Region Moderate depletion  Clavicle Bone Region Mild depletion  Clavicle and Acromion Bone Region No depletion  Scapular Bone Region No depletion  Dorsal Hand Mild depletion  Patellar Region No depletion  Anterior Thigh Region No depletion  Posterior Calf Region No depletion  Edema (RD Assessment) None  Hair Reviewed  Eyes Reviewed  Mouth Reviewed  Skin Reviewed  Nails Reviewed       Diet Order:   Diet Order             Diet NPO time specified Except for: Ice Chips  Diet effective now                   EDUCATION NEEDS:   Education needs have been addressed  Skin:  Skin Assessment: Reviewed RN Assessment (R hip incision closed)  Last BM:  6/7  Height:   Ht Readings from Last 1  Encounters:  06/14/22 5\' 7"  (1.702 m)    Weight:   Wt Readings from Last 1 Encounters:  06/14/22 69.4 kg    Ideal Body Weight:     BMI:  Body mass index is 23.96 kg/m.  Estimated Nutritional Needs:   Kcal:  1700-1900  Protein:  85-100g  Fluid:  >/=1.7L   Drusilla Kanner, RDN, LDN Clinical Nutrition

## 2022-06-21 NOTE — Progress Notes (Signed)
Palliative Medicine Inpatient Follow Up Note HPI: This 77 yrs old Female with DM, hx. 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 Admitted for R hip fracture requiring IM nail placement.   The Palliative care team has been asked to get involved for further goals of care conversations.   Today's Discussion 06/21/2022  *Please note that this is a verbal dictation therefore any spelling or grammatical errors are due to the "Dragon [employer] One" system interpretation.  Chart reviewed inclusive of vital signs, progress notes, laboratory results, and diagnostic images.   I met with Chelsea [name] and her daughters Smith Robert and [name] this morning.  We discussed Chelsea [name]'s health over the weekend and how she has had from her daughter's approximation about 4 L of output from her nasogastric tube.  We reviewed the plan to continue present measures and rely upon the general surgery team to help guide when the nasogastric tube can be clamped and/or taken out as well as when she can start eating and drinking again.  We discussed the importance of mobility as well as a good stool regiment.  Chelsea [name] shares that she is willing to get up with physical therapy at the request there are 2 people there to help as opposed to one.  I told her that I would reach out to the physical therapy team to support this request.  Chelsea [name] does continue to have intermittent episodes of nausea.  We discussed ordering an antiemetic every 8 hours around-the-clock to better help support these symptoms.  Created space and opportunity for patient to explore thoughts feelings and fears regarding current medical situation. She shares how tired she is feeling in the setting of everything that is gone on during this prolonged hospitalization.  I offered time for her to express herself and provided  emotional support through therapeutic listening.  She is aware that she does not need to continue these measures if she finds them to be more burdensome than beneficial.  We did discuss though that much of what is going on right now is reversible and treatable which she understands. ________________  I then spoke to patient's daughter, Smith Robert outside the room and I shared if at any point Chelsea [name] does not want [place] to continue the present measures then  we would stop them and honor her wishes which at that point would be to go home and to die on hospice care.  Questions and concerns addressed/Palliative Support Provided.   Objective Assessment: Vital Signs Vitals:   06/21/22 0425 06/21/22 0838  BP: (!) 143/56 (!) 158/63  Pulse: 62 65  Resp: 17 18  Temp: 97.9 F (36.6 C) 98.8 F (37.1 C)  SpO2: 97% 98%    Intake/Output Summary (Last 24 hours) at 06/21/2022 1207 Last data filed at 06/21/2022 1610 Gross per 24 hour  Intake 70 ml  Output 1275 ml  Net -1205 ml   Last Weight  Most recent update: 06/14/2022  9:35 AM    Weight  69.4 kg (153 lb)            Gen: Elderly Caucasian F in NAD HEENT: moist mucous membranes CV: Regular rate and rhythm  PULM:  On 2LPM [place], breathing nonlabored ABD: Distended w/hypoactive BS, tender to palpation EXT: No edema  Neuro: Alert and oriented x3   SUMMARY OF RECOMMENDATIONS   DNAR/DNI   MOST form complete and  on chart - Will scan in Vynca    Goals at this time are for improvements - allowing time for outcomes   TOC - OP Palliative support on discharge   Symptoms as below   Ongoing support   Code Status/Advance Care Planning: DNAR/DNI   Symptom Management:  Urinary retention: - Foley in place  Post operative ileus: - Appreciate surgery recommendations - NGT in place   Nausea/Vomiting: - Reglan 5mg  Q6H PRN - Zofran 4mg  IVP Q8H AYC x48 hours   Constipation: - Can safely get an enema   Weakness: - PT/OT   Leukocytosis: - WU  per primary team  Time Spent: 80 Billing based on MDM: High ______________________________________________________________________________________ Lamarr Lulas Everson Palliative Medicine Team Team Cell Phone: [phone] Please utilize secure chat with additional questions, if there is no response within 30 minutes please call the above phone number  Palliative Medicine Team providers are available by phone from 7am to 7pm daily and can be reached through the team cell phone.  Should this patient require assistance outside of these hours, please call the patient's attending physician.

## 2022-06-21 NOTE — Progress Notes (Signed)
Progress Note   Patient: Chelsea [name]:[id number] DOB: Jan 20, 1945 DOA: 06/12/2022     9 DOS: the patient was seen and examined on 06/21/2022 at 9:55AM      Brief hospital course: Mrs. Chelsea [name] is a 77 y.o. 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  In the ER, radiograph showed RIGHT hip fracture   6/1: Admitted 6/2: Got peripheral nerve block 6/3: Got IM nail by Dr. Carola [name], creatinine up to 2.8 6/4: Still orthostatic 6/10 : Output from the NG tube has decreased, clamping trial initiated today.  Patient is passing some flatulence.     Assessment and Plan: * Hip fracture, right, closed, initial encounter (HCC) S/p INTRAMEDULLARY (IM) NAIL INTERTROCHANTERIC, REPAIR OF RIGHT HIP FRACTURE 6/3 by Dr. Carola [name] - PT ordered - WBAT R leg with walker - Unrestricted ROM - Analgesia per ortho - DVT PPx SCDs and Plavix    Ileus (HCC) No BM since surgery, now developed distension, nausea in the last 48 hours.  Gen Surg consulted 6/8, CT showed ileus, NG placed Symtoms improving - Maintain NG, output decreased, clamping trial initiated - Appreciate Gen Surg expertise -  continue IV fluids - CMP does not show any significant electrolyte abnormality - Mild x-ray done today does not show any change in dilated small bowel loops in the central of the abdomen.  Leukocytosis: - Patient has some mild leukocytosis of 12.2 but no fever - Continue to monitor if trends up will check procalcitonin - Currently does not seem to be having any overt signs of infection.    Hyperkalemia Resolved  Wheezing See prior notes.  Respiratory status stable.  No prior formal diagnosis of COPD.  PVD (peripheral vascular disease) (HCC) - Continue aspirin, Crestor, Plavix    Fall    AKI (acute kidney injury) on CKD IV Cr baseline  2-2.2, today up to 2.8 preop.  Resolved to 1.8 with fluids.   - Hold olmesartan, torsemide  - Avoid nephrotoxins  Pure hypercholesterolemia - Continue Crestor  History of CVA (cerebrovascular accident) - Continue aspirin, Plavix - Trend CBC - Continue Crestor  Malnutrition of moderate degree As evidenced by loss of subcutaneous muscle mass and fat diffusely  CKD (chronic kidney disease), stage IV (HCC) Cr baseline 2.0-2.2, see above  - Resume finerenone now that BP is better  Essential hypertension BP trending up  - Resume Coreg, finerenone - Hold ARB due to AKI - Hold torsemide   Controlled type 2 diabetes mellitus with hyperglycemia, without long-term current use of insulin (HCC) A1c 8.1%.  Diet controlled at baseline. Glucoses fairly good here. - Continue SS corrections          Subjective: Patient seen and examined at bedside today.  Patient denies having any abdominal pain nausea vomiting, NG tube output has decreased.  Intermittent clamping trial ordered by the general surgery team.     Physical Exam: BP (!) 153/58 (BP Location: Left Arm)   Pulse (!) 59   Temp 98.2 F (36.8 C)   Resp 16   Ht 5\' 7"  (1.702 m)   Wt 69.4 kg   SpO2 100%   BMI 23.96 kg/m   Adult female, lying in bed, no acute distress, still weak and tired but oriented RRR, no murmurs, no pitting peripheral edema, no JVD Respiratory rate normal, lungs clear without rales  or wheezes, diminished Abdomen soft, no focal tenderness, no significant distention Hard of hearing, face symmetric, speech fluent, memory slightly impaired but at baseline, mild tremor    Data Reviewed: Patient metabolic panel shows creatinine stable at 1.8, potassium down to 4.9 CBC shows stable anemia    Family Communication: Daughter at the bedside    Disposition: Status is: Inpatient Patient was admitted for hip fracture, she is undergone repair, and at this point is awaiting discharge to rehab as soon as  her ileus resolves, she is able to have the NG removed, and take oral diet        Author: Harold Hedge, MD 06/21/2022 2:44 PM  For on call review [email or web address].

## 2022-06-21 NOTE — Progress Notes (Signed)
Orthopaedic Trauma Service Progress Note  Patient ID: Chelsea [name] MRN: [id number] DOB/AGE: Mar 26, 1945 77 y.o.  Subjective:  Events of weekend noted NGT in place  Denies R hip pain    ROS As above  Objective:   VITALS:   Vitals:   06/20/22 1353 06/20/22 2023 06/21/22 0425 06/21/22 0838  BP: (!) 159/68 (!) 140/88 (!) 143/56 (!) 158/63  Pulse: 75 68 62 65  Resp: 18 17 17 18   Temp: 98.7 F (37.1 C) 98.5 F (36.9 C) 97.9 F (36.6 C) 98.8 F (37.1 C)  TempSrc: Oral     SpO2: 97% 97% 97% 98%  Weight:      Height:        Estimated body mass index is 23.96 kg/m as calculated from the following:   Height as of this encounter: 5\' 7"  (1.702 m).   Weight as of this encounter: 69.4 kg.   Intake/Output      06/09 0701 06/10 0700 06/10 0701 06/11 0700   I.V. (mL/kg)     NG/GT 70    Total Intake(mL/kg) 70 (1)    Urine (mL/kg/hr) 300 (0.2) 400 (1.4)   Emesis/NG output 575    Total Output 875 400   Net -805 -400          LABS  Results for orders placed or performed during the hospital encounter of 06/12/22 (from the past 24 hour(s))  Glucose, capillary     Status: None   Collection Time: 06/20/22  4:10 PM  Result Value Ref Range   Glucose-Capillary 88 70 - 99 mg/dL  Glucose, capillary     Status: Abnormal   Collection Time: 06/20/22  8:24 PM  Result Value Ref Range   Glucose-Capillary 127 (H) 70 - 99 mg/dL  Glucose, capillary     Status: Abnormal   Collection Time: 06/21/22 12:12 AM  Result Value Ref Range   Glucose-Capillary 155 (H) 70 - 99 mg/dL  Basic metabolic panel     Status: Abnormal   Collection Time: 06/21/22  2:46 AM  Result Value Ref Range   Sodium 139 135 - 145 mmol/L   Potassium 4.5 3.5 - 5.1 mmol/L   Chloride 102 98 - 111 mmol/L   CO2 29 22 - 32 mmol/L   Glucose, Bld 131 (H) 70 - 99 mg/dL   BUN 41 (H) 8 - 23 mg/dL   Creatinine, Ser 4.69 (H) 0.44 - 1.00 mg/dL    Calcium 7.9 (L) 8.9 - 10.3 mg/dL   GFR, Estimated 29 (L) >60 mL/min   Anion gap 8 5 - 15  Phosphorus     Status: None   Collection Time: 06/21/22  2:46 AM  Result Value Ref Range   Phosphorus 3.1 2.5 - 4.6 mg/dL  Magnesium     Status: Abnormal   Collection Time: 06/21/22  2:46 AM  Result Value Ref Range   Magnesium 2.8 (H) 1.7 - 2.4 mg/dL  CBC     Status: Abnormal   Collection Time: 06/21/22  2:46 AM  Result Value Ref Range   WBC 12.2 (H) 4.0 - 10.5 K/uL   RBC 2.49 (L) 3.87 - 5.11 MIL/uL   Hemoglobin 7.7 (L) 12.0 - 15.0 g/dL   HCT 62.9 (L) 52.8 - 41.3 %   MCV 96.4 80.0 - 100.0 fL  MCH 30.9 26.0 - 34.0 pg   MCHC 32.1 30.0 - 36.0 g/dL   RDW 09.8 11.9 - 14.7 %   Platelets 176 150 - 400 K/uL   nRBC 0.0 0.0 - 0.2 %  Glucose, capillary     Status: Abnormal   Collection Time: 06/21/22  4:26 AM  Result Value Ref Range   Glucose-Capillary 125 (H) 70 - 99 mg/dL  Glucose, capillary     Status: Abnormal   Collection Time: 06/21/22  8:40 AM  Result Value Ref Range   Glucose-Capillary 141 (H) 70 - 99 mg/dL     PHYSICAL EXAM:   Gen: in bed, NAD, pleasant, family present. NGT in place  Lungs: unlabored  Cardiac: reg pulse  Ext:       Right Lower Extremity              Dressings clean, dry and intact             Ext warm              + dp pulse             No DCT             No pitting edema             Swelling minimal              EHL, FHL, lesser toe motor intact             Ankle flexion, extension, inversion and eversion intact             + Quad set              Good perfusion distally     Assessment/Plan: 7 Days Post-Op    Anti-infectives (From admission, onward)    Start     Dose/Rate Route Frequency Ordered Stop   06/14/22 1200  ceFAZolin (ANCEF) IVPB 2g/100 mL premix       Note to Pharmacy: Dr. Carola Frost reviewed allergies, wants Ancef to be given   2 g 200 mL/hr over 30 Minutes Intravenous  Once 06/14/22 1147 06/14/22 1304     .  POD/HD#: 25  77 y/o female  with complex medical history s/p fall with R hip fracture    -fragility fracture R hip s/p IMN R hip   Weightbearing WBAT R leg with walker/assistance               ROM/Activity                         Unrestricted ROM R hip and knee                          Activity as tolerated                  Wound care                         Dressing changes as needed to right hip    PT/OT    Ice PRN R hip      - Pain management:             Multimodal              Minimize narcotics                           -  ABL anemia/Hemodynamics                         Continue to monitor    - Medical issues              Per primary    - DVT/PE prophylaxis:             Scds             Resumed plavix   - ID:              Periop abx completed    - Metabolic Bone Disease:             Vitamin deficiency                          Noted on outside labs                         Supplement             This hip fracture is a fragility fracture             Outpt workup                          Referral to osteoporosis clinic                          DEXA                Appreciate RD recs    - Activity:             As above   - FEN             NGT for ileua  NPO except for ice chips      - Impediments to fracture healing:             Vitamin d deficiency              Fragility fracture             Age              DM    - Dispo:             Therapies             TOC consult              Ortho issues stable             SNF at DC                 Mearl Latin, PA-C [phone] (C) 06/21/2022, 11:16 AM  [facility] [address] [place] [address] [phone] [phone] (F)    After 5pm and on the weekends please log on to Amion, go to orthopaedics and the look under the [facility] Call for the provider(s) on call. You can also call our office at [phone] and then follow the prompts to be connected to the call team.   Patient ID: Chelsea [name], female   DOB: 06/30/1945, 77 y.o.   MRN: [id number]

## 2022-06-21 NOTE — Progress Notes (Signed)
Mobility Specialist Progress Note   06/21/22 1430  Mobility  Activity Transferred from chair to bed  Level of Assistance +2 (takes two people)  Assistive Device [employer]/Exercises Active;All extremities  RLE Weight Bearing WBAT  Activity Response Tolerated well   Patient received in recliner requesting assistance back to bed. Required max A+2 to stand from low surface of recliner chair, then transferred back to bed via Stedy. Completed STS x3 total for pericare upon returning to bed. Tolerated without complaint or incident. Needed mod A +2 for bed mobility from side-lying to supine. Was left in supine with all needs met, call bell in reach.   [place] Clemente Dewey, BS EXP Mobility Specialist Please contact via SecureChat or Rehab office at [phone]

## 2022-06-21 NOTE — Progress Notes (Signed)
Met with patient and two daughters  to offer emotional and general support through therapeutic listening, empathy and sharing of stories. Education on mouth care provided.   Barrie Folk MS Ed.S, RN Palliative Medicine Team Palliative Team Phone: [phone]

## 2022-06-21 NOTE — Progress Notes (Signed)
7 Days Post-Op  Subjective: Much less bloating and no pain this AM. Passing small amount of flatus. Did not get out of bed yesterday but plans to do so today. Less NGT output in last 24h. Daughter at the bedside.   Objective: Vital signs in last 24 hours: Temp:  [97.9 F (36.6 C)-98.8 F (37.1 C)] 98.8 F (37.1 C) (06/10 0838) Pulse Rate:  [62-75] 65 (06/10 0838) Resp:  [17-18] 18 (06/10 0838) BP: (140-159)/(56-88) 158/63 (06/10 0838) SpO2:  [97 %-98 %] 98 % (06/10 0838) Last BM Date : 06/20/22  Intake/Output from previous day: 06/09 0701 - 06/10 0700 In: 70 [NG/GT:70] Out: 875 [Urine:300; Emesis/NG output:575] Intake/Output this shift: No intake/output data recorded.  PE: Gen:  Alert, NAD, pleasant Abd: Mild distension but very soft and completely NT. normoactive BS. NGT in place w/ thick dark output.   Lab Results:  Recent Labs    06/20/22 0933 06/21/22 0246  WBC 10.6* 12.2*  HGB 8.6* 7.7*  HCT 26.7* 24.0*  PLT 336 176    BMET Recent Labs    06/20/22 0933 06/21/22 0246  NA 138 139  K 4.9 4.5  CL 104 102  CO2 26 29  GLUCOSE 145* 131*  BUN 40* 41*  CREATININE 1.80* 1.75*  CALCIUM 8.3* 7.9*    PT/INR No results for input(s): "LABPROT", "INR" in the last 72 hours. CMP     Component Value Date/Time   NA 139 06/21/2022 0246   NA 138 07/21/2020 1519   K 4.5 06/21/2022 0246   CL 102 06/21/2022 0246   CO2 29 06/21/2022 0246   GLUCOSE 131 (H) 06/21/2022 0246   BUN 41 (H) 06/21/2022 0246   BUN 30 (H) 07/21/2020 1519   CREATININE 1.75 (H) 06/21/2022 0246   CALCIUM 7.9 (L) 06/21/2022 0246   PROT 5.7 (L) 06/20/2022 0933   ALBUMIN 2.7 (L) 06/20/2022 0933   AST 27 06/20/2022 0933   ALT 18 06/20/2022 0933   ALKPHOS 76 06/20/2022 0933   BILITOT 1.1 06/20/2022 0933   GFRNONAA 29 (L) 06/21/2022 0246   GFRAA  09/17/2008 1612    >60        The eGFR has been calculated using the MDRD equation. This calculation has not been validated in all  clinical situations. eGFR's persistently <60 mL/min signify possible Chronic Kidney Disease.   Lipase     Component Value Date/Time   LIPASE 21 12/12/2019 0811    Studies/Results: DG Abd Portable 1V-Small Bowel Obstruction Protocol-initial, 8 hr delay  Result Date: 06/20/2022 CLINICAL DATA:  Follow up small bowel obstruction EXAM: PORTABLE ABDOMEN - 1 VIEW COMPARISON:  06/19/2022 FINDINGS: Scattered large and small bowel gas is noted. Previously administered contrast now lies predominately within the colon. Persistent small bowel dilatation is seen. No free air is noted. IMPRESSION: Changes consistent with partial small bowel obstruction. Electronically Signed   By: Alcide Clever M.D.   On: 06/20/2022 10:01   DG Abd Portable 1V-Small Bowel Protocol-Position Verification  Result Date: 06/19/2022 CLINICAL DATA:  Check gastric catheter placement EXAM: PORTABLE ABDOMEN - 1 VIEW COMPARISON:  None Available. FINDINGS: Gastric catheter is noted within the stomach. Proximal side port lies in the distal esophagus. This should be advanced deeper into the stomach. Mild small bowel dilatation is seen. IMPRESSION: Gastric catheter as described. This should be advanced deeper into the stomach. Electronically Signed   By: Alcide Clever M.D.   On: 06/19/2022 16:10   CT ABDOMEN PELVIS WO CONTRAST  Result Date: 06/19/2022 CLINICAL DATA:  Bowel obstruction suspected. EXAM: CT ABDOMEN WITHOUT CONTRAST TECHNIQUE: Multidetector CT imaging of the abdomen was performed following the standard protocol without IV contrast. RADIATION DOSE REDUCTION: This exam was performed according to the departmental dose-optimization program which includes automated exposure control, adjustment of the mA and/or kV according to patient size and/or use of iterative reconstruction technique. COMPARISON:  01/09/2021 FINDINGS: Lower chest: Dependent atelectasis noted in both lower lobes with tiny right pleural effusion. Hepatobiliary: No  suspicious focal abnormality within the liver parenchyma. There is no evidence for gallstones, gallbladder wall thickening, or pericholecystic fluid. No intrahepatic or extrahepatic biliary dilation. Pancreas: No focal mass lesion. No dilatation of the main duct. No intraparenchymal cyst. No peripancreatic edema. Spleen: No splenomegaly. No focal mass lesion. Adrenals/Urinary Tract: No adrenal nodule or mass. Kidneys unremarkable. No evidence for hydroureter. The urinary bladder appears normal for the degree of distention. Stomach/Bowel: Stomach is distended with gas and contrast material. Duodenum is distended. Small bowel loops in the left upper quadrant are fluid-filled and distended up to 3.6 cm diameter. There is some edema in the mesentery of left upper quadrant jejunal loops (see axial 55/3). Dilated small bowel loops extend into the pelvis and while no discrete or abrupt transition zone is evident, distal ileal loops in the right lower quadrant are decompressed. The terminal ileum is decompressed. The appendix is normal. No gross colonic mass. No colonic wall thickening. Diverticuli are seen scattered along the entire length of the colon without CT findings of diverticulitis. Vascular/Lymphatic: There is moderate atherosclerotic calcification of the abdominal aorta without aneurysm. There is no gastrohepatic or hepatoduodenal ligament lymphadenopathy. No retroperitoneal or mesenteric lymphadenopathy. No pelvic sidewall lymphadenopathy. Other: Trace free fluid is seen adjacent to the liver. No free fluid in the pelvis. Musculoskeletal: Fixation hardware noted right femoral neck. Bones are diffusely demineralized IMPRESSION: 1. Dilated stomach and duodenum with dilated proximal small bowel and some edema in the mesentery of left upper quadrant jejunal loops. While no discrete or abrupt transition zone is evident, distal ileal loops in the right lower quadrant are decompressed. Imaging features are suspicious  for small bowel obstruction. No small bowel wall thickening or evidence of pneumatosis. 2. Trace free fluid adjacent to the liver. 3. Tiny right pleural effusion with dependent atelectasis in both lower lobes. 4.  Aortic Atherosclerosis (ICD10-I70.0). Electronically Signed   By: Kennith Center M.D.   On: 06/19/2022 14:22    Anti-infectives: Anti-infectives (From admission, onward)    Start     Dose/Rate Route Frequency Ordered Stop   06/14/22 1200  ceFAZolin (ANCEF) IVPB 2g/100 mL premix       Note to Pharmacy: Dr. Carola Frost reviewed allergies, wants Ancef to be given   2 g 200 mL/hr over 30 Minutes Intravenous  Once 06/14/22 1147 06/14/22 1304        Assessment/Plan Ileus vs pSBO - Hx Hysterectomy - CT 6/8 w/ distended small bowel loops w/o obvious transition point.  - Afebrile. No tachycardia or hypotension. WBC downtrending. No peritonitis on exam. No indication for emergency surgery.  - Contrast in colon to descending colon/rectum on today's film - clamping trials and ok to continue ice chips  - Mobilize as able for bowel function. She did recently have R Hip IMN - WBAT per Ortho notes.  - Keep K > 4 and Mg > 2 for bowel function - We will follow with you.   FEN - ice chips, NGT clamping trials, IVF per primary  VTE - SCDs, plavix resumed today  ID - None currently.   I reviewed nursing notes, hospitalist notes, last 24 h vitals and pain scores, last 48 h intake and output, last 24 h labs and trends, and last 24 h imaging results.    LOS: 9 days    Juliet Rude , [facility] 06/21/2022, 8:50 AM Please see Amion for pager number during day hours 7:00am-4:30pm

## 2022-06-22 ENCOUNTER — Inpatient Hospital Stay (HOSPITAL_COMMUNITY): Payer: PPO

## 2022-06-22 ENCOUNTER — Other Ambulatory Visit: Payer: Self-pay | Admitting: Cardiovascular Disease

## 2022-06-22 DIAGNOSIS — S72001A Fracture of unspecified part of neck of right femur, initial encounter for closed fracture: Secondary | ICD-10-CM | POA: Diagnosis not present

## 2022-06-22 LAB — COMPREHENSIVE METABOLIC PANEL
ALT: 16 U/L (ref 0–44)
AST: 17 U/L (ref 15–41)
Albumin: 2.5 g/dL — ABNORMAL LOW (ref 3.5–5.0)
Alkaline Phosphatase: 76 U/L (ref 38–126)
Anion gap: 6 (ref 5–15)
BUN: 34 mg/dL — ABNORMAL HIGH (ref 8–23)
CO2: 29 mmol/L (ref 22–32)
Calcium: 8 mg/dL — ABNORMAL LOW (ref 8.9–10.3)
Chloride: 106 mmol/L (ref 98–111)
Creatinine, Ser: 1.69 mg/dL — ABNORMAL HIGH (ref 0.44–1.00)
GFR, Estimated: 31 mL/min — ABNORMAL LOW (ref >60)
Glucose, Bld: 139 mg/dL — ABNORMAL HIGH (ref 70–99)
Potassium: 4.3 mmol/L (ref 3.5–5.1)
Sodium: 141 mmol/L (ref 135–145)
Total Bilirubin: 0.9 mg/dL (ref 0.3–1.2)
Total Protein: 4.8 g/dL — ABNORMAL LOW (ref 6.5–8.1)

## 2022-06-22 LAB — GLUCOSE, CAPILLARY
Glucose-Capillary: 115 mg/dL — ABNORMAL HIGH (ref 70–99)
Glucose-Capillary: 124 mg/dL — ABNORMAL HIGH (ref 70–99)
Glucose-Capillary: 130 mg/dL — ABNORMAL HIGH (ref 70–99)
Glucose-Capillary: 139 mg/dL — ABNORMAL HIGH (ref 70–99)
Glucose-Capillary: 141 mg/dL — ABNORMAL HIGH (ref 70–99)
Glucose-Capillary: 160 mg/dL — ABNORMAL HIGH (ref 70–99)
Glucose-Capillary: 184 mg/dL — ABNORMAL HIGH (ref 70–99)

## 2022-06-22 LAB — CBC
HCT: 26.6 % — ABNORMAL LOW (ref 36.0–46.0)
Hemoglobin: 8.2 g/dL — ABNORMAL LOW (ref 12.0–15.0)
MCH: 30.7 pg (ref 26.0–34.0)
MCHC: 30.8 g/dL (ref 30.0–36.0)
MCV: 99.6 fL (ref 80.0–100.0)
Platelets: 419 10*3/uL — ABNORMAL HIGH (ref 150–400)
RBC: 2.67 MIL/uL — ABNORMAL LOW (ref 3.87–5.11)
RDW: 12.6 % (ref 11.5–15.5)
WBC: 12.8 10*3/uL — ABNORMAL HIGH (ref 4.0–10.5)
nRBC: 0 % (ref 0.0–0.2)

## 2022-06-22 LAB — PROCALCITONIN: Procalcitonin: <0.1 ng/mL

## 2022-06-22 MED ORDER — BISACODYL 10 MG RE SUPP
10.0000 mg | Freq: Once | RECTAL | Status: AC
  Administered 2022-06-22: 10 mg via RECTAL
  Filled 2022-06-22: qty 1

## 2022-06-22 NOTE — Progress Notes (Signed)
Occupational Therapy Treatment Patient Details Name: Chelsea [name] MRN: [id number] DOB: 25-Nov-1945 Today's Date: 06/22/2022   History of present illness 77 yo female with onset of mechanical fall was admitted 6/1 for management. Had R intertrochanteric fracture with IM nailing done 6/3.  Has been walking only short trips at baseline, SOB with pt requiring HOB elevated at night. PMHx:  bronchitis, Stroke, R hemiparesis, LBBB, COPD, carotid stenosis, HTN, MI, L eye blind, DM, R hippocampus infarct   OT comments  Pt tolerated session well today. Arrived with Pt in chair, able to sit in chair and perform AROM with RLE knee extension and knee raises, minimal AROM with knee raise. Pt very fatigued and some pain during exercises, frequent breaks, slow repetitions. Pt BUEs overall good strength/ROM. Pt required mod A X2 for sit to stand from recliner using Stedy. Pt able to perform sit to stands at EOB from elevated surface with minimal assistance, able to stand for 15 seconds max each stand. Pt max A x2 for bed mobility for BLE assistance and scooting back in bed. Pt would benefit greatly from continued skilled therapy to continue to improve functional strength and activity tolerance.    Recommendations for follow up therapy are one component of a multi-disciplinary discharge planning process, led by the attending physician.  Recommendations may be updated based on patient status, additional functional criteria and insurance authorization.    Assistance Recommended at Discharge Frequent or constant Supervision/Assistance  Patient can return home with the following  A lot of help with walking and/or transfers;A lot of help with bathing/dressing/bathroom;Assistance with cooking/housework;Direct supervision/assist for medications management;Assist for transportation;Help with stairs or ramp for entrance   Equipment Recommendations  Other (comment) (defer)    Recommendations for Other Services       Precautions / Restrictions Precautions Precautions: Fall Precaution Comments: ck sats, NG tube, watch BP Restrictions Weight Bearing Restrictions: No RLE Weight Bearing: Weight bearing as tolerated       Mobility Bed Mobility Overal bed mobility: Needs Assistance Bed Mobility: Sit to Supine       Sit to supine: Max assist, +2 for physical assistance   General bed mobility comments: max A for BLEs into bed, scooting back in bed    Transfers Overall transfer level: Needs assistance Equipment used: 2 person hand held assist Antony Salmon) Transfers: Sit to/from Stand, Bed to chair/wheelchair/BSC Sit to Stand: Mod assist, +2 physical assistance, +2 safety/equipment Stand pivot transfers: Mod assist, From elevated surface         General transfer comment: mod A x2 using stedy from recliner, able to perform STS from elevated surfaces using Stedy with minimal assistance. Transfer via Lift Equipment: Stedy   Balance Overall balance assessment: Needs assistance Sitting-balance support: Feet supported, Single extremity supported Sitting balance-Leahy Scale: Fair Sitting balance - Comments: fair on EOB once at edge and feet placed   Standing balance support: Bilateral upper extremity supported, Reliant on assistive device for balance, During functional activity Standing balance-Leahy Scale: Poor Standing balance comment: stedy to chair                           ADL either performed or assessed with clinical judgement   ADL  Extremity/Trunk Assessment Upper Extremity Assessment Upper Extremity Assessment: Overall WFL for tasks assessed            Vision       Perception     Praxis      Cognition Arousal/Alertness: Awake/alert Behavior During Therapy: WFL for tasks assessed/performed Overall Cognitive Status: Within Functional Limits for tasks assessed                                           Exercises Exercises: Other exercises Other Exercises Other Exercises: Knee raises/hip flexion, 2X10 AROM (minimal AROM with knee raises sitting) Other Exercises: knee extension, sitting, 2X10 AROM    Shoulder Instructions       General Comments      Pertinent Vitals/ Pain       Pain Assessment Pain Assessment: Faces Faces Pain Scale: Hurts a little bit Pain Location: pt Rt hip, more fear/anxiety about anticipation of pain Pain Descriptors / Indicators: Guarding, Discomfort Pain Intervention(s): Monitored during session  Home Living                                          Prior Functioning/Environment              Frequency  Min 2X/week        Progress Toward Goals  OT Goals(current goals can now be found in the care plan section)  Progress towards OT goals: Progressing toward goals  Acute Rehab OT Goals Patient Stated Goal: to improve BLE strength OT Goal Formulation: With patient/family Time For Goal Achievement: 06/29/22 Potential to Achieve Goals: Good ADL Goals Pt Will Perform Lower Body Dressing: with min assist;with adaptive equipment;sitting/lateral leans Pt Will Transfer to Toilet: with mod assist;stand pivot transfer;bedside commode Pt Will Perform Toileting - Clothing Manipulation and hygiene: with min assist;with adaptive equipment;sitting/lateral leans Additional ADL Goal #1: Pt will be able to perform supine to sit to prepare for transfer or sitting ADLs with min A  Plan Discharge plan remains appropriate    Co-evaluation                 AM-PAC OT "6 Clicks" Daily Activity     Outcome Measure   Help from another person eating meals?: A Little Help from another person taking care of personal grooming?: A Little Help from another person toileting, which includes using toliet, bedpan, or urinal?: A Lot Help from another person bathing (including washing, rinsing, drying)?: A Lot Help from another  person to put on and taking off regular upper body clothing?: A Lot Help from another person to put on and taking off regular lower body clothing?: Total 6 Click Score: 13    End of Session Equipment Utilized During Treatment: Gait belt;Other (comment) Antony Salmon)  OT Visit Diagnosis: Unsteadiness on feet (R26.81);Other abnormalities of gait and mobility (R26.89);Muscle weakness (generalized) (M62.81);History of falling (Z91.81);Pain Pain - Right/Left: Right Pain - part of body: Hip   Activity Tolerance Patient tolerated treatment well   Patient Left in bed;with call bell/phone within reach;with family/visitor present   Nurse Communication Mobility status        Time: 1610-9604 OT Time Calculation (min): 29 min  Charges: OT General Charges $OT Visit: 1 Visit OT Treatments $Therapeutic Activity: 8-22 mins $Therapeutic Exercise: 8-22 mins  Shanay Woolman  [employer], OTR/L   Alexis Goodell 06/22/2022, 5:31 PM

## 2022-06-22 NOTE — Telephone Encounter (Signed)
Rx(s) sent to pharmacy electronically.  

## 2022-06-22 NOTE — Progress Notes (Signed)
8 Days Post-Op  Subjective: Much less bloating and no pain this AM. Passing more flatus. Had some small smears of BM overnight. She wants NGT out and to eat. She did have some nausea and bloating with clamping yesterday.   Objective: Vital signs in last 24 hours: Temp:  [98.2 F (36.8 C)-98.5 F (36.9 C)] 98.2 F (36.8 C) (06/11 0726) Pulse Rate:  [59-68] 63 (06/11 0923) Resp:  [16-18] 18 (06/11 0726) BP: (150-183)/(58-71) 174/71 (06/11 0923) SpO2:  [98 %-100 %] 99 % (06/11 0726) Last BM Date : 06/18/22  Intake/Output from previous day: 06/10 0701 - 06/11 0700 In: -  Out: 1850 [Urine:850; Emesis/NG output:1000] Intake/Output this shift: No intake/output data recorded.  PE: Gen:  Alert, NAD, pleasant Abd: Mild distension but very soft and completely NT. normoactive BS. NGT in place w/ thinner bilious output.   Lab Results:  Recent Labs    06/21/22 0246 06/22/22 0619  WBC 12.2* 12.8*  HGB 7.7* 8.2*  HCT 24.0* 26.6*  PLT 176 419*    BMET Recent Labs    06/21/22 0246 06/22/22 0208  NA 139 141  K 4.5 4.3  CL 102 106  CO2 29 29  GLUCOSE 131* 139*  BUN 41* 34*  CREATININE 1.75* 1.69*  CALCIUM 7.9* 8.0*    PT/INR No results for input(s): "LABPROT", "INR" in the last 72 hours. CMP     Component Value Date/Time   NA 141 06/22/2022 0208   NA 138 07/21/2020 1519   K 4.3 06/22/2022 0208   CL 106 06/22/2022 0208   CO2 29 06/22/2022 0208   GLUCOSE 139 (H) 06/22/2022 0208   BUN 34 (H) 06/22/2022 0208   BUN 30 (H) 07/21/2020 1519   CREATININE 1.69 (H) 06/22/2022 0208   CALCIUM 8.0 (L) 06/22/2022 0208   PROT 4.8 (L) 06/22/2022 0208   ALBUMIN 2.5 (L) 06/22/2022 0208   AST 17 06/22/2022 0208   ALT 16 06/22/2022 0208   ALKPHOS 76 06/22/2022 0208   BILITOT 0.9 06/22/2022 0208   GFRNONAA 31 (L) 06/22/2022 0208   GFRAA  09/17/2008 1612    >60        The eGFR has been calculated using the MDRD equation. This calculation has not been validated in all  clinical situations. eGFR's persistently <60 mL/min signify possible Chronic Kidney Disease.   Lipase     Component Value Date/Time   LIPASE 21 12/12/2019 0811    Studies/Results: DG Abd Portable 1V  Result Date: 06/21/2022 CLINICAL DATA:  Ileus. EXAM: PORTABLE ABDOMEN - 1 VIEW COMPARISON:  Abdominal x-ray from yesterday. FINDINGS: Unchanged enteric tube in the stomach. Mildly dilated loops of air-filled small bowel in the central abdomen are unchanged. Oral contrast continues to advanced through the colon. IMPRESSION: 1. Unchanged partial small bowel obstruction. Electronically Signed   By: Obie Dredge M.D.   On: 06/21/2022 10:01    Anti-infectives: Anti-infectives (From admission, onward)    Start     Dose/Rate Route Frequency Ordered Stop   06/14/22 1200  ceFAZolin (ANCEF) IVPB 2g/100 mL premix       Note to Pharmacy: Dr. Carola Frost reviewed allergies, wants Ancef to be given   2 g 200 mL/hr over 30 Minutes Intravenous  Once 06/14/22 1147 06/14/22 1304        Assessment/Plan Ileus vs pSBO - Hx Hysterectomy - CT 6/8 w/ distended small bowel loops w/o obvious transition point.  - Afebrile. No tachycardia or hypotension. WBC downtrending. No peritonitis on exam.  No indication for emergency surgery.  - Contrast in colon to descending colon/rectum on yesterday's film - repeat film today - likely clamp and allow CLD today and remove NGT if she is tolerating  - pending film today likely try suppository vs enema  - Mobilize as able for bowel function. She did recently have R Hip IMN - WBAT per Ortho notes.  - Keep K > 4 and Mg > 2 for bowel function - We will follow with you.   FEN - ice chips, NGT clamping trials, IVF per primary  VTE - SCDs, plavix resumed today  ID - None currently.   I reviewed nursing notes, hospitalist notes, last 24 h vitals and pain scores, last 48 h intake and output, last 24 h labs and trends, and last 24 h imaging results.    LOS: 10 days     Juliet Rude , [facility] 06/22/2022, 9:47 AM Please see Amion for pager number during day hours 7:00am-4:30pm

## 2022-06-22 NOTE — Progress Notes (Signed)
Physical Therapy Treatment Patient Details Name: Chelsea [name] MRN: [id number] DOB: [birth year] Today's Date: 06/22/2022   History of Present Illness 77 yo female with onset of mechanical fall was admitted 6/1 for management. Had R intertrochanteric fracture with IM nailing done 6/3.  Has been walking only short trips at baseline, SOB with pt requiring HOB elevated at night. PMHx:  bronchitis, Stroke, R hemiparesis, LBBB, COPD, carotid stenosis, HTN, MI, L eye blind, DM, R hippocampus infarct    PT Comments    Patient resting in bed at start of session and eager to mobilize with therapy to get OOB. Pt demonstrated good initiation to reach for bed rail and required mod assist to move to EOB. Pt with slight posterior lean at EOB and 1x LOB while seated requiring mod-max assist to correct. Pt performed 1x sit<>stand with 2HHA Mod and then completed sit<>stand with Stedy and min-mod assist +2 for safety. Pt transfer bed>chair and once in recliner repositioned for comfort. Educated on ankle pumps for circulation and pt demonstrated ankle pumps, windshield wipers, and alphabet with bil ankles. Will continue to progress as able.    Recommendations for follow up therapy are one component of a multi-disciplinary discharge planning process, led by the attending physician.  Recommendations may be updated based on patient status, additional functional criteria and insurance authorization.  Follow Up Recommendations  Can patient physically be transported by private vehicle: No    Assistance Recommended at Discharge Frequent or constant Supervision/Assistance  Patient can return home with the following Two people to help with walking and/or transfers;A lot of help with bathing/dressing/bathroom;Assistance with cooking/housework;Assist for transportation;Help with stairs or ramp for entrance   Equipment Recommendations  None recommended by PT    Recommendations for Other Services       Precautions /  Restrictions Precautions Precautions: Fall Precaution Comments: ck sats, NG tube, watch BP Restrictions Weight Bearing Restrictions: No RLE Weight Bearing: Weight bearing as tolerated     Mobility  Bed Mobility Overal bed mobility: Needs Assistance Bed Mobility: Supine to Sit     Supine to sit: Mod assist, HOB elevated     General bed mobility comments: cues to use bed rail with bil UE, pt able to reach with Rt then Lt and pt able to initiate moving Lt LE to EOB with Mod assist to bring Rt LE to edge. Mod assist for trunk to sit upright. Pt with 1x LOB posterior while sitting EOB with Mod-Max assist to regain seated balance.    Transfers Overall transfer level: Needs assistance Equipment used: 2 person hand held assist Transfers: Sit to/from Stand Sit to Stand: Mod assist, +2 physical assistance, +2 safety/equipment           General transfer comment: Mod +2HHA for sit<>stand from EOB with PT/RN. therapist blocking Rt knee for safety, no buckling in standing. pt completed sit<>stand with Stedy, min-mod assist +2 for safety with power up to stedy, manual assist to weight shift Rt/Lt for paddle clearance. Pt transfer bed>chair and positioned with pillows for comfort once in recliner. Transfer via Lift Equipment: Stedy  Ambulation/Gait                   Stairs             Wheelchair Mobility    Modified Rankin (Stroke Patients Only)       Balance Overall balance assessment: Needs assistance Sitting-balance support: Feet supported, Single extremity supported Sitting balance-Leahy Scale: Fair Sitting balance - Comments:  fair once set   Standing balance support: Bilateral upper extremity supported, Reliant on assistive device for balance, During functional activity Standing balance-Leahy Scale: Poor                              Cognition Arousal/Alertness: Lethargic, Awake/alert Behavior During Therapy: WFL for tasks  assessed/performed Overall Cognitive Status: Within Functional Limits for tasks assessed                                 General Comments: pt with improved cognition this date and daughters appear to agree. pt motivated and eager to work with therapy.        Exercises General Exercises - Lower Extremity Ankle Circles/Pumps: AROM, Both, 20 reps    General Comments        Pertinent Vitals/Pain Pain Assessment Pain Assessment: Faces Faces Pain Scale: Hurts a little bit Pain Location: pt Rt hip, more fear/anxiety about anticipation of pain Pain Descriptors / Indicators: Guarding, Discomfort Pain Intervention(s): Limited activity within patient's tolerance, Monitored during session, Repositioned    Home Living                          Prior Function            PT Goals (current goals can now be found in the care plan section) Acute Rehab PT Goals Patient Stated Goal: to get up PT Goal Formulation: With patient/family Time For Goal Achievement: 06/29/22 Potential to Achieve Goals: Good Progress towards PT goals: Progressing toward goals    Frequency    Min 3X/week      PT Plan Current plan remains appropriate    Co-evaluation              AM-PAC PT "6 Clicks" Mobility   Outcome Measure  Help needed turning from your back to your side while in a flat bed without using bedrails?: A Lot Help needed moving from lying on your back to sitting on the side of a flat bed without using bedrails?: Total Help needed moving to and from a bed to a chair (including a wheelchair)?: Total Help needed standing up from a chair using your arms (e.g., wheelchair or bedside chair)?: Total Help needed to walk in hospital room?: Total Help needed climbing 3-5 steps with a railing? : Total 6 Click Score: 7    End of Session Equipment Utilized During Treatment: Gait belt Activity Tolerance: Patient tolerated treatment well;Patient limited by pain Patient  left: in chair;with call bell/phone within reach;with chair alarm set;with family/visitor present Nurse Communication: Mobility status PT Visit Diagnosis: Unsteadiness on feet (R26.81);Muscle weakness (generalized) (M62.81);Adult, failure to thrive (R62.7);Pain;Hemiplegia and hemiparesis Hemiplegia - Right/Left: Right Hemiplegia - dominant/non-dominant: Dominant Hemiplegia - caused by: Unspecified Pain - Right/Left: Right Pain - part of body: Hip     Time: 3086-5784 PT Time Calculation (min) (ACUTE ONLY): 24 min  Charges:  $Therapeutic Activity: 23-37 mins                     Wynn Maudlin, DPT Acute Rehabilitation Services Office [phone]  06/22/22 4:54 PM

## 2022-06-22 NOTE — TOC Progression Note (Signed)
Transition of Care [facility]) - Progression Note    Patient Details  Name: Chelsea [name] MRN: [id number] Date of Birth: 1945/01/24  Transition of Care [facility]) CM/SW Contact  Lorri Frederick, LCSW Phone Number: 06/22/2022, 11:36 AM  Clinical Narrative:   [facility]: Berkley Harvey still valid, pt not stable for DC yet.  Current Berkley Harvey is either good through tomorrow or Thursday.    CSW will continue to follow for DC.      Barriers to Discharge: Continued Medical Work up, SNF Pending bed offer  Expected Discharge Plan and Services In-house Referral: Clinical Social Work   Post Acute Care Choice: Skilled Nursing Facility Living arrangements for the past 2 months: Single Family Home                                       Social Determinants of Health (SDOH) Interventions SDOH Screenings   Food Insecurity: No Food Insecurity (06/13/2022)  Housing: Patient Declined (06/13/2022)  Transportation Needs: No Transportation Needs (06/13/2022)  Utilities: Not At Risk (06/13/2022)  Depression (PHQ2-9): High Risk (09/29/2020)  Tobacco Use: Medium Risk (06/15/2022)    Readmission Risk Interventions    06/27/2020    2:08 PM 12/05/2019    1:40 PM  Readmission Risk Prevention Plan  Transportation Screening Complete Complete  PCP or Specialist Appt within 3-5 Days  Complete  HRI or Home Care Consult Complete Complete  Social Work Consult for Recovery Care Planning/Counseling Complete Complete  Palliative Care Screening Not Applicable Not Applicable  Medication Review [employer])  Complete

## 2022-06-22 NOTE — Progress Notes (Signed)
Daily Progress Note   Patient Name: Chelsea [name]       Date: 06/22/2022 DOB: 10-Feb-1945  Age: 77 y.o. MRN#: [id number] Attending Physician: Harold Hedge, MD Primary Care Physician: Trisha Mangle, FNP Admit Date: 06/12/2022  Reason for Consultation/Follow-up: Establishing goals of care  Subjective: Patient is sitting in bed in NAD. She feels there has been improvement since yesterday. Daughters at bedside.  Length of Stay: 10  Current Medications: Scheduled Meds:   acetaminophen (TYLENOL) oral liquid 160 mg/5 mL  1,000 mg Per Tube TID   aspirin  81 mg Per Tube Daily   carvedilol  3.125 mg Per Tube BID WC   Chlorhexidine Gluconate Cloth  6 each Topical Daily   cholecalciferol  2,000 Units Per Tube Daily   clopidogrel  75 mg Per Tube Daily   docusate  100 mg Per Tube BID   Finerenone  1 tablet Oral Daily   guaiFENesin  600 mg Oral BID   insulin aspart  0-9 Units Subcutaneous Q4H   ondansetron (ZOFRAN) IV  4 mg Intravenous Q8H   pantoprazole (PROTONIX) IV  40 mg Intravenous Q24H   polyethylene glycol  17 g Oral Daily   rosuvastatin  5 mg Oral Daily   sodium bicarbonate  650 mg Per Tube Daily   sodium zirconium cyclosilicate  10 g Oral Once   tamsulosin  0.4 mg Oral QPM    Continuous Infusions:  sodium chloride 50 mL/hr at 06/22/22 0212    PRN Meds: albuterol, bisacodyl, guaiFENesin, HYDROcodone-acetaminophen, HYDROmorphone (DILAUDID) injection, menthol-cetylpyridinium **OR** phenol, metoCLOPramide (REGLAN) injection, senna-docusate, simethicone, sodium phosphate  Physical Exam Vitals reviewed.  HENT:     Nose:     Comments: NGT Cardiovascular:     Rate and Rhythm: Normal rate.  Pulmonary:     Effort: Pulmonary effort is normal.  Abdominal:     Palpations:  Abdomen is soft.  Skin:    General: Skin is warm and dry.  Neurological:     Mental Status: She is alert.  Psychiatric:        Mood and Affect: Mood normal.        Behavior: Behavior normal.             Vital Signs: BP (!) 163/55 (BP Location: Left Arm)   Pulse 68  Temp 98.2 F (36.8 C) (Oral)   Resp 18   Ht 5\' 7"  (1.702 m)   Wt 69.4 kg   SpO2 99%   BMI 23.96 kg/m  SpO2: SpO2: 99 % O2 Device: O2 Device: Room Air O2 Flow Rate: O2 Flow Rate (L/min): 2 L/min  Intake/output summary:  Intake/Output Summary (Last 24 hours) at 06/22/2022 1316 Last data filed at 06/22/2022 0851 Gross per 24 hour  Intake 240 ml  Output 1450 ml  Net -1210 ml   LBM: Last BM Date : 06/21/22 (smear per daughter) Baseline Weight: Weight: 69.4 kg Most recent weight: Weight: 69.4 kg       Palliative Assessment/Data: 30%      Patient Active Problem List   Diagnosis Date Noted   Ileus (HCC) 06/20/2022   Hyperkalemia 06/14/2022   PVD (peripheral vascular disease) (HCC) 06/13/2022   Wheezing 06/13/2022   Hip fracture, right, closed, initial encounter (HCC) 06/12/2022   Fall 06/12/2022   Hyponatremia 10/07/2020   AKI (acute kidney injury) on CKD IV 10/07/2020   Acute kidney injury superimposed on CKD (HCC) 10/07/2020   Dehydration with hyponatremia    Syncope 10/06/2020   History of CVA (cerebrovascular accident) 07/10/2020   Pure hypercholesterolemia 07/10/2020   Type 2 diabetes mellitus with complication, without long-term current use of insulin (HCC) 07/10/2020   Malnutrition of moderate degree 06/27/2020   CKD (chronic kidney disease), stage IV (HCC) 06/25/2020   CAP (community acquired pneumonia) 06/25/2020   Stenosis of left carotid artery 12/03/2019   Stroke [facility])    Pain    Acute ischemic right posterior cerebral artery (PCA) stroke (HCC) 11/09/2019   Essential hypertension    Controlled type 2 diabetes mellitus with hyperglycemia (HCC)    Postherpetic neuralgia    Controlled  type 2 diabetes mellitus with hyperglycemia, without long-term current use of insulin (HCC) 11/07/2019   Hypertensive urgency 11/07/2019   Stenosis of left internal carotid artery 11/07/2019   Intractable nausea and vomiting 11/07/2019   Nicotine dependence, cigarettes, uncomplicated 11/07/2019   HZV (herpes zoster virus) post herpetic neuralgia 11/07/2019   Weakness of right lower extremity 11/07/2019   CVA (cerebral vascular accident) (HCC) 11/07/2019    Palliative Care Assessment & Plan   Patient Profile: This 77 yrs old Female with DM, hx. 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 Admitted for R hip fracture requiring IM nail placement.   The Palliative care team has been asked to get involved for further goals of care conversations.   Assessment: Patient appears comfortable. She reports no nausea clamping of the tube. She was able to eat some clear foods without issue. Continues to have gas and reports small bowel movements since yesterday.  She reports some frustration with being in the hospital and in the bed. She does recognize the improvements from yesterday. She is eager to work with PT/OT/mobility specialist.  Recommendations/Plan: DNR Continue therapy with goal for improvement Continued PMT support  Goals of Care and Additional Recommendations: Limitations on Scope of Treatment: Use MOST form for guidance  Code Status:    Code Status Orders  (From admission, onward)           Start     Ordered   06/12/22 1840  Do not attempt resuscitation (DNR)  Continuous       Question Answer Comment  If patient has no  pulse and is not breathing Do Not Attempt Resuscitation   If patient has a pulse and/or is breathing: Medical Treatment Goals LIMITED ADDITIONAL INTERVENTIONS: Use medication/IV fluids and cardiac monitoring  as indicated; Do not use intubation or mechanical ventilation (DNI), also provide comfort medications.  Transfer to Progressive/Stepdown as indicated, avoid Intensive Care.   Consent: Discussion documented in EHR or advanced directives reviewed      06/12/22 1840           Code Status History     Date Active Date Inactive Code Status Order ID Comments User Context   10/07/2020 0414 10/10/2020 1708 Full Code [id number]  Chotiner, Claudean Severance, MD Inpatient   06/26/2020 0253 06/27/2020 2039 DNR [id number]  Luiz Iron, NP Inpatient   06/25/2020 2159 06/26/2020 0253 Full Code [id number]  Hillary Bow, DO ED   12/03/2019 1158 12/05/2019 2333 Full Code [id number]  Dara Lords, PA-C Inpatient   11/09/2019 1937 12/03/2019 0657 Full Code [id number]  Charlton Amor, PA-C Inpatient   11/09/2019 1937 11/09/2019 1937 Full Code [id number]  Lynnae Prude Inpatient   11/07/2019 0654 11/09/2019 1929 Full Code [id number]  Shalhoub, Deno Lunger, MD ED      Advance Directive Documentation    Flowsheet Row Most Recent Value  Type of Advance Directive Healthcare Power of Attorney  Pre-existing out of facility DNR order (yellow form or pink MOST form) --  "MOST" Form in Place? --       Prognosis:  Unable to determine  Discharge Planning: To Be Determined  Care plan was discussed with bedside RN.  Thank you for allowing the Palliative Medicine Team to assist in the care of this patient.  Time spent: 40 minutes   Detailed review of medical records ( labs, imaging, vital signs), medically appropriate exam, counseling and education to patient and her daughters, documenting clinical information, medication management, coordination of care.    Sherryll Burger, NP  Please contact Palliative Medicine Team phone at [phone] for questions and concerns.

## 2022-06-22 NOTE — Progress Notes (Signed)
Progress Note   Patient: Chelsea [name]:[id number] DOB: 1945/07/19 DOA: 06/12/2022     10 DOS: the patient was seen and examined on 06/22/2022 at 9:55AM      Brief hospital course: Chelsea [name] is a 77 y.o. 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  In the ER, radiograph showed RIGHT hip fracture   6/1: Admitted 6/2: Got peripheral nerve block 6/3: Got IM nail by Dr. Carola Frost, creatinine up to 2.8 6/4: Still orthostatic 6/10 : Output from the NG tube has decreased, clamping trial initiated today.  Patient is passing some flatulence. 6/11: Patient has not had a bowel movement yet, on repeat x-ray of the abdomen shows improvement in the small bowel distention, still contrast present throughout the colon.     Assessment and Plan: * Hip fracture, right, closed, initial encounter (HCC) S/p INTRAMEDULLARY (IM) NAIL INTERTROCHANTERIC, REPAIR OF RIGHT HIP FRACTURE 6/3 by Dr. Carola Frost - PT ordered - WBAT R leg with walker - Unrestricted ROM - Analgesia per ortho - DVT PPx SCDs and Plavix    Ileus (HCC) No BM since surgery, now developed distension, nausea in the last 48 hours.  Gen Surg consulted 6/8, CT showed ileus, NG placed Symtoms improving - Maintain NG, output decreased, clamping trial initiated - Appreciate Gen Surg expertise -  continue IV fluids - CMP does not show any significant electrolyte abnormality - Abdominal x-ray done today shows mild improvement in small bowel distention, still contrast present throughout the colon. - General Surgery  following advised to continue clamping trial of the NG tube.  Leukocytosis: - Patient has persistent leukocytosis, no fever - Check Procalcitonin - Currently does not seem to be having any overt signs of infection.    Hyperkalemia Resolved  Wheezing See prior  notes.  Respiratory status stable.  No prior formal diagnosis of COPD.  PVD (peripheral vascular disease) (HCC) - Continue aspirin, Crestor, Plavix    Fall    AKI (acute kidney injury) on CKD IV Cr baseline 2-2.2, today up to 2.8 preop.  Trended down to 1.69 with IV fluids. - Hold olmesartan, torsemide  - Avoid nephrotoxins  Pure hypercholesterolemia - Continue Crestor  History of CVA (cerebrovascular accident) - Continue aspirin, Plavix - Trend CBC - Continue Crestor  Malnutrition of moderate degree As evidenced by loss of subcutaneous muscle mass and fat diffusely  CKD (chronic kidney disease), stage IV (HCC) Cr baseline 2.0-2.2, see above  - Resume finerenone now that BP is better  Essential hypertension BP trending up  - Resume Coreg, finerenone - Hold ARB due to AKI - Hold torsemide   Controlled type 2 diabetes mellitus with hyperglycemia, without long-term current use of insulin (HCC) A1c 8.1%.  Diet controlled at baseline. Glucoses fairly good here. - Continue SS corrections          Subjective: Patient seen and examined at bedside today.  Patient denies having any abdominal pain nausea vomiting, NG tube output has decreased.  Intermittent clamping trial ordered by the general surgery team.  Abdominal x-ray done today shows some improvement and small bowel distention.     Physical Exam: BP (!) 143/54 (BP Location: Left Arm)   Pulse 63   Temp 98.5 F (36.9 C)   Resp 20   Ht 5\' 7"  (1.702 m)   Wt 69.4 kg  SpO2 96%   BMI 23.96 kg/m   Adult female, lying in bed, no acute distress, still weak and tired but oriented RRR, no murmurs, no pitting peripheral edema, no JVD Respiratory rate normal, lungs clear without rales or wheezes, diminished Abdomen soft, no focal tenderness, no significant distention Hard of hearing, face symmetric, speech fluent, memory slightly impaired but at baseline, mild tremor    Data Reviewed: Patient metabolic panel  shows creatinine stable at 1.8, potassium down to 4.9 CBC shows stable anemia    Family Communication: Daughter at the bedside    Disposition: Status is: Inpatient Patient was admitted for hip fracture, she is undergone repair, and at this point is awaiting discharge to rehab as soon as her ileus resolves, she is able to have the NG removed, and take oral diet        Author: Harold Hedge, MD 06/22/2022 3:58 PM  For on call review [email or web address].

## 2022-06-22 NOTE — Progress Notes (Signed)
Met with patient and daughter to offer support and encouragement.   Barrie Folk MS Ed.S, RN Palliative Medicine Team PMT phone: [phone]

## 2022-06-23 DIAGNOSIS — S72001A Fracture of unspecified part of neck of right femur, initial encounter for closed fracture: Secondary | ICD-10-CM | POA: Diagnosis not present

## 2022-06-23 DIAGNOSIS — K56609 Unspecified intestinal obstruction, unspecified as to partial versus complete obstruction: Secondary | ICD-10-CM | POA: Diagnosis not present

## 2022-06-23 DIAGNOSIS — S72144A Nondisplaced intertrochanteric fracture of right femur, initial encounter for closed fracture: Secondary | ICD-10-CM

## 2022-06-23 LAB — GLUCOSE, CAPILLARY
Glucose-Capillary: 144 mg/dL — ABNORMAL HIGH (ref 70–99)
Glucose-Capillary: 136 mg/dL — ABNORMAL HIGH (ref 70–99)
Glucose-Capillary: 209 mg/dL — ABNORMAL HIGH (ref 70–99)
Glucose-Capillary: 151 mg/dL — ABNORMAL HIGH (ref 70–99)
Glucose-Capillary: 171 mg/dL — ABNORMAL HIGH (ref 70–99)

## 2022-06-23 MED ORDER — SODIUM BICARBONATE 650 MG PO TABS
650.0000 mg | ORAL_TABLET | Freq: Every day | ORAL | Status: DC
  Administered 2022-06-23 – 2022-06-25 (×3): 650 mg via ORAL
  Filled 2022-06-23 (×3): qty 1

## 2022-06-23 MED ORDER — PSYLLIUM 95 % PO PACK
1.0000 | PACK | Freq: Every day | ORAL | Status: DC
  Filled 2022-06-23 (×3): qty 1

## 2022-06-23 MED ORDER — GUAIFENESIN 100 MG/5ML PO LIQD: 5.0000 mL | ORAL | Status: DC | PRN

## 2022-06-23 MED ORDER — ACETAMINOPHEN 160 MG/5ML PO SOLN
1000.0000 mg | Freq: Three times a day (TID) | ORAL | Status: DC
  Administered 2022-06-23 (×2): 1000 mg via ORAL
  Filled 2022-06-23 (×2): qty 40.6

## 2022-06-23 MED ORDER — DOCUSATE SODIUM 100 MG PO CAPS
100.0000 mg | ORAL_CAPSULE | Freq: Two times a day (BID) | ORAL | Status: DC
  Administered 2022-06-23: 100 mg via ORAL
  Filled 2022-06-23: qty 1

## 2022-06-23 MED ORDER — CLOPIDOGREL BISULFATE 75 MG PO TABS
75.0000 mg | ORAL_TABLET | Freq: Every day | ORAL | Status: DC
  Administered 2022-06-23 – 2022-06-25 (×3): 75 mg via ORAL
  Filled 2022-06-23 (×3): qty 1

## 2022-06-23 MED ORDER — CARVEDILOL 3.125 MG PO TABS
3.1250 mg | ORAL_TABLET | Freq: Two times a day (BID) | ORAL | Status: DC
  Administered 2022-06-23 – 2022-06-25 (×5): 3.125 mg via ORAL
  Filled 2022-06-23 (×5): qty 1

## 2022-06-23 MED ORDER — ASPIRIN 81 MG PO CHEW
81.0000 mg | CHEWABLE_TABLET | Freq: Every day | ORAL | Status: DC
  Administered 2022-06-23 – 2022-06-25 (×3): 81 mg via ORAL
  Filled 2022-06-23 (×3): qty 1

## 2022-06-23 MED ORDER — SENNOSIDES-DOCUSATE SODIUM 8.6-50 MG PO TABS
1.0000 | ORAL_TABLET | Freq: Two times a day (BID) | ORAL | Status: DC
  Administered 2022-06-23 – 2022-06-24 (×3): 1 via ORAL
  Filled 2022-06-23 (×3): qty 1

## 2022-06-23 NOTE — Progress Notes (Addendum)
9 Days Post-Op  Subjective: NGT is out. Reports feeling better. Had some jello this AM and is currently eating [place] ice. Denies nausea, reports she is having flatus and has had 2 BMs. States she feels less distended. States she has had issues with constipation ever since her stroke 3 years ago, was advised by GI to take miralax daily. Patient is motivated to work/walk with PT today.  Objective: Vital signs in last 24 hours: Temp:  [98.1 F (36.7 C)-99.1 F (37.3 C)] 99.1 F (37.3 C) (06/12 0741) Pulse Rate:  [61-68] 61 (06/12 0741) Resp:  [17-20] 17 (06/12 0741) BP: (140-174)/(42-71) 159/52 (06/12 0741) SpO2:  [93 %-100 %] 96 % (06/12 0741) Last BM Date : 06/22/22  Intake/Output from previous day: 06/11 0701 - 06/12 0700 In: 480 [P.O.:240; NG/GT:240] Out: 700 [Urine:550; Emesis/NG output:150] Intake/Output this shift: No intake/output data recorded.  PE: Gen:  Alert, NAD, pleasant Abd: Mild distension but very soft and completely NT. normoactive BS.   Lab Results:  Recent Labs    06/21/22 0246 06/22/22 0619  WBC 12.2* 12.8*  HGB 7.7* 8.2*  HCT 24.0* 26.6*  PLT 176 419*   BMET Recent Labs    06/21/22 0246 06/22/22 0208  NA 139 141  K 4.5 4.3  CL 102 106  CO2 29 29  GLUCOSE 131* 139*  BUN 41* 34*  CREATININE 1.75* 1.69*  CALCIUM 7.9* 8.0*   PT/INR No results for input(s): "LABPROT", "INR" in the last 72 hours. CMP     Component Value Date/Time   NA 141 06/22/2022 0208   NA 138 07/21/2020 1519   K 4.3 06/22/2022 0208   CL 106 06/22/2022 0208   CO2 29 06/22/2022 0208   GLUCOSE 139 (H) 06/22/2022 0208   BUN 34 (H) 06/22/2022 0208   BUN 30 (H) 07/21/2020 1519   CREATININE 1.69 (H) 06/22/2022 0208   CALCIUM 8.0 (L) 06/22/2022 0208   PROT 4.8 (L) 06/22/2022 0208   ALBUMIN 2.5 (L) 06/22/2022 0208   AST 17 06/22/2022 0208   ALT 16 06/22/2022 0208   ALKPHOS 76 06/22/2022 0208   BILITOT 0.9 06/22/2022 0208   GFRNONAA 31 (L) 06/22/2022 0208   GFRAA   09/17/2008 1612    >60        The eGFR has been calculated using the MDRD equation. This calculation has not been validated in all clinical situations. eGFR's persistently <60 mL/min signify possible Chronic Kidney Disease.   Lipase     Component Value Date/Time   LIPASE 21 12/12/2019 0811    Studies/Results: DG Abd Portable 1V  Result Date: 06/22/2022 CLINICAL DATA:  Ileus. EXAM: PORTABLE ABDOMEN - 1 VIEW COMPARISON:  Radiographs 06/21/2022, 06/20/2022 and 06/19/2022. CT 06/19/2022. FINDINGS: 1019 hours. Two views are submitted. Unchanged position of the enteric tube, projecting over the proximal stomach. Contrast material remains within decompressed colon. Previously demonstrated small bowel distension has improved. No extravasated enteric contrast or pneumoperitoneum. There is mild atelectasis and small effusions at both lung bases. IMPRESSION: Interval improvement in small bowel distension. No evidence of bowel obstruction. Enteric tube remains in place. Electronically Signed   By: Carey Bullocks M.D.   On: 06/22/2022 14:16    Anti-infectives: Anti-infectives (From admission, onward)    Start     Dose/Rate Route Frequency Ordered Stop   06/14/22 1200  ceFAZolin (ANCEF) IVPB 2g/100 mL premix       Note to Pharmacy: Dr. Carola Frost reviewed allergies, wants Ancef to be given  2 g 200 mL/hr over 30 Minutes Intravenous  Once 06/14/22 1147 06/14/22 1304        Assessment/Plan Ileus vs pSBO - Hx Hysterectomy - CT 6/8 w/ distended small bowel loops w/o obvious transition point.  - Afebrile. No tachycardia or hypotension. WBC downtrending. No peritonitis on exam. No indication for emergency surgery.  - Contrast in colon to descending colon/rectum on yesterday's film, repeat X-ray yesterday with non-obstructive pattern, less small bowel distention. - NG out, pt tolerating clears. Advance to FLD then to soft as tolerated. - at discharge patient would benefit from a daily bowel  regimen. Recommend Senna-S BID, daily miralax, and daily fiber supplement with plenty of water (8-16 oz with the fiber). If bowel movements become loose/too frequent then can change miralax to PRN.  - Mobilize as able for bowel function. She did recently have R Hip IMN - WBAT per Ortho notes.  - Keep K > 4 and Mg > 2 for bowel function  FEN - FLD, ADAT to soft,  IVF per primary  VTE - SCDs, plavix resumed yesterday ID - None currently.   I reviewed nursing notes, hospitalist notes, last 24 h vitals and pain scores, last 48 h intake and output, last 24 h labs and trends, and last 24 h imaging results.    LOS: 11 days    Adam Phenix , [facility] 06/23/2022, 8:46 AM Please see Amion for pager number during day hours 7:00am-4:30pm

## 2022-06-23 NOTE — Progress Notes (Signed)
Nutrition Follow-up  DOCUMENTATION CODES:   Not applicable  INTERVENTION:  Carnation Breakfast Essentials TID, each packet mixed with 8 ounces of 2% milk provides 13 grams of protein and 260 calories. Magic cup TID with meals, each supplement provides 290 kcal and 9 grams of protein Monitor for ongoing diet tolerance  NUTRITION DIAGNOSIS:   Increased nutrient needs related to hip fracture, post-op healing as evidenced by estimated needs. - remains ongoing  GOAL:   Patient will meet greater than or equal to 90% of their needs - goal unmet  MONITOR:   Diet advancement, Supplement acceptance, Weight trends, Labs, Skin  REASON FOR ASSESSMENT:   Consult Hip fracture protocol  ASSESSMENT:   Pt admitted with R hip fracture following a fall. PMH significant for CVA with residual chronic R sided paresis (2021), HTN, T2DM, HLD, CKD stage IV, L sided carotid stenosis s/p CEA.  6/3 - s/p IMN of R hip intertrochanteric fracture 6/8 - NGT to LIS d/t ileus 6/11 - NGT removed; diet advanced to clear liquids 6/12 - diet advanced to full liquids  No documented meal completions on file to review since diet advancement.   Spoke with pt at bedside. She endorses feeling much better without nausea or emesis. She is pleased to have NGT removed but is afraid of recurrent emesis so is trying to advance diet slowly. She had jello and 2 ice pops for breakfast which she tolerated well. She has previously tried Ensure and states that she did not like it very much and is fearful it will make her throw up. She is agreeable to trying carnation instant breakfast to help increase nutritional adequacy and protein intake.   Medications: Vitamin D3 2000 units daily, colace, SSI 0-9 units q4h, protonix, miralax, psyllium, senna, sodium bicarbonate  IV drips: NaCl @50ml /hr  Labs: CBG's 136-209 x24 hours   Diet Order:   Diet Order             Diet full liquid Fluid consistency: Thin  Diet effective now                    EDUCATION NEEDS:   Education needs have been addressed  Skin:  Skin Assessment: Reviewed RN Assessment (R hip incision closed)  Last BM:  6/11 x3 (type 1-smear, type 5 small& medium)  Height:   Ht Readings from Last 1 Encounters:  06/14/22 5\' 7"  (1.702 m)    Weight:   Wt Readings from Last 1 Encounters:  06/14/22 69.4 kg   BMI:  Body mass index is 23.96 kg/m.  Estimated Nutritional Needs:   Kcal:  1700-1900  Protein:  85-100g  Fluid:  >/=1.7L  Drusilla Kanner, RDN, LDN Clinical Nutrition

## 2022-06-23 NOTE — Progress Notes (Signed)
Daily Progress Note   Patient Name: Chelsea [name]       Date: 06/23/2022 DOB: Aug 03, 1945  Age: 77 y.o. MRN#: [id number] Attending Physician: Jerald Kief, MD Primary Care Physician: Trisha Mangle, FNP Admit Date: 06/12/2022  Reason for Consultation/Follow-up: Establishing goals of care  Subjective: Patient is sitting in bed taking her morning medications. Her daughter is at bedside.  Length of Stay: 11  Current Medications: Scheduled Meds:   acetaminophen (TYLENOL) oral liquid 160 mg/5 mL  1,000 mg Oral TID   aspirin  81 mg Oral Daily   carvedilol  3.125 mg Oral BID WC   Chlorhexidine Gluconate Cloth  6 each Topical Daily   cholecalciferol  2,000 Units Per Tube Daily   clopidogrel  75 mg Oral Daily   docusate sodium  100 mg Oral BID   Finerenone  1 tablet Oral Daily   guaiFENesin  600 mg Oral BID   insulin aspart  0-9 Units Subcutaneous Q4H   pantoprazole (PROTONIX) IV  40 mg Intravenous Q24H   polyethylene glycol  17 g Oral Daily   psyllium  1 packet Oral Daily   rosuvastatin  5 mg Oral Daily   senna-docusate  1 tablet Oral BID   sodium bicarbonate  650 mg Oral Daily   tamsulosin  0.4 mg Oral QPM    Continuous Infusions:  sodium chloride 50 mL/hr at 06/23/22 0639    PRN Meds: albuterol, bisacodyl, guaiFENesin, HYDROcodone-acetaminophen, HYDROmorphone (DILAUDID) injection, menthol-cetylpyridinium **OR** phenol, metoCLOPramide (REGLAN) injection, simethicone, sodium phosphate  Physical Exam Vitals reviewed.  HENT:     Head: Normocephalic and atraumatic.  Cardiovascular:     Rate and Rhythm: Normal rate.  Pulmonary:     Effort: Pulmonary effort is normal.  Abdominal:     Palpations: Abdomen is soft.  Skin:    General: Skin is warm and dry.  Neurological:      Mental Status: She is alert.  Psychiatric:        Mood and Affect: Mood normal.        Behavior: Behavior normal.             Vital Signs: BP (!) 140/50 (BP Location: Right Arm)   Pulse 63   Temp 97.9 F (36.6 C) (Oral)   Resp 16   Ht 5\' 7"  (1.702  m)   Wt 69.4 kg   SpO2 97%   BMI 23.96 kg/m  SpO2: SpO2: 97 % O2 Device: O2 Device: Room Air O2 Flow Rate: O2 Flow Rate (L/min): 2 L/min  Intake/output summary:  Intake/Output Summary (Last 24 hours) at 06/23/2022 1353 Last data filed at 06/23/2022 0900 Gross per 24 hour  Intake 360 ml  Output 150 ml  Net 210 ml   LBM: Last BM Date : 06/22/22 Baseline Weight: Weight: 69.4 kg Most recent weight: Weight: 69.4 kg       Palliative Assessment/Data: 40%      Patient Active Problem List   Diagnosis Date Noted   Ileus (HCC) 06/20/2022   Hyperkalemia 06/14/2022   PVD (peripheral vascular disease) (HCC) 06/13/2022   Wheezing 06/13/2022   Hip fracture, right, closed, initial encounter (HCC) 06/12/2022   Fall 06/12/2022   Hyponatremia 10/07/2020   AKI (acute kidney injury) on CKD IV 10/07/2020   Acute kidney injury superimposed on CKD (HCC) 10/07/2020   Dehydration with hyponatremia    Syncope 10/06/2020   History of CVA (cerebrovascular accident) 07/10/2020   Pure hypercholesterolemia 07/10/2020   Type 2 diabetes mellitus with complication, without long-term current use of insulin (HCC) 07/10/2020   Malnutrition of moderate degree 06/27/2020   CKD (chronic kidney disease), stage IV (HCC) 06/25/2020   CAP (community acquired pneumonia) 06/25/2020   Stenosis of left carotid artery 12/03/2019   Stroke [facility])    Pain    Acute ischemic right posterior cerebral artery (PCA) stroke (HCC) 11/09/2019   Essential hypertension    Controlled type 2 diabetes mellitus with hyperglycemia (HCC)    Postherpetic neuralgia    Controlled type 2 diabetes mellitus with hyperglycemia, without long-term current use of insulin (HCC)  11/07/2019   Hypertensive urgency 11/07/2019   Stenosis of left internal carotid artery 11/07/2019   Intractable nausea and vomiting 11/07/2019   Nicotine dependence, cigarettes, uncomplicated 11/07/2019   HZV (herpes zoster virus) post herpetic neuralgia 11/07/2019   Weakness of right lower extremity 11/07/2019   CVA (cerebral vascular accident) (HCC) 11/07/2019    Palliative Care Assessment & Plan   Patient Profile: This 77 yrs old Female with DM, hx. 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 Admitted for R hip fracture requiring IM nail placement.   The Palliative care team has been asked to get involved for further goals of care conversations.   Assessment: Patient appears more comfortable with her NG tube out. She says she has been able to tolerate her diet without nausea. She had 2 good bowel movements since yesterday. She says she was very fatigued after her PT/OT yesterday. She wants to get up and out of the bed. She is nervous about being discharged prematurely. We discussed all her progress, even in the last few days and her determination to improve her functional status.   Recommendations/Plan: DNR Continue therapy with goal for improvement Continued PMT support  Goals of Care and Additional Recommendations: Limitations on Scope of Treatment: Use MOST form for guidance  Code Status:    Code Status Orders  (From admission, onward)           Start     Ordered   06/12/22 1840  Do not attempt resuscitation (DNR)  Continuous       Question Answer Comment  If patient has no pulse and is not  breathing Do Not Attempt Resuscitation   If patient has a pulse and/or is breathing: Medical Treatment Goals LIMITED ADDITIONAL INTERVENTIONS: Use medication/IV fluids and cardiac monitoring as indicated; Do not use intubation or  mechanical ventilation (DNI), also provide comfort medications.  Transfer to Progressive/Stepdown as indicated, avoid Intensive Care.   Consent: Discussion documented in EHR or advanced directives reviewed      06/12/22 1840           Code Status History     Date Active Date Inactive Code Status Order ID Comments User Context   10/07/2020 0414 10/10/2020 1708 Full Code [id number]  Chotiner, Claudean Severance, MD Inpatient   06/26/2020 0253 06/27/2020 2039 DNR [id number]  Luiz Iron, NP Inpatient   06/25/2020 2159 06/26/2020 0253 Full Code [id number]  Hillary Bow, DO ED   12/03/2019 1158 12/05/2019 2333 Full Code [id number]  Dara Lords, PA-C Inpatient   11/09/2019 1937 12/03/2019 0657 Full Code [id number]  Charlton Amor, PA-C Inpatient   11/09/2019 1937 11/09/2019 1937 Full Code [id number]  Lynnae Prude Inpatient   11/07/2019 0654 11/09/2019 1929 Full Code [id number]  Shalhoub, Deno Lunger, MD ED      Advance Directive Documentation    Flowsheet Row Most Recent Value  Type of Advance Directive Healthcare Power of Attorney  Pre-existing out of facility DNR order (yellow form or pink MOST form) --  "MOST" Form in Place? --       Prognosis:  Unable to determine  Discharge Planning: To Be Determined   Time spent: 40 minutes   Detailed review of medical records ( labs, imaging, vital signs), medically appropriate exam, counseling and education to patient and her daughters, documenting clinical information, medication management, coordination of care.     Thank you for allowing the Palliative Medicine Team to assist in the care of this patient.    Sherryll Burger, NP  Please contact Palliative Medicine Team phone at [phone] for questions and concerns.

## 2022-06-23 NOTE — TOC Progression Note (Signed)
Transition of Care [facility]) - Progression Note    Patient Details  Name: Chelsea [name] MRN: [id number] Date of Birth: 11-28-1945  Transition of Care [facility] CM/SW Contact  Lorri Frederick, LCSW Phone Number: 06/23/2022, 12:00 PM  Clinical Narrative:   CSW spoke with Connie/HTA.  Pt Berkley Harvey was approved on Sat 6/8.  Per Junious Dresser, pt has 5 business days to admit, which starts 6/10.  Last day pt can admit under this auth is Friday 6/14.  CSW LM with Garth Schlatter, covering for Meryle Ready at [facility] that pt will not DC today, potential DC in next 1-2 days, per MD.        Barriers to Discharge: Continued Medical Work up, SNF Pending bed offer  Expected Discharge Plan and Services In-house Referral: Clinical Social Work   Post Acute Care Choice: Skilled Nursing Facility Living arrangements for the past 2 months: Single Family Home                                       Social Determinants of Health (SDOH) Interventions SDOH Screenings   Food Insecurity: No Food Insecurity (06/13/2022)  Housing: Patient Declined (06/13/2022)  Transportation Needs: No Transportation Needs (06/13/2022)  Utilities: Not At Risk (06/13/2022)  Depression (PHQ2-9): High Risk (09/29/2020)  Tobacco Use: Medium Risk (06/15/2022)    Readmission Risk Interventions    06/27/2020    2:08 PM 12/05/2019    1:40 PM  Readmission Risk Prevention Plan  Transportation Screening Complete Complete  PCP or Specialist Appt within 3-5 Days  Complete  HRI or Home Care Consult Complete Complete  Social Work Consult for Recovery Care Planning/Counseling Complete Complete  Palliative Care Screening Not Applicable Not Applicable  Medication Review [employer])  Complete

## 2022-06-23 NOTE — Progress Notes (Signed)
Mobility Specialist Progress Note:    06/23/22 1422  Mobility  Activity Transferred from bed to chair  Level of Assistance +2 (takes two people)  Assistive Device Stedy  Activity Response Tolerated well  Mobility Referral Yes  $Mobility charge 1 Mobility  Mobility Specialist Start Time (ACUTE ONLY) 1350  Mobility Specialist Stop Time (ACUTE ONLY) 1420  Mobility Specialist Time Calculation (min) (ACUTE ONLY) 30 min   Pt received in bed, agreeable to transfer to chair. Pt required modA for bed mobility, ModA- MaxA +2, secondary to fatigue. Pt performed x5 STS and required pericare, secondary to BM. Transfer to chair via stedy and was left w/ all needs met. Call bell in reach and chair alarm on.     Thompson Grayer Mobility Specialist  Please contact vis Secure Chat or  Rehab Office [phone]

## 2022-06-23 NOTE — Progress Notes (Signed)
  Progress Note   Patient: Chelsea [name] ZOX:[id number] DOB: 13-Dec-1945 DOA: 06/12/2022     11 DOS: the patient was seen and examined on 06/23/2022   Brief hospital course: Chelsea [name] is a 77 y.o. 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  In the ER, radiograph showed RIGHT hip fracture   6/1: Admitted 6/2: Got peripheral nerve block 6/3: Got IM nail by Dr. Carola Frost, creatinine up to 2.8 6/4: Still orthostatic  Assessment and Plan: Hip fracture, right, closed, initial encounter (HCC) S/p INTRAMEDULLARY (IM) NAIL INTERTROCHANTERIC, REPAIR OF RIGHT HIP FRACTURE 6/3 by Dr. Carola Frost - PT/OT with recs for SNF on d/c - WBAT R leg with walker - Unrestricted ROM - Analgesia per ortho - DVT PPx SCDs and Plavix   Postop Ileus (HCC) -General Surgery following -Ileus is now resolving. Diet being advanced as tolerated -Pt reports passing stool and flatus -Cont to optimize lytes   Leukocytosis: - No obvious sign of infection - Remains afebrile -procalcitonin neg    Hyperkalemia Resolved   Wheezing -remains stable  -no auditory wheezing   PVD (peripheral vascular disease) (HCC) - Continue aspirin, Crestor, Plavix    Fall    AKI (acute kidney injury) on CKD IV Cr baseline 2-2.2, today up to 2.8 preop.  Trended down to 1.69 with IV fluids. - Holding  olmesartan, torsemide  - Avoid nephrotoxins   Pure hypercholesterolemia - Continue Crestor   History of CVA (cerebrovascular accident) - Continue aspirin, Plavix - Trend CBC - Continue Crestor   Malnutrition of moderate degree As evidenced by loss of subcutaneous muscle mass and fat diffusely   Essential hypertension BP trending up  -Cont Coreg, finerenone -holding ARB due to AKI - Holding torsemide per above   Controlled type 2 diabetes mellitus with  hyperglycemia, without long-term current use of insulin (HCC) A1c 8.1%.  Diet controlled at baseline. -Glycemic trends stable -Cont SSI as needed      Subjective: Tolerating liquid diet this AM, passing flatus  Physical Exam: Vitals:   06/22/22 2055 06/23/22 0649 06/23/22 0741 06/23/22 1329  BP: (!) 140/42 (!) 146/56 (!) 159/52 (!) 140/50  Pulse:   61 63  Resp: 18 18 17 16   Temp: 98.1 F (36.7 C) 98.4 F (36.9 C) 99.1 F (37.3 C) 97.9 F (36.6 C)  TempSrc:  Oral Oral Oral  SpO2:   96% 97%  Weight:      Height:       General exam: Awake, laying in bed, in nad Respiratory system: Normal respiratory effort, no wheezing Cardiovascular system: regular rate, s1, s2 Gastrointestinal system: Soft, nondistended, positive BS Central nervous system: CN2-12 grossly intact, strength intact Extremities: Perfused, no clubbing Skin: Normal skin turgor, no notable skin lesions seen Psychiatry: Mood normal // no visual hallucinations   Data Reviewed:  There are no new results to review at this time.  Family Communication: Pt in room, family at bedside  Disposition: Status is: Inpatient Remains inpatient appropriate because: Severity of illness  Planned Discharge Destination: Skilled nursing facility     Author: Rickey Barbara, MD 06/23/2022 3:39 PM  For on call review [email or web address].

## 2022-06-23 NOTE — Progress Notes (Signed)
Occupational Therapy Treatment Patient Details Name: Chelsea [name] MRN: [id number] DOB: 30-May-1945 Today's Date: 06/23/2022   History of present illness 77 yo female with onset of mechanical fall was admitted 6/1 for management. Had R intertrochanteric fracture with IM nailing done 6/3.  Has been walking only short trips at baseline, SOB with pt requiring HOB elevated at night. PMHx:  bronchitis, Stroke, R hemiparesis, LBBB, COPD, carotid stenosis, HTN, MI, L eye blind, DM, R hippocampus infarct   OT comments  Pt continuing to progress in OT sessions, pt needing Mod A +2 for STS and successfully completed 3/3 stands with assist of Stedy, still needing tactile cue to shift hips anteriorly for better standing positioning. Pt continuing to need mod A +2 for sit>sup and appears limited at end of session due to fatigue. Pt tolerating 45 secs of standing to allow for rear pericare, Max A to hold stand with use of Stedy with NT providing total A for rear pericare. OT to continue to progress pt as able. DC plans remain appropriate for SNF.    Recommendations for follow up therapy are one component of a multi-disciplinary discharge planning process, led by the attending physician.  Recommendations may be updated based on patient status, additional functional criteria and insurance authorization.    Assistance Recommended at Discharge Frequent or constant Supervision/Assistance  Patient can return home with the following  A lot of help with walking and/or transfers;A lot of help with bathing/dressing/bathroom;Assistance with cooking/housework;Direct supervision/assist for medications management;Assist for transportation;Help with stairs or ramp for entrance   Equipment Recommendations  Other (comment) (defer)    Recommendations for Other Services      Precautions / Restrictions Precautions Precautions: Fall Precaution Comments: ck sats, NG tube, watch BP Restrictions Weight Bearing Restrictions:  Yes RLE Weight Bearing: Weight bearing as tolerated       Mobility Bed Mobility Overal bed mobility: Needs Assistance Bed Mobility: Sit to Supine       Sit to supine: Max assist, +2 for physical assistance   General bed mobility comments: Pt fatiguing following bouts of STS. Pt rec'd sitting in recliner    Transfers Overall transfer level: Needs assistance Equipment used: Ambulation equipment used Transfers: Sit to/from Stand Sit to Stand: Mod assist, +2 physical assistance           General transfer comment: STS x3 with Mod A x2 with sara stedy, pt needing tactile cues to shift hips anteriorly to standing taller.     Balance Overall balance assessment: Needs assistance Sitting-balance support: Feet supported, Single extremity supported Sitting balance-Leahy Scale: Fair     Standing balance support: Bilateral upper extremity supported, Reliant on assistive device for balance, During functional activity Standing balance-Leahy Scale: Poor Standing balance comment: stedy                           ADL either performed or assessed with clinical judgement   ADL                                         General ADL Comments: Focused on functional transfer to return pt to bed    Extremity/Trunk Assessment              Vision       Perception     Praxis      Cognition Arousal/Alertness: Awake/alert Behavior  During Therapy: WFL for tasks assessed/performed Overall Cognitive Status: Within Functional Limits for tasks assessed                                 General Comments: Pt engaged and following commands. Upon fatigue pt with slower processing        Exercises      Shoulder Instructions       General Comments      Pertinent Vitals/ Pain       Pain Assessment Pain Assessment: Faces Faces Pain Scale: Hurts little more Pain Location: Rt hip Pain Descriptors / Indicators: Guarding, Discomfort Pain  Intervention(s): Monitored during session, Repositioned  Home Living                                          Prior Functioning/Environment              Frequency  Min 2X/week        Progress Toward Goals  OT Goals(current goals can now be found in the care plan section)  Progress towards OT goals: Progressing toward goals  Acute Rehab OT Goals Patient Stated Goal: to improve BLE strength OT Goal Formulation: With patient/family Time For Goal Achievement: 06/29/22 Potential to Achieve Goals: Good  Plan Discharge plan remains appropriate    Co-evaluation                 AM-PAC OT "6 Clicks" Daily Activity     Outcome Measure   Help from another person eating meals?: A Little Help from another person taking care of personal grooming?: A Little Help from another person toileting, which includes using toliet, bedpan, or urinal?: A Lot Help from another person bathing (including washing, rinsing, drying)?: A Lot Help from another person to put on and taking off regular upper body clothing?: A Lot Help from another person to put on and taking off regular lower body clothing?: Total 6 Click Score: 13    End of Session Equipment Utilized During Treatment: Gait belt;Other (comment) Antony Salmon)  OT Visit Diagnosis: Unsteadiness on feet (R26.81);Other abnormalities of gait and mobility (R26.89);Muscle weakness (generalized) (M62.81);History of falling (Z91.81);Pain Pain - Right/Left: Right Pain - part of body: Hip   Activity Tolerance Patient tolerated treatment well   Patient Left in bed;with call bell/phone within reach;with nursing/sitter in room;with family/visitor present   Nurse Communication Mobility status        Time: 1610-9604 OT Time Calculation (min): 9 min  Charges: OT General Charges $OT Visit: 1 Visit OT Treatments $Therapeutic Activity: 8-22 mins  06/23/2022  AB, OTR/L  Acute Rehabilitation Services  Office:  [phone]   Tristan Schroeder 06/23/2022, 3:43 PM

## 2022-06-24 DIAGNOSIS — Z515 Encounter for palliative care: Secondary | ICD-10-CM | POA: Diagnosis not present

## 2022-06-24 DIAGNOSIS — K56609 Unspecified intestinal obstruction, unspecified as to partial versus complete obstruction: Secondary | ICD-10-CM | POA: Diagnosis not present

## 2022-06-24 DIAGNOSIS — S72144A Nondisplaced intertrochanteric fracture of right femur, initial encounter for closed fracture: Secondary | ICD-10-CM | POA: Diagnosis not present

## 2022-06-24 DIAGNOSIS — S72001A Fracture of unspecified part of neck of right femur, initial encounter for closed fracture: Secondary | ICD-10-CM | POA: Diagnosis not present

## 2022-06-24 DIAGNOSIS — Z7189 Other specified counseling: Secondary | ICD-10-CM | POA: Diagnosis not present

## 2022-06-24 LAB — CBC
HCT: 25.5 % — ABNORMAL LOW (ref 36.0–46.0)
Hemoglobin: 8 g/dL — ABNORMAL LOW (ref 12.0–15.0)
MCH: 31.6 pg (ref 26.0–34.0)
MCHC: 31.4 g/dL (ref 30.0–36.0)
MCV: 100.8 fL — ABNORMAL HIGH (ref 80.0–100.0)
Platelets: 428 10*3/uL — ABNORMAL HIGH (ref 150–400)
RBC: 2.53 MIL/uL — ABNORMAL LOW (ref 3.87–5.11)
RDW: 12.8 % (ref 11.5–15.5)
WBC: 9.7 10*3/uL (ref 4.0–10.5)
nRBC: 0 % (ref 0.0–0.2)

## 2022-06-24 LAB — COMPREHENSIVE METABOLIC PANEL
ALT: 12 U/L (ref 0–44)
AST: 14 U/L — ABNORMAL LOW (ref 15–41)
Albumin: 2.4 g/dL — ABNORMAL LOW (ref 3.5–5.0)
Alkaline Phosphatase: 94 U/L (ref 38–126)
Anion gap: 7 (ref 5–15)
BUN: 27 mg/dL — ABNORMAL HIGH (ref 8–23)
CO2: 26 mmol/L (ref 22–32)
Calcium: 7.8 mg/dL — ABNORMAL LOW (ref 8.9–10.3)
Chloride: 104 mmol/L (ref 98–111)
Creatinine, Ser: 1.6 mg/dL — ABNORMAL HIGH (ref 0.44–1.00)
GFR, Estimated: 33 mL/min — ABNORMAL LOW (ref >60)
Glucose, Bld: 195 mg/dL — ABNORMAL HIGH (ref 70–99)
Potassium: 3.5 mmol/L (ref 3.5–5.1)
Sodium: 137 mmol/L (ref 135–145)
Total Bilirubin: 0.6 mg/dL (ref 0.3–1.2)
Total Protein: 4.8 g/dL — ABNORMAL LOW (ref 6.5–8.1)

## 2022-06-24 LAB — GLUCOSE, CAPILLARY
Glucose-Capillary: 113 mg/dL — ABNORMAL HIGH (ref 70–99)
Glucose-Capillary: 122 mg/dL — ABNORMAL HIGH (ref 70–99)
Glucose-Capillary: 145 mg/dL — ABNORMAL HIGH (ref 70–99)
Glucose-Capillary: 151 mg/dL — ABNORMAL HIGH (ref 70–99)
Glucose-Capillary: 174 mg/dL — ABNORMAL HIGH (ref 70–99)
Glucose-Capillary: 183 mg/dL — ABNORMAL HIGH (ref 70–99)

## 2022-06-24 MED ORDER — ACETAMINOPHEN 500 MG PO TABS
1000.0000 mg | ORAL_TABLET | Freq: Three times a day (TID) | ORAL | Status: DC
  Administered 2022-06-24 – 2022-06-25 (×3): 1000 mg via ORAL
  Filled 2022-06-24 (×4): qty 2

## 2022-06-24 MED ORDER — GERHARDT'S BUTT CREAM
TOPICAL_CREAM | Freq: Three times a day (TID) | CUTANEOUS | Status: DC
  Administered 2022-06-24: 1 via TOPICAL
  Filled 2022-06-24: qty 1

## 2022-06-24 MED ORDER — POLYETHYLENE GLYCOL 3350 17 G PO PACK
17.0000 g | PACK | Freq: Two times a day (BID) | ORAL | Status: DC
  Administered 2022-06-24: 17 g via ORAL
  Filled 2022-06-24: qty 1

## 2022-06-24 NOTE — Progress Notes (Signed)
Physical Therapy Treatment Patient Details Name: Chelsea [name] MRN: [id number] DOB: 1945/04/10 Today's Date: 06/24/2022   History of Present Illness 77 yo female with onset of mechanical fall was admitted 6/1 for management. Had R intertrochanteric fracture with IM nailing done 6/3.  Has been walking only short trips at baseline, SOB with pt requiring HOB elevated at night. PMHx:  bronchitis, Stroke, R hemiparesis, LBBB, COPD, carotid stenosis, HTN, MI, L eye blind, DM, R hippocampus infarct    PT Comments    Pt received in supine and agreeable to session. Pt able to improve mobility this session, however is limited by impaired activity tolerance and quick fatigue. Pt able to tolerate sitting EOB for ~5 mins and stand with min guard-mod A with the stedy. Pt noted to have bowel incontinence upon standing and returned to sitting EOB. Pt unable to reach an upright posture for second stand, however is able to clear hips for assist with pericare. Pt demonstrating limited standing tolerance. Pt requires increased assist to return to supine due to fatigue. Pt continues to benefit from PT services to progress toward functional mobility goals.     Recommendations for follow up therapy are one component of a multi-disciplinary discharge planning process, led by the attending physician.  Recommendations may be updated based on patient status, additional functional criteria and insurance authorization.     Assistance Recommended at Discharge Frequent or constant Supervision/Assistance  Patient can return home with the following Two people to help with walking and/or transfers;A lot of help with bathing/dressing/bathroom;Assistance with cooking/housework;Assist for transportation;Help with stairs or ramp for entrance   Equipment Recommendations  None recommended by PT    Recommendations for Other Services       Precautions / Restrictions Precautions Precautions: Fall Precaution Comments: ck sats,   watch BP Restrictions Weight Bearing Restrictions: Yes RLE Weight Bearing: Weight bearing as tolerated     Mobility  Bed Mobility Overal bed mobility: Needs Assistance Bed Mobility: Supine to Sit, Sit to Supine     Supine to sit: Mod assist Sit to supine: Max assist   General bed mobility comments: Mod A to advance RLE to EOB and for trunk elevation. Max A for BLE elevation to EOB and total A for supine scoot towards HOB.    Transfers Overall transfer level: Needs assistance Equipment used: Ambulation equipment used Transfers: Sit to/from Stand Sit to Stand: Min guard, Mod assist, From elevated surface           General transfer comment: STS from elevated EOB x2 initially with min guard progressing to mod A with increased fatigue. Pt unable to reach a full upright posture on second stand, but was able to clear hips for pericare.        Balance Overall balance assessment: Needs assistance Sitting-balance support: Feet supported, Single extremity supported Sitting balance-Leahy Scale: Fair Sitting balance - Comments: sitting EOB   Standing balance support: Bilateral upper extremity supported, Reliant on assistive device for balance, During functional activity Standing balance-Leahy Scale: Poor Standing balance comment: stedy support                            Cognition Arousal/Alertness: Awake/alert Behavior During Therapy: WFL for tasks assessed/performed Overall Cognitive Status: Within Functional Limits for tasks assessed  Exercises      General Comments General comments (skin integrity, edema, etc.): Pt's 2 daughters present and assisting as needed during session.      Pertinent Vitals/Pain Pain Assessment Pain Assessment: Faces Faces Pain Scale: Hurts little more Pain Location: Rt hip Pain Descriptors / Indicators: Guarding, Discomfort Pain Intervention(s): Monitored during session,  Repositioned     PT Goals (current goals can now be found in the care plan section) Acute Rehab PT Goals Patient Stated Goal: to get up PT Goal Formulation: With patient/family Time For Goal Achievement: 06/29/22 Potential to Achieve Goals: Good Progress towards PT goals: Progressing toward goals    Frequency    Min 3X/week      PT Plan Current plan remains appropriate       AM-PAC PT "6 Clicks" Mobility   Outcome Measure  Help needed turning from your back to your side while in a flat bed without using bedrails?: A Little Help needed moving from lying on your back to sitting on the side of a flat bed without using bedrails?: A Lot Help needed moving to and from a bed to a chair (including a wheelchair)?: Total Help needed standing up from a chair using your arms (e.g., wheelchair or bedside chair)?: A Lot Help needed to walk in hospital room?: Total Help needed climbing 3-5 steps with a railing? : Total 6 Click Score: 10    End of Session Equipment Utilized During Treatment: Gait belt Activity Tolerance: Patient tolerated treatment well;Patient limited by fatigue Patient left: with call bell/phone within reach;with family/visitor present;in bed;with nursing/sitter in room Nurse Communication: Mobility status PT Visit Diagnosis: Unsteadiness on feet (R26.81);Muscle weakness (generalized) (M62.81);Adult, failure to thrive (R62.7);Pain;Hemiplegia and hemiparesis Hemiplegia - Right/Left: Right Hemiplegia - dominant/non-dominant: Dominant Hemiplegia - caused by: Unspecified Pain - Right/Left: Right Pain - part of body: Hip     Time: 6962-9528 PT Time Calculation (min) (ACUTE ONLY): 23 min  Charges:  $Therapeutic Activity: 23-37 mins                     Johny Shock, PTA Acute Rehabilitation Services Secure Chat Preferred  [phone]    Johny Shock 06/24/2022, 4:23 PM

## 2022-06-24 NOTE — TOC Progression Note (Signed)
Transition of Care [facility]) - Progression Note    Patient Details  Name: Chelsea [name] MRN: [id number] Date of Birth: 06-May-1945  Transition of Care [facility]) CM/SW Contact  Carley Hammed, LCSW Phone Number: 06/24/2022, 1:47 PM  Clinical Narrative:    CSW confirmed with [facility] they can take pt, but they currently have lost power, so will need to take pt tomorrow. CSW updated medical team and family. Pt has been approved for [facility]AR transport [id number]. Authorization is good through tomorrow. TOC will continue to follow for DC needs.     Barriers to Discharge: Continued Medical Work up, SNF Pending bed offer  Expected Discharge Plan and Services In-house Referral: Clinical Social Work   Post Acute Care Choice: Skilled Nursing Facility Living arrangements for the past 2 months: Single Family Home                                       Social Determinants of Health (SDOH) Interventions SDOH Screenings   Food Insecurity: No Food Insecurity (06/13/2022)  Housing: Patient Declined (06/13/2022)  Transportation Needs: No Transportation Needs (06/13/2022)  Utilities: Not At Risk (06/13/2022)  Depression (PHQ2-9): High Risk (09/29/2020)  Tobacco Use: Medium Risk (06/15/2022)    Readmission Risk Interventions    06/27/2020    2:08 PM 12/05/2019    1:40 PM  Readmission Risk Prevention Plan  Transportation Screening Complete Complete  PCP or Specialist Appt within 3-5 Days  Complete  HRI or Home Care Consult Complete Complete  Social Work Consult for Recovery Care Planning/Counseling Complete Complete  Palliative Care Screening Not Applicable Not Applicable  Medication Review Oceanographer)  Complete

## 2022-06-24 NOTE — Progress Notes (Signed)
   Palliative Medicine Inpatient Follow Up Note HPI: This 77 yrs old Female with DM, hx. 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 Admitted for R hip fracture requiring IM nail placement.   The Palliative care team has been asked to get involved for further goals of care conversations.   Today's Discussion 06/24/2022  *Please note that this is a verbal dictation therefore any spelling or grammatical errors are due to the "Dragon [employer] One" system interpretation.  Chart reviewed inclusive of vital signs, progress notes, laboratory results, and diagnostic images.   I met with Anaiz and her daughter [name] at bedside this morning. She is resting comfortably in NAD. She shares that her nausea and abdominal bloating has improved. She expresses that she has been getting up daily. Meegan vocalized bowel movements which are frequent and loose - we discussed Gerhardt bottom bream for her sensitivity.   Arlisha shares the goals to continue PT/OT.   Waynesha asks when her foley can be removed. We discussed this being done then having prompted voiding every 4 hours while awake whereby she can be sat on the commode.   Diet has been advanced this morning by the surgical team.   Goals at this time are for improvement and eventual SNF placement once medically optimized.   Questions and concerns addressed/Palliative Support Provided.   Objective Assessment: Vital Signs Vitals:   06/24/22 0443 06/24/22 0807  BP: (!) 162/58 (!) 162/56  Pulse: 64 61  Resp: 17 18  Temp: 98.3 F (36.8 C) 98 F (36.7 C)  SpO2: 96% 95%    Intake/Output Summary (Last 24 hours) at 06/24/2022 1151 Last data filed at 06/23/2022 2259 Gross per 24 hour  Intake 3402.07 ml  Output 850 ml  Net 2552.07 ml    Last Weight  Most recent update: 06/24/2022  5:50 AM    Weight   72.5 kg (159 lb 13.3 oz)            Gen: Elderly Caucasian F in NAD HEENT: moist mucous membranes CV: Regular rate and rhythm  PULM:  On 2LPM [place], breathing nonlabored ABD: Distended w/hypoactive BS, tender to palpation EXT: No edema  Neuro: Alert and oriented x3   SUMMARY OF RECOMMENDATIONS   DNAR/DNI   MOST form on chart     Goals at this time are for improvements - allowing time for outcomes   TOC - OP Palliative support on discharge  Buttocks sensitivity - Gerhardt's bottom cream  Urinary rentention - Voiding trial will be completed today   Ongoing PMT support   Billing based on MDM: High ______________________________________________________________________________________ Lamarr Lulas  Palliative Medicine Team Team Cell Phone: [phone] Please utilize secure chat with additional questions, if there is no response within 30 minutes please call the above phone number  Palliative Medicine Team providers are available by phone from 7am to 7pm daily and can be reached through the team cell phone.  Should this patient require assistance outside of these hours, please call the patient's attending physician.

## 2022-06-24 NOTE — Progress Notes (Signed)
  Progress Note   Patient: Chelsea [name] ONG:[id number] DOB: December 12, 1945 DOA: 06/12/2022     12 DOS: the patient was seen and examined on 06/24/2022   Brief hospital course: Mrs. Buttery is a 77 y.o. 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  In the ER, radiograph showed RIGHT hip fracture   6/1: Admitted 6/2: Got peripheral nerve block 6/3: Got IM nail by Dr. Carola Frost, creatinine up to 2.8 6/4: Still orthostatic  Assessment and Plan: Hip fracture, right, closed, initial encounter (HCC) S/p INTRAMEDULLARY (IM) NAIL INTERTROCHANTERIC, REPAIR OF RIGHT HIP FRACTURE 6/3 by Dr. Carola Frost - PT/OT with recs for SNF on d/c - WBAT R leg with walker - Unrestricted ROM - Analgesia per ortho - DVT PPx SCDs and Plavix   Postop Ileus (HCC) -General Surgery following -Ileus is now resolved, advanced to soft diet   Leukocytosis: - No obvious sign of infection - Remains afebrile -procalcitonin neg -Leukocytosis normalized    Hyperkalemia Resolved   Wheezing -remains stable  -no auditory wheezing   PVD (peripheral vascular disease) (HCC) - Continue aspirin, Crestor, Plavix    Fall    AKI (acute kidney injury) on CKD IV Cr baseline 2-2.2, today up to 2.8 preop.  Trended down to 1.69 with IV fluids. - Holding  olmesartan, torsemide  - Avoid nephrotoxins   Pure hypercholesterolemia - Continue Crestor   History of CVA (cerebrovascular accident) - Continue aspirin, Plavix - Trend CBC - Continue Crestor   Malnutrition of moderate degree As evidenced by loss of subcutaneous muscle mass and fat diffusely   Essential hypertension BP trending up  -Cont Coreg, finerenone -holding ARB due to AKI - Holding torsemide per above   Controlled type 2 diabetes mellitus with hyperglycemia, without long-term current use of insulin  (HCC) A1c 8.1%.  Diet controlled at baseline. -Glycemic trends stable -Cont SSI as needed      Subjective: Tolerating diet  Physical Exam: Vitals:   06/24/22 0443 06/24/22 0500 06/24/22 0807 06/24/22 1440  BP: (!) 162/58  (!) 162/56 (!) 145/54  Pulse: 64  61 (!) 59  Resp: 17  18 16   Temp: 98.3 F (36.8 C)  98 F (36.7 C) 98 F (36.7 C)  TempSrc: Oral  Oral Oral  SpO2: 96%  95% 97%  Weight:  72.5 kg    Height:       General exam: Conversant, in no acute distress Respiratory system: normal chest rise, clear, no audible wheezing Cardiovascular system: regular rhythm, s1-s2 Gastrointestinal system: Nondistended, nontender, pos BS Central nervous system: No seizures, no tremors Extremities: No cyanosis, no joint deformities Skin: No rashes, no pallor Psychiatry: Affect normal // no auditory hallucinations   Data Reviewed:  Labs reviewed: na 137, K 3.5, Cr 1.60, WBC 9.7, Hgb 8.0  Family Communication: Pt in room, family at bedside  Disposition: Status is: Inpatient Remains inpatient appropriate because: Severity of illness  Planned Discharge Destination: Skilled nursing facility     Author: Rickey Barbara, MD 06/24/2022 5:25 PM  For on call review [email or web address].

## 2022-06-24 NOTE — Progress Notes (Signed)
10 Days Post-Op  Subjective: Patient having bowel function, tolerating FLD. Denies abdominal pain. Her daughter is at the bedside this AM.   Objective: Vital signs in last 24 hours: Temp:  [97.9 F (36.6 C)-98.4 F (36.9 C)] 98 F (36.7 C) (06/13 0807) Pulse Rate:  [61-64] 61 (06/13 0807) Resp:  [16-18] 18 (06/13 0807) BP: (140-162)/(42-58) 162/56 (06/13 0807) SpO2:  [95 %-97 %] 95 % (06/13 0807) Weight:  [72.5 kg] 72.5 kg (06/13 0500) Last BM Date : 06/24/22  Intake/Output from previous day: 06/12 0701 - 06/13 0700 In: 3522.1 [P.O.:360; I.V.:3162.1] Out: 850 [Urine:850] Intake/Output this shift: No intake/output data recorded.  PE: Gen:  Alert, NAD, pleasant Abd: Mild distension but very soft and completely NT. normoactive BS.   Lab Results:  Recent Labs    06/22/22 0619 06/24/22 0755  WBC 12.8* 9.7  HGB 8.2* 8.0*  HCT 26.6* 25.5*  PLT 419* 428*    BMET Recent Labs    06/22/22 0208  NA 141  K 4.3  CL 106  CO2 29  GLUCOSE 139*  BUN 34*  CREATININE 1.69*  CALCIUM 8.0*    PT/INR No results for input(s): "LABPROT", "INR" in the last 72 hours. CMP     Component Value Date/Time   NA 141 06/22/2022 0208   NA 138 07/21/2020 1519   K 4.3 06/22/2022 0208   CL 106 06/22/2022 0208   CO2 29 06/22/2022 0208   GLUCOSE 139 (H) 06/22/2022 0208   BUN 34 (H) 06/22/2022 0208   BUN 30 (H) 07/21/2020 1519   CREATININE 1.69 (H) 06/22/2022 0208   CALCIUM 8.0 (L) 06/22/2022 0208   PROT 4.8 (L) 06/22/2022 0208   ALBUMIN 2.5 (L) 06/22/2022 0208   AST 17 06/22/2022 0208   ALT 16 06/22/2022 0208   ALKPHOS 76 06/22/2022 0208   BILITOT 0.9 06/22/2022 0208   GFRNONAA 31 (L) 06/22/2022 0208   GFRAA  09/17/2008 1612    >60        The eGFR has been calculated using the MDRD equation. This calculation has not been validated in all clinical situations. eGFR's persistently <60 mL/min signify possible Chronic Kidney Disease.   Lipase     Component Value  Date/Time   LIPASE 21 12/12/2019 0811    Studies/Results: DG Abd Portable 1V  Result Date: 06/22/2022 CLINICAL DATA:  Ileus. EXAM: PORTABLE ABDOMEN - 1 VIEW COMPARISON:  Radiographs 06/21/2022, 06/20/2022 and 06/19/2022. CT 06/19/2022. FINDINGS: 1019 hours. Two views are submitted. Unchanged position of the enteric tube, projecting over the proximal stomach. Contrast material remains within decompressed colon. Previously demonstrated small bowel distension has improved. No extravasated enteric contrast or pneumoperitoneum. There is mild atelectasis and small effusions at both lung bases. IMPRESSION: Interval improvement in small bowel distension. No evidence of bowel obstruction. Enteric tube remains in place. Electronically Signed   By: Carey Bullocks M.D.   On: 06/22/2022 14:16    Anti-infectives: Anti-infectives (From admission, onward)    Start     Dose/Rate Route Frequency Ordered Stop   06/14/22 1200  ceFAZolin (ANCEF) IVPB 2g/100 mL premix       Note to Pharmacy: Dr. Carola Frost reviewed allergies, wants Ancef to be given   2 g 200 mL/hr over 30 Minutes Intravenous  Once 06/14/22 1147 06/14/22 1304        Assessment/Plan Ileus  - Hx Hysterectomy - CT 6/8 w/ distended small bowel loops w/o obvious transition point.  - Afebrile. No tachycardia or hypotension. WBC downtrending.  No peritonitis on exam. No indication for emergency surgery.  - Contrast in colon to descending colon/rectum on follow up film, repeat X-ray yesterday with non-obstructive pattern, less small bowel distention. - advance to soft diet. - at discharge patient would benefit from a daily bowel regimen. Recommend Senna-S BID, BID miralax, and daily fiber supplement with plenty of water (8-16 oz with the fiber). If bowel movements become loose/too frequent then can change miralax to PRN.  - Mobilize as able for bowel function. She did recently have R Hip IMN - WBAT per Ortho notes.  - Keep K > 4 and Mg > 2 for bowel  function - stable for DC from surgery perspective when medically appropriate, we will sign off   FEN - soft,  IVF per primary  VTE - SCDs, plavix ID - None currently.   I reviewed hospitalist notes, last 24 h vitals and pain scores, last 48 h intake and output, and last 24 h labs and trends.    LOS: 12 days    Juliet Rude , [facility] Surgery 06/24/2022, 8:38 AM Please see Amion for pager number during day hours 7:00am-4:30pm

## 2022-06-25 ENCOUNTER — Inpatient Hospital Stay (HOSPITAL_COMMUNITY): Payer: PPO

## 2022-06-25 DIAGNOSIS — S72001A Fracture of unspecified part of neck of right femur, initial encounter for closed fracture: Secondary | ICD-10-CM | POA: Diagnosis not present

## 2022-06-25 DIAGNOSIS — S72144A Nondisplaced intertrochanteric fracture of right femur, initial encounter for closed fracture: Secondary | ICD-10-CM | POA: Diagnosis not present

## 2022-06-25 DIAGNOSIS — K56609 Unspecified intestinal obstruction, unspecified as to partial versus complete obstruction: Secondary | ICD-10-CM | POA: Diagnosis not present

## 2022-06-25 LAB — COMPREHENSIVE METABOLIC PANEL
ALT: 13 U/L (ref 0–44)
AST: 16 U/L (ref 15–41)
Albumin: 2.4 g/dL — ABNORMAL LOW (ref 3.5–5.0)
Alkaline Phosphatase: 120 U/L (ref 38–126)
Anion gap: 7 (ref 5–15)
BUN: 25 mg/dL — ABNORMAL HIGH (ref 8–23)
CO2: 26 mmol/L (ref 22–32)
Calcium: 7.8 mg/dL — ABNORMAL LOW (ref 8.9–10.3)
Chloride: 106 mmol/L (ref 98–111)
Creatinine, Ser: 1.73 mg/dL — ABNORMAL HIGH (ref 0.44–1.00)
GFR, Estimated: 30 mL/min — ABNORMAL LOW (ref >60)
Glucose, Bld: 163 mg/dL — ABNORMAL HIGH (ref 70–99)
Potassium: 3.8 mmol/L (ref 3.5–5.1)
Sodium: 139 mmol/L (ref 135–145)
Total Bilirubin: 0.6 mg/dL (ref 0.3–1.2)
Total Protein: 4.7 g/dL — ABNORMAL LOW (ref 6.5–8.1)

## 2022-06-25 LAB — GLUCOSE, CAPILLARY
Glucose-Capillary: 152 mg/dL — ABNORMAL HIGH (ref 70–99)
Glucose-Capillary: 172 mg/dL — ABNORMAL HIGH (ref 70–99)
Glucose-Capillary: 174 mg/dL — ABNORMAL HIGH (ref 70–99)

## 2022-06-25 LAB — CBC
HCT: 25 % — ABNORMAL LOW (ref 36.0–46.0)
Hemoglobin: 7.8 g/dL — ABNORMAL LOW (ref 12.0–15.0)
MCH: 30.6 pg (ref 26.0–34.0)
MCHC: 31.2 g/dL (ref 30.0–36.0)
MCV: 98 fL (ref 80.0–100.0)
Platelets: 431 10*3/uL — ABNORMAL HIGH (ref 150–400)
RBC: 2.55 MIL/uL — ABNORMAL LOW (ref 3.87–5.11)
RDW: 12.7 % (ref 11.5–15.5)
WBC: 9.7 10*3/uL (ref 4.0–10.5)
nRBC: 0 % (ref 0.0–0.2)

## 2022-06-25 IMAGING — DX DG CHEST 1V PORT
1 series · 1 of 1 positions shown · non-contrast
Comparison: 11/20/2020

CLINICAL DATA: Unknown source of infection.

EXAM:
PORTABLE CHEST 1 VIEW

[chest ap]
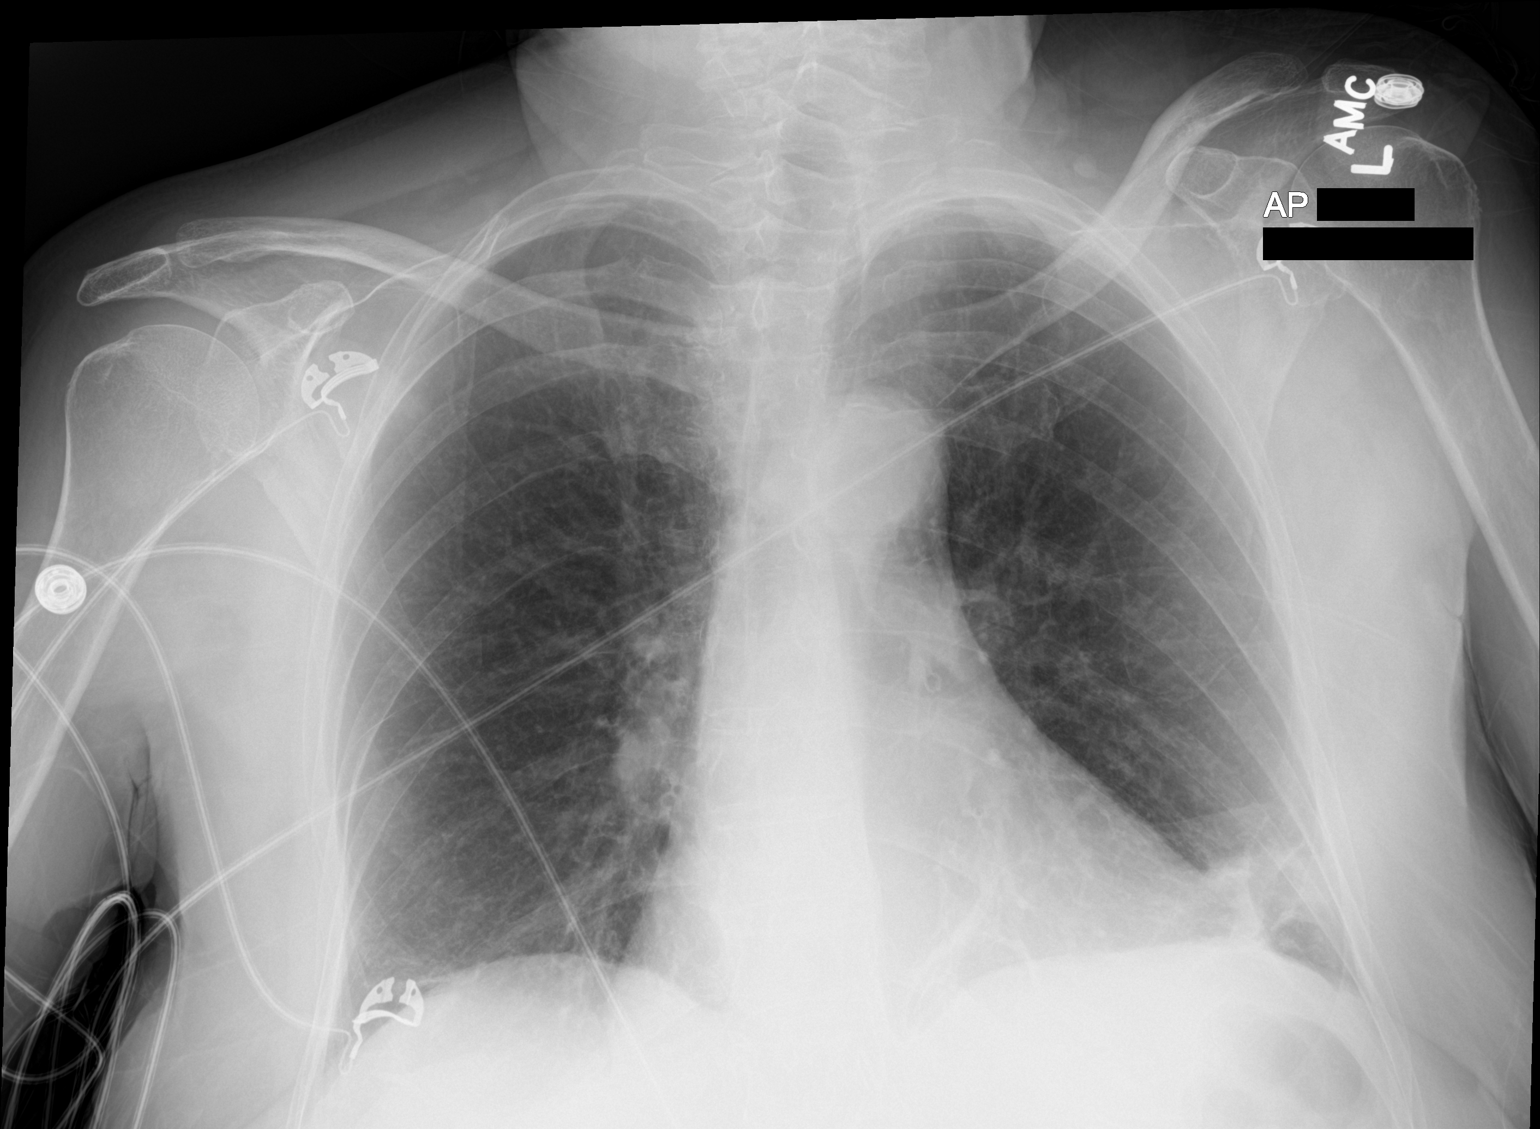

[1 of 1 positions shown; findings below may reference images not displayed]

FINDINGS: Heart size is normal. There is patchy opacity in the LATERAL LEFT
lung base, consistent with atelectasis or possible focal infiltrate.
No pulmonary edema.
IMPRESSION: LEFT LOWER lobe atelectasis or infiltrate.

## 2022-06-25 IMAGING — CT CT ABD-PELV W/O CM
2 of 4 series · 16 of 46 positions shown, 18 images · non-contrast
Comparison: Renal ultrasound 08/14/2020, CT abdomen/pelvis
09/17/2008

CLINICAL DATA: Fever, diagnosed with UTI

EXAM:
CT ABDOMEN AND PELVIS WITHOUT CONTRAST
TECHNIQUE: Multidetector CT imaging of the abdomen and pelvis was performed
following the standard protocol without IV contrast.

[Series 2: axial st · axial · 0.70mm/px · z∈[+1030,+1385]mm · 13 of 81 slices shown, 15 images]
[im 5/81  soft-tissue]
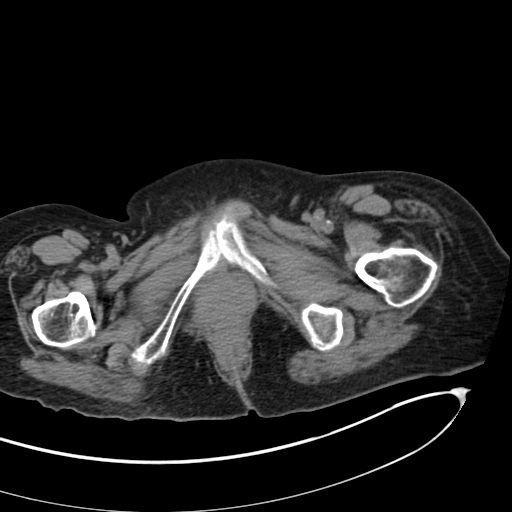
[im 5/81  bone]
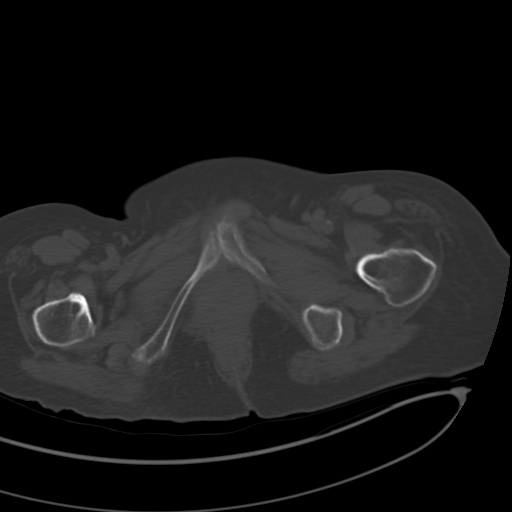
[im 13/81  soft-tissue]
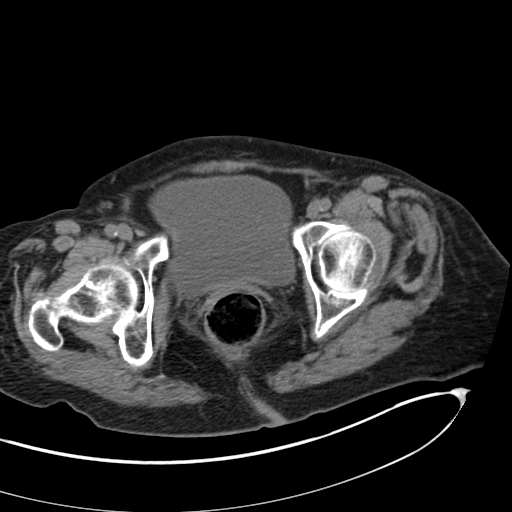
[im 17/81  soft-tissue]
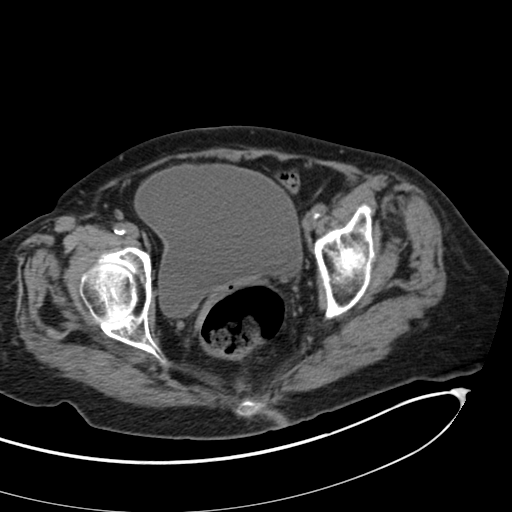
[im 22/81  soft-tissue]
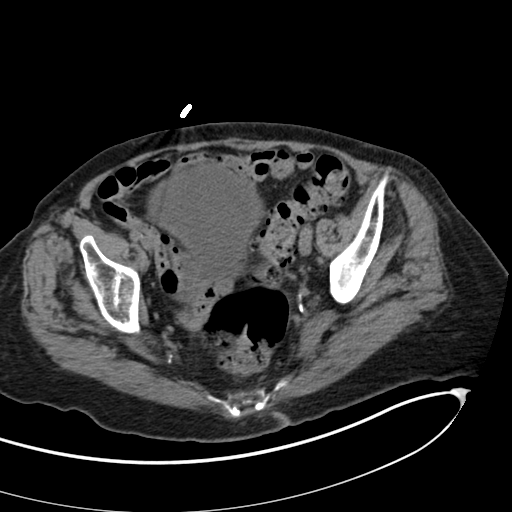
[im 30/81  soft-tissue]
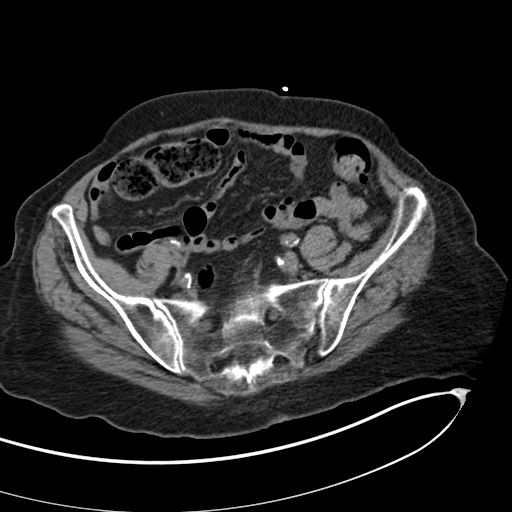
[im 34/81  soft-tissue]
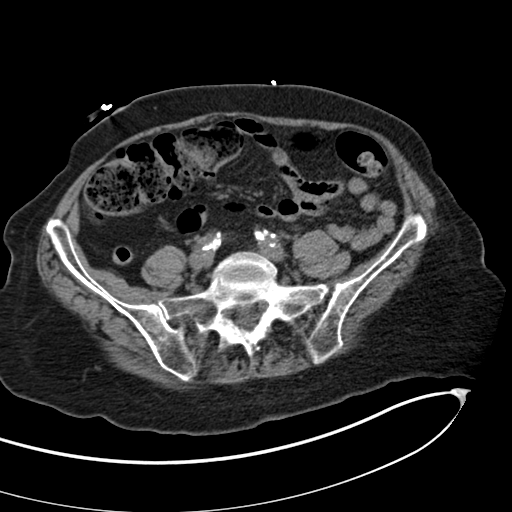
[im 43/81  soft-tissue]
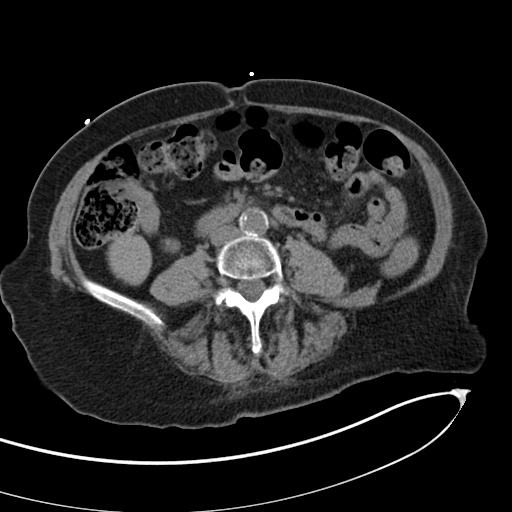
[im 47/81  soft-tissue]
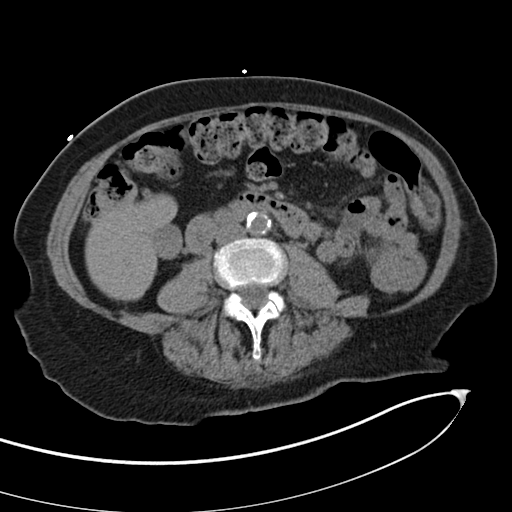
[im 51/81  soft-tissue]
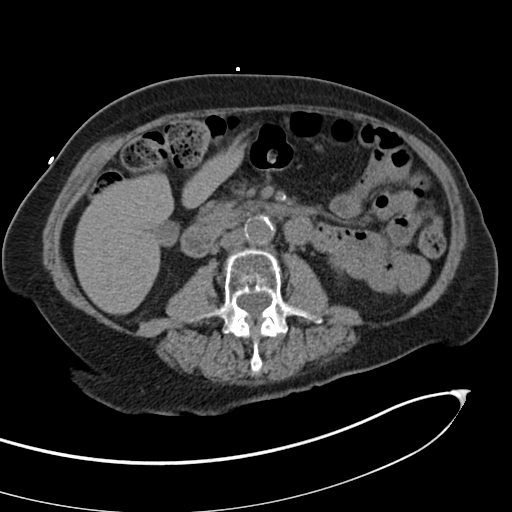
[im 51/81  bone]
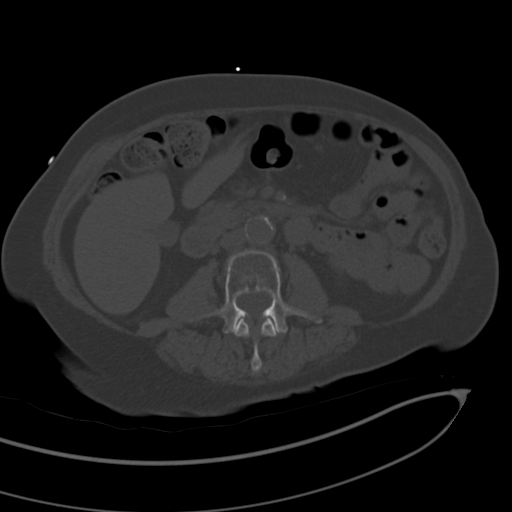
[im 59/81  soft-tissue]
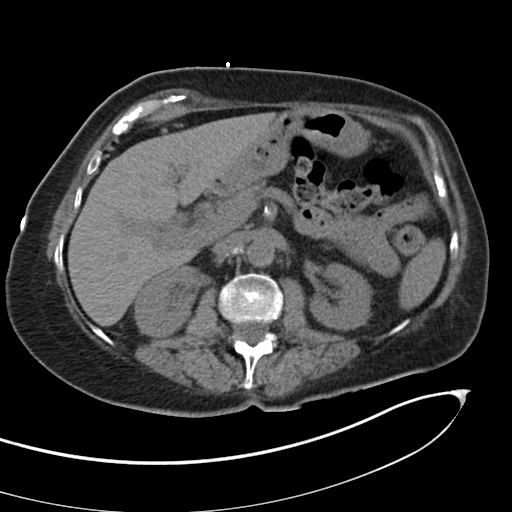
[im 64/81  soft-tissue]
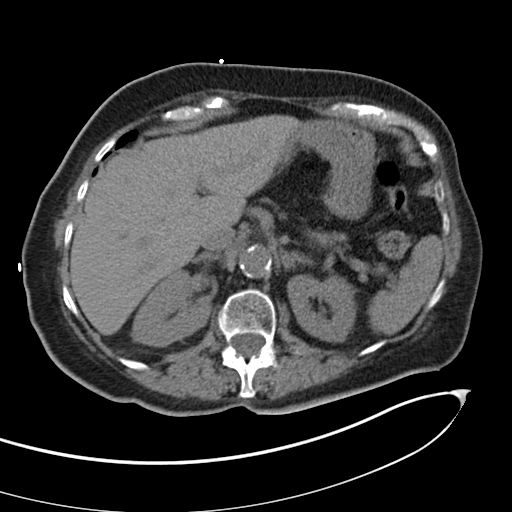
[im 68/81  soft-tissue]
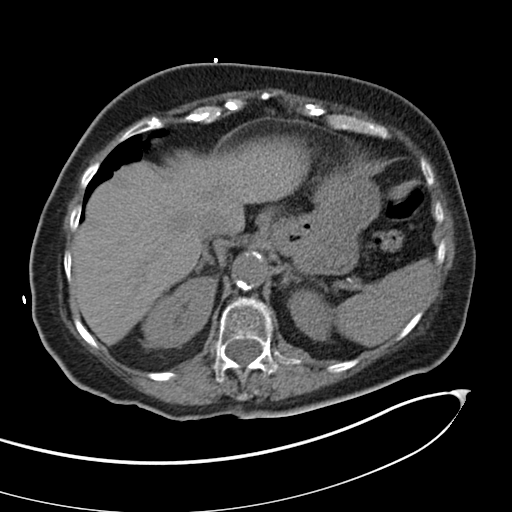
[im 76/81  soft-tissue]
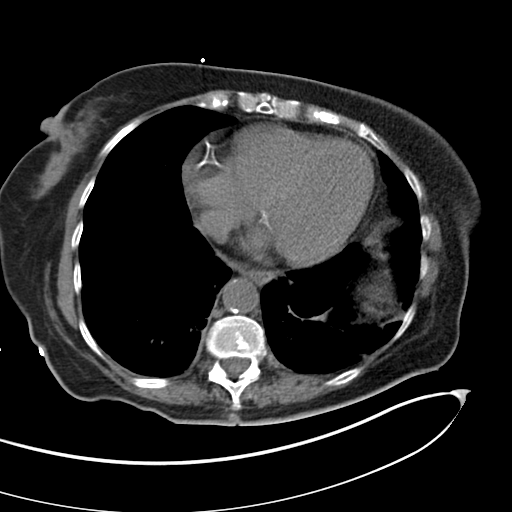

[Series 5: coronal st · coronal · 0.71mm/px · 3 of 149 slices shown]
[im 50/149  soft-tissue]
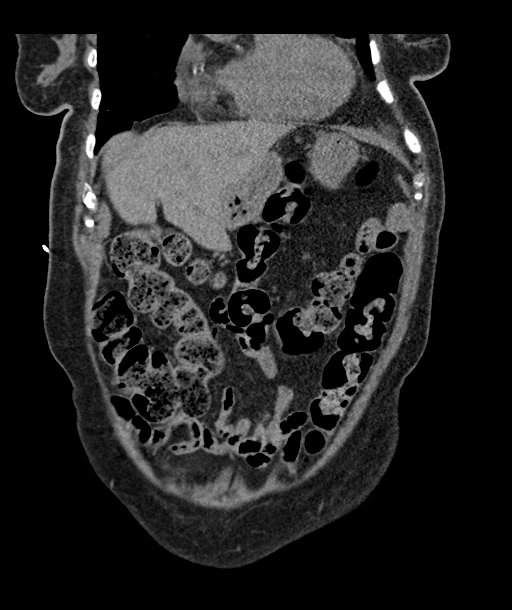
[im 66/149  soft-tissue]
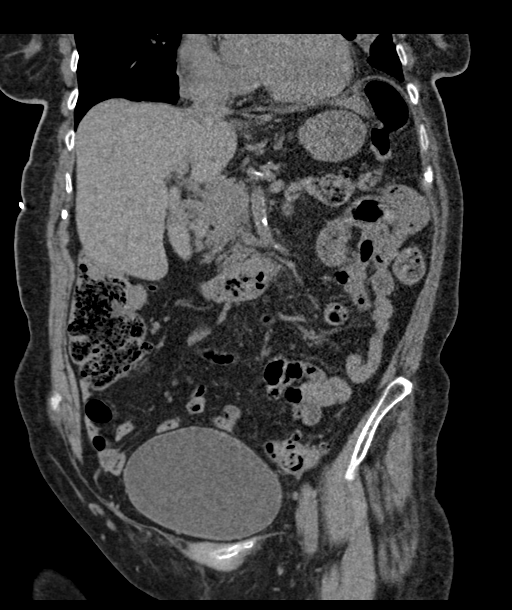
[im 83/149  soft-tissue]
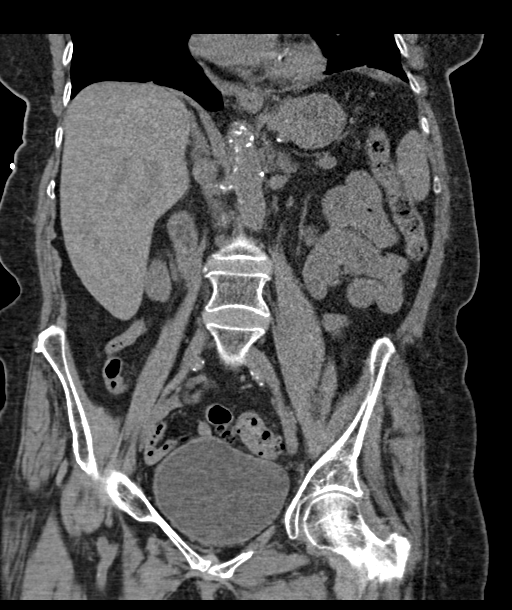

[16 of 46 positions shown; findings below may reference images not displayed]

FINDINGS: Lower chest: There is partially imaged consolidative opacity in the
lingula. Additional linear opacities in the lung bases may reflect
atelectasis or scar. Mitral valve annular and coronary artery
calcifications are noted.

Hepatobiliary: The liver and gallbladder are unremarkable. There is
no biliary ductal dilatation.

Pancreas: The pancreatic body and tail are markedly atrophic. There
are no focal lesions or contour abnormalities. There is no main
pancreatic ductal dilatation or peripancreatic inflammatory change.

Spleen: Unremarkable.

Adrenals/Urinary Tract: Adrenals are unremarkable.

The kidneys are unremarkable, with no focal lesion, stone,
hydronephrosis, or hydroureter. Bladder is unremarkable.

Stomach/Bowel: The stomach is decompressed but grossly unremarkable.
There is no evidence of bowel obstruction. There is colonic
diverticulosis without evidence of acute diverticulitis. The
appendix is normal. There is a mild stool burden throughout the
colon.

Vascular/Lymphatic: There is scattered calcified atherosclerotic
plaque throughout the nonaneurysmal abdominal aorta. There is no
abdominal or pelvic lymphadenopathy.

Reproductive: Uterus is surgically absent. There is no adnexal mass.

Other: There is no ascites or free air.

Musculoskeletal: There is no acute osseous abnormality or aggressive
osseous lesion.
IMPRESSION: 1. Partially imaged consolidation in the lingula. While this may
reflect scar which has developed since 0030, pneumonia is not
excluded.
2. No acute findings in the abdomen or pelvis.
3. Colonic diverticulosis without evidence of acute diverticulitis.
4.  Aortic Atherosclerosis ([id number]).

## 2022-06-25 MED ORDER — PSYLLIUM 95 % PO PACK: 1.0000 | PACK | Freq: Every day | ORAL | 0 refills | Status: DC

## 2022-06-25 MED ORDER — ASPIRIN 81 MG PO CHEW: 81.0000 mg | CHEWABLE_TABLET | Freq: Every day | ORAL | Status: DC

## 2022-06-25 MED ORDER — SENNOSIDES-DOCUSATE SODIUM 8.6-50 MG PO TABS: 1.0000 | ORAL_TABLET | Freq: Every evening | ORAL | Status: DC | PRN

## 2022-06-25 MED ORDER — STOOL SOFTENER 100 MG PO TABS: 100.0000 mg | ORAL_TABLET | Freq: Two times a day (BID) | ORAL | 0 refills | Status: DC

## 2022-06-25 MED ORDER — POLYETHYLENE GLYCOL 3350 17 G PO PACK: 17.0000 g | PACK | Freq: Every day | ORAL | 0 refills | Status: DC | PRN

## 2022-06-25 MED ORDER — IRBESARTAN 150 MG PO TABS
75.0000 mg | ORAL_TABLET | Freq: Every day | ORAL | Status: DC
  Administered 2022-06-25: 75 mg via ORAL
  Filled 2022-06-25: qty 1

## 2022-06-25 NOTE — Progress Notes (Signed)
PIV removed. Rx returned to patient's daughter. Sutures removed from R hip. [facility] here to transport patient.

## 2022-06-25 NOTE — Discharge Summary (Addendum)
Physician Discharge Summary   Patient: Chelsea [name] MRN: [id number] DOB: November 03, 1945  Admit date:     06/12/2022  Discharge date: 06/25/22  Discharge Physician: Rickey Barbara   PCP: Trisha Mangle, FNP   Recommendations at discharge:    Follow up with PCP in 1-2 weeks Follow up with Orthopedic Surgery as scheduled Recommend recheck CBC in 2-3 weeks, focus on hgb Please ensure pt has regular bowel movements. Recommend increasing or decreasing bowel regimen as needed  Discharge Diagnoses: Principal Problem:   Hip fracture, right, closed, initial encounter (HCC) Active Problems:   Ileus (HCC)   Controlled type 2 diabetes mellitus with hyperglycemia, without long-term current use of insulin (HCC)   Essential hypertension   CKD (chronic kidney disease), stage IV (HCC)   Malnutrition of moderate degree   History of CVA (cerebrovascular accident)   Pure hypercholesterolemia   AKI (acute kidney injury) on CKD IV   Fall   PVD (peripheral vascular disease) (HCC)   Wheezing   Hyperkalemia  Resolved Problems:   * No resolved hospital problems. [facility] Course: Chelsea [name] is a 77 y.o. 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  In the ER, radiograph showed RIGHT hip fracture   6/1: Admitted 6/2: Got peripheral nerve block 6/3: Got IM nail by Dr. Carola Frost, creatinine up to 2.8 6/4: Still orthostatic  Assessment and Plan: Hip fracture, right, closed, initial encounter (HCC) S/p INTRAMEDULLARY (IM) NAIL INTERTROCHANTERIC, REPAIR OF RIGHT HIP FRACTURE 6/3 by Dr. Carola Frost - PT/OT with recs for SNF on d/c - WBAT R leg with walker - Unrestricted ROM - DVT PPx SCDs while in hospital with Plavix continued. Discharged with plavix continued   Postop Ileus (HCC) -General Surgery following -Ileus is now resolved,  advanced to soft diet   Leukocytosis: - No obvious sign of infection - Remains afebrile -procalcitonin neg -Leukocytosis normalized    Hyperkalemia Resolved   Wheezing -remains stable  -no auditory wheezing   PVD (peripheral vascular disease) (HCC) - Continue aspirin, Crestor, Plavix    Fall    AKI (acute kidney injury) on CKD IV Cr baseline 2-2.2, today up to 2.8 preop.  Trended down to 1.69 with IV fluids. - held  olmesartan, torsemide  - resume home meds on d/c   Pure hypercholesterolemia - Continue Crestor   History of CVA (cerebrovascular accident) - Continue aspirin, Plavix - Hgb remained stable - Continue Crestor   Malnutrition of moderate degree As evidenced by loss of subcutaneous muscle mass and fat diffusely   Essential hypertension BP trending up  -Cont Coreg, finerenone -held ARB due to AKI - resume home meds on d/c   Controlled type 2 diabetes mellitus with hyperglycemia, without long-term current use of insulin (HCC) A1c 8.1%.  Diet controlled at baseline. -Glycemic trends stable  Chronic normocytic anemia -Hgb previously in the 9-11 range, down to just above 8 at time of d/c -Pt on iron PTA, would continue -Recommend recheck CBC in 2-3 weeks       Consultants: Orthopedic Surgery Procedures performed: INTRAMEDULLARY (IM) NAIL INTERTROCHANTERIC, REPAIR OF RIGHT HIP FRACTURE 6/3 by Dr. Carola Frost  Disposition: Skilled nursing facility Diet recommendation:  Carb modified diet DISCHARGE MEDICATION: Allergies as of 06/25/2022       Reactions   Codeine Anaphylaxis, Swelling   Throat swells  Latex Anaphylaxis   Penicillins Anaphylaxis, Swelling   Throat closes  Tolerated Cephalosporin Date: 06/14/22.   Sulfa Antibiotics Anaphylaxis, Other (See Comments), Swelling   Lips and eye swell   Benzodiazepines Hives   Cephalosporins Other (See Comments)   Tolerated Cephalosporin Date: 06/14/22.   Lidocaine Hives   Oxycodone-acetaminophen  Palpitations   Other Reaction(s): Other (See Comments) unknown   Trazodone Other (See Comments)   Sweating Other Reaction(s): Other (See Comments) unknown   Atorvastatin Nausea And Vomiting   Vomiting Other Reaction(s): GI Intolerance Vomiting    Cephalexin Itching   Tolerating rocephin 06/25/20 Tolerated Cephalosporin Date: 06/14/22. Other Reaction(s): Other (See Comments) unknown   Gabapentin Nausea And Vomiting   Other Reaction(s): GI Intolerance   Pregabalin Nausea And Vomiting   Vomiting Other Reaction(s): GI Intolerance Vomiting         Medication List     TAKE these medications    acetaminophen 325 MG tablet Commonly known as: TYLENOL Take 1-2 tablets (325-650 mg total) by mouth every 6 (six) hours as needed for mild pain (pain score 1-3 or temp > 100.5). What changed:  medication strength how much to take reasons to take this   aspirin 81 MG chewable tablet Chew 1 tablet (81 mg total) by mouth daily. Start taking on: June 26, 2022   carvedilol 3.125 MG tablet Commonly known as: COREG TAKE ONE TABLET TWICE DAILY WITH MEAL(S) What changed: See the new instructions.   clopidogrel 75 MG tablet Commonly known as: PLAVIX TAKE 1 TABLET ONCE DAILY   feeding supplement (GLUCERNA SHAKE) Liqd Take 237 mLs by mouth 3 (three) times daily between meals.   guaiFENesin 600 MG 12 hr tablet Commonly known as: MUCINEX Take 1 tablet (600 mg total) by mouth 2 (two) times daily. What changed:  when to take this reasons to take this   HYDROcodone-acetaminophen 5-325 MG tablet Commonly known as: NORCO/VICODIN Take 1 tablet by mouth every 6 (six) hours as needed for moderate pain or severe pain (pain score 4-6).   Iron 325 (65 Fe) MG Tabs Take 325 mg by mouth every other day.   Kerendia 10 MG Tabs Generic drug: Finerenone Take 1 tablet by mouth daily.   multivitamin with minerals Tabs tablet Take 1 tablet by mouth daily.   olmesartan 20 MG tablet Commonly  known as: BENICAR Take 10 mg by mouth daily.   polyethylene glycol 17 g packet Commonly known as: MIRALAX / GLYCOLAX Take 17 g by mouth daily as needed for moderate constipation.   psyllium 95 % Pack Commonly known as: HYDROCIL/METAMUCIL Take 1 packet by mouth daily. Start taking on: June 26, 2022   rosuvastatin 5 MG tablet Commonly known as: CRESTOR Take 1 tablet (5 mg total) by mouth daily. What changed: See the new instructions.   senna-docusate 8.6-50 MG tablet Commonly known as: Senokot-S Take 1 tablet by mouth at bedtime as needed for mild constipation.   Stool Softener 100 MG capsule Generic drug: Docusate Sodium Take 1 tablet (100 mg total) by mouth 2 (two) times daily.   SYSTANE FREE OP Place 1 drop into both eyes daily.   tamsulosin 0.4 MG Caps capsule Commonly known as: FLOMAX Take 0.4 mg by mouth every evening.   torsemide 20 MG tablet Commonly known as: DEMADEX Take 20 mg by mouth daily. Takes 40 mg if swelling   vitamin D3 25 MCG tablet Commonly known as: CHOLECALCIFEROL Take 2 tablets (2,000 Units total) by mouth daily.  Contact information for follow-up providers     Myrene Galas, MD Follow up.   Specialty: Orthopedic Surgery Why: Hospital follow up, as will be scheduled Contact information: [address] [place] [address] [phone]              Contact information for after-discharge care     Destination     [facility] Preferred SNF .   Service: Skilled Nursing Contact information: [address] [place] [address] [phone]                    Discharge Exam: Ceasar Mons Weights   06/12/22 1340 06/14/22 0934 06/24/22 0500  Weight: 69.4 kg 69.4 kg 72.5 kg   General exam: Awake, laying in bed, in nad Respiratory system: Normal respiratory effort, no wheezing Cardiovascular system: regular rate, s1, s2 Gastrointestinal system: Soft,  nondistended, positive BS Central nervous system: CN2-12 grossly intact, strength intact Extremities: Perfused, no clubbing Skin: Normal skin turgor, no notable skin lesions seen Psychiatry: Mood normal // no visual hallucinations   Condition at discharge: fair  The results of significant diagnostics from this hospitalization (including imaging, microbiology, ancillary and laboratory) are listed below for reference.   Imaging Studies: DG HIP UNILAT WITH PELVIS 2-3 VIEWS RIGHT  Result Date: 06/25/2022 CLINICAL DATA:  Hip pain.  Recent ORIF for right hip fracture. EXAM: DG HIP (WITH OR WITHOUT PELVIS) 2-3V RIGHT COMPARISON:  Right hip radiographs 06/13/2021 and 06/11/2021 FINDINGS: Redemonstration of cephalomedullary nail fixation of the previously seen proximally femoral intertrochanteric fracture. Normal alignment. Persistent fracture line lucency. Mild right femoroacetabular joint space narrowing. Moderate to severe pubic symphysis joint space narrowing and subchondral sclerosis. No evidence of hardware failure. IMPRESSION: Redemonstration of cephalomedullary nail fixation of the previously seen proximally femoral intertrochanteric fracture. No evidence of hardware failure. Electronically Signed   By: Neita Garnet M.D.   On: 06/25/2022 09:07   DG Abd Portable 1V  Result Date: 06/22/2022 CLINICAL DATA:  Ileus. EXAM: PORTABLE ABDOMEN - 1 VIEW COMPARISON:  Radiographs 06/21/2022, 06/20/2022 and 06/19/2022. CT 06/19/2022. FINDINGS: 1019 hours. Two views are submitted. Unchanged position of the enteric tube, projecting over the proximal stomach. Contrast material remains within decompressed colon. Previously demonstrated small bowel distension has improved. No extravasated enteric contrast or pneumoperitoneum. There is mild atelectasis and small effusions at both lung bases. IMPRESSION: Interval improvement in small bowel distension. No evidence of bowel obstruction. Enteric tube remains in place.  Electronically Signed   By: Carey Bullocks M.D.   On: 06/22/2022 14:16   DG Abd Portable 1V  Result Date: 06/21/2022 CLINICAL DATA:  Ileus. EXAM: PORTABLE ABDOMEN - 1 VIEW COMPARISON:  Abdominal x-ray from yesterday. FINDINGS: Unchanged enteric tube in the stomach. Mildly dilated loops of air-filled small bowel in the central abdomen are unchanged. Oral contrast continues to advanced through the colon. IMPRESSION: 1. Unchanged partial small bowel obstruction. Electronically Signed   By: Obie Dredge M.D.   On: 06/21/2022 10:01   DG Abd Portable 1V-Small Bowel Obstruction Protocol-initial, 8 hr delay  Result Date: 06/20/2022 CLINICAL DATA:  Follow up small bowel obstruction EXAM: PORTABLE ABDOMEN - 1 VIEW COMPARISON:  06/19/2022 FINDINGS: Scattered large and small bowel gas is noted. Previously administered contrast now lies predominately within the colon. Persistent small bowel dilatation is seen. No free air is noted. IMPRESSION: Changes consistent with partial small bowel obstruction. Electronically Signed   By: Alcide Clever M.D.   On: 06/20/2022 10:01  DG Abd Portable 1V-Small Bowel Protocol-Position Verification  Result Date: 06/19/2022 CLINICAL DATA:  Check gastric catheter placement EXAM: PORTABLE ABDOMEN - 1 VIEW COMPARISON:  None Available. FINDINGS: Gastric catheter is noted within the stomach. Proximal side port lies in the distal esophagus. This should be advanced deeper into the stomach. Mild small bowel dilatation is seen. IMPRESSION: Gastric catheter as described. This should be advanced deeper into the stomach. Electronically Signed   By: Alcide Clever M.D.   On: 06/19/2022 16:10   CT ABDOMEN PELVIS WO CONTRAST  Result Date: 06/19/2022 CLINICAL DATA:  Bowel obstruction suspected. EXAM: CT ABDOMEN WITHOUT CONTRAST TECHNIQUE: Multidetector CT imaging of the abdomen was performed following the standard protocol without IV contrast. RADIATION DOSE REDUCTION: This exam was performed  according to the departmental dose-optimization program which includes automated exposure control, adjustment of the mA and/or kV according to patient size and/or use of iterative reconstruction technique. COMPARISON:  01/09/2021 FINDINGS: Lower chest: Dependent atelectasis noted in both lower lobes with tiny right pleural effusion. Hepatobiliary: No suspicious focal abnormality within the liver parenchyma. There is no evidence for gallstones, gallbladder wall thickening, or pericholecystic fluid. No intrahepatic or extrahepatic biliary dilation. Pancreas: No focal mass lesion. No dilatation of the main duct. No intraparenchymal cyst. No peripancreatic edema. Spleen: No splenomegaly. No focal mass lesion. Adrenals/Urinary Tract: No adrenal nodule or mass. Kidneys unremarkable. No evidence for hydroureter. The urinary bladder appears normal for the degree of distention. Stomach/Bowel: Stomach is distended with gas and contrast material. Duodenum is distended. Small bowel loops in the left upper quadrant are fluid-filled and distended up to 3.6 cm diameter. There is some edema in the mesentery of left upper quadrant jejunal loops (see axial 55/3). Dilated small bowel loops extend into the pelvis and while no discrete or abrupt transition zone is evident, distal ileal loops in the right lower quadrant are decompressed. The terminal ileum is decompressed. The appendix is normal. No gross colonic mass. No colonic wall thickening. Diverticuli are seen scattered along the entire length of the colon without CT findings of diverticulitis. Vascular/Lymphatic: There is moderate atherosclerotic calcification of the abdominal aorta without aneurysm. There is no gastrohepatic or hepatoduodenal ligament lymphadenopathy. No retroperitoneal or mesenteric lymphadenopathy. No pelvic sidewall lymphadenopathy. Other: Trace free fluid is seen adjacent to the liver. No free fluid in the pelvis. Musculoskeletal: Fixation hardware noted  right femoral neck. Bones are diffusely demineralized IMPRESSION: 1. Dilated stomach and duodenum with dilated proximal small bowel and some edema in the mesentery of left upper quadrant jejunal loops. While no discrete or abrupt transition zone is evident, distal ileal loops in the right lower quadrant are decompressed. Imaging features are suspicious for small bowel obstruction. No small bowel wall thickening or evidence of pneumatosis. 2. Trace free fluid adjacent to the liver. 3. Tiny right pleural effusion with dependent atelectasis in both lower lobes. 4.  Aortic Atherosclerosis (ICD10-I70.0). Electronically Signed   By: Kennith Center M.D.   On: 06/19/2022 14:22   CT HEAD WO CONTRAST ( )  Result Date: 06/17/2022 CLINICAL DATA:  Altered mental status EXAM: CT HEAD WITHOUT CONTRAST TECHNIQUE: Contiguous axial images were obtained from the base of the skull through the vertex without intravenous contrast. RADIATION DOSE REDUCTION: This exam was performed according to the departmental dose-optimization program which includes automated exposure control, adjustment of the mA and/or kV according to patient size and/or use of iterative reconstruction technique. COMPARISON:  05/01/2021 FINDINGS: Brain: There is no mass, hemorrhage or extra-axial collection. There is  generalized atrophy without lobar predilection. Hypodensity of the white matter is most commonly associated with chronic microvascular disease. Old posterior left parietal infarct and multiple old basal ganglia small vessel infarcts. Vascular: No abnormal hyperdensity of the major intracranial arteries or dural venous sinuses. No intracranial atherosclerosis. Skull: The visualized skull base, calvarium and extracranial soft tissues are normal. Sinuses/Orbits: No fluid levels or advanced mucosal thickening of the visualized paranasal sinuses. No mastoid or middle ear effusion. The orbits are normal. IMPRESSION: 1. No acute intracranial abnormality. 2.  Old posterior left parietal infarct and multiple old basal ganglia small vessel infarcts. 3. Hypodensity of the white matter is most commonly associated with chronic microvascular disease. Electronically Signed   By: Deatra Robinson M.D.   On: 06/17/2022 03:41   DG HIP UNILAT WITH PELVIS 2-3 VIEWS RIGHT  Result Date: 06/14/2022 CLINICAL DATA:  Postop EXAM: DG HIP (WITH OR WITHOUT PELVIS) 3V RIGHT COMPARISON:  Preop x-ray 06/12/2018 FINDINGS: Interval placement of dynamic right hip screw transfixing the intertrochanteric fracture. Expected alignment. Lateral soft tissue gas. Otherwise global osteopenia. Mild joint space loss of the hip joints and sacroiliac joints. Vascular calcifications. Whole pelvis x-rays rotated to the right slightly. Imaging was obtained to aid in treatment. IMPRESSION: Acute surgical changes of dynamic right hip screw placement transfixing the intertrochanteric fracture. Electronically Signed   By: Karen Kays M.D.   On: 06/14/2022 15:40   DG FEMUR, MIN 2 VIEWS RIGHT  Result Date: 06/14/2022 CLINICAL DATA:  ORIF RIGHT hip fracture. EXAM: RIGHT FEMUR 2 VIEWS COMPARISON:  None Available. FINDINGS: Intraoperative spot views of the RIGHT femur are submitted postoperatively for interpretation. Rod/screw fixation is noted traversing a intertrochanteric RIGHT femur fracture which appears in near anatomic alignment and position. IMPRESSION: ORIF RIGHT femur fracture, appears in near anatomic alignment and position on these images. Electronically Signed   By: Harmon Pier M.D.   On: 06/14/2022 14:07   DG C-Arm 1-60 Min-No Report  Result Date: 06/14/2022 Fluoroscopy was utilized by the requesting physician.  No radiographic interpretation.   DG C-Arm 1-60 Min-No Report  Result Date: 06/14/2022 Fluoroscopy was utilized by the requesting physician.  No radiographic interpretation.   ECHOCARDIOGRAM COMPLETE  Result Date: 06/13/2022    ECHOCARDIOGRAM REPORT   Patient Name:   YAMILETT JANOWIAK Ginyard Date  of Exam: 06/13/2022 Medical Rec #:  [id number]      Height:       67.0 in Accession #:    [id number]     Weight:       153.0 lb Date of Birth:  January 17, 1945      BSA:          1.805 m Patient Age:    19 years       BP:           124/74 mmHg Patient Gender: F              HR:           58 bpm. Exam Location:  Inpatient Procedure: 2D Echo, Cardiac Doppler and Color Doppler Indications:    CHF-Acute Diastolic I50.31  History:        Patient has prior history of Echocardiogram examinations, most                 recent 10/07/2020. Hip Fracture; Risk Factors:Diabetes,                 Hypertension and Dyslipidemia.  Sonographer:    Aron Baba Referring Phys:  [id number] PING T ZHANG IMPRESSIONS  1. Left ventricular ejection fraction, by estimation, is 70 to 75%. The left ventricle has hyperdynamic function. The left ventricle has no regional wall motion abnormalities. There is mild left ventricular hypertrophy. Left ventricular diastolic parameters are consistent with Grade II diastolic dysfunction (pseudonormalization). Elevated left atrial pressure.  2. Right ventricular systolic function was not well visualized. The right ventricular size is not well visualized.  3. Right atrial size was mildly dilated.  4. The mitral valve is degenerative. Trivial mitral valve regurgitation. Moderate mitral annular calcification.  5. The aortic valve is tricuspid. Aortic valve regurgitation is not visualized. Aortic valve sclerosis/calcification is present, without any evidence of aortic stenosis. FINDINGS  Left Ventricle: Left ventricular ejection fraction, by estimation, is 70 to 75%. The left ventricle has hyperdynamic function. The left ventricle has no regional wall motion abnormalities. The left ventricular internal cavity size was small. There is mild left ventricular hypertrophy. Left ventricular diastolic parameters are consistent with Grade II diastolic dysfunction (pseudonormalization). Elevated left atrial pressure. Right  Ventricle: The right ventricular size is not well visualized. Right vetricular wall thickness was not well visualized. Right ventricular systolic function was not well visualized. Left Atrium: Left atrial size was normal in size. Right Atrium: Right atrial size was mildly dilated. Pericardium: There is no evidence of pericardial effusion. Mitral Valve: The mitral valve is degenerative in appearance. Moderate mitral annular calcification. Trivial mitral valve regurgitation. MV peak gradient, 6.4 mmHg. The mean mitral valve gradient is 3.0 mmHg. Tricuspid Valve: The tricuspid valve is normal in structure. Tricuspid valve regurgitation is trivial. Aortic Valve: The aortic valve is tricuspid. Aortic valve regurgitation is not visualized. Aortic valve sclerosis/calcification is present, without any evidence of aortic stenosis. Pulmonic Valve: The pulmonic valve was not well visualized. Pulmonic valve regurgitation is trivial. Aorta: The aortic root is normal in size and structure. IAS/Shunts: The interatrial septum was not well visualized.  LEFT VENTRICLE PLAX 2D LVIDd:         3.40 cm   Diastology LVIDs:         2.10 cm   LV e' medial:    4.24 cm/s LV PW:         1.10 cm   LV E/e' medial:  25.0 LV IVS:        0.70 cm   LV e' lateral:   6.96 cm/s LVOT diam:     1.60 cm   LV E/e' lateral: 15.2 LV SV:         53 LV SV Index:   29 LVOT Area:     2.01 cm  RIGHT VENTRICLE RV S prime:     9.14 cm/s TAPSE (M-mode): 1.8 cm LEFT ATRIUM             Index        RIGHT ATRIUM           Index LA diam:        3.70 cm 2.05 cm/m   RA Area:     22.40 cm LA Vol (A2C):   49.0 ml 27.15 ml/m  RA Volume:   72.10 ml  39.95 ml/m LA Vol (A4C):   36.6 ml 20.28 ml/m LA Biplane Vol: 45.8 ml 25.38 ml/m  AORTIC VALVE             PULMONIC VALVE LVOT Vmax:   112.00 cm/s PR End Diast Vel: 5.71 msec LVOT Vmean:  76.500 cm/s LVOT VTI:    0.262 m  AORTA  Ao Root diam: 3.50 cm Ao Asc diam:  3.70 cm MITRAL VALVE MV Area (PHT): 2.07 cm     SHUNTS MV  Area VTI:   1.05 cm     Systemic VTI:  0.26 m MV Peak grad:  6.4 mmHg     Systemic Diam: 1.60 cm MV Mean grad:  3.0 mmHg MV Vmax:       1.26 m/s MV Vmean:      76.6 cm/s MV Decel Time: 366 msec MR Peak grad: 24.6 mmHg MR Vmax:      248.00 cm/s MV E velocity: 106.00 cm/s MV A velocity: 135.00 cm/s MV E/A ratio:  0.79 Epifanio Lesches MD Electronically signed by Epifanio Lesches MD Signature Date/Time: 06/13/2022/3:53:09 PM    Final    DG Knee Right Port  Result Date: 06/13/2022 CLINICAL DATA:  Hip fracture, right, closed, initial encounter. EXAM: PORTABLE RIGHT KNEE - 1-2 VIEW COMPARISON:  None Available. FINDINGS: The bones are subjectively under mineralized. No acute fracture. The joint spaces are preserved. Minor peripheral spurring. Minimal knee joint effusion. No erosive change or focal bone abnormality. IMPRESSION: Minor osteoarthritis.  No acute fracture of the right knee. Electronically Signed   By: Narda Rutherford M.D.   On: 06/13/2022 15:29   DG Chest 1 View  Result Date: 06/12/2022 CLINICAL DATA:  COPD. Fall. Right hip fracture. EXAM: CHEST  1 VIEW COMPARISON:  Chest radiograph 02/05/2021 FINDINGS: Chronic cardiomegaly. Unchanged mediastinal contours. Diaphragmatic flattening was better demonstrated on prior exam not included a lateral view. Bandlike atelectasis or scarring at the left lung base. No acute airspace disease, pleural effusion or pneumothorax. On limited assessment, no acute osseous finding. IMPRESSION: Chronic cardiomegaly. Bandlike atelectasis or scarring at the left lung base. Electronically Signed   By: Narda Rutherford M.D.   On: 06/12/2022 19:14   DG Hip Port Unilat W or Wo Pelvis 1 View Right  Result Date: 06/12/2022 CLINICAL DATA:  Trauma EXAM: DG HIP (WITH OR WITHOUT PELVIS) 1V PORT RIGHT COMPARISON:  10/06/2020 FINDINGS: Acute intertrochanteric fracture is seen in proximal right femur. There is no dislocation. There is slight overriding of fracture fragments.  IMPRESSION: Recent intertrochanteric fracture is seen in proximal right femur. Electronically Signed   By: Ernie Avena M.D.   On: 06/12/2022 15:06    Microbiology: Results for orders placed or performed during the hospital encounter of 06/12/22  Surgical PCR screen     Status: Abnormal   Collection Time: 06/14/22 12:45 AM   Specimen: Nasal Mucosa; Nasal Swab  Result Value Ref Range Status   MRSA, PCR NEGATIVE NEGATIVE Final   Staphylococcus aureus POSITIVE (A) NEGATIVE Final    Comment: (NOTE) The Xpert SA Assay (FDA approved for NASAL specimens in patients 81 years of age and older), is one component of a comprehensive surveillance program. It is not intended to diagnose infection nor to guide or monitor treatment. Performed at [facility], [address], [place], [place] [address]     Labs: CBC: Recent Labs  Lab 06/20/22 0933 06/21/22 0246 06/22/22 0619 06/24/22 0755 06/25/22 0147  WBC 10.6* 12.2* 12.8* 9.7 9.7  HGB 8.6* 7.7* 8.2* 8.0* 7.8*  HCT 26.7* 24.0* 26.6* 25.5* 25.0*  MCV 98.5 96.4 99.6 100.8* 98.0  PLT 336 176 419* 428* 431*   Basic Metabolic Panel: Recent Labs  Lab 06/19/22 0121 06/20/22 0933 06/21/22 0246 06/22/22 0208 06/24/22 0755 06/25/22 0147  NA 133* 138 139 141 137 139  K 5.4* 4.9 4.5 4.3 3.5  3.8  CL 99 104 102 106 104 106  CO2 24 26 29 29 26 26   GLUCOSE 208* 145* 131* 139* 195* 163*  BUN 44* 40* 41* 34* 27* 25*  CREATININE 1.84* 1.80* 1.75* 1.69* 1.60* 1.73*  CALCIUM 8.4* 8.3* 7.9* 8.0* 7.8* 7.8*  MG 2.8* 2.6* 2.8*  --   --   --   PHOS 3.3  --  3.1  --   --   --    Liver Function Tests: Recent Labs  Lab 06/20/22 0933 06/22/22 0208 06/24/22 0755 06/25/22 0147  AST 27 17 14* 16  ALT 18 16 12 13   ALKPHOS 76 76 94 120  BILITOT 1.1 0.9 0.6 0.6  PROT 5.7* 4.8* 4.8* 4.7*  ALBUMIN 2.7* 2.5* 2.4* 2.4*   CBG: Recent Labs  Lab 06/24/22 1607 06/24/22 2047 06/25/22 0014 06/25/22 0535 06/25/22 0759  GLUCAP 122* 113*  152* 172* 174*    Discharge time spent: less than 30 minutes.  Signed: Rickey Barbara, MD [facility] 06/25/2022

## 2022-06-25 NOTE — Progress Notes (Signed)
   Palliative Medicine Inpatient Follow Up Note HPI: This 77 yrs old Female with DM, hx. 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 Admitted for R hip fracture requiring IM nail placement.   The Palliative care team has been asked to get involved for further goals of care conversations.   Today's Discussion 06/25/2022  *Please note that this is a verbal dictation therefore any spelling or grammatical errors are due to the "Dragon Medical One" system interpretation.  Chart reviewed inclusive of vital signs, progress notes, laboratory results, and diagnostic images.   I met with Chelsea [name] this morning in the presence of her daughter, Chelsea [name]. She shares that she continues to have consistent bowel movements. She is  feeling overall improved.   She has been able to urinate since her catheters removal.   Chelsea [name] asks about what her rehab journey will look like and I was able to provide insights on this.   Chelsea [name] is anticipating discharge in the oncoming day. She shares that she was supposed to discharge yesterday though was unable to as the lights were out at the facility.   We discussed the plan for OP Palliative support. Patient and her daughter are both in agreement with this.  We discussed if Chelsea [name] were to decline and lack in improvements that there is always the options to transition to hospice should it be needed.   Questions and concerns addressed/Palliative Support Provided.   Objective Assessment: Vital Signs Vitals:   06/25/22 0430 06/25/22 0807  BP: (!) 160/62 (!) 161/61  Pulse: 65 63  Resp: 16 18  Temp: 98.3 F (36.8 C) 98.1 F (36.7 C)  SpO2: 95% 94%   No intake or output data in the 24 hours ending 06/25/22 1009  Last Weight  Most recent update: 06/24/2022  5:50 AM    Weight  72.5 kg (159 lb 13.3 oz)            Gen:  Elderly Caucasian F in NAD HEENT: moist mucous membranes CV: Regular rate and rhythm  PULM:  On 2LPM [place], breathing nonlabored ABD: Distended w/hypoactive BS, tender to palpation EXT: No edema  Neuro: Alert and oriented x3   SUMMARY OF RECOMMENDATIONS   DNAR/DNI   MOST form on chart     TOC - OP Palliative support on discharge  Buttocks sensitivity - Chelsea [name]'s bottom cream   Plan for discharge to SNF today   Billing based on MDM: High ______________________________________________________________________________________ Lamarr Lulas [place] [facility] Team Team Cell Phone: [phone] Please utilize secure chat with additional questions, if there is no response within 30 minutes please call the above phone number  Palliative Medicine Team providers are available by phone from 7am to 7pm daily and can be reached through the team cell phone.  Should this patient require assistance outside of these hours, please call the patient's attending physician.

## 2022-07-22 NOTE — Progress Notes (Unsigned)
TC to patient daughter to review and discuss new PC referral.  [facility] only offers virtual support patients out side of [place] and [place] count.  Call unsuccessful. Sw unable to lvm due mailbox not being set up.

## 2022-07-28 NOTE — Progress Notes (Signed)
[facility] [address] [place] [address]   Clinic Day:  07/29/2022  Referring physician: Wallie Renshaw, FNP  Patient Care Team: Trisha Mangle, FNP as PCP - General (Family Medicine) Trisha Mangle, FNP as PCP - Family Medicine (Family Medicine) Jodelle Red, MD as PCP - Cardiology (Cardiology)   ASSESSMENT & PLAN:   Assessment:  1.  Normocytic anemia: - CBC (07/28/2022): Hb-9.2, MCV-95.  Elevated creatinine since 2021. - On iron tablet daily for last 4 years.  Denies BRBPR/melena. - Never had colonoscopy.  Stool was reportedly negative for occult blood when checked recently. - No prior transfusion.  No prior IV iron.  Denies ice pica.  2.  Social/family history: - Her daughter lives with her at home.  She has been on oxygen via nasal cannula for the last 1 month since her discharge from the hospital.  She is currently using oxygen mostly at nighttime.  She worked in [employer].  Denies exposure to asbestos.  Quit smoking 4 years ago when she had CVA.  Smoked 1 pack/day starting around age 57. - Mother had uterine cancer.   Plan:  1.  Normocytic anemia: - Normocytic anemia in the setting of CKD and likely functional iron deficiency. - Will rule out other nutritional deficiencies including B12, folic acid, MMA, copper.  Will check for hemolysis.  Will also rule out bone marrow infiltrative process with checking immunofixation, free light chains, protein electrophoresis. - We discussed options of parenteral iron therapy if her iron indices are low. - She will need premedication with steroids, H1 and H2 blockers for iron infusion because of history of severe allergies. - We also discussed the role of erythropoiesis stimulating agents if her iron levels are adequate, to keep hemoglobin above 10. - She will come back in 2 weeks for follow-up.   Orders Placed This Encounter  Procedures   Ferritin    Standing Status:    Future    Number of Occurrences:   1    Standing Expiration Date:   07/29/2023    Order Specific Question:   Release to patient    Answer:   Immediate   Iron and TIBC    Standing Status:   Future    Number of Occurrences:   1    Standing Expiration Date:   07/29/2023    Order Specific Question:   Release to patient    Answer:   Immediate   Vitamin B12    Standing Status:   Future    Number of Occurrences:   1    Standing Expiration Date:   07/29/2023    Order Specific Question:   Release to patient    Answer:   Immediate   Folate    Standing Status:   Future    Number of Occurrences:   1    Standing Expiration Date:   07/29/2023    Order Specific Question:   Release to patient    Answer:   Immediate   Methylmalonic acid, serum    Standing Status:   Future    Number of Occurrences:   1    Standing Expiration Date:   07/29/2023   Copper, serum    Standing Status:   Future    Number of Occurrences:   1    Standing Expiration Date:   07/29/2023   Immunofixation electrophoresis    Standing Status:   Future    Number of Occurrences:   1  Standing Expiration Date:   07/29/2023    Order Specific Question:   Release to patient    Answer:   Immediate   Protein electrophoresis, serum    Standing Status:   Future    Number of Occurrences:   1    Standing Expiration Date:   07/29/2023    Order Specific Question:   Release to patient    Answer:   Immediate   Kappa/lambda light chains    Standing Status:   Future    Number of Occurrences:   1    Standing Expiration Date:   07/29/2023   Lactate dehydrogenase    Standing Status:   Future    Number of Occurrences:   1    Standing Expiration Date:   07/29/2023    Order Specific Question:   Release to patient    Answer:   Immediate   Reticulocytes    Standing Status:   Future    Number of Occurrences:   1    Standing Expiration Date:   07/29/2023   Direct antiglobulin test    Standing Status:   Future    Number of Occurrences:   1     Standing Expiration Date:   07/29/2023      Alben Deeds Teague,acting as a scribe for Doreatha Massed, MD.,have documented all relevant documentation on the behalf of Doreatha Massed, MD,as directed by  Doreatha Massed, MD while in the presence of Doreatha Massed, MD.   I, Doreatha Massed MD, have reviewed the above documentation for accuracy and completeness, and I agree with the above.   Doreatha Massed, MD   7/18/20244:39 PM  CHIEF COMPLAINT/PURPOSE OF CONSULT:   Diagnosis: anemia  Current Therapy: Under workup  HISTORY OF PRESENT ILLNESS:   Nelwyn is a 77 y.o. female presenting to clinic today for evaluation of anemia at the request of Wallie Renshaw, FNP.  Her last CBC from 7/8 found low RBC at 2.47, low hemoglobin at 7.8, and low HCT at 23.4. Her CMP from 7/2 found elevated blood glucose at 171, elevated BUN at 45.3, elevated creatinine at 2.21, low sodium at 130, low chloride at 95, low total protein at 5.6, elevated alkphos at 218, and low globulin at 1.9. Her CBC from 7/2 found low RBC at 2.61, low hemoglobin at 8.2, low HCT at 24.8, and elevated MCV at 95.3.   Today, she states that she is doing well overall. Her appetite level is at 75%. Her energy level is at 25%.  PAST MEDICAL HISTORY:   Past Medical History: Past Medical History:  Diagnosis Date   Allergy    Anemia    Blind left eye    Cardiac arrest (HCC)    Carotid artery occlusion    Cataract    Depression    Diabetes mellitus without complication (HCC)    Hyperlipidemia    Hypertension    Oxygen deficiency    Stroke [facility])     Surgical History: Past Surgical History:  Procedure Laterality Date   ENDARTERECTOMY Left 12/03/2019   Procedure: LEFT CAROTID ENDARTERECTOMY;  Surgeon: Chuck Hint, MD;  Location: [facility] OR;  Service: Vascular;  Laterality: Left;   INTRAMEDULLARY (IM) NAIL INTERTROCHANTERIC Right 06/14/2022   Procedure: INTRAMEDULLARY (IM) NAIL  INTERTROCHANTERIC, REPAIR OF RIGHT HIP FRACTURE;  Surgeon: Myrene Galas, MD;  Location: [facility] OR;  Service: Orthopedics;  Laterality: Right;   PATCH ANGIOPLASTY Left 12/03/2019   Procedure: PATCH ANGIOPLASTY Left Carotid Artery;  Surgeon: Chuck Hint, MD;  Location: [facility] OR;  Service: Vascular;  Laterality: Left;    Social History: Social History   Socioeconomic History   Marital status: Divorced    Spouse name: Not on file   Number of children: Not on file   Years of education: Not on file   Highest education level: Not on file  Occupational History   Not on file  Tobacco Use   Smoking status: Former    Types: Cigarettes   Smokeless tobacco: Never  Vaping Use   Vaping status: Never Used  Substance and Sexual Activity   Alcohol use: Not Currently   Drug use: Never   Sexual activity: Not Currently  Other Topics Concern   Not on file  Social History Narrative   Lives with daughter, [name]   Right Handed   Drinks no caffeine   Social Determinants of Health   Financial Resource Strain: Not on file  Food Insecurity: No Food Insecurity (06/13/2022)   Hunger Vital Sign    Worried About Running Out of Food in the Last Year: Never true    Ran Out of Food in the Last Year: Never true  Transportation Needs: No Transportation Needs (06/13/2022)   PRAPARE - Administrator, Civil Service (Medical): No    Lack of Transportation (Non-Medical): No  Physical Activity: Not on file  Stress: Not on file  Social Connections: Unknown (05/24/2021)   [facility]   Social Network    Social Network: Not on file  Intimate Partner Violence: Not At Risk (06/13/2022)   Humiliation, Afraid, Rape, and Kick questionnaire    Fear of Current or Ex-Partner: No    Emotionally Abused: No    Physically Abused: No    Sexually Abused: No    Family History: Family History  Problem Relation Age of Onset   Hypertension Mother    Hyperlipidemia Mother    Diabetes Mother     Cancer Mother    Arthritis Mother    Heart attack Mother    Hypertension Father    Hyperlipidemia Father    Diabetes Father    Arthritis Father    Stroke Father    Hypertension Sister    Heart disease Sister    Heart attack Sister    Diabetes Sister    Depression Sister    COPD Sister    Arthritis Sister    Stroke Brother    Hypertension Brother    Heart disease Brother    Diabetes Brother    Depression Brother    Arthritis Brother    Stroke Daughter    Hypertension Daughter    Diabetes Daughter    Depression Daughter    Arthritis Daughter    Diabetes Son    Depression Son     Current Medications:  Current Outpatient Medications:    acetaminophen (TYLENOL) 325 MG tablet, Take 1-2 tablets (325-650 mg total) by mouth every 6 (six) hours as needed for mild pain (pain score 1-3 or temp > 100.5)., Disp: 60 tablet, Rfl: 0   aspirin 81 MG chewable tablet, Chew 1 tablet (81 mg total) by mouth daily., Disp: , Rfl:    carvedilol (COREG) 3.125 MG tablet, TAKE ONE TABLET TWICE DAILY WITH MEAL(S), Disp: 180 tablet, Rfl: 2   cholecalciferol (CHOLECALCIFEROL) 25 MCG tablet, Take 2 tablets (2,000 Units total) by mouth daily., Disp: , Rfl:    clopidogrel (PLAVIX) 75 MG tablet, TAKE 1 TABLET ONCE DAILY, Disp: 90 tablet, Rfl: 3   Docusate Sodium (  STOOL SOFTENER) 100 MG capsule, Take 1 tablet (100 mg total) by mouth 2 (two) times daily., Disp: 10 tablet, Rfl: 0   feeding supplement, GLUCERNA SHAKE, (GLUCERNA SHAKE) LIQD, Take 237 mLs by mouth 3 (three) times daily between meals., Disp: , Rfl: 0   Ferrous Sulfate (IRON) 325 (65 Fe) MG TABS, Take 325 mg by mouth every other day., Disp: , Rfl:    guaiFENesin (MUCINEX) 600 MG 12 hr tablet, Take 1 tablet (600 mg total) by mouth 2 (two) times daily. (Patient taking differently: Take 600 mg by mouth 2 (two) times daily as needed.), Disp: 14 tablet, Rfl: 0   HYDROcodone-acetaminophen (NORCO/VICODIN) 5-325 MG tablet, Take 1 tablet by mouth every 6  (six) hours as needed for moderate pain or severe pain (pain score 4-6)., Disp: 30 tablet, Rfl: 0   KERENDIA 10 MG TABS, Take 1 tablet by mouth daily., Disp: , Rfl:    Multiple Vitamin (MULTIVITAMIN WITH MINERALS) TABS tablet, Take 1 tablet by mouth daily., Disp: , Rfl:    olmesartan (BENICAR) 20 MG tablet, Take 10 mg by mouth daily., Disp: , Rfl:    Polyethyl Glycol-Propyl Glycol (SYSTANE FREE OP), Place 1 drop into both eyes daily., Disp: , Rfl:    polyethylene glycol (MIRALAX / GLYCOLAX) 17 g packet, Take 17 g by mouth daily as needed for moderate constipation., Disp: 14 each, Rfl: 0   psyllium (HYDROCIL/METAMUCIL) 95 % PACK, Take 1 packet by mouth daily., Disp: 240 each, Rfl: 0   rosuvastatin (CRESTOR) 5 MG tablet, Take 1 tablet (5 mg total) by mouth daily., Disp: 90 tablet, Rfl: 2   senna-docusate (SENOKOT-S) 8.6-50 MG tablet, Take 1 tablet by mouth at bedtime as needed for mild constipation., Disp: , Rfl:    tamsulosin (FLOMAX) 0.4 MG CAPS capsule, Take 0.4 mg by mouth every evening., Disp: , Rfl:    torsemide (DEMADEX) 20 MG tablet, Take 20 mg by mouth daily. Takes 40 mg if swelling, Disp: , Rfl:    Allergies: Allergies  Allergen Reactions   Codeine Anaphylaxis and Swelling    Throat swells     Latex Anaphylaxis   Penicillins Anaphylaxis and Swelling    Throat closes   Tolerated Cephalosporin Date: 06/14/22.     Sulfa Antibiotics Anaphylaxis, Other (See Comments) and Swelling    Lips and eye swell   Benzodiazepines Hives   Cephalosporins Other (See Comments)    Tolerated Cephalosporin Date: 06/14/22.     Lidocaine Hives   Oxycodone-Acetaminophen Palpitations    Other Reaction(s): Other (See Comments)  unknown   Trazodone Other (See Comments)    Sweating  Other Reaction(s): Other (See Comments)  unknown   Atorvastatin Nausea And Vomiting    Vomiting  Other Reaction(s): GI Intolerance  Vomiting    Cephalexin Itching    Tolerating rocephin 06/25/20  Tolerated  Cephalosporin Date: 06/14/22.    Other Reaction(s): Other (See Comments)  unknown   Gabapentin Nausea And Vomiting    Other Reaction(s): GI Intolerance   Pregabalin Nausea And Vomiting    Vomiting  Other Reaction(s): GI Intolerance  Vomiting     REVIEW OF SYSTEMS:   Review of Systems  Constitutional:  Negative for chills, fatigue and fever.  HENT:   Negative for lump/mass, mouth sores, nosebleeds, sore throat and trouble swallowing.   Eyes:  Negative for eye problems.  Respiratory:  Positive for cough and shortness of breath.   Cardiovascular:  Negative for chest pain, leg swelling and palpitations.  Gastrointestinal:  Positive  for constipation. Negative for abdominal pain, diarrhea, nausea and vomiting.  Genitourinary:  Negative for bladder incontinence, difficulty urinating, dysuria, frequency, hematuria and nocturia.   Musculoskeletal:  Negative for arthralgias, back pain, flank pain, myalgias and neck pain.  Skin:  Negative for itching and rash.  Neurological:  Positive for numbness. Negative for dizziness and headaches.  Hematological:  Does not bruise/bleed easily.  Psychiatric/Behavioral:  Positive for depression. Negative for sleep disturbance and suicidal ideas. The patient is nervous/anxious.   All other systems reviewed and are negative.    VITALS:   Blood pressure (!) 133/45, pulse 72, temperature 98.1 F (36.7 C), resp. rate 18, weight 136 lb 3.2 oz (61.8 kg), SpO2 100%.  Wt Readings from Last 3 Encounters:  07/29/22 136 lb 3.2 oz (61.8 kg)  06/24/22 159 lb 13.3 oz (72.5 kg)  04/29/22 153 lb 4.8 oz (69.5 kg)    Body mass index is 21.33 kg/m.   PHYSICAL EXAM:   Physical Exam Vitals and nursing note reviewed. Exam conducted with a chaperone present.  Constitutional:      Appearance: Normal appearance.  Cardiovascular:     Rate and Rhythm: Normal rate and regular rhythm.     Pulses: Normal pulses.     Heart sounds: Normal heart sounds.  Pulmonary:      Effort: Pulmonary effort is normal.     Breath sounds: Normal breath sounds.  Abdominal:     Palpations: Abdomen is soft. There is no hepatomegaly, splenomegaly or mass.     Tenderness: There is no abdominal tenderness.  Musculoskeletal:     Right lower leg: No edema.     Left lower leg: No edema.  Lymphadenopathy:     Cervical: No cervical adenopathy.     Right cervical: No superficial, deep or posterior cervical adenopathy.    Left cervical: No superficial, deep or posterior cervical adenopathy.     Upper Body:     Right upper body: No supraclavicular or axillary adenopathy.     Left upper body: No supraclavicular or axillary adenopathy.  Neurological:     General: No focal deficit present.     Mental Status: She is alert and oriented to person, place, and time.  Psychiatric:        Mood and Affect: Mood normal.        Behavior: Behavior normal.     LABS:      Latest Ref Rng & Units 06/25/2022    1:47 AM 06/24/2022    7:55 AM 06/22/2022    6:19 AM  CBC  WBC 4.0 - 10.5 K/uL 9.7  9.7  12.8   Hemoglobin 12.0 - 15.0 g/dL 7.8  8.0  8.2   Hematocrit 36.0 - 46.0 % 25.0  25.5  26.6   Platelets 150 - 400 K/uL 431  428  419       Latest Ref Rng & Units 06/25/2022    1:47 AM 06/24/2022    7:55 AM 06/22/2022    2:08 AM  CMP  Glucose 70 - 99 mg/dL 161  096  045   BUN 8 - 23 mg/dL 25  27  34   Creatinine 0.44 - 1.00 mg/dL 4.09  8.11  9.14   Sodium 135 - 145 mmol/L 139  137  141   Potassium 3.5 - 5.1 mmol/L 3.8  3.5  4.3   Chloride 98 - 111 mmol/L 106  104  106   CO2 22 - 32 mmol/L 26  26  29  Calcium 8.9 - 10.3 mg/dL 7.8  7.8  8.0   Total Protein 6.5 - 8.1 g/dL 4.7  4.8  4.8   Total Bilirubin 0.3 - 1.2 mg/dL 0.6  0.6  0.9   Alkaline Phos 38 - 126 U/L 120  94  76   AST 15 - 41 U/L 16  14  17    ALT 0 - 44 U/L 13  12  16       No results found for: "CEA1", "CEA" / No results found for: "CEA1", "CEA" No results found for: "PSA1" No results found for: "ZOX096" No results  found for: "CAN125"  No results found for: "TOTALPROTELP", "ALBUMINELP", "A1GS", "A2GS", "BETS", "BETA2SER", "GAMS", "MSPIKE", "SPEI" Lab Results  Component Value Date   TIBC 265 10/09/2020   IRONPCTSAT 27 10/09/2020   Lab Results  Component Value Date   LDH 119 07/29/2022     STUDIES:   No results found.

## 2022-07-29 ENCOUNTER — Inpatient Hospital Stay: Payer: PPO | Attending: Hematology | Admitting: Hematology

## 2022-07-29 ENCOUNTER — Encounter: Payer: Self-pay | Admitting: Hematology

## 2022-07-29 VITALS — BP 133/45 | HR 72 | Temp 98.1°F | Resp 18 | Wt 136.2 lb

## 2022-07-29 DIAGNOSIS — D649 Anemia, unspecified: Secondary | ICD-10-CM

## 2022-07-29 DIAGNOSIS — Z87891 Personal history of nicotine dependence: Secondary | ICD-10-CM | POA: Diagnosis not present

## 2022-07-29 DIAGNOSIS — N189 Chronic kidney disease, unspecified: Secondary | ICD-10-CM | POA: Diagnosis not present

## 2022-07-29 DIAGNOSIS — R944 Abnormal results of kidney function studies: Secondary | ICD-10-CM | POA: Insufficient documentation

## 2022-07-29 DIAGNOSIS — Z8049 Family history of malignant neoplasm of other genital organs: Secondary | ICD-10-CM | POA: Insufficient documentation

## 2022-07-29 DIAGNOSIS — D631 Anemia in chronic kidney disease: Secondary | ICD-10-CM | POA: Insufficient documentation

## 2022-07-29 LAB — IRON AND TIBC
Iron: 34 ug/dL (ref 28–170)
Saturation Ratios: 14 % (ref 10.4–31.8)
TIBC: 248 ug/dL — ABNORMAL LOW (ref 250–450)
UIBC: 214 ug/dL

## 2022-07-29 LAB — DIRECT ANTIGLOBULIN TEST (NOT AT ARMC)
DAT, IgG: NEGATIVE
DAT, complement: NEGATIVE

## 2022-07-29 LAB — VITAMIN B12: Vitamin B-12: 553 pg/mL (ref 180–914)

## 2022-07-29 LAB — FERRITIN: Ferritin: 172 ng/mL (ref 11–307)

## 2022-07-29 LAB — RETICULOCYTES
Immature Retic Fract: 9.1 % (ref 2.3–15.9)
RBC.: 2.65 MIL/uL — ABNORMAL LOW (ref 3.87–5.11)
Retic Count, Absolute: 56.7 10*3/uL (ref 19.0–186.0)
Retic Ct Pct: 2.1 % (ref 0.4–3.1)

## 2022-07-29 LAB — FOLATE: Folate: 13.7 ng/mL (ref >5.9)

## 2022-07-29 LAB — LACTATE DEHYDROGENASE: LDH: 119 U/L (ref 98–192)

## 2022-07-29 NOTE — Patient Instructions (Signed)
[facility]  Discharge Instructions  You were seen and examined today by Dr. Ellin Saba. Dr. Ellin Saba is a hematologist, meaning that he specializes in blood abnormalities. Dr. Ellin Saba discussed your past medical history, family history of cancers/blood conditions and the events that led to you being here today.  You were referred to Dr. Ellin Saba due to Anemia.  Dr. Ellin Saba has recommended additional labs today for further evaluation.  Follow-up as scheduled in 2 weeks.    Thank you for choosing [facility] to provide your oncology and hematology care.   To afford each patient quality time with our provider, please arrive at least 15 minutes before your scheduled appointment time. You may need to reschedule your appointment if you arrive late (10 or more minutes). Arriving late affects you and other patients whose appointments are after yours.  Also, if you miss three or more appointments without notifying the office, you may be dismissed from the clinic at the provider's discretion.    Again, thank you for choosing [facility].  Our hope is that these requests will decrease the amount of time that you wait before being seen by our physicians.   If you have a lab appointment with the Cancer Center - please note that after April 8th, all labs will be drawn in the cancer center.  You do not have to check in or register with the main entrance as you have in the past but will complete your check-in at the cancer center.            _____________________________________________________________  Should you have questions after your visit to [facility], please contact our office at [phone] and follow the prompts.  Our office hours are 8:00 a.m. to 4:30 p.m. Monday - Thursday and 8:00 a.m. to 2:30 p.m. Friday.  Please note that voicemails left after 4:00 p.m. may not be returned until the following  business day.  We are closed weekends and all major holidays.  You do have access to a nurse 24-7, just call the main number to the clinic [phone] and do not press any options, hold on the line and a nurse will answer the phone.    For prescription refill requests, have your pharmacy contact our office and allow 72 hours.    Masks are no longer required in the cancer centers. If you would like for your care team to wear a mask while they are taking care of you, please let them know. You may have one support person who is at least 78 years old accompany you for your appointments.

## 2022-07-30 LAB — KAPPA/LAMBDA LIGHT CHAINS
Kappa free light chain: 65.9 mg/L — ABNORMAL HIGH (ref 3.3–19.4)
Kappa, lambda light chain ratio: 1.6 (ref 0.26–1.65)
Lambda free light chains: 41.1 mg/L — ABNORMAL HIGH (ref 5.7–26.3)

## 2022-07-31 LAB — COPPER, SERUM: Copper: 116 ug/dL (ref 80–158)

## 2022-08-01 LAB — PROTEIN ELECTROPHORESIS, SERUM
A/G Ratio: 1.6 (ref 0.7–1.7)
Albumin ELP: 3.7 g/dL (ref 2.9–4.4)
Alpha-1-Globulin: 0.2 g/dL (ref 0.0–0.4)
Alpha-2-Globulin: 0.6 g/dL (ref 0.4–1.0)
Beta Globulin: 0.7 g/dL (ref 0.7–1.3)
Gamma Globulin: 0.8 g/dL (ref 0.4–1.8)
Globulin, Total: 2.3 g/dL (ref 2.2–3.9)
Total Protein ELP: 6 g/dL (ref 6.0–8.5)

## 2022-08-01 LAB — IMMUNOFIXATION ELECTROPHORESIS
IgA: 156 mg/dL (ref 64–422)
IgG (Immunoglobin G), Serum: 865 mg/dL (ref 586–1602)
IgM (Immunoglobulin M), Srm: 82 mg/dL (ref 26–217)
Total Protein ELP: 5.9 g/dL — ABNORMAL LOW (ref 6.0–8.5)

## 2022-08-02 LAB — METHYLMALONIC ACID, SERUM: Methylmalonic Acid, Quantitative: 635 nmol/L — ABNORMAL HIGH (ref 0–378)

## 2022-08-11 NOTE — Progress Notes (Signed)
[facility] [address], [place] [address]   Clinic Day:  07/29/2022  Referring physician: Trisha Mangle, *  Patient Care Team: Trisha Mangle, FNP as PCP - General (Family Medicine) Trisha Mangle, FNP as PCP - Family Medicine (Family Medicine) Jodelle Red, MD as PCP - Cardiology (Cardiology)   ASSESSMENT & PLAN:   Assessment:  1.  Normocytic anemia: - CBC (07/28/2022): Hb-9.2, MCV-95.  Elevated creatinine since 2021. - On iron tablet daily for last 4 years.  Denies BRBPR/melena. - Never had colonoscopy.  Stool was reportedly negative for occult blood when checked recently. - No prior transfusion.  No prior IV iron.  Denies ice pica.  2.  Social/family history: - Her daughter lives with her at home.  She has been on oxygen via nasal cannula for the last 1 month since her discharge from the hospital.  She is currently using oxygen mostly at nighttime.  She worked in [employer].  Denies exposure to asbestos.  Quit smoking 4 years ago when she had CVA.  Smoked 1 pack/day starting around age 31. - Mother had uterine cancer.  Plan:  1.  Normocytic anemia: - Etiology likely secondary to CKD and relative iron deficiency anemia. -Has had chronic kidney disease since October 2021 and anemia since November 2021.  Hemoglobin has ranged from 7.4-11.7 and creatinine has ranged from normal to 2.80.  -Most recent lab work shows a creatinine of 1.73 and hemoglobin of 9.2. -Reviewed labs from 07/29/2022 which shows no evidence of hemolysis with normal LDH, and direct antiglobulin test.  Reticulocytes are within normal range.  No evidence of folic acid or copper deficiency.  B12 levels are normal but MMA is elevated.  Iron levels show ferritin of 172 with iron saturation of 14%.  In the setting of chronic kidney disease, iron saturation should be closer to 30%.  No evidence of bone marrow infiltrative process with normal immunofixation,  free light chains and protein electrophoresis.  No evidence of an M spike.  Kappa lambda free light chains are both slightly elevated secondary to CKD.  Kappa lambda light chain ratio is WNL.   -Recommend 2 doses of IV feraheme and monthly B12 and will recheck labs in 4 to 6 weeks.  -No improvement would recommend initiating Retacrit for CKD.   2. Right Hip fracture: -Hospitalized from 06/12/2022-06/25/22 for right mechanical fall and right hip fracture requiring surgery. -She was sent to skilled nursing facility for rehab for about 10 days.  -She continues to recover but is doing well.   PLAN SUMMARY: >> Recommend 2 doses of IV Feraheme and monthly B12 x 3.   >> If no improvement, would recommend initiating Retacrit. >> Follow-up in 4 to 6 weeks with lab work (CBC, ferritin, iron panel, B12 and MMA) and CMP/MD.     No orders of the defined types were placed in this encounter.  I spent 25 minutes dedicated to the care of this patient (face-to-face and non-face-to-face) on the date of the encounter to include what is described in the assessment and plan.   Mauro Kaufmann, NP   8/2/20248:58 PM  CHIEF COMPLAINT/PURPOSE OF CONSULT:   Diagnosis: anemia  Current Therapy: Under workup  HISTORY OF PRESENT ILLNESS:   Chelsea [name] is a 77 y.o. female presenting to clinic today for follow-up for anemia.    Reports a mechanical fall 06/12/22-06/25/22 requiring surgery and SNF.  Had postop complications with ileus that resolved with medical management.  She has since been discharged home and feels like she is getting better.  She presents today with her daughter.  Reports she continues to recover from her fall but she is happy to be home.  Reports she continues to take iron supplements but has chronic constipation.  Reports she takes MiraLAX each morning and a stool softener at bedtime.  Occasionally will eat prunes.  She continues to use a walker for ambulation.  Denies any melena, hematochezia,  nosebleeds, gingival bleeding.  Has occasional shortness of breath and uses 2 L of oxygen at bedtime.  She recently recovered from a urinary tract infection.  Today, she states that she is doing well overall. Her appetite level is at 100%. Her energy level is at 70%.  PAST MEDICAL HISTORY:   Past Medical History: Past Medical History:  Diagnosis Date   Allergy    Anemia    Blind left eye    Cardiac arrest (HCC)    Carotid artery occlusion    Cataract    Depression    Diabetes mellitus without complication (HCC)    Hyperlipidemia    Hypertension    Oxygen deficiency    Stroke [facility])     Surgical History: Past Surgical History:  Procedure Laterality Date   ENDARTERECTOMY Left 12/03/2019   Procedure: LEFT CAROTID ENDARTERECTOMY;  Surgeon: Chuck Hint, MD;  Location[facility] OR;  Service: Vascular;  Laterality: Left;   INTRAMEDULLARY (IM) NAIL INTERTROCHANTERIC Right 06/14/2022   Procedure: INTRAMEDULLARY (IM) NAIL INTERTROCHANTERIC, REPAIR OF RIGHT HIP FRACTURE;  Surgeon: Myrene Galas, MD;  Location: [facility] OR;  Service: Orthopedics;  Laterality: Right;   PATCH ANGIOPLASTY Left 12/03/2019   Procedure: PATCH ANGIOPLASTY Left Carotid Artery;  Surgeon: Chuck Hint, MD;  Location: [facility] OR;  Service: Vascular;  Laterality: Left;    Social History: Social History   Socioeconomic History   Marital status: Divorced    Spouse name: Not on file   Number of children: Not on file   Years of education: Not on file   Highest education level: Not on file  Occupational History   Not on file  Tobacco Use   Smoking status: Former    Types: Cigarettes   Smokeless tobacco: Never  Vaping Use   Vaping status: Never Used  Substance and Sexual Activity   Alcohol use: Not Currently   Drug use: Never   Sexual activity: Not Currently  Other Topics Concern   Not on file  Social History Narrative   Lives with daughter, Chelsea [name]   Right Handed   Drinks no caffeine   Social  Determinants of Health   Financial Resource Strain: Not on file  Food Insecurity: No Food Insecurity (06/13/2022)   Hunger Vital Sign    Worried About Running Out of Food in the Last Year: Never true    Ran Out of Food in the Last Year: Never true  Transportation Needs: No Transportation Needs (06/13/2022)   PRAPARE - Administrator, Civil Service (Medical): No    Lack of Transportation (Non-Medical): No  Physical Activity: Not on file  Stress: Not on file  Social Connections: Unknown (05/24/2021)   [facility] from [facility]   Social Network    Social Network: Not on file  Intimate Partner Violence: Not At Risk (06/13/2022)   Humiliation, Afraid, Rape, and Kick questionnaire    Fear of Current or Ex-Partner: No    Emotionally Abused: No    Physically Abused: No  Sexually Abused: No    Family History: Family History  Problem Relation Age of Onset   Hypertension Mother    Hyperlipidemia Mother    Diabetes Mother    Cancer Mother    Arthritis Mother    Heart attack Mother    Hypertension Father    Hyperlipidemia Father    Diabetes Father    Arthritis Father    Stroke Father    Hypertension Sister    Heart disease Sister    Heart attack Sister    Diabetes Sister    Depression Sister    COPD Sister    Arthritis Sister    Stroke Brother    Hypertension Brother    Heart disease Brother    Diabetes Brother    Depression Brother    Arthritis Brother    Stroke Daughter    Hypertension Daughter    Diabetes Daughter    Depression Daughter    Arthritis Daughter    Diabetes Son    Depression Son     Current Medications:  Current Outpatient Medications:    acetaminophen (TYLENOL) 325 MG tablet, Take 1-2 tablets (325-650 mg total) by mouth every 6 (six) hours as needed for mild pain (pain score 1-3 or temp > 100.5)., Disp: 60 tablet, Rfl: 0   aspirin 81 MG chewable tablet, Chew 1 tablet (81 mg total) by mouth daily., Disp: , Rfl:    carvedilol  (COREG) 3.125 MG tablet, TAKE ONE TABLET TWICE DAILY WITH MEAL(S), Disp: 180 tablet, Rfl: 2   cholecalciferol (CHOLECALCIFEROL) 25 MCG tablet, Take 2 tablets (2,000 Units total) by mouth daily., Disp: , Rfl:    clopidogrel (PLAVIX) 75 MG tablet, TAKE 1 TABLET ONCE DAILY, Disp: 90 tablet, Rfl: 3   Docusate Sodium (STOOL SOFTENER) 100 MG capsule, Take 1 tablet (100 mg total) by mouth 2 (two) times daily., Disp: 10 tablet, Rfl: 0   feeding supplement, GLUCERNA SHAKE, (GLUCERNA SHAKE) LIQD, Take 237 mLs by mouth 3 (three) times daily between meals., Disp: , Rfl: 0   Ferrous Sulfate (IRON) 325 (65 Fe) MG TABS, Take 325 mg by mouth every other day., Disp: , Rfl:    guaiFENesin (MUCINEX) 600 MG 12 hr tablet, Take 1 tablet (600 mg total) by mouth 2 (two) times daily. (Patient taking differently: Take 600 mg by mouth 2 (two) times daily as needed.), Disp: 14 tablet, Rfl: 0   HYDROcodone-acetaminophen (NORCO/VICODIN) 5-325 MG tablet, Take 1 tablet by mouth every 6 (six) hours as needed for moderate pain or severe pain (pain score 4-6)., Disp: 30 tablet, Rfl: 0   KERENDIA 10 MG TABS, Take 1 tablet by mouth daily., Disp: , Rfl:    Multiple Vitamin (MULTIVITAMIN WITH MINERALS) TABS tablet, Take 1 tablet by mouth daily., Disp: , Rfl:    olmesartan (BENICAR) 20 MG tablet, Take 10 mg by mouth daily., Disp: , Rfl:    Polyethyl Glycol-Propyl Glycol (SYSTANE FREE OP), Place 1 drop into both eyes daily., Disp: , Rfl:    polyethylene glycol (MIRALAX / GLYCOLAX) 17 g packet, Take 17 g by mouth daily as needed for moderate constipation., Disp: 14 each, Rfl: 0   psyllium (HYDROCIL/METAMUCIL) 95 % PACK, Take 1 packet by mouth daily., Disp: 240 each, Rfl: 0   rosuvastatin (CRESTOR) 5 MG tablet, Take 1 tablet (5 mg total) by mouth daily., Disp: 90 tablet, Rfl: 2   senna-docusate (SENOKOT-S) 8.6-50 MG tablet, Take 1 tablet by mouth at bedtime as needed for mild constipation., Disp: , Rfl:  tamsulosin (FLOMAX) 0.4 MG CAPS  capsule, Take 0.4 mg by mouth every evening., Disp: , Rfl:    torsemide (DEMADEX) 20 MG tablet, Take 20 mg by mouth daily. Takes 40 mg if swelling, Disp: , Rfl:    Allergies: Allergies  Allergen Reactions   Codeine Anaphylaxis and Swelling    Throat swells     Latex Anaphylaxis   Penicillins Anaphylaxis and Swelling    Throat closes   Tolerated Cephalosporin Date: 06/14/22.     Sulfa Antibiotics Anaphylaxis, Other (See Comments) and Swelling    Lips and eye swell   Benzodiazepines Hives   Cephalosporins Other (See Comments)    Tolerated Cephalosporin Date: 06/14/22.     Lidocaine Hives   Oxycodone-Acetaminophen Palpitations    Other Reaction(s): Other (See Comments)  unknown   Trazodone Other (See Comments)    Sweating  Other Reaction(s): Other (See Comments)  unknown   Atorvastatin Nausea And Vomiting    Vomiting  Other Reaction(s): GI Intolerance  Vomiting    Cephalexin Itching    Tolerating rocephin 06/25/20  Tolerated Cephalosporin Date: 06/14/22.    Other Reaction(s): Other (See Comments)  unknown   Gabapentin Nausea And Vomiting    Other Reaction(s): GI Intolerance   Pregabalin Nausea And Vomiting    Vomiting  Other Reaction(s): GI Intolerance  Vomiting     REVIEW OF SYSTEMS:   Review of Systems  Constitutional: Negative.  Negative for appetite change, chills, fatigue and fever.  HENT:  Negative.  Negative for hearing loss, lump/mass, mouth sores and nosebleeds.   Eyes: Negative.  Negative for eye problems.  Respiratory:  Positive for shortness of breath. Negative for cough and hemoptysis.   Cardiovascular: Negative.  Negative for chest pain and leg swelling.  Gastrointestinal: Negative.  Negative for abdominal pain, blood in stool, constipation, diarrhea, nausea and vomiting.  Endocrine: Negative.  Negative for hot flashes.  Genitourinary: Negative.  Negative for bladder incontinence, difficulty urinating, dysuria, frequency and hematuria.    Musculoskeletal:  Positive for arthralgias (Right hip pain). Negative for back pain, flank pain, gait problem and myalgias.  Skin: Negative.  Negative for itching and rash.  Neurological: Negative.  Negative for dizziness, gait problem, headaches, light-headedness and numbness.  Hematological: Negative.  Negative for adenopathy.  Psychiatric/Behavioral:  Negative for confusion. The patient is not nervous/anxious.      VITALS:   Blood pressure (!) 121/53, pulse 72, temperature 98.4 F (36.9 C), temperature source Tympanic, resp. rate 16, SpO2 100%.  Wt Readings from Last 3 Encounters:  07/29/22 136 lb 3.2 oz (61.8 kg)  06/24/22 159 lb 13.3 oz (72.5 kg)  04/29/22 153 lb 4.8 oz (69.5 kg)    There is no height or weight on file to calculate BMI.   PHYSICAL EXAM:   Physical Exam Constitutional:      Appearance: Normal appearance.  Cardiovascular:     Rate and Rhythm: Normal rate and regular rhythm.  Pulmonary:     Effort: Pulmonary effort is normal.     Breath sounds: Normal breath sounds.  Abdominal:     General: Bowel sounds are normal.     Palpations: Abdomen is soft.  Musculoskeletal:        General: No swelling. Normal range of motion.  Neurological:     Mental Status: She is alert and oriented to person, place, and time. Mental status is at baseline.     LABS:      Latest Ref Rng & Units 06/25/2022    1:47  AM 06/24/2022    7:55 AM 06/22/2022    6:19 AM  CBC  WBC 4.0 - 10.5 K/uL 9.7  9.7  12.8   Hemoglobin 12.0 - 15.0 g/dL 7.8  8.0  8.2   Hematocrit 36.0 - 46.0 % 25.0  25.5  26.6   Platelets 150 - 400 K/uL 431  428  419       Latest Ref Rng & Units 06/25/2022    1:47 AM 06/24/2022    7:55 AM 06/22/2022    2:08 AM  CMP  Glucose 70 - 99 mg/dL 161  096  045   BUN 8 - 23 mg/dL 25  27  34   Creatinine 0.44 - 1.00 mg/dL 4.09  8.11  9.14   Sodium 135 - 145 mmol/L 139  137  141   Potassium 3.5 - 5.1 mmol/L 3.8  3.5  4.3   Chloride 98 - 111 mmol/L 106  104  106    CO2 22 - 32 mmol/L 26  26  29    Calcium 8.9 - 10.3 mg/dL 7.8  7.8  8.0   Total Protein 6.5 - 8.1 g/dL 4.7  4.8  4.8   Total Bilirubin 0.3 - 1.2 mg/dL 0.6  0.6  0.9   Alkaline Phos 38 - 126 U/L 120  94  76   AST 15 - 41 U/L 16  14  17    ALT 0 - 44 U/L 13  12  16       No results found for: "CEA1", "CEA" / No results found for: "CEA1", "CEA" No results found for: "PSA1" No results found for: "CAN199" No results found for: "CAN125"  Lab Results  Component Value Date   TOTALPROTELP 6.0 07/29/2022   TOTALPROTELP 5.9 (L) 07/29/2022   ALBUMINELP 3.7 07/29/2022   A1GS 0.2 07/29/2022   A2GS 0.6 07/29/2022   BETS 0.7 07/29/2022   GAMS 0.8 07/29/2022   MSPIKE Not Observed 07/29/2022   SPEI Comment 07/29/2022   Lab Results  Component Value Date   TIBC 248 (L) 07/29/2022   TIBC 265 10/09/2020   FERRITIN 172 07/29/2022   IRONPCTSAT 14 07/29/2022   IRONPCTSAT 27 10/09/2020   Lab Results  Component Value Date   LDH 119 07/29/2022     STUDIES:   No results found.

## 2022-08-13 ENCOUNTER — Inpatient Hospital Stay: Payer: PPO | Attending: Oncology | Admitting: Oncology

## 2022-08-13 VITALS — BP 121/53 | HR 72 | Temp 98.4°F | Resp 16

## 2022-08-13 DIAGNOSIS — D509 Iron deficiency anemia, unspecified: Secondary | ICD-10-CM

## 2022-08-13 DIAGNOSIS — D649 Anemia, unspecified: Secondary | ICD-10-CM | POA: Insufficient documentation

## 2022-08-13 DIAGNOSIS — S72001A Fracture of unspecified part of neck of right femur, initial encounter for closed fracture: Secondary | ICD-10-CM | POA: Diagnosis not present

## 2022-08-19 MED FILL — Ferumoxytol Inj 510 MG/17ML (30 MG/ML) (Elemental Fe): INTRAVENOUS | Qty: 17 | Status: AC

## 2022-08-20 ENCOUNTER — Inpatient Hospital Stay: Payer: PPO

## 2022-08-20 VITALS — BP 168/66 | HR 59 | Temp 97.9°F | Resp 18

## 2022-08-20 DIAGNOSIS — D509 Iron deficiency anemia, unspecified: Secondary | ICD-10-CM | POA: Diagnosis not present

## 2022-08-20 MED ORDER — CYANOCOBALAMIN 1000 MCG/ML IJ SOLN
1000.0000 ug | Freq: Once | INTRAMUSCULAR | Status: AC
  Administered 2022-08-20: 1000 ug via INTRAMUSCULAR
  Filled 2022-08-20: qty 1

## 2022-08-20 MED ORDER — ACETAMINOPHEN 325 MG PO TABS
650.0000 mg | ORAL_TABLET | Freq: Once | ORAL | Status: AC
  Administered 2022-08-20: 650 mg via ORAL
  Filled 2022-08-20: qty 2

## 2022-08-20 MED ORDER — FAMOTIDINE IN NACL 20-0.9 MG/50ML-% IV SOLN
20.0000 mg | Freq: Once | INTRAVENOUS | Status: AC
  Administered 2022-08-20: 20 mg via INTRAVENOUS
  Filled 2022-08-20: qty 50

## 2022-08-20 MED ORDER — CETIRIZINE HCL 10 MG PO TABS
10.0000 mg | ORAL_TABLET | Freq: Once | ORAL | Status: AC
  Administered 2022-08-20: 10 mg via ORAL
  Filled 2022-08-20: qty 1

## 2022-08-20 MED ORDER — SODIUM CHLORIDE 0.9 % IV SOLN
510.0000 mg | Freq: Once | INTRAVENOUS | Status: AC
  Administered 2022-08-20: 510 mg via INTRAVENOUS
  Filled 2022-08-20: qty 510

## 2022-08-20 MED ORDER — METHYLPREDNISOLONE SODIUM SUCC 125 MG IJ SOLR
125.0000 mg | Freq: Once | INTRAMUSCULAR | Status: AC
  Administered 2022-08-20: 125 mg via INTRAVENOUS
  Filled 2022-08-20: qty 2

## 2022-08-20 MED ORDER — SODIUM CHLORIDE 0.9 % IV SOLN: Freq: Once | INTRAVENOUS | Status: AC

## 2022-08-20 NOTE — Progress Notes (Signed)
Chelsea [name] presents today for injection per the provider's orders.  B12 injection administration without incident; injection site WNL; see MAR for injection details.  Patient tolerated procedure well and without incident.  No questions or complaints noted at this time.   Discharged from clinic via wheelchair in stable condition. Alert and oriented x 3. F/U with [facility] as scheduled.

## 2022-08-20 NOTE — Patient Instructions (Signed)
[facility]  Discharge Instructions: Thank you for choosing [facility] to provide your oncology and hematology care.  If you have a lab appointment with the Cancer Center - please note that after April 8th, 2024, all labs will be drawn in the cancer center.  You do not have to check in or register with the main entrance as you have in the past but will complete your check-in in the cancer center.  Wear comfortable clothing and clothing appropriate for easy access to any Portacath or PICC line.   We strive to give you quality time with your provider. You may need to reschedule your appointment if you arrive late (15 or more minutes).  Arriving late affects you and other patients whose appointments are after yours.  Also, if you miss three or more appointments without notifying the office, you may be dismissed from the clinic at the provider's discretion.      For prescription refill requests, have your pharmacy contact our office and allow 72 hours for refills to be completed.    Today you received Feraheme IV iron and b12 injection       BELOW ARE SYMPTOMS THAT SHOULD BE REPORTED IMMEDIATELY: *FEVER GREATER THAN 100.4 F (38 C) OR HIGHER *CHILLS OR SWEATING *NAUSEA AND VOMITING THAT IS NOT CONTROLLED WITH YOUR NAUSEA MEDICATION *UNUSUAL SHORTNESS OF BREATH *UNUSUAL BRUISING OR BLEEDING *URINARY PROBLEMS (pain or burning when urinating, or frequent urination) *BOWEL PROBLEMS (unusual diarrhea, constipation, pain near the anus) TENDERNESS IN MOUTH AND THROAT WITH OR WITHOUT PRESENCE OF ULCERS (sore throat, sores in mouth, or a toothache) UNUSUAL RASH, SWELLING OR PAIN  UNUSUAL VAGINAL DISCHARGE OR ITCHING   Items with * indicate a potential emergency and should be followed up as soon as possible or go to the Emergency Department if any problems should occur.  Please show the CHEMOTHERAPY ALERT CARD or IMMUNOTHERAPY ALERT CARD at check-in to the Emergency  Department and triage nurse.  Should you have questions after your visit or need to cancel or reschedule your appointment, please contact [facility] [phone]  and follow the prompts.  Office hours are 8:00 a.m. to 4:30 p.m. Monday - Friday. Please note that voicemails left after 4:00 p.m. may not be returned until the following business day.  We are closed weekends and major holidays. You have access to a nurse at all times for urgent questions. Please call the main number to the clinic [phone] and follow the prompts.  For any non-urgent questions, you may also contact your provider using MyChart. We now offer e-Visits for anyone 8 and older to request care online for non-urgent symptoms. For details visit [email or web address].   Also download the MyChart app! Go to the app store, search "MyChart", open the app, select [place], and log in with your MyChart username and password.

## 2022-08-20 NOTE — Progress Notes (Signed)
Patient presents today for iron infusion and B12 injection. Patient is in satisfactory condition with no new complaints voiced.  Vital signs are stable.  We will proceed with infusion per provider orders.    Peripheral IV started with good blood return pre and post infusion.  Treatment given today per MD orders. Tolerated infusion without adverse affects. Vital signs stable. No complaints at this time. Discharged from clinic via wheelchair in stable condition. Alert and oriented x 3. F/U with [facility] as scheduled.

## 2022-08-24 ENCOUNTER — Institutional Professional Consult (permissible substitution): Payer: PPO | Admitting: Internal Medicine

## 2022-08-27 ENCOUNTER — Inpatient Hospital Stay: Payer: PPO

## 2022-08-27 VITALS — BP 161/65 | HR 57 | Temp 97.7°F | Resp 18

## 2022-08-27 DIAGNOSIS — D509 Iron deficiency anemia, unspecified: Secondary | ICD-10-CM | POA: Diagnosis not present

## 2022-08-27 MED ORDER — SODIUM CHLORIDE 0.9 % IV SOLN
510.0000 mg | Freq: Once | INTRAVENOUS | Status: AC
  Administered 2022-08-27: 510 mg via INTRAVENOUS
  Filled 2022-08-27: qty 510

## 2022-08-27 MED ORDER — CETIRIZINE HCL 10 MG PO TABS
10.0000 mg | ORAL_TABLET | Freq: Once | ORAL | Status: AC
  Administered 2022-08-27: 10 mg via ORAL
  Filled 2022-08-27: qty 1

## 2022-08-27 MED ORDER — ACETAMINOPHEN 325 MG PO TABS
650.0000 mg | ORAL_TABLET | Freq: Once | ORAL | Status: AC
  Administered 2022-08-27: 650 mg via ORAL
  Filled 2022-08-27: qty 2

## 2022-08-27 MED ORDER — FAMOTIDINE IN NACL 20-0.9 MG/50ML-% IV SOLN
20.0000 mg | Freq: Once | INTRAVENOUS | Status: AC
  Administered 2022-08-27: 20 mg via INTRAVENOUS
  Filled 2022-08-27: qty 50

## 2022-08-27 MED ORDER — SODIUM CHLORIDE 0.9 % IV SOLN: Freq: Once | INTRAVENOUS | Status: AC

## 2022-08-27 MED ORDER — METHYLPREDNISOLONE SODIUM SUCC 125 MG IJ SOLR
125.0000 mg | Freq: Once | INTRAMUSCULAR | Status: AC
  Administered 2022-08-27: 125 mg via INTRAVENOUS
  Filled 2022-08-27: qty 2

## 2022-08-27 NOTE — Patient Instructions (Signed)
[facility] [place]  Discharge Instructions: Thank you for choosing [facility] to provide your oncology and hematology care.  If you have a lab appointment with the Cancer Center - please note that after April 8th, 2024, all labs will be drawn in the cancer center.  You do not have to check in or register with the main entrance as you have in the past but will complete your check-in in the cancer center.  Wear comfortable clothing and clothing appropriate for easy access to any Portacath or PICC line.   We strive to give you quality time with your provider. You may need to reschedule your appointment if you arrive late (15 or more minutes).  Arriving late affects you and other patients whose appointments are after yours.  Also, if you miss three or more appointments without notifying the office, you may be dismissed from the clinic at the provider's discretion.      For prescription refill requests, have your pharmacy contact our office and allow 72 hours for refills to be completed.    Today you received the following chemotherapy and/or immunotherapy agents Feraheme      To help prevent nausea and vomiting after your treatment, we encourage you to take your nausea medication as directed.  BELOW ARE SYMPTOMS THAT SHOULD BE REPORTED IMMEDIATELY: *FEVER GREATER THAN 100.4 F (38 C) OR HIGHER *CHILLS OR SWEATING *NAUSEA AND VOMITING THAT IS NOT CONTROLLED WITH YOUR NAUSEA MEDICATION *UNUSUAL SHORTNESS OF BREATH *UNUSUAL BRUISING OR BLEEDING *URINARY PROBLEMS (pain or burning when urinating, or frequent urination) *BOWEL PROBLEMS (unusual diarrhea, constipation, pain near the anus) TENDERNESS IN MOUTH AND THROAT WITH OR WITHOUT PRESENCE OF ULCERS (sore throat, sores in mouth, or a toothache) UNUSUAL RASH, SWELLING OR PAIN  UNUSUAL VAGINAL DISCHARGE OR ITCHING   Items with * indicate a potential emergency and should be followed up as soon as possible or go to the  Emergency Department if any problems should occur.  Please show the CHEMOTHERAPY ALERT CARD or IMMUNOTHERAPY ALERT CARD at check-in to the Emergency Department and triage nurse.  Should you have questions after your visit or need to cancel or reschedule your appointment, please contact [facility] [place] [phone]  and follow the prompts.  Office hours are 8:00 a.m. to 4:30 p.m. Monday - Friday. Please note that voicemails left after 4:00 p.m. may not be returned until the following business day.  We are closed weekends and major holidays. You have access to a nurse at all times for urgent questions. Please call the main number to the clinic [phone] and follow the prompts.  For any non-urgent questions, you may also contact your provider using MyChart. We now offer e-Visits for anyone 18 and older to request care online for non-urgent symptoms. For details visit [email or web address].   Also download the MyChart app! Go to the app store, search "MyChart", open the app, select [place], and log in with your MyChart username and password.   

## 2022-08-27 NOTE — Progress Notes (Signed)
Patient presents today for Feraheme infusion per providers order.  Vital signs WNL.  Patient has no new complaints at this time.  Peripheral IV started and blood return noted pre and post infusion.    Stable during infusion without adverse affects.  Vital signs stable.  No complaints at this time.  Discharge from clinic ambulatory in stable condition.  Alert and oriented X 3.  Follow up with [facility] as scheduled.  

## 2022-08-31 ENCOUNTER — Encounter: Payer: Self-pay | Admitting: Pulmonary Disease

## 2022-08-31 ENCOUNTER — Ambulatory Visit: Payer: PPO | Admitting: Pulmonary Disease

## 2022-08-31 VITALS — BP 134/78 | HR 63 | Wt 157.0 lb

## 2022-08-31 DIAGNOSIS — R053 Chronic cough: Secondary | ICD-10-CM

## 2022-08-31 DIAGNOSIS — J411 Mucopurulent chronic bronchitis: Secondary | ICD-10-CM

## 2022-08-31 MED ORDER — FLUTICASONE-SALMETEROL 250-50 MCG/ACT IN AEPB: 1.0000 | INHALATION_SPRAY | Freq: Two times a day (BID) | RESPIRATORY_TRACT | 3 refills | Status: DC

## 2022-08-31 NOTE — Patient Instructions (Signed)
Continue using DuoNeb to be used up to 4 times a day as needed  [employer] Will place a prescription for Wixela to be used in place of Breo If it is as expensive, asked the pharmacy to help you check what can be prescribed and let us know  Graded activities as tolerated  Continue to use oxygen at night  Call with significant concerns  Follow-up in 3 months

## 2022-08-31 NOTE — Progress Notes (Signed)
Chelsea [name]    [id number]    09-12-1945  Primary Care Physician:Morrison, Rachel Bo, FNP  Referring Physician: Trisha Mangle, FNP [address],  [place] [address]  Chief complaint:   Patient being seen for chronic respiratory failure, chronic obstructive pulmonary disease, concern for obstructive sleep apnea Accompanied by her 2 daughters  HPI:  Patient with COPD Recently quit smoking, was smoking about a pack a day quit smoking in 2020 Was told about COPD and started on Breo, nebulization treatments she does use nebulization treatments daily , Has been using Breo daily as well but finds Breo expensive.  Snoring, witnessed breath-hold by daughters had a sleep study performed in 2022 that was negative for significant sleep apnea-had a WatchPAT.  She feels rested in the morning Uses oxygen supplementation at night Sleeps with her mouth open sometimes Multiple awakenings at night to use the bathroom  Was recently hospitalized and went to her nursing facility for rehab Accidental fall breaking her hip  History of hypertension, heart failure, diabetes, hypercholesterolemia, prior CVA, chronic kidney disease  She has a regular cough, sometimes productive of green phlegm but clear at present Multiple antibiotics in the past Uses a flutter daily  Ambulates with a walker, she does have some blisters on her feet but are still healing, this limits her activities  Outpatient Encounter Medications as of 08/31/2022  Medication Sig   acetaminophen (TYLENOL) 325 MG tablet Take 1-2 tablets (325-650 mg total) by mouth every 6 (six) hours as needed for mild pain (pain score 1-3 or temp > 100.5).   albuterol (VENTOLIN HFA) 108 (90 Base) MCG/ACT inhaler Inhale 2 puffs into the lungs every 4 (four) hours as needed.   aspirin 81 MG chewable tablet Chew 1 tablet (81 mg total) by mouth daily.   carvedilol (COREG) 3.125 MG tablet TAKE ONE TABLET TWICE DAILY WITH  MEAL(S)   clopidogrel (PLAVIX) 75 MG tablet TAKE 1 TABLET ONCE DAILY   Docusate Sodium (STOOL SOFTENER) 100 MG capsule Take 1 tablet (100 mg total) by mouth 2 (two) times daily.   feeding supplement, GLUCERNA SHAKE, (GLUCERNA SHAKE) LIQD Take 237 mLs by mouth 3 (three) times daily between meals.   Ferrous Sulfate (IRON) 325 (65 Fe) MG TABS Take 325 mg by mouth every other day.   fluticasone furoate-vilanterol (BREO ELLIPTA) 100-25 MCG/ACT AEPB Inhale 1 puff into the lungs daily.   guaiFENesin (MUCINEX) 600 MG 12 hr tablet Take 1 tablet (600 mg total) by mouth 2 (two) times daily. (Patient taking differently: Take 600 mg by mouth 2 (two) times daily as needed.)   ipratropium-albuterol (DUONEB) 0.5-2.5 (3) MG/3ML SOLN Inhale into the lungs.   KERENDIA 10 MG TABS Take 1 tablet by mouth daily.   olmesartan (BENICAR) 20 MG tablet Take 10 mg by mouth daily.   Polyethyl Glycol-Propyl Glycol (SYSTANE FREE OP) Place 1 drop into both eyes daily.   polyethylene glycol (MIRALAX / GLYCOLAX) 17 g packet Take 17 g by mouth daily as needed for moderate constipation.   psyllium (HYDROCIL/METAMUCIL) 95 % PACK Take 1 packet by mouth daily.   rosuvastatin (CRESTOR) 5 MG tablet Take 1 tablet (5 mg total) by mouth daily.   tamsulosin (FLOMAX) 0.4 MG CAPS capsule Take 0.4 mg by mouth every evening.   torsemide (DEMADEX) 20 MG tablet Take 20 mg by mouth daily. Takes 40 mg if swelling   cholecalciferol (CHOLECALCIFEROL) 25 MCG tablet Take 2 tablets (2,000 Units  total) by mouth daily. (Patient not taking: Reported on 08/31/2022)   HYDROcodone-acetaminophen (NORCO/VICODIN) 5-325 MG tablet Take 1 tablet by mouth every 6 (six) hours as needed for moderate pain or severe pain (pain score 4-6). (Patient not taking: Reported on 08/31/2022)   Multiple Vitamin (MULTIVITAMIN WITH MINERALS) TABS tablet Take 1 tablet by mouth daily. (Patient not taking: Reported on 08/31/2022)   senna-docusate (SENOKOT-S) 8.6-50 MG tablet Take 1 tablet  by mouth at bedtime as needed for mild constipation. (Patient not taking: Reported on 08/31/2022)   No facility-administered encounter medications on file as of 08/31/2022.    Allergies as of 08/31/2022 - Review Complete 08/31/2022  Allergen Reaction Noted   Codeine Anaphylaxis and Swelling 10/30/2019   Latex Anaphylaxis 10/07/2020   Penicillins Anaphylaxis and Swelling 10/30/2019   Sulfa antibiotics Anaphylaxis, Other (See Comments), and Swelling 10/30/2019   Benzodiazepines Hives 10/30/2019   Cephalosporins Other (See Comments) 11/01/2019   Lidocaine Hives 10/30/2019   Oxycodone-acetaminophen Palpitations 11/01/2019   Trazodone Other (See Comments) 11/01/2019   Atorvastatin Nausea And Vomiting 05/22/2020   Cephalexin Itching 11/01/2019   Gabapentin Nausea And Vomiting 11/07/2019   Pregabalin Nausea And Vomiting 05/22/2020    Past Medical History:  Diagnosis Date   Allergy    Anemia    Blind left eye    Cardiac arrest [facility])    Carotid artery occlusion    Cataract    Depression    Diabetes mellitus without complication (HCC)    Hyperlipidemia    Hypertension    Oxygen deficiency    Stroke [facility])     Past Surgical History:  Procedure Laterality Date   ABDOMINAL HYSTERECTOMY     ENDARTERECTOMY Left 12/03/2019   Procedure: LEFT CAROTID ENDARTERECTOMY;  Surgeon: Chuck Hint, MD;  Location: [facility] OR;  Service: Vascular;  Laterality: Left;   INTRAMEDULLARY (IM) NAIL INTERTROCHANTERIC Right 06/14/2022   Procedure: INTRAMEDULLARY (IM) NAIL INTERTROCHANTERIC, REPAIR OF RIGHT HIP FRACTURE;  Surgeon: Myrene Galas, MD;  Location: [facility] OR;  Service: Orthopedics;  Laterality: Right;   PATCH ANGIOPLASTY Left 12/03/2019   Procedure: PATCH ANGIOPLASTY Left Carotid Artery;  Surgeon: Chuck Hint, MD;  Location: [facility] OR;  Service: Vascular;  Laterality: Left;    Family History  Problem Relation Age of Onset   Hypertension Mother    Hyperlipidemia Mother    Diabetes  Mother    Cancer Mother    Arthritis Mother    Heart attack Mother    Hypertension Father    Hyperlipidemia Father    Diabetes Father    Arthritis Father    Stroke Father    Hypertension Sister    Heart disease Sister    Heart attack Sister    Diabetes Sister    Depression Sister    COPD Sister    Arthritis Sister    Stroke Brother    Hypertension Brother    Heart disease Brother    Diabetes Brother    Depression Brother    Arthritis Brother    Stroke Daughter    Hypertension Daughter    Diabetes Daughter    Depression Daughter    Arthritis Daughter    Diabetes Son    Depression Son     Social History   Socioeconomic History   Marital status: Divorced    Spouse name: Not on file   Number of children: Not on file   Years of education: Not on file   Highest education level: Not on file  Occupational History  Not on file  Tobacco Use   Smoking status: Former    Types: Cigarettes   Smokeless tobacco: Never  Vaping Use   Vaping status: Never Used  Substance and Sexual Activity   Alcohol use: Not Currently   Drug use: Never   Sexual activity: Not Currently  Other Topics Concern   Not on file  Social History Narrative   Lives with daughter, [name]   Right Handed   Drinks no caffeine   Social Determinants of Health   Financial Resource Strain: Not on file  Food Insecurity: Low Risk  (08/30/2022)   Received from [facility]   Food vital sign    Within the past 12 months, you worried that your food would run out before you got money to buy more: Never true    Within the past 12 months, the food you bought just didn't last and you didn't have money to get more. : Never true  Transportation Needs: Not on file (08/30/2022)  Physical Activity: Not on file  Stress: Not on file  Social Connections: Unknown (05/24/2021)   Received from [facility], [facility]   Social Network    Social Network: Not on file  Intimate Partner Violence: Not At Risk  (06/13/2022)   Humiliation, Afraid, Rape, and Kick questionnaire    Fear of Current or Ex-Partner: No    Emotionally Abused: No    Physically Abused: No    Sexually Abused: No    Review of Systems  Constitutional:  Positive for fatigue.  Respiratory:  Positive for cough and shortness of breath.   Psychiatric/Behavioral:  Positive for sleep disturbance.     Vitals:   08/31/22 1310  Pulse: 63  SpO2: 97%     Physical Exam Constitutional:      Appearance: Normal appearance.  HENT:     Head: Normocephalic.     Nose: Nose normal.     Mouth/Throat:     Mouth: Mucous membranes are moist.  Eyes:     General: No scleral icterus. Cardiovascular:     Rate and Rhythm: Normal rate and regular rhythm.     Heart sounds: No murmur heard.    Friction rub present.  Pulmonary:     Effort: No respiratory distress.     Breath sounds: No stridor. No wheezing.     Comments: Decreased air movement at the bases Musculoskeletal:     Cervical back: No rigidity or tenderness.  Neurological:     Mental Status: She is alert.  Psychiatric:        Mood and Affect: Mood normal.    Data Reviewed: No PFT on record  Assessment:  Chronic obstructive pulmonary disease  Concern for sleep apnea,  Nocturnal desaturate or on oxygen supplementation at night  Deconditioning  Inhaler technique was reviewed and taught in the office today  Chronic kidney disease  Type 2 diabetes  History of CVA  Peripheral vascular disease  Plan/Recommendations: Continue bronchodilator treatments  Place a prescription for Wixela in place of Breo to see if this is more affordable  Encourage graded activities as tolerated  Nebulizer use as needed  Encouraged to call with significant concerns  Follow-up in 3 months  I spent 45 minutes dedicated to the care of this patient on the date of this encounter to include previsit review of records, face-to-face time with the patient discussing conditions above,  post visit ordering of testing, clinical documentation with electronic health record and communicated necessary findings to members of the  patient's care team   Virl Diamond MD [facility] 08/31/2022, 1:12 PM  CC: Trisha Mangle, *

## 2022-09-16 ENCOUNTER — Other Ambulatory Visit: Payer: Self-pay

## 2022-09-16 DIAGNOSIS — D509 Iron deficiency anemia, unspecified: Secondary | ICD-10-CM

## 2022-09-17 ENCOUNTER — Inpatient Hospital Stay: Payer: PPO | Attending: Oncology

## 2022-09-17 DIAGNOSIS — D509 Iron deficiency anemia, unspecified: Secondary | ICD-10-CM | POA: Insufficient documentation

## 2022-09-17 LAB — IRON AND TIBC
Iron: 104 ug/dL (ref 28–170)
Saturation Ratios: 39 % — ABNORMAL HIGH (ref 10.4–31.8)
TIBC: 264 ug/dL (ref 250–450)
UIBC: 160 ug/dL

## 2022-09-17 LAB — CBC WITH DIFFERENTIAL/PLATELET
Abs Immature Granulocytes: 0.02 10*3/uL (ref 0.00–0.07)
Basophils Absolute: 0 10*3/uL (ref 0.0–0.1)
Basophils Relative: 0 %
Eosinophils Absolute: 0.3 10*3/uL (ref 0.0–0.5)
Eosinophils Relative: 3 %
HCT: 32.3 % — ABNORMAL LOW (ref 36.0–46.0)
Hemoglobin: 10.3 g/dL — ABNORMAL LOW (ref 12.0–15.0)
Immature Granulocytes: 0 %
Lymphocytes Relative: 30 %
Lymphs Abs: 2.4 10*3/uL (ref 0.7–4.0)
MCH: 31.7 pg (ref 26.0–34.0)
MCHC: 31.9 g/dL (ref 30.0–36.0)
MCV: 99.4 fL (ref 80.0–100.0)
Monocytes Absolute: 0.6 10*3/uL (ref 0.1–1.0)
Monocytes Relative: 7 %
Neutro Abs: 4.7 10*3/uL (ref 1.7–7.7)
Neutrophils Relative %: 60 %
Platelets: 297 10*3/uL (ref 150–400)
RBC: 3.25 MIL/uL — ABNORMAL LOW (ref 3.87–5.11)
RDW: 12.9 % (ref 11.5–15.5)
WBC: 8 10*3/uL (ref 4.0–10.5)
nRBC: 0 % (ref 0.0–0.2)

## 2022-09-17 LAB — COMPREHENSIVE METABOLIC PANEL
ALT: 12 U/L (ref 0–44)
AST: 11 U/L — ABNORMAL LOW (ref 15–41)
Albumin: 4 g/dL (ref 3.5–5.0)
Alkaline Phosphatase: 125 U/L (ref 38–126)
Anion gap: 8 (ref 5–15)
BUN: 63 mg/dL — ABNORMAL HIGH (ref 8–23)
CO2: 25 mmol/L (ref 22–32)
Calcium: 9.1 mg/dL (ref 8.9–10.3)
Chloride: 105 mmol/L (ref 98–111)
Creatinine, Ser: 2.02 mg/dL — ABNORMAL HIGH (ref 0.44–1.00)
GFR, Estimated: 25 mL/min — ABNORMAL LOW (ref >60)
Glucose, Bld: 118 mg/dL — ABNORMAL HIGH (ref 70–99)
Potassium: 4.9 mmol/L (ref 3.5–5.1)
Sodium: 138 mmol/L (ref 135–145)
Total Bilirubin: 0.6 mg/dL (ref 0.3–1.2)
Total Protein: 7.1 g/dL (ref 6.5–8.1)

## 2022-09-17 LAB — FERRITIN: Ferritin: 504 ng/mL — ABNORMAL HIGH (ref 11–307)

## 2022-09-17 LAB — VITAMIN B12: Vitamin B-12: 843 pg/mL (ref 180–914)

## 2022-09-21 LAB — METHYLMALONIC ACID, SERUM: Methylmalonic Acid, Quantitative: 250 nmol/L (ref 0–378)

## 2022-09-23 ENCOUNTER — Ambulatory Visit: Payer: PPO | Admitting: Physician Assistant

## 2022-09-24 ENCOUNTER — Inpatient Hospital Stay: Payer: PPO

## 2022-09-24 ENCOUNTER — Inpatient Hospital Stay: Payer: PPO | Admitting: Oncology

## 2022-09-24 VITALS — BP 144/64 | HR 61 | Temp 97.8°F | Resp 19

## 2022-09-24 DIAGNOSIS — N184 Chronic kidney disease, stage 4 (severe): Secondary | ICD-10-CM | POA: Diagnosis not present

## 2022-09-24 DIAGNOSIS — D509 Iron deficiency anemia, unspecified: Secondary | ICD-10-CM

## 2022-09-24 MED ORDER — CYANOCOBALAMIN 1000 MCG/ML IJ SOLN: 1000.0000 ug | Freq: Once | INTRAMUSCULAR | Status: DC

## 2022-09-24 NOTE — Progress Notes (Signed)
[facility] [address] [place] [address]   Clinic Day:  07/29/2022  Referring physician: Trisha Mangle, *  Patient Care Team: Trisha Mangle, FNP as PCP - General (Family Medicine) Trisha Mangle, FNP as PCP - Family Medicine (Family Medicine) Jodelle Red, MD as PCP - Cardiology (Cardiology)   ASSESSMENT & PLAN:   Assessment:  1.  Normocytic anemia: - CBC (07/28/2022): Hb-9.2, MCV-95.  Elevated creatinine since 2021. - On iron tablet daily for last 4 years.  Denies BRBPR/melena. - Never had colonoscopy.  Stool was reportedly negative for occult blood when checked recently. - No prior transfusion.  No prior IV iron.  Denies ice pica.  2.  Social/family history: - Her daughter lives with her at home.  She has been on oxygen via nasal cannula for the last 1 month since her discharge from the hospital.  She is currently using oxygen mostly at nighttime.  She worked in [employer].  Denies exposure to asbestos.  Quit smoking 4 years ago when she had CVA.  Smoked 1 pack/day starting around age 49. - Mother had uterine cancer.  Plan:  1.  Normocytic anemia: - Etiology likely secondary to CKD and relative iron deficiency anemia. - History of CKD since October 2021 and anemia since November 2021. -Hemoglobin has ranged from 7.4-11.7 and creatinine has ranged from normal to 2.8. -Previous hematology workup from 07/29/2022 did not reveal evidence of hemolysis.  No evidence of bone marrow infiltrative process with normal immunofixation free light chains and protein electrophoresis.  No evidence of M spike.  Kappa lambda free light chains are both slightly elevated secondary to CKD.  Kappa lambda light chain ratio within normal limits. -Iron levels showed a ferritin of 172 with an iron saturation of 14%.  She was given 2 doses of IV Feraheme on 08/20/2022 and 08/27/2022. -Labs from 09/17/2022 showed creatinine of 2.02 and GFR of 25.   Hemoglobin 10.3 with MCV 99.4.  Differential is normal.  Iron saturations improved from 14 to 39%.  Ferritin is 504. -No additional IV iron needed at this time. -No indication to initiate Retacrit at this time. -Recommend follow-up in 3 months with labs a few days before.  2.  B12 deficiency -She received 1 B12 injection on 08/20/2022. -Start taking B12 supplements 1000 mcg daily.  -B12 level today is 843 (553) and MMA is 250 (635). -Will recheck in 3 months.  3. Right Hip fracture: -Hospitalized from 06/12/2022-06/25/22 for right mechanical fall and right hip fracture requiring surgery. -She was sent to skilled nursing facility for rehab for about 10 days.  -She continues to recover but is doing well.   PLAN SUMMARY: >> No additional IV iron needed at this time. >> Recommend she continue oral iron (325 mg every other day) and B12 supplements (1000 mcg daily) >> RTC in 3 months for f/u and labs a few days before.      No orders of the defined types were placed in this encounter.  I spent 25 minutes dedicated to the care of this patient (face-to-face and non-face-to-face) on the date of the encounter to include what is described in the assessment and plan.   Mauro Kaufmann, NP   9/13/20241:41 PM  CHIEF COMPLAINT/PURPOSE OF CONSULT:   Diagnosis: anemia  Current Therapy: Under workup  HISTORY OF PRESENT ILLNESS:   Chelsea [name] is a 77 y.o. female presenting to clinic today for follow-up for anemia.    She received 2  doses of IV Feraheme on 08/20/2022 and 08/27/2022.  She received 1 B12 injection on 08/20/2022.  Reports she did not notice any significant change in her energy levels post IV iron.  Since she returned home from the skilled nursing facility following her fall back in June she feels like her mobility has plateaued.  She developed a blister on the back of her left heel secondary to hospital bed that was brought to her home.  This is since been switched with a regular bed.  She  was followed by the foot doctor for the blister which is now slowly improving.  She uses a walker for ambulation.  She has follow-up with the foot doctor in 2 weeks.  Reports intermittent constipation for which she takes MiraLAX and a stool softener at bedtime.  She is currently not taking iron or B12 supplements.  Has history of anxiety and depression. Denies any melena, hematochezia, nosebleeds, gingival bleeding.  Has occasional shortness of breath and uses 2 L of oxygen at bedtime.    Today, she states that she is doing well overall. Her appetite level is at 60%. Her energy level is at 25%.  PAST MEDICAL HISTORY:   Past Medical History: Past Medical History:  Diagnosis Date   Allergy    Anemia    Blind left eye    Cardiac arrest (HCC)    Carotid artery occlusion    Cataract    Depression    Diabetes mellitus without complication (HCC)    Hyperlipidemia    Hypertension    Oxygen deficiency    Stroke [facility])     Surgical History: Past Surgical History:  Procedure Laterality Date   ABDOMINAL HYSTERECTOMY     ENDARTERECTOMY Left 12/03/2019   Procedure: LEFT CAROTID ENDARTERECTOMY;  Surgeon: Chuck Hint, MD;  Location: [facility] OR;  Service: Vascular;  Laterality: Left;   INTRAMEDULLARY (IM) NAIL INTERTROCHANTERIC Right 06/14/2022   Procedure: INTRAMEDULLARY (IM) NAIL INTERTROCHANTERIC, REPAIR OF RIGHT HIP FRACTURE;  Surgeon: Myrene Galas, MD;  Location: [facility] OR;  Service: Orthopedics;  Laterality: Right;   PATCH ANGIOPLASTY Left 12/03/2019   Procedure: PATCH ANGIOPLASTY Left Carotid Artery;  Surgeon: Chuck Hint, MD;  Location: [facility] OR;  Service: Vascular;  Laterality: Left;    Social History: Social History   Socioeconomic History   Marital status: Divorced    Spouse name: Not on file   Number of children: Not on file   Years of education: Not on file   Highest education level: Not on file  Occupational History   Not on file  Tobacco Use   Smoking status:  Former    Types: Cigarettes   Smokeless tobacco: Never  Vaping Use   Vaping status: Never Used  Substance and Sexual Activity   Alcohol use: Not Currently   Drug use: Never   Sexual activity: Not Currently  Other Topics Concern   Not on file  Social History Narrative   Lives with daughter, [name]   Right Handed   Drinks no caffeine   Social Determinants of Health   Financial Resource Strain: Not on file  Food Insecurity: Low Risk  (08/30/2022)   Received from [facility]   Hunger Vital Sign    Worried About Running Out of Food in the Last Year: Never true    Ran Out of Food in the Last Year: Never true  Transportation Needs: Not on file (08/30/2022)  Physical Activity: Not on file  Stress: Not on file  Social Connections: Unknown (  05/24/2021)   [facility]   Social Network    Social Network: Not on file  Intimate Partner Violence: Not At Risk (06/13/2022)   Humiliation, Afraid, Rape, and Kick questionnaire    Fear of Current or Ex-Partner: No    Emotionally Abused: No    Physically Abused: No    Sexually Abused: No    Family History: Family History  Problem Relation Age of Onset   Hypertension Mother    Hyperlipidemia Mother    Diabetes Mother    Cancer Mother    Arthritis Mother    Heart attack Mother    Hypertension Father    Hyperlipidemia Father    Diabetes Father    Arthritis Father    Stroke Father    Hypertension Sister    Heart disease Sister    Heart attack Sister    Diabetes Sister    Depression Sister    COPD Sister    Arthritis Sister    Stroke Brother    Hypertension Brother    Heart disease Brother    Diabetes Brother    Depression Brother    Arthritis Brother    Stroke Daughter    Hypertension Daughter    Diabetes Daughter    Depression Daughter    Arthritis Daughter    Diabetes Son    Depression Son     Current Medications:  Current Outpatient Medications:    acetaminophen (TYLENOL) 325 MG  tablet, Take 1-2 tablets (325-650 mg total) by mouth every 6 (six) hours as needed for mild pain (pain score 1-3 or temp > 100.5)., Disp: 60 tablet, Rfl: 0   albuterol (VENTOLIN HFA) 108 (90 Base) MCG/ACT inhaler, Inhale 2 puffs into the lungs every 4 (four) hours as needed., Disp: , Rfl:    aspirin 81 MG chewable tablet, Chew 1 tablet (81 mg total) by mouth daily., Disp: , Rfl:    carvedilol (COREG) 3.125 MG tablet, TAKE ONE TABLET TWICE DAILY WITH MEAL(S), Disp: 180 tablet, Rfl: 2   cholecalciferol (CHOLECALCIFEROL) 25 MCG tablet, Take 2 tablets (2,000 Units total) by mouth daily., Disp: , Rfl:    clopidogrel (PLAVIX) 75 MG tablet, TAKE 1 TABLET ONCE DAILY, Disp: 90 tablet, Rfl: 3   Docusate Sodium (STOOL SOFTENER) 100 MG capsule, Take 1 tablet (100 mg total) by mouth 2 (two) times daily., Disp: 10 tablet, Rfl: 0   feeding supplement, GLUCERNA SHAKE, (GLUCERNA SHAKE) LIQD, Take 237 mLs by mouth 3 (three) times daily between meals., Disp: , Rfl: 0   Ferrous Sulfate (IRON) 325 (65 Fe) MG TABS, Take 325 mg by mouth every other day., Disp: , Rfl:    fluticasone furoate-vilanterol (BREO ELLIPTA) 100-25 MCG/ACT AEPB, Inhale 1 puff into the lungs daily., Disp: , Rfl:    fluticasone-salmeterol (WIXELA INHUB) 250-50 MCG/ACT AEPB, Inhale 1 puff into the lungs in the morning and at bedtime., Disp: 60 each, Rfl: 3   guaiFENesin (MUCINEX) 600 MG 12 hr tablet, Take 1 tablet (600 mg total) by mouth 2 (two) times daily. (Patient taking differently: Take 600 mg by mouth 2 (two) times daily as needed.), Disp: 14 tablet, Rfl: 0   HYDROcodone-acetaminophen (NORCO/VICODIN) 5-325 MG tablet, Take 1 tablet by mouth every 6 (six) hours as needed for moderate pain or severe pain (pain score 4-6)., Disp: 30 tablet, Rfl: 0   ipratropium-albuterol (DUONEB) 0.5-2.5 (3) MG/3ML SOLN, Inhale into the lungs., Disp: , Rfl:    KERENDIA 10 MG TABS, Take 1 tablet by  mouth daily., Disp: , Rfl:    Multiple Vitamin (MULTIVITAMIN WITH  MINERALS) TABS tablet, Take 1 tablet by mouth daily., Disp: , Rfl:    olmesartan (BENICAR) 20 MG tablet, Take 10 mg by mouth daily., Disp: , Rfl:    Polyethyl Glycol-Propyl Glycol (SYSTANE FREE OP), Place 1 drop into both eyes daily., Disp: , Rfl:    polyethylene glycol (MIRALAX / GLYCOLAX) 17 g packet, Take 17 g by mouth daily as needed for moderate constipation., Disp: 14 each, Rfl: 0   psyllium (HYDROCIL/METAMUCIL) 95 % PACK, Take 1 packet by mouth daily., Disp: 240 each, Rfl: 0   rosuvastatin (CRESTOR) 5 MG tablet, Take 1 tablet (5 mg total) by mouth daily., Disp: 90 tablet, Rfl: 2   senna-docusate (SENOKOT-S) 8.6-50 MG tablet, Take 1 tablet by mouth at bedtime as needed for mild constipation., Disp: , Rfl:    tamsulosin (FLOMAX) 0.4 MG CAPS capsule, Take 0.4 mg by mouth every evening., Disp: , Rfl:    torsemide (DEMADEX) 20 MG tablet, Take 20 mg by mouth daily. Takes 40 mg if swelling, Disp: , Rfl:  No current facility-administered medications for this visit.  Facility-Administered Medications Ordered in Other Visits:    cyanocobalamin (VITAMIN B12) injection 1,000 mcg, 1,000 mcg, Intramuscular, Once, Ellinor Test, Renda Rolls, NP   Allergies: Allergies  Allergen Reactions   Codeine Anaphylaxis and Swelling    Throat swells     Latex Anaphylaxis   Penicillins Anaphylaxis and Swelling    Throat closes   Tolerated Cephalosporin Date: 06/14/22.     Sulfa Antibiotics Anaphylaxis, Other (See Comments) and Swelling    Lips and eye swell   Benzodiazepines Hives   Cephalosporins Other (See Comments)    Tolerated Cephalosporin Date: 06/14/22.     Lidocaine Hives   Oxycodone-Acetaminophen Palpitations    Other Reaction(s): Other (See Comments)  unknown   Trazodone Other (See Comments)    Sweating  Other Reaction(s): Other (See Comments)  unknown   Atorvastatin Nausea And Vomiting    Vomiting  Other Reaction(s): GI Intolerance  Vomiting    Cephalexin Itching    Tolerating  rocephin 06/25/20  Tolerated Cephalosporin Date: 06/14/22.    Other Reaction(s): Other (See Comments)  unknown   Gabapentin Nausea And Vomiting    Other Reaction(s): GI Intolerance   Pregabalin Nausea And Vomiting    Vomiting  Other Reaction(s): GI Intolerance  Vomiting     REVIEW OF SYSTEMS:   Review of Systems  HENT:   Positive for trouble swallowing.   Respiratory:  Positive for cough and shortness of breath.   Gastrointestinal:  Positive for constipation.  Neurological:  Positive for dizziness.  Psychiatric/Behavioral:  Positive for depression. The patient is nervous/anxious.      VITALS:   Blood pressure (!) 141/67, pulse 61, temperature 97.8 F (36.6 C), resp. rate 19, SpO2 100%.  Wt Readings from Last 3 Encounters:  08/31/22 157 lb (71.2 kg)  07/29/22 136 lb 3.2 oz (61.8 kg)  06/24/22 159 lb 13.3 oz (72.5 kg)    There is no height or weight on file to calculate BMI.   PHYSICAL EXAM:   Physical Exam Constitutional:      Appearance: Normal appearance.  Cardiovascular:     Rate and Rhythm: Normal rate and regular rhythm.  Pulmonary:     Effort: Pulmonary effort is normal.     Breath sounds: Normal breath sounds.  Abdominal:     General: Bowel sounds are normal.     Palpations: Abdomen  is soft.  Musculoskeletal:        General: No swelling. Normal range of motion.  Neurological:     Mental Status: She is alert and oriented to person, place, and time. Mental status is at baseline.     LABS:      Latest Ref Rng & Units 09/17/2022   12:05 PM 06/25/2022    1:47 AM 06/24/2022    7:55 AM  CBC  WBC 4.0 - 10.5 K/uL 8.0  9.7  9.7   Hemoglobin 12.0 - 15.0 g/dL 78.2  7.8  8.0   Hematocrit 36.0 - 46.0 % 32.3  25.0  25.5   Platelets 150 - 400 K/uL 297  431  428       Latest Ref Rng & Units 09/17/2022   12:05 PM 06/25/2022    1:47 AM 06/24/2022    7:55 AM  CMP  Glucose 70 - 99 mg/dL 956  213  086   BUN 8 - 23 mg/dL 63  25  27   Creatinine 0.44 - 1.00  mg/dL 5.78  4.69  6.29   Sodium 135 - 145 mmol/L 138  139  137   Potassium 3.5 - 5.1 mmol/L 4.9  3.8  3.5   Chloride 98 - 111 mmol/L 105  106  104   CO2 22 - 32 mmol/L 25  26  26    Calcium 8.9 - 10.3 mg/dL 9.1  7.8  7.8   Total Protein 6.5 - 8.1 g/dL 7.1  4.7  4.8   Total Bilirubin 0.3 - 1.2 mg/dL 0.6  0.6  0.6   Alkaline Phos 38 - 126 U/L 125  120  94   AST 15 - 41 U/L 11  16  14    ALT 0 - 44 U/L 12  13  12       No results found for: "CEA1", "CEA" / No results found for: "CEA1", "CEA" No results found for: "PSA1" No results found for: "CAN199" No results found for: "CAN125"  Lab Results  Component Value Date   TOTALPROTELP 6.0 07/29/2022   TOTALPROTELP 5.9 (L) 07/29/2022   ALBUMINELP 3.7 07/29/2022   A1GS 0.2 07/29/2022   A2GS 0.6 07/29/2022   BETS 0.7 07/29/2022   GAMS 0.8 07/29/2022   MSPIKE Not Observed 07/29/2022   SPEI Comment 07/29/2022   Lab Results  Component Value Date   TIBC 264 09/17/2022   TIBC 248 (L) 07/29/2022   TIBC 265 10/09/2020   FERRITIN 504 (H) 09/17/2022   FERRITIN 172 07/29/2022   IRONPCTSAT 39 (H) 09/17/2022   IRONPCTSAT 14 07/29/2022   IRONPCTSAT 27 10/09/2020   Lab Results  Component Value Date   LDH 119 07/29/2022     STUDIES:   No results found.

## 2022-09-24 NOTE — Progress Notes (Signed)
No injection per provider.

## 2022-10-28 ENCOUNTER — Ambulatory Visit (INDEPENDENT_AMBULATORY_CARE_PROVIDER_SITE_OTHER): Payer: PPO | Admitting: Cardiology

## 2022-10-28 ENCOUNTER — Encounter (HOSPITAL_BASED_OUTPATIENT_CLINIC_OR_DEPARTMENT_OTHER): Payer: Self-pay | Admitting: Cardiology

## 2022-10-28 VITALS — BP 120/76 | HR 59 | Ht 67.0 in | Wt 157.9 lb

## 2022-10-28 DIAGNOSIS — I6522 Occlusion and stenosis of left carotid artery: Secondary | ICD-10-CM | POA: Diagnosis not present

## 2022-10-28 DIAGNOSIS — I10 Essential (primary) hypertension: Secondary | ICD-10-CM | POA: Diagnosis not present

## 2022-10-28 DIAGNOSIS — Z8673 Personal history of transient ischemic attack (TIA), and cerebral infarction without residual deficits: Secondary | ICD-10-CM | POA: Diagnosis not present

## 2022-10-28 DIAGNOSIS — E7800 Pure hypercholesterolemia, unspecified: Secondary | ICD-10-CM | POA: Diagnosis not present

## 2022-10-28 DIAGNOSIS — R0989 Other specified symptoms and signs involving the circulatory and respiratory systems: Secondary | ICD-10-CM

## 2022-10-28 NOTE — Progress Notes (Signed)
Cardiology Office Note:  .    Date:  10/28/2022  ID:  Chelsea [name], DOB September 22, 1945, MRN [id number] PCP: Trisha Mangle, FNP  [facility] Providers Cardiologist:  Jodelle Red, MD     History of Present Illness: .    Chelsea [name] is a 77 y.o. female with a hx of CVA, cardiac arrest, carotid artery occlusion, diabetes mellitus without complication, hypertension, and stroke, who is seen for follow up today. I initially met her 06/18/20 as a new consult at the request of Trisha Mangle, FNP for the evaluation and management of uncontrolled hypertension.   Note from Dr. Valentino Nose at [facility] from 03/10/21 reviewed. At that visit, olmesartan increased to 20 mg daily, amlodipine decreased to 5 mg daily, torsemide decreased to 40 mg daily due to LE edema. BP well controlled.   In 07/2021, she reported resolution of her lightheadedness. She had been able to do more activities independently since starting physical therapy. We ordered 2.5 mg tablets of amlodipine so she would no longer need to cut her pills.   At her visit 04/2022, she complained of shortness of breath attributable to a recent cold. No recurrent lightheadedness. Her blood pressure and swelling had been well controlled. She was no longer on amlodipine.  On 06/12/2022, she presented to the ER following a mechanical fall. Imaging confirmed right hip fracture. She was admitted and on 6/3 underwent repair of her hip fracture with right intramedullary nail.  Today, the patient presents with her daughter to help provide history. She is feeling okay at this time but notes that she had a post-surgical ileus. Occasionally feels soreness related to her hip.  At times she feels weak. Able to complete ADL's such as dressing herself. Washing herself leaves her exhausted. She states that her breathing is stable. She only uses her oxygen at night. Previously tried to stay off of her oxygen for 2 days and nights but  developed severe weakness.  She has multiple ecchymoses of her upper extremities. Currently on Plavix. She confirms that she is not taking 81 mg ASA.  In the office, her blood pressure is 158/72. She did not take her medications this morning. She did not want to take the torsemide as she was coming to this appointment today. She does report typically well controlled readings at home.  She does have some swelling in her legs. Her daughter states that she has noticed there her mother's right calf has appeared more erythematous than her left, especially while walking.   She denies any palpitations, chest pain, lightheadedness, headaches, syncope, orthopnea, or PND.  ROS:  Please see the history of present illness. ROS otherwise negative except as noted.  (+) Hip soreness (+) Generalized weakness (+) Easy bruising (+) Intermittent LE edema.  Studies Reviewed: .         Echo  06/13/2022:  1. Left ventricular ejection fraction, by estimation, is 70 to 75%. The  left ventricle has hyperdynamic function. The left ventricle has no  regional wall motion abnormalities. There is mild left ventricular  hypertrophy. Left ventricular diastolic  parameters are consistent with Grade II diastolic dysfunction  (pseudonormalization). Elevated left atrial pressure.   2. Right ventricular systolic function was not well visualized. The right  ventricular size is not well visualized.   3. Right atrial size was mildly dilated.   4. The mitral valve is degenerative. Trivial mitral valve regurgitation.  Moderate mitral annular calcification.   5. The aortic valve is tricuspid.  Aortic valve regurgitation is not  visualized. Aortic valve sclerosis/calcification is present, without any  evidence of aortic stenosis.   Physical Exam:    VS:  BP 120/76 Comment: home  Pulse (!) 59   Ht 5\' 7"  (1.702 m)   Wt 157 lb 14.4 oz (71.6 kg)   SpO2 96%   BMI 24.73 kg/m    Wt Readings from Last 3 Encounters:  10/28/22  157 lb 14.4 oz (71.6 kg)  08/31/22 157 lb (71.2 kg)  07/29/22 136 lb 3.2 oz (61.8 kg)    GEN: Well nourished, well developed in no acute distress HEENT: Normal, moist mucous membranes NECK: No JVD CARDIAC: regular rhythm, normal S1 and S2, no rubs or gallops. No murmur. VASCULAR: Radial and DP pulses 2+ bilaterally. No carotid bruits RESPIRATORY:  Clear to auscultation without rales, wheezing or rhonchi  ABDOMEN: Soft, non-tender, non-distended MUSCULOSKELETAL:  Ambulates independently SKIN: Warm and dry, trivial LE edema bilaterally. Multiple ecchymoses of upper extremities. NEUROLOGIC:  Alert and oriented x 3. No focal neuro deficits noted. PSYCHIATRIC:  Normal affect   ASSESSMENT AND PLAN: .    Lightheadedness/dizziness:  -resolved   History of CVA Carotid stenosis, left Hypercholesterolemia -continue clopidogrel, would stop aspirin given ecchymoses and no new indication for DAPT, has tolerated clopidogrel for a long time -last LDL 55, goal <70 -continue rosuvastatin 5 mg daily   Hypertension, labile -has a history of both very low and very high pressures, need to be cautious with med changes -no longer on amlodipine. Hydralazine stopped previously. -continue olmesartan, carvedilol, torsemide, kerendia   Type II diabetes Chronic kidney disease, stage 3b Former smoker -no edema on torsemide/kerendia -With frequent UTIs, SGLT2i not ideal. Would not use GLP1RA, as she had issues with lack of appetite/weight loss   Cardiac risk counseling and prevention recommendations: -recommend heart healthy/Mediterranean diet, with whole grains, fruits, vegetable, fish, lean meats, nuts, and olive oil. Limit salt. -limited mobility, cannot walk for significant distance -recommend avoidance of tobacco products. Avoid excess alcohol.  Dispo: Follow-up in 6 months, or sooner as needed.  I,Mathew Stumpf,acting as a Neurosurgeon for [employer], MD.,have documented all relevant  documentation on the behalf of Jodelle Red, MD,as directed by  Jodelle Red, MD while in the presence of Jodelle Red, MD.  I, Jodelle Red, MD, have reviewed all documentation for this visit. The documentation on 10/28/22 for the exam, diagnosis, procedures, and orders are all accurate and complete.   Signed, Jodelle Red, MD

## 2022-10-28 NOTE — Patient Instructions (Signed)
Medication Instructions:  Your physician recommends that you continue on your current medications as directed. Please refer to the Current Medication list given to you today.   Follow-Up: At [facility], you and your health needs are our priority.  As part of our continuing mission to provide you with exceptional heart care, we have created designated Provider Care Teams.  These Care Teams include your primary Cardiologist (physician) and Advanced Practice Providers (APPs -  Physician Assistants and Nurse Practitioners) who all work together to provide you with the care you need, when you need it.  We recommend signing up for the patient portal called "MyChart".  Sign up information is provided on this After Visit Summary.  MyChart is used to connect with patients for Virtual Visits (Telemedicine).  Patients are able to view lab/test results, encounter notes, upcoming appointments, etc.  Non-urgent messages can be sent to your provider as well.   To learn more about what you can do with MyChart, go to [email or web address].    Your next appointment:   6 months with Dr. Cristal Deer

## 2022-12-02 ENCOUNTER — Ambulatory Visit: Payer: PPO | Admitting: Pulmonary Disease

## 2022-12-02 ENCOUNTER — Encounter: Payer: Self-pay | Admitting: Pulmonary Disease

## 2022-12-02 VITALS — BP 150/82 | HR 65 | Temp 97.7°F | Ht 67.0 in | Wt 153.5 lb

## 2022-12-02 DIAGNOSIS — E119 Type 2 diabetes mellitus without complications: Secondary | ICD-10-CM

## 2022-12-02 DIAGNOSIS — J449 Chronic obstructive pulmonary disease, unspecified: Secondary | ICD-10-CM

## 2022-12-02 DIAGNOSIS — R053 Chronic cough: Secondary | ICD-10-CM

## 2022-12-02 NOTE — Progress Notes (Signed)
Chelsea [name]    [id number]    July 05, 1945  Primary Care Physician:Morrison, Rachel Bo, FNP  Referring Physician: Trisha Mangle, FNP [address]  [place] [address]  Chief complaint:   Patient being seen for chronic respiratory failure, chronic obstructive pulmonary disease, concern for obstructive sleep apnea In for follow-up today  HPI:  Patient with COPD  Has been doing relatively well with Wixela, uses a nebulizer twice a day  Has been doing very well with no significant concerns at present Occasional cough Was using Mucinex regularly but has not been coughing much  Compliant with inhaler  She does try to do her chores, Gets short of breath with significant exertion  Snoring, witnessed breath-hold by daughters had a sleep study performed in 2022 that was negative for significant sleep apnea-had a WatchPAT.  She feels rested in the morning Uses oxygen supplementation at night Sleeps with her mouth open sometimes Multiple awakenings at night to use the bathroom  Was recently hospitalized and went to her nursing facility for rehab Accidental fall breaking her hip  History of hypertension, heart failure, diabetes, hypercholesterolemia, prior CVA, chronic kidney disease  Uses a flutter daily, not coughing much  Ambulates with a walker, she does have some blisters on her feet but are still healing, this limits her activities  Outpatient Encounter Medications as of 12/02/2022  Medication Sig   acetaminophen (TYLENOL) 325 MG tablet Take 1-2 tablets (325-650 mg total) by mouth every 6 (six) hours as needed for mild pain (pain score 1-3 or temp > 100.5).   albuterol (VENTOLIN HFA) 108 (90 Base) MCG/ACT inhaler Inhale 2 puffs into the lungs every 4 (four) hours as needed.   carvedilol (COREG) 3.125 MG tablet TAKE ONE TABLET TWICE DAILY WITH MEAL(S)   cholecalciferol (CHOLECALCIFEROL) 25 MCG tablet Take 2 tablets (2,000 Units total) by mouth  daily.   clopidogrel (PLAVIX) 75 MG tablet TAKE 1 TABLET ONCE DAILY   Docusate Sodium (STOOL SOFTENER) 100 MG capsule Take 1 tablet (100 mg total) by mouth 2 (two) times daily.   feeding supplement, GLUCERNA SHAKE, (GLUCERNA SHAKE) LIQD Take 237 mLs by mouth 3 (three) times daily between meals.   fluticasone furoate-vilanterol (BREO ELLIPTA) 100-25 MCG/ACT AEPB Inhale 1 puff into the lungs daily.   fluticasone-salmeterol (WIXELA INHUB) 250-50 MCG/ACT AEPB Inhale 1 puff into the lungs in the morning and at bedtime.   guaiFENesin (MUCINEX) 600 MG 12 hr tablet Take 1 tablet (600 mg total) by mouth 2 (two) times daily. (Patient taking differently: Take 600 mg by mouth 2 (two) times daily as needed.)   ipratropium-albuterol (DUONEB) 0.5-2.5 (3) MG/3ML SOLN Inhale into the lungs.   KERENDIA 10 MG TABS Take 1 tablet by mouth daily.   olmesartan (BENICAR) 20 MG tablet Take 10 mg by mouth daily.   polyethylene glycol (MIRALAX / GLYCOLAX) 17 g packet Take 17 g by mouth daily as needed for moderate constipation.   rosuvastatin (CRESTOR) 5 MG tablet Take 1 tablet (5 mg total) by mouth daily.   tamsulosin (FLOMAX) 0.4 MG CAPS capsule Take 0.4 mg by mouth every evening.   torsemide (DEMADEX) 20 MG tablet Take 20 mg by mouth daily. Takes 40 mg if swelling   [DISCONTINUED] Ferrous Sulfate (IRON) 325 (65 Fe) MG TABS Take 325 mg by mouth every other day. (Patient not taking: Reported on 12/02/2022)   No facility-administered encounter medications on file as of 12/02/2022.    Allergies  as of 12/02/2022 - Review Complete 12/02/2022  Allergen Reaction Noted   Codeine Anaphylaxis and Swelling 10/30/2019   Latex Anaphylaxis 10/07/2020   Penicillins Anaphylaxis and Swelling 10/30/2019   Sulfa antibiotics Anaphylaxis, Other (See Comments), and Swelling 10/30/2019   Benzodiazepines Hives 10/30/2019   Cephalosporins Other (See Comments) 11/01/2019   Lidocaine Hives 10/30/2019   Oxycodone-acetaminophen Palpitations  11/01/2019   Trazodone Other (See Comments) 11/01/2019   Atorvastatin Nausea And Vomiting 05/22/2020   Cephalexin Itching 11/01/2019   Gabapentin Nausea And Vomiting 11/07/2019   Pregabalin Nausea And Vomiting 05/22/2020    Past Medical History:  Diagnosis Date   Allergy    Anemia    Blind left eye    Cardiac arrest [facility])    Carotid artery occlusion    Cataract    Depression    Diabetes mellitus without complication (HCC)    Hyperlipidemia    Hypertension    Oxygen deficiency    Stroke [facility])     Past Surgical History:  Procedure Laterality Date   ABDOMINAL HYSTERECTOMY     ENDARTERECTOMY Left 12/03/2019   Procedure: LEFT CAROTID ENDARTERECTOMY;  Surgeon: Chuck Hint, MD;  Location: [facility] OR;  Service: Vascular;  Laterality: Left;   INTRAMEDULLARY (IM) NAIL INTERTROCHANTERIC Right 06/14/2022   Procedure: INTRAMEDULLARY (IM) NAIL INTERTROCHANTERIC, REPAIR OF RIGHT HIP FRACTURE;  Surgeon: Myrene Galas, MD;  Location: [facility] OR;  Service: Orthopedics;  Laterality: Right;   PATCH ANGIOPLASTY Left 12/03/2019   Procedure: PATCH ANGIOPLASTY Left Carotid Artery;  Surgeon: Chuck Hint, MD;  Location: [facility] OR;  Service: Vascular;  Laterality: Left;    Family History  Problem Relation Age of Onset   Hypertension Mother    Hyperlipidemia Mother    Diabetes Mother    Cancer Mother    Arthritis Mother    Heart attack Mother    Hypertension Father    Hyperlipidemia Father    Diabetes Father    Arthritis Father    Stroke Father    Hypertension Sister    Heart disease Sister    Heart attack Sister    Diabetes Sister    Depression Sister    COPD Sister    Arthritis Sister    Stroke Brother    Hypertension Brother    Heart disease Brother    Diabetes Brother    Depression Brother    Arthritis Brother    Stroke Daughter    Hypertension Daughter    Diabetes Daughter    Depression Daughter    Arthritis Daughter    Diabetes Son    Depression Son     Social  History   Socioeconomic History   Marital status: Divorced    Spouse name: Not on file   Number of children: Not on file   Years of education: Not on file   Highest education level: Not on file  Occupational History   Not on file  Tobacco Use   Smoking status: Former    Types: Cigarettes   Smokeless tobacco: Never  Vaping Use   Vaping status: Never Used  Substance and Sexual Activity   Alcohol use: Not Currently   Drug use: Never   Sexual activity: Not Currently  Other Topics Concern   Not on file  Social History Narrative   Lives with daughter, [name]   Right Handed   Drinks no caffeine   Social Determinants of Health   Financial Resource Strain: Not on file  Food Insecurity: Low Risk  (08/30/2022)   Received  from [facility]   Hunger Vital Sign    Worried About Running Out of Food in the Last Year: Never true    Ran Out of Food in the Last Year: Never true  Transportation Needs: Not on file (08/30/2022)  Physical Activity: Not on file  Stress: Not on file  Social Connections: Unknown (05/24/2021)   [facility]   Social Network    Social Network: Not on file  Intimate Partner Violence: Not At Risk (06/13/2022)   Humiliation, Afraid, Rape, and Kick questionnaire    Fear of Current or Ex-Partner: No    Emotionally Abused: No    Physically Abused: No    Sexually Abused: No    Review of Systems  Constitutional:  Negative for fatigue.  Respiratory:  Negative for cough and shortness of breath.   Psychiatric/Behavioral:  Positive for sleep disturbance.     Vitals:   12/02/22 1324  BP: (!) 150/82  Pulse: 65  Temp: 97.7 F (36.5 C)  SpO2: 99%     Physical Exam Constitutional:      Appearance: Normal appearance.  HENT:     Head: Normocephalic.     Nose: Nose normal.     Mouth/Throat:     Mouth: Mucous membranes are moist.  Eyes:     General: No scleral icterus. Cardiovascular:     Rate and Rhythm: Normal rate and regular  rhythm.     Heart sounds: No murmur heard.    Friction rub present.  Pulmonary:     Effort: No respiratory distress.     Breath sounds: No stridor. No wheezing.     Comments: Decreased air movement at the bases Musculoskeletal:     Cervical back: No rigidity or tenderness.  Neurological:     Mental Status: She is alert.  Psychiatric:        Mood and Affect: Mood normal.    Data Reviewed: No PFT on record  Assessment:  Chronic obstructive pulmonary disease  Nocturnal desaturate or, uses oxygen at night  Deconditioning  Compliant with Wixela Uses nebulization treatments twice a day  Type 2 diabetes  History of CVA  Peripheral vascular disease  Plan/Recommendations: Continue bronchodilator treatments  Encouraged to call with significant concerns  Follow-up in 6 months  Encouraged to stay active    Virl Diamond MD [facility] 12/02/2022, 1:30 PM  CC: Trisha Mangle, *

## 2022-12-02 NOTE — Patient Instructions (Addendum)
I will see you back in about 6 months  Continue using your nebulizer  Continue Wixela  Call us with any significant concerns

## 2022-12-17 ENCOUNTER — Inpatient Hospital Stay: Payer: PPO

## 2022-12-24 ENCOUNTER — Other Ambulatory Visit (HOSPITAL_BASED_OUTPATIENT_CLINIC_OR_DEPARTMENT_OTHER): Payer: Self-pay | Admitting: Cardiology

## 2022-12-24 ENCOUNTER — Inpatient Hospital Stay: Payer: PPO | Admitting: Oncology

## 2023-01-18 ENCOUNTER — Other Ambulatory Visit: Payer: Self-pay | Admitting: Pulmonary Disease

## 2023-01-19 NOTE — Telephone Encounter (Signed)
 Pt was last seen 12-02-22. The pt is requesting a refill on Advair, but this rx is not in her med list nor in her lov. Please advise.

## 2023-02-09 DIAGNOSIS — N1832 Chronic kidney disease, stage 3b: Secondary | ICD-10-CM | POA: Diagnosis not present

## 2023-02-14 ENCOUNTER — Telehealth: Payer: Self-pay | Admitting: Pulmonary Disease

## 2023-02-14 NOTE — Telephone Encounter (Signed)
Patient needs refill of Duo neb (12 refills)  Pharmacy: [facility]

## 2023-02-15 DIAGNOSIS — N1832 Chronic kidney disease, stage 3b: Secondary | ICD-10-CM | POA: Diagnosis not present

## 2023-02-15 DIAGNOSIS — E1122 Type 2 diabetes mellitus with diabetic chronic kidney disease: Secondary | ICD-10-CM | POA: Diagnosis not present

## 2023-02-15 DIAGNOSIS — E1169 Type 2 diabetes mellitus with other specified complication: Secondary | ICD-10-CM | POA: Diagnosis not present

## 2023-02-15 DIAGNOSIS — I739 Peripheral vascular disease, unspecified: Secondary | ICD-10-CM | POA: Diagnosis not present

## 2023-02-15 DIAGNOSIS — R600 Localized edema: Secondary | ICD-10-CM | POA: Diagnosis not present

## 2023-02-15 DIAGNOSIS — I129 Hypertensive chronic kidney disease with stage 1 through stage 4 chronic kidney disease, or unspecified chronic kidney disease: Secondary | ICD-10-CM | POA: Diagnosis not present

## 2023-02-15 DIAGNOSIS — J9611 Chronic respiratory failure with hypoxia: Secondary | ICD-10-CM | POA: Diagnosis not present

## 2023-02-15 DIAGNOSIS — J449 Chronic obstructive pulmonary disease, unspecified: Secondary | ICD-10-CM | POA: Diagnosis not present

## 2023-02-15 DIAGNOSIS — I639 Cerebral infarction, unspecified: Secondary | ICD-10-CM | POA: Diagnosis not present

## 2023-02-15 DIAGNOSIS — M6281 Muscle weakness (generalized): Secondary | ICD-10-CM | POA: Diagnosis not present

## 2023-02-15 NOTE — Telephone Encounter (Signed)
Lm for patient.   It does not appear that Duoneb was originally prescribed by our office.   AO, please advise on duoneb. Thanks

## 2023-02-16 ENCOUNTER — Encounter: Payer: Self-pay | Admitting: Pulmonary Disease

## 2023-02-16 ENCOUNTER — Other Ambulatory Visit: Payer: Self-pay | Admitting: Pulmonary Disease

## 2023-02-16 MED ORDER — IPRATROPIUM-ALBUTEROL 0.5-2.5 (3) MG/3ML IN SOLN: 3.0000 mL | RESPIRATORY_TRACT | 2 refills | Status: AC | PRN

## 2023-03-04 DIAGNOSIS — I639 Cerebral infarction, unspecified: Secondary | ICD-10-CM | POA: Diagnosis not present

## 2023-03-04 DIAGNOSIS — I129 Hypertensive chronic kidney disease with stage 1 through stage 4 chronic kidney disease, or unspecified chronic kidney disease: Secondary | ICD-10-CM | POA: Diagnosis not present

## 2023-03-04 DIAGNOSIS — N1832 Chronic kidney disease, stage 3b: Secondary | ICD-10-CM | POA: Diagnosis not present

## 2023-03-04 DIAGNOSIS — R600 Localized edema: Secondary | ICD-10-CM | POA: Diagnosis not present

## 2023-03-04 DIAGNOSIS — E1122 Type 2 diabetes mellitus with diabetic chronic kidney disease: Secondary | ICD-10-CM | POA: Diagnosis not present

## 2023-03-15 DIAGNOSIS — E1169 Type 2 diabetes mellitus with other specified complication: Secondary | ICD-10-CM | POA: Diagnosis not present

## 2023-03-15 DIAGNOSIS — J9611 Chronic respiratory failure with hypoxia: Secondary | ICD-10-CM | POA: Diagnosis not present

## 2023-03-15 DIAGNOSIS — J449 Chronic obstructive pulmonary disease, unspecified: Secondary | ICD-10-CM | POA: Diagnosis not present

## 2023-03-15 DIAGNOSIS — I739 Peripheral vascular disease, unspecified: Secondary | ICD-10-CM | POA: Diagnosis not present

## 2023-03-15 DIAGNOSIS — M6281 Muscle weakness (generalized): Secondary | ICD-10-CM | POA: Diagnosis not present

## 2023-03-22 ENCOUNTER — Other Ambulatory Visit: Payer: Self-pay | Admitting: Cardiovascular Disease

## 2023-03-22 DIAGNOSIS — M79676 Pain in unspecified toe(s): Secondary | ICD-10-CM | POA: Diagnosis not present

## 2023-03-22 DIAGNOSIS — B351 Tinea unguium: Secondary | ICD-10-CM | POA: Diagnosis not present

## 2023-03-22 DIAGNOSIS — I70203 Unspecified atherosclerosis of native arteries of extremities, bilateral legs: Secondary | ICD-10-CM | POA: Diagnosis not present

## 2023-03-22 DIAGNOSIS — L84 Corns and callosities: Secondary | ICD-10-CM | POA: Diagnosis not present

## 2023-03-30 DIAGNOSIS — N1832 Chronic kidney disease, stage 3b: Secondary | ICD-10-CM | POA: Diagnosis not present

## 2023-04-15 DIAGNOSIS — J449 Chronic obstructive pulmonary disease, unspecified: Secondary | ICD-10-CM | POA: Diagnosis not present

## 2023-04-15 DIAGNOSIS — I739 Peripheral vascular disease, unspecified: Secondary | ICD-10-CM | POA: Diagnosis not present

## 2023-04-15 DIAGNOSIS — J9611 Chronic respiratory failure with hypoxia: Secondary | ICD-10-CM | POA: Diagnosis not present

## 2023-04-15 DIAGNOSIS — E1169 Type 2 diabetes mellitus with other specified complication: Secondary | ICD-10-CM | POA: Diagnosis not present

## 2023-04-15 DIAGNOSIS — M6281 Muscle weakness (generalized): Secondary | ICD-10-CM | POA: Diagnosis not present

## 2023-04-22 DIAGNOSIS — R8290 Unspecified abnormal findings in urine: Secondary | ICD-10-CM | POA: Diagnosis not present

## 2023-04-25 DIAGNOSIS — Z87440 Personal history of urinary (tract) infections: Secondary | ICD-10-CM | POA: Diagnosis not present

## 2023-05-04 DIAGNOSIS — N390 Urinary tract infection, site not specified: Secondary | ICD-10-CM | POA: Diagnosis not present

## 2023-05-04 DIAGNOSIS — R600 Localized edema: Secondary | ICD-10-CM | POA: Diagnosis not present

## 2023-05-12 DIAGNOSIS — N1832 Chronic kidney disease, stage 3b: Secondary | ICD-10-CM | POA: Diagnosis not present

## 2023-05-12 DIAGNOSIS — N390 Urinary tract infection, site not specified: Secondary | ICD-10-CM | POA: Diagnosis not present

## 2023-05-15 DIAGNOSIS — I739 Peripheral vascular disease, unspecified: Secondary | ICD-10-CM | POA: Diagnosis not present

## 2023-05-15 DIAGNOSIS — J9611 Chronic respiratory failure with hypoxia: Secondary | ICD-10-CM | POA: Diagnosis not present

## 2023-05-15 DIAGNOSIS — M6281 Muscle weakness (generalized): Secondary | ICD-10-CM | POA: Diagnosis not present

## 2023-05-15 DIAGNOSIS — E1169 Type 2 diabetes mellitus with other specified complication: Secondary | ICD-10-CM | POA: Diagnosis not present

## 2023-05-15 DIAGNOSIS — J449 Chronic obstructive pulmonary disease, unspecified: Secondary | ICD-10-CM | POA: Diagnosis not present

## 2023-05-18 DIAGNOSIS — R600 Localized edema: Secondary | ICD-10-CM | POA: Diagnosis not present

## 2023-05-18 DIAGNOSIS — I129 Hypertensive chronic kidney disease with stage 1 through stage 4 chronic kidney disease, or unspecified chronic kidney disease: Secondary | ICD-10-CM | POA: Diagnosis not present

## 2023-05-18 DIAGNOSIS — E1122 Type 2 diabetes mellitus with diabetic chronic kidney disease: Secondary | ICD-10-CM | POA: Diagnosis not present

## 2023-05-18 DIAGNOSIS — N1832 Chronic kidney disease, stage 3b: Secondary | ICD-10-CM | POA: Diagnosis not present

## 2023-05-18 DIAGNOSIS — N390 Urinary tract infection, site not specified: Secondary | ICD-10-CM | POA: Diagnosis not present

## 2023-05-27 ENCOUNTER — Other Ambulatory Visit: Payer: Self-pay | Admitting: *Deleted

## 2023-05-27 DIAGNOSIS — I6523 Occlusion and stenosis of bilateral carotid arteries: Secondary | ICD-10-CM

## 2023-05-31 DIAGNOSIS — B351 Tinea unguium: Secondary | ICD-10-CM | POA: Diagnosis not present

## 2023-05-31 DIAGNOSIS — M79674 Pain in right toe(s): Secondary | ICD-10-CM | POA: Diagnosis not present

## 2023-05-31 DIAGNOSIS — I70203 Unspecified atherosclerosis of native arteries of extremities, bilateral legs: Secondary | ICD-10-CM | POA: Diagnosis not present

## 2023-05-31 DIAGNOSIS — L84 Corns and callosities: Secondary | ICD-10-CM | POA: Diagnosis not present

## 2023-05-31 DIAGNOSIS — M79675 Pain in left toe(s): Secondary | ICD-10-CM | POA: Diagnosis not present

## 2023-06-07 ENCOUNTER — Ambulatory Visit

## 2023-06-07 ENCOUNTER — Ambulatory Visit: Payer: PPO

## 2023-06-07 ENCOUNTER — Encounter

## 2023-06-09 ENCOUNTER — Inpatient Hospital Stay (HOSPITAL_COMMUNITY)
Admission: EM | Admit: 2023-06-09 | Discharge: 2023-06-14 | DRG: 563 | Disposition: A | Discharge status: Skilled Nursing Facility | Attending: Internal Medicine | Admitting: Internal Medicine

## 2023-06-09 ENCOUNTER — Encounter (HOSPITAL_COMMUNITY): Payer: Self-pay

## 2023-06-09 ENCOUNTER — Emergency Department (HOSPITAL_COMMUNITY)

## 2023-06-09 ENCOUNTER — Other Ambulatory Visit: Payer: Self-pay

## 2023-06-09 DIAGNOSIS — Z888 Allergy status to other drugs, medicaments and biological substances status: Secondary | ICD-10-CM

## 2023-06-09 DIAGNOSIS — K5900 Constipation, unspecified: Secondary | ICD-10-CM | POA: Diagnosis not present

## 2023-06-09 DIAGNOSIS — Z87440 Personal history of urinary (tract) infections: Secondary | ICD-10-CM

## 2023-06-09 DIAGNOSIS — D631 Anemia in chronic kidney disease: Secondary | ICD-10-CM | POA: Diagnosis present

## 2023-06-09 DIAGNOSIS — Z79899 Other long term (current) drug therapy: Secondary | ICD-10-CM

## 2023-06-09 DIAGNOSIS — N179 Acute kidney failure, unspecified: Secondary | ICD-10-CM | POA: Diagnosis present

## 2023-06-09 DIAGNOSIS — Z7951 Long term (current) use of inhaled steroids: Secondary | ICD-10-CM

## 2023-06-09 DIAGNOSIS — R0600 Dyspnea, unspecified: Secondary | ICD-10-CM | POA: Diagnosis not present

## 2023-06-09 DIAGNOSIS — R52 Pain, unspecified: Secondary | ICD-10-CM | POA: Diagnosis not present

## 2023-06-09 DIAGNOSIS — Z885 Allergy status to narcotic agent status: Secondary | ICD-10-CM

## 2023-06-09 DIAGNOSIS — Z91040 Latex allergy status: Secondary | ICD-10-CM

## 2023-06-09 DIAGNOSIS — Z87891 Personal history of nicotine dependence: Secondary | ICD-10-CM

## 2023-06-09 DIAGNOSIS — E1165 Type 2 diabetes mellitus with hyperglycemia: Secondary | ICD-10-CM | POA: Diagnosis present

## 2023-06-09 DIAGNOSIS — S42291A Other displaced fracture of upper end of right humerus, initial encounter for closed fracture: Secondary | ICD-10-CM | POA: Diagnosis not present

## 2023-06-09 DIAGNOSIS — I10 Essential (primary) hypertension: Secondary | ICD-10-CM | POA: Diagnosis not present

## 2023-06-09 DIAGNOSIS — M8580 Other specified disorders of bone density and structure, unspecified site: Secondary | ICD-10-CM | POA: Diagnosis not present

## 2023-06-09 DIAGNOSIS — W19XXXA Unspecified fall, initial encounter: Secondary | ICD-10-CM | POA: Diagnosis not present

## 2023-06-09 DIAGNOSIS — Z90710 Acquired absence of both cervix and uterus: Secondary | ICD-10-CM

## 2023-06-09 DIAGNOSIS — Z83438 Family history of other disorder of lipoprotein metabolism and other lipidemia: Secondary | ICD-10-CM

## 2023-06-09 DIAGNOSIS — Z8249 Family history of ischemic heart disease and other diseases of the circulatory system: Secondary | ICD-10-CM

## 2023-06-09 DIAGNOSIS — I5032 Chronic diastolic (congestive) heart failure: Secondary | ICD-10-CM | POA: Diagnosis present

## 2023-06-09 DIAGNOSIS — Z881 Allergy status to other antibiotic agents status: Secondary | ICD-10-CM

## 2023-06-09 DIAGNOSIS — I6522 Occlusion and stenosis of left carotid artery: Secondary | ICD-10-CM | POA: Diagnosis present

## 2023-06-09 DIAGNOSIS — R001 Bradycardia, unspecified: Secondary | ICD-10-CM | POA: Diagnosis not present

## 2023-06-09 DIAGNOSIS — E1151 Type 2 diabetes mellitus with diabetic peripheral angiopathy without gangrene: Secondary | ICD-10-CM | POA: Diagnosis present

## 2023-06-09 DIAGNOSIS — E785 Hyperlipidemia, unspecified: Secondary | ICD-10-CM | POA: Diagnosis present

## 2023-06-09 DIAGNOSIS — Z8673 Personal history of transient ischemic attack (TIA), and cerebral infarction without residual deficits: Secondary | ICD-10-CM

## 2023-06-09 DIAGNOSIS — S32511A Fracture of superior rim of right pubis, initial encounter for closed fracture: Secondary | ICD-10-CM | POA: Diagnosis not present

## 2023-06-09 DIAGNOSIS — H5462 Unqualified visual loss, left eye, normal vision right eye: Secondary | ICD-10-CM | POA: Diagnosis present

## 2023-06-09 DIAGNOSIS — N3289 Other specified disorders of bladder: Secondary | ICD-10-CM | POA: Diagnosis present

## 2023-06-09 DIAGNOSIS — S32591A Other specified fracture of right pubis, initial encounter for closed fracture: Secondary | ICD-10-CM | POA: Diagnosis present

## 2023-06-09 DIAGNOSIS — S42201A Unspecified fracture of upper end of right humerus, initial encounter for closed fracture: Secondary | ICD-10-CM | POA: Diagnosis not present

## 2023-06-09 DIAGNOSIS — Y92009 Unspecified place in unspecified non-institutional (private) residence as the place of occurrence of the external cause: Secondary | ICD-10-CM | POA: Diagnosis not present

## 2023-06-09 DIAGNOSIS — S0990XA Unspecified injury of head, initial encounter: Secondary | ICD-10-CM | POA: Diagnosis not present

## 2023-06-09 DIAGNOSIS — M1611 Unilateral primary osteoarthritis, right hip: Secondary | ICD-10-CM | POA: Diagnosis not present

## 2023-06-09 DIAGNOSIS — E611 Iron deficiency: Secondary | ICD-10-CM | POA: Diagnosis present

## 2023-06-09 DIAGNOSIS — J9811 Atelectasis: Secondary | ICD-10-CM | POA: Diagnosis not present

## 2023-06-09 DIAGNOSIS — N184 Chronic kidney disease, stage 4 (severe): Secondary | ICD-10-CM | POA: Diagnosis present

## 2023-06-09 DIAGNOSIS — S32599A Other specified fracture of unspecified pubis, initial encounter for closed fracture: Secondary | ICD-10-CM | POA: Diagnosis present

## 2023-06-09 DIAGNOSIS — E875 Hyperkalemia: Secondary | ICD-10-CM | POA: Diagnosis present

## 2023-06-09 DIAGNOSIS — Z515 Encounter for palliative care: Secondary | ICD-10-CM

## 2023-06-09 DIAGNOSIS — Z833 Family history of diabetes mellitus: Secondary | ICD-10-CM

## 2023-06-09 DIAGNOSIS — Z8674 Personal history of sudden cardiac arrest: Secondary | ICD-10-CM

## 2023-06-09 DIAGNOSIS — R0902 Hypoxemia: Secondary | ICD-10-CM | POA: Diagnosis not present

## 2023-06-09 DIAGNOSIS — I959 Hypotension, unspecified: Secondary | ICD-10-CM | POA: Diagnosis not present

## 2023-06-09 DIAGNOSIS — I2510 Atherosclerotic heart disease of native coronary artery without angina pectoris: Secondary | ICD-10-CM | POA: Diagnosis present

## 2023-06-09 DIAGNOSIS — F32A Depression, unspecified: Secondary | ICD-10-CM | POA: Diagnosis present

## 2023-06-09 DIAGNOSIS — Z880 Allergy status to penicillin: Secondary | ICD-10-CM

## 2023-06-09 DIAGNOSIS — I130 Hypertensive heart and chronic kidney disease with heart failure and stage 1 through stage 4 chronic kidney disease, or unspecified chronic kidney disease: Secondary | ICD-10-CM | POA: Diagnosis present

## 2023-06-09 DIAGNOSIS — Z825 Family history of asthma and other chronic lower respiratory diseases: Secondary | ICD-10-CM

## 2023-06-09 DIAGNOSIS — Z7902 Long term (current) use of antithrombotics/antiplatelets: Secondary | ICD-10-CM

## 2023-06-09 DIAGNOSIS — I6529 Occlusion and stenosis of unspecified carotid artery: Secondary | ICD-10-CM | POA: Diagnosis not present

## 2023-06-09 DIAGNOSIS — Z66 Do not resuscitate: Secondary | ICD-10-CM | POA: Diagnosis present

## 2023-06-09 DIAGNOSIS — N189 Chronic kidney disease, unspecified: Secondary | ICD-10-CM | POA: Diagnosis present

## 2023-06-09 DIAGNOSIS — J449 Chronic obstructive pulmonary disease, unspecified: Secondary | ICD-10-CM | POA: Diagnosis present

## 2023-06-09 DIAGNOSIS — I6782 Cerebral ischemia: Secondary | ICD-10-CM | POA: Diagnosis not present

## 2023-06-09 DIAGNOSIS — I69351 Hemiplegia and hemiparesis following cerebral infarction affecting right dominant side: Secondary | ICD-10-CM

## 2023-06-09 DIAGNOSIS — W010XXA Fall on same level from slipping, tripping and stumbling without subsequent striking against object, initial encounter: Secondary | ICD-10-CM | POA: Diagnosis present

## 2023-06-09 DIAGNOSIS — S199XXA Unspecified injury of neck, initial encounter: Secondary | ICD-10-CM | POA: Diagnosis not present

## 2023-06-09 DIAGNOSIS — M25519 Pain in unspecified shoulder: Secondary | ICD-10-CM | POA: Diagnosis not present

## 2023-06-09 DIAGNOSIS — R918 Other nonspecific abnormal finding of lung field: Secondary | ICD-10-CM | POA: Diagnosis not present

## 2023-06-09 DIAGNOSIS — Z882 Allergy status to sulfonamides status: Secondary | ICD-10-CM

## 2023-06-09 DIAGNOSIS — I739 Peripheral vascular disease, unspecified: Secondary | ICD-10-CM | POA: Diagnosis present

## 2023-06-09 DIAGNOSIS — E1122 Type 2 diabetes mellitus with diabetic chronic kidney disease: Secondary | ICD-10-CM | POA: Diagnosis present

## 2023-06-09 LAB — HEMOGLOBIN A1C
Hgb A1c MFr Bld: 8.1 % — ABNORMAL HIGH (ref 4.8–5.6)
Mean Plasma Glucose: 185.77 mg/dL

## 2023-06-09 LAB — BASIC METABOLIC PANEL WITH GFR
Anion gap: 9 (ref 5–15)
BUN: 53 mg/dL — ABNORMAL HIGH (ref 8–23)
CO2: 22 mmol/L (ref 22–32)
Calcium: 8.3 mg/dL — ABNORMAL LOW (ref 8.9–10.3)
Chloride: 106 mmol/L (ref 98–111)
Creatinine, Ser: 2.15 mg/dL — ABNORMAL HIGH (ref 0.44–1.00)
GFR, Estimated: 23 mL/min — ABNORMAL LOW (ref >60)
Glucose, Bld: 167 mg/dL — ABNORMAL HIGH (ref 70–99)
Potassium: 4.7 mmol/L (ref 3.5–5.1)
Sodium: 137 mmol/L (ref 135–145)

## 2023-06-09 LAB — I-STAT CHEM 8, ED
BUN: 53 mg/dL — ABNORMAL HIGH (ref 8–23)
Calcium, Ion: 1.12 mmol/L — ABNORMAL LOW (ref 1.15–1.40)
Chloride: 103 mmol/L (ref 98–111)
Creatinine, Ser: 2.3 mg/dL — ABNORMAL HIGH (ref 0.44–1.00)
Glucose, Bld: 164 mg/dL — ABNORMAL HIGH (ref 70–99)
HCT: 30 % — ABNORMAL LOW (ref 36.0–46.0)
Hemoglobin: 10.2 g/dL — ABNORMAL LOW (ref 12.0–15.0)
Potassium: 4.9 mmol/L (ref 3.5–5.1)
Sodium: 136 mmol/L (ref 135–145)
TCO2: 21 mmol/L — ABNORMAL LOW (ref 22–32)

## 2023-06-09 LAB — HEPATIC FUNCTION PANEL
ALT: 16 U/L (ref 0–44)
AST: 17 U/L (ref 15–41)
Albumin: 3.6 g/dL (ref 3.5–5.0)
Alkaline Phosphatase: 69 U/L (ref 38–126)
Bilirubin, Direct: <0.1 mg/dL (ref 0.0–0.2)
Total Bilirubin: 0.7 mg/dL (ref 0.0–1.2)
Total Protein: 6.2 g/dL — ABNORMAL LOW (ref 6.5–8.1)

## 2023-06-09 LAB — TYPE AND SCREEN
ABO/RH(D): O NEG
Antibody Screen: NEGATIVE

## 2023-06-09 LAB — CBC
HCT: 29.9 % — ABNORMAL LOW (ref 36.0–46.0)
Hemoglobin: 9.6 g/dL — ABNORMAL LOW (ref 12.0–15.0)
MCH: 31.6 pg (ref 26.0–34.0)
MCHC: 32.1 g/dL (ref 30.0–36.0)
MCV: 98.4 fL (ref 80.0–100.0)
Platelets: 233 10*3/uL (ref 150–400)
RBC: 3.04 MIL/uL — ABNORMAL LOW (ref 3.87–5.11)
RDW: 12.2 % (ref 11.5–15.5)
WBC: 16.7 10*3/uL — ABNORMAL HIGH (ref 4.0–10.5)
nRBC: 0 % (ref 0.0–0.2)

## 2023-06-09 LAB — ABO/RH: ABO/RH(D): O NEG

## 2023-06-09 LAB — T4, FREE: Free T4: 1.11 ng/dL (ref 0.61–1.12)

## 2023-06-09 LAB — TSH: TSH: 0.813 u[IU]/mL (ref 0.350–4.500)

## 2023-06-09 MED ORDER — DOCUSATE SODIUM 100 MG PO CAPS
100.0000 mg | ORAL_CAPSULE | Freq: Two times a day (BID) | ORAL | Status: DC
  Administered 2023-06-09 – 2023-06-10 (×2): 100 mg via ORAL
  Filled 2023-06-09 (×2): qty 1

## 2023-06-09 MED ORDER — CLOPIDOGREL BISULFATE 75 MG PO TABS
75.0000 mg | ORAL_TABLET | Freq: Every day | ORAL | Status: DC
  Administered 2023-06-10 – 2023-06-14 (×6): 75 mg via ORAL
  Filled 2023-06-09 (×6): qty 1

## 2023-06-09 MED ORDER — DEXTROSE-SODIUM CHLORIDE 5-0.9 % IV SOLN: INTRAVENOUS | Status: DC

## 2023-06-09 MED ORDER — ACETAMINOPHEN 325 MG PO TABS
650.0000 mg | ORAL_TABLET | Freq: Four times a day (QID) | ORAL | Status: DC | PRN
  Administered 2023-06-09 – 2023-06-10 (×2): 650 mg via ORAL
  Filled 2023-06-09 (×2): qty 2

## 2023-06-09 MED ORDER — FENTANYL CITRATE PF 50 MCG/ML IJ SOSY
25.0000 ug | PREFILLED_SYRINGE | Freq: Once | INTRAMUSCULAR | Status: AC
  Administered 2023-06-09: 25 ug via INTRAVENOUS
  Filled 2023-06-09: qty 1

## 2023-06-09 MED ORDER — ROSUVASTATIN CALCIUM 5 MG PO TABS
5.0000 mg | ORAL_TABLET | Freq: Every day | ORAL | Status: DC
  Administered 2023-06-10 – 2023-06-14 (×6): 5 mg via ORAL
  Filled 2023-06-09 (×6): qty 1

## 2023-06-09 MED ORDER — HEPARIN SODIUM (PORCINE) 5000 UNIT/ML IJ SOLN
5000.0000 [IU] | Freq: Three times a day (TID) | INTRAMUSCULAR | Status: DC
  Administered 2023-06-09 – 2023-06-10 (×3): 5000 [IU] via SUBCUTANEOUS
  Filled 2023-06-09 (×3): qty 1

## 2023-06-09 MED ORDER — TAMSULOSIN HCL 0.4 MG PO CAPS
0.4000 mg | ORAL_CAPSULE | Freq: Every evening | ORAL | Status: DC
  Administered 2023-06-10: 0.4 mg via ORAL
  Filled 2023-06-09: qty 1

## 2023-06-09 MED ORDER — HYDROMORPHONE HCL 1 MG/ML IJ SOLN
0.5000 mg | INTRAMUSCULAR | Status: AC | PRN
  Administered 2023-06-09 – 2023-06-10 (×3): 0.5 mg via INTRAVENOUS
  Filled 2023-06-09 (×3): qty 0.5

## 2023-06-09 MED ORDER — HYDROCODONE-ACETAMINOPHEN 5-325 MG PO TABS
1.0000 | ORAL_TABLET | Freq: Once | ORAL | Status: AC
  Administered 2023-06-09: 1 via ORAL
  Filled 2023-06-09: qty 1

## 2023-06-09 NOTE — H&P (Addendum)
 History and Physical    Patient: Chelsea [name] FMW:[id number] DOB: [birth year] DOA: 06/09/2023 DOS: the patient was seen and examined on 06/09/2023 PCP: Jesus Elberta Gainer, FNP  Patient coming from: Home Chief complaint: Chief Complaint  Patient presents with   Fall   HPI:  Chelsea [name] is a 78 y.o. female with past medical history  of  hx of CVA, cardiac arrest, carotid artery occlusion, normocytic anemia followed by hematology oncology, COPD, CKD stage IIIb, allergies to 13 medications, diabetes mellitus without complication, hypertension but blood pressure fluctuating history, and stroke , fell at home on her right side.  Patient was admitted in June of last year with a hip fracture of the right hip S/p INTRAMEDULLARY (IM) NAIL INTERTROCHANTERIC, REPAIR OF RIGHT HIP FRACTURE 6/3 by Dr. Celena.    ED Course: Pt in ed at bedside  is pale appearing and alert, oriented and tired and per daughter bedside mom is DNR and she is in her right mind and pt agrees with code status and no resuscitation. Daughter also reports that mom since her stroke is not doing well with balance. And the right leg is weak.  Vital signs in the ED were notable for the following:  Vitals:   06/09/23 0955 06/09/23 1329 06/09/23 1334 06/09/23 1524  BP: (!) 121/56 91/76 (!) 114/44 (!) 100/59  Pulse: (!) 56 84  63  Temp: (!) 97.5 F (36.4 C) 98.4 F (36.9 C)  98.6 F (37 C)  Resp: 18 16  18   Height: 5' 7 (1.702 m)     Weight: 74.8 kg     SpO2: 100% 96%  91%  TempSrc: Oral   Oral  BMI (Calculated): 25.84     >>ED evaluation thus far shows: Initial BMP shows glucose of 164 BUN of 53 creatinine of 2.30 calcium  of fluid. EKG sinus rhythm with LVH, heart rate of 63 PR prolonged at 202 QTc 485 LFTs added on and pending. CBC showing anemia with a hemoglobin of 9.6 white count of 16.7 platelets of 233. Hip x-ray showing fracture superior and inferior pubic rami. X-ray of the right shoulder shows a mildly  displaced fracture through the right humeral neck and greater tuberosity currently in a sling.  >>Echo  06/13/2022:  1. Left ventricular ejection fraction, by estimation, is 70 to 75%. The  left ventricle has hyperdynamic function. The left ventricle has no  regional wall motion abnormalities. There is mild left ventricular  hypertrophy. Left ventricular diastolic  parameters are consistent with Grade II diastolic dysfunction  (pseudonormalization). Elevated left atrial pressure.   2. Right ventricular systolic function was not well visualized. The right  ventricular size is not well visualized.   3. Right atrial size was mildly dilated.   4. The mitral valve is degenerative. Trivial mitral valve regurgitation.  Moderate mitral annular calcification.   5. The aortic valve is tricuspid. Aortic valve regurgitation is not  visualized. Aortic valve sclerosis/calcification is present, without any  evidence of aortic stenosis.  >>While in the ED patient received the following: Medications  acetaminophen  (TYLENOL ) tablet 650 mg (650 mg Oral Given 06/09/23 1126)  HYDROcodone -acetaminophen  (NORCO/VICODIN) 5-325 MG per tablet 1 tablet (1 tablet Oral Given 06/09/23 1126)  fentaNYL  (SUBLIMAZE ) injection 25 mcg (25 mcg Intravenous Given 06/09/23 1251)   Review of Systems  Musculoskeletal:  Positive for falls and joint pain.  Neurological:  Positive for dizziness.   Past Medical History:  Diagnosis Date   Allergy    Anemia  Blind left eye    Cardiac arrest [facility])    Carotid artery occlusion    Cataract    Depression    Diabetes mellitus without complication (HCC)    Hyperlipidemia    Hypertension    Oxygen  deficiency    Stroke [facility])    Past Surgical History:  Procedure Laterality Date   ABDOMINAL HYSTERECTOMY     ENDARTERECTOMY Left 12/03/2019   Procedure: LEFT CAROTID ENDARTERECTOMY;  Surgeon: Eliza Lonni RAMAN, MD;  Location: [facility] OR;  Service: Vascular;  Laterality: Left;    INTRAMEDULLARY (IM) NAIL INTERTROCHANTERIC Right 06/14/2022   Procedure: INTRAMEDULLARY (IM) NAIL INTERTROCHANTERIC, REPAIR OF RIGHT HIP FRACTURE;  Surgeon: Celena Sharper, MD;  Location: [facility] OR;  Service: Orthopedics;  Laterality: Right;   PATCH ANGIOPLASTY Left 12/03/2019   Procedure: PATCH ANGIOPLASTY Left Carotid Artery;  Surgeon: Eliza Lonni RAMAN, MD;  Location: [facility] OR;  Service: Vascular;  Laterality: Left;    reports that she has quit smoking. Her smoking use included cigarettes. She has never used smokeless tobacco. She reports that she does not currently use alcohol. She reports that she does not use drugs. Allergies  Allergen Reactions   Codeine Anaphylaxis and Swelling    Throat swells     Latex Anaphylaxis   Penicillins Anaphylaxis and Swelling    Throat closes   Tolerated Cephalosporin Date: 06/14/22.     Sulfa Antibiotics Anaphylaxis, Other (See Comments) and Swelling    Lips and eye swell   Atorvastatin  Diarrhea and Nausea And Vomiting   Benzodiazepines Hives   Cephalosporins Other (See Comments)    Reaction type/severity unknown - Tolerated Cephalosporin Date: 06/14/22.     Gabapentin Nausea And Vomiting    Other Reaction(s): GI Intolerance   Lidocaine  Hives   Oxycodone-Acetaminophen  Palpitations   Pregabalin  Diarrhea and Nausea And Vomiting   Trazodone Other (See Comments)    Sweating, dizziness   Family History  Problem Relation Age of Onset   Hypertension Mother    Hyperlipidemia Mother    Diabetes Mother    Cancer Mother    Arthritis Mother    Heart attack Mother    Hypertension Father    Hyperlipidemia Father    Diabetes Father    Arthritis Father    Stroke Father    Hypertension Sister    Heart disease Sister    Heart attack Sister    Diabetes Sister    Depression Sister    COPD Sister    Arthritis Sister    Stroke Brother    Hypertension Brother    Heart disease Brother    Diabetes Brother    Depression Brother    Arthritis Brother     Stroke Daughter    Hypertension Daughter    Diabetes Daughter    Depression Daughter    Arthritis Daughter    Diabetes Son    Depression Son    Prior to Admission medications   Medication Sig Start Date End Date Taking? Authorizing Provider  acetaminophen  (TYLENOL ) 325 MG tablet Take 1-2 tablets (325-650 mg total) by mouth every 6 (six) hours as needed for mild pain (pain score 1-3 or temp > 100.5). 06/18/22   Deward Eck, PA-C  albuterol  (VENTOLIN  HFA) 108 (90 Base) MCG/ACT inhaler Inhale 2 puffs into the lungs every 4 (four) hours as needed. 08/23/22   [provider]  carvedilol  (COREG ) 3.125 MG tablet TAKE ONE TABLET TWICE DAILY WITH MEAL(S) 03/23/23   Raford Riggs, MD  cholecalciferol  (CHOLECALCIFEROL ) 25 MCG tablet Take 2  tablets (2,000 Units total) by mouth daily. 06/19/22   Deward Eck, PA-C  clopidogrel  (PLAVIX ) 75 MG tablet TAKE 1 TABLET ONCE DAILY 12/24/22   Lonni Slain, MD  Docusate Sodium  (STOOL SOFTENER) 100 MG capsule Take 1 tablet (100 mg total) by mouth 2 (two) times daily. 06/25/22   Cindy Garnette POUR, MD  feeding supplement, GLUCERNA SHAKE, (GLUCERNA SHAKE) LIQD Take 237 mLs by mouth 3 (three) times daily between meals. 06/18/22   Deward Eck, PA-C  fluticasone  furoate-vilanterol (BREO ELLIPTA ) 100-25 MCG/ACT AEPB Inhale 1 puff into the lungs daily.    [provider]  fluticasone -salmeterol (ADVAIR) 250-50 MCG/ACT AEPB INHALE 1 PUFF 2 TIMES A DAY. RINSE MOUTH AFTER USE. 01/19/23   Neda Jennet LABOR, MD  guaiFENesin  (MUCINEX ) 600 MG 12 hr tablet Take 1 tablet (600 mg total) by mouth 2 (two) times daily. Patient taking differently: Take 600 mg by mouth 2 (two) times daily as needed. 10/10/20   Jerri Keys, MD  ipratropium-albuterol  (DUONEB) 0.5-2.5 (3) MG/3ML SOLN Inhale 3 mLs into the lungs every 4 (four) hours as needed. 02/16/23   Olalere, Jennet A, MD  KERENDIA  10 MG TABS Take 1 tablet by mouth daily. 11/16/21   [provider]  olmesartan  (BENICAR) 20 MG tablet Take 10 mg by mouth daily.    [provider]  polyethylene glycol (MIRALAX  / GLYCOLAX ) 17 g packet Take 17 g by mouth daily as needed for moderate constipation. 06/25/22   Cindy Garnette POUR, MD  rosuvastatin  (CRESTOR ) 5 MG tablet TAKE ONE TABLET DAILY 03/23/23   Raford Riggs, MD  tamsulosin  (FLOMAX ) 0.4 MG CAPS capsule Take 0.4 mg by mouth every evening. 07/07/20   [provider]  torsemide  (DEMADEX ) 20 MG tablet Take 20 mg by mouth daily. Takes 40 mg if swelling    [provider]                                                                                 Vitals:   06/09/23 0955 06/09/23 1329 06/09/23 1334 06/09/23 1524  BP: (!) 121/56 91/76 (!) 114/44 (!) 100/59  Pulse: (!) 56 84  63  Resp: 18 16  18   Temp: (!) 97.5 F (36.4 C) 98.4 F (36.9 C)  98.6 F (37 C)  TempSrc: Oral   Oral  SpO2: 100% 96%  91%  Weight: 74.8 kg     Height: 5' 7 (1.702 m)      Physical Exam Vitals and nursing note reviewed.  Constitutional:      General: She is not in acute distress. HENT:     Head: Normocephalic and atraumatic.     Right Ear: Hearing normal.     Left Ear: Hearing normal.     Nose: No nasal deformity.     Mouth/Throat:     Lips: Pink.   Eyes:     General: Lids are normal.     Extraocular Movements: Extraocular movements intact.     Pupils: Pupils are equal, round, and reactive to light.    Cardiovascular:     Rate and Rhythm: Normal rate and regular rhythm.     Heart sounds: Normal heart sounds.  Pulmonary:  Effort: Pulmonary effort is normal.     Breath sounds: Normal breath sounds.  Abdominal:     General: Bowel sounds are normal. There is no distension.     Palpations: Abdomen is soft. There is no mass.     Tenderness: There is no abdominal tenderness.   Musculoskeletal:     Right shoulder: Tenderness present.     Right hip: Tenderness present.     Right lower leg: No edema.     Left lower leg: No edema.    Skin:    General: Skin is warm.   Neurological:     General: No focal deficit present.     Mental Status: She is alert and oriented to person, place, and time.     Cranial Nerves: Cranial nerves 2-12 are intact.   Psychiatric:        Speech: Speech normal.     Labs on Admission: I have personally reviewed following labs and imaging studies CBC: Recent Labs  Lab 06/09/23 1250 06/09/23 1307  WBC 16.7*  --   HGB 9.6* 10.2*  HCT 29.9* 30.0*  MCV 98.4  --   PLT 233  --    Basic Metabolic Panel: Recent Labs  Lab 06/09/23 1250 06/09/23 1307  NA 137 136  K 4.7 4.9  CL 106 103  CO2 22  --   GLUCOSE 167* 164*  BUN 53* 53*  CREATININE 2.15* 2.30*  CALCIUM  8.3*  --    GFR: Estimated Creatinine Clearance: 20.9 mL/min (A) (by C-G formula based on SCr of 2.3 mg/dL (H)). Liver Function Tests: No results for input(s): AST, ALT, ALKPHOS, BILITOT, PROT, ALBUMIN in the last 168 hours. No results for input(s): LIPASE, AMYLASE in the last 168 hours. No results for input(s): AMMONIA in the last 168 hours. Coagulation Profile: No results for input(s): INR, PROTIME in the last 168 hours. Cardiac Enzymes: No results for input(s): CKTOTAL, CKMB, CKMBINDEX, TROPONINI in the last 168 hours. BNP (last 3 results) No results for input(s): PROBNP in the last 8760 hours. HbA1C: No results for input(s): HGBA1C in the last 72 hours. CBG: No results for input(s): GLUCAP in the last 168 hours. Lipid Profile: No results for input(s): CHOL, HDL, LDLCALC, TRIG, CHOLHDL, LDLDIRECT in the last 72 hours. Thyroid  Function Tests: No results for input(s): TSH, T4TOTAL, FREET4, T3FREE, THYROIDAB in the last 72 hours. Anemia Panel: No results for input(s): VITAMINB12, FOLATE, FERRITIN, TIBC, IRON , RETICCTPCT in the last 72 hours. Urine analysis:    Component Value Date/Time   COLORURINE YELLOW 06/17/2022 0841   APPEARANCEUR  CLEAR 06/17/2022 0841   LABSPEC 1.020 06/17/2022 0841   PHURINE 5.0 06/17/2022 0841   GLUCOSEU NEGATIVE 06/17/2022 0841   HGBUR NEGATIVE 06/17/2022 0841   BILIRUBINUR NEGATIVE 06/17/2022 0841   BILIRUBINUR negative 10/06/2020 1210   KETONESUR NEGATIVE 06/17/2022 0841   PROTEINUR NEGATIVE 06/17/2022 0841   UROBILINOGEN negative (A) 10/06/2020 1210   UROBILINOGEN 0.2 09/17/2008 1929   NITRITE NEGATIVE 06/17/2022 0841   LEUKOCYTESUR NEGATIVE 06/17/2022 0841   Radiological Exams on Admission: DG Hip Unilat W or Wo Pelvis 2-3 Views Right Result Date: 06/09/2023 CLINICAL DATA:  Fall. EXAM: DG HIP (WITH OR WITHOUT PELVIS) 2-3V RIGHT COMPARISON:  06/25/2022 FINDINGS: Interlocking screw and rod are again seen in the right hip. There is no evidence of acute hip fracture or dislocation. Fractures of the right superior and inferior pubic rami are new since previous study. Pubic symphysis degenerative changes again noted. Generalized osteopenia. IMPRESSION: Fractures of  right superior and inferior pubic rami, new since previous study. No evidence of acute hip fracture or dislocation. Osteopenia. Electronically Signed   By: Norleen DELENA Kil M.D.   On: 06/09/2023 11:11   CT Head Wo Contrast Result Date: 06/09/2023 CLINICAL DATA:  Head trauma, neck trauma. EXAM: CT HEAD WITHOUT CONTRAST CT CERVICAL SPINE WITHOUT CONTRAST TECHNIQUE: Multidetector CT imaging of the head and cervical spine was performed following the standard protocol without intravenous contrast. Multiplanar CT image reconstructions of the cervical spine were also generated. RADIATION DOSE REDUCTION: This exam was performed according to the departmental dose-optimization program which includes automated exposure control, adjustment of the mA and/or kV according to patient size and/or use of iterative reconstruction technique. COMPARISON:  CT head 06/17/2022. FINDINGS: CT HEAD FINDINGS Brain: No acute intracranial hemorrhage. No CT evidence of acute  infarct. Similar remote infarct in the left parietal lobe. Remote lacunar infarcts in the bilateral basal ganglia. Nonspecific hypoattenuation in the periventricular and subcortical white matter favored to reflect chronic microvascular ischemic changes. No edema, mass effect, or midline shift. The basilar cisterns are patent. Ventricles: The ventricles are normal. Vascular: Atherosclerotic calcifications of the carotid siphons. No hyperdense vessel. Skull: No acute or aggressive finding. Orbits: Orbits are symmetric. Sinuses: Mucosal thickening in the left maxillary sinus. Other: Mastoid air cells are clear. CT CERVICAL SPINE FINDINGS Alignment: Cervical lordosis is maintained. No significant listhesis. No facet subluxation or dislocation. Skull base and vertebrae: No acute fracture. No primary bone lesion or focal pathologic process. Soft tissues and spinal canal: No prevertebral fluid or swelling. No visible canal hematoma. Disc levels: Mild disc space narrowing at C6-7. No high-grade osseous spinal canal stenosis. Facet arthrosis at multiple levels in the cervical spine. No high-grade osseous foraminal stenosis. Upper chest: Negative. Other: None. IMPRESSION: No CT evidence of acute intracranial abnormality. No acute fracture or traumatic malalignment of the cervical spine. Remote infarcts as above.  Chronic microvascular ischemic changes. Electronically Signed   By: Donnice Mania M.D.   On: 06/09/2023 11:07   CT Cervical Spine Wo Contrast Result Date: 06/09/2023 CLINICAL DATA:  Head trauma, neck trauma. EXAM: CT HEAD WITHOUT CONTRAST CT CERVICAL SPINE WITHOUT CONTRAST TECHNIQUE: Multidetector CT imaging of the head and cervical spine was performed following the standard protocol without intravenous contrast. Multiplanar CT image reconstructions of the cervical spine were also generated. RADIATION DOSE REDUCTION: This exam was performed according to the departmental dose-optimization program which includes  automated exposure control, adjustment of the mA and/or kV according to patient size and/or use of iterative reconstruction technique. COMPARISON:  CT head 06/17/2022. FINDINGS: CT HEAD FINDINGS Brain: No acute intracranial hemorrhage. No CT evidence of acute infarct. Similar remote infarct in the left parietal lobe. Remote lacunar infarcts in the bilateral basal ganglia. Nonspecific hypoattenuation in the periventricular and subcortical white matter favored to reflect chronic microvascular ischemic changes. No edema, mass effect, or midline shift. The basilar cisterns are patent. Ventricles: The ventricles are normal. Vascular: Atherosclerotic calcifications of the carotid siphons. No hyperdense vessel. Skull: No acute or aggressive finding. Orbits: Orbits are symmetric. Sinuses: Mucosal thickening in the left maxillary sinus. Other: Mastoid air cells are clear. CT CERVICAL SPINE FINDINGS Alignment: Cervical lordosis is maintained. No significant listhesis. No facet subluxation or dislocation. Skull base and vertebrae: No acute fracture. No primary bone lesion or focal pathologic process. Soft tissues and spinal canal: No prevertebral fluid or swelling. No visible canal hematoma. Disc levels: Mild disc space narrowing at C6-7. No high-grade osseous spinal  canal stenosis. Facet arthrosis at multiple levels in the cervical spine. No high-grade osseous foraminal stenosis. Upper chest: Negative. Other: None. IMPRESSION: No CT evidence of acute intracranial abnormality. No acute fracture or traumatic malalignment of the cervical spine. Remote infarcts as above.  Chronic microvascular ischemic changes. Electronically Signed   By: Donnice Mania M.D.   On: 06/09/2023 11:07   DG Shoulder Right Result Date: 06/09/2023 CLINICAL DATA:  Fall.  Right shoulder pain. EXAM: RIGHT SHOULDER - 2+ VIEW COMPARISON:  None Available. FINDINGS: Acute mildly displaced fracture is seen involving the right humeral neck and extending  through the greater tuberosity. No evidence of dislocation. IMPRESSION: Mildly displaced fracture through the right humeral neck and greater tuberosity. Electronically Signed   By: Norleen DELENA Kil M.D.   On: 06/09/2023 11:07   Data Reviewed: Relevant notes from primary care and specialist visits, past discharge summaries as available in EHR, including Care Everywhere. Prior diagnostic testing as pertinent to current admission diagnoses, Updated medications and problem lists for reconciliation ED course, including vitals, labs, imaging, treatment and response to treatment,Triage notes, nursing and pharmacy notes and ED provider's notes Notable results as noted in HPI.Discussed case with EDMD/ ED APP/ or Specialty MD on call and as needed.  Assessment & Plan  >> Right pubic rami fracture: Pain control with Tylenol  and as needed hydrocodone . Bedrest for 1 to 2 days per orthopedic and weightbearing as tolerated. Physical therapy to avoid deconditioning. DVT prophylaxis. Fall precautions while here and fall prevention at home.   >>Right humerus fracture: Orthopedic - michael jeffreys. Sling / Pain control and PT.    >> AKI on CKD: Will avoid contrast and start pt on low dose d5ns at 20 cc x 24 hours.     >> Fall risk: Fall precaution and SW consult for home eval and fall proofing home. Additional resources per TOC.   >> Essential hypertension/labile blood pressures: Vitals:   06/09/23 0955 06/09/23 1329 06/09/23 1334 06/09/23 1524  BP: (!) 121/56 91/76 (!) 114/44 (!) 100/59  Currently we will hold BP meds: coreg / benicar.  >> Diabetes mellitus type 2: Glycemic protocol. Soft carb consistent diet.   >> History of CVA: Pt is on plavix  which we will continue once pt passes swallow.    DVT prophylaxis:  Heparin . Consults:  Orthopedic.   Advance Care Planning:    Code Status: Limited: Do not attempt resuscitation (DNR) -DNR-LIMITED -Do Not Intubate/DNI    Family Communication:   None Disposition Plan:  Short-term rehab . Severity of Illness: The appropriate patient status for this patient is OBSERVATION. Observation status is judged to be reasonable and necessary in order to provide the required intensity of service to ensure the patient's safety. The patient's presenting symptoms, physical exam findings, and initial radiographic and laboratory data in the context of their medical condition is felt to place them at decreased risk for further clinical deterioration. Furthermore, it is anticipated that the patient will be medically stable for discharge from the hospital within 2 midnights of admission.   Unresulted Labs (From admission, onward)     Start     Ordered   06/10/23 0500  Basic metabolic panel  Tomorrow morning,   R        06/09/23 1528   06/10/23 0500  CBC  Tomorrow morning,   R        06/09/23 1528   06/10/23 0500  Magnesium   Tomorrow morning,   R        06/09/23  1528   06/09/23 1529  Type and screen  Once,   R        06/09/23 1528   06/09/23 1442  TSH  Once,   R        06/09/23 1442   06/09/23 1442  T4, free  Add-on,   AD        06/09/23 1442   06/09/23 1442  Hemoglobin A1c  Once,   R        06/09/23 1442   06/09/23 1350  Hepatic function panel  Add-on,   AD        06/09/23 1349            Orders Placed This Encounter  Procedures   CT Head Wo Contrast   CT Cervical Spine Wo Contrast   DG Shoulder Right   DG Hip Unilat W or Wo Pelvis 2-3 Views Right   CBC   Basic metabolic panel   Hepatic function panel   TSH   T4, free   Hemoglobin A1c   Basic metabolic panel   CBC   Magnesium    Diet heart healthy/carb modified Room service appropriate? Yes; Fluid consistency: Thin   Apply shoulder immobilizer/sling   Apply ice   Vital signs   Notify physician (specify)   Mobility Protocol: No Restrictions RN to initiate protocols based on patient's level of care   Refer to Sidebar Report Refer to ICU, Med-Surg, Progressive, and Step-Down  Mobility Protocol Sidebars   Initiate Adult Central Line Maintenance and Catheter Protocol for patients with central line (CVC, PICC, Port, Hemodialysis, Trialysis)   Do not place and if present remove PureWick   Initiate Oral Care Protocol   Initiate Carrier Fluid Protocol   RN may order General Admission PRN Orders utilizing General Admission PRN medications (through manage orders) for the following patient needs: allergy symptoms (Claritin), cold sores (Carmex), cough (Robitussin DM), eye irritation (Liquifilm Tears), hemorrhoids (Tucks), indigestion (Maalox), minor skin irritation (Hydrocortisone  Cream), muscle pain (Ben Gay), nose irritation (saline nasal spray) and sore throat (Chloraseptic spray).   Cardiac Monitoring - Continuous Indefinite   Swallow screen   Do not attempt resuscitation (DNR)- Limited -Do Not Intubate (DNI)   Consult to orthopedic surgery   Consult to hospitalist   Oxygen  therapy Mode or (Route): Nasal cannula; Liters Per Minute: 2; Keep O2 saturation between: greater than 92 %   I-stat chem 8, ED (not at [facility], [facility] or [facility])   ED EKG   EKG 12-Lead   Type and screen   Place in observation (patient's expected length of stay will be less than 2 midnights)   Aspiration precautions   Fall precautions    Author: Mario LULLA Blanch, MD 12 pm -8 pm. [facility] 06/09/2023 3:36 PM >>Please note for any concern,or critical results after hours past 8pm please contact the [facility] floor coverage provider from 7 PM- 7 AM. For on call review [email or web address], username [email or web address] and PW: your phone number<<

## 2023-06-09 NOTE — ED Triage Notes (Signed)
 Pt bib [facility] form home for a fall. Pt tripped and fell onto her right side. Pt c.o right shoulder pain and right hip pain. Pt given 50mcg fentanyl  PTA and 4mg  zofran  due to nausea.  Pt a.o, VSS c collar in place

## 2023-06-09 NOTE — ED Provider Notes (Addendum)
 [place] EMERGENCY DEPARTMENT AT [facility] Provider Note   CSN: [id number] Arrival date & time: 06/09/23  9057     History  Chief Complaint  Patient presents with   [place]    Chelsea [name] is a 78 y.o. female.  Medical history of diabetes, hypertension, hyperlipidemia, CVA presenting to emergency room after fall.  Reports mechanical fall when standing up to use toilet.  She reports that she fell forward landing on her right arm and hip.  She does not think she hit her head and denies any headache or neck pain.  She is not on blood thinner but on clopidogrel .  Family found her right after she fell.  She is acting appropriately but complaining of right shoulder and right hip pain.  At baseline she uses a walker to ambulate and lives at home with family member.  Was given fentanyl  by EMS and has had mild improvement in symptoms   Fall       Home Medications Prior to Admission medications   Medication Sig Start Date End Date Taking? Authorizing Provider  acetaminophen  (TYLENOL ) 325 MG tablet Take 1-2 tablets (325-650 mg total) by mouth every 6 (six) hours as needed for mild pain (pain score 1-3 or temp > 100.5). 06/18/22   Deward Eck, PA-C  albuterol  (VENTOLIN  HFA) 108 (90 Base) MCG/ACT inhaler Inhale 2 puffs into the lungs every 4 (four) hours as needed. 08/23/22   [provider]  carvedilol  (COREG ) 3.125 MG tablet TAKE ONE TABLET TWICE DAILY WITH MEAL(S) 03/23/23   Raford Riggs, MD  cholecalciferol  (CHOLECALCIFEROL ) 25 MCG tablet Take 2 tablets (2,000 Units total) by mouth daily. 06/19/22   Deward Eck, PA-C  clopidogrel  (PLAVIX ) 75 MG tablet TAKE 1 TABLET ONCE DAILY 12/24/22   Lonni Slain, MD  Docusate Sodium  (STOOL SOFTENER) 100 MG capsule Take 1 tablet (100 mg total) by mouth 2 (two) times daily. 06/25/22   Cindy Garnette POUR, MD  feeding supplement, GLUCERNA SHAKE, (GLUCERNA SHAKE) LIQD Take 237 mLs by mouth 3 (three) times daily between meals. 06/18/22    Deward Eck, PA-C  fluticasone  furoate-vilanterol (BREO ELLIPTA ) 100-25 MCG/ACT AEPB Inhale 1 puff into the lungs daily.    [provider]  fluticasone -salmeterol (ADVAIR) 250-50 MCG/ACT AEPB INHALE 1 PUFF 2 TIMES A DAY. RINSE MOUTH AFTER USE. 01/19/23   Neda Hammond A, MD  guaiFENesin  (MUCINEX ) 600 MG 12 hr tablet Take 1 tablet (600 mg total) by mouth 2 (two) times daily. Patient taking differently: Take 600 mg by mouth 2 (two) times daily as needed. 10/10/20   Jerri Keys, MD  ipratropium-albuterol  (DUONEB) 0.5-2.5 (3) MG/3ML SOLN Inhale 3 mLs into the lungs every 4 (four) hours as needed. 02/16/23   Olalere, Hammond A, MD  KERENDIA  10 MG TABS Take 1 tablet by mouth daily. 11/16/21   [provider]  olmesartan (BENICAR) 20 MG tablet Take 10 mg by mouth daily.    [provider]  polyethylene glycol (MIRALAX  / GLYCOLAX ) 17 g packet Take 17 g by mouth daily as needed for moderate constipation. 06/25/22   Cindy Garnette POUR, MD  rosuvastatin  (CRESTOR ) 5 MG tablet TAKE ONE TABLET DAILY 03/23/23   Raford Riggs, MD  tamsulosin  (FLOMAX ) 0.4 MG CAPS capsule Take 0.4 mg by mouth every evening. 07/07/20   [provider]  torsemide  (DEMADEX ) 20 MG tablet Take 20 mg by mouth daily. Takes 40 mg if swelling    [provider]      Allergies  Codeine, Latex, Penicillins, Sulfa antibiotics, Benzodiazepines, Cephalosporins, Lidocaine , Oxycodone-acetaminophen , Trazodone, Atorvastatin , Cephalexin, Gabapentin, and Pregabalin     Review of Systems   Review of Systems  Musculoskeletal:  Positive for arthralgias.    Physical Exam Updated Vital Signs BP (!) 121/56   Pulse (!) 56   Temp (!) 97.5 F (36.4 C) (Oral)   Resp 18   Ht 5' 7 (1.702 m)   Wt 74.8 kg   SpO2 100%   BMI 25.84 kg/m  Physical Exam Vitals and nursing note reviewed.  Constitutional:      General: She is not in acute distress.    Appearance: She is not toxic-appearing.  HENT:     Head:  Normocephalic and atraumatic.   Eyes:     General: No scleral icterus.    Conjunctiva/sclera: Conjunctivae normal.    Cardiovascular:     Rate and Rhythm: Normal rate and regular rhythm.     Pulses: Normal pulses.     Heart sounds: Normal heart sounds.  Pulmonary:     Effort: Pulmonary effort is normal. No respiratory distress.     Breath sounds: Normal breath sounds.  Abdominal:     General: Abdomen is flat. Bowel sounds are normal.     Palpations: Abdomen is soft.     Tenderness: There is no abdominal tenderness.   Musculoskeletal:     Comments: TTP over right shoulder.  TTP over right pubic rami.  Neurovascular intact.    Skin:    General: Skin is warm and dry.     Findings: No lesion.   Neurological:     General: No focal deficit present.     Mental Status: She is alert and oriented to person, place, and time. Mental status is at baseline.     ED Results / Procedures / Treatments   Labs (all labs ordered are listed, but only abnormal results are displayed) Labs Reviewed - No data to display  EKG None  Radiology DG Hip Unilat W or Wo Pelvis 2-3 Views Right Result Date: 06/09/2023 CLINICAL DATA:  Fall. EXAM: DG HIP (WITH OR WITHOUT PELVIS) 2-3V RIGHT COMPARISON:  06/25/2022 FINDINGS: Interlocking screw and rod are again seen in the right hip. There is no evidence of acute hip fracture or dislocation. Fractures of the right superior and inferior pubic rami are new since previous study. Pubic symphysis degenerative changes again noted. Generalized osteopenia. IMPRESSION: Fractures of right superior and inferior pubic rami, new since previous study. No evidence of acute hip fracture or dislocation. Osteopenia. Electronically Signed   By: Norleen DELENA Kil M.D.   On: 06/09/2023 11:11   CT Head Wo Contrast Result Date: 06/09/2023 CLINICAL DATA:  Head trauma, neck trauma. EXAM: CT HEAD WITHOUT CONTRAST CT CERVICAL SPINE WITHOUT CONTRAST TECHNIQUE: Multidetector CT imaging of the  head and cervical spine was performed following the standard protocol without intravenous contrast. Multiplanar CT image reconstructions of the cervical spine were also generated. RADIATION DOSE REDUCTION: This exam was performed according to the departmental dose-optimization program which includes automated exposure control, adjustment of the mA and/or kV according to patient size and/or use of iterative reconstruction technique. COMPARISON:  CT head 06/17/2022. FINDINGS: CT HEAD FINDINGS Brain: No acute intracranial hemorrhage. No CT evidence of acute infarct. Similar remote infarct in the left parietal lobe. Remote lacunar infarcts in the bilateral basal ganglia. Nonspecific hypoattenuation in the periventricular and subcortical white matter favored to reflect chronic microvascular ischemic changes. No edema, mass effect, or midline shift. The basilar cisterns are  patent. Ventricles: The ventricles are normal. Vascular: Atherosclerotic calcifications of the carotid siphons. No hyperdense vessel. Skull: No acute or aggressive finding. Orbits: Orbits are symmetric. Sinuses: Mucosal thickening in the left maxillary sinus. Other: Mastoid air cells are clear. CT CERVICAL SPINE FINDINGS Alignment: Cervical lordosis is maintained. No significant listhesis. No facet subluxation or dislocation. Skull base and vertebrae: No acute fracture. No primary bone lesion or focal pathologic process. Soft tissues and spinal canal: No prevertebral fluid or swelling. No visible canal hematoma. Disc levels: Mild disc space narrowing at C6-7. No high-grade osseous spinal canal stenosis. Facet arthrosis at multiple levels in the cervical spine. No high-grade osseous foraminal stenosis. Upper chest: Negative. Other: None. IMPRESSION: No CT evidence of acute intracranial abnormality. No acute fracture or traumatic malalignment of the cervical spine. Remote infarcts as above.  Chronic microvascular ischemic changes. Electronically Signed    By: Donnice Mania M.D.   On: 06/09/2023 11:07   CT Cervical Spine Wo Contrast Result Date: 06/09/2023 CLINICAL DATA:  Head trauma, neck trauma. EXAM: CT HEAD WITHOUT CONTRAST CT CERVICAL SPINE WITHOUT CONTRAST TECHNIQUE: Multidetector CT imaging of the head and cervical spine was performed following the standard protocol without intravenous contrast. Multiplanar CT image reconstructions of the cervical spine were also generated. RADIATION DOSE REDUCTION: This exam was performed according to the departmental dose-optimization program which includes automated exposure control, adjustment of the mA and/or kV according to patient size and/or use of iterative reconstruction technique. COMPARISON:  CT head 06/17/2022. FINDINGS: CT HEAD FINDINGS Brain: No acute intracranial hemorrhage. No CT evidence of acute infarct. Similar remote infarct in the left parietal lobe. Remote lacunar infarcts in the bilateral basal ganglia. Nonspecific hypoattenuation in the periventricular and subcortical white matter favored to reflect chronic microvascular ischemic changes. No edema, mass effect, or midline shift. The basilar cisterns are patent. Ventricles: The ventricles are normal. Vascular: Atherosclerotic calcifications of the carotid siphons. No hyperdense vessel. Skull: No acute or aggressive finding. Orbits: Orbits are symmetric. Sinuses: Mucosal thickening in the left maxillary sinus. Other: Mastoid air cells are clear. CT CERVICAL SPINE FINDINGS Alignment: Cervical lordosis is maintained. No significant listhesis. No facet subluxation or dislocation. Skull base and vertebrae: No acute fracture. No primary bone lesion or focal pathologic process. Soft tissues and spinal canal: No prevertebral fluid or swelling. No visible canal hematoma. Disc levels: Mild disc space narrowing at C6-7. No high-grade osseous spinal canal stenosis. Facet arthrosis at multiple levels in the cervical spine. No high-grade osseous foraminal stenosis.  Upper chest: Negative. Other: None. IMPRESSION: No CT evidence of acute intracranial abnormality. No acute fracture or traumatic malalignment of the cervical spine. Remote infarcts as above.  Chronic microvascular ischemic changes. Electronically Signed   By: Donnice Mania M.D.   On: 06/09/2023 11:07   DG Shoulder Right Result Date: 06/09/2023 CLINICAL DATA:  Fall.  Right shoulder pain. EXAM: RIGHT SHOULDER - 2+ VIEW COMPARISON:  None Available. FINDINGS: Acute mildly displaced fracture is seen involving the right humeral neck and extending through the greater tuberosity. No evidence of dislocation. IMPRESSION: Mildly displaced fracture through the right humeral neck and greater tuberosity. Electronically Signed   By: Norleen DELENA Kil M.D.   On: 06/09/2023 11:07    Procedures Procedures    Medications Ordered in ED Medications  acetaminophen  (TYLENOL ) tablet 650 mg (650 mg Oral Given 06/09/23 1126)  HYDROcodone -acetaminophen  (NORCO/VICODIN) 5-325 MG per tablet 1 tablet (1 tablet Oral Given 06/09/23 1126)  fentaNYL  (SUBLIMAZE ) injection 25 mcg (25 mcg Intravenous Given  06/09/23 1251)    ED Course/ Medical Decision Making/ A&P Clinical Course as of 06/09/23 1306  Thu Jun 09, 2023  1243 Spoke with ortho recommending sling and NWB with arm.  WB as tolerating w/ new hip fracture.  [JB]    Clinical Course User Index [JB] Nicha Hemann, Warren SAILOR, PA-C                                 Medical Decision Making Amount and/or Complexity of Data Reviewed Labs: ordered. Radiology: ordered.  Risk OTC drugs. Prescription drug management. Decision regarding hospitalization.   This patient presents to the ED for concern of fall, this involves an extensive number of treatment options, and is a complaint that carries with it a high risk of complications and morbidity.  The differential diagnosis includes intracranial abnormality, fracture, fracture, dislocation, urinary tract infection, URI, anemia, electrolyte  abnormality  Lab Tests:  I personally interpreted labs.  The pertinent results include:   CBC with mild leukocytosis which I feel is most likely reactive.  Hemoglobin is 9.6. BMP with creatinine of 2.5 which is at baseline for patient   Imaging Studies ordered:  I ordered imaging studies including CT head, neck x-ray of right hip and right shoulder I independently visualized and interpreted imaging which showed right humeral fracture and right pubic rami fracture.  I agree with the radiologist interpretation   Cardiac Monitoring: / EKG:  The patient was maintained on a cardiac monitor.    Consultations Obtained:  I requested consultation with the hospitalist,  and discussed lab and imaging findings as well as pertinent plan - they recommend: admit   Problem List / ED Course / Critical interventions / Medication management  Brought into emergency room today after mechanical fall.  At baseline she ambulates with walker.  She reports she did not feel dizzy or have near syncope prior to fall.  She does not think she hit her head and she does not have headache neck pain.  No midline tenderness.  Head appears atraumatic no open wounds.  She is hemodynamically stable and well-appearing.  She is not on blood thinner.  Will obtain CT of head and neck to rule out abnormality.  She has right shoulder pain will evaluate with x-ray.  She has right hip pain will evaluate with x-ray.  She has no focal deficit.  Family reports she has been acting at baseline. Patient has fracture to pubic rami and right humeral head.  I consulted orthopedics who recommended sling for right arm.  They do not feel this will be surgical.  Patient's pain is not well-controlled.  Will admit for pain control. I ordered medication including Norco, fentanyl  Reevaluation of the patient after these medicines showed that the patient improved I have reviewed the patients home medicines and have made adjustments as  needed   Plan Admit for pain control.          Final Clinical Impression(s) / ED Diagnoses Final diagnoses:  Closed fracture of head of right humerus, initial encounter  Closed fracture of multiple pubic rami, right, initial encounter (HCC)  Intractable pain    Rx / DC Orders ED Discharge Orders     None         Shermon Warren SAILOR, PA-C 06/09/23 1431    Levander [place], MD 06/09/23 1653    Trenesha Alcaide, Warren SAILOR, PA-C 07/05/23 1607    Levander [place], MD 07/08/23 1208

## 2023-06-09 NOTE — Progress Notes (Signed)
 Orthopedic Tech Progress Note Patient Details:  Chelsea [name] 08-27-1945 [id number]  Ortho Devices Type of Ortho Device: Arm sling Ortho Device/Splint Location: RUE Ortho Device/Splint Interventions: Ordered, Application, Adjustment   Post Interventions Patient Tolerated: Fair Instructions Provided: Care of device, Adjustment of device  Seniya Stoffers Crystal Dory 06/09/2023, 11:37 AM

## 2023-06-10 DIAGNOSIS — H5462 Unqualified visual loss, left eye, normal vision right eye: Secondary | ICD-10-CM | POA: Diagnosis not present

## 2023-06-10 DIAGNOSIS — Z8249 Family history of ischemic heart disease and other diseases of the circulatory system: Secondary | ICD-10-CM | POA: Diagnosis not present

## 2023-06-10 DIAGNOSIS — W010XXA Fall on same level from slipping, tripping and stumbling without subsequent striking against object, initial encounter: Secondary | ICD-10-CM | POA: Diagnosis present

## 2023-06-10 DIAGNOSIS — Z515 Encounter for palliative care: Secondary | ICD-10-CM | POA: Diagnosis not present

## 2023-06-10 DIAGNOSIS — N179 Acute kidney failure, unspecified: Secondary | ICD-10-CM | POA: Diagnosis not present

## 2023-06-10 DIAGNOSIS — I959 Hypotension, unspecified: Secondary | ICD-10-CM | POA: Diagnosis not present

## 2023-06-10 DIAGNOSIS — R52 Pain, unspecified: Secondary | ICD-10-CM | POA: Diagnosis present

## 2023-06-10 DIAGNOSIS — E1122 Type 2 diabetes mellitus with diabetic chronic kidney disease: Secondary | ICD-10-CM | POA: Diagnosis not present

## 2023-06-10 DIAGNOSIS — I69351 Hemiplegia and hemiparesis following cerebral infarction affecting right dominant side: Secondary | ICD-10-CM | POA: Diagnosis not present

## 2023-06-10 DIAGNOSIS — E611 Iron deficiency: Secondary | ICD-10-CM | POA: Diagnosis not present

## 2023-06-10 DIAGNOSIS — I5032 Chronic diastolic (congestive) heart failure: Secondary | ICD-10-CM | POA: Diagnosis not present

## 2023-06-10 DIAGNOSIS — I10 Essential (primary) hypertension: Secondary | ICD-10-CM

## 2023-06-10 DIAGNOSIS — N184 Chronic kidney disease, stage 4 (severe): Secondary | ICD-10-CM | POA: Diagnosis not present

## 2023-06-10 DIAGNOSIS — Z7401 Bed confinement status: Secondary | ICD-10-CM | POA: Diagnosis not present

## 2023-06-10 DIAGNOSIS — S32599A Other specified fracture of unspecified pubis, initial encounter for closed fracture: Secondary | ICD-10-CM | POA: Diagnosis present

## 2023-06-10 DIAGNOSIS — Z833 Family history of diabetes mellitus: Secondary | ICD-10-CM | POA: Diagnosis not present

## 2023-06-10 DIAGNOSIS — I2510 Atherosclerotic heart disease of native coronary artery without angina pectoris: Secondary | ICD-10-CM | POA: Diagnosis not present

## 2023-06-10 DIAGNOSIS — S32591A Other specified fracture of right pubis, initial encounter for closed fracture: Secondary | ICD-10-CM | POA: Diagnosis not present

## 2023-06-10 DIAGNOSIS — D631 Anemia in chronic kidney disease: Secondary | ICD-10-CM | POA: Diagnosis not present

## 2023-06-10 DIAGNOSIS — W19XXXA Unspecified fall, initial encounter: Secondary | ICD-10-CM

## 2023-06-10 DIAGNOSIS — N3289 Other specified disorders of bladder: Secondary | ICD-10-CM | POA: Diagnosis not present

## 2023-06-10 DIAGNOSIS — S42291A Other displaced fracture of upper end of right humerus, initial encounter for closed fracture: Secondary | ICD-10-CM | POA: Diagnosis not present

## 2023-06-10 DIAGNOSIS — E1165 Type 2 diabetes mellitus with hyperglycemia: Secondary | ICD-10-CM | POA: Diagnosis not present

## 2023-06-10 DIAGNOSIS — E1151 Type 2 diabetes mellitus with diabetic peripheral angiopathy without gangrene: Secondary | ICD-10-CM | POA: Diagnosis not present

## 2023-06-10 DIAGNOSIS — F32A Depression, unspecified: Secondary | ICD-10-CM | POA: Diagnosis not present

## 2023-06-10 DIAGNOSIS — Z66 Do not resuscitate: Secondary | ICD-10-CM | POA: Diagnosis not present

## 2023-06-10 DIAGNOSIS — Z7951 Long term (current) use of inhaled steroids: Secondary | ICD-10-CM | POA: Diagnosis not present

## 2023-06-10 DIAGNOSIS — E875 Hyperkalemia: Secondary | ICD-10-CM | POA: Diagnosis not present

## 2023-06-10 DIAGNOSIS — Y92009 Unspecified place in unspecified non-institutional (private) residence as the place of occurrence of the external cause: Secondary | ICD-10-CM | POA: Diagnosis not present

## 2023-06-10 DIAGNOSIS — E785 Hyperlipidemia, unspecified: Secondary | ICD-10-CM | POA: Diagnosis not present

## 2023-06-10 DIAGNOSIS — I130 Hypertensive heart and chronic kidney disease with heart failure and stage 1 through stage 4 chronic kidney disease, or unspecified chronic kidney disease: Secondary | ICD-10-CM | POA: Diagnosis not present

## 2023-06-10 DIAGNOSIS — J449 Chronic obstructive pulmonary disease, unspecified: Secondary | ICD-10-CM | POA: Diagnosis not present

## 2023-06-10 LAB — BASIC METABOLIC PANEL WITH GFR
Anion gap: 6 (ref 5–15)
BUN: 63 mg/dL — ABNORMAL HIGH (ref 8–23)
CO2: 24 mmol/L (ref 22–32)
Calcium: 8.3 mg/dL — ABNORMAL LOW (ref 8.9–10.3)
Chloride: 103 mmol/L (ref 98–111)
Creatinine, Ser: 2.74 mg/dL — ABNORMAL HIGH (ref 0.44–1.00)
GFR, Estimated: 17 mL/min — ABNORMAL LOW (ref >60)
Glucose, Bld: 182 mg/dL — ABNORMAL HIGH (ref 70–99)
Potassium: 5.1 mmol/L (ref 3.5–5.1)
Sodium: 133 mmol/L — ABNORMAL LOW (ref 135–145)

## 2023-06-10 LAB — CBC
HCT: 26.9 % — ABNORMAL LOW (ref 36.0–46.0)
Hemoglobin: 8.8 g/dL — ABNORMAL LOW (ref 12.0–15.0)
MCH: 31.9 pg (ref 26.0–34.0)
MCHC: 32.7 g/dL (ref 30.0–36.0)
MCV: 97.5 fL (ref 80.0–100.0)
Platelets: 204 10*3/uL (ref 150–400)
RBC: 2.76 MIL/uL — ABNORMAL LOW (ref 3.87–5.11)
RDW: 12.6 % (ref 11.5–15.5)
WBC: 8.7 10*3/uL (ref 4.0–10.5)
nRBC: 0 % (ref 0.0–0.2)

## 2023-06-10 LAB — MAGNESIUM: Magnesium: 2.4 mg/dL (ref 1.7–2.4)

## 2023-06-10 MED ORDER — IPRATROPIUM-ALBUTEROL 0.5-2.5 (3) MG/3ML IN SOLN: 3.0000 mL | RESPIRATORY_TRACT | Status: DC | PRN

## 2023-06-10 MED ORDER — SODIUM CHLORIDE 0.9 % IV BOLUS
500.0000 mL | Freq: Once | INTRAVENOUS | Status: AC
  Administered 2023-06-10: 500 mL via INTRAVENOUS

## 2023-06-10 MED ORDER — HYDROCODONE-ACETAMINOPHEN 5-325 MG PO TABS
1.0000 | ORAL_TABLET | Freq: Four times a day (QID) | ORAL | Status: DC | PRN
  Administered 2023-06-11 – 2023-06-14 (×4): 1 via ORAL
  Filled 2023-06-10 (×5): qty 1

## 2023-06-10 MED ORDER — POLYETHYLENE GLYCOL 3350 17 G PO PACK
17.0000 g | PACK | Freq: Every day | ORAL | Status: DC
  Administered 2023-06-10 – 2023-06-14 (×5): 17 g via ORAL
  Filled 2023-06-10 (×5): qty 1

## 2023-06-10 MED ORDER — ONDANSETRON HCL 4 MG/2ML IJ SOLN
4.0000 mg | Freq: Four times a day (QID) | INTRAMUSCULAR | Status: DC | PRN
  Administered 2023-06-10 – 2023-06-11 (×4): 4 mg via INTRAVENOUS
  Filled 2023-06-10 (×4): qty 2

## 2023-06-10 MED ORDER — SODIUM CHLORIDE 0.9 % IV SOLN: INTRAVENOUS | Status: DC

## 2023-06-10 MED ORDER — FLUTICASONE FUROATE-VILANTEROL 200-25 MCG/ACT IN AEPB
1.0000 | INHALATION_SPRAY | Freq: Every day | RESPIRATORY_TRACT | Status: DC
  Administered 2023-06-11 – 2023-06-14 (×4): 1 via RESPIRATORY_TRACT
  Filled 2023-06-10: qty 28

## 2023-06-10 MED ORDER — ACETAMINOPHEN 500 MG PO TABS
500.0000 mg | ORAL_TABLET | Freq: Four times a day (QID) | ORAL | Status: DC
  Administered 2023-06-10 – 2023-06-14 (×18): 500 mg via ORAL
  Filled 2023-06-10 (×18): qty 1

## 2023-06-10 MED ORDER — GLUCERNA SHAKE PO LIQD
237.0000 mL | Freq: Three times a day (TID) | ORAL | Status: DC
  Administered 2023-06-10 – 2023-06-14 (×9): 237 mL via ORAL

## 2023-06-10 MED ORDER — HYDROMORPHONE HCL 1 MG/ML IJ SOLN
0.5000 mg | INTRAMUSCULAR | Status: DC | PRN
  Administered 2023-06-10 – 2023-06-11 (×2): 0.5 mg via INTRAVENOUS
  Filled 2023-06-10 (×2): qty 0.5

## 2023-06-10 MED ORDER — METHOCARBAMOL 500 MG PO TABS
250.0000 mg | ORAL_TABLET | Freq: Three times a day (TID) | ORAL | Status: DC | PRN
  Administered 2023-06-10 – 2023-06-12 (×4): 250 mg via ORAL
  Filled 2023-06-10 (×4): qty 1

## 2023-06-10 MED ORDER — ACETAMINOPHEN 325 MG PO TABS
650.0000 mg | ORAL_TABLET | Freq: Four times a day (QID) | ORAL | Status: DC | PRN
  Administered 2023-06-11: 650 mg via ORAL
  Filled 2023-06-10: qty 2

## 2023-06-10 MED ORDER — FERROUS SULFATE 325 (65 FE) MG PO TABS
325.0000 mg | ORAL_TABLET | ORAL | Status: DC
  Administered 2023-06-10 – 2023-06-12 (×2): 325 mg via ORAL
  Filled 2023-06-10 (×3): qty 1

## 2023-06-10 MED ORDER — SENNOSIDES-DOCUSATE SODIUM 8.6-50 MG PO TABS
1.0000 | ORAL_TABLET | Freq: Two times a day (BID) | ORAL | Status: DC
  Administered 2023-06-10 – 2023-06-11 (×3): 1 via ORAL
  Filled 2023-06-10 (×3): qty 1

## 2023-06-10 NOTE — Evaluation (Signed)
 Physical Therapy Evaluation Patient Details Name: LAVORA BRISBON MRN: [id number] DOB: 07-29-1945 Today's Date: 06/10/2023  History of Present Illness  78 y.o. female [facility] with fall. Pt found to have superior and inferior pubic rami fx along with mildly displaced fx of the R humeral neck and greater tuberosity. PMH is significant for CVA w/ residual right sided weakness, cardiac arrest, carotid artery occlusion, normocytic anemia followed by hematology oncology, COPD, CKD stage IIIb, DM, HTN.   Clinical Impression  Prior to admission pt was mobilizing mod I with a RW and was independent for all ADLs. Pt presents to evaluation with limitations in strength, mobility, balance, and activity tolerance, all limiting patient's ability to mobilize near baseline. Mobility progression during session was limited secondary to pain and fatigue. Pt was able to stand EOB with moderate physical assistance of 2 and bilateral knee block; no signs of knee buckling observed although pt has history of R knee buckling. Pt would benefit from further bed mobility, transfer and gait training. Pt and pt's daughters were educated on bed level exercises (hip ABD, heel slides, ankle pumps). PT will continue to treat patient while she is admitted. Patient will benefit from continued inpatient follow up therapy, <3 hours/day       If plan is discharge home, recommend the following: A lot of help with walking and/or transfers;A lot of help with bathing/dressing/bathroom;Assist for transportation;Help with stairs or ramp for entrance;Supervision due to cognitive status   Can travel by private vehicle   No    Equipment Recommendations None recommended by PT  Recommendations for Other Services       Functional Status Assessment Patient has had a recent decline in their functional status and demonstrates the ability to make significant improvements in function in a reasonable and predictable  amount of time.     Precautions / Restrictions        Mobility  Bed Mobility Overal bed mobility: Needs Assistance Bed Mobility: Supine to Sit, Sit to Supine     Supine to sit: Max assist, +2 for physical assistance, HOB elevated, Used rails Sit to supine: Max assist, +2 for physical assistance, HOB elevated, Used rails   General bed mobility comments: Pt is max A +2 for all bed mobility. Pt is able to initiate left lower extremity advanacement towards edge of bed, but fatigues quickly and requires physical assistance.    Transfers Overall transfer level: Needs assistance Equipment used: 2 person hand held assist Transfers: Sit to/from Stand Sit to Stand: Mod assist, +2 physical assistance           General transfer comment: Pt completed STS from EOB w/ mod A +2 for power up and was able to stand w/ mod A +1. VC were given for sequencing and increasing trunk flexion prior to powering up. Increased time to complete.    Ambulation/Gait                  Stairs            Wheelchair Mobility     Tilt Bed    Modified Rankin (Stroke Patients Only)       Balance Overall balance assessment: Needs assistance Sitting-balance support: Bilateral upper extremity supported, Feet supported Sitting balance-Leahy Scale: Poor Sitting balance - Comments: Pt sat EOB w/ bilateral UE support and CGA for several minutes with slight posterior lean; pt is able to self-correct with VC. Postural control: Posterior lean Standing balance support: Bilateral upper  extremity supported, During functional activity, Reliant on assistive device for balance (reliant on external support (therapist)) Standing balance-Leahy Scale: Poor Standing balance comment: Pt is able to maintain standing at EOB w/ mod A and no notes of knee buckling or posterior losses of balance.                             Pertinent Vitals/Pain      Home Living Family/patient expects to be  discharged to:: Private residence Living Arrangements: Children Available Help at Discharge: Family;Available 24 hours/day Type of Home: House Home Access: Ramped entrance       Home Layout: Two level;Able to live on main level with bedroom/bathroom Home Equipment: Toilet riser;Wheelchair - Forensic psychologist (2 wheels);Cane - single point;BSC/3in1;Shower seat      Prior Function Prior Level of Function : Needs assist             Mobility Comments: ambulatory with RW, PRN use of wheelchair ADLs Comments: pt feeds herself and can bathe herself in the bed with setup of family     Extremity/Trunk Assessment   Upper Extremity Assessment Upper Extremity Assessment: RUE deficits/detail RUE: Unable to fully assess due to immobilization;Shoulder pain at rest    Lower Extremity Assessment Lower Extremity Assessment: RLE deficits/detail RLE Deficits / Details: decreased range of motion, decreased strength RLE: Unable to fully assess due to pain    Cervical / Trunk Assessment Cervical / Trunk Assessment: Kyphotic  Communication   Communication Communication: No apparent difficulties    Cognition                                 Following commands: Intact       Cueing Cueing Techniques: Verbal cues, Tactile cues, Visual cues     General Comments General comments (skin integrity, edema, etc.): 2L [place]; BP supine 133/66. no reports of dizziness or lightheadedness throughout session.    Exercises     Assessment/Plan    PT Assessment Patient needs continued PT services  PT Problem List Decreased strength;Decreased range of motion;Decreased activity tolerance;Decreased balance;Decreased mobility;Decreased cognition;Pain       PT Treatment Interventions DME instruction;Gait training;Functional mobility training;Therapeutic activities;Therapeutic exercise;Balance training;Neuromuscular re-education;Cognitive remediation;Patient/family education;Wheelchair  mobility training    PT Goals (Current goals can be found in the Care Plan section)  Acute Rehab PT Goals Patient Stated Goal: to be able to walk again PT Goal Formulation: With patient/family Time For Goal Achievement: 06/24/23 Potential to Achieve Goals: Fair    Frequency Min 3X/week     Co-evaluation               AM-PAC PT "6 Clicks" Mobility  Outcome Measure Help needed turning from your back to your side while in a flat bed without using bedrails?: A Lot Help needed moving from lying on your back to sitting on the side of a flat bed without using bedrails?: Total Help needed moving to and from a bed to a chair (including a wheelchair)?: Total Help needed standing up from a chair using your arms (e.g., wheelchair or bedside chair)?: A Lot Help needed to walk in hospital room?: Total Help needed climbing 3-5 steps with a railing? : Total 6 Click Score: 8    End of Session Equipment Utilized During Treatment: Gait belt Activity Tolerance: Patient limited by fatigue;Patient limited by pain Patient left: in bed;with call bell/phone  within reach;with bed alarm set;with family/visitor present Nurse Communication: Mobility status;Patient requests pain meds PT Visit Diagnosis: Muscle weakness (generalized) (M62.81);Pain;History of falling (Z91.81);Unsteadiness on feet (R26.81) Pain - Right/Left: Right Pain - part of body: Shoulder;Hip    Time: 1610-9604 PT Time Calculation (min) (ACUTE ONLY): 45 min   Charges:   PT Evaluation $PT Eval Low Complexity: 1 Low PT Treatments $Therapeutic Activity: 8-22 mins PT General Charges $$ ACUTE PT VISIT: 1 Visit         Lonell Rives, SPT Acute Rehab [phone]   Lonell Rives 06/10/2023, 6:05 PM

## 2023-06-10 NOTE — Hospital Course (Addendum)
 PMH of CVA with residual right-sided weakness, CAD, PAD, depression, HLD, HTN presented to the hospital with a mechanical fall.  She was working with her walker emptying her body and lost her balance and fell down. Sustained fracture of her right humerus as well as right pubic rami. Was treated for pain control and AKI and hypotension.  Assessment and Plan: Right pubic rami fracture Right humerus fracture. Orthopedic were consulted in the ED. Current recommend pain control. PT OT recommended SNF. WBAT for the lower extremity.  Nonweightbearing for the right upper extremity. No surgery recommended by orthopedic. Follow up In 2 weeks   Hypotension. History of essential hypertension On admission blood pressure was soft. Now blood pressure is normal.  Hold Benicar 10 mg, torsemide 40 mg. Continue Norvasc  and carvedilol  3.125 mg.  Torsemide as needed.   AKI on CKD stage IV. Baseline serum creatinine around 2-2.3.  On admission serum creatinine was 2.7.   Ultrasound renal negative for any obstruction. Now renal function improved.    Hyperkalemia. Potassium level elevated. Treated with Lokelma .  Monitor.  Anemia likely from chronic disease as well as dilutional. Iron deficiency Hemoglobin was 10 at baseline.  Hemoglobin was 9.6 on admission.  Initially stabilized at 8.  Now dropped down to 7.3. No significant bleeding. The drop is mostly dilutional in nature. Replace IV iron and vitamins.  Diabetes mellitus, uncontrolled with hyperglycemia without long-term insulin  use with nephropathy. Hemoglobin A1c 8.1. Sliding scale insulin .  Monitor.  CAD. CVA. Residual right-sided weakness. HLD On Plavix  at home. As well as Crestor .  Continue.  History of recurrent UTI. Patient's urologist started her on Flomax . Currently on hold.  Chronic HFpEF. On torsemide at home. Volume status is adequate for now.  Switch to PRN only.   Goals of care conversation. Extensive discussion  with daughter at bedside. Daughter asked me about patient's prognosis. Patient has stroke with residual right-sided weakness although she was fairly independent with a walker to move around. Now secondary to pubic rami fracture she is unable to ambulate much. This condition is worsened with a right humerus fracture as she is not able to bear any weight on that. Combination can very well lead to poor prognosis for the patient. prognosis is poor with multiple fractures and prior history of stroke with CKD. Goal currently is to consider SNF for rehabilitation and see how she progresses.  With outpatient ambulatory palliative care. DNR  Constipation. Resolved.  Cough. Chest x-ray performed.  Retrocardiac opacity.  Continue incentive spirometer. Continue flutter valve as well.

## 2023-06-10 NOTE — TOC CAGE-AID Note (Signed)
 Transition of Care [facility]) - CAGE-AID Screening   Patient Details  Name: Chelsea [name] MRN: [id number] Date of Birth: 1945/07/22  Transition of [facility]) CM/SW Contact:    Lanice Folden E Biff Rutigliano, LCSW Phone Number: 06/10/2023, 9:22 AM   Clinical Narrative:    CAGE-AID Screening:    Have You Ever Felt You Ought to Cut Down on Your Drinking or Drug Use?: No Have People Annoyed You By [employer] Your Drinking Or Drug Use?: No Have You Felt Bad Or Guilty About Your Drinking Or Drug Use?: No Have You Ever Had a Drink or Used Drugs First Thing In The Morning to Steady Your Nerves or to Get Rid of a Hangover?: No CAGE-AID Score: 0  Substance Abuse Education Offered: No

## 2023-06-10 NOTE — Progress Notes (Signed)
 [facility] Progress Note Patient: Chelsea [name] ZOX:[id number] DOB: [birth year] DOA: 06/09/2023  DOS: the patient was seen and examined on 06/10/2023  Brief Hospital Course: PMH of CVA with residual right-sided weakness, CAD, PAD, depression, HLD, HTN presented to the hospital with a mechanical fall.  She was working with her walker emptying her body and lost her balance and fell down. Sustained fracture of her right humerus as well as right pubic rami. Currently being treated for pain control and AKI and hypotension.  Assessment and Plan: Right pubic rami fracture Right humerus fracture. Orthopedic were consulted in the ED. Current recommend pain control. PT OT consulted. Will follow recommendation. No surgery recommended by orthopedic.  Hypotension. Blood pressure has been soft. Which is not consistent with uncontrolled pain which is patient's primary complaint. Holding her antihypertensive medication.  Aggressively treated with IV fluid.  Monitor.  AKI on CKD stage IV. Baseline serum creatinine around 2-2.3.  On admission serum creatinine was 2.7.  Aggressively treating with IV fluid.  Monitor.  Anemia likely from chronic disease as well as dilutional. Hemoglobin was 10 at baseline.  Hemoglobin was 9.6 on admission.  Currently 8.8. The drop is mostly dilutional in nature. For now we will monitor.  Diabetes mellitus, uncontrolled with hyperglycemia without long-term insulin  use with nephropathy. Hemoglobin A1c 8.1. Sliding scale insulin .  Monitor.  CAD. CVA. Residual right-sided weakness. HLD On Plavix  at home.  As well as Crestor .  Continue.  History of recurrent UTI. Patient's urologist started her on Flomax .  Currently on hold.  HTN. HFpEF. On torsemide at home.  Volume status is adequate for now.  At risk for volume overload.  Monitor.    Subjective: Pain uncontrolled.  No nausea no vomiting no fever no chills.  Denies any dizziness.  Denies any new focal  deficit.  Physical Exam: General: in Mild distress, No Rash Cardiovascular: S1 and S2 Present, No Murmur Respiratory: Good respiratory effort, Bilateral Air entry present.  Faint basal crackles, No wheezes Abdomen: Bowel Sound present, No tenderness Extremities: Trace edema Neuro: Alert and oriented x3, no new focal deficit  Data Reviewed: I have Reviewed nursing notes, Vitals, and Lab results. Since last encounter, pertinent lab results CBC and BMP   . I have ordered test including CBC and BMP  .   Disposition: Status is: Inpatient Remains inpatient appropriate because: Monitor renal function  Place and maintain sequential compression device Start: 06/10/23 0823 Place TED hose Start: 06/10/23 0823   Family Communication: Family at bedside Level of care: Med-Surg   Vitals:   06/10/23 0500 06/10/23 0748 06/10/23 0900 06/10/23 1736  BP: (!) 108/47 (!) 86/56 (!) 114/47 (!) 156/92  Pulse: 60 62  70  Resp: 15 18  19   Temp: 98.9 F (37.2 C)     TempSrc: Oral     SpO2: 100% 100%  100%  Weight:      Height:         Author: [name], MD 06/10/2023 7:17 PM  Please look on [email or web address] to find out who is on call.

## 2023-06-11 ENCOUNTER — Inpatient Hospital Stay (HOSPITAL_COMMUNITY)

## 2023-06-11 DIAGNOSIS — I10 Essential (primary) hypertension: Secondary | ICD-10-CM | POA: Diagnosis not present

## 2023-06-11 DIAGNOSIS — Y92009 Unspecified place in unspecified non-institutional (private) residence as the place of occurrence of the external cause: Secondary | ICD-10-CM | POA: Diagnosis not present

## 2023-06-11 DIAGNOSIS — W19XXXA Unspecified fall, initial encounter: Secondary | ICD-10-CM | POA: Diagnosis not present

## 2023-06-11 LAB — CBC
HCT: 24.5 % — ABNORMAL LOW (ref 36.0–46.0)
Hemoglobin: 8 g/dL — ABNORMAL LOW (ref 12.0–15.0)
MCH: 32.1 pg (ref 26.0–34.0)
MCHC: 32.7 g/dL (ref 30.0–36.0)
MCV: 98.4 fL (ref 80.0–100.0)
Platelets: 176 10*3/uL (ref 150–400)
RBC: 2.49 MIL/uL — ABNORMAL LOW (ref 3.87–5.11)
RDW: 12.5 % (ref 11.5–15.5)
WBC: 8.4 10*3/uL (ref 4.0–10.5)
nRBC: 0 % (ref 0.0–0.2)

## 2023-06-11 LAB — BASIC METABOLIC PANEL WITH GFR
Anion gap: 8 (ref 5–15)
BUN: 58 mg/dL — ABNORMAL HIGH (ref 8–23)
CO2: 22 mmol/L (ref 22–32)
Calcium: 8.2 mg/dL — ABNORMAL LOW (ref 8.9–10.3)
Chloride: 106 mmol/L (ref 98–111)
Creatinine, Ser: 2.66 mg/dL — ABNORMAL HIGH (ref 0.44–1.00)
GFR, Estimated: 18 mL/min — ABNORMAL LOW (ref >60)
Glucose, Bld: 168 mg/dL — ABNORMAL HIGH (ref 70–99)
Potassium: 5.2 mmol/L — ABNORMAL HIGH (ref 3.5–5.1)
Sodium: 136 mmol/L (ref 135–145)

## 2023-06-11 LAB — MAGNESIUM: Magnesium: 2.4 mg/dL (ref 1.7–2.4)

## 2023-06-11 MED ORDER — SENNOSIDES-DOCUSATE SODIUM 8.6-50 MG PO TABS
2.0000 | ORAL_TABLET | Freq: Two times a day (BID) | ORAL | Status: DC
  Administered 2023-06-11 – 2023-06-12 (×2): 2 via ORAL
  Filled 2023-06-11 (×2): qty 2

## 2023-06-11 MED ORDER — SODIUM ZIRCONIUM CYCLOSILICATE 10 G PO PACK
10.0000 g | PACK | Freq: Every day | ORAL | Status: DC
  Administered 2023-06-11 – 2023-06-12 (×2): 10 g via ORAL
  Filled 2023-06-11 (×2): qty 1

## 2023-06-11 NOTE — NC FL2 (Signed)
 [place]  MEDICAID FL2 LEVEL OF CARE FORM     IDENTIFICATION  Patient Name: Chelsea [name] Birthdate: 01/14/1945 Sex: female Admission Date (Current Location): 06/09/2023  [facility] and [place] Number:  Producer, television/film/video and Address:  [facility], [address], [place], [place] [address]      Provider Number: [id number]  Attending Physician Name and Address:  Kraig Peru, MD  Relative Name and Phone Number:  Allen Israel [phone]    Current Level of Care: Hospital Recommended Level of Care: Skilled Nursing Facility Prior Approval Number:    Date Approved/Denied:   PASRR Number: [id number]  Discharge Plan: SNF    Current Diagnoses: Patient Active Problem List   Diagnosis Date Noted   Closed fracture of multiple pubic rami (HCC) 06/10/2023   Fall at home, initial encounter 06/09/2023   Anemia 08/13/2022   Ileus (HCC) 06/20/2022   Hyperkalemia 06/14/2022   PVD (peripheral vascular disease) (HCC) 06/13/2022   Wheezing 06/13/2022   Hip fracture, right, closed, initial encounter (HCC) 06/12/2022   Fall 06/12/2022   Hyponatremia 10/07/2020   AKI (acute kidney injury) on CKD IV 10/07/2020   Acute kidney injury superimposed on CKD (HCC) 10/07/2020   Dehydration with hyponatremia    Syncope 10/06/2020   History of CVA (cerebrovascular accident) 07/10/2020   Pure hypercholesterolemia 07/10/2020   Type 2 diabetes mellitus with complication, without long-term current use of insulin  (HCC) 07/10/2020   Malnutrition of moderate degree 06/27/2020   CKD (chronic kidney disease), stage IV (HCC) 06/25/2020   CAP (community acquired pneumonia) 06/25/2020   Stenosis of left carotid artery 12/03/2019   Stroke [facility])    Pain    Acute ischemic right posterior cerebral artery (PCA) stroke (HCC) 11/09/2019   Essential hypertension    Controlled type 2 diabetes mellitus with hyperglycemia (HCC)    Postherpetic neuralgia    Controlled type 2  diabetes mellitus with hyperglycemia, without long-term current use of insulin  (HCC) 11/07/2019   Hypertensive urgency 11/07/2019   Stenosis of left internal carotid artery 11/07/2019   Intractable nausea and vomiting 11/07/2019   Nicotine dependence, cigarettes, uncomplicated 11/07/2019   HZV (herpes zoster virus) post herpetic neuralgia 11/07/2019   Weakness of right lower extremity 11/07/2019   CVA (cerebral vascular accident) (HCC) 11/07/2019    Orientation RESPIRATION BLADDER Height & Weight     Self, Time, Situation, Place  O2 ([place] 2L) Continent, External catheter Weight: 165 lb (74.8 kg) Height:  5\' 7"  (170.2 cm)  BEHAVIORAL SYMPTOMS/MOOD NEUROLOGICAL BOWEL NUTRITION STATUS      Continent Diet (see dc summary)  AMBULATORY STATUS COMMUNICATION OF NEEDS Skin   Extensive Assist Verbally Normal                       Personal Care Assistance Level of Assistance  Bathing, Feeding, Dressing Bathing Assistance: Limited assistance Feeding assistance: Independent Dressing Assistance: Limited assistance     Functional Limitations Info  Sight, Hearing, Speech Sight Info: Adequate Hearing Info: Adequate Speech Info: Adequate    SPECIAL CARE FACTORS FREQUENCY  PT (By licensed PT), OT (By licensed OT)     PT Frequency: 5x week OT Frequency: 5x week            Contractures Contractures Info: Not present    Additional Factors Info  Code Status, Allergies Code Status Info: DNR Allergies Info: Codeine  Latex  Penicillins  Sulfa Antibiotics  Atorvastatin   Benzodiazepines  Cephalosporins  Gabapentin  Lidocaine   Oxycodone-acetaminophen   Pregabalin   Trazodone           Current Medications (06/11/2023):  This is the current hospital active medication list Current Facility-Administered Medications  Medication Dose Route Frequency Provider Last Rate Last Admin   0.9 %  sodium chloride  infusion   Intravenous Continuous Patel, Pranav M, MD 100 mL/hr at 06/10/23 2019 New Bag  at 06/10/23 2019   acetaminophen  (TYLENOL ) tablet 500 mg  500 mg Oral QID Patel, Pranav M, MD   500 mg at 06/10/23 2050   acetaminophen  (TYLENOL ) tablet 650 mg  650 mg Oral Q6H PRN Patel, Pranav M, MD   650 mg at 06/11/23 0455   clopidogrel  (PLAVIX ) tablet 75 mg  75 mg Oral Daily Opyd, Timothy S, MD   75 mg at 06/10/23 0901   feeding supplement (GLUCERNA SHAKE) (GLUCERNA SHAKE) liquid 237 mL  237 mL Oral TID BM Patel, Pranav M, MD   237 mL at 06/10/23 1404   ferrous sulfate  tablet 325 mg  325 mg Oral QODAY Patel, Pranav M, MD   325 mg at 06/10/23 1401   fluticasone  furoate-vilanterol (BREO ELLIPTA ) 200-25 MCG/ACT 1 puff  1 puff Inhalation Daily Patel, Pranav M, MD   1 puff at 06/11/23 1610   HYDROcodone -acetaminophen  (NORCO/VICODIN) 5-325 MG per tablet 1 tablet  1 tablet Oral Q6H PRN Patel, Pranav M, MD       HYDROmorphone  (DILAUDID ) injection 0.5 mg  0.5 mg Intravenous Q4H PRN Patel, Pranav M, MD   0.5 mg at 06/10/23 1801   ipratropium-albuterol  (DUONEB) 0.5-2.5 (3) MG/3ML nebulizer solution 3 mL  3 mL Inhalation Q4H PRN Patel, Pranav M, MD       methocarbamol  (ROBAXIN ) tablet 250 mg  250 mg Oral Q8H PRN Patel, Pranav M, MD   250 mg at 06/10/23 1418   ondansetron  (ZOFRAN ) injection 4 mg  4 mg Intravenous Q6H PRN Patel, Pranav M, MD   4 mg at 06/10/23 2055   polyethylene glycol (MIRALAX  / GLYCOLAX ) packet 17 g  17 g Oral Daily Patel, Pranav M, MD   17 g at 06/10/23 1401   rosuvastatin  (CRESTOR ) tablet 5 mg  5 mg Oral Daily Opyd, Timothy S, MD   5 mg at 06/10/23 0901   senna-docusate (Senokot-S) tablet 1 tablet  1 tablet Oral BID Patel, Pranav M, MD   1 tablet at 06/10/23 2051   sodium zirconium cyclosilicate  (LOKELMA ) packet 10 g  10 g Oral Daily Patel, Pranav M, MD         Discharge Medications: Please see discharge summary for a list of discharge medications.  Relevant Imaging Results:  Relevant Lab Results:   Additional Information    Bettejane Leavens B Zykeem Bauserman, LCSWA

## 2023-06-11 NOTE — Progress Notes (Signed)
 [facility] Progress Note Patient: Chelsea [name] ZOX:[id number] DOB: 1945-04-14 DOA: 06/09/2023  DOS: the patient was seen and examined on 06/11/2023  Brief Hospital Course: PMH of CVA with residual right-sided weakness, CAD, PAD, depression, HLD, HTN presented to the hospital with a mechanical fall.  She was working with her walker emptying her body and lost her balance and fell down. Sustained fracture of her right humerus as well as right pubic rami. Currently being treated for pain control and AKI and hypotension.  Assessment and Plan: Right pubic rami fracture Right humerus fracture. Orthopedic were consulted in the ED. Current recommend pain control. PT OT consulted.  WBAT for the lower extremity.  Nonweightbearing for the right upper extremity. Will follow recommendation. No surgery recommended by orthopedic.  Hypotension. Blood pressure has been soft. Which is not consistent with uncontrolled pain which is patient's primary complaint. Holding her antihypertensive medication.  Aggressively treated with IV fluid.  Monitor.  AKI on CKD stage IV. Baseline serum creatinine around 2-2.3.  On admission serum creatinine was 2.7.  Aggressively treating with IV fluid.  Monitor. Ultrasound renal shows bladder distention.  Bladder scan after voiding to 48.  Monitor  Hyperkalemia. Potassium level elevated.  Initiating Lokelma .  Monitor.  Anemia likely from chronic disease as well as dilutional. Hemoglobin was 10 at baseline.  Hemoglobin was 9.6 on admission.  Currently 8.8. The drop is mostly dilutional in nature. For now we will monitor.  Diabetes mellitus, uncontrolled with hyperglycemia without long-term insulin  use with nephropathy. Hemoglobin A1c 8.1. Sliding scale insulin .  Monitor.  CAD. CVA. Residual right-sided weakness. HLD On Plavix  at home.  As well as Crestor .  Continue.  History of recurrent UTI. Patient's urologist started her on Flomax .  Currently on  hold.  HTN. HFpEF. On torsemide at home.  Volume status is adequate for now.  At risk for volume overload.  Monitor.   Goals of care conversation. Extensive discussion with daughter at bedside. Daughter reports that she works in hospice. She reports that she was on hospice in the past.  Currently on palliative care only. Daughter asked me about patient's prognosis. Patient has stroke with residual right-sided weakness although she was fairly independent with a walker to move around. Now secondary to pubic rami fracture she is unable to ambulate much. This condition is worsened with a right humerus fracture as she is not able to bear any weight on that. Combination can very well lead to poor prognosis for the patient. Patient appears to be significantly drowsy with pain medication but also suffering from pain and daughter attempting to prolong the interval between the pain medications. Daughter wants to focus on comfort for the patient but wants to discuss with the patient. Patient wants to go home. Patient was seen by palliative care in 2024.  Will reconsult palliative care. Daughter will be talking with the patient and will be discussing with me about their final decision.  Although at present given current's presentation hospice care would be appropriate as prognosis is poor with multiple fractures and prior history of stroke with CKD.  Constipation. Initiating aggressive bowel regimen.  Monitor.  Cough. Chest x-ray performed. Report is still pending although on my evaluation I do not see any volume overload.  There is an opacity bilaterally concerning for possible infiltrate. Given that currently she does not have any fever or leukocytosis will monitor clinically although low threshold to initiate antibiotic if she starts having symptoms of infection.   Subjective: No nausea no vomiting  no fever no chills.  Pain still present but pain medication not helpful.  Physical  Exam: General: in Mild distress, No Rash Cardiovascular: S1 and S2 Present, No Murmur Respiratory: Good respiratory effort, Bilateral Air entry present. No Crackles, No wheezes Abdomen: Bowel Sound present, No tenderness Extremities: No edema Neuro: Alert and oriented x3, no new focal deficit  Data Reviewed: I have Reviewed nursing notes, Vitals, and Lab results. Since last encounter, pertinent lab results CBC and BMP   . I have ordered test including CBC and BMP  .  Ordered chest x-ray and reviewed independently visualized.  Disposition: Status is: Inpatient Remains inpatient appropriate because: Need further clarity and goal of care as well as therapy.  Place and maintain sequential compression device Start: 06/10/23 0823 Place TED hose Start: 06/10/23 0823   Family Communication: Family at bedside Level of care: Med-Surg   Vitals:   06/11/23 1142 06/11/23 1212 06/11/23 1538 06/11/23 1621  BP:    (!) 165/58  Pulse:    67  Resp: 20 15 16    Temp:    98.2 F (36.8 C)  TempSrc:    Oral  SpO2:    100%  Weight:      Height:         Author: [name], MD 06/11/2023 7:34 PM  Please look on [email or web address] to find out who is on call.

## 2023-06-11 NOTE — Progress Notes (Addendum)
 OT Cancellation Note  Patient Details Name: Chelsea [name] MRN: [id number] DOB: 05/31/1945   Cancelled Treatment:    Reason Eval/Treat Not Completed: Patient at procedure or test/ unavailable. OT arrived for eval, per RN pt off unit at ultrasound. Will return as able to.   Vivaan Helseth C, OT  Acute Rehabilitation Services Office [phone] Secure chat preferred   Mickael Alamo 06/11/2023, 10:05 AM

## 2023-06-11 NOTE — TOC Initial Note (Signed)
 Transition of Care [facility]) - Initial/Assessment Note    Patient Details  Name: Chelsea [name] MRN: [id number] Date of Birth: 08/16/1945  [facility]) CM/SW Contact:    Valley Gavia, LCSWA Phone Number: 06/11/2023, 9:08 AM  Clinical Narrative:                  CSW spoke with pt's daughter Oneda Big, explained SNF recs, she is agreeable, would like to have her mother placed at [facility] as she has been there in the past, otherwise she would like her to be placed closer to [facility], CSW explained insurance auth process and Medicare.gov ratings list.         Patient Goals and CMS Choice            Expected Discharge Plan and Services                                              Prior Living Arrangements/Services                       Activities of Daily Living      Permission Sought/Granted                  Emotional Assessment              Admission diagnosis:  Intractable pain [R52] Closed fracture of head of right humerus, initial encounter [S42.291A] Fall at home, initial encounter [W19.XXXA, Y92.009] Closed fracture of multiple pubic rami, right, initial encounter (HCC) [S32.591A] Closed fracture of multiple pubic rami (HCC) [S32.599A] Patient Active Problem List   Diagnosis Date Noted   Closed fracture of multiple pubic rami (HCC) 06/10/2023   Fall at home, initial encounter 06/09/2023   Anemia 08/13/2022   Ileus (HCC) 06/20/2022   Hyperkalemia 06/14/2022   PVD (peripheral vascular disease) (HCC) 06/13/2022   Wheezing 06/13/2022   Hip fracture, right, closed, initial encounter (HCC) 06/12/2022   Fall 06/12/2022   Hyponatremia 10/07/2020   AKI (acute kidney injury) on CKD IV 10/07/2020   Acute kidney injury superimposed on CKD (HCC) 10/07/2020   Dehydration with hyponatremia    Syncope 10/06/2020   History of CVA (cerebrovascular accident) 07/10/2020   Pure hypercholesterolemia 07/10/2020   Type 2  diabetes mellitus with complication, without long-term current use of insulin  (HCC) 07/10/2020   Malnutrition of moderate degree 06/27/2020   CKD (chronic kidney disease), stage IV (HCC) 06/25/2020   CAP (community acquired pneumonia) 06/25/2020   Stenosis of left carotid artery 12/03/2019   Stroke [facility])    Pain    Acute ischemic right posterior cerebral artery (PCA) stroke (HCC) 11/09/2019   Essential hypertension    Controlled type 2 diabetes mellitus with hyperglycemia (HCC)    Postherpetic neuralgia    Controlled type 2 diabetes mellitus with hyperglycemia, without long-term current use of insulin  (HCC) 11/07/2019   Hypertensive urgency 11/07/2019   Stenosis of left internal carotid artery 11/07/2019   Intractable nausea and vomiting 11/07/2019   Nicotine dependence, cigarettes, uncomplicated 11/07/2019   HZV (herpes zoster virus) post herpetic neuralgia 11/07/2019   Weakness of right lower extremity 11/07/2019   CVA (cerebral vascular accident) (HCC) 11/07/2019   PCP:  Joenathan Muslim, FNP Pharmacy:   [facility] [place], [place] - [address]  [place] [address] Phone: [phone] Fax: [phone]  [facility] [address] [place] [address] Phone: [phone] Fax: [phone]     Social Drivers of Health (SDOH) Social History: SDOH Screenings   Food Insecurity: No Food Insecurity (06/10/2023)  Housing: Patient Unable To Answer (06/10/2023)  Transportation Needs: No Transportation Needs (06/10/2023)  Utilities: Not At Risk (06/10/2023)  Depression (PHQ2-9): Low Risk  (07/29/2022)  Social Connections: Unknown (06/10/2023)  Tobacco Use: Medium Risk (06/09/2023)   SDOH Interventions:     Readmission Risk Interventions     No data to display

## 2023-06-11 NOTE — Plan of Care (Signed)

## 2023-06-11 NOTE — Plan of Care (Signed)
   Problem: Education: Goal: Knowledge of General Education information will improve Description: Including pain rating scale, medication(s)/side effects and non-pharmacologic comfort measures Outcome: Completed/Met

## 2023-06-11 NOTE — Evaluation (Signed)
 Occupational Therapy Evaluation Patient Details Name: Chelsea [name] MRN: [id number] DOB: [birth year] Today's Date: 06/11/2023   History of Present Illness   78 y.o. female presents to [facility] hospital on 06/09/2023 with fall. Pt found to have superior and inferior pubic rami fx along with mildly displaced fx of the R humeral neck and greater tuberosity. PMH is significant for CVA w/ residual right sided weakness, cardiac arrest, carotid artery occlusion, normocytic anemia followed by hematology oncology, COPD, CKD stage IIIb, DM, HTN.     Clinical Impressions Pt admitted based on above, and was seen based on problem list below. PTA pt was living with her daughter and was independent with most ADLs with her daughter assisting with showers. Today pt is requiring set up  to total +2 assist for ADLs. Bed mobility and functional transfers are  max +2 assist. Pain during hip flexion is limiting pt's ability to progress LB ADLs. Pt's family present for session. Education provided on don/doff sling as well as completing ADLs in sling. Pt with good family support available at d/c but would greatly benefit from  <3 hours of skilled rehab daily. OT will continue to follow acutely to maximize functional independence.        If plan is discharge home, recommend the following:   Two people to help with walking and/or transfers;A lot of help with bathing/dressing/bathroom     Functional Status Assessment   Patient has had a recent decline in their functional status and demonstrates the ability to make significant improvements in function in a reasonable and predictable amount of time.     Equipment Recommendations   Other (comment) (Defer to next venue)     Recommendations for Other Services         Precautions/Restrictions   Precautions Precautions: Fall Recall of Precautions/Restrictions: Intact Required Braces or Orthoses: Sling Restrictions Weight Bearing Restrictions Per Provider  Order: No RUE Weight Bearing Per Provider Order: Non weight bearing RLE Weight Bearing Per Provider Order: Weight bearing as tolerated     Mobility Bed Mobility Overal bed mobility: Needs Assistance Bed Mobility: Supine to Sit     Supine to sit: Max assist, +2 for physical assistance, HOB elevated, Used rails     General bed mobility comments: Pt assisting in guiding legs off bed and raising trunk but overall max +2    Transfers Overall transfer level: Needs assistance Equipment used: 2 person hand held assist Transfers: Sit to/from Stand, Bed to chair/wheelchair/BSC Sit to Stand: Max assist, +2 physical assistance, +2 safety/equipment Stand pivot transfers: Max assist, +2 physical assistance, +2 safety/equipment         General transfer comment: Cues for seuquencing, difficulty guiding legs for transfer      Balance Overall balance assessment: Needs assistance Sitting-balance support: Bilateral upper extremity supported, Feet supported Sitting balance-Leahy Scale: Fair Sitting balance - Comments: requires single UE support   Standing balance support: Single extremity supported, During functional activity, Reliant on assistive device for balance Standing balance-Leahy Scale: Poor Standing balance comment: Reliant on external support     ADL either performed or assessed with clinical judgement   ADL Overall ADL's : Needs assistance/impaired Eating/Feeding: Set up;Sitting   Grooming: Set up;Sitting   Upper Body Bathing: Minimal assistance;Sitting   Lower Body Bathing: Total assistance;+2 for physical assistance;Sit to/from stand Lower Body Bathing Details (indicate cue type and reason): Pt pain with hinging at hips, unable to reach  BLEs Upper Body Dressing : Minimal assistance;Sitting   Lower Body Dressing:  Total assistance;+2 for physical assistance;Sit to/from stand   Toilet Transfer: Maximal assistance;Stand-pivot;+2 for physical assistance;+2 for  safety/equipment Toilet Transfer Details (indicate cue type and reason): +2 HH assist, simulated in room Toileting- Clothing Manipulation and Hygiene: Total assistance;+2 for physical assistance;Sit to/from stand       Functional mobility during ADLs: Maximal assistance;+2 for physical assistance;+2 for safety/equipment General ADL Comments: Pt with pain with hinging at hips limiting ability to complete LB ADLs     Vision Baseline Vision/History: 0 No visual deficits Vision Assessment?: No apparent visual deficits            Pertinent Vitals/Pain Pain Assessment Pain Assessment: Faces Faces Pain Scale: Hurts even more Pain Location: R shoulder Pain Descriptors / Indicators: Aching Pain Intervention(s): Repositioned     Extremity/Trunk Assessment Upper Extremity Assessment Upper Extremity Assessment: RUE deficits/detail RUE Deficits / Details: R humerus fx, hand and wrist WFL, decreased grip strength residual from past CVA RUE: Shoulder pain at rest;Unable to fully assess due to immobilization RUE Sensation: decreased light touch RUE Coordination: decreased fine motor   Lower Extremity Assessment Lower Extremity Assessment: Defer to PT evaluation   Cervical / Trunk Assessment Cervical / Trunk Assessment: Kyphotic   Communication Communication Communication: No apparent difficulties   Cognition Arousal: Alert Behavior During Therapy: WFL for tasks assessed/performed Cognition: No apparent impairments       Following commands: Intact       Cueing  General Comments   Cueing Techniques: Verbal cues;Tactile cues;Visual cues  Daughters present for session, educated on don/doff sling           Home Living Family/patient expects to be discharged to:: Private residence Living Arrangements: Children Available Help at Discharge: Family;Available 24 hours/day Type of Home: House Home Access: Ramped entrance     Home Layout: Two level;Able to live on main level  with bedroom/bathroom     Bathroom Shower/Tub: Chief Strategy Officer: Standard Bathroom Accessibility: Yes How Accessible: Accessible via walker Home Equipment: Toilet riser;Wheelchair - Forensic psychologist (2 wheels);Cane - single point;BSC/3in1;Shower seat          Prior Functioning/Environment Prior Level of Function : Needs assist         Mobility Comments: ambulatory with RW, PRN use of wheelchair ADLs Comments: Pt completing most ADLs herself, with set up assist. Daughter assists with showers 2x a week    OT Problem List: Decreased strength;Decreased range of motion;Decreased activity tolerance;Impaired balance (sitting and/or standing);Decreased safety awareness;Pain;Impaired UE functional use   OT Treatment/Interventions: Therapeutic exercise;Self-care/ADL training;Energy conservation;DME and/or AE instruction;Therapeutic activities;Patient/family education;Balance training      OT Goals(Current goals can be found in the care plan section)   Acute Rehab OT Goals Patient Stated Goal: To get better OT Goal Formulation: With patient Time For Goal Achievement: 06/25/23 Potential to Achieve Goals: Good ADL Goals Pt Will Perform Upper Body Dressing: with set-up;sitting Pt Will Perform Lower Body Dressing: with max assist;sit to/from stand Additional ADL Goal #1: Pt's family will don/doff sling independently to assist in UB dressing   OT Frequency:  Min 2X/week       AM-PAC OT "6 Clicks" Daily Activity     Outcome Measure Help from another person eating meals?: A Little Help from another person taking care of personal grooming?: A Little Help from another person toileting, which includes using toliet, bedpan, or urinal?: Total Help from another person bathing (including washing, rinsing, drying)?: A Lot Help from another person to put on and taking  off regular upper body clothing?: A Little Help from another person to put on and taking off regular  lower body clothing?: Total 6 Click Score: 13   End of Session Equipment Utilized During Treatment: Gait belt;Oxygen  Nurse Communication: Mobility status  Activity Tolerance: Patient tolerated treatment well Patient left: in chair;with call bell/phone within reach;with chair alarm set;with family/visitor present  OT Visit Diagnosis: Unsteadiness on feet (R26.81);Other abnormalities of gait and mobility (R26.89);Muscle weakness (generalized) (M62.81);History of falling (Z91.81)                Time: 1914-7829 OT Time Calculation (min): 38 min Charges:  OT General Charges $OT Visit: 1 Visit OT Evaluation $OT Eval Moderate Complexity: 1 Mod OT Treatments $Self Care/Home Management : 23-37 mins  Delmer Ferraris, OT  Acute Rehabilitation Services Office [phone] Secure chat preferred   Mickael Alamo 06/11/2023, 4:03 PM

## 2023-06-12 ENCOUNTER — Inpatient Hospital Stay (HOSPITAL_COMMUNITY)

## 2023-06-12 DIAGNOSIS — Y92009 Unspecified place in unspecified non-institutional (private) residence as the place of occurrence of the external cause: Secondary | ICD-10-CM | POA: Diagnosis not present

## 2023-06-12 DIAGNOSIS — I10 Essential (primary) hypertension: Secondary | ICD-10-CM | POA: Diagnosis not present

## 2023-06-12 DIAGNOSIS — W19XXXA Unspecified fall, initial encounter: Secondary | ICD-10-CM | POA: Diagnosis not present

## 2023-06-12 LAB — BASIC METABOLIC PANEL WITH GFR
Anion gap: 6 (ref 5–15)
BUN: 35 mg/dL — ABNORMAL HIGH (ref 8–23)
CO2: 23 mmol/L (ref 22–32)
Calcium: 8.3 mg/dL — ABNORMAL LOW (ref 8.9–10.3)
Chloride: 110 mmol/L (ref 98–111)
Creatinine, Ser: 1.77 mg/dL — ABNORMAL HIGH (ref 0.44–1.00)
GFR, Estimated: 29 mL/min — ABNORMAL LOW (ref >60)
Glucose, Bld: 176 mg/dL — ABNORMAL HIGH (ref 70–99)
Potassium: 4.9 mmol/L (ref 3.5–5.1)
Sodium: 139 mmol/L (ref 135–145)

## 2023-06-12 LAB — CBC
HCT: 25.4 % — ABNORMAL LOW (ref 36.0–46.0)
Hemoglobin: 8.1 g/dL — ABNORMAL LOW (ref 12.0–15.0)
MCH: 31.4 pg (ref 26.0–34.0)
MCHC: 31.9 g/dL (ref 30.0–36.0)
MCV: 98.4 fL (ref 80.0–100.0)
Platelets: 188 10*3/uL (ref 150–400)
RBC: 2.58 MIL/uL — ABNORMAL LOW (ref 3.87–5.11)
RDW: 12.4 % (ref 11.5–15.5)
WBC: 8.7 10*3/uL (ref 4.0–10.5)
nRBC: 0 % (ref 0.0–0.2)

## 2023-06-12 LAB — MAGNESIUM: Magnesium: 2.3 mg/dL (ref 1.7–2.4)

## 2023-06-12 MED ORDER — BISACODYL 10 MG RE SUPP
10.0000 mg | Freq: Once | RECTAL | Status: AC
  Administered 2023-06-12: 10 mg via RECTAL
  Filled 2023-06-12: qty 1

## 2023-06-12 MED ORDER — LACTULOSE 10 GM/15ML PO SOLN
30.0000 g | Freq: Every day | ORAL | Status: DC
  Administered 2023-06-12: 30 g via ORAL
  Filled 2023-06-12: qty 45

## 2023-06-12 MED ORDER — ORAL CARE MOUTH RINSE: 15.0000 mL | OROMUCOSAL | Status: DC | PRN

## 2023-06-12 MED ORDER — AMLODIPINE BESYLATE 5 MG PO TABS
5.0000 mg | ORAL_TABLET | Freq: Every day | ORAL | Status: DC
  Administered 2023-06-12 – 2023-06-14 (×3): 5 mg via ORAL
  Filled 2023-06-12 (×3): qty 1

## 2023-06-12 MED ORDER — CARVEDILOL 3.125 MG PO TABS
3.1250 mg | ORAL_TABLET | Freq: Two times a day (BID) | ORAL | Status: DC
  Administered 2023-06-12 – 2023-06-14 (×6): 3.125 mg via ORAL
  Filled 2023-06-12 (×6): qty 1

## 2023-06-12 MED ORDER — SIMETHICONE 80 MG PO CHEW
80.0000 mg | CHEWABLE_TABLET | Freq: Four times a day (QID) | ORAL | Status: DC
  Administered 2023-06-12 – 2023-06-14 (×7): 80 mg via ORAL
  Filled 2023-06-12 (×7): qty 1

## 2023-06-12 MED ORDER — GUAIFENESIN ER 600 MG PO TB12
1200.0000 mg | ORAL_TABLET | Freq: Two times a day (BID) | ORAL | Status: DC
  Administered 2023-06-12 – 2023-06-14 (×4): 1200 mg via ORAL
  Filled 2023-06-12 (×4): qty 2

## 2023-06-12 NOTE — Plan of Care (Signed)

## 2023-06-12 NOTE — Plan of Care (Signed)
   Problem: Health Behavior/Discharge Planning: Goal: Ability to manage health-related needs will improve Outcome: Completed/Met

## 2023-06-12 NOTE — Progress Notes (Signed)
 [facility] Progress Note Patient: Chelsea [name] ZSW:[id number] DOB: [birth year] DOA: 06/09/2023  DOS: the patient was seen and examined on 06/12/2023  Brief Hospital Course: PMH of CVA with residual right-sided weakness, CAD, PAD, depression, HLD, HTN presented to the hospital with a mechanical fall.  She was working with her walker emptying her body and lost her balance and fell down. Sustained fracture of her right humerus as well as right pubic rami. Currently being treated for pain control and AKI and hypotension. Assessment and Plan: Right pubic rami fracture Right humerus fracture. Orthopedic were consulted in the ED. Current recommend pain control. PT OT consulted.  WBAT for the lower extremity.  Nonweightbearing for the right upper extremity. Will follow recommendation. No surgery recommended by orthopedic.  Hypotension. History of essential hypertension On admission blood pressure was soft.  Now blood pressure is elevated. Home regimen includes carvedilol  3.155 mg, Benicar 10 mg, torsemide 40 mg. Will initiate Norvasc .  AKI on CKD stage IV. Baseline serum creatinine around 2-2.3.  On admission serum creatinine was 2.7.   Ultrasound renal negative for any obstruction. Now renal function improved.  Monitor off of the fluids.  Hyperkalemia. Potassium level elevated.  Initiating Lokelma .  Monitor.  Anemia likely from chronic disease as well as dilutional. Hemoglobin was 10 at baseline.  Hemoglobin was 9.6 on admission.  Currently 8s The drop is mostly dilutional in nature. For now we will monitor.  Diabetes mellitus, uncontrolled with hyperglycemia without long-term insulin  use with nephropathy. Hemoglobin A1c 8.1. Sliding scale insulin .  Monitor.  CAD. CVA. Residual right-sided weakness. HLD On Plavix  at home.  As well as Crestor .  Continue.  History of recurrent UTI. Patient's urologist started her on Flomax .  Currently on hold.  HTN. Chronic HFpEF. On  torsemide at home. Volume status is adequate for now.  At risk for volume overload.  Monitor.   Goals of care conversation. Extensive discussion with daughter at bedside. Daughter asked me about patient's prognosis. Patient has stroke with residual right-sided weakness although she was fairly independent with a walker to move around. Now secondary to pubic rami fracture she is unable to ambulate much. This condition is worsened with a right humerus fracture as she is not able to bear any weight on that. Combination can very well lead to poor prognosis for the patient. prognosis is poor with multiple fractures and prior history of stroke with CKD. Goal currently is to consider SNF for rehabilitation and see how she progresses.  With outpatient ambulatory palliative care.  Constipation. Initiating aggressive bowel regimen.  Monitor. X-ray shows moderate stool burden.  Modified bowel regimen.  Cough. Chest x-ray performed. Report is still pending although on my evaluation I do not see any volume overload.  There is an opacity bilaterally concerning for possible infiltrate. Given that currently she does not have any fever or leukocytosis will monitor clinically although low threshold to initiate antibiotic if she starts having symptoms of infection. Incentive spirometry and flutter valve.   Subjective: No nausea vomiting no fever no chills.  Pain still present but controlled.  Passing gas but no BM.  Minimal oral intake secondary to constipation.  Physical Exam: General: in Mild distress, No Rash Cardiovascular: S1 and S2 Present, No Murmur Respiratory: Good respiratory effort, Bilateral Air entry present. No Crackles, No wheezes Abdomen: Bowel Sound present, No tenderness Extremities: No edema Neuro: Alert and oriented x3, no new focal deficit  Data Reviewed: I have Reviewed nursing notes, Vitals, and Lab results. Since last  encounter, pertinent lab results CBC and BMP   . I have  ordered test including CBC and BMP  . I have ordered imaging x-ray abdomen  .   Disposition: Status is: Inpatient Remains inpatient appropriate because: Monitor for improvement in pain control and arrangement of SNF.  Place and maintain sequential compression device Start: 06/10/23 0823 Place TED hose Start: 06/10/23 0823   Family Communication: Family at bedside Level of care: Med-Surg treated with IV fluid. Now renal function improving to 1.7. Monitor.  Ultrasound renal negative for any hydronephrosis or obstruction. Vitals:   06/12/23 0908 06/12/23 1000 06/12/23 1434 06/12/23 1520  BP:      Pulse:      Resp: 18 16 16 18   Temp:      TempSrc:      SpO2: 96%     Weight:      Height:         Author: [name], MD 06/12/2023 5:11 PM  Please look on [email or web address] to find out who is on call.

## 2023-06-13 DIAGNOSIS — Y92009 Unspecified place in unspecified non-institutional (private) residence as the place of occurrence of the external cause: Secondary | ICD-10-CM | POA: Diagnosis not present

## 2023-06-13 DIAGNOSIS — I10 Essential (primary) hypertension: Secondary | ICD-10-CM | POA: Diagnosis not present

## 2023-06-13 DIAGNOSIS — W19XXXA Unspecified fall, initial encounter: Secondary | ICD-10-CM | POA: Diagnosis not present

## 2023-06-13 LAB — BASIC METABOLIC PANEL WITH GFR
Anion gap: 4 — ABNORMAL LOW (ref 5–15)
BUN: 26 mg/dL — ABNORMAL HIGH (ref 8–23)
CO2: 25 mmol/L (ref 22–32)
Calcium: 8.2 mg/dL — ABNORMAL LOW (ref 8.9–10.3)
Chloride: 108 mmol/L (ref 98–111)
Creatinine, Ser: 1.56 mg/dL — ABNORMAL HIGH (ref 0.44–1.00)
GFR, Estimated: 34 mL/min — ABNORMAL LOW (ref >60)
Glucose, Bld: 170 mg/dL — ABNORMAL HIGH (ref 70–99)
Potassium: 4.7 mmol/L (ref 3.5–5.1)
Sodium: 137 mmol/L (ref 135–145)

## 2023-06-13 LAB — CBC
HCT: 24.4 % — ABNORMAL LOW (ref 36.0–46.0)
HCT: 23.4 % — ABNORMAL LOW (ref 36.0–46.0)
Hemoglobin: 7.7 g/dL — ABNORMAL LOW (ref 12.0–15.0)
Hemoglobin: 7.5 g/dL — ABNORMAL LOW (ref 12.0–15.0)
MCH: 31.2 pg (ref 26.0–34.0)
MCH: 32.1 pg (ref 26.0–34.0)
MCHC: 31.6 g/dL (ref 30.0–36.0)
MCHC: 32.1 g/dL (ref 30.0–36.0)
MCV: 98.8 fL (ref 80.0–100.0)
MCV: 100 fL (ref 80.0–100.0)
Platelets: 211 10*3/uL (ref 150–400)
Platelets: 207 10*3/uL (ref 150–400)
RBC: 2.47 MIL/uL — ABNORMAL LOW (ref 3.87–5.11)
RBC: 2.34 MIL/uL — ABNORMAL LOW (ref 3.87–5.11)
RDW: 12.4 % (ref 11.5–15.5)
RDW: 12.7 % (ref 11.5–15.5)
WBC: 8.9 10*3/uL (ref 4.0–10.5)
WBC: 8.1 10*3/uL (ref 4.0–10.5)
nRBC: 0 % (ref 0.0–0.2)
nRBC: 0 % (ref 0.0–0.2)

## 2023-06-13 LAB — RETICULOCYTES
Immature Retic Fract: 14.6 % (ref 2.3–15.9)
RBC.: 2.34 MIL/uL — ABNORMAL LOW (ref 3.87–5.11)
Retic Count, Absolute: 45.2 10*3/uL (ref 19.0–186.0)
Retic Ct Pct: 1.9 % (ref 0.4–3.1)

## 2023-06-13 LAB — IRON AND TIBC
Iron: 22 ug/dL — ABNORMAL LOW (ref 28–170)
Saturation Ratios: 13 % (ref 10.4–31.8)
TIBC: 168 ug/dL — ABNORMAL LOW (ref 250–450)
UIBC: 146 ug/dL

## 2023-06-13 LAB — TYPE AND SCREEN
ABO/RH(D): O NEG
Antibody Screen: NEGATIVE

## 2023-06-13 LAB — FIBRINOGEN: Fibrinogen: 502 mg/dL — ABNORMAL HIGH (ref 210–475)

## 2023-06-13 LAB — FERRITIN: Ferritin: 370 ng/mL — ABNORMAL HIGH (ref 11–307)

## 2023-06-13 LAB — VITAMIN B12: Vitamin B-12: 449 pg/mL (ref 180–914)

## 2023-06-13 LAB — MAGNESIUM: Magnesium: 2.4 mg/dL (ref 1.7–2.4)

## 2023-06-13 LAB — FOLATE: Folate: 9.3 ng/mL (ref >5.9)

## 2023-06-13 MED ORDER — SODIUM CHLORIDE 0.9 % IV SOLN
200.0000 mg | Freq: Once | INTRAVENOUS | Status: AC
  Administered 2023-06-13: 200 mg via INTRAVENOUS
  Filled 2023-06-13: qty 10

## 2023-06-13 MED ORDER — SENNOSIDES-DOCUSATE SODIUM 8.6-50 MG PO TABS
1.0000 | ORAL_TABLET | Freq: Two times a day (BID) | ORAL | Status: DC
  Administered 2023-06-13 – 2023-06-14 (×3): 1 via ORAL
  Filled 2023-06-13 (×3): qty 1

## 2023-06-13 MED ORDER — B COMPLEX-C PO TABS
1.0000 | ORAL_TABLET | Freq: Every day | ORAL | Status: DC
  Administered 2023-06-13 – 2023-06-14 (×2): 1 via ORAL
  Filled 2023-06-13 (×2): qty 1

## 2023-06-13 MED ORDER — IRON SUCROSE 200 MG IVPB - SIMPLE MED
200.0000 mg | Freq: Once | Status: DC
  Filled 2023-06-13: qty 110

## 2023-06-13 MED ORDER — FERROUS SULFATE 325 (65 FE) MG PO TABS
325.0000 mg | ORAL_TABLET | Freq: Every day | ORAL | Status: DC
  Administered 2023-06-14: 325 mg via ORAL
  Filled 2023-06-13: qty 1

## 2023-06-13 NOTE — Plan of Care (Signed)

## 2023-06-13 NOTE — Progress Notes (Signed)
 Physical Therapy Treatment Patient Details Name: Chelsea [name] MRN: [id number] DOB: May 17, 1945 Today's Date: 06/13/2023   History of Present Illness 78 y.o. female presents to [facility] hospital on 06/09/2023 with fall. Pt found to have superior and inferior pubic rami fx along with mildly displaced fx of the R humeral neck and greater tuberosity. PMH is significant for CVA w/ residual right sided weakness, cardiac arrest, carotid artery occlusion, normocytic anemia followed by hematology oncology, COPD, CKD stage IIIb, DM, HTN.    PT Comments  Pt received in bed, family present. Reviewed with pt use of sling, RUE NWB, RLE WBAT. Assisted pt with proper positioning in sling and ROM exercises for R hand and elbow. Pt performed LE there ex in supine and sitting. Pt needs +2 assist to come to EOB and stand. Used stedy today to give her a greater feeling of safety. Tolerated sit>stand 3x with max A +2. Maintained standing 25-30 secs. Unable to step R foot. Educated family on positioning and exercises they can help her perform in bed. Patient will benefit from continued inpatient follow up therapy, <3 hours/day. Follow     If plan is discharge home, recommend the following: A lot of help with walking and/or transfers;A lot of help with bathing/dressing/bathroom;Assist for transportation;Help with stairs or ramp for entrance;Supervision due to cognitive status   Can travel by private vehicle     No  Equipment Recommendations  None recommended by PT    Recommendations for Other Services       Precautions / Restrictions Precautions Precautions: Fall Recall of Precautions/Restrictions: Intact Required Braces or Orthoses: Sling Restrictions Weight Bearing Restrictions Per Provider Order: No RUE Weight Bearing Per Provider Order: Non weight bearing RLE Weight Bearing Per Provider Order: Weight bearing as tolerated     Mobility  Bed Mobility Overal bed mobility: Needs Assistance Bed Mobility: Supine  to Sit, Sit to Supine     Supine to sit: Max assist, +2 for physical assistance, HOB elevated, Used rails Sit to supine: Max assist, +2 for physical assistance, HOB elevated, Used rails   General bed mobility comments: Pt assisting in guiding legs off bed and raising trunk but overall max +2    Transfers Overall transfer level: Needs assistance Equipment used: Ambulation equipment used Transfers: Sit to/from Stand Sit to Stand: Max assist, +2 physical assistance, +2 safety/equipment, From elevated surface, Via lift equipment           General transfer comment: pt very fearful of falling today so used stedy to help give her greater confidence that she would be supported but do not think it has significant effect. Max A +2 needed for power up. Worked on increasing time in static standing but pt could still tolerate only 25-30 secs Transfer via Lift Equipment: Stedy  Ambulation/Gait               General Gait Details: unable to step R foot in standing   Stairs             Wheelchair Mobility     Tilt Bed    Modified Rankin (Stroke Patients Only)       Balance Overall balance assessment: Needs assistance Sitting-balance support: Bilateral upper extremity supported, Feet supported Sitting balance-Leahy Scale: Fair Sitting balance - Comments: requires single UE support   Standing balance support: Single extremity supported, During functional activity, Reliant on assistive device for balance Standing balance-Leahy Scale: Poor Standing balance comment: Reliant on external support and mod A  Communication Communication Communication: No apparent difficulties  Cognition Arousal: Alert Behavior During Therapy: WFL for tasks assessed/performed   PT - Cognitive impairments: Memory                         Following commands: Intact      Cueing Cueing Techniques: Verbal cues, Tactile cues, Visual cues   Exercises General Exercises - Upper Extremity Elbow Flexion: AAROM, Right, 10 reps, Supine Elbow Extension: AAROM, Right, Supine, 10 reps General Exercises - Lower Extremity Ankle Circles/Pumps: AROM, Both, 10 reps, Supine Quad Sets: AROM, Both, 10 reps, Supine Heel Slides: AROM, Both, 10 reps, Supine Other Exercises Other Exercises: R wrist supination/ pronation x5    General Comments General comments (skin integrity, edema, etc.): SPO2 on initially but sats 97-98% on RA so left off. Mild lightheadedness throughout session. Had this last time with BP stable. Could partially be due to anxiety. DIscussed exercises pt could do in bed, with family      Pertinent Vitals/Pain Pain Assessment Pain Assessment: Faces Faces Pain Scale: Hurts even more Pain Location: R shoulder Pain Descriptors / Indicators: Aching Pain Intervention(s): Limited activity within patient's tolerance, Monitored during session, Repositioned    Home Living                          Prior Function            PT Goals (current goals can now be found in the care plan section) Acute Rehab PT Goals Patient Stated Goal: to be able to walk again PT Goal Formulation: With patient/family Time For Goal Achievement: 06/24/23 Potential to Achieve Goals: Fair Progress towards PT goals: Progressing toward goals    Frequency    Min 3X/week      PT Plan      Co-evaluation              AM-PAC PT "6 Clicks" Mobility   Outcome Measure  Help needed turning from your back to your side while in a flat bed without using bedrails?: A Lot Help needed moving from lying on your back to sitting on the side of a flat bed without using bedrails?: Total Help needed moving to and from a bed to a chair (including a wheelchair)?: Total Help needed standing up from a chair using your arms (e.g., wheelchair or bedside chair)?: A Lot Help needed to walk in hospital room?: Total Help needed climbing 3-5 steps  with a railing? : Total 6 Click Score: 8    End of Session Equipment Utilized During Treatment: Gait belt Activity Tolerance: Patient limited by fatigue;Patient limited by pain Patient left: in bed;with call bell/phone within reach;with bed alarm set;with family/visitor present Nurse Communication: Mobility status PT Visit Diagnosis: Muscle weakness (generalized) (M62.81);Pain;History of falling (Z91.81);Unsteadiness on feet (R26.81) Pain - Right/Left: Right Pain - part of body: Shoulder;Hip     Time: 1610-9604 PT Time Calculation (min) (ACUTE ONLY): 40 min  Charges:    $Therapeutic Exercise: 8-22 mins $Therapeutic Activity: 23-37 mins PT General Charges $$ ACUTE PT VISIT: 1 Visit                     Amey Ka, PT  Acute Rehab Services Secure chat preferred Office [phone]    Deloris Fetters Tawyna Pellot 06/13/2023, 11:43 AM

## 2023-06-13 NOTE — TOC Progression Note (Addendum)
 Transition of Care [facility]) - Progression Note    Patient Details  Name: Chelsea [name] MRN: [id number] Date of Birth: January 19, 1945  Transition of Care [facility]) CM/SW Contact  Katrinka Parr, [place] Phone Number: 06/13/2023, 10:52 AM  Clinical Narrative:     [facility] initially stated they could admit pt Wednesday though now are stating they cannot manage pt's care and cannot offer a bed.   CSW called and updated pt's daughter. CSW provided other SNF bed offers. Daughter is interested in [facility](they have not offered a bed) and states she spoke with someone there who said they had beds available.   CSW inquired with [facility] if they can offer bed for pt; awaiting response.   1250: [facility] notified CSW that they cannot offer a bed. CSW updated daughter. She is interested in exploring [place] facilities. CSW sent additional SNF referrals.   1500: Met with pt's daughter bedside and provided additional SNF bed offers and medicare star ratings. Daughter states she is waiting to hear back from [facility] about what hospital they transport to if pt were to get sick. Daughter is concerned about pt going to [facility] hospital if pt has to go to the hospital from SNF. She states she will notify CSW of choice.   CSW initiated SNF and Dortha Gauss requests with [employer]. Facility will need to be added to auth once available.   Expected Discharge Plan: Skilled Nursing Facility Barriers to Discharge: Insurance Authorization             Social Determinants of Health (SDOH) Interventions SDOH Screenings   Food Insecurity: No Food Insecurity (06/10/2023)  Housing: Patient Unable To Answer (06/10/2023)  Transportation Needs: No Transportation Needs (06/10/2023)  Utilities: Not At Risk (06/10/2023)  Depression (PHQ2-9): Low Risk  (07/29/2022)  Social Connections: Moderately Isolated (06/11/2023)  Tobacco Use: Medium Risk (06/09/2023)    Readmission Risk Interventions     No  data to display

## 2023-06-13 NOTE — Progress Notes (Signed)
 [facility] Progress Note Patient: Chelsea [name] ZOX:[id number] DOB: [birth year] DOA: 06/09/2023  DOS: the patient was seen and examined on 06/13/2023  Brief Hospital Course: PMH of CVA with residual right-sided weakness, CAD, PAD, depression, HLD, HTN presented to the hospital with a mechanical fall.  She was working with her walker emptying her body and lost her balance and fell down. Sustained fracture of her right humerus as well as right pubic rami. Currently being treated for pain control and AKI and hypotension. Assessment and Plan: Right pubic rami fracture Right humerus fracture. Orthopedic were consulted in the ED. Current recommend pain control. PT OT consulted.  WBAT for the lower extremity.  Nonweightbearing for the right upper extremity. Will follow recommendation. No surgery recommended by orthopedic.  Hypotension. History of essential hypertension On admission blood pressure was soft.  Now blood pressure is elevated. Home regimen includes carvedilol  3.155 mg, Benicar 10 mg, torsemide 40 mg. Continue Norvasc .  AKI on CKD stage IV. Baseline serum creatinine around 2-2.3.  On admission serum creatinine was 2.7.   Ultrasound renal negative for any obstruction. Now renal function improved.    Hyperkalemia. Potassium level elevated.  Treated with Lokelma .  Monitor.  Anemia likely from chronic disease as well as dilutional. Iron deficiency Hemoglobin was 10 at baseline.  Hemoglobin was 9.6 on admission.  Initially stabilized at 8.  Now dropped down to 7.3. No significant bleeding. The drop is mostly dilutional in nature. Replace IV iron and vitamins.  Diabetes mellitus, uncontrolled with hyperglycemia without long-term insulin  use with nephropathy. Hemoglobin A1c 8.1. Sliding scale insulin .  Monitor.  CAD. CVA. Residual right-sided weakness. HLD On Plavix  at home.  As well as Crestor .  Continue.  History of recurrent UTI. Patient's urologist started her on  Flomax .  Currently on hold.  HTN. Chronic HFpEF. On torsemide at home. Volume status is adequate for now.  At risk for volume overload.  Monitor.   Goals of care conversation. Extensive discussion with daughter at bedside. Daughter asked me about patient's prognosis. Patient has stroke with residual right-sided weakness although she was fairly independent with a walker to move around. Now secondary to pubic rami fracture she is unable to ambulate much. This condition is worsened with a right humerus fracture as she is not able to bear any weight on that. Combination can very well lead to poor prognosis for the patient. prognosis is poor with multiple fractures and prior history of stroke with CKD. Goal currently is to consider SNF for rehabilitation and see how she progresses.  With outpatient ambulatory palliative care.  Constipation. Resolved.  Cough. Chest x-ray performed.  Retrocardiac opacity.  Continue incentive spirometer. Continue flutter valve as well.   Subjective: No nausea no vomiting no fever no chills.  Continues to have cough.  Physical Exam: General: in Mild distress, No Rash Cardiovascular: S1 and S2 Present, No Murmur Respiratory: Good respiratory effort, Bilateral Air entry present.  Basal crackles, improving with cough no wheezes Abdomen: Bowel Sound present, No tenderness Extremities: No edema Neuro: Alert and oriented x3, no new focal deficit  Data Reviewed: I have Reviewed nursing notes, Vitals, and Lab results. Since last encounter, pertinent lab results CBC and BMP   . I have ordered test including CBC and BMP  .   Disposition: Status is: Inpatient Remains inpatient appropriate because: Monitor for improvement in renal function and CBCs  Place and maintain sequential compression device Start: 06/10/23 0823 Place TED hose Start: 06/10/23 0823   Family Communication: Family  at bedside Level of care: Med-Surg   Vitals:   06/13/23 1320 06/13/23  1627 06/13/23 1638 06/13/23 1722  BP:  (!) 121/51    Pulse:  65    Resp: 15 18 16 17   Temp:  98.2 F (36.8 C)    TempSrc:      SpO2:  100%    Weight:      Height:         Author: [name], MD 06/13/2023 7:13 PM  Please look on [email or web address] to find out who is on call.

## 2023-06-14 ENCOUNTER — Other Ambulatory Visit (HOSPITAL_COMMUNITY): Payer: Self-pay

## 2023-06-14 ENCOUNTER — Encounter: Payer: Self-pay | Admitting: Hematology

## 2023-06-14 DIAGNOSIS — W19XXXA Unspecified fall, initial encounter: Secondary | ICD-10-CM | POA: Diagnosis not present

## 2023-06-14 DIAGNOSIS — Y92009 Unspecified place in unspecified non-institutional (private) residence as the place of occurrence of the external cause: Secondary | ICD-10-CM | POA: Diagnosis not present

## 2023-06-14 DIAGNOSIS — I10 Essential (primary) hypertension: Secondary | ICD-10-CM | POA: Diagnosis not present

## 2023-06-14 LAB — CBC
HCT: 23.5 % — ABNORMAL LOW (ref 36.0–46.0)
Hemoglobin: 7.6 g/dL — ABNORMAL LOW (ref 12.0–15.0)
MCH: 32.1 pg (ref 26.0–34.0)
MCHC: 32.3 g/dL (ref 30.0–36.0)
MCV: 99.2 fL (ref 80.0–100.0)
Platelets: 210 10*3/uL (ref 150–400)
RBC: 2.37 MIL/uL — ABNORMAL LOW (ref 3.87–5.11)
RDW: 12.6 % (ref 11.5–15.5)
WBC: 9 10*3/uL (ref 4.0–10.5)
nRBC: 0 % (ref 0.0–0.2)

## 2023-06-14 LAB — BASIC METABOLIC PANEL WITH GFR
Anion gap: 3 — ABNORMAL LOW (ref 5–15)
BUN: 27 mg/dL — ABNORMAL HIGH (ref 8–23)
CO2: 25 mmol/L (ref 22–32)
Calcium: 8.3 mg/dL — ABNORMAL LOW (ref 8.9–10.3)
Chloride: 109 mmol/L (ref 98–111)
Creatinine, Ser: 1.62 mg/dL — ABNORMAL HIGH (ref 0.44–1.00)
GFR, Estimated: 32 mL/min — ABNORMAL LOW (ref >60)
Glucose, Bld: 215 mg/dL — ABNORMAL HIGH (ref 70–99)
Potassium: 5.1 mmol/L (ref 3.5–5.1)
Sodium: 137 mmol/L (ref 135–145)

## 2023-06-14 LAB — MAGNESIUM: Magnesium: 2.3 mg/dL (ref 1.7–2.4)

## 2023-06-14 MED ORDER — STOOL SOFTENER 100 MG PO TABS: 100.0000 mg | ORAL_TABLET | Freq: Two times a day (BID) | ORAL | Status: AC

## 2023-06-14 MED ORDER — TORSEMIDE 20 MG PO TABS: 20.0000 mg | ORAL_TABLET | Freq: Every day | ORAL | Status: DC | PRN

## 2023-06-14 MED ORDER — TORSEMIDE 20 MG PO TABS: 20.0000 mg | ORAL_TABLET | Freq: Every day | ORAL | Status: DC

## 2023-06-14 MED ORDER — SIMETHICONE 80 MG PO CHEW
80.0000 mg | CHEWABLE_TABLET | Freq: Four times a day (QID) | ORAL | 0 refills | Status: DC
  Filled 2023-06-14: qty 30, 8d supply, fill #0

## 2023-06-14 MED ORDER — HYDROCODONE-ACETAMINOPHEN 5-325 MG PO TABS: 1.0000 | ORAL_TABLET | Freq: Four times a day (QID) | ORAL | 0 refills | Status: DC | PRN

## 2023-06-14 MED ORDER — METHOCARBAMOL 500 MG PO TABS: 250.0000 mg | ORAL_TABLET | Freq: Three times a day (TID) | ORAL | 0 refills | Status: DC | PRN

## 2023-06-14 MED ORDER — FERROUS SULFATE 325 (65 FE) MG PO TABS
325.0000 mg | ORAL_TABLET | Freq: Every day | ORAL | 0 refills | Status: DC
  Filled 2023-06-14: qty 30, 30d supply, fill #0

## 2023-06-14 MED ORDER — LACTULOSE 20 GM/30ML PO SOLN: 30.0000 mL | Freq: Every day | ORAL | Status: DC | PRN

## 2023-06-14 MED ORDER — AMLODIPINE BESYLATE 5 MG PO TABS
5.0000 mg | ORAL_TABLET | Freq: Every day | ORAL | 0 refills | Status: DC
  Filled 2023-06-14: qty 30, 30d supply, fill #0

## 2023-06-14 MED ORDER — B COMPLEX-C PO TABS
1.0000 | ORAL_TABLET | Freq: Every day | ORAL | 0 refills | Status: DC
  Filled 2023-06-14: qty 30, 30d supply, fill #0

## 2023-06-14 MED ORDER — POLYETHYLENE GLYCOL 3350 17 GM/SCOOP PO POWD
17.0000 g | Freq: Every day | ORAL | 0 refills | Status: AC
  Filled 2023-06-14: qty 238, 14d supply, fill #0

## 2023-06-14 NOTE — Plan of Care (Signed)

## 2023-06-14 NOTE — Care Management Important Message (Signed)
 Important Message  Patient Details  Name: Chelsea [name] MRN: [id number] Date of Birth: Jan 22, 1945   Important Message Given:  Yes - Medicare IM     Wynonia Hedges 06/14/2023, 3:12 PM

## 2023-06-14 NOTE — Discharge Summary (Signed)
 Physician Discharge Summary   Patient: Chelsea [name] MRN: [id number] DOB: Jul 09, 1945  Admit date:     06/09/2023  Discharge date: 06/14/23  Discharge Physician: [name]  PCP: Joenathan Muslim, FNP  Recommendations at discharge: Follow up with PCP in 1 week  Follow up with Orthopedics as recommended  Repeat CBC and BMP in 1 week.  May need referral to Palliative care    Follow-up Information     Joenathan Muslim, FNP. Schedule an appointment as soon as possible for a visit in 1 week(s).   Specialty: Family Medicine Why: with BMP lab to look at kidney and electrolytes, with CBC lab to look at blood counts Contact information: [address]  [place] [address] [phone]         Saundra Curl, MD. Schedule an appointment as soon as possible for a visit in 2 week(s).   Specialty: Orthopedic Surgery Contact information: [address] [place] [address] [phone]                Discharge Diagnoses: Principal Problem:   Fall at home, initial encounter Active Problems:   Controlled type 2 diabetes mellitus with hyperglycemia, without long-term current use of insulin  (HCC)   Essential hypertension   Stenosis of left carotid artery   CKD (chronic kidney disease), stage IV (HCC)   History of CVA (cerebrovascular accident)   Acute kidney injury superimposed on CKD (HCC)   PVD (peripheral vascular disease) (HCC)   Closed fracture of multiple pubic rami [facility])  Hospital Course: PMH of CVA with residual right-sided weakness, CAD, PAD, depression, HLD, HTN presented to the hospital with a mechanical fall.  She was working with her walker emptying her body and lost her balance and fell down. Sustained fracture of her right humerus as well as right pubic rami. Was treated for pain control and AKI and hypotension.  Assessment and Plan: Right pubic rami fracture Right humerus fracture. Orthopedic were consulted in the  ED. Current recommend pain control. PT OT recommended SNF. WBAT for the lower extremity.  Nonweightbearing for the right upper extremity. No surgery recommended by orthopedic. Follow up In 2 weeks   Hypotension. History of essential hypertension On admission blood pressure was soft. Now blood pressure is normal.  Hold Benicar 10 mg, torsemide 40 mg. Continue Norvasc  and carvedilol  3.125 mg.  Torsemide as needed.   AKI on CKD stage IV. Baseline serum creatinine around 2-2.3.  On admission serum creatinine was 2.7.   Ultrasound renal negative for any obstruction. Now renal function improved.    Hyperkalemia. Potassium level elevated. Treated with Lokelma .  Monitor.  Anemia likely from chronic disease as well as dilutional. Iron deficiency Hemoglobin was 10 at baseline.  Hemoglobin was 9.6 on admission.  Initially stabilized at 8.  Now dropped down to 7.3. No significant bleeding. The drop is mostly dilutional in nature. Replace IV iron and vitamins.  Diabetes mellitus, uncontrolled with hyperglycemia without long-term insulin  use with nephropathy. Hemoglobin A1c 8.1. Sliding scale insulin .  Monitor.  CAD. CVA. Residual right-sided weakness. HLD On Plavix  at home. As well as Crestor .  Continue.  History of recurrent UTI. Patient's urologist started her on Flomax . Currently on hold.  Chronic HFpEF. On torsemide at home. Volume status is adequate for now.  Switch to PRN only.   Goals of care conversation. Extensive discussion with daughter at bedside. Daughter asked me about patient's prognosis. Patient has stroke with residual right-sided weakness although she  was fairly independent with a walker to move around. Now secondary to pubic rami fracture she is unable to ambulate much. This condition is worsened with a right humerus fracture as she is not able to bear any weight on that. Combination can very well lead to poor prognosis for the patient. prognosis is poor  with multiple fractures and prior history of stroke with CKD. Goal currently is to consider SNF for rehabilitation and see how she progresses.  With outpatient ambulatory palliative care. DNR  Constipation. Resolved.  Cough. Chest x-ray performed.  Retrocardiac opacity.  Continue incentive spirometer. Continue flutter valve as well.   .cdi Pain control - [place]  Controlled Substance Reporting System database was reviewed. and patient was instructed, not to drive, operate heavy machinery, perform activities at heights, swimming or participation in water  activities or provide baby-sitting services while on Pain, Sleep and Anxiety Medications; until their outpatient Physician has advised to do so again. Also recommended to not to take more than prescribed Pain, Sleep and Anxiety Medications.  Consultants:  Phone consult with Orthopedics   Procedures performed:  none  DISCHARGE MEDICATION: Allergies as of 06/14/2023       Reactions   Codeine Anaphylaxis, Swelling   Throat swells    Latex Anaphylaxis   Penicillins Anaphylaxis, Swelling   Throat closes  Tolerated Cephalosporin Date: 06/14/22.   Sulfa Antibiotics Anaphylaxis, Other (See Comments), Swelling   Lips and eye swell   Atorvastatin  Diarrhea, Nausea And Vomiting   Benzodiazepines Hives   Cephalosporins Other (See Comments)   Reaction type/severity unknown - Tolerated Cephalosporin Date: 06/14/22.   Gabapentin Nausea And Vomiting   Other Reaction(s): GI Intolerance   Lidocaine  Hives   Oxycodone-acetaminophen  Palpitations   Pregabalin  Diarrhea, Nausea And Vomiting   Trazodone Other (See Comments)   Sweating, dizziness        Medication List     STOP taking these medications    olmesartan 20 MG tablet Commonly known as: BENICAR   tamsulosin  0.4 MG Caps capsule Commonly known as: FLOMAX        TAKE these medications    acetaminophen  325 MG tablet Commonly known as: TYLENOL  Take 1-2 tablets (325-650  mg total) by mouth every 6 (six) hours as needed for mild pain (pain score 1-3 or temp > 100.5).   albuterol  108 (90 Base) MCG/ACT inhaler Commonly known as: VENTOLIN  HFA Inhale 2 puffs into the lungs every 4 (four) hours as needed.   amLODipine  5 MG tablet Commonly known as: NORVASC  Take 1 tablet (5 mg total) by mouth daily. Start taking on: June 15, 2023   B-complex with vitamin C tablet Take 1 tablet by mouth daily. Start taking on: June 15, 2023   carvedilol  3.125 MG tablet Commonly known as: COREG  TAKE ONE TABLET TWICE DAILY WITH MEAL(S)   clopidogrel  75 MG tablet Commonly known as: PLAVIX  TAKE 1 TABLET ONCE DAILY   feeding supplement (GLUCERNA SHAKE) Liqd Take 237 mLs by mouth 3 (three) times daily between meals.   ferrous sulfate  325 (65 FE) MG tablet Take 1 tablet (325 mg total) by mouth daily with breakfast. Start taking on: June 15, 2023 What changed: when to take this   fluticasone -salmeterol 250-50 MCG/ACT Aepb Commonly known as: ADVAIR INHALE 1 PUFF 2 TIMES A DAY. RINSE MOUTH AFTER USE.   guaiFENesin  600 MG 12 hr tablet Commonly known as: MUCINEX  Take 1 tablet (600 mg total) by mouth 2 (two) times daily. What changed:  when to take this reasons to take  this   HYDROcodone -acetaminophen  5-325 MG tablet Commonly known as: NORCO/VICODIN Take 1 tablet by mouth every 6 (six) hours as needed for moderate pain (pain score 4-6) or severe pain (pain score 7-10).   ipratropium-albuterol  0.5-2.5 (3) MG/3ML Soln Commonly known as: DUONEB Inhale 3 mLs into the lungs every 4 (four) hours as needed.   Lactulose 20 GM/30ML Soln Take 30 mLs (20 g total) by mouth daily as needed (mild constipation).   methocarbamol  500 MG tablet Commonly known as: ROBAXIN  Take 0.5 tablets (250 mg total) by mouth every 8 (eight) hours as needed for muscle spasms.   polyethylene glycol 17 g packet Commonly known as: MIRALAX  / GLYCOLAX  Take 17 g by mouth daily. Start taking on: June 15, 2023 What changed:  when to take this reasons to take this   rosuvastatin  5 MG tablet Commonly known as: CRESTOR  TAKE ONE TABLET DAILY   saccharomyces boulardii 250 MG capsule Commonly known as: FLORASTOR Take 250 mg by mouth 2 (two) times daily.   simethicone  80 MG chewable tablet Commonly known as: MYLICON Chew 1 tablet (80 mg total) by mouth 4 (four) times daily.   Stool Softener 100 MG capsule Generic drug: Docusate Sodium  Take 1-2 tablets (100-200 mg total) by mouth 2 (two) times daily. Take 200mg  in the morning and 100mg  in the evening for a total daily dose of 300mg    torsemide 20 MG tablet Commonly known as: DEMADEX Take 1 tablet (20 mg total) by mouth daily as needed (for weight gain of 3 lbs in 1 day or 5 lbs in 1 week.). What changed:  how much to take when to take this reasons to take this additional instructions   vitamin D3 25 MCG tablet Commonly known as: CHOLECALCIFEROL  Take 2 tablets (2,000 Units total) by mouth daily.       Disposition: SNF Diet recommendation: Regular diet  Discharge Exam: Vitals:   06/13/23 1722 06/13/23 2048 06/14/23 0630 06/14/23 0838  BP:  (!) 133/55 (!) 145/56 (!) 135/52  Pulse:  62 63 71  Resp: 17 17 18 19   Temp:  98.2 F (36.8 C) 98.3 F (36.8 C)   TempSrc:  Oral Oral   SpO2:  99% 99% 99%  Weight:      Height:       General: Appear in mild distress; no visible Abnormal Neck Mass Or lumps, Conjunctiva normal Cardiovascular: S1 and S2 Present, no Murmur, Respiratory: good respiratory effort, Bilateral Air entry present and CTA, no Crackles, no wheezes Abdomen: Bowel Sound present,  Extremities: no Pedal edema Neurology: alert and oriented to time, place, and person chronic right sided weakness.  Filed Weights   06/09/23 0955  Weight: 74.8 kg   Condition at discharge: stable  The results of significant diagnostics from this hospitalization (including imaging, microbiology, ancillary and laboratory) are listed  below for reference.   Imaging Studies: DG Abd Portable 1V Result Date: 06/12/2023 CLINICAL DATA:  Constipation EXAM: PORTABLE ABDOMEN - 1 VIEW COMPARISON:  None Available. FINDINGS: Nonobstructive bowel gas pattern. Small volume stool is seen rectosigmoid colon. No gross free intraperitoneal gas. Right hip ORIF partially visualized. No acute bone abnormality. IMPRESSION: 1. Nonobstructive bowel gas pattern.  Small stool burden. Electronically Signed   By: Worthy Heads M.D.   On: 06/12/2023 19:09   DG CHEST PORT 1 VIEW Result Date: 06/11/2023 CLINICAL DATA:  Dyspnea EXAM: PORTABLE CHEST 1 VIEW COMPARISON:  None Available. FINDINGS: Lung volumes are small but are stable. Bibasilar atelectasis. Rounded density  within the right retrocardiac region is indeterminate, possibly representing vascular shadow or an underlying pulmonary mass. No pneumothorax or pleural effusion. Cardiac size is within normal limits. Pulmonary vascularity is normal. No acute bone abnormality. IMPRESSION: 1. Pulmonary hypoinflation. 2. Rounded density within the right retrocardiac region is indeterminate, possibly representing vascular shadow or an underlying pulmonary mass. This would be better assessed with a standard upright two view chest radiograph or dedicated CT imaging. Electronically Signed   By: Worthy Heads M.D.   On: 06/11/2023 20:18   US  RENAL Result Date: 06/11/2023 CLINICAL DATA:  [id number] AKI (acute kidney injury) [facility]) [id number] EXAM: RENAL / URINARY TRACT ULTRASOUND COMPLETE COMPARISON:  June 19, 2022 FINDINGS: Evaluation is limited secondary to limited acoustic windows and shadowing bowel gas. Right Kidney: Renal measurements: 8.4 x 5.2 x 5.1 cm = volume: 116 mL. Echogenicity within normal limits. No definitive mass or hydronephrosis visualized. Left Kidney: Renal measurements: 7.3 x 3.3 x 3.7 cm = volume: 46 mL. Echogenicity within normal limits. No definitive mass or hydronephrosis visualized. Bladder: Bladder spans  approximately 10.0 x 9.5 by 7.2 cm for an estimated volume of 492 ML Other: None. IMPRESSION: 1. No hydronephrosis. Electronically Signed   By: Clancy Crimes M.D.   On: 06/11/2023 11:43   DG Hip Unilat W or Wo Pelvis 2-3 Views Right Result Date: 06/09/2023 CLINICAL DATA:  Fall. EXAM: DG HIP (WITH OR WITHOUT PELVIS) 2-3V RIGHT COMPARISON:  06/25/2022 FINDINGS: Interlocking screw and rod are again seen in the right hip. There is no evidence of acute hip fracture or dislocation. Fractures of the right superior and inferior pubic rami are new since previous study. Pubic symphysis degenerative changes again noted. Generalized osteopenia. IMPRESSION: Fractures of right superior and inferior pubic rami, new since previous study. No evidence of acute hip fracture or dislocation. Osteopenia. Electronically Signed   By: Marlyce Sine M.D.   On: 06/09/2023 11:11   CT Head Wo Contrast Result Date: 06/09/2023 CLINICAL DATA:  Head trauma, neck trauma. EXAM: CT HEAD WITHOUT CONTRAST CT CERVICAL SPINE WITHOUT CONTRAST TECHNIQUE: Multidetector CT imaging of the head and cervical spine was performed following the standard protocol without intravenous contrast. Multiplanar CT image reconstructions of the cervical spine were also generated. RADIATION DOSE REDUCTION: This exam was performed according to the departmental dose-optimization program which includes automated exposure control, adjustment of the mA and/or kV according to patient size and/or use of iterative reconstruction technique. COMPARISON:  CT head 06/17/2022. FINDINGS: CT HEAD FINDINGS Brain: No acute intracranial hemorrhage. No CT evidence of acute infarct. Similar remote infarct in the left parietal lobe. Remote lacunar infarcts in the bilateral basal ganglia. Nonspecific hypoattenuation in the periventricular and subcortical white matter favored to reflect chronic microvascular ischemic changes. No edema, mass effect, or midline shift. The basilar cisterns  are patent. Ventricles: The ventricles are normal. Vascular: Atherosclerotic calcifications of the carotid siphons. No hyperdense vessel. Skull: No acute or aggressive finding. Orbits: Orbits are symmetric. Sinuses: Mucosal thickening in the left maxillary sinus. Other: Mastoid air cells are clear. CT CERVICAL SPINE FINDINGS Alignment: Cervical lordosis is maintained. No significant listhesis. No facet subluxation or dislocation. Skull base and vertebrae: No acute fracture. No primary bone lesion or focal pathologic process. Soft tissues and spinal canal: No prevertebral fluid or swelling. No visible canal hematoma. Disc levels: Mild disc space narrowing at C6-7. No high-grade osseous spinal canal stenosis. Facet arthrosis at multiple levels in the cervical spine. No high-grade osseous foraminal stenosis. Upper chest: Negative. Other:  None. IMPRESSION: No CT evidence of acute intracranial abnormality. No acute fracture or traumatic malalignment of the cervical spine. Remote infarcts as above.  Chronic microvascular ischemic changes. Electronically Signed   By: Denny Flack M.D.   On: 06/09/2023 11:07   CT Cervical Spine Wo Contrast Result Date: 06/09/2023 CLINICAL DATA:  Head trauma, neck trauma. EXAM: CT HEAD WITHOUT CONTRAST CT CERVICAL SPINE WITHOUT CONTRAST TECHNIQUE: Multidetector CT imaging of the head and cervical spine was performed following the standard protocol without intravenous contrast. Multiplanar CT image reconstructions of the cervical spine were also generated. RADIATION DOSE REDUCTION: This exam was performed according to the departmental dose-optimization program which includes automated exposure control, adjustment of the mA and/or kV according to patient size and/or use of iterative reconstruction technique. COMPARISON:  CT head 06/17/2022. FINDINGS: CT HEAD FINDINGS Brain: No acute intracranial hemorrhage. No CT evidence of acute infarct. Similar remote infarct in the left parietal lobe.  Remote lacunar infarcts in the bilateral basal ganglia. Nonspecific hypoattenuation in the periventricular and subcortical white matter favored to reflect chronic microvascular ischemic changes. No edema, mass effect, or midline shift. The basilar cisterns are patent. Ventricles: The ventricles are normal. Vascular: Atherosclerotic calcifications of the carotid siphons. No hyperdense vessel. Skull: No acute or aggressive finding. Orbits: Orbits are symmetric. Sinuses: Mucosal thickening in the left maxillary sinus. Other: Mastoid air cells are clear. CT CERVICAL SPINE FINDINGS Alignment: Cervical lordosis is maintained. No significant listhesis. No facet subluxation or dislocation. Skull base and vertebrae: No acute fracture. No primary bone lesion or focal pathologic process. Soft tissues and spinal canal: No prevertebral fluid or swelling. No visible canal hematoma. Disc levels: Mild disc space narrowing at C6-7. No high-grade osseous spinal canal stenosis. Facet arthrosis at multiple levels in the cervical spine. No high-grade osseous foraminal stenosis. Upper chest: Negative. Other: None. IMPRESSION: No CT evidence of acute intracranial abnormality. No acute fracture or traumatic malalignment of the cervical spine. Remote infarcts as above.  Chronic microvascular ischemic changes. Electronically Signed   By: Denny Flack M.D.   On: 06/09/2023 11:07   DG Shoulder Right Result Date: 06/09/2023 CLINICAL DATA:  Fall.  Right shoulder pain. EXAM: RIGHT SHOULDER - 2+ VIEW COMPARISON:  None Available. FINDINGS: Acute mildly displaced fracture is seen involving the right humeral neck and extending through the greater tuberosity. No evidence of dislocation. IMPRESSION: Mildly displaced fracture through the right humeral neck and greater tuberosity. Electronically Signed   By: Marlyce Sine M.D.   On: 06/09/2023 11:07    Microbiology: Results for orders placed or performed during the hospital encounter of 06/12/22   Surgical PCR screen     Status: Abnormal   Collection Time: 06/14/22 12:45 AM   Specimen: Nasal Mucosa; Nasal Swab  Result Value Ref Range Status   MRSA, PCR NEGATIVE NEGATIVE Final   Staphylococcus aureus POSITIVE (A) NEGATIVE Final    Comment: (NOTE) The Xpert SA Assay (FDA approved for NASAL specimens in patients 50 years of age and older), is one component of a comprehensive surveillance program. It is not intended to diagnose infection nor to guide or monitor treatment. Performed at [facility], [address], [place], [place] [address]    Labs: CBC: Recent Labs  Lab 06/11/23 [phone] 06/12/23 0347 06/13/23 0437 06/13/23 0842 06/14/23 0503  WBC 8.4 8.7 8.9 8.1 9.0  HGB 8.0* 8.1* 7.7* 7.5* 7.6*  HCT 24.5* 25.4* 24.4* 23.4* 23.5*  MCV 98.4 98.4 98.8 100.0 99.2  PLT 176 188  211 207 210   Basic Metabolic Panel: Recent Labs  Lab 06/10/23 0623 06/11/23 0334 06/12/23 0347 06/13/23 0437 06/14/23 0503  NA 133* 136 139 137 137  K 5.1 5.2* 4.9 4.7 5.1  CL 103 106 110 108 109  CO2 24 22 23 25 25   GLUCOSE 182* 168* 176* 170* 215*  BUN 63* 58* 35* 26* 27*  CREATININE 2.74* 2.66* 1.77* 1.56* 1.62*  CALCIUM  8.3* 8.2* 8.3* 8.2* 8.3*  MG 2.4 2.4 2.3 2.4 2.3   Liver Function Tests: Recent Labs  Lab 06/09/23 1748  AST 17  ALT 16  ALKPHOS 69  BILITOT 0.7  PROT 6.2*  ALBUMIN 3.6   CBG: No results for input(s): "GLUCAP" in the last 168 hours.  Discharge time spent: greater than 30 minutes.  Author: [name], MD  [facility]

## 2023-06-14 NOTE — TOC Transition Note (Signed)
 Transition of Care [facility]) - Discharge Note   Patient Details  Name: Chelsea [name] MRN: [id number] Date of Birth: April 18, 1945  Transition of Care [facility]) CM/SW Contact:  Katrinka Parr, LCSW Phone Number: 06/14/2023, 2:34 PM   Clinical Narrative:     SNF and PTAR auths approved.  SNF Auth# [id number] PTAR Auth# [id number]   Per MD patient ready for DC to [facility]. RN, patient, patient's family, and facility notified of DC. Discharge Summary and FL2 sent to facility. RN to call report prior to discharge [phone]). DC packet on chart. Ambulance transport requested for patient.   CSW will sign off for now as social work intervention is no longer needed. Please consult us  again if new needs arise.    Final next level of care: Skilled Nursing Facility Barriers to Discharge: No Barriers Identified   Patient Goals and CMS Choice            Discharge Placement              Patient chooses bed at: [facility] Patient to be transferred to facility by: [facility]AR Name of family member notified: Allen Israel (Daughter)  [phone] (Mobile) Patient and family notified of of transfer: 06/14/23  Discharge Plan and Services Additional resources added to the After Visit Summary for                                       Social Drivers of Health (SDOH) Interventions SDOH Screenings   Food Insecurity: No Food Insecurity (06/10/2023)  Housing: Patient Unable To Answer (06/10/2023)  Transportation Needs: No Transportation Needs (06/10/2023)  Utilities: Not At Risk (06/10/2023)  Depression (PHQ2-9): Low Risk  (07/29/2022)  Social Connections: Moderately Isolated (06/11/2023)  Tobacco Use: Medium Risk (06/09/2023)     Readmission Risk Interventions     No data to display

## 2023-06-14 NOTE — TOC Progression Note (Signed)
 Transition of Care [facility]) - Progression Note    Patient Details  Name: Chelsea [name] MRN: [id number] Date of Birth: [birth year]  Transition of Care [facility]) CM/SW Contact  Katrinka Parr, [place] Phone Number: 06/14/2023, 10:54 AM  Clinical Narrative:     CSW spoke with pt's daughter who confirmed choice for [facility].   CSW confirmed bed offer with [facility]. They can admit pt once SNF auth is approved.   CSW called [facility] and added facility to [place]. A decision has not been made on auth. [facility] will call CSW once a decision is made.   Expected Discharge Plan: Skilled Nursing Facility Barriers to Discharge: Insurance Authorization  Expected Discharge Plan and Services                                               Social Determinants of Health (SDOH) Interventions SDOH Screenings   Food Insecurity: No Food Insecurity (06/10/2023)  Housing: Patient Unable To Answer (06/10/2023)  Transportation Needs: No Transportation Needs (06/10/2023)  Utilities: Not At Risk (06/10/2023)  Depression (PHQ2-9): Low Risk  (07/29/2022)  Social Connections: Moderately Isolated (06/11/2023)  Tobacco Use: Medium Risk (06/09/2023)    Readmission Risk Interventions     No data to display

## 2023-06-14 NOTE — Care Management Important Message (Signed)
 Important Message  Patient Details  Name: Chelsea [name] MRN: [id number] Date of Birth: 1945-06-24   Important Message Given:  Yes - Medicare IM     Wynonia Hedges 06/14/2023, 3:13 PM

## 2023-06-14 NOTE — Plan of Care (Signed)
  Problem: Clinical Measurements: Goal: Ability to maintain clinical measurements within normal limits will improve Outcome: Adequate for Discharge Goal: Will remain free from infection Outcome: Adequate for Discharge Goal: Diagnostic test results will improve Outcome: Adequate for Discharge Goal: Respiratory complications will improve Outcome: Adequate for Discharge Goal: Cardiovascular complication will be avoided Outcome: Adequate for Discharge   Problem: Activity: Goal: Risk for activity intolerance will decrease Outcome: Adequate for Discharge   Problem: Nutrition: Goal: Adequate nutrition will be maintained Outcome: Adequate for Discharge   Problem: Coping: Goal: Level of anxiety will decrease Outcome: Adequate for Discharge   Problem: Elimination: Goal: Will not experience complications related to bowel motility Outcome: Adequate for Discharge Goal: Will not experience complications related to urinary retention Outcome: Adequate for Discharge   Problem: Pain Managment: Goal: General experience of comfort will improve and/or be controlled Outcome: Adequate for Discharge   Problem: Safety: Goal: Ability to remain free from injury will improve Outcome: Adequate for Discharge   Problem: Skin Integrity: Goal: Risk for impaired skin integrity will decrease Outcome: Adequate for Discharge

## 2023-06-15 DIAGNOSIS — N184 Chronic kidney disease, stage 4 (severe): Secondary | ICD-10-CM | POA: Diagnosis not present

## 2023-06-15 DIAGNOSIS — I130 Hypertensive heart and chronic kidney disease with heart failure and stage 1 through stage 4 chronic kidney disease, or unspecified chronic kidney disease: Secondary | ICD-10-CM | POA: Diagnosis not present

## 2023-06-15 DIAGNOSIS — M6281 Muscle weakness (generalized): Secondary | ICD-10-CM | POA: Diagnosis not present

## 2023-06-15 DIAGNOSIS — E1165 Type 2 diabetes mellitus with hyperglycemia: Secondary | ICD-10-CM | POA: Diagnosis not present

## 2023-06-15 DIAGNOSIS — J449 Chronic obstructive pulmonary disease, unspecified: Secondary | ICD-10-CM | POA: Diagnosis not present

## 2023-06-15 DIAGNOSIS — Z794 Long term (current) use of insulin: Secondary | ICD-10-CM | POA: Diagnosis not present

## 2023-06-15 DIAGNOSIS — E1169 Type 2 diabetes mellitus with other specified complication: Secondary | ICD-10-CM | POA: Diagnosis not present

## 2023-06-15 DIAGNOSIS — I2510 Atherosclerotic heart disease of native coronary artery without angina pectoris: Secondary | ICD-10-CM | POA: Diagnosis not present

## 2023-06-15 DIAGNOSIS — Z1321 Encounter for screening for nutritional disorder: Secondary | ICD-10-CM | POA: Diagnosis not present

## 2023-06-15 DIAGNOSIS — J9611 Chronic respiratory failure with hypoxia: Secondary | ICD-10-CM | POA: Diagnosis not present

## 2023-06-15 DIAGNOSIS — S42211D Unspecified displaced fracture of surgical neck of right humerus, subsequent encounter for fracture with routine healing: Secondary | ICD-10-CM | POA: Diagnosis not present

## 2023-06-15 DIAGNOSIS — I739 Peripheral vascular disease, unspecified: Secondary | ICD-10-CM | POA: Diagnosis not present

## 2023-06-15 DIAGNOSIS — S32591D Other specified fracture of right pubis, subsequent encounter for fracture with routine healing: Secondary | ICD-10-CM | POA: Diagnosis not present

## 2023-06-15 DIAGNOSIS — E559 Vitamin D deficiency, unspecified: Secondary | ICD-10-CM | POA: Diagnosis not present

## 2023-06-16 DIAGNOSIS — Z7189 Other specified counseling: Secondary | ICD-10-CM | POA: Diagnosis not present

## 2023-06-16 DIAGNOSIS — S42211D Unspecified displaced fracture of surgical neck of right humerus, subsequent encounter for fracture with routine healing: Secondary | ICD-10-CM | POA: Diagnosis not present

## 2023-06-16 DIAGNOSIS — R52 Pain, unspecified: Secondary | ICD-10-CM | POA: Diagnosis not present

## 2023-06-16 DIAGNOSIS — N179 Acute kidney failure, unspecified: Secondary | ICD-10-CM | POA: Diagnosis not present

## 2023-06-16 DIAGNOSIS — S32591D Other specified fracture of right pubis, subsequent encounter for fracture with routine healing: Secondary | ICD-10-CM | POA: Diagnosis not present

## 2023-06-16 DIAGNOSIS — Z79899 Other long term (current) drug therapy: Secondary | ICD-10-CM | POA: Diagnosis not present

## 2023-06-16 DIAGNOSIS — N189 Chronic kidney disease, unspecified: Secondary | ICD-10-CM | POA: Diagnosis not present

## 2023-06-20 DIAGNOSIS — Z79899 Other long term (current) drug therapy: Secondary | ICD-10-CM | POA: Diagnosis not present

## 2023-06-20 DIAGNOSIS — Z794 Long term (current) use of insulin: Secondary | ICD-10-CM | POA: Diagnosis not present

## 2023-06-20 DIAGNOSIS — S42211D Unspecified displaced fracture of surgical neck of right humerus, subsequent encounter for fracture with routine healing: Secondary | ICD-10-CM | POA: Diagnosis not present

## 2023-06-20 DIAGNOSIS — E1165 Type 2 diabetes mellitus with hyperglycemia: Secondary | ICD-10-CM | POA: Diagnosis not present

## 2023-06-20 DIAGNOSIS — I2510 Atherosclerotic heart disease of native coronary artery without angina pectoris: Secondary | ICD-10-CM | POA: Diagnosis not present

## 2023-06-20 DIAGNOSIS — R52 Pain, unspecified: Secondary | ICD-10-CM | POA: Diagnosis not present

## 2023-06-21 DIAGNOSIS — Z794 Long term (current) use of insulin: Secondary | ICD-10-CM | POA: Diagnosis not present

## 2023-06-21 DIAGNOSIS — K5900 Constipation, unspecified: Secondary | ICD-10-CM | POA: Diagnosis not present

## 2023-06-21 DIAGNOSIS — E1165 Type 2 diabetes mellitus with hyperglycemia: Secondary | ICD-10-CM | POA: Diagnosis not present

## 2023-06-21 DIAGNOSIS — I2510 Atherosclerotic heart disease of native coronary artery without angina pectoris: Secondary | ICD-10-CM | POA: Diagnosis not present

## 2023-06-21 DIAGNOSIS — I130 Hypertensive heart and chronic kidney disease with heart failure and stage 1 through stage 4 chronic kidney disease, or unspecified chronic kidney disease: Secondary | ICD-10-CM | POA: Diagnosis not present

## 2023-06-21 DIAGNOSIS — I509 Heart failure, unspecified: Secondary | ICD-10-CM | POA: Diagnosis not present

## 2023-06-21 DIAGNOSIS — F32A Depression, unspecified: Secondary | ICD-10-CM | POA: Diagnosis not present

## 2023-06-21 DIAGNOSIS — D509 Iron deficiency anemia, unspecified: Secondary | ICD-10-CM | POA: Diagnosis not present

## 2023-06-22 DIAGNOSIS — K5900 Constipation, unspecified: Secondary | ICD-10-CM | POA: Diagnosis not present

## 2023-06-22 DIAGNOSIS — S42211D Unspecified displaced fracture of surgical neck of right humerus, subsequent encounter for fracture with routine healing: Secondary | ICD-10-CM | POA: Diagnosis not present

## 2023-06-22 DIAGNOSIS — S32591D Other specified fracture of right pubis, subsequent encounter for fracture with routine healing: Secondary | ICD-10-CM | POA: Diagnosis not present

## 2023-06-22 DIAGNOSIS — E1165 Type 2 diabetes mellitus with hyperglycemia: Secondary | ICD-10-CM | POA: Diagnosis not present

## 2023-06-22 DIAGNOSIS — Z794 Long term (current) use of insulin: Secondary | ICD-10-CM | POA: Diagnosis not present

## 2023-06-22 DIAGNOSIS — Z79899 Other long term (current) drug therapy: Secondary | ICD-10-CM | POA: Diagnosis not present

## 2023-06-22 DIAGNOSIS — N184 Chronic kidney disease, stage 4 (severe): Secondary | ICD-10-CM | POA: Diagnosis not present

## 2023-06-22 DIAGNOSIS — F32A Depression, unspecified: Secondary | ICD-10-CM | POA: Diagnosis not present

## 2023-06-22 DIAGNOSIS — J449 Chronic obstructive pulmonary disease, unspecified: Secondary | ICD-10-CM | POA: Diagnosis not present

## 2023-06-23 DIAGNOSIS — E559 Vitamin D deficiency, unspecified: Secondary | ICD-10-CM | POA: Diagnosis not present

## 2023-06-23 DIAGNOSIS — D631 Anemia in chronic kidney disease: Secondary | ICD-10-CM | POA: Diagnosis not present

## 2023-06-23 DIAGNOSIS — N184 Chronic kidney disease, stage 4 (severe): Secondary | ICD-10-CM | POA: Diagnosis not present

## 2023-06-23 DIAGNOSIS — N189 Chronic kidney disease, unspecified: Secondary | ICD-10-CM | POA: Diagnosis not present

## 2023-06-23 DIAGNOSIS — Z79899 Other long term (current) drug therapy: Secondary | ICD-10-CM | POA: Diagnosis not present

## 2023-06-23 DIAGNOSIS — Z794 Long term (current) use of insulin: Secondary | ICD-10-CM | POA: Diagnosis not present

## 2023-06-23 DIAGNOSIS — I509 Heart failure, unspecified: Secondary | ICD-10-CM | POA: Diagnosis not present

## 2023-06-23 DIAGNOSIS — E1165 Type 2 diabetes mellitus with hyperglycemia: Secondary | ICD-10-CM | POA: Diagnosis not present

## 2023-06-29 DIAGNOSIS — E785 Hyperlipidemia, unspecified: Secondary | ICD-10-CM | POA: Diagnosis not present

## 2023-06-29 DIAGNOSIS — Z79899 Other long term (current) drug therapy: Secondary | ICD-10-CM | POA: Diagnosis not present

## 2023-06-29 DIAGNOSIS — E039 Hypothyroidism, unspecified: Secondary | ICD-10-CM | POA: Diagnosis not present

## 2023-06-30 DIAGNOSIS — Z87440 Personal history of urinary (tract) infections: Secondary | ICD-10-CM | POA: Diagnosis not present

## 2023-06-30 DIAGNOSIS — Z79899 Other long term (current) drug therapy: Secondary | ICD-10-CM | POA: Diagnosis not present

## 2023-06-30 DIAGNOSIS — N184 Chronic kidney disease, stage 4 (severe): Secondary | ICD-10-CM | POA: Diagnosis not present

## 2023-07-01 DIAGNOSIS — S32591A Other specified fracture of right pubis, initial encounter for closed fracture: Secondary | ICD-10-CM | POA: Diagnosis not present

## 2023-07-01 DIAGNOSIS — S42294A Other nondisplaced fracture of upper end of right humerus, initial encounter for closed fracture: Secondary | ICD-10-CM | POA: Diagnosis not present

## 2023-07-05 ENCOUNTER — Other Ambulatory Visit: Payer: Self-pay | Admitting: *Deleted

## 2023-07-05 DIAGNOSIS — K5900 Constipation, unspecified: Secondary | ICD-10-CM | POA: Diagnosis not present

## 2023-07-05 DIAGNOSIS — Z79899 Other long term (current) drug therapy: Secondary | ICD-10-CM | POA: Diagnosis not present

## 2023-07-05 DIAGNOSIS — I6523 Occlusion and stenosis of bilateral carotid arteries: Secondary | ICD-10-CM

## 2023-07-06 DIAGNOSIS — M79604 Pain in right leg: Secondary | ICD-10-CM | POA: Diagnosis not present

## 2023-07-06 DIAGNOSIS — M79605 Pain in left leg: Secondary | ICD-10-CM | POA: Diagnosis not present

## 2023-07-15 DIAGNOSIS — I739 Peripheral vascular disease, unspecified: Secondary | ICD-10-CM | POA: Diagnosis not present

## 2023-07-15 DIAGNOSIS — M6281 Muscle weakness (generalized): Secondary | ICD-10-CM | POA: Diagnosis not present

## 2023-07-15 DIAGNOSIS — E1169 Type 2 diabetes mellitus with other specified complication: Secondary | ICD-10-CM | POA: Diagnosis not present

## 2023-07-15 DIAGNOSIS — J9611 Chronic respiratory failure with hypoxia: Secondary | ICD-10-CM | POA: Diagnosis not present

## 2023-07-15 DIAGNOSIS — J449 Chronic obstructive pulmonary disease, unspecified: Secondary | ICD-10-CM | POA: Diagnosis not present

## 2023-07-18 NOTE — Progress Notes (Unsigned)
 Office Note     CC:  follow up Requesting Provider:  Jesus Elberta Gainer, *  HPI: Chelsea [name] is a 78 y.o. (Apr 16, 1945) female who presents for surveillance of carotid artery stenosis.  She was last seen by Dr. Eliza 1 year ago.   She has history of a left carotid endarterectomy for asymptomatic high-grade stenosis on 12/03/2019 by Dr. Eliza.  She has not been diagnosed with CVA or TIA since last office visit.  She unfortunately fell and fractured her pelvis in May of this year.  She continues to be wheelchair-bound since sustaining the fall and is unable to stand to transfer to the bed for carotid duplex today.  She denies any strokelike symptoms including slurring speech, changes in vision, or one-sided weakness.  She denies tobacco use.  She is on Plavix  and statin daily.    Past Medical History:  Diagnosis Date   Allergy    Anemia    Blind left eye    Cardiac arrest [facility])    Carotid artery occlusion    Cataract    Depression    Diabetes mellitus without complication (HCC)    Hyperlipidemia    Hypertension    Oxygen  deficiency    Stroke [facility])     Past Surgical History:  Procedure Laterality Date   ABDOMINAL HYSTERECTOMY     ENDARTERECTOMY Left 12/03/2019   Procedure: LEFT CAROTID ENDARTERECTOMY;  Surgeon: Eliza Lonni RAMAN, MD;  Location: [facility] OR;  Service: Vascular;  Laterality: Left;   INTRAMEDULLARY (IM) NAIL INTERTROCHANTERIC Right 06/14/2022   Procedure: INTRAMEDULLARY (IM) NAIL INTERTROCHANTERIC, REPAIR OF RIGHT HIP FRACTURE;  Surgeon: Celena Sharper, MD;  Location: [facility] OR;  Service: Orthopedics;  Laterality: Right;   PATCH ANGIOPLASTY Left 12/03/2019   Procedure: PATCH ANGIOPLASTY Left Carotid Artery;  Surgeon: Eliza Lonni RAMAN, MD;  Location: [facility] OR;  Service: Vascular;  Laterality: Left;    Social History   Socioeconomic History   Marital status: Divorced    Spouse name: Not on file   Number of children: Not on file   Years of education: Not on  file   Highest education level: Not on file  Occupational History   Not on file  Tobacco Use   Smoking status: Former    Types: Cigarettes   Smokeless tobacco: Never  Vaping Use   Vaping status: Never Used  Substance and Sexual Activity   Alcohol use: Not Currently   Drug use: Never   Sexual activity: Not Currently  Other Topics Concern   Not on file  Social History Narrative   Lives with daughter, Samule   Right Handed   Drinks no caffeine   Social Drivers of Health   Financial Resource Strain: Not on file  Food Insecurity: No Food Insecurity (06/10/2023)   Hunger Vital Sign    Worried About Running Out of Food in the Last Year: Never true    Ran Out of Food in the Last Year: Never true  Transportation Needs: No Transportation Needs (06/10/2023)   PRAPARE - Administrator, Civil Service (Medical): No    Lack of Transportation (Non-Medical): No  Physical Activity: Not on file  Stress: Not on file  Social Connections: Moderately Isolated (06/11/2023)   Social Connection and Isolation Panel    Frequency of Communication with Friends and Family: More than three times a week    Frequency of Social Gatherings with Friends and Family: More than three times a week    Attends Religious Services: 1  to 4 times per year    Active Member of Clubs or Organizations: No    Attends Banker Meetings: Never    Marital Status: Divorced  Catering manager Violence: Not At Risk (06/10/2023)   Humiliation, Afraid, Rape, and Kick questionnaire    Fear of Current or Ex-Partner: No    Emotionally Abused: No    Physically Abused: No    Sexually Abused: No    Family History  Problem Relation Age of Onset   Hypertension Mother    Hyperlipidemia Mother    Diabetes Mother    Cancer Mother    Arthritis Mother    Heart attack Mother    Hypertension Father    Hyperlipidemia Father    Diabetes Father    Arthritis Father    Stroke Father    Hypertension Sister     Heart disease Sister    Heart attack Sister    Diabetes Sister    Depression Sister    COPD Sister    Arthritis Sister    Stroke Brother    Hypertension Brother    Heart disease Brother    Diabetes Brother    Depression Brother    Arthritis Brother    Stroke Daughter    Hypertension Daughter    Diabetes Daughter    Depression Daughter    Arthritis Daughter    Diabetes Son    Depression Son     Current Outpatient Medications  Medication Sig Dispense Refill   acetaminophen  (TYLENOL ) 325 MG tablet Take 1-2 tablets (325-650 mg total) by mouth every 6 (six) hours as needed for mild pain (pain score 1-3 or temp > 100.5). 60 tablet 0   albuterol  (VENTOLIN  HFA) 108 (90 Base) MCG/ACT inhaler Inhale 2 puffs into the lungs every 4 (four) hours as needed.     amLODipine  (NORVASC ) 5 MG tablet Take 1 tablet (5 mg total) by mouth daily. 30 tablet 0   B Complex-C (B-COMPLEX WITH VITAMIN C) tablet Take 1 tablet by mouth daily. 30 tablet 0   carvedilol  (COREG ) 3.125 MG tablet TAKE ONE TABLET TWICE DAILY WITH MEAL(S) 180 tablet 2   cholecalciferol  (CHOLECALCIFEROL ) 25 MCG tablet Take 2 tablets (2,000 Units total) by mouth daily.     clopidogrel  (PLAVIX ) 75 MG tablet TAKE 1 TABLET ONCE DAILY 90 tablet 1   Docusate Sodium  (STOOL SOFTENER) 100 MG capsule Take 1-2 tablets (100-200 mg total) by mouth 2 (two) times daily. Take 200mg  in the morning and 100mg  in the evening for a total daily dose of 300mg      feeding supplement, GLUCERNA SHAKE, (GLUCERNA SHAKE) LIQD Take 237 mLs by mouth 3 (three) times daily between meals.  0   ferrous sulfate  325 (65 FE) MG tablet Take 1 tablet (325 mg total) by mouth daily with breakfast. 30 tablet 0   fluticasone -salmeterol (ADVAIR) 250-50 MCG/ACT AEPB INHALE 1 PUFF 2 TIMES A DAY. RINSE MOUTH AFTER USE. 60 each 3   guaiFENesin  (MUCINEX ) 600 MG 12 hr tablet Take 1 tablet (600 mg total) by mouth 2 (two) times daily. (Patient taking differently: Take 600 mg by mouth 2 (two)  times daily as needed.) 14 tablet 0   HYDROcodone -acetaminophen  (NORCO/VICODIN) 5-325 MG tablet Take 1 tablet by mouth every 6 (six) hours as needed for moderate pain (pain score 4-6) or severe pain (pain score 7-10). 30 tablet 0   ipratropium-albuterol  (DUONEB) 0.5-2.5 (3) MG/3ML SOLN Inhale 3 mLs into the lungs every 4 (four) hours as needed. 360 mL  2   Lactulose  20 GM/30ML SOLN Take 30 mLs (20 g total) by mouth daily as needed (mild constipation).     methocarbamol  (ROBAXIN ) 500 MG tablet Take 0.5 tablets (250 mg total) by mouth every 8 (eight) hours as needed for muscle spasms. 30 tablet 0   polyethylene glycol powder (GLYCOLAX /MIRALAX ) 17 GM/SCOOP powder Take 17 g by mouth daily. 238 g 0   rosuvastatin  (CRESTOR ) 5 MG tablet TAKE ONE TABLET DAILY 90 tablet 2   saccharomyces boulardii (FLORASTOR) 250 MG capsule Take 250 mg by mouth 2 (two) times daily.     simethicone  (MYLICON) 80 MG chewable tablet Chew 1 tablet (80 mg total) by mouth 4 (four) times daily. 30 tablet 0   torsemide  (DEMADEX ) 20 MG tablet Take 1 tablet (20 mg total) by mouth daily as needed (for weight gain of 3 lbs in 1 day or 5 lbs in 1 week.).     No current facility-administered medications for this visit.    Allergies  Allergen Reactions   Codeine Anaphylaxis and Swelling    Throat swells     Latex Anaphylaxis   Penicillins Anaphylaxis and Swelling    Throat closes   Tolerated Cephalosporin Date: 06/14/22.     Sulfa Antibiotics Anaphylaxis, Other (See Comments) and Swelling    Lips and eye swell   Atorvastatin  Diarrhea and Nausea And Vomiting   Benzodiazepines Hives   Cephalosporins Other (See Comments)    Reaction type/severity unknown - Tolerated Cephalosporin Date: 06/14/22.     Gabapentin Nausea And Vomiting    Other Reaction(s): GI Intolerance   Lidocaine  Hives   Oxycodone-Acetaminophen  Palpitations   Pregabalin  Diarrhea and Nausea And Vomiting   Trazodone Other (See Comments)    Sweating, dizziness      REVIEW OF SYSTEMS:   [X]  denotes positive finding, [ ]  denotes negative finding Cardiac  Comments:  Chest pain or chest pressure:    Shortness of breath upon exertion:    Short of breath when lying flat:    Irregular heart rhythm:        Vascular    Pain in calf, thigh, or hip brought on by ambulation:    Pain in feet at night that wakes you up from your sleep:     Blood clot in your veins:    Leg swelling:         Pulmonary    Oxygen  at home:    Productive cough:     Wheezing:         Neurologic    Sudden weakness in arms or legs:     Sudden numbness in arms or legs:     Sudden onset of difficulty speaking or slurred speech:    Temporary loss of vision in one eye:     Problems with dizziness:         Gastrointestinal    Blood in stool:     Vomited blood:         Genitourinary    Burning when urinating:     Blood in urine:        Psychiatric    Major depression:         Hematologic    Bleeding problems:    Problems with blood clotting too easily:        Skin    Rashes or ulcers:        Constitutional    Fever or chills:      PHYSICAL EXAMINATION:  General:  WDWN in  NAD; vital signs documented above Gait: Not observed HENT: WNL, normocephalic Pulmonary: normal non-labored breathing Cardiac: regular HR Abdomen: soft, NT, no masses Skin: without rashes Vascular Exam/Pulses: symmetrical radial pulses Extremities: edema R more than LLE; no wounds or ulcerations Musculoskeletal: no muscle wasting or atrophy  Neurologic: A&O X 3; CN grossly intact Psychiatric:  The pt has Normal affect.   Non-Invasive Vascular Imaging:   Unable to obtain carotid duplex today due to inability to transfer from the chair to bed with recent pelvic fracture    ASSESSMENT/PLAN:: 78 y.o. female here for follow up for surveillance of carotid artery stenosis with history of L CEA  Ms. Lesmeister is a 78 year old female with history of left carotid endarterectomy due to  asymptomatic high-grade stenosis of the left ICA.  Duplex from 1 year ago demonstrated a widely patent left carotid endarterectomy site.  The right ICA had minimal stenosis less than 39%.  She recently sustained a pelvic fracture from a fall and is unable to transfer to the bed for duplex.  Fortunately she has not experienced any strokelike symptoms including slurring speech, changes in vision, or one-sided weakness.  She will continue her Plavix  and statin daily.  We will plan to check a carotid duplex in 6 months at the [facility] office.  She knows to seek immediate medical attention if she develops any strokelike symptoms in the meantime.   Donnice Sender, PA-C [facility] [phone]

## 2023-07-19 ENCOUNTER — Encounter: Payer: Self-pay | Admitting: Physician Assistant

## 2023-07-19 ENCOUNTER — Ambulatory Visit: Admitting: Physician Assistant

## 2023-07-19 ENCOUNTER — Ambulatory Visit

## 2023-07-19 DIAGNOSIS — I6523 Occlusion and stenosis of bilateral carotid arteries: Secondary | ICD-10-CM | POA: Diagnosis not present

## 2023-07-20 ENCOUNTER — Other Ambulatory Visit: Payer: Self-pay

## 2023-07-20 DIAGNOSIS — E559 Vitamin D deficiency, unspecified: Secondary | ICD-10-CM | POA: Diagnosis not present

## 2023-07-20 DIAGNOSIS — J449 Chronic obstructive pulmonary disease, unspecified: Secondary | ICD-10-CM | POA: Diagnosis not present

## 2023-07-20 DIAGNOSIS — D631 Anemia in chronic kidney disease: Secondary | ICD-10-CM | POA: Diagnosis not present

## 2023-07-20 DIAGNOSIS — I509 Heart failure, unspecified: Secondary | ICD-10-CM | POA: Diagnosis not present

## 2023-07-20 DIAGNOSIS — F32A Depression, unspecified: Secondary | ICD-10-CM | POA: Diagnosis not present

## 2023-07-20 DIAGNOSIS — Z87440 Personal history of urinary (tract) infections: Secondary | ICD-10-CM | POA: Diagnosis not present

## 2023-07-20 DIAGNOSIS — I6523 Occlusion and stenosis of bilateral carotid arteries: Secondary | ICD-10-CM

## 2023-07-20 DIAGNOSIS — N184 Chronic kidney disease, stage 4 (severe): Secondary | ICD-10-CM | POA: Diagnosis not present

## 2023-07-20 DIAGNOSIS — E1165 Type 2 diabetes mellitus with hyperglycemia: Secondary | ICD-10-CM | POA: Diagnosis not present

## 2023-07-20 DIAGNOSIS — I2510 Atherosclerotic heart disease of native coronary artery without angina pectoris: Secondary | ICD-10-CM | POA: Diagnosis not present

## 2023-07-20 DIAGNOSIS — S42211D Unspecified displaced fracture of surgical neck of right humerus, subsequent encounter for fracture with routine healing: Secondary | ICD-10-CM | POA: Diagnosis not present

## 2023-07-20 DIAGNOSIS — S32591D Other specified fracture of right pubis, subsequent encounter for fracture with routine healing: Secondary | ICD-10-CM | POA: Diagnosis not present

## 2023-07-22 DIAGNOSIS — S42294A Other nondisplaced fracture of upper end of right humerus, initial encounter for closed fracture: Secondary | ICD-10-CM | POA: Diagnosis not present

## 2023-07-25 DIAGNOSIS — Z79899 Other long term (current) drug therapy: Secondary | ICD-10-CM | POA: Diagnosis not present

## 2023-07-25 DIAGNOSIS — R52 Pain, unspecified: Secondary | ICD-10-CM | POA: Diagnosis not present

## 2023-07-25 DIAGNOSIS — S42211D Unspecified displaced fracture of surgical neck of right humerus, subsequent encounter for fracture with routine healing: Secondary | ICD-10-CM | POA: Diagnosis not present

## 2023-07-27 DIAGNOSIS — R296 Repeated falls: Secondary | ICD-10-CM | POA: Diagnosis not present

## 2023-07-27 DIAGNOSIS — S32591D Other specified fracture of right pubis, subsequent encounter for fracture with routine healing: Secondary | ICD-10-CM | POA: Diagnosis not present

## 2023-07-27 DIAGNOSIS — N184 Chronic kidney disease, stage 4 (severe): Secondary | ICD-10-CM | POA: Diagnosis not present

## 2023-07-27 DIAGNOSIS — I69351 Hemiplegia and hemiparesis following cerebral infarction affecting right dominant side: Secondary | ICD-10-CM | POA: Diagnosis not present

## 2023-07-27 DIAGNOSIS — K5900 Constipation, unspecified: Secondary | ICD-10-CM | POA: Diagnosis not present

## 2023-07-27 DIAGNOSIS — S42211D Unspecified displaced fracture of surgical neck of right humerus, subsequent encounter for fracture with routine healing: Secondary | ICD-10-CM | POA: Diagnosis not present

## 2023-07-27 DIAGNOSIS — F32A Depression, unspecified: Secondary | ICD-10-CM | POA: Diagnosis not present

## 2023-07-27 DIAGNOSIS — I509 Heart failure, unspecified: Secondary | ICD-10-CM | POA: Diagnosis not present

## 2023-07-27 DIAGNOSIS — J449 Chronic obstructive pulmonary disease, unspecified: Secondary | ICD-10-CM | POA: Diagnosis not present

## 2023-07-27 DIAGNOSIS — E785 Hyperlipidemia, unspecified: Secondary | ICD-10-CM | POA: Diagnosis not present

## 2023-07-27 DIAGNOSIS — E559 Vitamin D deficiency, unspecified: Secondary | ICD-10-CM | POA: Diagnosis not present

## 2023-07-27 DIAGNOSIS — I10 Essential (primary) hypertension: Secondary | ICD-10-CM | POA: Diagnosis not present

## 2023-07-27 DIAGNOSIS — E1165 Type 2 diabetes mellitus with hyperglycemia: Secondary | ICD-10-CM | POA: Diagnosis not present

## 2023-07-30 DIAGNOSIS — D631 Anemia in chronic kidney disease: Secondary | ICD-10-CM | POA: Diagnosis not present

## 2023-07-30 DIAGNOSIS — I5032 Chronic diastolic (congestive) heart failure: Secondary | ICD-10-CM | POA: Diagnosis not present

## 2023-07-30 DIAGNOSIS — E559 Vitamin D deficiency, unspecified: Secondary | ICD-10-CM | POA: Diagnosis not present

## 2023-07-30 DIAGNOSIS — E785 Hyperlipidemia, unspecified: Secondary | ICD-10-CM | POA: Diagnosis not present

## 2023-07-30 DIAGNOSIS — Z794 Long term (current) use of insulin: Secondary | ICD-10-CM | POA: Diagnosis not present

## 2023-07-30 DIAGNOSIS — I130 Hypertensive heart and chronic kidney disease with heart failure and stage 1 through stage 4 chronic kidney disease, or unspecified chronic kidney disease: Secondary | ICD-10-CM | POA: Diagnosis not present

## 2023-07-30 DIAGNOSIS — E1122 Type 2 diabetes mellitus with diabetic chronic kidney disease: Secondary | ICD-10-CM | POA: Diagnosis not present

## 2023-07-30 DIAGNOSIS — D509 Iron deficiency anemia, unspecified: Secondary | ICD-10-CM | POA: Diagnosis not present

## 2023-07-30 DIAGNOSIS — I69351 Hemiplegia and hemiparesis following cerebral infarction affecting right dominant side: Secondary | ICD-10-CM | POA: Diagnosis not present

## 2023-07-30 DIAGNOSIS — N184 Chronic kidney disease, stage 4 (severe): Secondary | ICD-10-CM | POA: Diagnosis not present

## 2023-07-30 DIAGNOSIS — S32591D Other specified fracture of right pubis, subsequent encounter for fracture with routine healing: Secondary | ICD-10-CM | POA: Diagnosis not present

## 2023-07-30 DIAGNOSIS — I2510 Atherosclerotic heart disease of native coronary artery without angina pectoris: Secondary | ICD-10-CM | POA: Diagnosis not present

## 2023-07-30 DIAGNOSIS — F32A Depression, unspecified: Secondary | ICD-10-CM | POA: Diagnosis not present

## 2023-07-30 DIAGNOSIS — S42251D Displaced fracture of greater tuberosity of right humerus, subsequent encounter for fracture with routine healing: Secondary | ICD-10-CM | POA: Diagnosis not present

## 2023-07-30 DIAGNOSIS — N179 Acute kidney failure, unspecified: Secondary | ICD-10-CM | POA: Diagnosis not present

## 2023-07-30 DIAGNOSIS — Z7951 Long term (current) use of inhaled steroids: Secondary | ICD-10-CM | POA: Diagnosis not present

## 2023-07-30 DIAGNOSIS — Z87440 Personal history of urinary (tract) infections: Secondary | ICD-10-CM | POA: Diagnosis not present

## 2023-07-30 DIAGNOSIS — Z9981 Dependence on supplemental oxygen: Secondary | ICD-10-CM | POA: Diagnosis not present

## 2023-07-30 DIAGNOSIS — Z9181 History of falling: Secondary | ICD-10-CM | POA: Diagnosis not present

## 2023-07-30 DIAGNOSIS — Z7902 Long term (current) use of antithrombotics/antiplatelets: Secondary | ICD-10-CM | POA: Diagnosis not present

## 2023-07-30 DIAGNOSIS — J449 Chronic obstructive pulmonary disease, unspecified: Secondary | ICD-10-CM | POA: Diagnosis not present

## 2023-07-30 DIAGNOSIS — E1151 Type 2 diabetes mellitus with diabetic peripheral angiopathy without gangrene: Secondary | ICD-10-CM | POA: Diagnosis not present

## 2023-08-09 ENCOUNTER — Encounter (HOSPITAL_BASED_OUTPATIENT_CLINIC_OR_DEPARTMENT_OTHER): Payer: Self-pay | Admitting: Family

## 2023-08-09 ENCOUNTER — Ambulatory Visit (INDEPENDENT_AMBULATORY_CARE_PROVIDER_SITE_OTHER): Admitting: Family

## 2023-08-09 VITALS — BP 118/58 | HR 70 | Ht 67.0 in | Wt 165.0 lb

## 2023-08-09 DIAGNOSIS — S61402A Unspecified open wound of left hand, initial encounter: Secondary | ICD-10-CM | POA: Diagnosis not present

## 2023-08-09 DIAGNOSIS — I10 Essential (primary) hypertension: Secondary | ICD-10-CM | POA: Diagnosis not present

## 2023-08-09 DIAGNOSIS — Z8673 Personal history of transient ischemic attack (TIA), and cerebral infarction without residual deficits: Secondary | ICD-10-CM

## 2023-08-09 MED ORDER — OLMESARTAN MEDOXOMIL 20 MG PO TABS: 20.0000 mg | ORAL_TABLET | Freq: Every day | ORAL | 1 refills | Status: AC

## 2023-08-09 MED ORDER — TORSEMIDE 20 MG PO TABS: 20.0000 mg | ORAL_TABLET | Freq: Every day | ORAL | 2 refills | Status: AC

## 2023-08-09 MED ORDER — CARVEDILOL 3.125 MG PO TABS: 3.1250 mg | ORAL_TABLET | Freq: Two times a day (BID) | ORAL | 2 refills | Status: AC

## 2023-08-09 NOTE — Patient Instructions (Signed)
 Medication Instructions:  Continue your current medications.  We will consider medication changes based on your kidney function.  Ask primary care about insulin . As Dr. Macel about vitamin C and vitamin D  supplements.   *If you need a refill on your cardiac medications before your next appointment, please call your pharmacy*  Lab Work: Your physician recommends that you return for lab work today: BMET, CBC, PTH   If you have labs (blood work) drawn today and your tests are completely normal, you will receive your results only by: MyChart Message (if you have MyChart) OR A paper copy in the mail If you have any lab test that is abnormal or we need to change your treatment, we will call you to review the results.  Follow-Up: At [facility], you and your health needs are our priority.  As part of our continuing mission to provide you with exceptional heart care, our providers are all part of one team.  This team includes your primary Cardiologist (physician) and Advanced Practice Providers or APPs (Physician Assistants and Nurse Practitioners) who all work together to provide you with the care you need, when you need it.  Your next appointment:   3-4 month(s)  Provider:   Shelda Bruckner, MD    We recommend signing up for the patient portal called MyChart.  Sign up information is provided on this After Visit Summary.  MyChart is used to connect with patients for Virtual Visits (Telemedicine).  Patients are able to view lab/test results, encounter notes, upcoming appointments, etc.  Non-urgent messages can be sent to your provider as well.   To learn more about what you can do with MyChart, go to [email or web address].   Other Instructions  Tips to Measure your Blood Pressure Correctly  To determine whether you have hypertension, a medical professional will take a blood pressure reading. How you prepare for the test, the position of your arm, and other factors can  change a blood pressure reading by 10% or more. That could be enough to hide high blood pressure, start you on a drug you don't really need, or lead your doctor to incorrectly adjust your medications.  National and international guidelines offer specific instructions for measuring blood pressure. If a doctor, nurse, or medical assistant isn't doing it right, don't hesitate to ask him or her to get with the guidelines.  Here's what you can do to ensure a correct reading:  Don't drink a caffeinated beverage or smoke during the 30 minutes before the test.  Sit quietly for five minutes before the test begins.  During the measurement, sit in a chair with your feet on the floor and your arm supported so your elbow is at about heart level.  The inflatable part of the cuff should completely cover at least 80% of your upper arm, and the cuff should be placed on bare skin, not over a shirt.  Don't talk during the measurement.  Have your blood pressure measured twice, with a brief break in between. If the readings are different by 5 points or more, have it done a third time.  In 2017, new guidelines from the American Heart Association, the [employer] of Cardiology, and nine other health organizations lowered the diagnosis of high blood pressure to 130/80 mm Hg or higher for all adults. The guidelines also redefined the various blood pressure categories to now include normal, elevated, Stage 1 hypertension, Stage 2 hypertension, and hypertensive crisis (see Blood pressure categories).  Blood pressure categories  Blood pressure category SYSTOLIC (upper number)  DIASTOLIC (lower number)  Normal Less than 120 mm Hg and Less than 80 mm Hg  Elevated 120-129 mm Hg and Less than 80 mm Hg  High blood pressure: Stage 1 hypertension 130-139 mm Hg or 80-89 mm Hg  High blood pressure: Stage 2 hypertension 140 mm Hg or higher or 90 mm Hg or higher  Hypertensive crisis (consult your doctor immediately) Higher  than 180 mm Hg and/or Higher than 120 mm Hg  Source: American Heart Association and American Stroke Association. For more on getting your blood pressure under control, buy Controlling Your Blood Pressure, a Special Health Report from [facility].   Blood Pressure Log   Date   Time  Blood Pressure  Position  Example: Nov 1 9 AM 124/78 sitting                                                   To prevent or reduce lower extremity swelling: Eat a low salt diet. Salt makes the body hold onto extra fluid which causes swelling. Sit with legs elevated. For example, in the recliner or on an ottoman.  Wear knee-high compression stockings during the daytime. Ones labeled 15-20 mmHg provide good compression.

## 2023-08-09 NOTE — Progress Notes (Unsigned)
  Presents today for follow up with her daughter Samule   PT at home twice per week  In hospital olmesartan  was held due to hypotension and Amlodipine  continued.   Monitoring BP at home with Sunday 9 AM 166/82 ? 2:30pm 185/84 ? 7:00PM 101/56 8am 139/72 ? 122/64 (HH nurse) ? 101/56 (checked in clinic today).  When nurse came to the house  Her present schedule: AM : Benicar , Carvedilol , Torsemide  PM : Carvedilol   TOrsemide  1 tablet daily. Does use extra PRN but has not needed PRN dosing   Checking blood pressure at home with upper arm cuff.   Seeing primary care tomorrow. Dr. Macel early August.   173, 220, 180

## 2023-08-10 ENCOUNTER — Encounter (HOSPITAL_BASED_OUTPATIENT_CLINIC_OR_DEPARTMENT_OTHER): Payer: Self-pay | Admitting: Family

## 2023-08-10 DIAGNOSIS — D508 Other iron deficiency anemias: Secondary | ICD-10-CM | POA: Diagnosis not present

## 2023-08-10 DIAGNOSIS — E7800 Pure hypercholesterolemia, unspecified: Secondary | ICD-10-CM | POA: Diagnosis not present

## 2023-08-10 DIAGNOSIS — N184 Chronic kidney disease, stage 4 (severe): Secondary | ICD-10-CM | POA: Diagnosis not present

## 2023-08-10 DIAGNOSIS — J449 Chronic obstructive pulmonary disease, unspecified: Secondary | ICD-10-CM | POA: Diagnosis not present

## 2023-08-10 DIAGNOSIS — E440 Moderate protein-calorie malnutrition: Secondary | ICD-10-CM | POA: Diagnosis not present

## 2023-08-10 DIAGNOSIS — I10 Essential (primary) hypertension: Secondary | ICD-10-CM | POA: Diagnosis not present

## 2023-08-10 DIAGNOSIS — I129 Hypertensive chronic kidney disease with stage 1 through stage 4 chronic kidney disease, or unspecified chronic kidney disease: Secondary | ICD-10-CM | POA: Diagnosis not present

## 2023-08-10 DIAGNOSIS — I110 Hypertensive heart disease with heart failure: Secondary | ICD-10-CM | POA: Diagnosis not present

## 2023-08-10 DIAGNOSIS — Z79899 Other long term (current) drug therapy: Secondary | ICD-10-CM | POA: Diagnosis not present

## 2023-08-10 DIAGNOSIS — E871 Hypo-osmolality and hyponatremia: Secondary | ICD-10-CM | POA: Diagnosis not present

## 2023-08-10 DIAGNOSIS — E1165 Type 2 diabetes mellitus with hyperglycemia: Secondary | ICD-10-CM | POA: Diagnosis not present

## 2023-08-10 DIAGNOSIS — I5032 Chronic diastolic (congestive) heart failure: Secondary | ICD-10-CM | POA: Diagnosis not present

## 2023-08-10 DIAGNOSIS — Z0000 Encounter for general adult medical examination without abnormal findings: Secondary | ICD-10-CM | POA: Diagnosis not present

## 2023-08-10 DIAGNOSIS — Z794 Long term (current) use of insulin: Secondary | ICD-10-CM | POA: Diagnosis not present

## 2023-08-19 DIAGNOSIS — S42294A Other nondisplaced fracture of upper end of right humerus, initial encounter for closed fracture: Secondary | ICD-10-CM | POA: Diagnosis not present

## 2023-08-27 DIAGNOSIS — R296 Repeated falls: Secondary | ICD-10-CM | POA: Diagnosis not present

## 2023-08-27 DIAGNOSIS — I69351 Hemiplegia and hemiparesis following cerebral infarction affecting right dominant side: Secondary | ICD-10-CM | POA: Diagnosis not present

## 2023-08-27 DIAGNOSIS — I509 Heart failure, unspecified: Secondary | ICD-10-CM | POA: Diagnosis not present

## 2023-08-27 DIAGNOSIS — J449 Chronic obstructive pulmonary disease, unspecified: Secondary | ICD-10-CM | POA: Diagnosis not present

## 2023-08-30 DIAGNOSIS — S51801A Unspecified open wound of right forearm, initial encounter: Secondary | ICD-10-CM | POA: Diagnosis not present

## 2023-08-31 DIAGNOSIS — N1832 Chronic kidney disease, stage 3b: Secondary | ICD-10-CM | POA: Diagnosis not present

## 2023-09-06 DIAGNOSIS — E1142 Type 2 diabetes mellitus with diabetic polyneuropathy: Secondary | ICD-10-CM | POA: Diagnosis not present

## 2023-09-06 DIAGNOSIS — M79675 Pain in left toe(s): Secondary | ICD-10-CM | POA: Diagnosis not present

## 2023-09-06 DIAGNOSIS — B351 Tinea unguium: Secondary | ICD-10-CM | POA: Diagnosis not present

## 2023-09-06 DIAGNOSIS — M79674 Pain in right toe(s): Secondary | ICD-10-CM | POA: Diagnosis not present

## 2023-09-06 DIAGNOSIS — L84 Corns and callosities: Secondary | ICD-10-CM | POA: Diagnosis not present

## 2023-09-15 DIAGNOSIS — E1169 Type 2 diabetes mellitus with other specified complication: Secondary | ICD-10-CM | POA: Diagnosis not present

## 2023-09-15 DIAGNOSIS — J9611 Chronic respiratory failure with hypoxia: Secondary | ICD-10-CM | POA: Diagnosis not present

## 2023-09-15 DIAGNOSIS — M6281 Muscle weakness (generalized): Secondary | ICD-10-CM | POA: Diagnosis not present

## 2023-09-15 DIAGNOSIS — I739 Peripheral vascular disease, unspecified: Secondary | ICD-10-CM | POA: Diagnosis not present

## 2023-09-15 DIAGNOSIS — J449 Chronic obstructive pulmonary disease, unspecified: Secondary | ICD-10-CM | POA: Diagnosis not present

## 2023-09-21 DIAGNOSIS — E1122 Type 2 diabetes mellitus with diabetic chronic kidney disease: Secondary | ICD-10-CM | POA: Diagnosis not present

## 2023-09-21 DIAGNOSIS — D509 Iron deficiency anemia, unspecified: Secondary | ICD-10-CM | POA: Diagnosis not present

## 2023-09-21 DIAGNOSIS — I129 Hypertensive chronic kidney disease with stage 1 through stage 4 chronic kidney disease, or unspecified chronic kidney disease: Secondary | ICD-10-CM | POA: Diagnosis not present

## 2023-09-21 DIAGNOSIS — I639 Cerebral infarction, unspecified: Secondary | ICD-10-CM | POA: Diagnosis not present

## 2023-09-21 DIAGNOSIS — R600 Localized edema: Secondary | ICD-10-CM | POA: Diagnosis not present

## 2023-09-21 DIAGNOSIS — N1832 Chronic kidney disease, stage 3b: Secondary | ICD-10-CM | POA: Diagnosis not present

## 2023-09-27 DIAGNOSIS — I69351 Hemiplegia and hemiparesis following cerebral infarction affecting right dominant side: Secondary | ICD-10-CM | POA: Diagnosis not present

## 2023-09-27 DIAGNOSIS — I509 Heart failure, unspecified: Secondary | ICD-10-CM | POA: Diagnosis not present

## 2023-09-27 DIAGNOSIS — R296 Repeated falls: Secondary | ICD-10-CM | POA: Diagnosis not present

## 2023-09-27 DIAGNOSIS — J449 Chronic obstructive pulmonary disease, unspecified: Secondary | ICD-10-CM | POA: Diagnosis not present

## 2023-09-30 DIAGNOSIS — S42294D Other nondisplaced fracture of upper end of right humerus, subsequent encounter for fracture with routine healing: Secondary | ICD-10-CM | POA: Diagnosis not present

## 2023-10-10 ENCOUNTER — Ambulatory Visit: Attending: Orthopedic Surgery | Admitting: Physical Therapy

## 2023-10-10 ENCOUNTER — Other Ambulatory Visit: Payer: Self-pay

## 2023-10-10 DIAGNOSIS — M25511 Pain in right shoulder: Secondary | ICD-10-CM | POA: Diagnosis not present

## 2023-10-10 DIAGNOSIS — R293 Abnormal posture: Secondary | ICD-10-CM | POA: Insufficient documentation

## 2023-10-10 DIAGNOSIS — R29898 Other symptoms and signs involving the musculoskeletal system: Secondary | ICD-10-CM | POA: Diagnosis not present

## 2023-10-10 DIAGNOSIS — M6281 Muscle weakness (generalized): Secondary | ICD-10-CM | POA: Diagnosis not present

## 2023-10-10 DIAGNOSIS — M25611 Stiffness of right shoulder, not elsewhere classified: Secondary | ICD-10-CM | POA: Diagnosis not present

## 2023-10-10 NOTE — Therapy (Signed)
 OUTPATIENT PHYSICAL THERAPY SHOULDER EVALUATION   Patient Name: Chelsea [name] MRN: [id number] DOB:1945/10/30, 78 y.o., female Today's Date: 10/10/2023  END OF SESSION:  PT End of Session - 10/10/23 1245     Visit Number 1    Authorization Type [employer]    PT Start Time 1250    PT Stop Time 1335    PT Time Calculation (min) 45 min    Activity Tolerance Patient tolerated treatment well          Past Medical History:  Diagnosis Date   Allergy    Anemia    Blind left eye    Cardiac arrest (HCC)    Carotid artery occlusion    Cataract    Depression    Diabetes mellitus without complication (HCC)    Hyperlipidemia    Hypertension    Oxygen  deficiency    Stroke [facility])    Past Surgical History:  Procedure Laterality Date   ABDOMINAL HYSTERECTOMY     ENDARTERECTOMY Left 12/03/2019   Procedure: LEFT CAROTID ENDARTERECTOMY;  Surgeon: Eliza Lonni RAMAN, MD;  Location: [facility] OR;  Service: Vascular;  Laterality: Left;   INTRAMEDULLARY (IM) NAIL INTERTROCHANTERIC Right 06/14/2022   Procedure: INTRAMEDULLARY (IM) NAIL INTERTROCHANTERIC, REPAIR OF RIGHT HIP FRACTURE;  Surgeon: Celena Sharper, MD;  Location: [facility] OR;  Service: Orthopedics;  Laterality: Right;   PATCH ANGIOPLASTY Left 12/03/2019   Procedure: PATCH ANGIOPLASTY Left Carotid Artery;  Surgeon: Eliza Lonni RAMAN, MD;  Location: [facility] OR;  Service: Vascular;  Laterality: Left;   Patient Active Problem List   Diagnosis Date Noted   Closed fracture of multiple pubic rami (HCC) 06/10/2023   Fall at home, initial encounter 06/09/2023   Anemia 08/13/2022   Ileus (HCC) 06/20/2022   Hyperkalemia 06/14/2022   PVD (peripheral vascular disease) 06/13/2022   Wheezing 06/13/2022   Hip fracture, right, closed, initial encounter (HCC) 06/12/2022   Fall 06/12/2022   Hyponatremia 10/07/2020   AKI (acute kidney injury) on CKD IV 10/07/2020   Acute kidney injury superimposed on CKD 10/07/2020   Dehydration with  hyponatremia    Syncope 10/06/2020   History of CVA (cerebrovascular accident) 07/10/2020   Pure hypercholesterolemia 07/10/2020   Type 2 diabetes mellitus with complication, without long-term current use of insulin  (HCC) 07/10/2020   Malnutrition of moderate degree 06/27/2020   CKD (chronic kidney disease), stage IV (HCC) 06/25/2020   CAP (community acquired pneumonia) 06/25/2020   Stenosis of left carotid artery 12/03/2019   Stroke [facility])    Pain    Acute ischemic right posterior cerebral artery (PCA) stroke (HCC) 11/09/2019   Essential hypertension    Controlled type 2 diabetes mellitus with hyperglycemia (HCC)    Postherpetic neuralgia    Controlled type 2 diabetes mellitus with hyperglycemia, without long-term current use of insulin  (HCC) 11/07/2019   Hypertensive urgency 11/07/2019   Stenosis of left internal carotid artery 11/07/2019   Intractable nausea and vomiting 11/07/2019   Nicotine dependence, cigarettes, uncomplicated 11/07/2019   HZV (herpes zoster virus) post herpetic neuralgia 11/07/2019   Weakness of right lower extremity 11/07/2019   CVA (cerebral vascular accident) (HCC) 11/07/2019    PCP: Jesus Elberta Gainer, FNP  REFERRING PROVIDER: Gerard Large, PA-C  REFERRING DIAG: s/p R proximal humerus fracture  THERAPY DIAG:  Stiffness of right shoulder, not elsewhere classified  Abnormal posture  Muscle weakness (generalized)  Right shoulder pain, unspecified chronicity  Other symptoms and signs involving the musculoskeletal system  Rationale for Evaluation and Treatment: Rehabilitation  ONSET  DATE: 06/12/23   SUBJECTIVE:                                                                                                                                                                                      SUBJECTIVE STATEMENT: Pt states she broke her arm -- ortho let it heal on it's own with no surgery. Pt reports she can't get her arm up. Had HHPT/HHOT and they  d/c-ed about 2 weeks ago. Has been compliant with HEP. Daughter has gait belt and walker for pt due to history of falls and CVA.  Hand dominance: Right  PERTINENT HISTORY: Per referral: Aggressive ROM; no restrictions or limitations High blood pressure, COPD/asthma, osteopenia, CVA affecting legs and balance R hip surgery 06/12/22  PAIN:  Are you having pain? Yes: NPRS scale: 0 at rest, a little soreness with raising her arm up Pain location: Under R arm Pain description: pulling soreness Aggravating factors: lifting arm Relieving factors: rest  PRECAUTIONS: Fall  RED FLAGS: None   WEIGHT BEARING RESTRICTIONS: No  FALLS:  Has patient fallen in last 6 months? No  LIVING ENVIRONMENT: Lives with: lives with their daughter Lives in: House/apartment Stairs: No Has following equipment at home: Environmental consultant - 2 wheeled  OCCUPATION: Able to d  PLOF: Independent, able to dress herself upper body but needs some assist for lower body, needs assist for amb, able to bathe her upper body; able to do some laundry/fold clothes  PATIENT GOALS:  Improve movement, return to recreational activities, improve strength  NEXT MD VISIT:   OBJECTIVE:  Note: Objective measures were completed at Evaluation unless otherwise noted.  DIAGNOSTIC FINDINGS:  06/09/23 R shoulder x-ray IMPRESSION: Mildly displaced fracture through the right humeral neck and greater tuberosity.  06/09/23 R hip IMPRESSION: Fractures of right superior and inferior pubic rami, new since previous study.   No evidence of acute hip fracture or dislocation.   Osteopenia.  PATIENT SURVEYS:  QUICK DASH  Please rate your ability do the following activities in the last week by selecting the number below the appropriate response.   Activities Rating 10/10/23  Open a tight or new jar.  5 = Unable  Do heavy household chores (e.g., wash walls, floors). 5 = Unable  Carry a shopping bag or briefcase 5 = Unable  Wash your back. 3 =  Moderate difficulty  Use a knife to cut food. 5 = Unable  Recreational activities in which you take some force or impact through your arm, shoulder or hand (e.g., golf, hammering, tennis, etc.). 5 = Unable  During the past week, to what extent has your arm, shoulder or hand problem interfered with your  normal social activities with family, friends, neighbors or groups?  4 = Quite a bit  During the past week, were you limited in your work or other regular daily activities as a result of your arm, shoulder or hand problem? 4 = Very limited  Rate the severity of the following symptoms in the last week: Arm, Shoulder, or hand pain. 1 = none  Rate the severity of the following symptoms in the last week: Tingling (pins and needles) in your arm, shoulder or hand. 1 = none  During the past week, how much difficulty have you had sleeping because of the pain in your arm, shoulder or hand?  2 = Mild difficulty  Total score 65.9   (A QuickDASH score may not be calculated if there is greater than 1 missing item.)  Minimally Clinically Important Difference (MCID): 15-20 points  (Franchignoni, F. et al. (2013). Minimally clinically important difference of the disabilities of the arm, shoulder, and hand outcome measures (DASH) and its shortened version (Quick DASH). Journal of Orthopaedic & Sports Physical Therapy, 44(1), 30-39)   COGNITION: Overall cognitive status: Within functional limits for tasks assessed     SENSATION: WFL  POSTURE: Increased thoracic kyphosis, rounded shoulders, forward head  UPPER EXTREMITY ROM:    AROM/PROM Right eval Left eval  Shoulder flexion A: 75 P: 105 A: 140  Shoulder extension A: 35 A: 48  Shoulder abduction A: 65(scaption) P: 85 A: 120  Shoulder adduction    Shoulder internal rotation A: ~R PSIS A: ~T4  Shoulder external rotation A: -40 at 0 deg abd P: -22 at 0 deg abd A: 32 at 0 deg abd  Elbow flexion    Elbow extension    Wrist flexion    Wrist extension     Wrist ulnar deviation    Wrist radial deviation    Wrist pronation    Wrist supination    (Blank rows = not tested)  UPPER EXTREMITY MMT:  MMT Right eval Left eval  Shoulder flexion 3- 4  Shoulder extension 3- 4  Shoulder abduction 3- 4  Shoulder adduction    Shoulder internal rotation 3- 4  Shoulder external rotation 2+ 4  Middle trapezius    Lower trapezius    Elbow flexion 5 5  Elbow extension 3+ 4  Wrist flexion    Wrist extension    Wrist ulnar deviation    Wrist radial deviation    Wrist pronation    Wrist supination    Grip strength (lbs)    (Blank rows = not tested)  SHOULDER SPECIAL TESTS: Did not assess  JOINT MOBILITY TESTING:  L glenohumeral joint hypomobile in all directions but mostly inferiorly and anteriorly L scapular hypomobility in all directions  PALPATION:  No overt tenderness to palpation  TREATMENT DATE: 10/10/23 See HEP below Manual therapy: grade II GH mobs in all directions, shoulder PROM all directions   PATIENT EDUCATION: Education details: Exam findings, POC, initial HEP Person educated: Patient and Child(ren) Education method: Explanation, Demonstration, and Handouts Education comprehension: verbalized understanding, returned demonstration, and needs further education  HOME EXERCISE PROGRAM: Access Code: [email or web address] URL: [email or web address] Date: 10/10/2023 Prepared by: Majorie Santee April Earnie Starring  Exercises - Seated Shoulder External Rotation AAROM with [place] and Hand in Neutral  - 1 x daily - 7 x weekly - 2 sets - 10 reps - Standing Shoulder Internal Rotation AAROM with [place]  - 1 x daily - 7 x weekly - 2 sets - 10 reps - Seated Scapular Retraction  - 1 x daily - 7 x weekly - 2 sets - 10 reps - Seated Shoulder Horizontal Abduction - Thumbs Up  - 1 x daily - 7 x weekly - 2 sets - 10  reps  ASSESSMENT:  CLINICAL IMPRESSION: Patient is a 78 y.o. F who was seen today for physical therapy evaluation and treatment s/p R humerus fx after a fall 06/12/23. Also had a pelvic fx after her fall. PMH is significant for CVA with LE weakness and high fall risk. Assessment is significant for very limited R shoulder AROM/PROM due to both joint and muscle tightness and UE weakness affecting pt's ability to perform ADLs and IADLs at home. Pt will greatly benefit from PT to address these deficits and return to prior level of function. Discussed with daughter getting PT referral for LEs too if needed.   OBJECTIVE IMPAIRMENTS: decreased activity tolerance, decreased endurance, decreased mobility, decreased ROM, decreased strength, increased fascial restrictions, impaired flexibility, impaired UE functional use, improper body mechanics, postural dysfunction, and pain.   ACTIVITY LIMITATIONS: carrying, lifting, bathing, toileting, dressing, reach over head, and hygiene/grooming  PARTICIPATION LIMITATIONS: cleaning and laundry  PERSONAL FACTORS: Age, Fitness, Past/current experiences, and Time since onset of injury/illness/exacerbation are also affecting patient's functional outcome.   REHAB POTENTIAL: Good  CLINICAL DECISION MAKING: Evolving/moderate complexity  EVALUATION COMPLEXITY: Moderate   GOALS: Goals reviewed with patient? Yes  SHORT TERM GOALS: Target date: 11/07/2023    Pt will be ind with initial HEP Baseline: Goal status: INITIAL  2.  Pt will be able to get shoulder ER to at least neutral at 0 deg of shoulder abd Baseline: 40 deg AROM away from neutral Goal status: INITIAL    LONG TERM GOALS: Target date: 12/05/2023    Pt will be ind with management and progression of HEP Baseline:  Goal status: INITIAL  2.  Pt will have improved shoulder flexion and abd to >/=120 deg and shoulder ER >/=30 deg actively for overhead reaching Baseline:  Goal status:  INITIAL  3.  Pt will have improved QuickDASH to </=55.9 to demo MCID Baseline:  Goal status: INITIAL  4.  Pt will be able to lift and carry at least 2# to demo increasing L UE strength for self care tasks Baseline:  Goal status: INITIAL   PLAN:  PT FREQUENCY: 2x/week  PT DURATION: 8 weeks  PLANNED INTERVENTIONS: 97164- PT Re-evaluation, 97750- Physical Performance Testing, 97110-Therapeutic exercises, 97530- Therapeutic activity, 97112- Neuromuscular re-education, 97535- Self Care, 02859- Manual therapy, [id number]- Electrical stimulation (unattended), 97016- Vasopneumatic device, [id number]- Ultrasound, [id number]- Ionotophoresis 4mg /ml Dexamethasone , 79439 (1-2 muscles), 20561 (3+ muscles)- Dry Needling, Patient/Family education, Balance training, Taping, Joint mobilization, Spinal mobilization, Cryotherapy, and Moist heat  PLAN FOR NEXT SESSION: Assess response to HEP. Continue manual work for  R shoulder joint mobilization, PROM/manual stretching. Progress AAROM exercises. Initiate strengthening.    Makaria Poarch April Ma L Taisia Fantini, PT 10/10/2023, 2:01 PM

## 2023-10-11 ENCOUNTER — Other Ambulatory Visit: Payer: Self-pay | Admitting: Pulmonary Disease

## 2023-10-12 ENCOUNTER — Telehealth: Payer: Self-pay | Admitting: Pulmonary Disease

## 2023-10-12 NOTE — Telephone Encounter (Signed)
 Copied from CRM [id number]. Topic: Clinical - Order For Equipment >> Oct 11, 2023  4:47 PM Joesph PARAS wrote: Reason for CRM: Patient's daughter is calling to request we relay the date of her first visit and her next visit to [employer] for oxygen  certification purposes. >> Oct 12, 2023 10:11 AM Mary-Hannah B wrote: LVM for Avelina at [employer] to follow up on what I need to send. Will inform once I hear back >> Oct 12, 2023  9:14 AM Leonor MARLA Presume wrote: Notified front staff for f/u appt scheduling.   Can you please advise or assist with providing [employer] with needed documentation.   Waiting to hear more from Mitch in what is needed.

## 2023-10-13 ENCOUNTER — Ambulatory Visit: Attending: Orthopedic Surgery | Admitting: *Deleted

## 2023-10-13 ENCOUNTER — Encounter: Payer: Self-pay | Admitting: *Deleted

## 2023-10-13 DIAGNOSIS — M25511 Pain in right shoulder: Secondary | ICD-10-CM | POA: Insufficient documentation

## 2023-10-13 DIAGNOSIS — M25611 Stiffness of right shoulder, not elsewhere classified: Secondary | ICD-10-CM | POA: Diagnosis not present

## 2023-10-13 DIAGNOSIS — R238 Other skin changes: Secondary | ICD-10-CM | POA: Diagnosis not present

## 2023-10-13 DIAGNOSIS — R293 Abnormal posture: Secondary | ICD-10-CM | POA: Insufficient documentation

## 2023-10-13 DIAGNOSIS — R29898 Other symptoms and signs involving the musculoskeletal system: Secondary | ICD-10-CM | POA: Insufficient documentation

## 2023-10-13 DIAGNOSIS — M6281 Muscle weakness (generalized): Secondary | ICD-10-CM | POA: Insufficient documentation

## 2023-10-13 NOTE — Therapy (Signed)
 OUTPATIENT PHYSICAL THERAPY SHOULDER EVALUATION   Patient Name: Chelsea [name] MRN: [id number] DOB:26-Dec-1945, 78 y.o., female Today's Date: 10/13/2023  END OF SESSION:  PT End of Session - 10/13/23 1015     Visit Number 2    Number of Visits 16    Date for Recertification  12/05/23    Authorization Type [employer]    PT Start Time 1015    PT Stop Time 1105    PT Time Calculation (min) 50 min          Past Medical History:  Diagnosis Date   Allergy    Anemia    Blind left eye    Cardiac arrest (HCC)    Carotid artery occlusion    Cataract    Depression    Diabetes mellitus without complication (HCC)    Hyperlipidemia    Hypertension    Oxygen  deficiency    Stroke [facility])    Past Surgical History:  Procedure Laterality Date   ABDOMINAL HYSTERECTOMY     ENDARTERECTOMY Left 12/03/2019   Procedure: LEFT CAROTID ENDARTERECTOMY;  Surgeon: Eliza Lonni RAMAN, MD;  Location: [facility] OR;  Service: Vascular;  Laterality: Left;   INTRAMEDULLARY (IM) NAIL INTERTROCHANTERIC Right 06/14/2022   Procedure: INTRAMEDULLARY (IM) NAIL INTERTROCHANTERIC, REPAIR OF RIGHT HIP FRACTURE;  Surgeon: Celena Sharper, MD;  Location: [facility] OR;  Service: Orthopedics;  Laterality: Right;   PATCH ANGIOPLASTY Left 12/03/2019   Procedure: PATCH ANGIOPLASTY Left Carotid Artery;  Surgeon: Eliza Lonni RAMAN, MD;  Location: [facility] OR;  Service: Vascular;  Laterality: Left;   Patient Active Problem List   Diagnosis Date Noted   Closed fracture of multiple pubic rami (HCC) 06/10/2023   Fall at home, initial encounter 06/09/2023   Anemia 08/13/2022   Ileus (HCC) 06/20/2022   Hyperkalemia 06/14/2022   PVD (peripheral vascular disease) 06/13/2022   Wheezing 06/13/2022   Hip fracture, right, closed, initial encounter (HCC) 06/12/2022   Fall 06/12/2022   Hyponatremia 10/07/2020   AKI (acute kidney injury) on CKD IV 10/07/2020   Acute kidney injury superimposed on CKD 10/07/2020   Dehydration with  hyponatremia    Syncope 10/06/2020   History of CVA (cerebrovascular accident) 07/10/2020   Pure hypercholesterolemia 07/10/2020   Type 2 diabetes mellitus with complication, without long-term current use of insulin  (HCC) 07/10/2020   Malnutrition of moderate degree 06/27/2020   CKD (chronic kidney disease), stage IV (HCC) 06/25/2020   CAP (community acquired pneumonia) 06/25/2020   Stenosis of left carotid artery 12/03/2019   Stroke [facility])    Pain    Acute ischemic right posterior cerebral artery (PCA) stroke (HCC) 11/09/2019   Essential hypertension    Controlled type 2 diabetes mellitus with hyperglycemia (HCC)    Postherpetic neuralgia    Controlled type 2 diabetes mellitus with hyperglycemia, without long-term current use of insulin  (HCC) 11/07/2019   Hypertensive urgency 11/07/2019   Stenosis of left internal carotid artery 11/07/2019   Intractable nausea and vomiting 11/07/2019   Nicotine dependence, cigarettes, uncomplicated 11/07/2019   HZV (herpes zoster virus) post herpetic neuralgia 11/07/2019   Weakness of right lower extremity 11/07/2019   CVA (cerebral vascular accident) (HCC) 11/07/2019    PCP: Jesus Elberta Gainer, FNP  REFERRING PROVIDER: Gerard Large, PA-C  REFERRING DIAG: s/p R proximal humerus fracture  THERAPY DIAG:  Stiffness of right shoulder, not elsewhere classified  Abnormal posture  Muscle weakness (generalized)  Right shoulder pain, unspecified chronicity  Rationale for Evaluation and Treatment: Rehabilitation  ONSET DATE: 06/12/23  SUBJECTIVE:                                                                                                                                                                                      SUBJECTIVE STATEMENT: Pt states she broke her arm , but no surgery. Doing okay today   Hand dominance: Right  PERTINENT HISTORY: Per referral: Aggressive ROM; no restrictions or limitations High blood pressure,  COPD/asthma, osteopenia, CVA affecting legs and balance R hip surgery 06/12/22  PAIN:  Are you having pain? Yes: NPRS scale: 0 at rest, a little soreness with raising her arm up Pain location: Under R arm Pain description: pulling soreness Aggravating factors: lifting arm Relieving factors: rest  PRECAUTIONS: Fall  RED FLAGS: None   WEIGHT BEARING RESTRICTIONS: No  FALLS:  Has patient fallen in last 6 months? No  LIVING ENVIRONMENT: Lives with: lives with their daughter Lives in: House/apartment Stairs: No Has following equipment at home: Environmental consultant - 2 wheeled  OCCUPATION: Able to d  PLOF: Independent, able to dress herself upper body but needs some assist for lower body, needs assist for amb, able to bathe her upper body; able to do some laundry/fold clothes  PATIENT GOALS:  Improve movement, return to recreational activities, improve strength  NEXT MD VISIT:   OBJECTIVE:  Note: Objective measures were completed at Evaluation unless otherwise noted.  DIAGNOSTIC FINDINGS:  06/09/23 R shoulder x-ray IMPRESSION: Mildly displaced fracture through the right humeral neck and greater tuberosity.  06/09/23 R hip IMPRESSION: Fractures of right superior and inferior pubic rami, new since previous study.   No evidence of acute hip fracture or dislocation.   Osteopenia.  PATIENT SURVEYS:  QUICK DASH  Please rate your ability do the following activities in the last week by selecting the number below the appropriate response.   Activities Rating 10/10/23  Open a tight or new jar.  5 = Unable  Do heavy household chores (e.g., wash walls, floors). 5 = Unable  Carry a shopping bag or briefcase 5 = Unable  Wash your back. 3 = Moderate difficulty  Use a knife to cut food. 5 = Unable  Recreational activities in which you take some force or impact through your arm, shoulder or hand (e.g., golf, hammering, tennis, etc.). 5 = Unable  During the past week, to what extent has your  arm, shoulder or hand problem interfered with your normal social activities with family, friends, neighbors or groups?  4 = Quite a bit  During the past week, were you limited in your work or other regular daily activities as a result of your arm, shoulder or hand problem? 4 = Very  limited  Rate the severity of the following symptoms in the last week: Arm, Shoulder, or hand pain. 1 = none  Rate the severity of the following symptoms in the last week: Tingling (pins and needles) in your arm, shoulder or hand. 1 = none  During the past week, how much difficulty have you had sleeping because of the pain in your arm, shoulder or hand?  2 = Mild difficulty  Total score 65.9   (A QuickDASH score may not be calculated if there is greater than 1 missing item.)  Minimally Clinically Important Difference (MCID): 15-20 points  (Franchignoni, F. et al. (2013). Minimally clinically important difference of the disabilities of the arm, shoulder, and hand outcome measures (DASH) and its shortened version (Quick DASH). Journal of Orthopaedic & Sports Physical Therapy, 44(1), 30-39)   COGNITION: Overall cognitive status: Within functional limits for tasks assessed     SENSATION: WFL  POSTURE: Increased thoracic kyphosis, rounded shoulders, forward head  UPPER EXTREMITY ROM:    AROM/PROM Right eval Left eval  Shoulder flexion A: 75 P: 105 A: 140  Shoulder extension A: 35 A: 48  Shoulder abduction A: 65(scaption) P: 85 A: 120  Shoulder adduction    Shoulder internal rotation A: ~R PSIS A: ~T4  Shoulder external rotation A: -40 at 0 deg abd P: -22 at 0 deg abd A: 32 at 0 deg abd  Elbow flexion    Elbow extension    Wrist flexion    Wrist extension    Wrist ulnar deviation    Wrist radial deviation    Wrist pronation    Wrist supination    (Blank rows = not tested)  UPPER EXTREMITY MMT:  MMT Right eval Left eval  Shoulder flexion 3- 4  Shoulder extension 3- 4  Shoulder abduction 3- 4   Shoulder adduction    Shoulder internal rotation 3- 4  Shoulder external rotation 2+ 4  Middle trapezius    Lower trapezius    Elbow flexion 5 5  Elbow extension 3+ 4  Wrist flexion    Wrist extension    Wrist ulnar deviation    Wrist radial deviation    Wrist pronation    Wrist supination    Grip strength (lbs)    (Blank rows = not tested)  SHOULDER SPECIAL TESTS: Did not assess  JOINT MOBILITY TESTING:  L glenohumeral joint hypomobile in all directions but mostly inferiorly and anteriorly L scapular hypomobility in all directions  PALPATION:  No overt tenderness to palpation                                                                                                                             TREATMENT DATE:   10-13-23                                    EXERCISE LOG  RT shldr  Exercise Repetitions and Resistance Comments  Seated UE ranger X 5 mins  some AAROM for guidence   Pulleys X 5 mins  some assist with RT hand                 Blank cell = exercise not performed today  Reviewed HEP  Manual AAROM for elevation and ER. IFC x 15 mins 80-120hz  with HMP seated RT shldr   10/10/23 See HEP below Manual therapy: grade II GH mobs in all directions, shoulder PROM all directions   PATIENT EDUCATION: Education details: Exam findings, POC, initial HEP Person educated: Patient and Child(ren) Education method: Explanation, Demonstration, and Handouts Education comprehension: verbalized understanding, returned demonstration, and needs further education  HOME EXERCISE PROGRAM: Access Code: [email or web address] URL: [email or web address] Date: 10/10/2023 Prepared by: Gellen April Earnie Starring  Exercises - Seated Shoulder External Rotation AAROM with [place] and Hand in Neutral  - 1 x daily - 7 x weekly - 2 sets - 10 reps - Standing Shoulder Internal Rotation AAROM with [place]  - 1 x daily - 7 x weekly - 2 sets - 10 reps - Seated Scapular Retraction  - 1 x  daily - 7 x weekly - 2 sets - 10 reps - Seated Shoulder Horizontal Abduction - Thumbs Up  - 1 x daily - 7 x weekly - 2 sets - 10 reps  ASSESSMENT:  CLINICAL IMPRESSION: Patient is a 78 y.o. F who was seen today for physical therapy evaluation and treatment s/p R humerus fx after a fall 06/12/23.Pt arrived today doing fairly well and in good spirits. She did good with AAROM exs and manual AAROM. IFC and HMP end of session for pain. ER ROM challenging for Pt.    OBJECTIVE IMPAIRMENTS: decreased activity tolerance, decreased endurance, decreased mobility, decreased ROM, decreased strength, increased fascial restrictions, impaired flexibility, impaired UE functional use, improper body mechanics, postural dysfunction, and pain.   ACTIVITY LIMITATIONS: carrying, lifting, bathing, toileting, dressing, reach over head, and hygiene/grooming  PARTICIPATION LIMITATIONS: cleaning and laundry  PERSONAL FACTORS: Age, Fitness, Past/current experiences, and Time since onset of injury/illness/exacerbation are also affecting patient's functional outcome.   REHAB POTENTIAL: Good  CLINICAL DECISION MAKING: Evolving/moderate complexity  EVALUATION COMPLEXITY: Moderate   GOALS: Goals reviewed with patient? Yes  SHORT TERM GOALS: Target date: 11/07/2023    Pt will be ind with initial HEP Baseline: Goal status: INITIAL  2.  Pt will be able to get shoulder ER to at least neutral at 0 deg of shoulder abd Baseline: 40 deg AROM away from neutral Goal status: INITIAL    LONG TERM GOALS: Target date: 12/05/2023    Pt will be ind with management and progression of HEP Baseline:  Goal status: INITIAL  2.  Pt will have improved shoulder flexion and abd to >/=120 deg and shoulder ER >/=30 deg actively for overhead reaching Baseline:  Goal status: INITIAL  3.  Pt will have improved QuickDASH to </=55.9 to demo MCID Baseline:  Goal status: INITIAL  4.  Pt will be able to lift and carry at least  2# to demo increasing L UE strength for self care tasks Baseline:  Goal status: INITIAL   PLAN:  PT FREQUENCY: 2x/week  PT DURATION: 8 weeks  PLANNED INTERVENTIONS: 97164- PT Re-evaluation, 97750- Physical Performance Testing, 97110-Therapeutic exercises, 97530- Therapeutic activity, [id number]- Neuromuscular re-education, 97535- Self Care, [id number]- Manual therapy, [id number]- Electrical stimulation (unattended), 97016- Vasopneumatic device, [id number]- Ultrasound, [id number]- Ionotophoresis 4mg /ml Dexamethasone , 79439 (1-2 muscles), 79438 (  3+ muscles)- Dry Needling, Patient/Family education, Balance training, Taping, Joint mobilization, Spinal mobilization, Cryotherapy, and Moist heat  PLAN FOR NEXT SESSION: Assess response to HEP. Continue manual work for R shoulder joint mobilization, PROM/manual stretching. Progress AAROM exercises. Initiate strengthening.    Gayle Collard,CHRIS, PTA 10/13/2023, 5:18 PM

## 2023-10-17 ENCOUNTER — Ambulatory Visit: Admitting: *Deleted

## 2023-10-17 ENCOUNTER — Encounter: Payer: Self-pay | Admitting: *Deleted

## 2023-10-17 DIAGNOSIS — M25611 Stiffness of right shoulder, not elsewhere classified: Secondary | ICD-10-CM | POA: Diagnosis not present

## 2023-10-17 DIAGNOSIS — M6281 Muscle weakness (generalized): Secondary | ICD-10-CM

## 2023-10-17 DIAGNOSIS — R293 Abnormal posture: Secondary | ICD-10-CM

## 2023-10-17 DIAGNOSIS — M25511 Pain in right shoulder: Secondary | ICD-10-CM

## 2023-10-17 NOTE — Therapy (Signed)
 OUTPATIENT PHYSICAL THERAPY SHOULDER EVALUATION   Patient Name: Chelsea [name] MRN: [id number] DOB:[birth year], 78 y.o., female Today's Date: 10/17/2023  END OF SESSION:  PT End of Session - 10/17/23 1030     Visit Number 3    Number of Visits 16    Date for Recertification  12/05/23    Authorization Type [employer]    PT Start Time 1018    PT Stop Time 1105    PT Time Calculation (min) 47 min          Past Medical History:  Diagnosis Date   Allergy    Anemia    Blind left eye    Cardiac arrest (HCC)    Carotid artery occlusion    Cataract    Depression    Diabetes mellitus without complication (HCC)    Hyperlipidemia    Hypertension    Oxygen  deficiency    Stroke [facility])    Past Surgical History:  Procedure Laterality Date   ABDOMINAL HYSTERECTOMY     ENDARTERECTOMY Left 12/03/2019   Procedure: LEFT CAROTID ENDARTERECTOMY;  Surgeon: Eliza Lonni RAMAN, MD;  Location: [facility] OR;  Service: Vascular;  Laterality: Left;   INTRAMEDULLARY (IM) NAIL INTERTROCHANTERIC Right 06/14/2022   Procedure: INTRAMEDULLARY (IM) NAIL INTERTROCHANTERIC, REPAIR OF RIGHT HIP FRACTURE;  Surgeon: Celena Sharper, MD;  Location: [facility] OR;  Service: Orthopedics;  Laterality: Right;   PATCH ANGIOPLASTY Left 12/03/2019   Procedure: PATCH ANGIOPLASTY Left Carotid Artery;  Surgeon: Eliza Lonni RAMAN, MD;  Location: [facility] OR;  Service: Vascular;  Laterality: Left;   Patient Active Problem List   Diagnosis Date Noted   Closed fracture of multiple pubic rami (HCC) 06/10/2023   Fall at home, initial encounter 06/09/2023   Anemia 08/13/2022   Ileus (HCC) 06/20/2022   Hyperkalemia 06/14/2022   PVD (peripheral vascular disease) 06/13/2022   Wheezing 06/13/2022   Hip fracture, right, closed, initial encounter (HCC) 06/12/2022   Fall 06/12/2022   Hyponatremia 10/07/2020   AKI (acute kidney injury) on CKD IV 10/07/2020   Acute kidney injury superimposed on CKD 10/07/2020   Dehydration with  hyponatremia    Syncope 10/06/2020   History of CVA (cerebrovascular accident) 07/10/2020   Pure hypercholesterolemia 07/10/2020   Type 2 diabetes mellitus with complication, without long-term current use of insulin  (HCC) 07/10/2020   Malnutrition of moderate degree 06/27/2020   CKD (chronic kidney disease), stage IV (HCC) 06/25/2020   CAP (community acquired pneumonia) 06/25/2020   Stenosis of left carotid artery 12/03/2019   Stroke [facility])    Pain    Acute ischemic right posterior cerebral artery (PCA) stroke (HCC) 11/09/2019   Essential hypertension    Controlled type 2 diabetes mellitus with hyperglycemia (HCC)    Postherpetic neuralgia    Controlled type 2 diabetes mellitus with hyperglycemia, without long-term current use of insulin  (HCC) 11/07/2019   Hypertensive urgency 11/07/2019   Stenosis of left internal carotid artery 11/07/2019   Intractable nausea and vomiting 11/07/2019   Nicotine dependence, cigarettes, uncomplicated 11/07/2019   HZV (herpes zoster virus) post herpetic neuralgia 11/07/2019   Weakness of right lower extremity 11/07/2019   CVA (cerebral vascular accident) (HCC) 11/07/2019    PCP: Jesus Elberta Gainer, FNP  REFERRING PROVIDER: Gerard Large, PA-C  REFERRING DIAG: s/p R proximal humerus fracture  THERAPY DIAG:  Stiffness of right shoulder, not elsewhere classified  Abnormal posture  Muscle weakness (generalized)  Right shoulder pain, unspecified chronicity  Rationale for Evaluation and Treatment: Rehabilitation  ONSET DATE: 06/12/23  SUBJECTIVE:                                                                                                                                                                                      SUBJECTIVE STATEMENT: Did okay after last Rx   Hand dominance: Right  PERTINENT HISTORY: Per referral: Aggressive ROM; no restrictions or limitations High blood pressure, COPD/asthma, osteopenia, CVA affecting legs  and balance R hip surgery 06/12/22  PAIN:  Are you having pain? Yes: NPRS scale: 0 at rest, a little soreness with raising her arm up Pain location: Under R arm Pain description: pulling soreness Aggravating factors: lifting arm Relieving factors: rest  PRECAUTIONS: Fall  RED FLAGS: None   WEIGHT BEARING RESTRICTIONS: No  FALLS:  Has patient fallen in last 6 months? No  LIVING ENVIRONMENT: Lives with: lives with their daughter Lives in: House/apartment Stairs: No Has following equipment at home: Environmental consultant - 2 wheeled  OCCUPATION: Able to d  PLOF: Independent, able to dress herself upper body but needs some assist for lower body, needs assist for amb, able to bathe her upper body; able to do some laundry/fold clothes  PATIENT GOALS:  Improve movement, return to recreational activities, improve strength  NEXT MD VISIT:   OBJECTIVE:  Note: Objective measures were completed at Evaluation unless otherwise noted.  DIAGNOSTIC FINDINGS:  06/09/23 R shoulder x-ray IMPRESSION: Mildly displaced fracture through the right humeral neck and greater tuberosity.  06/09/23 R hip IMPRESSION: Fractures of right superior and inferior pubic rami, new since previous study.   No evidence of acute hip fracture or dislocation.   Osteopenia.  PATIENT SURVEYS:  QUICK DASH  Please rate your ability do the following activities in the last week by selecting the number below the appropriate response.   Activities Rating 10/10/23  Open a tight or new jar.  5 = Unable  Do heavy household chores (e.g., wash walls, floors). 5 = Unable  Carry a shopping bag or briefcase 5 = Unable  Wash your back. 3 = Moderate difficulty  Use a knife to cut food. 5 = Unable  Recreational activities in which you take some force or impact through your arm, shoulder or hand (e.g., golf, hammering, tennis, etc.). 5 = Unable  During the past week, to what extent has your arm, shoulder or hand problem interfered with  your normal social activities with family, friends, neighbors or groups?  4 = Quite a bit  During the past week, were you limited in your work or other regular daily activities as a result of your arm, shoulder or hand problem? 4 = Very limited  Rate the severity of the following  symptoms in the last week: Arm, Shoulder, or hand pain. 1 = none  Rate the severity of the following symptoms in the last week: Tingling (pins and needles) in your arm, shoulder or hand. 1 = none  During the past week, how much difficulty have you had sleeping because of the pain in your arm, shoulder or hand?  2 = Mild difficulty  Total score 65.9   (A QuickDASH score may not be calculated if there is greater than 1 missing item.)  Minimally Clinically Important Difference (MCID): 15-20 points  (Franchignoni, F. et al. (2013). Minimally clinically important difference of the disabilities of the arm, shoulder, and hand outcome measures (DASH) and its shortened version (Quick DASH). Journal of Orthopaedic & Sports Physical Therapy, 44(1), 30-39)   COGNITION: Overall cognitive status: Within functional limits for tasks assessed     SENSATION: WFL  POSTURE: Increased thoracic kyphosis, rounded shoulders, forward head  UPPER EXTREMITY ROM:    AROM/PROM Right eval Left eval  Shoulder flexion A: 75 P: 105 A: 140  Shoulder extension A: 35 A: 48  Shoulder abduction A: 65(scaption) P: 85 A: 120  Shoulder adduction    Shoulder internal rotation A: ~R PSIS A: ~T4  Shoulder external rotation A: -40 at 0 deg abd P: -22 at 0 deg abd A: 32 at 0 deg abd  Elbow flexion    Elbow extension    Wrist flexion    Wrist extension    Wrist ulnar deviation    Wrist radial deviation    Wrist pronation    Wrist supination    (Blank rows = not tested)  UPPER EXTREMITY MMT:  MMT Right eval Left eval  Shoulder flexion 3- 4  Shoulder extension 3- 4  Shoulder abduction 3- 4  Shoulder adduction    Shoulder internal  rotation 3- 4  Shoulder external rotation 2+ 4  Middle trapezius    Lower trapezius    Elbow flexion 5 5  Elbow extension 3+ 4  Wrist flexion    Wrist extension    Wrist ulnar deviation    Wrist radial deviation    Wrist pronation    Wrist supination    Grip strength (lbs)    (Blank rows = not tested)  SHOULDER SPECIAL TESTS: Did not assess  JOINT MOBILITY TESTING:  L glenohumeral joint hypomobile in all directions but mostly inferiorly and anteriorly L scapular hypomobility in all directions  PALPATION:  No overt tenderness to palpation                                                                                                                             TREATMENT DATE:   10-17-23                                    EXERCISE LOG    RT shldr  FX  Exercise Repetitions  and Resistance Comments  Seated UE ranger X 6 mins  some AAROM for guidence   Pulleys X 6 mins  some assist with RT hand    ER isometrics 3x10 hold 5 secs            Blank cell = exercise not performed today  Reviewed HEP  Manual AAROM for elevation and ER, as well as ER mm activation isometrics. Added to HEP ER isometrics using other hand or couch pillow IFC x 15 mins 80-120hz  with HMP seated RT shldr   10/10/23 See HEP below Manual therapy: grade II GH mobs in all directions, shoulder PROM all directions   PATIENT EDUCATION: Education details: Exam findings, POC, initial HEP Person educated: Patient and Child(ren) Education method: Explanation, Demonstration, and Handouts Education comprehension: verbalized understanding, returned demonstration, and needs further education  HOME EXERCISE PROGRAM: Access Code: [email or web address] URL: [email or web address] Date: 10/10/2023 Prepared by: Gellen April Earnie Starring  Exercises - Seated Shoulder External Rotation AAROM with [place] and Hand in Neutral  - 1 x daily - 7 x weekly - 2 sets - 10 reps - Standing Shoulder Internal Rotation AAROM with  Dowel  - 1 x daily - 7 x weekly - 2 sets - 10 reps - Seated Scapular Retraction  - 1 x daily - 7 x weekly - 2 sets - 10 reps - Seated Shoulder Horizontal Abduction - Thumbs Up  - 1 x daily - 7 x weekly - 2 sets - 10 reps  ASSESSMENT:  CLINICAL IMPRESSION: Patient   arrived today doing fairly well with RT shldr with no pain at rest and 2-3/10 with elevation. Rx focused again on AAROM exs as well as added isometrics and manual AAROM in elevation and ER and did well.     OBJECTIVE IMPAIRMENTS: decreased activity tolerance, decreased endurance, decreased mobility, decreased ROM, decreased strength, increased fascial restrictions, impaired flexibility, impaired UE functional use, improper body mechanics, postural dysfunction, and pain.   ACTIVITY LIMITATIONS: carrying, lifting, bathing, toileting, dressing, reach over head, and hygiene/grooming  PARTICIPATION LIMITATIONS: cleaning and laundry  PERSONAL FACTORS: Age, Fitness, Past/current experiences, and Time since onset of injury/illness/exacerbation are also affecting patient's functional outcome.   REHAB POTENTIAL: Good  CLINICAL DECISION MAKING: Evolving/moderate complexity  EVALUATION COMPLEXITY: Moderate   GOALS: Goals reviewed with patient? Yes  SHORT TERM GOALS: Target date: 11/07/2023    Pt will be ind with initial HEP Baseline: Goal status: INITIAL  2.  Pt will be able to get shoulder ER to at least neutral at 0 deg of shoulder abd Baseline: 40 deg AROM away from neutral Goal status: INITIAL    LONG TERM GOALS: Target date: 12/05/2023    Pt will be ind with management and progression of HEP Baseline:  Goal status: INITIAL  2.  Pt will have improved shoulder flexion and abd to >/=120 deg and shoulder ER >/=30 deg actively for overhead reaching Baseline:  Goal status: INITIAL  3.  Pt will have improved QuickDASH to </=55.9 to demo MCID Baseline:  Goal status: INITIAL  4.  Pt will be able to lift and  carry at least 2# to demo increasing L UE strength for self care tasks Baseline:  Goal status: INITIAL   PLAN:  PT FREQUENCY: 2x/week  PT DURATION: 8 weeks  PLANNED INTERVENTIONS: 97164- PT Re-evaluation, 97750- Physical Performance Testing, 97110-Therapeutic exercises, 97530- Therapeutic activity, [id number]- Neuromuscular re-education, 97535- Self Care, 02859- Manual therapy, [id number]- Electrical stimulation (unattended), 97016- Vasopneumatic device, [id number]- Ultrasound, 02966- Ionotophoresis 4mg /ml Dexamethasone ,  79439 (1-2 muscles), 20561 (3+ muscles)- Dry Needling, Patient/Family education, Balance training, Taping, Joint mobilization, Spinal mobilization, Cryotherapy, and Moist heat  PLAN FOR NEXT SESSION: Assess response to HEP. Continue manual work for R shoulder joint mobilization, PROM/manual stretching. Progress AAROM exercises. Initiate strengthening.    Havah Ammon,CHRIS, PTA 10/17/2023, 1:30 PM

## 2023-10-20 ENCOUNTER — Encounter: Payer: Self-pay | Admitting: Physical Therapy

## 2023-10-20 ENCOUNTER — Ambulatory Visit: Admitting: Physical Therapy

## 2023-10-20 DIAGNOSIS — M25611 Stiffness of right shoulder, not elsewhere classified: Secondary | ICD-10-CM | POA: Diagnosis not present

## 2023-10-20 DIAGNOSIS — R29898 Other symptoms and signs involving the musculoskeletal system: Secondary | ICD-10-CM

## 2023-10-20 DIAGNOSIS — M25511 Pain in right shoulder: Secondary | ICD-10-CM

## 2023-10-20 DIAGNOSIS — R293 Abnormal posture: Secondary | ICD-10-CM

## 2023-10-20 DIAGNOSIS — M6281 Muscle weakness (generalized): Secondary | ICD-10-CM

## 2023-10-20 NOTE — Therapy (Signed)
 OUTPATIENT PHYSICAL THERAPY TREATMENT   Patient Name: Chelsea [name] MRN: [id number] DOB:08-Mar-1945, 78 y.o., female Today's Date: 10/20/2023  END OF SESSION:  PT End of Session - 10/20/23 1103     Visit Number 4    Number of Visits 16    Date for Recertification  12/05/23    Authorization Type [employer]    PT Start Time 1016    PT Stop Time 1100    PT Time Calculation (min) 44 min           Past Medical History:  Diagnosis Date   Allergy    Anemia    Blind left eye    Cardiac arrest (HCC)    Carotid artery occlusion    Cataract    Depression    Diabetes mellitus without complication (HCC)    Hyperlipidemia    Hypertension    Oxygen  deficiency    Stroke [facility])    Past Surgical History:  Procedure Laterality Date   ABDOMINAL HYSTERECTOMY     ENDARTERECTOMY Left 12/03/2019   Procedure: LEFT CAROTID ENDARTERECTOMY;  Surgeon: Eliza Lonni RAMAN, MD;  Location: [facility] OR;  Service: Vascular;  Laterality: Left;   INTRAMEDULLARY (IM) NAIL INTERTROCHANTERIC Right 06/14/2022   Procedure: INTRAMEDULLARY (IM) NAIL INTERTROCHANTERIC, REPAIR OF RIGHT HIP FRACTURE;  Surgeon: Celena Sharper, MD;  Location: [facility] OR;  Service: Orthopedics;  Laterality: Right;   PATCH ANGIOPLASTY Left 12/03/2019   Procedure: PATCH ANGIOPLASTY Left Carotid Artery;  Surgeon: Eliza Lonni RAMAN, MD;  Location: [facility] OR;  Service: Vascular;  Laterality: Left;   Patient Active Problem List   Diagnosis Date Noted   Closed fracture of multiple pubic rami (HCC) 06/10/2023   Fall at home, initial encounter 06/09/2023   Anemia 08/13/2022   Ileus (HCC) 06/20/2022   Hyperkalemia 06/14/2022   PVD (peripheral vascular disease) 06/13/2022   Wheezing 06/13/2022   Hip fracture, right, closed, initial encounter (HCC) 06/12/2022   Fall 06/12/2022   Hyponatremia 10/07/2020   AKI (acute kidney injury) on CKD IV 10/07/2020   Acute kidney injury superimposed on CKD 10/07/2020   Dehydration with  hyponatremia    Syncope 10/06/2020   History of CVA (cerebrovascular accident) 07/10/2020   Pure hypercholesterolemia 07/10/2020   Type 2 diabetes mellitus with complication, without long-term current use of insulin  (HCC) 07/10/2020   Malnutrition of moderate degree 06/27/2020   CKD (chronic kidney disease), stage IV (HCC) 06/25/2020   CAP (community acquired pneumonia) 06/25/2020   Stenosis of left carotid artery 12/03/2019   Stroke [facility])    Pain    Acute ischemic right posterior cerebral artery (PCA) stroke (HCC) 11/09/2019   Essential hypertension    Controlled type 2 diabetes mellitus with hyperglycemia (HCC)    Postherpetic neuralgia    Controlled type 2 diabetes mellitus with hyperglycemia, without long-term current use of insulin  (HCC) 11/07/2019   Hypertensive urgency 11/07/2019   Stenosis of left internal carotid artery 11/07/2019   Intractable nausea and vomiting 11/07/2019   Nicotine dependence, cigarettes, uncomplicated 11/07/2019   HZV (herpes zoster virus) post herpetic neuralgia 11/07/2019   Weakness of right lower extremity 11/07/2019   CVA (cerebral vascular accident) (HCC) 11/07/2019    PCP: Jesus Elberta Gainer, FNP  REFERRING PROVIDER: Gerard Large, PA-C  REFERRING DIAG: s/p R proximal humerus fracture  THERAPY DIAG:  Stiffness of right shoulder, not elsewhere classified  Abnormal posture  Muscle weakness (generalized)  Right shoulder pain, unspecified chronicity  Other symptoms and signs involving the musculoskeletal system  Rationale for  Evaluation and Treatment: Rehabilitation  ONSET DATE: 06/12/23   SUBJECTIVE:                                                                                                                                                                                      SUBJECTIVE STATEMENT: Pt states she's been doing her HEP.    Hand dominance: Right  PERTINENT HISTORY: Per referral: Aggressive ROM; no restrictions or  limitations High blood pressure, COPD/asthma, osteopenia, CVA affecting legs and balance R hip surgery 06/12/22  PAIN:  Are you having pain? Yes: NPRS scale: 0 at rest, a little soreness with raising her arm up Pain location: Under R arm Pain description: pulling soreness Aggravating factors: lifting arm Relieving factors: rest  PRECAUTIONS: Fall  RED FLAGS: None   WEIGHT BEARING RESTRICTIONS: No  FALLS:  Has patient fallen in last 6 months? No  LIVING ENVIRONMENT: Lives with: lives with their daughter Lives in: House/apartment Stairs: No Has following equipment at home: Environmental consultant - 2 wheeled  OCCUPATION: Able to d  PLOF: Independent, able to dress herself upper body but needs some assist for lower body, needs assist for amb, able to bathe her upper body; able to do some laundry/fold clothes  PATIENT GOALS:  Improve movement, return to recreational activities, improve strength  NEXT MD VISIT:   OBJECTIVE:  Note: Objective measures were completed at Evaluation unless otherwise noted.  DIAGNOSTIC FINDINGS:  06/09/23 R shoulder x-ray IMPRESSION: Mildly displaced fracture through the right humeral neck and greater tuberosity.  06/09/23 R hip IMPRESSION: Fractures of right superior and inferior pubic rami, new since previous study.   No evidence of acute hip fracture or dislocation.   Osteopenia.  PATIENT SURVEYS:  QUICK DASH  Please rate your ability do the following activities in the last week by selecting the number below the appropriate response.   Activities Rating 10/10/23  Open a tight or new jar.  5 = Unable  Do heavy household chores (e.g., wash walls, floors). 5 = Unable  Carry a shopping bag or briefcase 5 = Unable  Wash your back. 3 = Moderate difficulty  Use a knife to cut food. 5 = Unable  Recreational activities in which you take some force or impact through your arm, shoulder or hand (e.g., golf, hammering, tennis, etc.). 5 = Unable  During the  past week, to what extent has your arm, shoulder or hand problem interfered with your normal social activities with family, friends, neighbors or groups?  4 = Quite a bit  During the past week, were you limited in your work or other regular daily activities as a result of your arm, shoulder or  hand problem? 4 = Very limited  Rate the severity of the following symptoms in the last week: Arm, Shoulder, or hand pain. 1 = none  Rate the severity of the following symptoms in the last week: Tingling (pins and needles) in your arm, shoulder or hand. 1 = none  During the past week, how much difficulty have you had sleeping because of the pain in your arm, shoulder or hand?  2 = Mild difficulty  Total score 65.9   (A QuickDASH score may not be calculated if there is greater than 1 missing item.)  Minimally Clinically Important Difference (MCID): 15-20 points  (Franchignoni, F. et al. (2013). Minimally clinically important difference of the disabilities of the arm, shoulder, and hand outcome measures (DASH) and its shortened version (Quick DASH). Journal of Orthopaedic & Sports Physical Therapy, 44(1), 30-39)   COGNITION: Overall cognitive status: Within functional limits for tasks assessed     SENSATION: WFL  POSTURE: Increased thoracic kyphosis, rounded shoulders, forward head  UPPER EXTREMITY ROM:    AROM/PROM Right eval Left eval  Shoulder flexion A: 75 P: 105 A: 140  Shoulder extension A: 35 A: 48  Shoulder abduction A: 65(scaption) P: 85 A: 120  Shoulder adduction    Shoulder internal rotation A: ~R PSIS A: ~T4  Shoulder external rotation A: -40 at 0 deg abd P: -22 at 0 deg abd A: 32 at 0 deg abd  Elbow flexion    Elbow extension    Wrist flexion    Wrist extension    Wrist ulnar deviation    Wrist radial deviation    Wrist pronation    Wrist supination    (Blank rows = not tested)  UPPER EXTREMITY MMT:  MMT Right eval Left eval  Shoulder flexion 3- 4  Shoulder  extension 3- 4  Shoulder abduction 3- 4  Shoulder adduction    Shoulder internal rotation 3- 4  Shoulder external rotation 2+ 4  Middle trapezius    Lower trapezius    Elbow flexion 5 5  Elbow extension 3+ 4  Wrist flexion    Wrist extension    Wrist ulnar deviation    Wrist radial deviation    Wrist pronation    Wrist supination    Grip strength (lbs)    (Blank rows = not tested)  SHOULDER SPECIAL TESTS: Did not assess  JOINT MOBILITY TESTING:  L glenohumeral joint hypomobile in all directions but mostly inferiorly and anteriorly L scapular hypomobility in all directions  PALPATION:  No overt tenderness to palpation                                                                                                                             TREATMENT DATE:  10-20-23  EXERCISE LOG    RT shldr  FX  Exercise Repetitions and Resistance Comments  Seated pulleys flexion and ER with elbow on table X3 min each   Gentle grade II to III glenohumeral mobs inferior, anterior and posterior    Shoulder PROM ER at 0 deg and then at 60 deg abd x10   Shoulder ER stretch low load at end range at 0 deg and then at 60 deg abd X 2-3 min   Shoulder PROM scaption x10   Shoulder scaption stretch low load at end range X 2-3 min   Supine shoulder ER iso against PT hand at 0 deg and then at 60 deg abd X10 each   Supine shoulder ER AAROM with PT assist at 0 deg and then at 60 deg abd X10 each   Supine shoulder scaption AAROM with PT assist x10   Amb with RW focusing on keeping R elbow tucked to keep from internal rotation          10-17-23                                    EXERCISE LOG    RT shldr  FX  Exercise Repetitions and Resistance Comments  Seated UE ranger X 6 mins  some AAROM for guidence   Pulleys X 6 mins  some assist with RT hand    ER isometrics 3x10 hold 5 secs            Blank cell = exercise not performed today  Reviewed HEP  Manual AAROM  for elevation and ER, as well as ER mm activation isometrics. Added to HEP ER isometrics using other hand or couch pillow IFC x 15 mins 80-120hz  with HMP seated RT shldr   10/10/23 See HEP below Manual therapy: grade II GH mobs in all directions, shoulder PROM all directions   PATIENT EDUCATION: Education details: Exam findings, POC, initial HEP Person educated: Patient and Child(ren) Education method: Explanation, Demonstration, and Handouts Education comprehension: verbalized understanding, returned demonstration, and needs further education  HOME EXERCISE PROGRAM: Access Code: [email or web address] URL: [email or web address] Date: 10/10/2023 Prepared by: Wilhelmena Zea April Earnie Starring  Exercises - Seated Shoulder External Rotation AAROM with [place] and Hand in Neutral  - 1 x daily - 7 x weekly - 2 sets - 10 reps - Standing Shoulder Internal Rotation AAROM with [place]  - 1 x daily - 7 x weekly - 2 sets - 10 reps - Seated Scapular Retraction  - 1 x daily - 7 x weekly - 2 sets - 10 reps - Seated Shoulder Horizontal Abduction - Thumbs Up  - 1 x daily - 7 x weekly - 2 sets - 10 reps  ASSESSMENT:  CLINICAL IMPRESSION: Continued to work on improving pt's shoulder ROM and posterior shoulder strength. Increased ER obtained today. Pt responded very well to low load, long stretches. Discussed with pt adding this to her HEP.   OBJECTIVE IMPAIRMENTS: decreased activity tolerance, decreased endurance, decreased mobility, decreased ROM, decreased strength, increased fascial restrictions, impaired flexibility, impaired UE functional use, improper body mechanics, postural dysfunction, and pain.   ACTIVITY LIMITATIONS: carrying, lifting, bathing, toileting, dressing, reach over head, and hygiene/grooming  PARTICIPATION LIMITATIONS: cleaning and laundry  PERSONAL FACTORS: Age, Fitness, Past/current experiences, and Time since onset of injury/illness/exacerbation are also affecting patient's functional  outcome.   REHAB POTENTIAL: Good  CLINICAL DECISION MAKING: Evolving/moderate complexity  EVALUATION COMPLEXITY: Moderate  GOALS: Goals reviewed with patient? Yes  SHORT TERM GOALS: Target date: 11/07/2023    Pt will be ind with initial HEP Baseline: Goal status: INITIAL  2.  Pt will be able to get shoulder ER to at least neutral at 0 deg of shoulder abd Baseline: 40 deg AROM away from neutral Goal status: INITIAL    LONG TERM GOALS: Target date: 12/05/2023    Pt will be ind with management and progression of HEP Baseline:  Goal status: INITIAL  2.  Pt will have improved shoulder flexion and abd to >/=120 deg and shoulder ER >/=30 deg actively for overhead reaching Baseline:  Goal status: INITIAL  3.  Pt will have improved QuickDASH to </=55.9 to demo MCID Baseline:  Goal status: INITIAL  4.  Pt will be able to lift and carry at least 2# to demo increasing L UE strength for self care tasks Baseline:  Goal status: INITIAL   PLAN:  PT FREQUENCY: 2x/week  PT DURATION: 8 weeks  PLANNED INTERVENTIONS: 97164- PT Re-evaluation, 97750- Physical Performance Testing, 97110-Therapeutic exercises, 97530- Therapeutic activity, 97112- Neuromuscular re-education, 97535- Self Care, 02859- Manual therapy, [id number]- Electrical stimulation (unattended), 97016- Vasopneumatic device, [id number]- Ultrasound, [id number]- Ionotophoresis 4mg /ml Dexamethasone , 79439 (1-2 muscles), 20561 (3+ muscles)- Dry Needling, Patient/Family education, Balance training, Taping, Joint mobilization, Spinal mobilization, Cryotherapy, and Moist heat  PLAN FOR NEXT SESSION: Assess response to HEP. Continue manual work for R shoulder joint mobilization, PROM/manual stretching. Progress AAROM exercises. Initiate strengthening.    Allister Lessley April Ma L Zyniah Ferraiolo, PT 10/20/2023, 11:04 AM

## 2023-10-26 ENCOUNTER — Ambulatory Visit

## 2023-10-26 DIAGNOSIS — M25511 Pain in right shoulder: Secondary | ICD-10-CM

## 2023-10-26 DIAGNOSIS — M6281 Muscle weakness (generalized): Secondary | ICD-10-CM

## 2023-10-26 DIAGNOSIS — M25611 Stiffness of right shoulder, not elsewhere classified: Secondary | ICD-10-CM

## 2023-10-26 DIAGNOSIS — R293 Abnormal posture: Secondary | ICD-10-CM

## 2023-10-26 NOTE — Therapy (Signed)
 OUTPATIENT PHYSICAL THERAPY TREATMENT   Patient Name: Chelsea [name] MRN: [id number] DOB:[birth year], 78 y.o., female Today's Date: 10/26/2023  END OF SESSION:  PT End of Session - 10/26/23 1400     Visit Number 5    Number of Visits 16    Date for Recertification  12/05/23    Authorization Type [employer]    PT Start Time 1355           Past Medical History:  Diagnosis Date   Allergy    Anemia    Blind left eye    Cardiac arrest (HCC)    Carotid artery occlusion    Cataract    Depression    Diabetes mellitus without complication (HCC)    Hyperlipidemia    Hypertension    Oxygen  deficiency    Stroke [facility])    Past Surgical History:  Procedure Laterality Date   ABDOMINAL HYSTERECTOMY     ENDARTERECTOMY Left 12/03/2019   Procedure: LEFT CAROTID ENDARTERECTOMY;  Surgeon: Eliza Lonni RAMAN, MD;  Location: [facility] OR;  Service: Vascular;  Laterality: Left;   INTRAMEDULLARY (IM) NAIL INTERTROCHANTERIC Right 06/14/2022   Procedure: INTRAMEDULLARY (IM) NAIL INTERTROCHANTERIC, REPAIR OF RIGHT HIP FRACTURE;  Surgeon: Celena Sharper, MD;  Location: [facility] OR;  Service: Orthopedics;  Laterality: Right;   PATCH ANGIOPLASTY Left 12/03/2019   Procedure: PATCH ANGIOPLASTY Left Carotid Artery;  Surgeon: Eliza Lonni RAMAN, MD;  Location: [facility] OR;  Service: Vascular;  Laterality: Left;   Patient Active Problem List   Diagnosis Date Noted   Closed fracture of multiple pubic rami (HCC) 06/10/2023   Fall at home, initial encounter 06/09/2023   Anemia 08/13/2022   Ileus (HCC) 06/20/2022   Hyperkalemia 06/14/2022   PVD (peripheral vascular disease) 06/13/2022   Wheezing 06/13/2022   Hip fracture, right, closed, initial encounter (HCC) 06/12/2022   Fall 06/12/2022   Hyponatremia 10/07/2020   AKI (acute kidney injury) on CKD IV 10/07/2020   Acute kidney injury superimposed on CKD 10/07/2020   Dehydration with hyponatremia    Syncope 10/06/2020   History of CVA  (cerebrovascular accident) 07/10/2020   Pure hypercholesterolemia 07/10/2020   Type 2 diabetes mellitus with complication, without long-term current use of insulin  (HCC) 07/10/2020   Malnutrition of moderate degree 06/27/2020   CKD (chronic kidney disease), stage IV (HCC) 06/25/2020   CAP (community acquired pneumonia) 06/25/2020   Stenosis of left carotid artery 12/03/2019   Stroke [facility])    Pain    Acute ischemic right posterior cerebral artery (PCA) stroke (HCC) 11/09/2019   Essential hypertension    Controlled type 2 diabetes mellitus with hyperglycemia (HCC)    Postherpetic neuralgia    Controlled type 2 diabetes mellitus with hyperglycemia, without long-term current use of insulin  (HCC) 11/07/2019   Hypertensive urgency 11/07/2019   Stenosis of left internal carotid artery 11/07/2019   Intractable nausea and vomiting 11/07/2019   Nicotine dependence, cigarettes, uncomplicated 11/07/2019   HZV (herpes zoster virus) post herpetic neuralgia 11/07/2019   Weakness of right lower extremity 11/07/2019   CVA (cerebral vascular accident) (HCC) 11/07/2019    PCP: Jesus Elberta Gainer, FNP  REFERRING PROVIDER: Gerard Large, PA-C  REFERRING DIAG: s/p R proximal humerus fracture  THERAPY DIAG:  Stiffness of right shoulder, not elsewhere classified  Abnormal posture  Muscle weakness (generalized)  Right shoulder pain, unspecified chronicity  Rationale for Evaluation and Treatment: Rehabilitation  ONSET DATE: 06/12/23   SUBJECTIVE:  SUBJECTIVE STATEMENT: Pt states she's been doing her HEP.  Pt reports right shoulder soreness today.  Hand dominance: Right  PERTINENT HISTORY: Per referral: Aggressive ROM; no restrictions or limitations High blood pressure, COPD/asthma, osteopenia, CVA affecting legs  and balance R hip surgery 06/12/22  PAIN:  Are you having pain? Yes: NPRS scale: 0 at rest, a little soreness with raising her arm up Pain location: Under R arm Pain description: pulling soreness Aggravating factors: lifting arm Relieving factors: rest  PRECAUTIONS: Fall  RED FLAGS: None   WEIGHT BEARING RESTRICTIONS: No  FALLS:  Has patient fallen in last 6 months? No  LIVING ENVIRONMENT: Lives with: lives with their daughter Lives in: House/apartment Stairs: No Has following equipment at home: Environmental consultant - 2 wheeled  OCCUPATION: Able to d  PLOF: Independent, able to dress herself upper body but needs some assist for lower body, needs assist for amb, able to bathe her upper body; able to do some laundry/fold clothes  PATIENT GOALS:  Improve movement, return to recreational activities, improve strength  NEXT MD VISIT:   OBJECTIVE:  Note: Objective measures were completed at Evaluation unless otherwise noted.  DIAGNOSTIC FINDINGS:  06/09/23 R shoulder x-ray IMPRESSION: Mildly displaced fracture through the right humeral neck and greater tuberosity.  06/09/23 R hip IMPRESSION: Fractures of right superior and inferior pubic rami, new since previous study.   No evidence of acute hip fracture or dislocation.   Osteopenia.  PATIENT SURVEYS:  QUICK DASH  Please rate your ability do the following activities in the last week by selecting the number below the appropriate response.   Activities Rating 10/10/23  Open a tight or new jar.  5 = Unable  Do heavy household chores (e.g., wash walls, floors). 5 = Unable  Carry a shopping bag or briefcase 5 = Unable  Wash your back. 3 = Moderate difficulty  Use a knife to cut food. 5 = Unable  Recreational activities in which you take some force or impact through your arm, shoulder or hand (e.g., golf, hammering, tennis, etc.). 5 = Unable  During the past week, to what extent has your arm, shoulder or hand problem interfered with  your normal social activities with family, friends, neighbors or groups?  4 = Quite a bit  During the past week, were you limited in your work or other regular daily activities as a result of your arm, shoulder or hand problem? 4 = Very limited  Rate the severity of the following symptoms in the last week: Arm, Shoulder, or hand pain. 1 = none  Rate the severity of the following symptoms in the last week: Tingling (pins and needles) in your arm, shoulder or hand. 1 = none  During the past week, how much difficulty have you had sleeping because of the pain in your arm, shoulder or hand?  2 = Mild difficulty  Total score 65.9   (A QuickDASH score may not be calculated if there is greater than 1 missing item.)  Minimally Clinically Important Difference (MCID): 15-20 points  (Franchignoni, F. et al. (2013). Minimally clinically important difference of the disabilities of the arm, shoulder, and hand outcome measures (DASH) and its shortened version (Quick DASH). Journal of Orthopaedic & Sports Physical Therapy, 44(1), 30-39)   COGNITION: Overall cognitive status: Within functional limits for tasks assessed     SENSATION: WFL  POSTURE: Increased thoracic kyphosis, rounded shoulders, forward head  UPPER EXTREMITY ROM:    AROM/PROM Right eval Left eval  Shoulder flexion A:  75 P: 105 A: 140  Shoulder extension A: 35 A: 48  Shoulder abduction A: 65(scaption) P: 85 A: 120  Shoulder adduction    Shoulder internal rotation A: ~R PSIS A: ~T4  Shoulder external rotation A: -40 at 0 deg abd P: -22 at 0 deg abd A: 32 at 0 deg abd  Elbow flexion    Elbow extension    Wrist flexion    Wrist extension    Wrist ulnar deviation    Wrist radial deviation    Wrist pronation    Wrist supination    (Blank rows = not tested)  UPPER EXTREMITY MMT:  MMT Right eval Left eval  Shoulder flexion 3- 4  Shoulder extension 3- 4  Shoulder abduction 3- 4  Shoulder adduction    Shoulder internal  rotation 3- 4  Shoulder external rotation 2+ 4  Middle trapezius    Lower trapezius    Elbow flexion 5 5  Elbow extension 3+ 4  Wrist flexion    Wrist extension    Wrist ulnar deviation    Wrist radial deviation    Wrist pronation    Wrist supination    Grip strength (lbs)    (Blank rows = not tested)  SHOULDER SPECIAL TESTS: Did not assess  JOINT MOBILITY TESTING:  L glenohumeral joint hypomobile in all directions but mostly inferiorly and anteriorly L scapular hypomobility in all directions  PALPATION:  No overt tenderness to palpation                                                                                                                             TREATMENT DATE:    10/26/23                                  EXERCISE LOG  Exercise Repetitions and Resistance Comments  Pulleys 5 mins   UE Ranger 5 mins   ER isometrics 3 x 10 reps w 5 sec hold   AA Flexion Cane 2 sets of 10 reps        Blank cell = exercise not performed today   Manual Therapy Soft Tissue Mobilization: right shoulder, STW/M to right deltoid and bicep to decrease pain and tone   10-20-23                                    EXERCISE LOG    RT shldr  FX  Exercise Repetitions and Resistance Comments  Seated pulleys flexion and ER with elbow on table X3 min each   Gentle grade II to III glenohumeral mobs inferior, anterior and posterior    Shoulder PROM ER at 0 deg and then at 60 deg abd x10   Shoulder ER stretch low load at end range at 0 deg and then at 60 deg  abd X 2-3 min   Shoulder PROM scaption x10   Shoulder scaption stretch low load at end range X 2-3 min   Supine shoulder ER iso against PT hand at 0 deg and then at 60 deg abd X10 each   Supine shoulder ER AAROM with PT assist at 0 deg and then at 60 deg abd X10 each   Supine shoulder scaption AAROM with PT assist x10   Amb with RW focusing on keeping R elbow tucked to keep from internal rotation          10-17-23                                     EXERCISE LOG    RT shldr  FX  Exercise Repetitions and Resistance Comments  Seated UE ranger X 6 mins  some AAROM for guidence   Pulleys X 6 mins  some assist with RT hand    ER isometrics 3x10 hold 5 secs            Blank cell = exercise not performed today  Reviewed HEP  Manual AAROM for elevation and ER, as well as ER mm activation isometrics. Added to HEP ER isometrics using other hand or couch pillow IFC x 15 mins 80-120hz  with HMP seated RT shldr   10/10/23 See HEP below Manual therapy: grade II GH mobs in all directions, shoulder PROM all directions   PATIENT EDUCATION: Education details: Exam findings, POC, initial HEP Person educated: Patient and Child(ren) Education method: Explanation, Demonstration, and Handouts Education comprehension: verbalized understanding, returned demonstration, and needs further education  HOME EXERCISE PROGRAM: Access Code: [email or web address] URL: [email or web address] Date: 10/10/2023 Prepared by: Gellen April Earnie Starring  Exercises - Seated Shoulder External Rotation AAROM with [place] and Hand in Neutral  - 1 x daily - 7 x weekly - 2 sets - 10 reps - Standing Shoulder Internal Rotation AAROM with Dowel  - 1 x daily - 7 x weekly - 2 sets - 10 reps - Seated Scapular Retraction  - 1 x daily - 7 x weekly - 2 sets - 10 reps - Seated Shoulder Horizontal Abduction - Thumbs Up  - 1 x daily - 7 x weekly - 2 sets - 10 reps  ASSESSMENT:  CLINICAL IMPRESSION: Pt arrives for today's treatment session reporting right shoulder soreness.  Pt states that she is performing HEP at home and attributes soreness to exercises.  Reviewed previously performed exercises with addition of AA shoulder flexion with cane.  STW/M performed to right deltoid and bicep to decrease pain and tone.  Discussed use of heating pad at home to decrease soreness.  Pt reports feeling pretty good at completion of today's treatment session.   OBJECTIVE  IMPAIRMENTS: decreased activity tolerance, decreased endurance, decreased mobility, decreased ROM, decreased strength, increased fascial restrictions, impaired flexibility, impaired UE functional use, improper body mechanics, postural dysfunction, and pain.   ACTIVITY LIMITATIONS: carrying, lifting, bathing, toileting, dressing, reach over head, and hygiene/grooming  PARTICIPATION LIMITATIONS: cleaning and laundry  PERSONAL FACTORS: Age, Fitness, Past/current experiences, and Time since onset of injury/illness/exacerbation are also affecting patient's functional outcome.   REHAB POTENTIAL: Good  CLINICAL DECISION MAKING: Evolving/moderate complexity  EVALUATION COMPLEXITY: Moderate   GOALS: Goals reviewed with patient? Yes  SHORT TERM GOALS: Target date: 11/07/2023    Pt will be ind with initial HEP Baseline: Goal status: INITIAL  2.  Pt will  be able to get shoulder ER to at least neutral at 0 deg of shoulder abd Baseline: 40 deg AROM away from neutral Goal status: INITIAL    LONG TERM GOALS: Target date: 12/05/2023    Pt will be ind with management and progression of HEP Baseline:  Goal status: INITIAL  2.  Pt will have improved shoulder flexion and abd to >/=120 deg and shoulder ER >/=30 deg actively for overhead reaching Baseline:  Goal status: INITIAL  3.  Pt will have improved QuickDASH to </=55.9 to demo MCID Baseline:  Goal status: INITIAL  4.  Pt will be able to lift and carry at least 2# to demo increasing L UE strength for self care tasks Baseline:  Goal status: INITIAL   PLAN:  PT FREQUENCY: 2x/week  PT DURATION: 8 weeks  PLANNED INTERVENTIONS: 97164- PT Re-evaluation, 97750- Physical Performance Testing, 97110-Therapeutic exercises, 97530- Therapeutic activity, [id number]- Neuromuscular re-education, 97535- Self Care, 02859- Manual therapy, G0283- Electrical stimulation (unattended), 97016- Vasopneumatic device, [id number]- Ultrasound, [id number]-  Ionotophoresis 4mg /ml Dexamethasone , 79439 (1-2 muscles), 20561 (3+ muscles)- Dry Needling, Patient/Family education, Balance training, Taping, Joint mobilization, Spinal mobilization, Cryotherapy, and Moist heat  PLAN FOR NEXT SESSION: Assess response to HEP. Continue manual work for R shoulder joint mobilization, PROM/manual stretching. Progress AAROM exercises. Initiate strengthening.    Delon DELENA Gosling, PTA 10/26/2023, 2:55 PM

## 2023-10-27 DIAGNOSIS — S90821D Blister (nonthermal), right foot, subsequent encounter: Secondary | ICD-10-CM | POA: Diagnosis not present

## 2023-10-27 DIAGNOSIS — R238 Other skin changes: Secondary | ICD-10-CM | POA: Diagnosis not present

## 2023-10-27 DIAGNOSIS — R296 Repeated falls: Secondary | ICD-10-CM | POA: Diagnosis not present

## 2023-10-27 DIAGNOSIS — I509 Heart failure, unspecified: Secondary | ICD-10-CM | POA: Diagnosis not present

## 2023-10-27 DIAGNOSIS — J449 Chronic obstructive pulmonary disease, unspecified: Secondary | ICD-10-CM | POA: Diagnosis not present

## 2023-11-01 ENCOUNTER — Ambulatory Visit: Admitting: Physical Therapy

## 2023-11-03 ENCOUNTER — Ambulatory Visit: Admitting: *Deleted

## 2023-11-07 ENCOUNTER — Encounter (HOSPITAL_BASED_OUTPATIENT_CLINIC_OR_DEPARTMENT_OTHER): Payer: Self-pay

## 2023-11-10 ENCOUNTER — Ambulatory Visit (HOSPITAL_BASED_OUTPATIENT_CLINIC_OR_DEPARTMENT_OTHER): Admitting: Cardiology

## 2023-11-10 ENCOUNTER — Encounter (HOSPITAL_BASED_OUTPATIENT_CLINIC_OR_DEPARTMENT_OTHER): Payer: Self-pay | Admitting: Cardiology

## 2023-11-10 VITALS — BP 110/56 | HR 62 | Ht 67.0 in

## 2023-11-10 DIAGNOSIS — I6522 Occlusion and stenosis of left carotid artery: Secondary | ICD-10-CM

## 2023-11-10 DIAGNOSIS — I5032 Chronic diastolic (congestive) heart failure: Secondary | ICD-10-CM

## 2023-11-10 DIAGNOSIS — E7800 Pure hypercholesterolemia, unspecified: Secondary | ICD-10-CM | POA: Diagnosis not present

## 2023-11-10 DIAGNOSIS — R0989 Other specified symptoms and signs involving the circulatory and respiratory systems: Secondary | ICD-10-CM | POA: Diagnosis not present

## 2023-11-10 DIAGNOSIS — E118 Type 2 diabetes mellitus with unspecified complications: Secondary | ICD-10-CM

## 2023-11-10 DIAGNOSIS — N1832 Chronic kidney disease, stage 3b: Secondary | ICD-10-CM | POA: Diagnosis not present

## 2023-11-10 DIAGNOSIS — Z8673 Personal history of transient ischemic attack (TIA), and cerebral infarction without residual deficits: Secondary | ICD-10-CM

## 2023-11-10 NOTE — Progress Notes (Signed)
 Cardiology Office Note:  .    Date:  11/10/2023  ID:  Chelsea, [name] [birth year], MRN [id number] PCP: Jesus Elberta Gainer, FNP  [facility] Providers Cardiologist:  Chelsea Bruckner, MD     History of Present Illness: .    ODETTE [name] is a 78 y.o. female with a hx of CVA, cardiac arrest, carotid artery occlusion, diabetes mellitus without complication, hypertension, and stroke, who is seen for follow up today. I initially met her 06/18/20 as a new consult at the request of Jesus Elberta Gainer, FNP for the evaluation and management of uncontrolled hypertension.   Today: No recent falls since spring (second fall happened after I saw her last). Both were mechanical falls.   Has mild LE edema at ankles, sometimes better at the end of day when elevated. Usually no edema first thing in the morning. No shortness of breath. Urinates frequently on torsemide .   Saw Dr. Macel recently, recommended to stay off the Kerendia  as it wasn't making much difference per daughter.  Blood pressures have been largely stable. Working with PT.   Daughter and patient want to avoid insulin . We discussed goals of care. Discussed maybe getting CGM, having threshold for insulin  use if very high. They will discuss with PCP next week.  ROS:  Denies chest pain, shortness of breath at rest or with normal exertion. No PND, orthopnea, or unexpected weight gain. No syncope or palpitations. ROS otherwise negative except as noted.   Studies Reviewed: SABRA         Physical Exam:    VS:  BP (!) 110/56 (BP Location: Left Arm, Patient Position: Sitting, Cuff Size: Normal)   Pulse 62   Ht 5' 7 (1.702 m)   SpO2 97%   BMI 25.84 kg/m    Wt Readings from Last 3 Encounters:  08/09/23 165 lb (74.8 kg)  06/09/23 165 lb (74.8 kg)  12/02/22 153 lb 8 oz (69.6 kg)    GEN: Well nourished, well developed in no acute distress HEENT: Normal, moist mucous membranes NECK: No JVD CARDIAC: regular rhythm,  normal S1 and S2, no rubs or gallops. No murmur. VASCULAR: Radial and DP pulses 2+ bilaterally. No carotid bruits RESPIRATORY:  Clear to auscultation without rales, wheezing or rhonchi  ABDOMEN: Soft, non-tender, non-distended MUSCULOSKELETAL:  in wheelchair today, moves all 4 limbs independently. SKIN: Warm and dry, mild pitting edema at ankles R>L, doesn't extend up to calf NEUROLOGIC:  Alert and oriented x 3. No focal neuro deficits noted. PSYCHIATRIC:  Normal affect   ASSESSMENT AND PLAN: .    History of CVA Carotid stenosis, left Hypercholesterolemia -continue clopidogrel , no longer on DAPT -last LDL 51 07/2023, goal <70 -continue rosuvastatin  5 mg daily   Hypertension, labile -has a history of both very low and very high pressures, need to be cautious with med changes -no longer on amlodipine . Hydralazine  stopped previously. -continue olmesartan , carvedilol , torsemide  -kerendia  stopped, felt to be ineffective (per daughter's communication)   Type II diabetes Chronic kidney disease, stage 3b Chronic diastolic heart failure -mild chronic LE edema on torsemide  -see above re: Kerendia  -With frequent UTIs, SGLT2i not ideal. Would not use GLP1RA, as she had issues with lack of appetite/weight loss -was on insulin  in the hospital, patient and daughter want to avoid this, they check sugars at home and report that there have not been severe elevations -pending PCP visit/bloodwork next week  Dispo: Follow-up in 6 months, or sooner as needed.  Signed, Chelsea Bruckner, MD

## 2023-11-10 NOTE — Patient Instructions (Signed)
 Medication Instructions:  No changes *If you need a refill on your cardiac medications before your next appointment, please call your pharmacy*  Lab Work: none If you have labs (blood work) drawn today and your tests are completely normal, you will receive your results only by: MyChart Message (if you have MyChart) OR A paper copy in the mail If you have any lab test that is abnormal or we need to change your treatment, we will call you to review the results.  Testing/Procedures: none  Follow-Up: At [facility], you and your health needs are our priority.  As part of our continuing mission to provide you with exceptional heart care, our providers are all part of one team.  This team includes your primary Cardiologist (physician) and Advanced Practice Providers or APPs (Physician Assistants and Nurse Practitioners) who all work together to provide you with the care you need, when you need it.  Your next appointment:   6 month(s)  Provider:   Shelda Bruckner, MD, Rosaline Bane, NP, or Reche Finder, NP

## 2023-11-15 ENCOUNTER — Ambulatory Visit: Attending: Orthopedic Surgery

## 2023-11-15 DIAGNOSIS — E1165 Type 2 diabetes mellitus with hyperglycemia: Secondary | ICD-10-CM | POA: Diagnosis not present

## 2023-11-15 DIAGNOSIS — M25611 Stiffness of right shoulder, not elsewhere classified: Secondary | ICD-10-CM | POA: Diagnosis not present

## 2023-11-15 DIAGNOSIS — M25511 Pain in right shoulder: Secondary | ICD-10-CM | POA: Insufficient documentation

## 2023-11-15 DIAGNOSIS — M6281 Muscle weakness (generalized): Secondary | ICD-10-CM | POA: Insufficient documentation

## 2023-11-15 DIAGNOSIS — R293 Abnormal posture: Secondary | ICD-10-CM | POA: Diagnosis not present

## 2023-11-15 DIAGNOSIS — Z794 Long term (current) use of insulin: Secondary | ICD-10-CM | POA: Diagnosis not present

## 2023-11-15 NOTE — Therapy (Signed)
 OUTPATIENT PHYSICAL THERAPY TREATMENT   Patient Name: Chelsea [name] MRN: [id number] DOB:[birth year], 78 y.o., female Today's Date: 11/15/2023  END OF SESSION:  PT End of Session - 11/15/23 1514     Visit Number 6    Number of Visits 16    Date for Recertification  12/05/23    Authorization Type [employer]    PT Start Time 1432    PT Stop Time 1520    PT Time Calculation (min) 48 min           Past Medical History:  Diagnosis Date   Allergy    Anemia    Blind left eye    Cardiac arrest (HCC)    Carotid artery occlusion    Cataract    Depression    Diabetes mellitus without complication (HCC)    Hyperlipidemia    Hypertension    Oxygen  deficiency    Stroke [facility])    Past Surgical History:  Procedure Laterality Date   ABDOMINAL HYSTERECTOMY     ENDARTERECTOMY Left 12/03/2019   Procedure: LEFT CAROTID ENDARTERECTOMY;  Surgeon: Eliza Lonni RAMAN, MD;  Location: [facility] OR;  Service: Vascular;  Laterality: Left;   INTRAMEDULLARY (IM) NAIL INTERTROCHANTERIC Right 06/14/2022   Procedure: INTRAMEDULLARY (IM) NAIL INTERTROCHANTERIC, REPAIR OF RIGHT HIP FRACTURE;  Surgeon: Celena Sharper, MD;  Location: [facility] OR;  Service: Orthopedics;  Laterality: Right;   PATCH ANGIOPLASTY Left 12/03/2019   Procedure: PATCH ANGIOPLASTY Left Carotid Artery;  Surgeon: Eliza Lonni RAMAN, MD;  Location: [facility] OR;  Service: Vascular;  Laterality: Left;   Patient Active Problem List   Diagnosis Date Noted   Closed fracture of multiple pubic rami (HCC) 06/10/2023   Fall at home, initial encounter 06/09/2023   Anemia 08/13/2022   Ileus (HCC) 06/20/2022   Hyperkalemia 06/14/2022   PVD (peripheral vascular disease) 06/13/2022   Wheezing 06/13/2022   Hip fracture, right, closed, initial encounter (HCC) 06/12/2022   Fall 06/12/2022   Hyponatremia 10/07/2020   AKI (acute kidney injury) on CKD IV 10/07/2020   Acute kidney injury superimposed on CKD 10/07/2020   Dehydration with  hyponatremia    Syncope 10/06/2020   History of CVA (cerebrovascular accident) 07/10/2020   Pure hypercholesterolemia 07/10/2020   Type 2 diabetes mellitus with complication, without long-term current use of insulin  (HCC) 07/10/2020   Malnutrition of moderate degree 06/27/2020   CKD (chronic kidney disease), stage IV (HCC) 06/25/2020   CAP (community acquired pneumonia) 06/25/2020   Stenosis of left carotid artery 12/03/2019   Stroke [facility])    Pain    Acute ischemic right posterior cerebral artery (PCA) stroke (HCC) 11/09/2019   Essential hypertension    Controlled type 2 diabetes mellitus with hyperglycemia (HCC)    Postherpetic neuralgia    Controlled type 2 diabetes mellitus with hyperglycemia, without long-term current use of insulin  (HCC) 11/07/2019   Hypertensive urgency 11/07/2019   Stenosis of left internal carotid artery 11/07/2019   Intractable nausea and vomiting 11/07/2019   Nicotine dependence, cigarettes, uncomplicated 11/07/2019   HZV (herpes zoster virus) post herpetic neuralgia 11/07/2019   Weakness of right lower extremity 11/07/2019   CVA (cerebral vascular accident) (HCC) 11/07/2019    PCP: Jesus Elberta Gainer, FNP  REFERRING PROVIDER: Gerard Large, PA-C  REFERRING DIAG: s/p R proximal humerus fracture  THERAPY DIAG:  Stiffness of right shoulder, not elsewhere classified  Abnormal posture  Muscle weakness (generalized)  Right shoulder pain, unspecified chronicity  Rationale for Evaluation and Treatment: Rehabilitation  ONSET DATE: 06/12/23  SUBJECTIVE:                                                                                                                                                                                      SUBJECTIVE STATEMENT: Pt states she's been doing her HEP three times a day.  Pt reports that she had issues sleeping last night due to her right shoulder pain.  Pt reports 3/10 right shoulder pain today.   Hand dominance:  Right  PERTINENT HISTORY: Per referral: Aggressive ROM; no restrictions or limitations High blood pressure, COPD/asthma, osteopenia, CVA affecting legs and balance R hip surgery 06/12/22  PAIN:  Are you having pain? Yes: NPRS scale: 3/10 Pain location: Under R arm Pain description: pulling soreness Aggravating factors: lifting arm Relieving factors: rest  PRECAUTIONS: Fall  RED FLAGS: None   WEIGHT BEARING RESTRICTIONS: No  FALLS:  Has patient fallen in last 6 months? No  LIVING ENVIRONMENT: Lives with: lives with their daughter Lives in: House/apartment Stairs: No Has following equipment at home: Environmental Consultant - 2 wheeled  OCCUPATION: Able to d  PLOF: Independent, able to dress herself upper body but needs some assist for lower body, needs assist for amb, able to bathe her upper body; able to do some laundry/fold clothes  PATIENT GOALS:  Improve movement, return to recreational activities, improve strength  NEXT MD VISIT:   OBJECTIVE:  Note: Objective measures were completed at Evaluation unless otherwise noted.  DIAGNOSTIC FINDINGS:  06/09/23 R shoulder x-ray IMPRESSION: Mildly displaced fracture through the right humeral neck and greater tuberosity.  06/09/23 R hip IMPRESSION: Fractures of right superior and inferior pubic rami, new since previous study.   No evidence of acute hip fracture or dislocation.   Osteopenia.  PATIENT SURVEYS:  QUICK DASH  Please rate your ability do the following activities in the last week by selecting the number below the appropriate response.   Activities Rating 10/10/23  Open a tight or new jar.  5 = Unable  Do heavy household chores (e.g., wash walls, floors). 5 = Unable  Carry a shopping bag or briefcase 5 = Unable  Wash your back. 3 = Moderate difficulty  Use a knife to cut food. 5 = Unable  Recreational activities in which you take some force or impact through your arm, shoulder or hand (e.g., golf, hammering, tennis,  etc.). 5 = Unable  During the past week, to what extent has your arm, shoulder or hand problem interfered with your normal social activities with family, friends, neighbors or groups?  4 = Quite a bit  During the past week, were you limited in your work or other regular daily activities as  a result of your arm, shoulder or hand problem? 4 = Very limited  Rate the severity of the following symptoms in the last week: Arm, Shoulder, or hand pain. 1 = none  Rate the severity of the following symptoms in the last week: Tingling (pins and needles) in your arm, shoulder or hand. 1 = none  During the past week, how much difficulty have you had sleeping because of the pain in your arm, shoulder or hand?  2 = Mild difficulty  Total score 65.9   (A QuickDASH score may not be calculated if there is greater than 1 missing item.)  Minimally Clinically Important Difference (MCID): 15-20 points  (Franchignoni, F. et al. (2013). Minimally clinically important difference of the disabilities of the arm, shoulder, and hand outcome measures (DASH) and its shortened version (Quick DASH). Journal of Orthopaedic & Sports Physical Therapy, 44(1), 30-39)   COGNITION: Overall cognitive status: Within functional limits for tasks assessed     SENSATION: WFL  POSTURE: Increased thoracic kyphosis, rounded shoulders, forward head  UPPER EXTREMITY ROM:    AROM/PROM Right eval Left eval  Shoulder flexion A: 75 P: 105 A: 140  Shoulder extension A: 35 A: 48  Shoulder abduction A: 65(scaption) P: 85 A: 120  Shoulder adduction    Shoulder internal rotation A: ~R PSIS A: ~T4  Shoulder external rotation A: -40 at 0 deg abd P: -22 at 0 deg abd A: 32 at 0 deg abd  Elbow flexion    Elbow extension    Wrist flexion    Wrist extension    Wrist ulnar deviation    Wrist radial deviation    Wrist pronation    Wrist supination    (Blank rows = not tested)  UPPER EXTREMITY MMT:  MMT Right eval Left eval  Shoulder  flexion 3- 4  Shoulder extension 3- 4  Shoulder abduction 3- 4  Shoulder adduction    Shoulder internal rotation 3- 4  Shoulder external rotation 2+ 4  Middle trapezius    Lower trapezius    Elbow flexion 5 5  Elbow extension 3+ 4  Wrist flexion    Wrist extension    Wrist ulnar deviation    Wrist radial deviation    Wrist pronation    Wrist supination    Grip strength (lbs)    (Blank rows = not tested)  SHOULDER SPECIAL TESTS: Did not assess  JOINT MOBILITY TESTING:  L glenohumeral joint hypomobile in all directions but mostly inferiorly and anteriorly L scapular hypomobility in all directions  PALPATION:  No overt tenderness to palpation                                                                                                                             TREATMENT DATE:   11/15/23  EXERCISE LOG  Exercise Repetitions and Resistance Comments  Pulleys 5 mins   UE Ranger 5 mins   ER isometrics 3 x 10 reps w 5 sec hold   AA Flexion Cane 2 sets of 10 reps        Blank cell = exercise not performed today   Manual Therapy Soft Tissue Mobilization: right shoulder, STW/M to right deltoid and bicep as well as posterior shoulder musculature to decrease pain and tone   10-20-23                                    EXERCISE LOG    RT shldr  FX  Exercise Repetitions and Resistance Comments  Seated pulleys flexion and ER with elbow on table X3 min each   Gentle grade II to III glenohumeral mobs inferior, anterior and posterior    Shoulder PROM ER at 0 deg and then at 60 deg abd x10   Shoulder ER stretch low load at end range at 0 deg and then at 60 deg abd X 2-3 min   Shoulder PROM scaption x10   Shoulder scaption stretch low load at end range X 2-3 min   Supine shoulder ER iso against PT hand at 0 deg and then at 60 deg abd X10 each   Supine shoulder ER AAROM with PT assist at 0 deg and then at 60 deg abd X10 each   Supine shoulder  scaption AAROM with PT assist x10   Amb with RW focusing on keeping R elbow tucked to keep from internal rotation          10-17-23                                    EXERCISE LOG    RT shldr  FX  Exercise Repetitions and Resistance Comments  Seated UE ranger X 6 mins  some AAROM for guidence   Pulleys X 6 mins  some assist with RT hand    ER isometrics 3x10 hold 5 secs            Blank cell = exercise not performed today  Reviewed HEP  Manual AAROM for elevation and ER, as well as ER mm activation isometrics. Added to HEP ER isometrics using other hand or couch pillow IFC x 15 mins 80-120hz  with HMP seated RT shldr   10/10/23 See HEP below Manual therapy: grade II GH mobs in all directions, shoulder PROM all directions   PATIENT EDUCATION: Education details: Exam findings, POC, initial HEP Person educated: Patient and Child(ren) Education method: Explanation, Demonstration, and Handouts Education comprehension: verbalized understanding, returned demonstration, and needs further education  HOME EXERCISE PROGRAM: Access Code: [email or web address] URL: [email or web address] Date: 10/10/2023 Prepared by: Gellen April Marie Nonato  Exercises - Seated Shoulder External Rotation AAROM with [place] and Hand in Neutral  - 1 x daily - 7 x weekly - 2 sets - 10 reps - Standing Shoulder Internal Rotation AAROM with Dowel  - 1 x daily - 7 x weekly - 2 sets - 10 reps - Seated Scapular Retraction  - 1 x daily - 7 x weekly - 2 sets - 10 reps - Seated Shoulder Horizontal Abduction - Thumbs Up  - 1 x daily - 7 x weekly - 2 sets - 10 reps  ASSESSMENT:  CLINICAL IMPRESSION: Pt  arrives for today's treatment session reporting 3/10 right shoulder pain.  Pt states that she is performing her HEP three times a day.  Pt states that she experiences increased right shoulder pain when performing STS transfers and during ambulation with RW.  Today's treatment session consisting of familiar exercises due  to pt not attending PT since 10/15.  Pt reports increased pain with all exercises today. STW/M performed to right posterior shoulder musculature as well as right deltoid and bicep.  Will assess pt's goals at next treatment session.  Pt denied any change in pain at completion of today's treatment session.   OBJECTIVE IMPAIRMENTS: decreased activity tolerance, decreased endurance, decreased mobility, decreased ROM, decreased strength, increased fascial restrictions, impaired flexibility, impaired UE functional use, improper body mechanics, postural dysfunction, and pain.   ACTIVITY LIMITATIONS: carrying, lifting, bathing, toileting, dressing, reach over head, and hygiene/grooming  PARTICIPATION LIMITATIONS: cleaning and laundry  PERSONAL FACTORS: Age, Fitness, Past/current experiences, and Time since onset of injury/illness/exacerbation are also affecting patient's functional outcome.   REHAB POTENTIAL: Good  CLINICAL DECISION MAKING: Evolving/moderate complexity  EVALUATION COMPLEXITY: Moderate   GOALS: Goals reviewed with patient? Yes  SHORT TERM GOALS: Target date: 11/07/2023  Pt will be ind with initial HEP Baseline: Goal status: INITIAL  2.  Pt will be able to get shoulder ER to at least neutral at 0 deg of shoulder abd Baseline: 40 deg AROM away from neutral Goal status: INITIAL  LONG TERM GOALS: Target date: 12/05/2023  Pt will be ind with management and progression of HEP Baseline:  Goal status: INITIAL  2.  Pt will have improved shoulder flexion and abd to >/=120 deg and shoulder ER >/=30 deg actively for overhead reaching Baseline:  Goal status: INITIAL  3.  Pt will have improved QuickDASH to </=55.9 to demo MCID Baseline:  Goal status: INITIAL  4.  Pt will be able to lift and carry at least 2# to demo increasing L UE strength for self care tasks Baseline:  Goal status: INITIAL   PLAN:  PT FREQUENCY: 2x/week  PT DURATION: 8 weeks  PLANNED INTERVENTIONS:  97164- PT Re-evaluation, 97750- Physical Performance Testing, 97110-Therapeutic exercises, 97530- Therapeutic activity, 97112- Neuromuscular re-education, 97535- Self Care, 02859- Manual therapy, G0283- Electrical stimulation (unattended), 97016- Vasopneumatic device, [id number]- Ultrasound, [id number]- Ionotophoresis 4mg /ml Dexamethasone , 79439 (1-2 muscles), 20561 (3+ muscles)- Dry Needling, Patient/Family education, Balance training, Taping, Joint mobilization, Spinal mobilization, Cryotherapy, and Moist heat  PLAN FOR NEXT SESSION: Assess response to HEP. Continue manual work for R shoulder joint mobilization, PROM/manual stretching. Progress AAROM exercises. Initiate strengthening.    Delon DELENA Gosling, PTA 11/15/2023, 3:29 PM

## 2023-11-16 ENCOUNTER — Ambulatory Visit: Admitting: Primary Care

## 2023-11-17 ENCOUNTER — Ambulatory Visit

## 2023-11-27 DIAGNOSIS — J449 Chronic obstructive pulmonary disease, unspecified: Secondary | ICD-10-CM | POA: Diagnosis not present

## 2023-11-27 DIAGNOSIS — I509 Heart failure, unspecified: Secondary | ICD-10-CM | POA: Diagnosis not present

## 2023-12-02 DIAGNOSIS — S42201D Unspecified fracture of upper end of right humerus, subsequent encounter for fracture with routine healing: Secondary | ICD-10-CM | POA: Diagnosis not present

## 2023-12-05 ENCOUNTER — Other Ambulatory Visit (HOSPITAL_BASED_OUTPATIENT_CLINIC_OR_DEPARTMENT_OTHER): Payer: Self-pay | Admitting: Cardiology

## 2023-12-09 ENCOUNTER — Encounter

## 2023-12-21 ENCOUNTER — Ambulatory Visit: Admitting: Primary Care

## 2023-12-21 ENCOUNTER — Encounter: Payer: Self-pay | Admitting: Primary Care

## 2023-12-21 VITALS — BP 122/62 | HR 66 | Temp 97.4°F | Ht 67.0 in | Wt 155.0 lb

## 2023-12-21 DIAGNOSIS — Z87891 Personal history of nicotine dependence: Secondary | ICD-10-CM | POA: Diagnosis not present

## 2023-12-21 DIAGNOSIS — R600 Localized edema: Secondary | ICD-10-CM | POA: Diagnosis not present

## 2023-12-21 DIAGNOSIS — N184 Chronic kidney disease, stage 4 (severe): Secondary | ICD-10-CM | POA: Diagnosis not present

## 2023-12-21 DIAGNOSIS — R0902 Hypoxemia: Secondary | ICD-10-CM

## 2023-12-21 DIAGNOSIS — E1122 Type 2 diabetes mellitus with diabetic chronic kidney disease: Secondary | ICD-10-CM | POA: Diagnosis not present

## 2023-12-21 DIAGNOSIS — I129 Hypertensive chronic kidney disease with stage 1 through stage 4 chronic kidney disease, or unspecified chronic kidney disease: Secondary | ICD-10-CM | POA: Diagnosis not present

## 2023-12-21 DIAGNOSIS — J449 Chronic obstructive pulmonary disease, unspecified: Secondary | ICD-10-CM

## 2023-12-21 DIAGNOSIS — N2581 Secondary hyperparathyroidism of renal origin: Secondary | ICD-10-CM | POA: Diagnosis not present

## 2023-12-21 DIAGNOSIS — G4734 Idiopathic sleep related nonobstructive alveolar hypoventilation: Secondary | ICD-10-CM

## 2023-12-21 DIAGNOSIS — I639 Cerebral infarction, unspecified: Secondary | ICD-10-CM | POA: Diagnosis not present

## 2023-12-21 MED ORDER — FLUTICASONE-SALMETEROL 250-50 MCG/ACT IN AEPB: 1.0000 | INHALATION_SPRAY | Freq: Two times a day (BID) | RESPIRATORY_TRACT | 11 refills | Status: AC

## 2023-12-21 MED ORDER — FLUTICASONE-SALMETEROL 250-50 MCG/ACT IN AEPB: 1.0000 | INHALATION_SPRAY | Freq: Two times a day (BID) | RESPIRATORY_TRACT | 11 refills | Status: DC

## 2023-12-21 NOTE — Progress Notes (Unsigned)
 @Patient  ID: Chelsea [name], female    DOB: 11/21/1945, 78 y.o.   MRN: [id number]  Chief Complaint  Patient presents with   Medical Management of Chronic Issues    Needs to re certify for nocturnal o2. Uses 2l o2 HS    Referring provider: Jesus Elberta Gainer, FNP  HPI: 78 year old former smoker. PMH significant for HTN, stroke, CAP, type 2 diabetes, herpes zoster virus, CKD, anemia.   Previous [facility] pulmonary encounter: 12/02/22- Dr. Neda Patient with COPD  Has been doing relatively well with [facility], uses a nebulizer twice a day  Has been doing very well with no significant concerns at present Occasional cough Was using Mucinex  regularly but has not been coughing much  Compliant with inhaler  She does try to do her chores, Gets short of breath with significant exertion  Snoring, witnessed breath-hold by daughters had a sleep study performed in 2022 that was negative for significant sleep apnea-had a WatchPAT.  She feels rested in the morning Uses oxygen  supplementation at night Sleeps with her mouth open sometimes Multiple awakenings at night to use the bathroom  Was recently hospitalized and went to her nursing facility for rehab Accidental fall breaking her hip  History of hypertension, heart failure, diabetes, hypercholesterolemia, prior CVA, chronic kidney disease  Uses a flutter daily, not coughing much  Ambulates with a walker, she does have some blisters on her feet but are still healing, this limits her activities     12/21/2023- Interim hx  Discussed the use of AI scribe software for clinical note transcription with the patient, who gave verbal consent to proceed.  History of Present Illness Chelsea [name] is a 78 year old female with COPD who presents for recertification of nocturnal oxygen  use.  She has been using nocturnal oxygen  since her diagnosis of COPD. Her last visit was in November of the previous year for a chronic cough, which is  currently not bothersome.  She has a history of COPD and was previously on Breo. Currently, she uses Wixela (generic Advair) once a day, although it is prescribed for twice daily use. No issues with breathing, shortness of breath, wheezing, or chest tightness are reported. She uses a flutter valve and an incentive spirometer to help with lung function and congestion. She quit smoking in 2020.  She experiences multiple awakenings at night to use the bathroom and sometimes sleeps with her mouth open. There is no history of sleep apnea, although her daughter notes she sometimes stops breathing at night. She ambulates with a walker.  Her current medications include Wixela (fluticasone  salmeterol) one puff once a day.   Allergies  Allergen Reactions   Codeine Anaphylaxis and Swelling    Throat swells     Latex Anaphylaxis   Penicillins Anaphylaxis and Swelling    Throat closes   Tolerated Cephalosporin Date: 06/14/22.     Sulfa Antibiotics Anaphylaxis, Other (See Comments) and Swelling    Lips and eye swell   Atorvastatin  Diarrhea and Nausea And Vomiting   Benzodiazepines Hives   Cephalosporins Other (See Comments)    Reaction type/severity unknown - Tolerated Cephalosporin Date: 06/14/22.     Gabapentin Nausea And Vomiting    Other Reaction(s): GI Intolerance   Lidocaine  Hives   Oxycodone-Acetaminophen  Palpitations   Pregabalin  Diarrhea and Nausea And Vomiting   Trazodone Other (See Comments)    Sweating, dizziness     There is no immunization history on file for this patient.  Past Medical History:  Diagnosis Date   Allergy    Anemia    Blind left eye    Cardiac arrest (HCC)    Carotid artery occlusion    Cataract    Depression    Diabetes mellitus without complication (HCC)    Hyperlipidemia    Hypertension    Oxygen  deficiency    Stroke (HCC)     Tobacco History: Social History   Tobacco Use  Smoking Status Former   Types: Cigarettes  Smokeless Tobacco  Never   Counseling given: Not Answered   Outpatient Medications Prior to Visit  Medication Sig Dispense Refill   acetaminophen  (TYLENOL ) 325 MG tablet Take 1-2 tablets (325-650 mg total) by mouth every 6 (six) hours as needed for mild pain (pain score 1-3 or temp > 100.5). 60 tablet 0   albuterol  (VENTOLIN  HFA) 108 (90 Base) MCG/ACT inhaler Inhale 2 puffs into the lungs every 4 (four) hours as needed.     carvedilol  (COREG ) 3.125 MG tablet Take 1 tablet (3.125 mg total) by mouth 2 (two) times daily with a meal. 180 tablet 2   clopidogrel  (PLAVIX ) 75 MG tablet TAKE ONE TABLET DAILY 90 tablet 3   Docusate Sodium  (STOOL SOFTENER) 100 MG capsule Take 1-2 tablets (100-200 mg total) by mouth 2 (two) times daily. Take 200mg  in the morning and 100mg  in the evening for a total daily dose of 300mg      guaiFENesin  (MUCINEX ) 600 MG 12 hr tablet Take 1 tablet (600 mg total) by mouth 2 (two) times daily. 14 tablet 0   ipratropium-albuterol  (DUONEB) 0.5-2.5 (3) MG/3ML SOLN Inhale 3 mLs into the lungs every 4 (four) hours as needed. 360 mL 2   olmesartan  (BENICAR ) 20 MG tablet Take 1 tablet (20 mg total) by mouth daily. 90 tablet 1   polyethylene glycol powder (GLYCOLAX /MIRALAX ) 17 GM/SCOOP powder Take 17 g by mouth daily. 238 g 0   rosuvastatin  (CRESTOR ) 5 MG tablet TAKE ONE TABLET DAILY 90 tablet 2   tamsulosin  (FLOMAX ) 0.4 MG CAPS capsule Take 0.4 mg by mouth daily.     torsemide  (DEMADEX ) 20 MG tablet Take 1 tablet (20 mg total) by mouth daily. Additional doses as per nephrology instructions. 4530 tablet 2   fluticasone -salmeterol (ADVAIR) 250-50 MCG/ACT AEPB INHALE 1 PUFF 2 TIMES A DAY. RINSE MOUTH AFTER USE. 60 each 0   No facility-administered medications prior to visit.   Review of Systems  Review of Systems  Constitutional: Negative.   HENT:  Positive for postnasal drip.   Respiratory:  Positive for cough. Negative for shortness of breath and wheezing.    Physical Exam  BP 122/62   Pulse 66    Temp (!) 97.4 F (36.3 C)   Ht 5' 7 (1.702 m) Comment: pt stated  Wt 155 lb (70.3 kg)   SpO2 95% Comment: ra  BMI 24.28 kg/m  Physical Exam Constitutional:      Appearance: Normal appearance. She is well-developed.  HENT:     Head: Normocephalic and atraumatic.     Mouth/Throat:     Mouth: Mucous membranes are moist.     Pharynx: Oropharynx is clear. Posterior oropharyngeal erythema present.  Eyes:     Pupils: Pupils are equal, round, and reactive to light.  Cardiovascular:     Rate and Rhythm: Normal rate and regular rhythm.     Heart sounds: Normal heart sounds. No murmur heard.    Comments: Trace RL edema Pulmonary:     Effort: Pulmonary effort is normal. No  respiratory distress.     Breath sounds: Normal breath sounds. No wheezing or rhonchi.     Comments: CTA Musculoskeletal:        General: Normal range of motion.     Cervical back: Normal range of motion and neck supple.  Skin:    General: Skin is warm and dry.     Findings: No erythema or rash.  Neurological:     General: No focal deficit present.     Mental Status: She is alert and oriented to person, place, and time. Mental status is at baseline.  Psychiatric:        Mood and Affect: Mood normal.        Behavior: Behavior normal.        Thought Content: Thought content normal.        Judgment: Judgment normal.      Lab Results:  CBC    Component Value Date/Time   WBC 9.0 06/14/2023 0503   RBC 2.37 (L) 06/14/2023 0503   HGB 7.6 (L) 06/14/2023 0503   HGB 9.6 (L) 07/21/2020 1522   HCT 23.5 (L) 06/14/2023 0503   HCT 30.4 (L) 07/21/2020 1522   PLT 210 06/14/2023 0503   PLT 325 07/21/2020 1522   MCV 99.2 06/14/2023 0503   MCV 95 07/21/2020 1522   MCH 32.1 06/14/2023 0503   MCHC 32.3 06/14/2023 0503   RDW 12.6 06/14/2023 0503   RDW 12.5 07/21/2020 1522   LYMPHSABS 2.4 09/17/2022 1205   MONOABS 0.6 09/17/2022 1205   EOSABS 0.3 09/17/2022 1205   BASOSABS 0.0 09/17/2022 1205    BMET     Component Value Date/Time   NA 137 06/14/2023 0503   NA 138 07/21/2020 1519   K 5.1 06/14/2023 0503   CL 109 06/14/2023 0503   CO2 25 06/14/2023 0503   GLUCOSE 215 (H) 06/14/2023 0503   BUN 27 (H) 06/14/2023 0503   BUN 30 (H) 07/21/2020 1519   CREATININE 1.62 (H) 06/14/2023 0503   CALCIUM  8.3 (L) 06/14/2023 0503   GFRNONAA 32 (L) 06/14/2023 0503   GFRAA  09/17/2008 1612    >60        The eGFR has been calculated using the MDRD equation. This calculation has not been validated in all clinical situations. eGFR's persistently <60 mL/min signify possible Chronic Kidney Disease.    BNP No results found for: BNP  ProBNP No results found for: PROBNP  Imaging: No results found.   Assessment & Plan:    Assessment and Plan Assessment & Plan Nocturnal hypoxemia Patient wears 2L oxygen  at night. Requiring recertification for nocturnal oxygen  therapy. Nocturnal oxygen  use is necessary to maintain adequate oxygen  levels during sleep. No sleep apnea, but multiple awakenings at night to use the bathroom may affect test results. - Ordered overnight oximetry test to assess nocturnal oxygen  levels without supplemental oxygen . - Instructed to ensure four hours of data collection during sleep for qualification for nocturnal oxygen  therapy.  Chronic obstructive pulmonary disease (COPD) COPD, former smoker, quit in 2020. Currently on Advair (Wixela) inhaler, one puff twice a day. No current issues with breathing, shortness of breath, wheezing, or chest tightness. Regular use of flutter valve and incentive spirometer for congestion management. No color change or blood in mucus.  - Continue Advair (Wixela) inhaler, one puff twice a day. - Refilled inhaler with eleven refills to last approximately one year.  \\   Almarie LELON Ferrari, NP 12/26/2023

## 2023-12-21 NOTE — Patient Instructions (Addendum)
°  VISIT SUMMARY: Today, you came in for a recertification of your nocturnal oxygen  use. We discussed your current COPD management and your need for continued nocturnal oxygen  therapy. You reported no issues with breathing, shortness of breath, wheezing, or chest tightness. We also reviewed your medication and lung function management techniques.  YOUR PLAN: -NOCTURNAL HYPOXEMIA: Nocturnal hypoxemia means that your oxygen  levels drop during sleep. To ensure you continue to receive the necessary nocturnal oxygen  therapy, we have ordered an overnight oximetry test to assess your oxygen  levels without supplemental oxygen . Please ensure you collect at least four hours of data during sleep for this test. Continue to wear oxygen  at night until testing comes back.   -CHRONIC OBSTRUCTIVE PULMONARY DISEASE (COPD): COPD is a chronic lung condition that makes it hard to breathe. You are currently managing it with the Advair (Wixela) inhaler, which you should use one puff twice a day. Continue using your flutter valve and incentive spirometer to help with lung function and congestion. We have refilled your inhaler prescription with enough refills to last approximately one year.  INSTRUCTIONS: Please complete the overnight oximetry test as instructed, ensuring at least four hours of data collection during sleep. Continue using your Advair (Wixela) inhaler as prescribed, one puff twice a day. Follow up with us  if you experience any new or worsening symptoms.  Orders: ONO on Room air  Follow-up 6 months with Dr. Neda or sooner if needed/ send mychart message after completing ONO

## 2024-01-17 ENCOUNTER — Encounter: Payer: Self-pay | Admitting: Primary Care

## 2024-01-18 ENCOUNTER — Ambulatory Visit: Payer: Self-pay | Admitting: Primary Care

## 2024-01-18 DIAGNOSIS — G4734 Idiopathic sleep related nonobstructive alveolar hypoventilation: Secondary | ICD-10-CM

## 2024-01-19 ENCOUNTER — Encounter (HOSPITAL_COMMUNITY)

## 2024-01-19 ENCOUNTER — Ambulatory Visit

## 2024-02-03 ENCOUNTER — Ambulatory Visit (HOSPITAL_COMMUNITY): Attending: Physician Assistant

## 2024-02-03 ENCOUNTER — Ambulatory Visit

## 2024-06-20 ENCOUNTER — Ambulatory Visit: Admitting: Pulmonary Disease
